# Patient Record
Sex: Female | Born: 1935
Health system: Southern US, Community
[De-identification: ages and names within clinical notes are randomized; demographics above are authoritative.]

## PROBLEM LIST (undated history)

## (undated) DIAGNOSIS — I4891 Unspecified atrial fibrillation: Secondary | ICD-10-CM

## (undated) DIAGNOSIS — K279 Peptic ulcer, site unspecified, unspecified as acute or chronic, without hemorrhage or perforation: Secondary | ICD-10-CM

## (undated) DIAGNOSIS — E538 Deficiency of other specified B group vitamins: Secondary | ICD-10-CM

## (undated) DIAGNOSIS — F329 Major depressive disorder, single episode, unspecified: Secondary | ICD-10-CM

## (undated) DIAGNOSIS — M19049 Primary osteoarthritis, unspecified hand: Secondary | ICD-10-CM

## (undated) DIAGNOSIS — Z9889 Other specified postprocedural states: Secondary | ICD-10-CM

## (undated) DIAGNOSIS — J189 Pneumonia, unspecified organism: Secondary | ICD-10-CM

## (undated) DIAGNOSIS — F419 Anxiety disorder, unspecified: Secondary | ICD-10-CM

## (undated) DIAGNOSIS — R112 Nausea with vomiting, unspecified: Secondary | ICD-10-CM

## (undated) DIAGNOSIS — J449 Chronic obstructive pulmonary disease, unspecified: Secondary | ICD-10-CM

## (undated) DIAGNOSIS — E559 Vitamin D deficiency, unspecified: Secondary | ICD-10-CM

## (undated) DIAGNOSIS — I10 Essential (primary) hypertension: Secondary | ICD-10-CM

## (undated) DIAGNOSIS — M1990 Unspecified osteoarthritis, unspecified site: Secondary | ICD-10-CM

## (undated) DIAGNOSIS — F32A Depression, unspecified: Secondary | ICD-10-CM

## (undated) DIAGNOSIS — K219 Gastro-esophageal reflux disease without esophagitis: Secondary | ICD-10-CM

## (undated) HISTORY — DX: Deficiency of other specified B group vitamins: E53.8

## (undated) HISTORY — DX: Pneumonia, unspecified organism: J18.9

## (undated) HISTORY — DX: Chronic obstructive pulmonary disease, unspecified: J44.9

## (undated) HISTORY — PX: BLADDER SURGERY: SHX569

## (undated) HISTORY — DX: Depression, unspecified: F32.A

## (undated) HISTORY — DX: Unspecified atrial fibrillation: I48.91

## (undated) HISTORY — DX: Anxiety disorder, unspecified: F41.9

## (undated) HISTORY — DX: Gastro-esophageal reflux disease without esophagitis: K21.9

## (undated) HISTORY — DX: Primary osteoarthritis, unspecified hand: M19.049

## (undated) HISTORY — PX: SPLENECTOMY: SUR1306

## (undated) HISTORY — PX: CYSTOCELE REPAIR: SHX163

## (undated) HISTORY — DX: Essential (primary) hypertension: I10

## (undated) HISTORY — DX: Vitamin D deficiency, unspecified: E55.9

## (undated) HISTORY — DX: Major depressive disorder, single episode, unspecified: F32.9

## (undated) HISTORY — DX: Unspecified osteoarthritis, unspecified site: M19.90

## (undated) HISTORY — DX: Peptic ulcer, site unspecified, unspecified as acute or chronic, without hemorrhage or perforation: K27.9

---

## 1998-07-17 ENCOUNTER — Emergency Department (HOSPITAL_COMMUNITY): Admission: EM | Admit: 1998-07-17 | Discharge: 1998-07-17 | Payer: Self-pay | Admitting: Emergency Medicine

## 1998-07-18 ENCOUNTER — Encounter: Payer: Self-pay | Admitting: Emergency Medicine

## 2001-04-25 ENCOUNTER — Ambulatory Visit (HOSPITAL_COMMUNITY): Admission: RE | Admit: 2001-04-25 | Discharge: 2001-04-25 | Payer: Self-pay | Admitting: Gastroenterology

## 2002-01-12 ENCOUNTER — Emergency Department (HOSPITAL_COMMUNITY): Admission: EM | Admit: 2002-01-12 | Discharge: 2002-01-12 | Payer: Self-pay | Admitting: Emergency Medicine

## 2002-08-01 ENCOUNTER — Inpatient Hospital Stay (HOSPITAL_COMMUNITY): Admission: AD | Admit: 2002-08-01 | Discharge: 2002-08-04 | Payer: Self-pay | Admitting: Internal Medicine

## 2002-08-14 ENCOUNTER — Encounter: Payer: Self-pay | Admitting: Emergency Medicine

## 2002-08-14 ENCOUNTER — Observation Stay (HOSPITAL_COMMUNITY): Admission: EM | Admit: 2002-08-14 | Discharge: 2002-08-15 | Payer: Self-pay | Admitting: Emergency Medicine

## 2002-10-21 ENCOUNTER — Encounter (INDEPENDENT_AMBULATORY_CARE_PROVIDER_SITE_OTHER): Payer: Self-pay

## 2002-10-21 ENCOUNTER — Inpatient Hospital Stay (HOSPITAL_COMMUNITY): Admission: RE | Admit: 2002-10-21 | Discharge: 2002-10-26 | Payer: Self-pay | Admitting: General Surgery

## 2003-09-19 ENCOUNTER — Emergency Department (HOSPITAL_COMMUNITY): Admission: EM | Admit: 2003-09-19 | Discharge: 2003-09-19 | Payer: Self-pay | Admitting: Family Medicine

## 2003-09-28 ENCOUNTER — Emergency Department (HOSPITAL_COMMUNITY): Admission: EM | Admit: 2003-09-28 | Discharge: 2003-09-28 | Payer: Self-pay | Admitting: Family Medicine

## 2004-02-02 ENCOUNTER — Ambulatory Visit: Payer: Self-pay | Admitting: Cardiology

## 2004-02-05 ENCOUNTER — Ambulatory Visit: Payer: Self-pay | Admitting: Internal Medicine

## 2004-02-09 ENCOUNTER — Ambulatory Visit (HOSPITAL_COMMUNITY): Admission: RE | Admit: 2004-02-09 | Discharge: 2004-02-09 | Payer: Self-pay | Admitting: Internal Medicine

## 2004-02-15 ENCOUNTER — Ambulatory Visit: Payer: Self-pay

## 2004-03-11 ENCOUNTER — Ambulatory Visit: Payer: Self-pay | Admitting: Internal Medicine

## 2004-05-09 ENCOUNTER — Ambulatory Visit: Payer: Self-pay | Admitting: Oncology

## 2004-07-01 ENCOUNTER — Ambulatory Visit: Payer: Self-pay | Admitting: Internal Medicine

## 2004-07-04 ENCOUNTER — Ambulatory Visit: Payer: Self-pay | Admitting: Internal Medicine

## 2004-08-08 ENCOUNTER — Ambulatory Visit: Payer: Self-pay | Admitting: Oncology

## 2004-11-04 ENCOUNTER — Ambulatory Visit: Payer: Self-pay | Admitting: Oncology

## 2004-11-15 ENCOUNTER — Ambulatory Visit: Payer: Self-pay | Admitting: Gastroenterology

## 2004-11-21 ENCOUNTER — Encounter: Admission: RE | Admit: 2004-11-21 | Discharge: 2004-11-21 | Payer: Self-pay | Admitting: *Deleted

## 2004-11-29 ENCOUNTER — Ambulatory Visit: Payer: Self-pay | Admitting: Internal Medicine

## 2005-01-10 ENCOUNTER — Ambulatory Visit: Payer: Self-pay | Admitting: Internal Medicine

## 2005-01-15 ENCOUNTER — Emergency Department (HOSPITAL_COMMUNITY): Admission: EM | Admit: 2005-01-15 | Discharge: 2005-01-15 | Payer: Self-pay | Admitting: *Deleted

## 2005-01-23 ENCOUNTER — Ambulatory Visit: Payer: Self-pay | Admitting: Internal Medicine

## 2005-01-25 ENCOUNTER — Ambulatory Visit (HOSPITAL_COMMUNITY): Admission: RE | Admit: 2005-01-25 | Discharge: 2005-01-25 | Payer: Self-pay | Admitting: Internal Medicine

## 2005-01-30 ENCOUNTER — Ambulatory Visit: Payer: Self-pay | Admitting: Internal Medicine

## 2005-01-30 ENCOUNTER — Inpatient Hospital Stay (HOSPITAL_COMMUNITY): Admission: AD | Admit: 2005-01-30 | Discharge: 2005-02-02 | Payer: Self-pay | Admitting: Internal Medicine

## 2005-01-31 ENCOUNTER — Ambulatory Visit: Payer: Self-pay | Admitting: Gastroenterology

## 2005-01-31 ENCOUNTER — Ambulatory Visit: Payer: Self-pay | Admitting: Internal Medicine

## 2005-02-01 ENCOUNTER — Encounter (INDEPENDENT_AMBULATORY_CARE_PROVIDER_SITE_OTHER): Payer: Self-pay | Admitting: Specialist

## 2005-02-06 ENCOUNTER — Ambulatory Visit: Payer: Self-pay | Admitting: Oncology

## 2005-02-13 ENCOUNTER — Ambulatory Visit: Payer: Self-pay | Admitting: Internal Medicine

## 2005-03-07 ENCOUNTER — Ambulatory Visit: Payer: Self-pay | Admitting: Gastroenterology

## 2005-03-16 ENCOUNTER — Ambulatory Visit: Payer: Self-pay | Admitting: Cardiology

## 2005-04-11 ENCOUNTER — Ambulatory Visit: Payer: Self-pay | Admitting: Internal Medicine

## 2005-05-08 ENCOUNTER — Ambulatory Visit: Payer: Self-pay | Admitting: Oncology

## 2005-05-29 ENCOUNTER — Ambulatory Visit (HOSPITAL_COMMUNITY): Admission: RE | Admit: 2005-05-29 | Discharge: 2005-05-29 | Payer: Self-pay | Admitting: Occupational Medicine

## 2005-05-29 ENCOUNTER — Encounter: Admission: RE | Admit: 2005-05-29 | Discharge: 2005-05-29 | Payer: Self-pay | Admitting: Occupational Medicine

## 2005-05-30 ENCOUNTER — Ambulatory Visit: Payer: Self-pay | Admitting: Gastroenterology

## 2005-06-15 ENCOUNTER — Other Ambulatory Visit: Admission: RE | Admit: 2005-06-15 | Discharge: 2005-06-15 | Payer: Self-pay | Admitting: Obstetrics and Gynecology

## 2005-07-14 ENCOUNTER — Ambulatory Visit: Payer: Self-pay | Admitting: Gynecology

## 2005-07-17 ENCOUNTER — Ambulatory Visit: Payer: Self-pay | Admitting: Cardiology

## 2005-08-29 ENCOUNTER — Encounter (INDEPENDENT_AMBULATORY_CARE_PROVIDER_SITE_OTHER): Payer: Self-pay | Admitting: *Deleted

## 2005-08-29 ENCOUNTER — Ambulatory Visit: Payer: Self-pay | Admitting: Gynecology

## 2005-08-29 ENCOUNTER — Inpatient Hospital Stay (HOSPITAL_COMMUNITY): Admission: RE | Admit: 2005-08-29 | Discharge: 2005-08-31 | Payer: Self-pay | Admitting: Gynecology

## 2005-08-29 ENCOUNTER — Ambulatory Visit: Payer: Self-pay | Admitting: Physician Assistant

## 2005-09-03 ENCOUNTER — Ambulatory Visit: Payer: Self-pay | Admitting: Emergency Medicine

## 2005-09-03 ENCOUNTER — Inpatient Hospital Stay (HOSPITAL_COMMUNITY): Admission: EM | Admit: 2005-09-03 | Discharge: 2005-09-26 | Payer: Self-pay | Admitting: Emergency Medicine

## 2005-09-03 ENCOUNTER — Ambulatory Visit: Payer: Self-pay | Admitting: Gastroenterology

## 2005-09-03 ENCOUNTER — Ambulatory Visit: Payer: Self-pay | Admitting: Internal Medicine

## 2005-09-08 ENCOUNTER — Encounter: Payer: Self-pay | Admitting: Cardiology

## 2005-09-12 ENCOUNTER — Ambulatory Visit: Payer: Self-pay | Admitting: Gastroenterology

## 2005-09-25 ENCOUNTER — Ambulatory Visit: Payer: Self-pay | Admitting: Physical Medicine & Rehabilitation

## 2005-10-24 ENCOUNTER — Ambulatory Visit: Payer: Self-pay | Admitting: Internal Medicine

## 2005-11-10 ENCOUNTER — Ambulatory Visit: Payer: Self-pay | Admitting: Gynecology

## 2005-11-15 ENCOUNTER — Ambulatory Visit: Payer: Self-pay | Admitting: Internal Medicine

## 2005-12-19 ENCOUNTER — Ambulatory Visit: Payer: Self-pay | Admitting: Internal Medicine

## 2006-01-19 ENCOUNTER — Ambulatory Visit: Payer: Self-pay | Admitting: Internal Medicine

## 2006-02-09 ENCOUNTER — Ambulatory Visit: Payer: Self-pay | Admitting: Internal Medicine

## 2006-02-16 ENCOUNTER — Ambulatory Visit: Payer: Self-pay | Admitting: Internal Medicine

## 2006-03-16 ENCOUNTER — Ambulatory Visit: Payer: Self-pay | Admitting: Cardiology

## 2006-03-16 LAB — CONVERTED CEMR LAB: Pro B Natriuretic peptide (BNP): 46 pg/mL (ref 0.0–100.0)

## 2006-03-28 ENCOUNTER — Ambulatory Visit: Payer: Self-pay | Admitting: Internal Medicine

## 2006-03-28 LAB — CONVERTED CEMR LAB
Bilirubin Urine: NEGATIVE
Hemoglobin, Urine: NEGATIVE
Ketones, ur: NEGATIVE mg/dL
Leukocytes, UA: NEGATIVE
Nitrite: NEGATIVE
Specific Gravity, Urine: 1.02 (ref 1.000–1.03)
Total Protein, Urine: NEGATIVE mg/dL
Urine Glucose: NEGATIVE mg/dL
Urobilinogen, UA: 0.2 (ref 0.0–1.0)
pH: 6 (ref 5.0–8.0)

## 2006-05-24 ENCOUNTER — Ambulatory Visit: Payer: Self-pay | Admitting: Internal Medicine

## 2006-07-24 ENCOUNTER — Emergency Department (HOSPITAL_COMMUNITY): Admission: EM | Admit: 2006-07-24 | Discharge: 2006-07-24 | Payer: Self-pay | Admitting: Family Medicine

## 2006-08-01 ENCOUNTER — Ambulatory Visit: Payer: Self-pay | Admitting: Internal Medicine

## 2006-08-07 DIAGNOSIS — J449 Chronic obstructive pulmonary disease, unspecified: Secondary | ICD-10-CM | POA: Insufficient documentation

## 2006-09-12 ENCOUNTER — Ambulatory Visit: Payer: Self-pay | Admitting: Internal Medicine

## 2006-09-12 LAB — CONVERTED CEMR LAB
ALT: 13 units/L (ref 0–35)
AST: 20 units/L (ref 0–37)
Albumin: 3.4 g/dL — ABNORMAL LOW (ref 3.5–5.2)
Alkaline Phosphatase: 61 units/L (ref 39–117)
BUN: 17 mg/dL (ref 6–23)
Basophils Absolute: 0 10*3/uL (ref 0.0–0.1)
Basophils Relative: 0.3 % (ref 0.0–1.0)
Bilirubin, Direct: 0.1 mg/dL (ref 0.0–0.3)
CO2: 25 meq/L (ref 19–32)
Calcium: 9.1 mg/dL (ref 8.4–10.5)
Carbamazepine Lvl: <0.3 ug/mL — ABNORMAL LOW (ref 4.0–12.0)
Chloride: 116 meq/L — ABNORMAL HIGH (ref 96–112)
Creatinine, Ser: 0.9 mg/dL (ref 0.4–1.2)
Eosinophils Absolute: 0.3 10*3/uL (ref 0.0–0.6)
Eosinophils Relative: 2.2 % (ref 0.0–5.0)
GFR calc Af Amer: 80 mL/min
GFR calc non Af Amer: 66 mL/min
Glucose, Bld: 100 mg/dL — ABNORMAL HIGH (ref 70–99)
HCT: 39.7 % (ref 36.0–46.0)
Hemoglobin: 13.4 g/dL (ref 12.0–15.0)
Lymphocytes Relative: 22.6 % (ref 12.0–46.0)
MCHC: 33.7 g/dL (ref 30.0–36.0)
MCV: 92.9 fL (ref 78.0–100.0)
Monocytes Absolute: 1.1 10*3/uL — ABNORMAL HIGH (ref 0.2–0.7)
Monocytes Relative: 9 % (ref 3.0–11.0)
Neutro Abs: 7.9 10*3/uL — ABNORMAL HIGH (ref 1.4–7.7)
Neutrophils Relative %: 65.9 % (ref 43.0–77.0)
Platelets: 496 10*3/uL — ABNORMAL HIGH (ref 150–400)
Potassium: 4 meq/L (ref 3.5–5.1)
RBC: 4.27 M/uL (ref 3.87–5.11)
RDW: 15.5 % — ABNORMAL HIGH (ref 11.5–14.6)
Sodium: 141 meq/L (ref 135–145)
TSH: 0.7 microintl units/mL (ref 0.35–5.50)
Theophylline Lvl: 0.6 ug/mL — ABNORMAL LOW (ref 10.0–20.0)
Total Bilirubin: 0.4 mg/dL (ref 0.3–1.2)
Total Protein: 6.3 g/dL (ref 6.0–8.3)
Vit D, 1,25-Dihydroxy: 27 (ref 20–57)
WBC: 12 10*3/uL — ABNORMAL HIGH (ref 4.5–10.5)

## 2006-10-19 ENCOUNTER — Ambulatory Visit: Payer: Self-pay | Admitting: Internal Medicine

## 2006-11-26 ENCOUNTER — Ambulatory Visit: Payer: Self-pay | Admitting: Internal Medicine

## 2007-02-18 ENCOUNTER — Encounter: Payer: Self-pay | Admitting: Internal Medicine

## 2007-02-19 ENCOUNTER — Encounter: Payer: Self-pay | Admitting: Internal Medicine

## 2007-02-19 DIAGNOSIS — E559 Vitamin D deficiency, unspecified: Secondary | ICD-10-CM | POA: Insufficient documentation

## 2007-02-19 DIAGNOSIS — K219 Gastro-esophageal reflux disease without esophagitis: Secondary | ICD-10-CM | POA: Insufficient documentation

## 2007-02-26 ENCOUNTER — Ambulatory Visit: Payer: Self-pay | Admitting: Internal Medicine

## 2007-02-26 DIAGNOSIS — I4891 Unspecified atrial fibrillation: Secondary | ICD-10-CM

## 2007-02-26 DIAGNOSIS — I4819 Other persistent atrial fibrillation: Secondary | ICD-10-CM | POA: Insufficient documentation

## 2007-02-26 DIAGNOSIS — I480 Paroxysmal atrial fibrillation: Secondary | ICD-10-CM | POA: Insufficient documentation

## 2007-02-26 LAB — CONVERTED CEMR LAB
ALT: 14 units/L (ref 0–35)
AST: 23 units/L (ref 0–37)
Albumin: 3.8 g/dL (ref 3.5–5.2)
Alkaline Phosphatase: 62 units/L (ref 39–117)
BUN: 16 mg/dL (ref 6–23)
Bilirubin, Direct: 0.2 mg/dL (ref 0.0–0.3)
CO2: 25 meq/L (ref 19–32)
Calcium: 9.3 mg/dL (ref 8.4–10.5)
Chloride: 105 meq/L (ref 96–112)
Cholesterol: 182 mg/dL (ref 0–200)
Creatinine, Ser: 0.8 mg/dL (ref 0.4–1.2)
GFR calc Af Amer: 91 mL/min
GFR calc non Af Amer: 76 mL/min
Glucose, Bld: 121 mg/dL — ABNORMAL HIGH (ref 70–99)
HDL: 46 mg/dL (ref >39.0)
LDL Cholesterol: 117 mg/dL — ABNORMAL HIGH (ref 0–99)
Potassium: 4.2 meq/L (ref 3.5–5.1)
Sodium: 140 meq/L (ref 135–145)
TSH: 0.97 microintl units/mL (ref 0.35–5.50)
Total Bilirubin: 0.5 mg/dL (ref 0.3–1.2)
Total CHOL/HDL Ratio: 4
Total Protein: 6.1 g/dL (ref 6.0–8.3)
Triglycerides: 96 mg/dL (ref 0–149)
VLDL: 19 mg/dL (ref 0–40)

## 2007-03-01 ENCOUNTER — Ambulatory Visit: Payer: Self-pay | Admitting: Internal Medicine

## 2007-03-01 DIAGNOSIS — F411 Generalized anxiety disorder: Secondary | ICD-10-CM | POA: Insufficient documentation

## 2007-03-01 DIAGNOSIS — F329 Major depressive disorder, single episode, unspecified: Secondary | ICD-10-CM | POA: Insufficient documentation

## 2007-04-03 ENCOUNTER — Ambulatory Visit: Payer: Self-pay | Admitting: Cardiology

## 2007-05-20 ENCOUNTER — Encounter: Payer: Self-pay | Admitting: Internal Medicine

## 2007-05-27 ENCOUNTER — Encounter: Admission: RE | Admit: 2007-05-27 | Discharge: 2007-05-27 | Payer: Self-pay | Admitting: Internal Medicine

## 2007-06-20 ENCOUNTER — Encounter: Payer: Self-pay | Admitting: Internal Medicine

## 2007-07-31 ENCOUNTER — Ambulatory Visit: Payer: Self-pay | Admitting: Internal Medicine

## 2007-07-31 DIAGNOSIS — G4700 Insomnia, unspecified: Secondary | ICD-10-CM | POA: Insufficient documentation

## 2007-08-12 ENCOUNTER — Encounter: Payer: Self-pay | Admitting: Internal Medicine

## 2007-08-20 ENCOUNTER — Telehealth: Payer: Self-pay | Admitting: Internal Medicine

## 2007-08-26 ENCOUNTER — Telehealth (INDEPENDENT_AMBULATORY_CARE_PROVIDER_SITE_OTHER): Payer: Self-pay | Admitting: *Deleted

## 2007-09-12 ENCOUNTER — Telehealth: Payer: Self-pay | Admitting: Internal Medicine

## 2007-10-02 ENCOUNTER — Telehealth: Payer: Self-pay | Admitting: Internal Medicine

## 2007-10-04 ENCOUNTER — Telehealth (INDEPENDENT_AMBULATORY_CARE_PROVIDER_SITE_OTHER): Payer: Self-pay | Admitting: *Deleted

## 2007-10-09 ENCOUNTER — Ambulatory Visit: Payer: Self-pay | Admitting: Internal Medicine

## 2007-10-11 LAB — CONVERTED CEMR LAB
ALT: 14 units/L (ref 0–35)
AST: 24 units/L (ref 0–37)
Albumin: 3.5 g/dL (ref 3.5–5.2)
Alkaline Phosphatase: 70 units/L (ref 39–117)
BUN: 14 mg/dL (ref 6–23)
Basophils Absolute: 0 10*3/uL (ref 0.0–0.1)
Basophils Relative: 0 % (ref 0.0–3.0)
Bilirubin, Direct: 0.1 mg/dL (ref 0.0–0.3)
CO2: 25 meq/L (ref 19–32)
Calcium: 9.3 mg/dL (ref 8.4–10.5)
Chloride: 107 meq/L (ref 96–112)
Creatinine, Ser: 0.8 mg/dL (ref 0.4–1.2)
Eosinophils Absolute: 0.4 10*3/uL (ref 0.0–0.7)
Eosinophils Relative: 4.8 % (ref 0.0–5.0)
Folate: 8.2 ng/mL
GFR calc Af Amer: 91 mL/min
GFR calc non Af Amer: 76 mL/min
Glucose, Bld: 70 mg/dL (ref 70–99)
HCT: 37.8 % (ref 36.0–46.0)
Hemoglobin: 12.7 g/dL (ref 12.0–15.0)
Lymphocytes Relative: 30.9 % (ref 12.0–46.0)
MCHC: 33.6 g/dL (ref 30.0–36.0)
MCV: 92.6 fL (ref 78.0–100.0)
Monocytes Absolute: 0.9 10*3/uL (ref 0.1–1.0)
Monocytes Relative: 9.9 % (ref 3.0–12.0)
Neutro Abs: 4.8 10*3/uL (ref 1.4–7.7)
Neutrophils Relative %: 54.4 % (ref 43.0–77.0)
Platelets: 401 10*3/uL — ABNORMAL HIGH (ref 150–400)
Potassium: 4.1 meq/L (ref 3.5–5.1)
RBC: 4.08 M/uL (ref 3.87–5.11)
RDW: 14.2 % (ref 11.5–14.6)
Sodium: 147 meq/L — ABNORMAL HIGH (ref 135–145)
TSH: 2.79 microintl units/mL (ref 0.35–5.50)
Total Bilirubin: 0.5 mg/dL (ref 0.3–1.2)
Total Protein: 6.5 g/dL (ref 6.0–8.3)
Vitamin B-12: 189 pg/mL — ABNORMAL LOW (ref 211–911)
WBC: 8.9 10*3/uL (ref 4.5–10.5)

## 2007-10-14 ENCOUNTER — Ambulatory Visit: Payer: Self-pay | Admitting: Internal Medicine

## 2007-10-14 DIAGNOSIS — E538 Deficiency of other specified B group vitamins: Secondary | ICD-10-CM | POA: Insufficient documentation

## 2007-10-14 DIAGNOSIS — R5381 Other malaise: Secondary | ICD-10-CM | POA: Insufficient documentation

## 2007-11-28 ENCOUNTER — Ambulatory Visit: Payer: Self-pay | Admitting: Cardiology

## 2007-11-28 ENCOUNTER — Inpatient Hospital Stay (HOSPITAL_COMMUNITY): Admission: EM | Admit: 2007-11-28 | Discharge: 2007-11-29 | Payer: Self-pay | Admitting: Emergency Medicine

## 2007-12-09 ENCOUNTER — Ambulatory Visit: Payer: Self-pay | Admitting: Internal Medicine

## 2007-12-09 LAB — CONVERTED CEMR LAB: Vit D, 1,25-Dihydroxy: 42 (ref 30–89)

## 2007-12-10 ENCOUNTER — Ambulatory Visit: Payer: Self-pay | Admitting: Internal Medicine

## 2007-12-10 ENCOUNTER — Ambulatory Visit: Payer: Self-pay

## 2007-12-10 ENCOUNTER — Encounter: Payer: Self-pay | Admitting: Cardiology

## 2007-12-10 LAB — CONVERTED CEMR LAB
BUN: 17 mg/dL (ref 6–23)
CO2: 24 meq/L (ref 19–32)
Calcium: 8.7 mg/dL (ref 8.4–10.5)
Chloride: 110 meq/L (ref 96–112)
Creatinine, Ser: 0.8 mg/dL (ref 0.4–1.2)
GFR calc Af Amer: 91 mL/min
GFR calc non Af Amer: 76 mL/min
Glucose, Bld: 94 mg/dL (ref 70–99)
Potassium: 3.8 meq/L (ref 3.5–5.1)
Sodium: 148 meq/L — ABNORMAL HIGH (ref 135–145)
Vitamin B-12: >1500 pg/mL — ABNORMAL HIGH (ref 211–911)

## 2007-12-23 ENCOUNTER — Ambulatory Visit: Payer: Self-pay | Admitting: Internal Medicine

## 2007-12-23 DIAGNOSIS — H9190 Unspecified hearing loss, unspecified ear: Secondary | ICD-10-CM | POA: Insufficient documentation

## 2008-01-08 ENCOUNTER — Encounter: Payer: Self-pay | Admitting: Internal Medicine

## 2008-01-13 ENCOUNTER — Telehealth: Payer: Self-pay | Admitting: Internal Medicine

## 2008-01-17 ENCOUNTER — Ambulatory Visit: Payer: Self-pay | Admitting: Cardiology

## 2008-01-21 ENCOUNTER — Ambulatory Visit: Payer: Self-pay | Admitting: Cardiovascular Disease

## 2008-01-21 ENCOUNTER — Ambulatory Visit: Payer: Self-pay

## 2008-01-25 ENCOUNTER — Emergency Department (HOSPITAL_COMMUNITY): Admission: EM | Admit: 2008-01-25 | Discharge: 2008-01-25 | Payer: Self-pay | Admitting: Emergency Medicine

## 2008-01-29 ENCOUNTER — Ambulatory Visit: Payer: Self-pay | Admitting: Cardiovascular Disease

## 2008-02-06 ENCOUNTER — Ambulatory Visit: Payer: Self-pay | Admitting: Cardiology

## 2008-02-20 ENCOUNTER — Telehealth: Payer: Self-pay | Admitting: Internal Medicine

## 2008-03-05 ENCOUNTER — Telehealth: Payer: Self-pay | Admitting: Internal Medicine

## 2008-03-23 ENCOUNTER — Ambulatory Visit: Payer: Self-pay | Admitting: Internal Medicine

## 2008-03-23 DIAGNOSIS — R55 Syncope and collapse: Secondary | ICD-10-CM | POA: Insufficient documentation

## 2008-03-23 DIAGNOSIS — R634 Abnormal weight loss: Secondary | ICD-10-CM | POA: Insufficient documentation

## 2008-03-25 LAB — CONVERTED CEMR LAB
ALT: 15 units/L (ref 0–35)
AST: 24 units/L (ref 0–37)
Albumin: 3.6 g/dL (ref 3.5–5.2)
Alkaline Phosphatase: 64 units/L (ref 39–117)
BUN: 15 mg/dL (ref 6–23)
Bilirubin Urine: NEGATIVE
Bilirubin, Direct: 0.1 mg/dL (ref 0.0–0.3)
CO2: 29 meq/L (ref 19–32)
Calcium: 8.9 mg/dL (ref 8.4–10.5)
Chloride: 111 meq/L (ref 96–112)
Creatinine, Ser: 0.8 mg/dL (ref 0.4–1.2)
GFR calc Af Amer: 91 mL/min
GFR calc non Af Amer: 75 mL/min
Glucose, Bld: 88 mg/dL (ref 70–99)
Hemoglobin, Urine: NEGATIVE
Ketones, ur: NEGATIVE mg/dL
Leukocytes, UA: NEGATIVE
Nitrite: NEGATIVE
Potassium: 4.3 meq/L (ref 3.5–5.1)
Sodium: 147 meq/L — ABNORMAL HIGH (ref 135–145)
Specific Gravity, Urine: 1.025 (ref 1.000–1.03)
TSH: 0.86 microintl units/mL (ref 0.35–5.50)
Total Bilirubin: 0.7 mg/dL (ref 0.3–1.2)
Total Protein, Urine: NEGATIVE mg/dL
Total Protein: 6.5 g/dL (ref 6.0–8.3)
Urine Glucose: NEGATIVE mg/dL
Urobilinogen, UA: 0.2 (ref 0.0–1.0)
Vitamin B-12: 1115 pg/mL — ABNORMAL HIGH (ref 211–911)
pH: 5.5 (ref 5.0–8.0)

## 2008-03-31 ENCOUNTER — Telehealth (INDEPENDENT_AMBULATORY_CARE_PROVIDER_SITE_OTHER): Payer: Self-pay | Admitting: *Deleted

## 2008-04-01 ENCOUNTER — Ambulatory Visit: Payer: Self-pay | Admitting: Internal Medicine

## 2008-04-17 ENCOUNTER — Telehealth: Payer: Self-pay | Admitting: Internal Medicine

## 2008-04-19 ENCOUNTER — Emergency Department (HOSPITAL_COMMUNITY): Admission: EM | Admit: 2008-04-19 | Discharge: 2008-04-19 | Payer: Self-pay | Admitting: Emergency Medicine

## 2008-05-11 ENCOUNTER — Emergency Department (HOSPITAL_COMMUNITY): Admission: EM | Admit: 2008-05-11 | Discharge: 2008-05-11 | Payer: Self-pay | Admitting: Emergency Medicine

## 2008-05-25 ENCOUNTER — Encounter (INDEPENDENT_AMBULATORY_CARE_PROVIDER_SITE_OTHER): Payer: Self-pay | Admitting: *Deleted

## 2008-05-25 ENCOUNTER — Encounter: Payer: Self-pay | Admitting: Cardiology

## 2008-05-25 ENCOUNTER — Ambulatory Visit: Payer: Self-pay | Admitting: Internal Medicine

## 2008-05-25 DIAGNOSIS — R519 Headache, unspecified: Secondary | ICD-10-CM | POA: Insufficient documentation

## 2008-05-25 DIAGNOSIS — I498 Other specified cardiac arrhythmias: Secondary | ICD-10-CM | POA: Insufficient documentation

## 2008-05-25 DIAGNOSIS — R51 Headache: Secondary | ICD-10-CM | POA: Insufficient documentation

## 2008-05-26 LAB — CONVERTED CEMR LAB
ALT: 14 units/L (ref 0–35)
AST: 26 units/L (ref 0–37)
Albumin: 3.8 g/dL (ref 3.5–5.2)
Alkaline Phosphatase: 54 units/L (ref 39–117)
BUN: 17 mg/dL (ref 6–23)
Basophils Absolute: 0.1 10*3/uL (ref 0.0–0.1)
Basophils Relative: 0.9 % (ref 0.0–3.0)
Bilirubin Urine: NEGATIVE
Bilirubin, Direct: 0.1 mg/dL (ref 0.0–0.3)
CO2: 28 meq/L (ref 19–32)
Calcium: 9.1 mg/dL (ref 8.4–10.5)
Chloride: 111 meq/L (ref 96–112)
Creatinine, Ser: 0.8 mg/dL (ref 0.4–1.2)
Eosinophils Absolute: 0.3 10*3/uL (ref 0.0–0.7)
Eosinophils Relative: 3.6 % (ref 0.0–5.0)
GFR calc non Af Amer: 75.29 mL/min (ref >60)
Glucose, Bld: 85 mg/dL (ref 70–99)
HCT: 37.8 % (ref 36.0–46.0)
Hemoglobin, Urine: NEGATIVE
Hemoglobin: 12.6 g/dL (ref 12.0–15.0)
Ketones, ur: NEGATIVE mg/dL
Leukocytes, UA: NEGATIVE
Lymphocytes Relative: 24.3 % (ref 12.0–46.0)
Lymphs Abs: 2.1 10*3/uL (ref 0.7–4.0)
MCHC: 33.2 g/dL (ref 30.0–36.0)
MCV: 88.9 fL (ref 78.0–100.0)
Monocytes Absolute: 0.7 10*3/uL (ref 0.1–1.0)
Monocytes Relative: 7.9 % (ref 3.0–12.0)
Neutro Abs: 5.5 10*3/uL (ref 1.4–7.7)
Neutrophils Relative %: 63.3 % (ref 43.0–77.0)
Nitrite: NEGATIVE
Platelets: 417 10*3/uL — ABNORMAL HIGH (ref 150.0–400.0)
Potassium: 5.5 meq/L — ABNORMAL HIGH (ref 3.5–5.1)
RBC: 4.26 M/uL (ref 3.87–5.11)
RDW: 16.1 % — ABNORMAL HIGH (ref 11.5–14.6)
Sed Rate: 11 mm/hr (ref 0–22)
Sodium: 148 meq/L — ABNORMAL HIGH (ref 135–145)
Specific Gravity, Urine: >=1.03 (ref 1.000–1.030)
TSH: 1.13 microintl units/mL (ref 0.35–5.50)
Total Bilirubin: 0.5 mg/dL (ref 0.3–1.2)
Total Protein, Urine: NEGATIVE mg/dL
Total Protein: 6.6 g/dL (ref 6.0–8.3)
Urine Glucose: NEGATIVE mg/dL
Urobilinogen, UA: 0.2 (ref 0.0–1.0)
Vitamin B-12: 586 pg/mL (ref 211–911)
WBC: 8.7 10*3/uL (ref 4.5–10.5)
pH: 5 (ref 5.0–8.0)

## 2008-06-02 ENCOUNTER — Encounter: Admission: RE | Admit: 2008-06-02 | Discharge: 2008-06-02 | Payer: Self-pay | Admitting: Internal Medicine

## 2008-06-22 ENCOUNTER — Encounter: Payer: Self-pay | Admitting: Internal Medicine

## 2008-06-25 ENCOUNTER — Telehealth: Payer: Self-pay | Admitting: Cardiology

## 2008-06-29 ENCOUNTER — Ambulatory Visit: Payer: Self-pay | Admitting: Internal Medicine

## 2008-07-15 DIAGNOSIS — I10 Essential (primary) hypertension: Secondary | ICD-10-CM | POA: Insufficient documentation

## 2008-07-15 DIAGNOSIS — J189 Pneumonia, unspecified organism: Secondary | ICD-10-CM | POA: Insufficient documentation

## 2008-07-15 DIAGNOSIS — J438 Other emphysema: Secondary | ICD-10-CM | POA: Insufficient documentation

## 2008-07-15 DIAGNOSIS — K279 Peptic ulcer, site unspecified, unspecified as acute or chronic, without hemorrhage or perforation: Secondary | ICD-10-CM | POA: Insufficient documentation

## 2008-07-15 DIAGNOSIS — M129 Arthropathy, unspecified: Secondary | ICD-10-CM | POA: Insufficient documentation

## 2008-07-21 ENCOUNTER — Telehealth: Payer: Self-pay | Admitting: Internal Medicine

## 2008-07-21 ENCOUNTER — Ambulatory Visit: Payer: Self-pay | Admitting: Cardiology

## 2008-07-24 ENCOUNTER — Ambulatory Visit: Payer: Self-pay

## 2008-07-24 ENCOUNTER — Encounter: Payer: Self-pay | Admitting: Cardiology

## 2008-08-04 ENCOUNTER — Encounter: Payer: Self-pay | Admitting: *Deleted

## 2008-08-12 ENCOUNTER — Ambulatory Visit: Payer: Self-pay | Admitting: Cardiology

## 2008-08-12 ENCOUNTER — Telehealth: Payer: Self-pay | Admitting: Cardiology

## 2008-08-12 ENCOUNTER — Encounter: Payer: Self-pay | Admitting: Internal Medicine

## 2008-08-13 ENCOUNTER — Inpatient Hospital Stay (HOSPITAL_COMMUNITY): Admission: EM | Admit: 2008-08-13 | Discharge: 2008-08-13 | Payer: Self-pay | Admitting: Emergency Medicine

## 2008-08-18 ENCOUNTER — Telehealth: Payer: Self-pay | Admitting: Cardiology

## 2008-08-18 ENCOUNTER — Telehealth: Payer: Self-pay | Admitting: Internal Medicine

## 2008-08-19 ENCOUNTER — Ambulatory Visit: Payer: Self-pay | Admitting: Internal Medicine

## 2008-08-19 DIAGNOSIS — R197 Diarrhea, unspecified: Secondary | ICD-10-CM | POA: Insufficient documentation

## 2008-08-19 DIAGNOSIS — R110 Nausea: Secondary | ICD-10-CM | POA: Insufficient documentation

## 2008-08-19 LAB — CONVERTED CEMR LAB
BUN: 16 mg/dL (ref 6–23)
Basophils Absolute: 0.2 10*3/uL — ABNORMAL HIGH (ref 0.0–0.1)
Basophils Relative: 1.8 % (ref 0.0–3.0)
CO2: 34 meq/L — ABNORMAL HIGH (ref 19–32)
Calcium: 9.2 mg/dL (ref 8.4–10.5)
Chloride: 104 meq/L (ref 96–112)
Creatinine, Ser: 0.7 mg/dL (ref 0.4–1.2)
Eosinophils Absolute: 0.2 10*3/uL (ref 0.0–0.7)
Eosinophils Relative: 1.2 % (ref 0.0–5.0)
GFR calc non Af Amer: 87.77 mL/min (ref >60)
Glucose, Bld: 89 mg/dL (ref 70–99)
HCT: 37.3 % (ref 36.0–46.0)
Hemoglobin: 12.3 g/dL (ref 12.0–15.0)
Lipase: 22 units/L (ref 11.0–59.0)
Lymphocytes Relative: 28.1 % (ref 12.0–46.0)
Lymphs Abs: 3.9 10*3/uL (ref 0.7–4.0)
MCHC: 33 g/dL (ref 30.0–36.0)
MCV: 89.6 fL (ref 78.0–100.0)
Monocytes Absolute: 1.5 10*3/uL — ABNORMAL HIGH (ref 0.1–1.0)
Monocytes Relative: 10.6 % (ref 3.0–12.0)
Neutro Abs: 8 10*3/uL — ABNORMAL HIGH (ref 1.4–7.7)
Neutrophils Relative %: 58.3 % (ref 43.0–77.0)
Platelets: 412 10*3/uL — ABNORMAL HIGH (ref 150.0–400.0)
Potassium: 5 meq/L (ref 3.5–5.1)
RBC: 4.17 M/uL (ref 3.87–5.11)
RDW: 16.7 % — ABNORMAL HIGH (ref 11.5–14.6)
Sodium: 146 meq/L — ABNORMAL HIGH (ref 135–145)
TSH: 1.61 microintl units/mL (ref 0.35–5.50)
WBC: 13.8 10*3/uL — ABNORMAL HIGH (ref 4.5–10.5)

## 2008-08-21 ENCOUNTER — Encounter: Payer: Self-pay | Admitting: Internal Medicine

## 2008-08-21 ENCOUNTER — Telehealth: Payer: Self-pay | Admitting: Cardiology

## 2008-08-25 ENCOUNTER — Ambulatory Visit: Payer: Self-pay | Admitting: Cardiology

## 2008-09-01 ENCOUNTER — Ambulatory Visit: Payer: Self-pay | Admitting: Internal Medicine

## 2008-09-02 ENCOUNTER — Encounter (INDEPENDENT_AMBULATORY_CARE_PROVIDER_SITE_OTHER): Payer: Self-pay | Admitting: *Deleted

## 2008-09-09 ENCOUNTER — Encounter: Payer: Self-pay | Admitting: *Deleted

## 2008-09-21 ENCOUNTER — Encounter (INDEPENDENT_AMBULATORY_CARE_PROVIDER_SITE_OTHER): Payer: Self-pay | Admitting: *Deleted

## 2008-11-06 ENCOUNTER — Ambulatory Visit: Payer: Self-pay | Admitting: Internal Medicine

## 2008-11-06 DIAGNOSIS — J309 Allergic rhinitis, unspecified: Secondary | ICD-10-CM | POA: Insufficient documentation

## 2008-11-10 LAB — CONVERTED CEMR LAB
ALT: 13 units/L (ref 0–35)
AST: 26 units/L (ref 0–37)
Albumin: 3.5 g/dL (ref 3.5–5.2)
Alkaline Phosphatase: 62 units/L (ref 39–117)
BUN: 21 mg/dL (ref 6–23)
Basophils Absolute: 0.1 10*3/uL (ref 0.0–0.1)
Basophils Relative: 0.8 % (ref 0.0–3.0)
Bilirubin, Direct: 0.1 mg/dL (ref 0.0–0.3)
CO2: 26 meq/L (ref 19–32)
Calcium: 8.9 mg/dL (ref 8.4–10.5)
Chloride: 111 meq/L (ref 96–112)
Creatinine, Ser: 1.1 mg/dL (ref 0.4–1.2)
Eosinophils Absolute: 0.5 10*3/uL (ref 0.0–0.7)
Eosinophils Relative: 5.1 % — ABNORMAL HIGH (ref 0.0–5.0)
GFR calc non Af Amer: 52.07 mL/min (ref >60)
Glucose, Bld: 80 mg/dL (ref 70–99)
HCT: 35.3 % — ABNORMAL LOW (ref 36.0–46.0)
Hemoglobin: 11.5 g/dL — ABNORMAL LOW (ref 12.0–15.0)
Lymphocytes Relative: 24.1 % (ref 12.0–46.0)
Lymphs Abs: 2.5 10*3/uL (ref 0.7–4.0)
MCHC: 32.4 g/dL (ref 30.0–36.0)
MCV: 91.3 fL (ref 78.0–100.0)
Monocytes Absolute: 1 10*3/uL (ref 0.1–1.0)
Monocytes Relative: 9.5 % (ref 3.0–12.0)
Neutro Abs: 6.1 10*3/uL (ref 1.4–7.7)
Neutrophils Relative %: 60.5 % (ref 43.0–77.0)
Platelets: 432 10*3/uL — ABNORMAL HIGH (ref 150.0–400.0)
Potassium: 4.4 meq/L (ref 3.5–5.1)
RBC: 3.87 M/uL (ref 3.87–5.11)
RDW: 16.1 % — ABNORMAL HIGH (ref 11.5–14.6)
Sodium: 144 meq/L (ref 135–145)
TSH: 1.43 microintl units/mL (ref 0.35–5.50)
Total Bilirubin: 0.6 mg/dL (ref 0.3–1.2)
Total Protein: 6.8 g/dL (ref 6.0–8.3)
Vitamin B-12: 450 pg/mL (ref 211–911)
WBC: 10.2 10*3/uL (ref 4.5–10.5)

## 2008-11-19 ENCOUNTER — Ambulatory Visit: Payer: Self-pay | Admitting: Cardiology

## 2008-12-03 ENCOUNTER — Encounter: Admission: RE | Admit: 2008-12-03 | Discharge: 2008-12-03 | Payer: Self-pay | Admitting: Internal Medicine

## 2008-12-31 ENCOUNTER — Telehealth: Payer: Self-pay | Admitting: Internal Medicine

## 2009-01-15 ENCOUNTER — Ambulatory Visit: Payer: Self-pay | Admitting: Internal Medicine

## 2009-02-10 ENCOUNTER — Telehealth: Payer: Self-pay | Admitting: Internal Medicine

## 2009-03-01 ENCOUNTER — Emergency Department (HOSPITAL_COMMUNITY): Admission: EM | Admit: 2009-03-01 | Discharge: 2009-03-01 | Payer: Self-pay | Admitting: Emergency Medicine

## 2009-03-08 ENCOUNTER — Ambulatory Visit: Payer: Self-pay | Admitting: Internal Medicine

## 2009-03-08 DIAGNOSIS — Z87891 Personal history of nicotine dependence: Secondary | ICD-10-CM | POA: Insufficient documentation

## 2009-03-08 DIAGNOSIS — I889 Nonspecific lymphadenitis, unspecified: Secondary | ICD-10-CM | POA: Insufficient documentation

## 2009-03-22 ENCOUNTER — Telehealth: Payer: Self-pay | Admitting: Internal Medicine

## 2009-03-23 ENCOUNTER — Ambulatory Visit: Payer: Self-pay | Admitting: Internal Medicine

## 2009-04-26 ENCOUNTER — Telehealth: Payer: Self-pay | Admitting: Internal Medicine

## 2009-06-15 ENCOUNTER — Ambulatory Visit: Payer: Self-pay | Admitting: Internal Medicine

## 2009-06-15 LAB — CONVERTED CEMR LAB
ALT: 12 units/L (ref 0–35)
AST: 18 units/L (ref 0–37)
Albumin: 3.3 g/dL — ABNORMAL LOW (ref 3.5–5.2)
Alkaline Phosphatase: 72 units/L (ref 39–117)
BUN: 18 mg/dL (ref 6–23)
Basophils Absolute: 0.1 10*3/uL (ref 0.0–0.1)
Basophils Relative: 0.9 % (ref 0.0–3.0)
Bilirubin, Direct: 0 mg/dL (ref 0.0–0.3)
CO2: 28 meq/L (ref 19–32)
Calcium: 9.2 mg/dL (ref 8.4–10.5)
Chloride: 109 meq/L (ref 96–112)
Creatinine, Ser: 0.9 mg/dL (ref 0.4–1.2)
Eosinophils Absolute: 0.5 10*3/uL (ref 0.0–0.7)
Eosinophils Relative: 4.7 % (ref 0.0–5.0)
GFR calc non Af Amer: 65.52 mL/min (ref >60)
Glucose, Bld: 94 mg/dL (ref 70–99)
HCT: 36.8 % (ref 36.0–46.0)
Hemoglobin: 12.1 g/dL (ref 12.0–15.0)
Lymphocytes Relative: 18 % (ref 12.0–46.0)
Lymphs Abs: 2 10*3/uL (ref 0.7–4.0)
MCHC: 32.9 g/dL (ref 30.0–36.0)
MCV: 86.6 fL (ref 78.0–100.0)
Monocytes Absolute: 1.1 10*3/uL — ABNORMAL HIGH (ref 0.1–1.0)
Monocytes Relative: 9.8 % (ref 3.0–12.0)
Neutro Abs: 7.5 10*3/uL (ref 1.4–7.7)
Neutrophils Relative %: 66.6 % (ref 43.0–77.0)
Platelets: 453 10*3/uL — ABNORMAL HIGH (ref 150.0–400.0)
Potassium: 5.4 meq/L — ABNORMAL HIGH (ref 3.5–5.1)
RBC: 4.24 M/uL (ref 3.87–5.11)
RDW: 17.1 % — ABNORMAL HIGH (ref 11.5–14.6)
Sodium: 145 meq/L (ref 135–145)
TSH: 1.32 microintl units/mL (ref 0.35–5.50)
Total Bilirubin: 0.3 mg/dL (ref 0.3–1.2)
Total Protein: 6.9 g/dL (ref 6.0–8.3)
Vitamin B-12: 907 pg/mL (ref 211–911)
WBC: 11.3 10*3/uL — ABNORMAL HIGH (ref 4.5–10.5)

## 2009-06-22 ENCOUNTER — Ambulatory Visit: Payer: Self-pay | Admitting: Internal Medicine

## 2009-06-23 LAB — CONVERTED CEMR LAB
Hemoglobin, Urine: NEGATIVE
Ketones, ur: NEGATIVE mg/dL
Leukocytes, UA: NEGATIVE
Nitrite: NEGATIVE
Specific Gravity, Urine: >=1.03 (ref 1.000–1.030)
Urine Glucose: NEGATIVE mg/dL
Urobilinogen, UA: 0.2 (ref 0.0–1.0)
pH: 5 (ref 5.0–8.0)

## 2009-07-06 ENCOUNTER — Ambulatory Visit: Payer: Self-pay | Admitting: Internal Medicine

## 2009-07-06 DIAGNOSIS — M1990 Unspecified osteoarthritis, unspecified site: Secondary | ICD-10-CM | POA: Insufficient documentation

## 2009-07-30 ENCOUNTER — Telehealth: Payer: Self-pay | Admitting: Internal Medicine

## 2009-07-31 ENCOUNTER — Emergency Department (HOSPITAL_COMMUNITY): Admission: EM | Admit: 2009-07-31 | Discharge: 2009-07-31 | Payer: Self-pay | Admitting: Family Medicine

## 2009-08-03 ENCOUNTER — Ambulatory Visit: Payer: Self-pay | Admitting: Internal Medicine

## 2009-08-03 DIAGNOSIS — J209 Acute bronchitis, unspecified: Secondary | ICD-10-CM | POA: Insufficient documentation

## 2009-08-05 ENCOUNTER — Telehealth: Payer: Self-pay | Admitting: Internal Medicine

## 2009-08-06 ENCOUNTER — Encounter: Payer: Self-pay | Admitting: Internal Medicine

## 2009-08-06 ENCOUNTER — Telehealth: Payer: Self-pay | Admitting: Internal Medicine

## 2009-08-07 ENCOUNTER — Telehealth: Payer: Self-pay | Admitting: Family Medicine

## 2009-08-09 ENCOUNTER — Telehealth: Payer: Self-pay | Admitting: Internal Medicine

## 2009-08-18 ENCOUNTER — Telehealth: Payer: Self-pay | Admitting: Internal Medicine

## 2009-09-15 ENCOUNTER — Telehealth: Payer: Self-pay | Admitting: Cardiology

## 2009-09-23 ENCOUNTER — Ambulatory Visit: Payer: Self-pay | Admitting: Cardiology

## 2009-09-23 DIAGNOSIS — R0602 Shortness of breath: Secondary | ICD-10-CM | POA: Insufficient documentation

## 2009-10-11 ENCOUNTER — Ambulatory Visit: Payer: Self-pay | Admitting: Internal Medicine

## 2009-10-11 LAB — CONVERTED CEMR LAB
Anti Nuclear Antibody(ANA): NEGATIVE
BUN: 24 mg/dL — ABNORMAL HIGH (ref 6–23)
CO2: 28 meq/L (ref 19–32)
Calcium: 8.8 mg/dL (ref 8.4–10.5)
Chloride: 107 meq/L (ref 96–112)
Creatinine, Ser: 1 mg/dL (ref 0.4–1.2)
GFR calc non Af Amer: 60.04 mL/min (ref >60)
Glucose, Bld: 78 mg/dL (ref 70–99)
Potassium: 4.2 meq/L (ref 3.5–5.1)
Rhuematoid fact SerPl-aCnc: <20 intl units/mL (ref 0–20)
Sed Rate: 12 mm/hr (ref 0–22)
Sodium: 144 meq/L (ref 135–145)
TSH: 1.07 microintl units/mL (ref 0.35–5.50)

## 2009-10-14 ENCOUNTER — Ambulatory Visit: Payer: Self-pay | Admitting: Internal Medicine

## 2009-11-10 ENCOUNTER — Encounter: Admission: RE | Admit: 2009-11-10 | Discharge: 2009-11-10 | Payer: Self-pay | Admitting: Occupational Medicine

## 2009-11-11 ENCOUNTER — Telehealth: Payer: Self-pay | Admitting: Internal Medicine

## 2009-12-17 ENCOUNTER — Telehealth: Payer: Self-pay | Admitting: Internal Medicine

## 2009-12-22 ENCOUNTER — Encounter: Payer: Self-pay | Admitting: Internal Medicine

## 2009-12-29 ENCOUNTER — Telehealth: Payer: Self-pay | Admitting: Internal Medicine

## 2010-01-06 ENCOUNTER — Telehealth: Payer: Self-pay | Admitting: Internal Medicine

## 2010-01-07 ENCOUNTER — Ambulatory Visit: Payer: Self-pay | Admitting: Internal Medicine

## 2010-01-07 DIAGNOSIS — J019 Acute sinusitis, unspecified: Secondary | ICD-10-CM | POA: Insufficient documentation

## 2010-01-07 LAB — CONVERTED CEMR LAB
Basophils Absolute: 0.1 10*3/uL (ref 0.0–0.1)
Basophils Relative: 0.8 % (ref 0.0–3.0)
Eosinophils Absolute: 0.3 10*3/uL (ref 0.0–0.7)
Eosinophils Relative: 2.6 % (ref 0.0–5.0)
HCT: 35.9 % — ABNORMAL LOW (ref 36.0–46.0)
Hemoglobin: 11.8 g/dL — ABNORMAL LOW (ref 12.0–15.0)
Lymphocytes Relative: 27.3 % (ref 12.0–46.0)
Lymphs Abs: 3.1 10*3/uL (ref 0.7–4.0)
MCHC: 32.8 g/dL (ref 30.0–36.0)
MCV: 90.7 fL (ref 78.0–100.0)
Monocytes Absolute: 1.3 10*3/uL — ABNORMAL HIGH (ref 0.1–1.0)
Monocytes Relative: 10.9 % (ref 3.0–12.0)
Neutro Abs: 6.7 10*3/uL (ref 1.4–7.7)
Neutrophils Relative %: 58.4 % (ref 43.0–77.0)
Platelets: 455 10*3/uL — ABNORMAL HIGH (ref 150.0–400.0)
RBC: 3.96 M/uL (ref 3.87–5.11)
RDW: 17.7 % — ABNORMAL HIGH (ref 11.5–14.6)
WBC: 11.5 10*3/uL — ABNORMAL HIGH (ref 4.5–10.5)

## 2010-01-11 ENCOUNTER — Telehealth (INDEPENDENT_AMBULATORY_CARE_PROVIDER_SITE_OTHER): Payer: Self-pay | Admitting: *Deleted

## 2010-02-08 ENCOUNTER — Ambulatory Visit: Payer: Self-pay | Admitting: Internal Medicine

## 2010-02-15 ENCOUNTER — Ambulatory Visit: Payer: Self-pay | Admitting: Internal Medicine

## 2010-02-18 ENCOUNTER — Telehealth: Payer: Self-pay | Admitting: Internal Medicine

## 2010-02-21 ENCOUNTER — Telehealth: Payer: Self-pay | Admitting: Internal Medicine

## 2010-02-23 ENCOUNTER — Ambulatory Visit: Payer: Self-pay | Admitting: Internal Medicine

## 2010-02-23 DIAGNOSIS — J029 Acute pharyngitis, unspecified: Secondary | ICD-10-CM | POA: Insufficient documentation

## 2010-02-23 DIAGNOSIS — H6120 Impacted cerumen, unspecified ear: Secondary | ICD-10-CM | POA: Insufficient documentation

## 2010-03-22 ENCOUNTER — Telehealth: Payer: Self-pay | Admitting: Cardiology
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
## 2010-04-05 NOTE — Progress Notes (Signed)
Summary: WORK NOTE NEEDED TODAY  Phone Note Call from Patient   Summary of Call: Patient is requesting work note. Dates out 5/28 to 6/5, may return 08/09/09. Ok to prepare note?  Initial call taken by: Charlsie Quest, [place],  August 06, 2009 11:21 AM  Follow-up for Phone Call        ok Thx! Follow-up by: Cassandria Anger MD,  August 06, 2009 12:59 PM
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
## 2010-04-05 NOTE — Progress Notes (Signed)
Summary: Refill--Hydrocodone  Phone Note Refill Request Message from:  Fax from Pharmacy on August 18, 2009 11:50 AM  Refills Requested: Medication #1:  HYDROCODONE-HOMATROPINE 5-1.5 MG/5ML SYRP 5 ml by mouth qid as needed cough Next Appointment Scheduled: 10-06-09 Initial call taken by: Ernestene Mention,  August 18, 2009 11:50 AM  Follow-up for Phone Call        ok to ref Follow-up by: Cassandria Anger MD,  August 18, 2009 1:29 PM    Prescriptions: HYDROCODONE-HOMATROPINE 5-1.5 MG/5ML SYRP (HYDROCODONE-HOMATROPINE) 5 ml by mouth qid as needed cough  #300 ml x 0   Entered by:   Charlsie Quest, CMA   Authorized by:   Cassandria Anger MD   Signed by:   Charlsie Quest, CMA on 08/18/2009   Method used:   Telephoned to ...       [facility] [address] [phone]* (retail)       [address]       [place],   [address]       Ph: [phone] or [phone]       Fax: [phone]   RxID:   [id number]

## 2010-04-05 NOTE — Progress Notes (Signed)
Summary: Still sick   Phone Note Call from Patient Call back at [facility] Phone [phone]   Summary of Call: Pt continues to c/o chest congestion and does not "feel any better" than when seen in the office. Looks like on call MD called in an inhaler. Please advise.  Initial call taken by: Charlsie Quest, [place],  August 09, 2009 10:34 AM  Follow-up for Phone Call        Try Take 40mg  qd for 3 days, then 20 mg qd for 3 days, then 10mg  qd for 6 days, then stop. Take pc.  ROV if not better Follow-up by: Cassandria Anger MD,  August 09, 2009 1:00 PM  Additional Follow-up for Phone Call Additional follow up Details #1::        Pt informed  Additional Follow-up by: Charlsie Quest, CMA,  August 09, 2009 2:57 PM    New/Updated Medications: PREDNISONE 10 MG TABS (PREDNISONE) Take 40mg  qd for 3 days, then 20 mg qd for 3 days, then 10mg  qd for 6 days, then stop. Take pc. PREDNISONE 10 MG TABS (PREDNISONE) 4 tabs once daily x 3 days then 2 tabs daily x 3 days then 1 tab dailly x 6 days then stop Prescriptions: PREDNISONE 10 MG TABS (PREDNISONE) 4 tabs once daily x 3 days then 2 tabs daily x 3 days then 1 tab dailly x 6 days then stop  #24 x 0   Entered by:   Charlsie Quest, CMA   Authorized by:   Cassandria Anger MD   Signed by:   Charlsie Quest, CMA on 08/09/2009   Method used:   Electronically to        [facility] [address] [phone]* (retail)       [address]       [place], [place]  [address]       Ph: [phone] or [phone]       Fax: [phone]   RxID:   [id number] PREDNISONE 10 MG TABS (PREDNISONE) Take 40mg  qd for 3 days, then 20 mg qd for 3 days, then 10mg  qd for 6 days, then stop. Take pc.  #24 x 1   Entered and Authorized by:   Cassandria Anger MD   Signed by:   Charlsie Quest, CMA on 08/09/2009   Method used:   Electronically to        [facility] [address] [phone]* (retail)       [address]       [place], [place]   [address]       Ph: [phone] or [phone]       Fax: [phone]   RxID:   [id number]

## 2010-04-05 NOTE — Progress Notes (Signed)
Summary: Call Report  Phone Note Other Incoming   Caller: [facility] Call Report Summary of Call: [facility] Triage Call Report Triage Record Num: [id number] Operator: Threasa Beards [place] Patient Name: [name] Call Date & Time: 08/07/2009 2:33:15PM Patient Phone: [phone] PCP: Patient Gender: Female PCP Fax : Patient DOB: 19-Aug-1935 Practice Name: [facility] Reason for Call: Son is calling re mother having rattling in chest, cough and sob during inspiration. Can take a deep breath, but it "rattles" Seen in Dr. office 08-04-2009 and dx with bronchitis. Taking antibiotic x 4 days. Caller states mother was offered an inhaler ( does not know name) at the time of visit and declined. Would like for inhaler to be called into pharmacy at this time. Dr. Birdie Riddle paged and stated will call in inhaler per pt's request. Caller notified of availibility of inhaler at pharmacy. Homecare advice and all emergent symptoms r/o per Breathing Problems protocol. Protocol(s) Used: Breathing Problems Recommended Outcome per Protocol: See Provider within 24 hours Override Outcome if Used in Protocol: Call Provider Immediately RN Reason for Override Outcome: Stress Level Merits Reason for Outcome: New or increasing production of yellow, green or brown sputum Care Advice:  ~ Use a cool mist humidifier to moisten air. Be sure to clean according to manufacturer's instructions. Limit or avoid exposure to irritants and allergens (e.g. air pollution, smoke/smoking, chemicals, dust, pollen, pet dander, etc.)  ~  ~ Call provider if symptoms worsen or new symptoms develop. Call provider if fever greater than 101.5 F (38.6 C) or 100.5 F (38.1C) in an immunocompromised patient (such as diabetes, HIV/AIDS, renal disease, chemotherapy, organ transplant, or chronic steroid use) has not improved in 24 hours.  ~  ~ If you can, stop smoking now and avoid all secondhand smoke.  ~ SYMPTOM / CONDITION MANAGEMENT  ~  CAUTIONS 06/ Initial call taken by: Crissie Sickles, [place],  August 09, 2009 8:23 AM

## 2010-04-05 NOTE — Assessment & Plan Note (Signed)
Summary: pt states she is anemic adn blood is low/$50/cd   Vital Signs:  Patient Profile:   75 Years Old Female Weight:      136 pounds Temp:     97.1 degrees F oral Pulse rate:   70 / minute BP sitting:   126 / 72  (left arm)  Vitals Entered By: Doralee Albino (October 14, 2007 11:04 AM)                 Chief Complaint:  Multiple medical problems or concerns.  History of Present Illness: The patient presents for a follow up of fatigue, L foot pain, COPD, depression.     Current Allergies (reviewed today): CODEINE  Past Medical History:    Reviewed history from 07/31/2007 and no changes required:       Pneumonia, hx of       GERD       Atrial fibrillation       COPD, Resp failure 2007       Anxiety       Depression       Vit B12 def 2009       Vit D def   Family History:    Reviewed history from 03/01/2007 and no changes required:       Family History of Arthritis       Family History of CAD Female 1st degree relative <50       Family History Hypertension  Social History:    Reviewed history from 03/01/2007 and no changes required:       Retired  works part time       Divorced       Former Smoker    Review of Systems       The patient complains of dyspnea on exertion.  The patient denies fever, abdominal pain, suspicious skin lesions, and peripheral edema.     Physical Exam  General:     Well-developed,well-nourished,in no acute distress; alert,appropriate and cooperative throughout examination Eyes:     No corneal or conjunctival inflammation noted. EOMI. Perrla. Funduscopic exam benign, without hemorrhages, exudates or papilledema. Vision grossly normal. Ears:     External ear exam shows no significant lesions or deformities.  Otoscopic examination reveals clear canals, tympanic membranes are intact bilaterally without bulging, retraction, inflammation or discharge. Hearing is grossly normal bilaterally. Nose:     External nasal examination shows no  deformity or inflammation. Nasal mucosa are pink and moist without lesions or exudates. Mouth:     Oral mucosa and oropharynx without lesions or exudates.  Teeth in good repair. Neck:     No deformities, masses, or tenderness noted. Lungs:     Normal respiratory effort, chest expands symmetrically. Lungs are clear to auscultation, no crackles or wheezes. Heart:     Normal rate and regular rhythm. S1 and S2 normal without gallop, murmur, click, rub or other extra sounds. Abdomen:     Bowel sounds positive,abdomen soft and non-tender without masses, organomegaly or hernias noted. Genitalia:     Normal introitus for age, no external lesions, no vaginal discharge, mucosa pink and moist, no vaginal or cervical lesions, no vaginal atrophy, no friaility or hemorrhage, normal uterus size and position, no adnexal masses or tenderness Msk:     No deformity or scoliosis noted of thoracic or lumbar spine.   Extremities:     No clubbing, cyanosis, edema, or deformity noted with normal full range of motion of all joints.   Neurologic:  No cranial nerve deficits noted. Station and gait are normal. Plantar reflexes are down-going bilaterally. DTRs are symmetrical throughout. Sensory, motor and coordinative functions appear intact. Skin:     Intact without suspicious lesions or rashes Psych:     Cognition and judgment appear intact. Alert and cooperative with normal attention span and concentration. No apparent delusions, illusions, hallucinations    Impression & Recommendations:  Problem # 1:  B12 DEFICIENCY (ICD-266.2) Assessment: New Will give an inj and start PO Orders: Vit B12 1000 mcg (J3420) Admin of Therapeutic Inj  intramuscular or subcutaneous [id number])   Problem # 2:  FATIGUE (ICD-780.79) Assessment: Deteriorated #1 is a big contributor  Problem # 3:  INSOMNIA, PERSISTENT (ICD-307.42) Assessment: Unchanged On Rx  Problem # 4:  DEPRESSION (ICD-311) Assessment: Unchanged  Her  updated medication list for this problem includes:    Ativan 1 Mg Tabs (Lorazepam) .Marland Kitchen..Marland Kitchen Two times a day as needed    Trazodone Hcl 50 Mg Tabs (Trazodone hcl) .Marland Kitchen... 1 by mouth at bedtime as needed insomnia   Problem # 5:  VITAMIN D DEFICIENCY (ICD-268.9) Assessment: Comment Only Risks of noncompliance with treatment discussed. Compliance encouraged. Was not taking  Complete Medication List: 1)  Metoprolol Succinate 25 Mg Tb24 (Metoprolol succinate) .... Take 1 tablet by mouth once a day 2)  Aspirin 81 Mg Tabs (Aspirin) .... Once daily 3)  Ambien 10 Mg Tabs (Zolpidem tartrate) .... At bedtime as needed 4)  Ativan 1 Mg Tabs (Lorazepam) .... Two times a day as needed 5)  Tramadol Hcl 50 Mg Tabs (Tramadol hcl) .Marland Kitchen.. 1-2 by mouth two times a day as needed pain 6)  Vitamin D3 1000 Unit Tabs (Cholecalciferol) .Marland Kitchen.. 1 by mouth daily 7)  Vitamin B-12 Cr 1000 Mcg Tbcr (Cyanocobalamin) .... Take one tablet by mouth daily 8)  Trazodone Hcl 50 Mg Tabs (Trazodone hcl) .Marland Kitchen.. 1 by mouth at bedtime as needed insomnia   Patient Instructions: 1)  Please schedule a follow-up appointment in 2 months. 2)  BMP prior to visit, ICD-9: 3)  Vit B12 266.2 4)  Vit D 268.9   Prescriptions: TRAZODONE HCL 50 MG TABS (TRAZODONE HCL) 1 by mouth at bedtime as needed insomnia  #30 x 6   Entered and Authorized by:   Cassandria Anger MD   Signed by:   Cassandria Anger MD on 10/14/2007   Method used:   Print then Give to Patient   [id number] VITAMIN B-12 CR 1000 MCG  TBCR (CYANOCOBALAMIN) Take one tablet by mouth daily  #30 x 12   Entered and Authorized by:   Cassandria Anger MD   Signed by:   Cassandria Anger MD on 10/14/2007   Method used:   Electronically sent to ...       [facility] [address] [phone]*       [place]       [place],   [address]       Ph: [phone] or [phone]       Fax: [phone]   RxID:   [id number]  ]   Medication Administration  Injection # 1:    Medication: Vit B12 1000 mcg    Diagnosis: B12 DEFICIENCY (ICD-266.2)    Route: IM    Site: L deltoid    Exp Date: 06/2009    Lot #: [id number]    Mfr: [employer]    Comments: 1089mcg  Patient tolerated injection without complications    Given by: Doralee Albino (October 14, 2007 11:50 AM)  Orders Added: 1)  Est. Patient Level IV [id number] 2)  Vit B12 1000 mcg [J3420] 3)  Admin of Therapeutic Inj  intramuscular or subcutaneous [id number]

## 2010-04-05 NOTE — Progress Notes (Signed)
Summary: earlier appt   Phone Note Call from Patient   Summary of Call: PT called and left vm .Marland Kitchen...says have an appt with Dr. Camila Li on Jan 18th and wants to know can she come in [place] than the 18th because she passed out and does not know why  Initial call taken by: Hector [place],  March 05, 2008 9:58 AM  Follow-up for Phone Call        called pt back and asked her about making a closer appt pt stated there is no use in making another appt bc she does have one the 18th.....asked pt is she sure she does not want earlier appt  she stated no ....told pt any other problems call us back  Follow-up by: Hector Brunswick,  March 05, 2008 10:04 AM

## 2010-04-05 NOTE — Assessment & Plan Note (Signed)
Summary: sinus congestion/ SD   Vital Signs:  Patient profile:   75 year old female Height:      65 inches (165.10 cm) Weight:      118.12 pounds (53.69 kg) O2 Sat:      97 % on Room air Temp:     97.0 degrees F (36.11 degrees C) oral Pulse rate:   71 / minute BP sitting:   152 / 80  (left arm) Cuff size:   regular  Vitals Entered By: Tomma Lightning (March 23, 2009 4:12 PM)  O2 Flow:  Room air CC: ? sinus infection Is Patient Diabetic? No Pain Assessment Patient in pain? yes     Location: sinus pressure   Primary Care Provider:  Cassandria Anger MD  CC:  ? sinus infection.  History of Present Illness: here today with complaint of sinus infection . onset of symptoms was >14 days ago. course has been sudden onset and now occurs in constant pattern. problem precipitated by "flu bug" illness symptom characterized as sore throat, nasal congestion and runny nose symptom radiates to bottom of both eyes and behind nose area problem associated with headache and cough but not associated with fever or vision changes, no drainage from eyes or ears, no rash  . symptoms improved by nothing - took Zpack w/o improvment, also claritin ineffective. symptoms worsened with lying down flat at night "i can't breathe if i do that".  Current Medications (verified): 1)  Metoprolol Tartrate 25 Mg Tabs (Metoprolol Tartrate) .Marland Kitchen.. 1 Tab Two Times A Day 2)  Vitamin D3 1000 Unit  Tabs (Cholecalciferol) .Marland Kitchen.. 1 By Mouth Daily 3)  Vitamin B-12 Cr 1000 Mcg  Tbcr (Cyanocobalamin) .... Take One Half  Tablet By Mouth Daily 4)  Prilosec Otc 20 Mg Tbec (Omeprazole Magnesium) .... Once Daily 5)  Lorazepam 1 Mg Tabs (Lorazepam) .... As Needed 1 By Mouth Bid 6)  Baby Aspirin 81 Mg Chew (Aspirin) .... Two Times A Day 7)  Flecainide Acetate 50 Mg Tabs (Flecainide Acetate) .Marland Kitchen.. 1 Tab By Mouth Two Times A Day 8)  Claritin 10 Mg Tabs (Loratadine) .Marland Kitchen.. 1 Po Qd For Allergies As Needed  Allergies (verified): 1)   Codeine  Past History:  Past Medical History: Last updated: 11/19/2008 Current Problems:  PALPITATIONS, HX OF (ICD-V12.50) BRADYCARDIA (ICD-427.89) ATRIAL FIBRILLATION (ICD-427.31) Dr Stanford Breed EMPHYSEMA (ICD-492.8) ARTHRITIS (ICD-716.90) HYPERTENSION (ICD-401.9) SINUSITIS (ICD-473.9) SYNCOPE (ICD-780.2) HEARING IMPAIRMENT (ICD-389.9) B12 DEFICIENCY (ICD-266.2) INSOMNIA, PERSISTENT (ICD-307.42) DEPRESSION (ICD-311) ANXIETY (ICD-300.00) GERD (ICD-530.81) VITAMIN D DEFICIENCY (ICD-268.9) COPD (ICD-496) PNEUMONIA (ICD-486) PEPTIC ULCER DISEASE (ICD-533.90) WEIGHT LOSS, ABNORMAL (ICD-783.21) Allergic rhinitis  Review of Systems       The patient complains of anorexia and weight loss.  The patient denies hoarseness, chest pain, and hemoptysis.    Physical Exam  General:  well-developed well-nourished in no acute distress. Skin is warm and dry. Eyes:  vision grossly intact; pupils equal, round and reactive to light.  conjunctiva and lids normal.   wears glasses Ears:  External ear exam shows no significant lesions or deformities.  Otoscopic examination reveals clear canals, tympanic membranes are intact bilaterally without bulging, retraction, inflammation or discharge. Hearing is grossly normal bilaterally w/a hearing aid. Nose:  L maxillary sinus tenderness and R maxillary sinus tenderness.   Mouth:  teeth and gums in good repair; mucous membranes moist, without lesions or ulcers. oropharynx clear without exudate, mild-mod erythema. +PND Lungs:  normal respiratory effort, no intercostal retractions or use of accessory muscles; normal breath sounds bilaterally -  no crackles and no wheezes.    Heart:  regular rate and rhythm   Impression & Recommendations:  Problem # 1:  SINUSITIS (ICD-473.9)  Her updated medication list for this problem includes:    Augmentin 500-125 Mg Tabs (Amoxicillin-pot clavulanate) .Marland Kitchen... 1 by mouth two times a day x 7days    Flonase 50 Mcg/act Susp  (Fluticasone propionate) .Marland Kitchen... 1 spray each nostril  every morning  Take antibiotics for full duration. Discussed treatment options   Orders: Prescription Created Electronically [phone])  Complete Medication List: 1)  Metoprolol Tartrate 25 Mg Tabs (Metoprolol tartrate) .Marland Kitchen.. 1 tab two times a day 2)  Vitamin D3 1000 Unit Tabs (Cholecalciferol) .Marland Kitchen.. 1 by mouth daily 3)  Vitamin B-12 Cr 1000 Mcg Tbcr (Cyanocobalamin) .... Take one half  tablet by mouth daily 4)  Prilosec Otc 20 Mg Tbec (Omeprazole magnesium) .... Once daily 5)  Lorazepam 1 Mg Tabs (Lorazepam) .... As needed 1 by mouth bid 6)  Baby Aspirin 81 Mg Chew (Aspirin) .... Two times a day 7)  Flecainide Acetate 50 Mg Tabs (Flecainide acetate) .Marland Kitchen.. 1 tab by mouth two times a day 8)  Claritin 10 Mg Tabs (Loratadine) .Marland Kitchen.. 1 po qd for allergies as needed 9)  Augmentin 500-125 Mg Tabs (Amoxicillin-pot clavulanate) .Marland Kitchen.. 1 by mouth two times a day x 7days 10)  Flonase 50 Mcg/act Susp (Fluticasone propionate) .Marland Kitchen.. 1 spray each nostril  every morning  Patient Instructions: 1)  it was good to see you today.  2)  new antibiotics - Augmentin - and nasal spray to help woith your sinus symptoms - Flonase - your prescriptions have been electronically submitted to your pharmacy. Please take as directed. Contact our office if you believe you're having problems with the medication(s).  3)  ok to continue using nasal saline and claritin as you are doing 4)  if worsening symptoms like fever or pain, or if persisting symptoms after treatment, call for re-evaluation as needed  Prescriptions: FLONASE 50 MCG/ACT SUSP (FLUTICASONE PROPIONATE) 1 spray each nostril  every morning  #1 x 0   Entered and Authorized by:   Rowe Clack MD   Signed by:   Rowe Clack MD on 03/23/2009   Method used:   [facility]        [facility] [address] [phone]* (retail)       [address]       [place], [place]  [address]        Ph: [phone] or [phone]       Fax: [phone]   RxID:   [id number] AUGMENTIN 500-125 MG TABS (AMOXICILLIN-POT CLAVULANATE) 1 by mouth two times a day x 7days  #14 x 0   Entered and Authorized by:   Rowe Clack MD   Signed by:   Rowe Clack MD on 03/23/2009   Method used:   Electronically to        [facility]  [facility] [address] [phone]* (retail)       [address]       [place], [place]  [address]       Ph: [phone] or [phone]       Fax: [phone]   RxID:   [id number]

## 2010-04-05 NOTE — Progress Notes (Signed)
Summary: refill--Hydrocodone-Homatropine Syrup  Phone Note Refill Request Message from:  Fax from [place] on November 11, 2009 10:20 AM  Refills Requested: Medication #1:  Hydrocodone-Homatropine Syrup   Dosage confirmed as above?Dosage Confirmed   Supply Requested: 327mL   Last Refilled: 08/18/2009 Next Appointment Scheduled: 02-15-10 Dr Alain Marion Initial call taken by: Kelle Darting CMA Deborra Medina),  November 11, 2009 10:21 AM  Follow-up for Phone Call        ok to ref Follow-up by: Cassandria Anger MD,  November 11, 2009 12:42 PM  Additional Follow-up for Phone Call Additional follow up Details #1::        Rx called to pharmacy Additional Follow-up by: Jonathon Resides, [facility]),  November 12, 2009 9:23 AM    New/Updated Medications: HYDROCODONE-HOMATROPINE 5-1.5 MG TABS (HYDROCODONE-HOMATROPINE) as directed Prescriptions: HYDROCODONE-HOMATROPINE 5-1.5 MG TABS (HYDROCODONE-HOMATROPINE) as directed  #325ml x 0   Entered by:   Jonathon Resides, CMA(AAMA)   Authorized by:   Cassandria Anger MD   Signed by:   Jonathon Resides, CMA(AAMA) on 11/12/2009   Method used:   Telephoned to ...       [facility] [address] [phone]* (retail)       [address]       [place], [place]  [address]       Ph: [phone] or [phone]       Fax: [phone]   RxID:   [id number]

## 2010-04-05 NOTE — Progress Notes (Signed)
Summary: RF  Phone Note Refill Request   Refills Requested: Medication #1:  AMBIEN 10 MG  TABS at bedtime as needed Initial call taken by: Charlsie Quest,  February 20, 2008 4:36 PM  Follow-up for Phone Call        OK for refill x 3 Follow-up by: Neena Rhymes MD,  February 21, 2008 7:59 AM      Prescriptions: AMBIEN 10 MG  TABS (ZOLPIDEM TARTRATE) at bedtime as needed  #30 x 3   Entered by:   Charlsie Quest   Authorized by:   Cassandria Anger MD   Signed by:   Charlsie Quest on 02/21/2008   Method used:   Telephoned to ...       [facility]  [facility] [address] [phone]* (retail)       [address]       [place], [place]  [address]       Ph: [phone] or [phone]       Fax: [phone]   RxID:   [id number]

## 2010-04-05 NOTE — Progress Notes (Signed)
Summary: REQ FOR RX  Phone Note Call from Patient Call back at [facility] Phone [phone]   Summary of Call: Pt c/o sores in her nose, difficult to breath thru nose, also c/o sinus congestion and feels she has sinus infection. Patient is requesting rx from MD.  Initial call taken by: Charlsie Quest, [place],  December 29, 2009 9:23 AM  Follow-up for Phone Call        ok Zpac OV if sick Follow-up by: Cassandria Anger MD,  December 29, 2009 1:21 PM  Additional Follow-up for Phone Call Additional follow up Details #1::        Pt informed  Additional Follow-up by: Charlsie Quest, CMA,  January 01, 2010 12:16 PM    New/Updated Medications: ZITHROMAX Z-PAK 250 MG TABS (AZITHROMYCIN) as dirrected Prescriptions: ZITHROMAX Z-PAK 250 MG TABS (AZITHROMYCIN) as dirrected  #1 x 0   Entered and Authorized by:   Cassandria Anger MD   Signed by:   Charlsie Quest, CMA on 01/01/2010   Method used:   Electronically to        [facility]  [facility] [address] [phone]* (retail)       [address]       [place], [place]  [address]       Ph: [phone] or [phone]       Fax: [phone]   RxID:   [id number]

## 2010-04-05 NOTE — Letter (Signed)
Summary: Out of Work  [employer] [address] [place], [place] [address]   Phone: [phone]  Fax: [phone]    August 06, 2009   Employee:  ADILEE CARDINAL Logie    To Whom It May Concern:   For Medical reasons, please excuse the above named employee from work for the following dates:  Start:   07/31/2009  End:   08/08/2009  If you need additional information, please feel free to contact our office.         Sincerely,    Charlsie Quest, CMA (AAMA) For Dr A. Plotnikov

## 2010-04-05 NOTE — Consult Note (Signed)
Summary: [facility]   Imported By: Jerrye Noble D'jimraou 09/02/2007 15:26:27  _____________________________________________________________________  External Attachment:    Type:   Image     Comment:   External Document
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
## 2010-04-05 NOTE — Progress Notes (Signed)
Summary: ALT RX  Phone Note From Pharmacy   Summary of Call: Per pharm, Tussinex is not covered, please advise.  Initial call taken by: Charlsie Quest, [place],  August 05, 2009 4:34 PM  Follow-up for Phone Call        Hydroc-Homatr Follow-up by: Cassandria Anger MD,  August 05, 2009 10:45 PM    New/Updated Medications: HYDROCODONE-HOMATROPINE 5-1.5 MG/5ML SYRP (HYDROCODONE-HOMATROPINE) 5 ml by mouth qid as needed cough Prescriptions: HYDROCODONE-HOMATROPINE 5-1.5 MG/5ML SYRP (HYDROCODONE-HOMATROPINE) 5 ml by mouth qid as needed cough  #300 ml x 0   Entered by:   Charlsie Quest, CMA   Authorized by:   Cassandria Anger MD   Signed by:   Charlsie Quest, CMA on 08/06/2009   Method used:   Telephoned to ...       [facility] [address] [phone]* (retail)       [address]       [place], [place]  [address]       Ph: [phone] or [phone]       Fax: [phone]   RxID:   [id number]

## 2010-04-05 NOTE — Letter (Signed)
Summary: Appointment - [facility] [place], [place]  [address] [place]   [place], [place] [address]   Phone: [phone]  Fax: [phone]     September 21, 2008 MRN: [id number]   [facility] [address] [place], [place]  [address]   Dear [name],  Our records indicate that it is time to schedule a follow-up appointment.  Dr.Crenshaw recommended that you follow up with [place] in September 2010. It is very important that we reach you to schedule this appointment. We look forward to participating in your health care needs. Please contact us at the number listed above at your earliest convenience to schedule your appointment.  If you are unable to make an appointment at this time, give [place] a call so we can update our records.     Sincerely,  [facility] Scheduling Team

## 2010-04-05 NOTE — Progress Notes (Signed)
Summary: zpak  Phone Note Call from Patient   Caller: Patient Call For: [phone] Summary of Call: Patient called back stating swollen glands have returned. Was last given a zpak that help and would like to know if ok to have a refill? Initial call taken by: Estell Harpin CMA,  February 10, 2009 2:55 PM  Follow-up for Phone Call        OK to ref Follow-up by: Cassandria Anger MD,  February 10, 2009 11:42 PM  Additional Follow-up for Phone Call Additional follow up Details #1::        Pt's son informed Additional Follow-up by: Charlsie Quest, CMA,  February 11, 2009 8:43 AM    New/Updated Medications: ZITHROMAX Z-PAK 250 MG TABS (AZITHROMYCIN) as dirrected Prescriptions: ZITHROMAX Z-PAK 250 MG TABS (AZITHROMYCIN) as dirrected  #1 x 0   Entered and Authorized by:   Cassandria Anger MD   Signed by:   Charlsie Quest, CMA on 02/11/2009   Method used:   Electronically to        [facility] [address] [phone]* (retail)       [address]       [place], [place]  [address]       Ph: [phone] or [phone]       Fax: [phone]   RxID:   [id number]   Appended Document: zpak Pharm did not recieve rx for zpak, called in

## 2010-04-05 NOTE — Procedures (Signed)
Summary: Event Monitor Results  Event Monitor Results   Imported By: Sallee Provencal 09/28/2008 12:03:07  _____________________________________________________________________  External Attachment:    Type:   Image     Comment:   External Document

## 2010-04-05 NOTE — Progress Notes (Signed)
Summary: REQ FOR RX  Phone Note Call from Patient Call back at [phone]   Summary of Call: Patient is requesting rx for "bad" sore throat and dry nonproductive cough from sinus drainage. Has not taken anything OTC, please advise.  Initial call taken by: Charlsie Quest, [place],  Jul 30, 2009 10:15 AM  Follow-up for Phone Call        Use over-the-counter medicines for "cold": Tylenol  650mg  or Advil 400mg  every 6 hours  for fever; Delsym or Robutussin for cough. Mucinex or Mucinex D for congestion. Ricola or Halls for sore throat. Office visit if not better or if worse.  Call in Prom/Cod Follow-up by: Cassandria Anger MD,  Jul 30, 2009 3:30 PM  Additional Follow-up for Phone Call Additional follow up Details #1::        Pt informed, she declined cough med Additional Follow-up by: Charlsie Quest, CMA,  Jul 30, 2009 4:02 PM    New/Updated Medications: PROMETHAZINE-CODEINE 6.25-10 MG/5ML SYRP (PROMETHAZINE-CODEINE) 5-10 ml by mouth q id as needed cough  Appended Document: [facility] sent fax that Medicare will not cover this prescription and would like to know if the patient can have cheaper alternative? Hycodan? Please advise.  Appended Document: REQ FOR RX see phone note

## 2010-04-05 NOTE — Assessment & Plan Note (Signed)
Summary: DISCUSS MEDS/$50/CD    History of Present Illness: Ran out Seroquel 1day ago. Can I stop it?  Refused VS's    Current Allergies: CODEINE  Past Medical History:    Reviewed history from 12/23/2007 and no changes required:       Pneumonia, hx of       GERD       Atrial fibrillation, PAF       COPD, Resp failure 2007       Anxiety       Depression       Vit B12 def 2009       Vit D def      Physical Exam  General:     Well-developed,well-nourished,in no acute distress; alert,appropriate and cooperative throughout examination    Impression & Recommendations:  Problem # 1:  INSOMNIA, PERSISTENT (ICD-307.42) Assessment: Improved Lately she was on Seroquel 25 mg 1/2 by mouth once daily. OK to stop it.  Problem # 2:  ANXIETY (ICD-300.00) Assessment: Unchanged  Her updated medication list for this problem includes:    Lorazepam 0.5 Mg Tabs (Lorazepam) .Marland Kitchen... 1 by mouth two times a day as needed has a Rx at home. Stress with son.  Her updated medication list for this problem includes:    Lorazepam 0.5 Mg Tabs (Lorazepam) .Marland Kitchen... 1 by mouth two times a day prn   Complete Medication List: 1)  Metoprolol Succinate 25 Mg Tb24 (Metoprolol succinate) .... Take 1 tablet by mouth once a day 2)  Ambien 10 Mg Tabs (Zolpidem tartrate) .... At bedtime as needed 3)  Vitamin D3 1000 Unit Tabs (Cholecalciferol) .Marland Kitchen.. 1 by mouth daily 4)  Vitamin B-12 Cr 1000 Mcg Tbcr (Cyanocobalamin) .... Take one half  tablet by mouth daily 5)  Lorazepam 0.5 Mg Tabs (Lorazepam) .Marland Kitchen.. 1 by mouth two times a day prn   Patient Instructions: 1)  Keep ROV
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
## 2010-04-05 NOTE — Progress Notes (Signed)
Summary: refill meds  Phone Note Refill Request Call back at Home Phone [phone] Message from:  Patient on September 15, 2009 3:06 PM  Refills Requested: Medication #1:  FLECAINIDE ACETATE 50 MG TABS 1 tab by mouth two times a day [facility] [address]    Method Requested: Fax to [place] Initial call taken by: Neil Crouch,  September 15, 2009 3:06 PM    Prescriptions: FLECAINIDE ACETATE 50 MG TABS (FLECAINIDE ACETATE) 1 tab by mouth two times a day  #60 x 12   Entered by:   Burnett Kanaris   Authorized by:   Colin Mulders, MD, [facility]   Signed by:   Burnett Kanaris on 09/15/2009   Method used:   Electronically to        [facility] [address] [phone]* (retail)       [address]       [place], [place]  [address]       Ph: [phone] or [phone]       Fax: [phone]   RxID:   [id number]

## 2010-04-05 NOTE — Progress Notes (Signed)
Summary: Hydroco-Homatrophine syrup-DENIED  Phone Note From Pharmacy   Refills Requested: Medication #1:  HYDROCODONE-HOMATROPINE 5-1.5 MG TABS as directed.   Dosage confirmed as above?Dosage Confirmed   Supply Requested: 357ml Summary of Call: [facility] stating this pt's son has called requesting Hydrocodo-Homatrophine syrup 372ml take 34ml po up to qid for her.  *****The fax states that he is a "big drug seeker" and they just wanted you to be aware.*****  Ok to fill this med for pt??? Last filled 11/12/2009 Initial call taken by: Jonathon Resides, [facility]),  December 17, 2009 3:21 PM  Follow-up for Phone Call        No, not OK: denied. Thank you!  Follow-up by: Cassandria Anger MD,  December 17, 2009 4:19 PM  Additional Follow-up for Phone Call Additional follow up Details #1::        faxed denial to pharm Additional Follow-up by: Jonathon Resides, [facility]),  December 20, 2009 8:30 AM

## 2010-04-05 NOTE — Letter (Signed)
Summary: ELAM Consult Scheduled Letter  [facility]   [place], [place] [address]   Phone: [phone]  Fax: [phone]      05/25/2008 MRN: [id number]  [name] [address] [place], [place]  [address]    Dear [name],      We have scheduled an appointment for you.  At the recommendation of Dr. Alain Marion, we have scheduled you a consult with Dr. Stanford Breed on July 01, 2008 at 10:00am.  Their phone number is [phone].  If this appointment day and time is not convenient for you, please feel free to call the office of the doctor you are being referred to at the number listed above and reschedule the appointment.  Dr. [facility] [address] [place], [place] [address]  *Please give 24hr notice to cancel/reschedule your appointment to avoid a $50.00 fee*  Thank you,  Patient Care Coordinator [facility]

## 2010-04-05 NOTE — Progress Notes (Signed)
Summary: Req for RX  Phone Note Call from Patient Call back at [facility] Phone [phone]   Summary of Call: Patient is requesting rx for antibiotic. C/o sore throat, non productive cough, & sinus congestion x 4 days. No fever. 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 She has not taken any otc meds.  Initial call taken by: Charlsie Quest,  January 13, 2008 9:02 AM  Follow-up for Phone Call        [facility] Follow-up by: Cassandria Anger MD,  January 13, 2008 9:21 AM  Additional Follow-up for Phone Call Additional follow up Details #1::        Pt informed  Additional Follow-up by: Charlsie Quest,  January 13, 2008 1:39 PM    New/Updated Medications: ZITHROMAX Z-PAK 250 MG  TABS (AZITHROMYCIN) Use as directed   Prescriptions: ZITHROMAX Z-PAK 250 MG  TABS (AZITHROMYCIN) Use as directed  #1 x 0   Entered and Authorized by:   Cassandria Anger MD   Signed by:   Charlsie Quest on 01/13/2008   Method used:   Electronically to        [facility] [address] [phone]* (retail)       [address]       [place], [place]  [address]       Ph: [phone] or [phone]       Fax: [phone]   RxID:   [id number]

## 2010-04-05 NOTE — Progress Notes (Signed)
Summary: after hours- inhaler  Phone Note Call from Patient   Caller: Son Summary of Call: son called Call a Nurse asking for the inhaler that was offered to his mother at her recent appt.  reports she is not short of breath and cough seems to be breaking up.  see that MDI was offered at her office visit but no name- ? albuterol.  given that i don't have any info to go on or know what the intended medication was I will send script for albuterol to pt's pharmacy.  if this is not correct, it can be changed by PCP on Monday.    New/Updated Medications: VENTOLIN HFA 108 (90 BASE) MCG/ACT AERS (ALBUTEROL SULFATE) 2 puffs Q4 as needed for cough/wheeze Prescriptions: VENTOLIN HFA 108 (90 BASE) MCG/ACT AERS (ALBUTEROL SULFATE) 2 puffs Q4 as needed for cough/wheeze  #1 x 0   Entered and Authorized by:   Annye Asa MD   Signed by:   Annye Asa MD on 08/07/2009   Method used:   Electronically to        [facility] [address] [phone]* (retail)       [address]       [place], [place]  [address]       Ph: [phone] or [phone]       Fax: [phone]   RxID:   [id number]
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
## 2010-04-05 NOTE — Miscellaneous (Signed)
Summary: Flu 2011  Clinical Lists Changes  Observations: Added new observation of FLU VAX: Historical (12/21/2009 16:14)      Immunization History:  Influenza Immunization History:    Influenza:  historical (12/21/2009)

## 2010-04-05 NOTE — Progress Notes (Signed)
Summary: Call Report  Phone Note Other Incoming   Caller: [facility] Call Report Summary of Call: [facility] Triage Call Report Triage Record Num: [id number] Operator: Flonnie Hailstone Patient Name: [name] Call Date & Time: 04/24/2009 7:11:10PM Patient Phone: [phone] PCP: Patient Gender: Female PCP Fax : Patient DOB: December 15, 1935 Practice Name: [facility] Reason for Call: Has swollen glands in neck since 2-19. Hurts when swallows. States is fourth time has happened in 1 month. Afebrile. Advised to go to UC 2-20. Protocol(s) Used: Sore Throat / Hoarseness Recommended Outcome per Protocol: See Provider within 24 hours Reason for Outcome: New onset of painful, swollen glands on sides of neck or under jaw Care Advice: Apply warm, moist soaks or compresses to the affected area for 20-30 minutes 3 to 4 times per day. Avoid burning skin by using water no hotter than bath water and by not lying on the compresses.  ~  Initial call taken by: Crissie Sickles, [place],  April 26, 2009 3:00 PM  Follow-up for Phone Call        Noted. OV if ill Follow-up by: Cassandria Anger MD,  April 27, 2009 7:49 AM  Additional Follow-up for Phone Call Additional follow up Details #1::        Patient declined office visit.  Additional Follow-up by: Ernestene Mention,  April 27, 2009 8:18 AM

## 2010-04-05 NOTE — Consult Note (Signed)
Summary: [place]   Imported By: Laural Benes 07/03/2007 13:11:53  _____________________________________________________________________  External Attachment:    Type:   Image     Comment:   External Document

## 2010-04-05 NOTE — Miscellaneous (Signed)
Summary: lorazepam   Clinical Lists Changes  Medications: Rx of LORAZEPAM 0.5 MG TABS (LORAZEPAM) 1 by mouth two times a day prn;  #60 x 3;  Signed;  Entered by: Doralee Albino;  Authorized by: Cassandria Anger MD;  Method used: [facility] [address] [phone]*, [address] [place], [place], [place]  [address], Ph: [phone] or [phone], Fax: [phone]    Prescriptions: LORAZEPAM 0.5 MG TABS (LORAZEPAM) 1 by mouth two times a day prn  #60 x 3   Entered by:   Doralee Albino   Authorized by:   Cassandria Anger MD   Signed by:   Doralee Albino on 06/22/2008   Method used:   Telephoned to ...       [facility] [address] [phone]* (retail)       [address]       [place], [place]  [address]       Ph: [phone] or [phone]       Fax: [phone]   RxID:   [id number]
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
## 2010-04-05 NOTE — Progress Notes (Signed)
Summary: QUESTION ABOUT MONITOR RETURN   Phone Note Call from Patient Call back at Home Phone [phone]   Caller: Patient Summary of Call: PATIENT WOULD [name] IS SUPPOSED TO MAIL HER MONITOR BACK.  Initial call taken by: Alexander Bergeron,  August 12, 2008 9:55 AM    Already talk to patient about returning the monitor

## 2010-04-05 NOTE — Progress Notes (Signed)
  Phone Note Call from Patient   Caller: Patient Summary of Call: Pt called to state she is not feeling any better. has sores in nose, difficulty breathing, throat is closing.   called back saw on note where Dr mentioned that if she is sick, she needs an office visit. connected her to scheduler.  Initial call taken by: Glenda Chroman,  January 06, 2010 1:27 PM

## 2010-04-05 NOTE — Progress Notes (Signed)
Summary: REQ FOR RX  Phone Note Call from Patient Call back at [facility] Phone [phone]   Summary of Call: Pt c/o sinus congestion/drainage and sore throat. Patient is requesting rx for antibiotic.  Initial call taken by: Charlsie Quest, [place],  March 22, 2009 3:50 PM  Follow-up for Phone Call        OV w/any MD pls Follow-up by: Cassandria Anger MD,  March 22, 2009 5:41 PM  Additional Follow-up for Phone Call Additional follow up Details #1::        Scheduled for tomorrow Additional Follow-up by: Charlsie Quest, CMA,  March 22, 2009 5:52 PM

## 2010-04-05 NOTE — Progress Notes (Signed)
Summary: ADVERSE REACTION TO FLECANIDE  Phone Note Call from Patient Call back at Home Phone [phone]   Caller: Patient Reason for Call: Talk to Nurse Summary of Call: PATIENT CANNOT TOLERATE NEW MEDICATION...FLECANIDE   CANNOT FOCUS, STAGGERS WHEN SHE WALKS, NAUSEA, VISION BLURRED Initial call taken by: Alexander Bergeron,  August 21, 2008 12:51 PM  Follow-up for Phone Call        spoke with pt, she is unable to tolerate flecainide, pt instructed to cut dosage to one tablet daily.will discuss further medication changes with dr Stanford Breed Fredia Beets, RN  August 21, 2008 1:59 PM  Additional Follow-up for Phone Call Additional follow up Details #1::        D/C flecanide; tell patient afib more likely to recur off med; schedule f/u ov to discuss other therapies Colin Mulders, MD, [facility]  August 21, 2008 3:47 PM     Additional Follow-up for Phone Call Additional follow up Details #2::    pt aware of dr Stanford Breed reccommendations, she would prefer to stay on the flecainide one tablet daily until she sees dr Stanford Breed back. appt made for 08/25/08 at [place], RN  August 21, 2008 4:59 PM

## 2010-04-05 NOTE — Progress Notes (Signed)
  Phone Note Call from Patient   Caller: Patient Summary of Call: Pt  left message requesting callback. Called pt back and left message on vmail. Initial call taken by: Iran Planas CMA,  April 17, 2008 5:57 PM

## 2010-04-05 NOTE — Progress Notes (Signed)
Summary: Rx request  Phone Note Call from Patient   Caller: Patient Summary of Call: Pt left message stating called twice last week in reference to losing Ambien Rx. Called pt and left message to call office back. On 08/21/2007 Ambien was faxed by Charlsie Quest. Initial call taken by: Iran Planas CMA,  August 26, 2007 3:02 PM  Follow-up for Phone Call        PATIENT INFORMED. Follow-up by: Linden Dolin,  August 27, 2007 3:38 PM

## 2010-04-05 NOTE — Progress Notes (Signed)
Summary: Vicodin Rf/Plot pt  Phone Note Refill Request Message from:  Fax from Pharmacy  Refills Requested: Medication #1:  HYDROCODONE-ACETAMINOPHEN 5-325 MG TABS 1-2 by mouth two times a day as needed pain   Dosage confirmed as above?Dosage Confirmed   Supply Requested: 20   Last Refilled: 01/08/2010  Method Requested: Telephone to Pharmacy Next Appointment Scheduled: 02-15-10 Initial call taken by: Jonathon Resides, [facility]),  January 11, 2010 10:16 AM  Follow-up for Phone Call        filled on Nov 5th. Sig was one qid as needed - she has used 20 in 4 days - that is 5 per day!. Can refill #20 and appt to see Dr. Cira Rue Friday or Monday Follow-up by: Neena Rhymes MD,  January 11, 2010 1:31 PM  Additional Follow-up for Phone Call Additional follow up Details #1::        Rx called to pharmacy.  Left vm to inform pt she needs an appt for further refills. Additional Follow-up by: Jonathon Resides, [facility]),  January 11, 2010 2:11 PM    Prescriptions: HYDROCODONE-ACETAMINOPHEN 5-325 MG TABS (HYDROCODONE-ACETAMINOPHEN) 1-2 by mouth two times a day as needed pain  #20 x 0   Entered by:   Jonathon Resides, CMA(AAMA)   Authorized by:   Cassandria Anger MD   Signed by:   Jonathon Resides, CMA(AAMA) on 01/11/2010   Method used:   Telephoned to ...       [facility] [address] [phone]* (retail)       [address]       [place],   [address]       Ph: [phone] or [phone]       Fax: [phone]   RxID:   [id number]

## 2010-04-05 NOTE — Consult Note (Signed)
Summary: [place]   Imported By: Jerrye Noble D'jimraou 08/06/2007 13:08:00  _____________________________________________________________________  External Attachment:    Type:   Image     Comment:   External Document
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
## 2010-04-05 NOTE — Progress Notes (Signed)
Summary: HAVING DIARRHEA/ ON NEW MED   Phone Note Call from Patient Call back at Home Phone [phone]   Caller: Patient Reason for Call: Talk to Nurse Complaint: Nausea/Vomiting/Diarrhea Summary of Call: PT ON [place], [place] Initial call taken by: Neil Crouch,  August 18, 2008 3:13 PM  Follow-up for Phone Call        spoke to pt, she is having alot of diarrhea. she has had 8 episodes today. she is unable to even drink water. discussed with mary parler pharm md, flecainide is unlikely to be causing diarrhea. that is the only new med the pt was started on in the hosp. her metoprolol succ was changed to tartrate but that should not make a difference. there is a GI bug going around. encouraged pt to call primary md to be seen. encouraged pt to try to force fluids if poss. Fredia Beets, RN  August 18, 2008 3:39 PM

## 2010-04-07 NOTE — Progress Notes (Signed)
Summary: REQ FOR RX  Phone Note Call from Patient Call back at [facility] Phone [phone]   Summary of Call: Pt c/o sinus pressure, congestion and loss of taste. She is req rx for antibiotic.  Initial call taken by: Charlsie Quest, [place],  February 18, 2010 12:08 PM  Follow-up for Phone Call        ok Zpac Follow-up by: Cassandria Anger MD,  February 18, 2010 1:02 PM  Additional Follow-up for Phone Call Additional follow up Details #1::        Patient notified/lm at home phone number (per release) rx sent to pharmacy.Ellison Hughs Archie CMA  February 18, 2010 1:21 PM     New/Updated Medications: ZITHROMAX Z-PAK 250 MG TABS (AZITHROMYCIN) as dirrected Prescriptions: ZITHROMAX Z-PAK 250 MG TABS (AZITHROMYCIN) as dirrected  #1 x 0   Entered and Authorized by:   Cassandria Anger MD   Signed by:   Estell Harpin CMA on 02/18/2010   Method used:   Electronically to        [facility] [address] [phone]* (retail)       [address]       [place], [place]  [address]       Ph: [phone] or [phone]       Fax: [phone]   RxID:   [id number]

## 2010-04-07 NOTE — Progress Notes (Signed)
Summary: Rf lorazepam  Phone Note Refill Request Message from:  Fax from Pharmacy  Refills Requested: Medication #1:  LORAZEPAM 1 MG TABS as needed 1 by mouth bid   Dosage confirmed as above?Dosage Confirmed   Supply Requested: 60   Last Refilled: 01/27/2010  Method Requested: Telephone to Pharmacy Next Appointment Scheduled: none Initial call taken by: Jonathon Resides, [facility]),  February 21, 2010 12:03 PM  Follow-up for Phone Call        done Follow-up by: Cassandria Anger MD,  February 24, 2010 1:01 PM
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
## 2010-04-07 NOTE — Progress Notes (Signed)
Summary: Calling regarding medication  Phone Note Call from Patient Call back at Home Phone [phone]   Caller: Patient Summary of Call: Pt calling regarding medications Initial call taken by: Delsa Sale,  March 22, 2010 8:48 AM  Follow-up for Phone Call        spoke with pt, she was wanting to change to generic toprol. she is currently taking toprol 12.5mg  once daily. explained to pt she would be unable to get 6.25mg  of lopressor. offered pt to try 12.5mg  of lopressor but she was cautioned regarding hypotension, which she had previously on lopressor 25mg  two times a day. the pt would prefer to stay on toprol at the current dose. Fredia Beets, RN  March 22, 2010 9:33 AM

## 2010-04-14 ENCOUNTER — Ambulatory Visit (INDEPENDENT_AMBULATORY_CARE_PROVIDER_SITE_OTHER): Payer: MEDICARE | Admitting: Cardiology

## 2010-04-14 ENCOUNTER — Encounter: Payer: Self-pay | Admitting: Cardiology

## 2010-04-14 DIAGNOSIS — I4891 Unspecified atrial fibrillation: Secondary | ICD-10-CM

## 2010-04-14 DIAGNOSIS — R0602 Shortness of breath: Secondary | ICD-10-CM

## 2010-04-14 DIAGNOSIS — R002 Palpitations: Secondary | ICD-10-CM

## 2010-04-19 ENCOUNTER — Other Ambulatory Visit (HOSPITAL_COMMUNITY): Payer: MEDICARE

## 2010-04-19 ENCOUNTER — Encounter (INDEPENDENT_AMBULATORY_CARE_PROVIDER_SITE_OTHER): Payer: Medicare Other

## 2010-04-19 ENCOUNTER — Ambulatory Visit (HOSPITAL_COMMUNITY): Payer: Medicare Other | Attending: Cardiology

## 2010-04-19 DIAGNOSIS — I079 Rheumatic tricuspid valve disease, unspecified: Secondary | ICD-10-CM | POA: Insufficient documentation

## 2010-04-19 DIAGNOSIS — R0609 Other forms of dyspnea: Secondary | ICD-10-CM | POA: Insufficient documentation

## 2010-04-19 DIAGNOSIS — J449 Chronic obstructive pulmonary disease, unspecified: Secondary | ICD-10-CM | POA: Insufficient documentation

## 2010-04-19 DIAGNOSIS — R002 Palpitations: Secondary | ICD-10-CM

## 2010-04-19 DIAGNOSIS — R55 Syncope and collapse: Secondary | ICD-10-CM | POA: Insufficient documentation

## 2010-04-19 DIAGNOSIS — I080 Rheumatic disorders of both mitral and aortic valves: Secondary | ICD-10-CM | POA: Insufficient documentation

## 2010-04-19 DIAGNOSIS — I10 Essential (primary) hypertension: Secondary | ICD-10-CM | POA: Insufficient documentation

## 2010-04-19 DIAGNOSIS — I379 Nonrheumatic pulmonary valve disorder, unspecified: Secondary | ICD-10-CM | POA: Insufficient documentation

## 2010-04-19 DIAGNOSIS — R0602 Shortness of breath: Secondary | ICD-10-CM

## 2010-04-19 DIAGNOSIS — R0989 Other specified symptoms and signs involving the circulatory and respiratory systems: Secondary | ICD-10-CM | POA: Insufficient documentation

## 2010-04-19 DIAGNOSIS — J4489 Other specified chronic obstructive pulmonary disease: Secondary | ICD-10-CM | POA: Insufficient documentation
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
## 2010-05-02 ENCOUNTER — Telehealth: Payer: Self-pay | Admitting: Internal Medicine

## 2010-05-11 ENCOUNTER — Encounter: Payer: Self-pay | Admitting: Cardiology

## 2010-05-12 NOTE — Progress Notes (Signed)
Summary: LABS?   Phone Note Call from Patient   Summary of Call: Pt left vm - c/o "dizzy spells" and wants to know if MD wants her to have her "blood checked".  Initial call taken by: Charlsie Quest, [place],  May 02, 2010 4:25 PM  Follow-up for Phone Call        ok CBC, TSH, BMET 780.79 Vit B12 266.20 Follow-up by: Cassandria Anger MD,  May 02, 2010 9:35 PM  Additional Follow-up for Phone Call Additional follow up Details #1::        # disconnected.....................Marland KitchenCharlsie Quest, CMA  May 03, 2010 11:05 AM   Called home number D/C and no personal VM on work phone. Closing phone note until pt calls back.Ellison Hughs Archie CMA  May 03, 2010 2:15 PM

## 2010-05-18 ENCOUNTER — Telehealth: Payer: Self-pay | Admitting: Cardiology

## 2010-05-24 ENCOUNTER — Encounter: Payer: Self-pay | Admitting: Cardiology

## 2010-05-24 ENCOUNTER — Ambulatory Visit (INDEPENDENT_AMBULATORY_CARE_PROVIDER_SITE_OTHER): Payer: Medicare Other | Admitting: Cardiology

## 2010-05-24 DIAGNOSIS — I4891 Unspecified atrial fibrillation: Secondary | ICD-10-CM

## 2010-05-24 DIAGNOSIS — J4489 Other specified chronic obstructive pulmonary disease: Secondary | ICD-10-CM

## 2010-05-24 DIAGNOSIS — J449 Chronic obstructive pulmonary disease, unspecified: Secondary | ICD-10-CM

## 2010-05-24 DIAGNOSIS — R002 Palpitations: Secondary | ICD-10-CM

## 2010-05-24 DIAGNOSIS — I10 Essential (primary) hypertension: Secondary | ICD-10-CM

## 2010-05-24 NOTE — Progress Notes (Signed)
Summary: pt rtn call  Phone Note Outgoing Call   Call placed by: Fredia Beets, RN,  May 18, 2010 3:38 PM Summary of Call: called to let pt know monitor shows sinus with PAC's and rare PVC. Left message to call back Fredia Beets, RN  May 18, 2010 3:39 PM   Follow-up for Phone Call        pt rtn call to get monitor results [name]  May 19, 2010 1:43 PM   pt aware Fredia Beets, RN  May 19, 2010 5:49 PM

## 2010-05-24 NOTE — Patient Instructions (Signed)
FOLLOW UP WITH DR CRENSHAW IN 9 MONTHS. YOU WILL RECEIVE A LETTER WHEN THAT SCHEDULE IS AVAILABLE.

## 2010-05-24 NOTE — Progress Notes (Signed)
HPI: Pleasant female that I seen in the past for paroxysmal atrial fibrillation.  We did perform a Myoview on 01/21/08 that showed normal perfusion and an ejection fraction of 73%. Her last echocardiogram in Feb 2012 showed normal LV function, mild biatrial enlargement, mild MR and trace AI. A previous monitor revealed paroxysmal atrial fibrillation/flutter with a rapid ventricular response. Note she refuses Coumadin. I last saw her in February 2012 and she was complaining of palpitations. A CardioNet was performed and revealed sinus rhythm with PACs and rare PVCs. She continues to have dyspnea on exertion but there is no orthopnea, PND, pedal edema, chest pain or syncope. She occasionally feels a skip but no sustained palpitations.  Current Outpatient Prescriptions  Medication Sig Dispense Refill  . aspirin 81 MG chewable tablet Chew 81 mg by mouth. Two times daily       . Cholecalciferol ([employer] VITAMIN D3) 1000 UNITS capsule Take 1,000 Units by mouth daily.        . flecainide (TAMBOCOR) 50 MG tablet Take 50 mg by mouth 2 (two) times daily.        Marland Kitchen LORazepam (ATIVAN) 1 MG tablet Take 1 mg by mouth. As needed, two times a day       . metoprolol tartrate (LOPRESSOR) 25 MG tablet Take 25 mg by mouth. Take 1/2 tablet twice daily       . omeprazole (PRILOSEC OTC) 20 MG tablet Take 20 mg by mouth daily.        . vitamin B-12 (CYANOCOBALAMIN) 1000 MCG tablet Take 1,000 mcg by mouth. Take 1/2 tablet daily       . DISCONTD: albuterol (VENTOLIN HFA) 108 (90 BASE) MCG/ACT inhaler Inhale 2 puffs into the lungs every 4 (four) hours as needed.           Past Medical History  Diagnosis Date  . Atrial fibrillation   . Arthritis   . Hypertension   . B12 deficiency   . Depression   . Anxiety   . GERD (gastroesophageal reflux disease)   . Vitamin D deficiency disease   . COPD (chronic obstructive pulmonary disease)   . Pneumonia   . Peptic ulcer, unspecified site, unspecified as acute or chronic, without  mention of hemorrhage, perforation, or obstruction   . Osteoarthrosis, hand     both hands    Past Surgical History  Procedure Date  . Bladder surgery     Bladder tack and intestines  . Splenectomy   . Cystocele repair     History   Social History  . Marital Status: Divorced    Spouse Name: N/A    Number of Children: N/A  . Years of Education: N/A   Occupational History  . Not on file.   Social History Main Topics  . Smoking status: Former Research scientist (life sciences)  . Smokeless tobacco: Never Used  . Alcohol Use: No  . Drug Use: No  . Sexually Active: Not on file   Other Topics Concern  . 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 Remaining systems are negative.  Physical Exam: Well-developed well-nourished in no acute distress.  Skin is warm and dry.  HEENT is normal.  Neck is supple. No thyromegaly.  Chest is clear to auscultation with normal expansion.  Cardiovascular exam  is regular rate and rhythm.  Abdominal exam nontender or distended. No masses palpated. Extremities show no edema. neuro grossly intact

## 2010-05-24 NOTE — Assessment & Plan Note (Signed)
Management per primary care. 

## 2010-05-24 NOTE — Assessment & Plan Note (Signed)
Patient remains in sinus rhythm. Continue Toprol, flecanide, and ASA. Refuses coumadin and understands risk of embolic event.

## 2010-05-24 NOTE — Assessment & Plan Note (Signed)
Palpitations appear to be secondary to PACs and rare PVC. Continue beta blocker.

## 2010-05-24 NOTE — Assessment & Plan Note (Signed)
Continue toprol °

## 2010-06-07 ENCOUNTER — Telehealth: Payer: Self-pay | Admitting: *Deleted

## 2010-06-07 NOTE — Telephone Encounter (Signed)
Patient requesting lab order - she is c/o dizzy spells and wants her "blood checked".

## 2010-06-08 NOTE — Telephone Encounter (Signed)
Needs OV or UC visit Thank you!

## 2010-06-08 NOTE — Telephone Encounter (Signed)
Left mess to call office back.   

## 2010-06-09 NOTE — Telephone Encounter (Signed)
Left mess to call office back.   

## 2010-06-13 LAB — CK TOTAL AND CKMB (NOT AT ARMC)
CK, MB: 2.1 ng/mL (ref 0.3–4.0)
Relative Index: INVALID (ref 0.0–2.5)
Total CK: 56 U/L (ref 7–177)

## 2010-06-13 LAB — DIFFERENTIAL
Basophils Absolute: 0.4 10*3/uL — ABNORMAL HIGH (ref 0.0–0.1)
Basophils Relative: 3 % — ABNORMAL HIGH (ref 0–1)
Eosinophils Absolute: 0.2 10*3/uL (ref 0.0–0.7)
Eosinophils Relative: 2 % (ref 0–5)
Lymphocytes Relative: 33 % (ref 12–46)
Lymphs Abs: 4.5 10*3/uL — ABNORMAL HIGH (ref 0.7–4.0)
Monocytes Absolute: 1.1 10*3/uL — ABNORMAL HIGH (ref 0.1–1.0)
Monocytes Relative: 8 % (ref 3–12)
Neutro Abs: 7.3 10*3/uL (ref 1.7–7.7)
Neutrophils Relative %: 54 % (ref 43–77)

## 2010-06-13 LAB — APTT: aPTT: 26 seconds (ref 24–37)

## 2010-06-13 LAB — URINALYSIS, ROUTINE W REFLEX MICROSCOPIC
Bilirubin Urine: NEGATIVE
Glucose, UA: NEGATIVE mg/dL
Hgb urine dipstick: NEGATIVE
Ketones, ur: NEGATIVE mg/dL
Nitrite: NEGATIVE
Protein, ur: NEGATIVE mg/dL
Specific Gravity, Urine: 1.009 (ref 1.005–1.030)
Urobilinogen, UA: 0.2 mg/dL (ref 0.0–1.0)
pH: 6.5 (ref 5.0–8.0)

## 2010-06-13 LAB — CBC
HCT: 39.5 % (ref 36.0–46.0)
Hemoglobin: 13 g/dL (ref 12.0–15.0)
MCHC: 33 g/dL (ref 30.0–36.0)
MCV: 90.1 fL (ref 78.0–100.0)
Platelets: 459 10*3/uL — ABNORMAL HIGH (ref 150–400)
RBC: 4.38 MIL/uL (ref 3.87–5.11)
RDW: 17.4 % — ABNORMAL HIGH (ref 11.5–15.5)
WBC: 13.5 10*3/uL — ABNORMAL HIGH (ref 4.0–10.5)

## 2010-06-13 LAB — BASIC METABOLIC PANEL
BUN: 15 mg/dL (ref 6–23)
CO2: 26 mEq/L (ref 19–32)
Calcium: 9 mg/dL (ref 8.4–10.5)
Chloride: 112 mEq/L (ref 96–112)
Creatinine, Ser: 0.83 mg/dL (ref 0.4–1.2)
GFR calc Af Amer: >60 mL/min (ref >60)
GFR calc non Af Amer: >60 mL/min (ref >60)
Glucose, Bld: 103 mg/dL — ABNORMAL HIGH (ref 70–99)
Potassium: 3.7 mEq/L (ref 3.5–5.1)
Sodium: 144 mEq/L (ref 135–145)

## 2010-06-13 LAB — PROTIME-INR
INR: 0.9 (ref 0.00–1.49)
Prothrombin Time: 11.9 seconds (ref 11.6–15.2)

## 2010-06-13 LAB — D-DIMER, QUANTITATIVE: D-Dimer, Quant: 0.33 ug/mL-FEU (ref 0.00–0.48)

## 2010-06-13 LAB — CARDIAC PANEL(CRET KIN+CKTOT+MB+TROPI)
CK, MB: 1.7 ng/mL (ref 0.3–4.0)
Relative Index: INVALID (ref 0.0–2.5)
Total CK: 45 U/L (ref 7–177)
Troponin I: 0.02 ng/mL (ref 0.00–0.06)

## 2010-06-13 LAB — T4, FREE: Free T4: 0.91 ng/dL (ref 0.80–1.80)

## 2010-06-13 LAB — BRAIN NATRIURETIC PEPTIDE: Pro B Natriuretic peptide (BNP): 122 pg/mL — ABNORMAL HIGH (ref 0.0–100.0)

## 2010-06-13 LAB — TSH: TSH: 4.267 u[IU]/mL (ref 0.350–4.500)

## 2010-06-13 LAB — TROPONIN I: Troponin I: 0.06 ng/mL (ref 0.00–0.06)

## 2010-06-13 NOTE — Telephone Encounter (Signed)
Pt c/o dizzyness off and on, scheduled ov next week. Pt declined apt sooner w/another MD

## 2010-06-21 ENCOUNTER — Telehealth: Payer: Self-pay | Admitting: *Deleted

## 2010-06-21 NOTE — Telephone Encounter (Signed)
OK to fill this prescription with additional refills x1 Thank you!  

## 2010-06-21 NOTE — Telephone Encounter (Signed)
rec req for Lorazepam 1 mg 1 po bid prn.... # 60.... Last filled 05-16-10. Ok to Rf?

## 2010-06-22 ENCOUNTER — Ambulatory Visit (INDEPENDENT_AMBULATORY_CARE_PROVIDER_SITE_OTHER): Payer: Medicare Other | Admitting: Internal Medicine

## 2010-06-22 ENCOUNTER — Encounter: Payer: Self-pay | Admitting: Internal Medicine

## 2010-06-22 DIAGNOSIS — J209 Acute bronchitis, unspecified: Secondary | ICD-10-CM

## 2010-06-22 DIAGNOSIS — H6120 Impacted cerumen, unspecified ear: Secondary | ICD-10-CM

## 2010-06-22 DIAGNOSIS — J309 Allergic rhinitis, unspecified: Secondary | ICD-10-CM

## 2010-06-22 DIAGNOSIS — F411 Generalized anxiety disorder: Secondary | ICD-10-CM

## 2010-06-22 DIAGNOSIS — E538 Deficiency of other specified B group vitamins: Secondary | ICD-10-CM

## 2010-06-22 MED ORDER — AZITHROMYCIN 250 MG PO TABS: ORAL_TABLET | ORAL | Status: AC

## 2010-06-22 MED ORDER — FLUTICASONE PROPIONATE 50 MCG/ACT NA SUSP: 2.0000 | Freq: Every day | NASAL | Status: DC

## 2010-06-22 MED ORDER — PREDNISONE 10 MG PO TABS: ORAL_TABLET | ORAL | Status: AC

## 2010-06-22 MED ORDER — LORAZEPAM 1 MG PO TABS: 1.0000 mg | ORAL_TABLET | Freq: Three times a day (TID) | ORAL | Status: DC | PRN

## 2010-06-22 NOTE — Assessment & Plan Note (Signed)
Zpac 

## 2010-06-22 NOTE — Assessment & Plan Note (Signed)
On Rx 

## 2010-06-22 NOTE — Assessment & Plan Note (Signed)
Refill meds

## 2010-06-22 NOTE — Progress Notes (Signed)
  Subjective:    Patient ID: [name], female    DOB: 02/16/1936, 75 y.o.   MRN: [id number]  HPI   C/o being stopped up and dizzy from allergies x 1 month - worse. No fever. OTC meds do not help. She can't breath. C/o wax Review of Systems  Constitutional: Positive for chills and fatigue. Negative for fever and activity change.  HENT: Positive for ear pain, nosebleeds, congestion, rhinorrhea, sneezing and postnasal drip. Negative for facial swelling and neck stiffness.   Eyes: Negative for pain.  Respiratory: Positive for cough, chest tightness, shortness of breath and wheezing.   Cardiovascular: Negative for chest pain, palpitations and leg swelling.  Gastrointestinal: Negative for vomiting.  Genitourinary: Negative for urgency.  Musculoskeletal: Negative for back pain and gait problem.  Skin: Negative for rash.  Neurological: Positive for numbness (R hand).  Psychiatric/Behavioral: Negative for behavioral problems.       Objective:   Physical Exam  Constitutional: She appears well-developed and well-nourished. No distress.  HENT:  Head: Normocephalic.  Right Ear: External ear normal.  Left Ear: External ear normal.  Nose: Nose normal.  Mouth/Throat: Oropharyngeal exudate present.  Eyes: Conjunctivae are normal. Pupils are equal, round, and reactive to light. Right eye exhibits no discharge. Left eye exhibits no discharge.  Neck: Normal range of motion. Neck supple. No JVD present. No tracheal deviation present. No thyromegaly present.  Cardiovascular: Normal rate, regular rhythm and normal heart sounds.   Pulmonary/Chest: Effort normal. No stridor. No respiratory distress. She has no wheezes.       Decreased BS B  Abdominal: Soft. Bowel sounds are normal. She exhibits no distension and no mass. There is no tenderness. There is no rebound and no guarding.  Musculoskeletal: She exhibits no edema and no tenderness.  Lymphadenopathy:    She has no cervical adenopathy.    Neurological: She displays normal reflexes. No cranial nerve deficit. She exhibits normal muscle tone. Coordination normal.  Skin: No rash noted. No erythema.  Psychiatric: She has a normal mood and affect. Her behavior is normal. Judgment and thought content normal.  Wax B        Assessment & Plan:   ALLERGIC RHINITIS Severe - it has deteriorated.  Prednisone 10 mg: take 4 tabs a day x 3 days; then 3 tabs a day x 4 days; then 2 tabs a day x 4 days, then 1 tab a day x 6 days, then stop. Take pc. Flonase  ANXIETY Refill meds  BRONCHITIS, ACUTE Zpac  B12 DEFICIENCY On Rx  CERUMEN IMPACTION I attempted  To remove w/a loop - too painful - I stopped.

## 2010-06-22 NOTE — Assessment & Plan Note (Signed)
I attempted  To remove w/a loop - too painful - I stopped.

## 2010-06-22 NOTE — Assessment & Plan Note (Signed)
Severe - it has deteriorated.  Prednisone 10 mg: take 4 tabs a day x 3 days; then 3 tabs a day x 4 days; then 2 tabs a day x 4 days, then 1 tab a day x 6 days, then stop. Take pc. Flonase

## 2010-06-24 NOTE — Telephone Encounter (Signed)
Rf phoned in.  

## 2010-06-25 ENCOUNTER — Emergency Department (HOSPITAL_COMMUNITY)
Admission: EM | Admit: 2010-06-25 | Discharge: 2010-06-25 | Disposition: A | Discharge status: Home / Self Care | Payer: Medicare Other | Attending: Emergency Medicine | Admitting: Emergency Medicine

## 2010-06-25 ENCOUNTER — Emergency Department (HOSPITAL_COMMUNITY): Payer: Medicare Other

## 2010-06-25 DIAGNOSIS — M79609 Pain in unspecified limb: Secondary | ICD-10-CM | POA: Insufficient documentation

## 2010-06-25 DIAGNOSIS — M129 Arthropathy, unspecified: Secondary | ICD-10-CM | POA: Insufficient documentation

## 2010-06-25 DIAGNOSIS — S5010XA Contusion of unspecified forearm, initial encounter: Secondary | ICD-10-CM | POA: Insufficient documentation

## 2010-06-25 DIAGNOSIS — J438 Other emphysema: Secondary | ICD-10-CM | POA: Insufficient documentation

## 2010-06-25 DIAGNOSIS — Z7982 Long term (current) use of aspirin: Secondary | ICD-10-CM | POA: Insufficient documentation

## 2010-06-25 DIAGNOSIS — Z79899 Other long term (current) drug therapy: Secondary | ICD-10-CM | POA: Insufficient documentation

## 2010-06-25 DIAGNOSIS — Y92009 Unspecified place in unspecified non-institutional (private) residence as the place of occurrence of the external cause: Secondary | ICD-10-CM | POA: Insufficient documentation

## 2010-06-25 DIAGNOSIS — W010XXA Fall on same level from slipping, tripping and stumbling without subsequent striking against object, initial encounter: Secondary | ICD-10-CM | POA: Insufficient documentation

## 2010-06-25 DIAGNOSIS — I10 Essential (primary) hypertension: Secondary | ICD-10-CM | POA: Insufficient documentation

## 2010-07-04 ENCOUNTER — Telehealth: Payer: Self-pay | Admitting: Cardiology

## 2010-07-04 NOTE — Telephone Encounter (Signed)
Faxed OV & EKG to Karen Kitchens @ [place] ([phone]).

## 2010-07-13 ENCOUNTER — Telehealth: Payer: Self-pay | Admitting: *Deleted

## 2010-07-13 NOTE — Telephone Encounter (Signed)
OK to fill this prescription with additional refills x0 OV if sick Thank you!

## 2010-07-13 NOTE — Telephone Encounter (Signed)
rec rf req for Hydrco/Homatropine syrup.... 5 mls po up to QID.  382ml. Last filled 11-12-09... Ok to Rf??

## 2010-07-14 MED ORDER — HYDROCODONE-HOMATROPINE 5-1.5 MG/5ML PO SYRP: 5.0000 mL | ORAL_SOLUTION | Freq: Four times a day (QID) | ORAL | Status: AC

## 2010-07-14 NOTE — Telephone Encounter (Signed)
rf phoned in. Pt informed  

## 2010-07-19 NOTE — H&P (Signed)
[name], [name] NO.:  [id number]   MEDICAL RECORD NO.:  [id number]          PATIENT TYPE:  EMS   LOCATION:  MAJO                         FACILITY:  [facility]   PHYSICIAN:  Loralie Champagne, MD      DATE OF BIRTH:  06/21/1935   DATE OF ADMISSION:  11/28/2007  DATE OF DISCHARGE:                              HISTORY & PHYSICAL   PRIMARY CARDIOLOGIST:  Denice Bors. Stanford Breed, MD.   PRIMARY CARE PHYSICIAN:  Evie Lacks. Plotnikov, MD.   HISTORY OF PRESENT ILLNESS:  This is a 75 year old with a history of  paroxysmal atrial fibrillation and COPD who presents after an episode of  severe heart racing and palpitations, this occurred at 4 p.m. today.  The patient was sitting in a chair then she felt her heart racing.  She  could not catch her breath.  She stood up and then everything went  black.  She was lightheaded.  She did not pass out.  She sat down and  the episode resolved.  She came to the Emergency Department.  She has  continued to have palpitations ever since then.  She has had significant  amount of stress in her family recently.  Of note, her episodes of  atrial fibrillation in the past have always had a stress setting that  amounts to surgery or sepsis.  She has had no chest pain.  She had  baseline dyspnea on exertion, this has been attributed to COPD.  She  gets short of breath just walking around her house.  She has no  orthopnea.  No PND.  EKGs here showed frequent PACs and a short run of  atrial fibrillation.  Telemetry showed a short run of atrial  fibrillation.   MEDICATIONS:  1. Toprol-XL 25 mg daily.  2. Seroquel 25 mg nightly.  3. Aspirin 81 mg daily.   PAST MEDICAL HISTORY:  1. Paroxysmal atrial fibrillation and this has always occurred in the      setting of a stress such as surgery or sepsis that occurred      postoperatively after her splenectomy and after her rectocele      repair.  2. COPD.  Her chronic dyspnea on exertion has been attributed  to this.      She is a heavy smoker, but quit 30 years ago.  3. Nuclear study in 2004 with no ischemia and normal LV function.  4. Echocardiogram in July 2007, showed a EF of 65%, no regional wall      motion abnormalities, and normal artery function.  5. Autoimmune hemolytic anemia status post splenectomy.  6. History of cystocele and rectocele repair.  7. Question history of hypertension.  The patient's blood pressure is      153/95 tonight.   SOCIAL HISTORY:  The patient lives in [place] alone.  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  She is here with her sister.  She quit smoking 3 years  ago.  She does not use any alcohol.   FAMILY HISTORY:  The patient's father died of a heart attack in his 41s.  REVIEW OF SYSTEMS:  Negative, except as noted in the history of present  illness.   PHYSICAL EXAMINATION:  VITAL SIGNS:  The patient is afebrile, heart rate  has ranged from the 60s to 135.  She was in AFib in 135.  Blood pressure  is 153/95.  GENERAL:  This is a well developed elderly female in no apparent  distress.  NEUROLOGIC:  Alert and oriented x3.  Normal affect.  HEENT:  Normal exam.  ABDOMEN:  Soft and nontender.  No hepatosplenomegaly.  NECK:  Supple without lymphadenopathy.  There is no JVD.  No thyromegaly  or thyroid nodule.  CARDIOVASCULAR:  Heart irregular.  S1 and S2.  There is no murmur.  There is no S3 and no S4.  EXTREMITIES:  There is a soft right carotid bruit.  Pulses are 2+ and  equal bilaterally at the posterior tibial arteries.  There is no edema.  No clubbing or cyanosis.  LUNGS:  There are dry crackles at the bases bilaterally.  SKIN:  No rash.  MUSCULOSKELETAL:  Normal exam.   Chest x-ray shows evidence of emphysema and no pulmonary edema or  pneumonia.  EKG shows sinus rhythm with frequent PACs including runs of  2-3 PACs and a rub.  Later EKG does show brief run of atrial  fibrillation and atrial fibrillation was also noted on telemetry.   LABORATORY  DATA:  White count 12.6, hematocrit 41, and platelets 412.  Sodium 141, potassium 4.4, BUN 11, and creatinine 0.8.  First set of  cardiac enzymes was negative.  INR 0.9.   ASSESSMENT AND PLAN:  This is a 75 year old with history of paroxysmal  atrial fibrillation and chronic obstructive pulmonary disease who  presented with an episode of heart racing associated with presyncope.  She has frequent PACs and brief runs of atrial fibrillation on  telemetry.  1. Heart racing/presyncope.  Telemetry in the emergency room shows      brief bursts of atrial fibrillation along with sinus rhythm and      frequent PACs.  The patient has a history of paroxysmal atrial      fibrillation, which has been associated with stressors such as      surgery in the past.  Her symptoms are likely secondary to atrial      fibrillation with rapid ventricular response.  We are going to      admit her and monitor her on telemetry to see how much atrial      fibrillation she is having.  We are going to start her on Lopressor      12.5 mg q.6 h. and increase as tolerated to keep her rate      controlled.  I am going to go ahead and start her on Lovenox 1 mg      per kilogram b.i.d. for now due to a question of whether or not she      will need anticoagulation or not in the future.  Finally, I will      consider this patient for flecainide in the future to maintain her      in sinus rhythm if she continues to have very frequent atrial      fibrillation.  She does have a structurally normal heart rate with      no known coronary artery disease.  2. No history of coronary artery disease.  We will rule out myocardial      infarction.  First set of cardiac enzymes was negative.  We will      continue her on aspirin.  3. Chronic dyspnea on exertion.  This is likely secondary to chronic      obstructive pulmonary disease.  The patient is not volume      overloaded on exam.  I will check a BNP.  4. The patient's white count  is slightly elevated.  We will go ahead      and check the urinalysis to make sure she does not have a urinary      tract infection with a possible stressor in the setting of these      short bursts of atrial fibrillation.      Loralie Champagne, MD  Electronically Signed     DM/MEDQ  D:  11/28/2007  T:  11/29/2007  Job:  [id number]

## 2010-07-19 NOTE — Discharge Summary (Signed)
[name], [name] NO.:  [id number]   MEDICAL RECORD NO.:  [id number]          PATIENT TYPE:  INP   LOCATION:  [id number]                         FACILITY:  [facility]   PHYSICIAN:  Fay Records, MD, FACCDATE OF BIRTH:  [birth year]   DATE OF ADMISSION:  11/28/2007  DATE OF DISCHARGE:  11/29/2007                         DISCHARGE SUMMARY - REFERRING   DISCHARGE DIAGNOSES:  1. Paroxysmal atrial fibrillation.  2. Hyperlipidemia.  3. History as noted below.   BRIEF HISTORY:  [name] is a 75 year old female who presents to [facility] with palpitations.  She stated this occurred  approximately 4 p.m. on the day of admission while sitting in a chair.  Everything was associated with shortness of breath.  When she stood up,  she felt that everything went black and she became very lightheaded,  although she denies actual syncope.  When she sat down, her symptoms  improved.  She is still having palpitations and does describe increased  family stress recently.  She denies any chest discomfort in her baseline  shortness of breath associated with exertion has not changed.  In the  emergency room, she did have a short run of atrial fibrillation, then  sinus rhythm and PACs on the EKGs.  However, telemetry showed atrial  fibrillation.   PAST MEDICAL HISTORY:  1. Her history is notable for chronic dyspnea on exertion with COPD.      Negative stress test in 2004 with preserved LV function.  2. Autoimmune hemolytic anemia with splenectomy.  3. Hypertension.  4. Remote tobacco use.   LABORATORY DATA:  Urinalysis was unremarkable.  Urine culture is  pending.  Admission weight was 60.5 kg. H&H on admission was 13.6 and  41.0, normal indices, platelets 412, WBCs 12.6, PT 12.1, sodium 142,  potassium 3.9, BUN 9, creatinine 0.79, glucose 49.  However, recheck  showed a glucose of 146.  CK-MB, relative index and troponins did not  reveal an injury pattern.  BNP on November 29, 2007 was 255.  Fasting  lipids showed a total cholesterol 224, triglycerides 140, HDL 42, LDL  154.   DIAGNOSTICS:  1. Chest x-ray showed no acute disease, emphysema and atherosclerosis.  2. EKGs on admission showed atrial fibrillation intermixed with sinus      rhythm and PACs.   HOSPITAL COURSE:  [name] was admitted to [facility].  Telemetry monitor continued to show sinus rhythm with PACs.  She was  placed on subcutaneous Lovenox and her Toprol was increased to 50 mg  daily.  The morning of November 29, 2007 after assessed by Dr. Harrington Challenger  with her Coffey score of 1, Dr. Harrington Challenger recommended Coumadin to [name],  however, [name] refused.  Dr. Harrington Challenger had a BMET checked as well as a TSH  drawn STAT and felt that if she was ambulating, she could potentially be  discharged home in the afternoon with followup as an outpatient.  It is  noted at the time of discharge, her TSH is pending.  However, a TSH in  July 2008 was within normal  limits at 0.7.   DISPOSITION:  [name] is discharged home on November 29, 2007.   DISCHARGE INSTRUCTIONS:  1. She is asked to maintain a low-sodium, heart-healthy diet.  2. She was also asked to avoid pseudoephedrine products and caffeine.  3. Her aspirin was increased to 325 mg daily.  4. Her Toprol was increased to 50 mg daily.  5. She was given permission to include Seroquel 25 mg q.h.s.; vitamin      D and vitamin B12 as previously.  6. She will have an echocardiogram on December 10, 2007 at 9:30.  Our      office will call her with an event monitor.  7. She was asked to bring in all medications to all appointments.  8. She will follow up with Dr. Stanford Breed on December 24, 2007 at 8:45.  9. Dr. Harrington Challenger plans to speak with Dr. Stanford Breed in regards to      anticoagulation issues with [name] as well as reviewing her lipid      status.  It is noted at the time of this dictation her TSH is      pending.   Discharge time 35 minutes.      Sharyl Nimrod,  PA-C      Fay Records, MD, [facility]  Electronically Signed    EW/MEDQ  D:  11/29/2007  T:  11/29/2007  Job:  [id number]   cc:   Evie Lacks. Plotnikov, MD  Denice Bors. Stanford Breed, MD, [facility]

## 2010-07-19 NOTE — Assessment & Plan Note (Signed)
[place] OFFICE NOTE   NAME:[name]                        MRN:          [id number]  DATE:04/03/2007                            DOB:          [birth year]    [name] is a very pleasant 75 year old female who has a history of  atrial fibrillation occurring in the setting of sepsis and also in the  postoperative setting.  She also has COPD.  She has had a previous  nuclear study in 2004 that showed no ischemia and normal LV function and  previous echocardiogram performed in 2005 showed no significant  abnormalities.  Since I last saw her, she continues to have dyspnea on  exertion which has been attributed to COPD.  There is no orthopnea, PND,  pedal edema, palpitations, presyncope, syncope or chest pain.  She  discontinued her tobacco use approximately one and a half years ago.   MEDICATIONS:  1. Toprol 25 mg daily.  2. Seroquel 25 mg p.o. q.h.s.  3. Aspirin 81 mg daily.  4. Vitamin D3.   PHYSICAL EXAMINATION:  VITAL SIGNS:  Blood pressure of 134/72 and pulse  is 61.  She weighs 137 pounds.  HEENT:  Normal.  NECK:  Supple.  No bruits noted.  CHEST:  Clear.  CARDIOVASCULAR:  Regular rate.  ABDOMEN:  No pulsatile masses.  No bruits.  EXTREMITIES:  No edema.   STUDIES:  Electrocardiogram shows sinus rhythm at a rate of 61.  There  are no significant ST changes.   DIAGNOSIS:  1. History of atrial fibrillation occurring in the setting of sepsis      and also in the postoperative setting.  Note, she remains in sinus      rhythm and her LV function is normal.  We will continue with her      Toprol as well as her aspirin.  I do not think she needs Coumadin      given that her atrial fibrillation occurred with the stress of      surgery and sepsis.  2. COPD - I think this explains her dyspnea.  She will continue to      have this managed by her primary care physician.  3. Tobacco abuse, now resolved.  4. Preserved LV function.   We will see her back in one year.     Denice Bors Stanford Breed, MD, [facility]  Electronically Signed    BSC/MedQ  DD: 04/03/2007  DT: 04/03/2007  Job #: [id number]

## 2010-07-19 NOTE — H&P (Signed)
[name], [name]                 ACCOUNT NO.:  [id number]   MEDICAL RECORD NO.:  [id number]          PATIENT TYPE:  INP   LOCATION:  2923                         FACILITY:  [facility]   PHYSICIAN:  Darryl D. Prime, MD    DATE OF BIRTH:  02-12-1936   DATE OF ADMISSION:  08/12/2008  DATE OF DISCHARGE:                              HISTORY & PHYSICAL   PRIMARY CARE PHYSICIAN:  Dr. Alain Marion.   CARDIOLOGIST:  Dr. Stanford Breed of [facility].   CHIEF COMPLAINT:  Heart racing and shortness of breath.   HISTORY OF PRESENT ILLNESS:  [name] is a 75 year old female who has a  history of paroxysmal atrial fibrillation.  She does not take aspirin  because she notes it causes stomach ulcers.  She was tried on Coumadin  in November of 2009.  She stopped taking it after having a diffuse rash.  She had since an event monitor placed, and on 8:30 p.m. August 12, 2008, at  rest she developed tachy palpitations with associated with shortness of  breath.  No chest pain or presyncope.  The cardiac monitoring device  company did call her noting that her heart rate was in the 100s. They  then contacted me, and I contacted her to have her come into the  emergency room.  The patient's heart rates have been in the range of 100  to 150 intermittently atrial fibrillation with normal response and then  rapid ventricular response.  She has had no chest pain or shortness of  breath since the first episode.  The patient denies any recent dysuria,  hematuria.  She denies any black stools or bloody stools.  No nausea or  vomiting.  No diarrhea.  She has been taking prednisone 10 mg a day for  the last 3 months for arthritis particularly in the hands.  She lives  alone and has been independent.   PAST MEDICAL AND SURGICAL HISTORY:  Is as above.  She also has a history  of hypertension, peripheral vascular disease, history of pneumonia,  history of COPD, history of degenerative joint disease, status post  hysterectomy.   ALLERGIES:  She is allergic to CODEINE and possibly to COUMADIN.  The  patient notes an intolerance to ASPIRIN as above.  She notes DILTIAZEM  in the past made her feel drunk.  PHENERGAN causes disorientation.  NAPROXEN caused nausea and vomiting.  She notes she does not like to  take medications in general.   MEDICATIONS:  Are as above:  1. Prednisone 10 mg daily.  2. Metoprolol XL 50 daily.  3. B12.  4. Multivitamin.  5. Vitamin D.   SOCIAL HISTORY:  Again, she lives alone in [place].  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  She quit smoking 5 years ago but long-standing tobacco use in the past.  No alcohol.   FAMILY HISTORY:  Father died of a myocardial infarction in his 74s.   REVIEW OF SYSTEMS:  A 14-point review of systems is negative unless  stated above.  PHYSICAL EXAMINATION:  VITAL SIGNS:  Temperature is 97.5 with a blood  pressure of 131/50, pulse of 82, respiratory rate of 16, saturation 95%  on room air.  GENERAL:  She is a female who looks much younger than her stated age,  upright in no acute distress.  HEENT:  Normocephalic, atraumatic.  Pupils are equal, round, and  reactive to light.  Extraocular movements intact.  Oropharynx reveals no  posterior pharyngeal lesions.  NECK:  Is supple with no lymphadenopathy or thyromegaly.  No carotid  bruits.  No jugular venous distention.  LUNGS:  Clear to auscultation bilaterally.  CARDIOVASCULAR EXAM:  Is irregularly irregular and fast.  ABDOMEN:  Soft, nontender, nondistended with no hepatosplenomegaly and  normal active bowel sounds.  EXTREMITIES:  Showed no clubbing, cyanosis, or edema.  NEUROLOGIC EXAM:  She is alert and oriented x4.  Cranial nerves II-XII  grossly intact.  Strength and sensation grossly intact.  SKIN:  Shows no rashes.  MUSCULOSKELETAL EXAM:  Reveals full range of motion with no evidence of  joint deformities.  PSYCHIATRIC EXAM:  She is alert and  oriented x4.  Mood and affect within  normal limits.   LABORATORY DATA:  Her potassium is 3.7, sodium 144, chloride 112, CO2  26, glucose 103, BUN 15, creatinine 0.83.  White count is 13.5,  hemoglobin 13, hematocrit 39.5, platelets 459, segs of 54, lymphocytes  of 33.  EKG shows atrial fibrillation at a vent rate of 120 beats per  minute, much faster compared to the range of the previous EKGs with rate  on last hospitalization in September of 2009.  The patient's chest x-ray  shows chronic bronchitic changes with biapical scarring consistent with  COPD.   ASSESSMENT AND PLAN:  The patient with a history of paroxysmal atrial  fibrillation.  She has recently been on long-standing prednisone.  I am  not sure if this was prescribed in this manner by a physician.  The  patient is here now with paroxysmal atrial fibrillation with rapid  ventricular response and is symptomatic with this.  At this time, we  will have her admitted, and she will be on Cardizem and will give her  Toprol-XL 50 twice a day.  Cardizem drip will be titrated to keep her  heart rate in a reasonable range.  We will check a D-dimer to rule out  pulmonary embolism, cardiac markers x3 to rule out acute coronary  syndrome, and she will be on a heparin drip to reduce the risk of stroke  and eventually convert.  The patient has not been on Coumadin, and I am  not sure if her physicians are aware of this.  Gastrointestinal and deep  venous thrombosis prophylaxis will be ordered.      Darryl D. Prime, MD  Electronically Signed     DDP/MEDQ  D:  08/13/2008  T:  08/13/2008  Job:  [id number]

## 2010-07-19 NOTE — Discharge Summary (Signed)
[name], [name] NO.:  [id number]   MEDICAL RECORD NO.:  [id number]          PATIENT TYPE:  INP   LOCATION:  2923                         FACILITY:  [facility]   PHYSICIAN:  Shaune Pascal. Bensimhon, MDDATE OF BIRTH:  1935-03-16   DATE OF ADMISSION:  08/12/2008  DATE OF DISCHARGE:  08/13/2008                               DISCHARGE SUMMARY   PROCEDURES:  None.   PRIMARY FINAL DISCHARGE DIAGNOSIS:  Paroxysmal atrial fibrillation with  rapid ventricular response.   SECONDARY DIAGNOSES:  1. Family history of coronary artery disease, not premature.  2. Remote history of tobacco use.  3. Allergy or intolerance to COUMADIN, CODEINE, DILTIAZEM, PHENERGAN,      and NAPROXEN.  4. Arthritis.  5. Hypertension.  6. Peripheral vascular disease.  7. Remote history of pneumonia.  8. History of chronic obstructive pulmonary disease.  9. Status post hysterectomy.   TIME OF DISCHARGE:  36 minutes.   HOSPITAL COURSE:  [name] is a 75 year old female with a history of  paroxysmal atrial fibrillation.  She felt her heart racing and short of  breath, so she came to the hospital where she was in rapid AFib.  She  was admitted for further evaluation and treatment.   [name] was initially put on IV Cardizem.  She was continued on her beta-  blocker.  She had been on flecainide in the past as well as Coumadin,  but Coumadin was stopped secondary to a rash.  She refuses to try it  again.  She was requested to take a full-strength aspirin 325, but  refused this as well.  She agreed to aspirin at 162 mg daily and is  aware of the stroke risk.   On August 13, 2008, Dr. Haroldine Laws evaluated [name] and found her to be  back in sinus rhythm.  He felt that she could be started on flecainide  75 mg b.i.d. to maintain sinus rhythm and is considered stable for  discharge with close outpatient followup.   DISCHARGE INSTRUCTIONS:  1. Her activity level is to be increased gradually.  2. She is  to stick to a low-sodium, heart-healthy diet.  3. She is to follow up with Dr. Stanford Breed on September 18, 2008, at 3:15 and      with Dr. Alain Marion as needed.   DISCHARGE MEDICATIONS:  1. Metoprolol 25 mg b.i.d.  2. Lorazepam 1 mg b.i.d.  3. Vitamin B12 and vitamin D daily.  4. Prednisone 10 mg daily.  5. Aspirin 81 mg 2 tabs daily.  6. Flecainide 50 mg 1-1/2 tablet b.i.d.  7. She is to stop metoprolol ER 25 mg.  8. The patient has prescriptions for Ambien 10 mg nightly and Valium      10 mg b.i.d., but the patient states she is not currently taking      these.      Rosaria Ferries, PA-C      Shaune Pascal. Bensimhon, MD  Electronically Signed    RB/MEDQ  D:  08/13/2008  T:  08/14/2008  Job:  [id number]   cc:   Tyrone Apple  Avel Sensor, MD

## 2010-07-19 NOTE — Assessment & Plan Note (Signed)
[facility]                            CARDIOLOGY OFFICE NOTE   NAME:[name], [name]                        MRN:          [id number]  DATE:01/17/2008                            DOB:          [birth year]    [name] is a pleasant 75 year old female who has a history of  paroxysmal atrial fibrillation.  She was recently admitted to [facility] with recurrent palpitations.  She was found to be in atrial  fibrillation.  This was associated with palpitations and dyspnea.  She  did rule out for myocardial infarction with serial enzymes.  A TSH was  normal.  She also had a followup echocardiogram performed, which showed  normal LV function and there was no significant valvular abnormalities.  Note, the patient has had a previous Myoview performed in July 2004 that  showed an ejection fraction of 64% and there was no significant ischemia  noted.  The patient had her Toprol increased from 25 to 50 mg p.o.  daily.  She also had a CardioNet monitor placed.  This was performed on  December 11, 2007 to December 31, 2007.  She was found to have paroxysmal  atrial fibrillation with occasional rapid ventricle response.  She does  note these episodes that she has palpitations associated with mild  dyspnea.  However, she has not had chest pain.  She does have chronic  dyspnea on exertion attributed to COPD.  However, there was no  orthopnea, PND, or pedal edema.  There was no exertional chest pain.  Also note she has not had any bleeding.   Her medications include,  1. Seroquel 25 mg p.o. at bedtime.  2. Vitamin D3.  3. Vitamin B 12.  4. Toprol 50 mg p.o. daily.   Her physical exam today shows a blood pressure of 112/73 and her pulse  is 60.  She weighs 130 pounds.  Her HEENT is normal.  Her neck is  supple.  Her chest is clear.  Cardiovascular exam reveals a regular rate  and rhythm.  Her abdominal exam shows no tenderness.  Extremities showed  no edema.   Her electrocardiogram shows a sinus rhythm at a rate of 58.  There were  no significant ST changes noted.   DIAGNOSES:  1. Paroxysmal atrial fibrillation - Mrs. Fatemi continues to have      paroxysmal atrial fibrillation on her CardioNet and also has had      symptoms with this including palpitations and shortness of breath.      We will continue with her Toprol 50 mg p.o. daily.  Given that she      is continuing to have episodes of this, I will begin flecainide 50      mg p.o. b.i.d. to help maintain sinus rhythm.  We will schedule her      to have a stress Myoview to exclude a ventricular tachycardia early      next week.  This will also help [place] to exclude ischemia, although a      previous Myoview has shown no evidence of ischemic  heart disease.      She also has a CHADS 2 score of 1 for hypertension, but is also 75      years old.  Given that she is a female, I think her risk of embolic      event is increased.  We discussed this and we will begin Coumadin.      I will give her 5 mg p.o. daily for 2 days and then begin 5 mg      alternating with 2.5 mg.  I will have her seen in the Coumadin      Clinic in followup and our goal INR will be 2 to 3.  2. Chronic obstructive pulmonary disease - I think this is most likely      causing her dyspnea and this is being managed by her primary care      physician.  3. Palpitations - as per above.   I will see her back in 8 weeks.     Denice Bors Stanford Breed, MD, [facility]  Electronically Signed    BSC/MedQ  DD: 01/17/2008  DT: 01/18/2008  Job #: [id number]

## 2010-07-22 NOTE — Group Therapy Note (Signed)
[name], SHERIDAN NO.:  [id number]   MEDICAL RECORD NO.:  [id number]          PATIENT TYPE:  WOC   LOCATION:  [facility]                   FACILITY:  [facility]   PHYSICIAN:  Willey Blade, MD DATE OF BIRTH:  1935/07/01   DATE OF SERVICE:  11/10/2005                                    CLINIC NOTE   REASON FOR VISIT:  Postoperative evaluation and lichen sclerosis.   HISTORY OF PRESENT ILLNESS:  This patient is a 75 year old Caucasian female  who underwent on August 29, 2005 an anterior posterior enterocele repair, TVT  procedure, cystoscopy and uterosacral ligament vaginal vault suspension.  The patient's diagnoses include a third degree rectocele, second degree  cystocele, genuine urinary stress incontinence, enterocele and partial  vaginal vault prolapse.  Her postoperative course was complicated after  discharge by pneumonia for which she was treated at [facility].  At  this time, the patient has no specific complaints.  She is constipated but  has had this problem throughout most of her adult life.  She has minimal  complaints of tenderness in the lower abdominal region.  The patient also  has a known diagnosis of lichen sclerosis atrophica of the vulva for she  will presently be treated as well.  This was diagnosed on pathology.   PLAN:  The patient was advised to use Citrucel tablets 1/2 to 1 daily as  needed at night.  She was also advised to try and avoid constipation with  increasing roughage and fruit and vegetables.  Clobetasol 0.5% was  prescribed to be used at night twice a week indefinitely.  No other concerns  or questions were prompted by the patient.           ______________________________  Willey Blade, MD     SHB/MEDQ  D:  11/10/2005  T:  11/10/2005  Job:  [id number]

## 2010-07-22 NOTE — Consult Note (Signed)
NAMEAMAI, WHITTON                           ACCOUNT NO.:  [id number]   MEDICAL RECORD NO.:  [id number]                   PATIENT TYPE:  INP   LOCATION:  [id number]                                 FACILITY:  [facility]   PHYSICIAN:  Virgie Dad. Magrinat, M.D.            DATE OF BIRTH:  20-Jul-1935   DATE OF CONSULTATION:  08/02/2002  DATE OF DISCHARGE:                                   CONSULTATION   HISTORY:  [name] is a 75 year old [place] woman referred by Dr. Jenny Reichmann  with a new diagnosis of autoimmune hemolytic anemia.   The patient tells me that she has felt tired and short of breath for about a  month.  She has continued to work, however.  She presented to Dr. Gwynn Burly  office with these complaints and a CBC was obtained (Jul 31, 2002), which  showed a white cell count of 13 and platelet count of 146,000 but a  hemoglobin of 5.9 with an MCV of 114.  The differential was relatively  normal.  There were some nucleated red blood cells on the peripheral smear.  The patient was admitted.  She was transfused with two units of packed red  cells and a DAT was obtained which is positive for an IGG antibody.  Other  labs are discussed below.  With this information, the patient is referred  for further evaluation.   PAST MEDICAL HISTORY:  1. Ongoing tobacco abuse.  2. Chronic obstructive pulmonary disease.  3. Hyperactive bladder.  4. History of benign breast biopsy.  5. History of simple hysterectomy.  6. History of tonsillectomy and adenoidectomy.  7. History of appendectomy.   FAMILY HISTORY:  The patient's father died from myocardial infarction.  The  patient's mother is alive at age 57, and in fact she is in this hospital at  this point secondary to a leg fracture.  The patient has a brother who died  from heart disease, a sister who is present today and there is another  brother living.   GYNECOLOGIC HISTORY:  She is G2, P2.  She does not know when she went  through menopause.   SOCIAL HISTORY:  The patient works as a Training and development officer at [employer].  She is  divorced and lives by herself.  Her daughter, Stana Bunting, lives in  [place] and is severely disabled secondary to diabetes.  Her son, Dominica Severin  ________, lives in [place].  She does not feel either of her children would  be able to help her but her sister, Iris Pert, who is retired would be  able to help her, and Barbara's phone number is [phone].   HEALTH MAINTENANCE:  The patient had a gynecologic exam in November of 2003.  She has not had a mammogram in three years.  She does not know her  cholesterol level.  She smokes about a pack per day or less.  There  is no  history of ETOH abuse.  She has had a flu shot but not a Pneumovax.  She  does have a living will or health care power of attorney.   ALLERGIES:  No known drug allergies.   CURRENT MEDICATIONS:  She was on no medications at home.   REVIEW OF SYSTEMS:  GENERAL:  As stated, she was tired and short of breath  over the last month without dizziness.  No history of falls.  HEMOPOIETIC:  She  has not noted any melena, bright red blood per rectum, hematuria, or  any other site of bleeding.  INFECTIOUS DISEASE:  She has had no fever,  rash, or any evidence of infection in the past six to eight weeks that she  is aware of.  GU:  She says her urine is dark.  GI:  She has a history of  constipation.  The rest of the review of systems was unremarkable.   PHYSICAL EXAMINATION:  GENERAL:  She is a middle-aged white female.  VITAL SIGNS:  Temperature 97.0, pulse 80, respiratory rate 20, blood  pressure 110/64, she weighs 115 pounds.  HEENT:  The oropharynx is clear.  NECK:  Supple.  I do not palpate any peripheral adenopathy.  BREASTS:  Exam was deferred.  LUNGS:  No crackles or wheezes.  HEART:  Regular rhythm and rate.  ABDOMEN:  Benign.  MUSCULOSKELETAL:  No peripheral edema.   LABORATORY DATA:  On admission the patient's hemoglobin was 6.6,  after  transfusion of two units it was 8.9, and a platelet count currently is  114,000, the white cell count 11.0.  The reticulocyte count is 371.  CMET is  significant for a potassium of 3.3, CO2 23, BUN 11, creatinine 0.9, total  bilirubin 1.6, SGOT 78, SGPT 23, calcium 8.4.  Her B12 is 197, which is  minimally low/indeterminate, folate is 14.4, Ferritin 16.  As stated, the  DAT is positive for an IGG.   IMPRESSION:  The patient is a 75 year old [place] woman with a new  diagnosis of hemolytic anemia based on severe anemia, marked  reticulocytosis, and positive DAT.   Review of the blood film is unremarkable.  Of course, the blood film I saw  is post-transfusion.  There are nucleated red cells and some spherocytes.  I  do not see marked schistocytes, although there are occasional schistocytes  seen.  There is some eosinophilia, there are nucleated red cells, and there  is a marked polychromasia.  Some lymphocytes have hairy projections but this  is not a very good slide and this is not a reliable observation.   PLAN:  To begin treatment with steroids.  Unfortunately, this is going to  make it difficult to interpret flow cytometry and we may hold off on that  for the time being.  The patient also will be vaccinated with a possible  view to splenectomy if she does not have a good response to the steroids.  All of this was discussed with the patient in detail today and she has a  good understanding of the possible side effects, toxicities, and  complications associated with steroids.                                               Virgie Dad. Magrinat, M.D.    Lenard Galloway  D:  08/02/2002  T:  08/02/2002  Job:  [id number]   cc:   Biagio Borg, M.D. [facility]

## 2010-07-22 NOTE — Group Therapy Note (Signed)
[name], [name] NO.:  [id number]   MEDICAL RECORD NO.:  [id number]          PATIENT TYPE:  WOC   LOCATION:  [facility]                   FACILITY:  [facility]   PHYSICIAN:  Willey Blade, MD DATE OF BIRTH:  [birth year]   DATE OF SERVICE:  07/14/2005                                    CLINIC NOTE   REASON FOR CONSULTATION:  History of vaginal bulging and genuine urinary  stress incontinence.   HISTORY OF PRESENT ILLNESS:  This patient is a 75 year old gravida 2, para 2-  0-0-2 Caucasian female referred by Lorriane Shire, M.D., who presents with a  longstanding history over the past two to three years of increasing symptoms  related to genuine urinary stress incontinence and a four-month history of  vaginal bulging causing irritation.  The patient is active and has also had  to splint vaginally to have a bowel movement.  Patient states that she has  had some degree of fecal incontinence, although she is able to control soft  and hard stool as well as gas from time to time.  The patient had a total  abdominal hysterectomy many years ago and it is unclear whether her ovaries  were left in.  The patient states that the bulging worsens toward the end of  the day but persists as long as she is in an erect position.   OBSTETRIC/GYNECOLOGIC HISTORY:  The patient has had two normal spontaneous  vaginal deliveries and a total abdominal hysterectomy because of abnormal  uterine bleeding and fibroids.   ALLERGIES:  NO KNOWN DRUG ALLERGIES OR ALLERGIES TO IODINE OR LATEX.   CURRENT MEDICATIONS:  1.  Metoprolol 25 mg one twice a day.  2.  Estradiol 1 mg daily.  3.  Folic acid 1 mg daily.  4.  Baby aspirin 81 mg one daily.   PAST SURGICAL HISTORY:  Patient has had a splenectomy.   CURRENT MEDICATIONS:  1.  COPD.  2.  History of paroxysmal atrial fibrillation.  3.  GERD.  4.  Autoimmune hemolytic anemia.  5.  Anxiety.  6.  Glucose intolerance.  7.  Chronic  vertigo.   SOCIAL HISTORY:  Patient denies alcohol, drug or tobacco use.   FAMILY HISTORY:  Coronary artery heart disease in first degree relatives  including mother and father.   REVIEW OF SYSTEMS:  A 10-point comprehensive review of systems is within  normal limits.   PHYSICAL EXAMINATION:  VITAL SIGNS:  Blood pressure 137/61, weight 122.2  pounds, height 5 feet 5 inches, temperature 96.6.  ABDOMEN:  Soft without gross hepatosplenomegaly.  Well-healed abdominal  incision site.  PELVIC:  External genitalia, vulva and vagina demonstrates a third degree  rectocele with an enterocele present.  Second degree cystocele and +1 apical  prolapse.   Urinalysis is normal.  Residual urine volume is 10 mL.  Rectal tone is +1,  Rectovaginal examination confirmatory for above findings.  Both adnexa  palpable and found to be normal.  Vagina appears to be well estrogenized.   IMPRESSION:  Third degree rectocele with enterocele, second degree cystocele  with a +1  apical prolapse.  Genuine urinary stress incontinence and slight  fecal incontinence.   PLAN:  The patient has symptomatology consistent with genuine urinary stress  incontinence.  She loses urine with Valsalva maneuvers including coughing,  straining and lifting.  She occasionally loses urine at rest but this is not  of consequence to the patient.  It is more difficult to assess the patient's  fecal incontinence history, although it appears that this has worsened over  time but probably has started since her last deliver years ago.  Patient is  somewhat reserved and conservative in her answers to personal questions  related to defecation and urination, therefore, I presume that this has been  an embarrassing problem for many years.  I discussed with the patient  various options.  She is not a candidate for pessary due to her active  lifestyle.  In the end, we discussed, and the patient agreed to, an anterior  posterior repair with a  possible posterior graft, PVT with cystoscopy,  possible operative laparoscopy, bilateral salpingo-oophorectomy.  An  anoplasty will be performed to bring together the external sphincter muscle  to see if this will help with her tone.  I explained to the patient the  nature of said procedures but also added that nerve damage from her past may  preclude satisfactory correction of her fecal incontinence.  All questions  answered to the satisfaction of said patient and her sister.   The patient has been referred back to Dr. Stanford Breed, who is her cardiologist,  for clearance for surgery on Jul 17, 2005.           ______________________________  Willey Blade, MD     SHB/MEDQ  D:  07/17/2005  T:  07/18/2005  Job:  [id number]   cc:   Lorriane Shire  Fax: [phone]

## 2010-07-22 NOTE — Discharge Summary (Signed)
[name], [place]                           ACCOUNT NO.:  [id number]   MEDICAL RECORD NO.:  [id number]                   PATIENT TYPE:  INP   LOCATION:  [id number]                                 FACILITY:  [facility]   PHYSICIAN:  Biagio Borg, M.D. [facility]             DATE OF BIRTH:  November 24, 1935   DATE OF ADMISSION:  08/01/2002  DATE OF DISCHARGE:  08/04/2002                                 DISCHARGE SUMMARY   DISCHARGE DIAGNOSES:  1. Autoimmune anemia.  2. Chronic obstructive pulmonary disease.  3. Hypertonic bladder.   CONSULTS:  Dr. Jana Hakim, hematology/oncology.   PROCEDURES:  None.   HISTORY AND PHYSICAL:  See that dictated on date of admission, 08/01/02.   HOSPITAL COURSE:  [name] is a 75 year old white female who presented with  severe fatigue and dyspnea otherwise unexplained.  MCV was 114, hemoglobin  of 5.9.  She was admitted for transfusion and further evaluation.  She  received two units of packed red blood cells without difficulty and  tolerated this well with improvement in her dyspnea and overall fatigue,  though not completely resolved.  Her hemoglobin was 8.9 post transfusion and  somewhat drifting down towards discharge at 8.3.  She was found to have DAT,  IgG positive, borderline B12 low at 197, folate and ferritin, iron levels  normal, reticulocyte marked increase at 22.5%.  Homocystine 14.91,  haptoglobin less than 6, LDL initial 1437, followup 1333.  She was begun on  IV steroids, she is doing relatively well with relatively stable  hemoglobins.  She was changed to p.o. prednisone.  She was ambulatory and  doing well, good appetite, no other new problems and she was felt to have  achieved maximum benefit from hospitalization as her hemoglobins were  remaining relatively stable at time of discharge.  She did have some  constipation which was improved with Colace.  She was noted to have  borderline B12, low, and was given two doses of vitamin B12 1000 mg IM.  She  was also given a Pneumovax x1.  There are several blood tests pending,  including an alpha intrinsic factor and methylmalonic acid levels.  There  was some discussion regarding the need for splenectomy and she has not  decided about this at this point, but is willing to follow up and discuss it  further in the future.  She was also given albuterol meter dose inhaler for  COPD which may have helped some.  She was interested in trying to quit  smoking, she was given some indication and direction regarding this,  including over-the-counter nicotine replacements.   At the time of discharge, she was on prednisone 60 mg and she was felt  relatively stable and able to be discharged.   DISPOSITION:  The patient was discharged to home in good condition with no  dietary or activity restrictions.   DISCHARGE MEDICATIONS:  1. Prednisone 60  mg p.o. daily.  2. Albuterol meter dose inhaler p.r.n.  3. Folate 1 mg daily.  4. Prilosec 20 mg daily, over-the-counter.  5. Colace 100 mg b.i.d. p.r.n., over-the-counter.   She will follow up in my office, Dr. Jenny Reichmann, tomorrow at 9 a.m. with a CBC,  consider outpatient transfusion for a hemoglobin less than 8, she is doing  otherwise relatively well.  She will need outpatient followup with Dr.  Jana Hakim in approximately one week for further consideration and treatment  regarding optimal therapy of her autoimmune anemia.                                               Biagio Borg, M.D. [facility]    JWJ/MEDQ  D:  08/04/2002  T:  08/04/2002  Job:  [id number]   cc:   Virgie Dad. Magrinat, M.D.  [address] [place] [address]  Fax: [phone]

## 2010-07-22 NOTE — Discharge Summary (Signed)
[name], LENSER NO.:  [id number]   MEDICAL RECORD NO.:  [id number]          PATIENT TYPE:  INP   LOCATION:  6706                         FACILITY:  [facility]   PHYSICIAN:  Gwendolyn Grant, M.D. LHCDATE OF BIRTH:  [birth year]   DATE OF ADMISSION:  01/31/2005  DATE OF DISCHARGE:  02/02/2005                                 DISCHARGE SUMMARY   DISCHARGE DIAGNOSES:  1.  Abdominal pain/nausea/vomiting secondary to ulcer and gastritis, status      post esophagogastroduodenoscopy.  2.  Leukocytosis with fever.  3.  Dyspnea without hypoxia, oxygen saturation stable on room air.  4.  Paroxysmal atrial fibrillation.   HISTORY OF PRESENT ILLNESS:  The patient is a 75 year old female admitted on  January 30, 2005 with complaints of abdominal pain, nausea and vomiting,  which have become worse over the past several weeks prior to admission.  She  has lost several pounds and notes that the pain is mainly in the upper  abdomen, mostly right-sided.  The patient was admitted for further  evaluation.   PAST MEDICAL HISTORY:  1.  COPD.  2.  History of paroxysmal atrial fibrillation.  3.  GERD.  4.  Autoimmune hemolytic anemia.  5.  Anxiety.  6.  Glucose intolerance.  7.  Chronic vertigo.  8.  Degenerative joint disease.   COURSE OF HOSPITALIZATION:  PROBLEM #1 - ABDOMINAL PAIN/NAUSEA AND VOMITING  SECONDARY TO ULCER/GASTRITIS:  The patient was admitted and a GI consult was  obtained.  The patient was seen by Dr. Lucio Edward.  She underwent an EGD  during this hospitalization which noted a deep antral ulcer which also may  be the cause of the patient's leukocytosis.  It was recommended that the  patient have a PPI twice daily for 2 weeks and then once daily with  outpatient followup with Dr. Sharlett Iles in 4 weeks.   PROBLEM #2 - DYSPNEA WITHOUT HYPOXIA:  A CT angiogram was performed which  was negative for PE.  The patient's O2 SATS remained stable on room air.   PROBLEM #3 - PAROXYSMAL ATRIAL FIBRILLATION:  The patient was maintained on  beta blocker; however, aspirin was stopped secondary to ulcer.   LABORATORIES AT DISCHARGE:  Hemoglobin 13.6, hematocrit 40.1.   MEDICATIONS AT DISCHARGE:  1.  Protonix 40 mg twice daily for 2 weeks, then once daily.  2.  Metoprolol 25 mg p.o. twice daily.  3.  Folic acid 1 mg p.o. daily.  4.  Estradiol 1 mg p.o. daily.  5.  Phenergan 25 mg p.o. q.6 h. p.r.n.  6.  Percocet 5/325 mg one to two tabs p.o. q.6 h. p.r.n.   FOLLOWUP:  The patient was instructed to follow up with Dr. Cathlean Cower on  February 13, 2005 at 1:30 p.m.  She was also instructed to follow up with  Dr. Sharlett Iles in 4 weeks and call for an appointment.  She was instructed to  call Dr. Jenny Reichmann should she develop nausea, vomiting or fever over 101.  She is  instructed not to take [facility] powder, aspirin, ibuprofen or nonsteroidal  products.      Melissa S. Inda Castle, NP      Gwendolyn Grant, M.D. [facility]  Electronically Signed    MSO/MEDQ  D:  03/27/2005  T:  03/28/2005  Job:  [id number]   cc:   Biagio Borg, M.D. [facility]  [address]  [place] [address]

## 2010-07-22 NOTE — H&P (Signed)
[name], [name]                           ACCOUNT NO.:  [id number]   MEDICAL RECORD NO.:  [id number]                   PATIENT TYPE:  INP   LOCATION:  4714                                 FACILITY:  [place]   PHYSICIAN:  Kirk Ruths, M.D.                DATE OF BIRTH:  04/09/1935   DATE OF ADMISSION:  08/14/2002  DATE OF DISCHARGE:                                HISTORY & PHYSICAL   HISTORY OF PRESENT ILLNESS:  [name] is a pleasant 75 year old female with  a past medical history of chronic obstructive pulmonary disease, as well as  autoimmune hemolytic anemia whom I was asked to evaluate for atrial  fibrillation.  The patient was recently admitted with a severe hemolytic  anemia.  She had a marked reticulocytosis and a positive DOT.  There were  some spherocytes.  The patient was felt to need steroid therapy.  If she  does not have a good response to steroids, it was felt that she may require  splenectomy.  She felt much better after transfusion.  However, this morning  she developed sudden onset of palpitations.  They were associated with  presyncope as well as dyspnea, but there was no chest pain.  Of note, she  does have dyspnea on exertion chronically, but there is no exertional chest  pain.  She also typically does not have palpitations.  She came to the  emergency room and was found to be in atrial fibrillation with a rapid  ventricular response.  She received a Cardizem bolus and her heart rate  decreased and subsequently converted to normal sinus rhythm.  We were asked  to further evaluate.   HOME MEDICATIONS:  1. Prednisone taper.  2. Albuterol meter dosed inhaler.  3. Folate.  4. Prilosec.  5. Colace.   ALLERGIES:  No known drug allergies.   SOCIAL HISTORY:  She does smoke, but she does not consume alcohol.   FAMILY HISTORY:  Significant for coronary artery disease in her father.   PAST MEDICAL HISTORY:  There is no diabetes mellitus, hypertension,  hyperlipidemia.  She does have chronic obstructive pulmonary disease.  She  has had a recent diagnosis of autoimmune hemolytic anemia.  She has had  prior bladder attack.  She has had cataract surgery, appendectomy, and  partial hysterectomy.   REVIEW OF SYSTEMS:  She denies any headaches, or fevers or chills.  There is  no productive cough or hemoptysis.  There is no dysphagia, odynophagia,  melena, hematochezia.  She did have a bout of hematochezia on her toilet  paper approximately three days ago.  She did discuss this with Dr. Jenny Reichmann.  There is no orthopnea, PND, or pedal edema.  She does have chronic dyspnea  on exertion.  The remaining systems are negative.   PHYSICAL EXAMINATION:  VITAL SIGNS:  Blood pressure 101/52, pulse 110.  She  is afebrile.  GENERAL:  She is well-developed and frail.  She is in no acute distress.  SKIN:  Warm and dry.  There is no peripheral clubbing.  HEENT:  Unremarkable with normal eyelids.  NECK:  Supple, normal carotid upstrokes, no bruits noted.  There is no  jugular venous distention, no thyromegaly noted.  CHEST:  Mildly diminished breath sounds throughout, but it is otherwise  clear to auscultation, normal expansion.  CARDIOVASCULAR:  Regular rate and rhythm, normal S1 and S2.  There are no  murmurs, rubs, or gallops noted.  ABDOMEN:  Nontender, nondistended, positive bowel sounds, no  hepatosplenomegaly, no masses appreciated.  There is no abdominal bruit.  She has 2+ femoral pulses bilaterally and no bruits.  EXTREMITIES:  No edema, and I can palpate no cords.  She has 2+ dorsalis  pedis pulses bilaterally.  NEUROLOGIC:  Grossly intact.   LABORATORY DATA:  Her electrocardiogram shows atrial fibrillation with a  rapid ventricular response subsequently converting to sinus rhythm.  Her  chest x-ray shows mild interstitial disease.  There is also chronic  obstructive pulmonary disease.  Her hemoglobin is 10.1, hematocrit 28.1.  Her white blood cell  count is 28.  Her BUN and creatinine are 17 and 0.6.  Her bilirubin is 2.8, ALT of 31, alkaline phosphatase 54.  Her initial  enzymes are negative.   DIAGNOSES:  1. Atrial fibrillation, now converted to normal sinus rhythm.  2. Recently diagnosed autoimmune hemolytic anemia.  3. Chronic obstructive pulmonary disease.  4. Tobacco abuse.   PLAN:  [name] is admitted with atrial fibrillation.  She has converted to  normal sinus rhythm, but I think we should rule out myocardial infarction  with serial enzymes.  If they are negative, she will most likely require an  outpatient Cardiolite.  I doubt ischemia at present.  We will also add p.o.  Cardizem for rate control if atrial fibrillation recurs.  She has no  significant embolic risk factors, including no hypertension, diabetes  mellitus, or a history of cerebrovascular accident.  There is also no  history of decreased left ventricular function.  I am also concerned about  her history of hemolytic anemia, and we will therefore not add Coumadin.  We  will treat with aspirin instead.  We will also plan to check an  echocardiogram to quantify her left ventricular hypertrophy as well as her  TSH.  Finally, I did discuss extensively with her the need to discontinue  her tobacco use.                                                Kirk Ruths, M.D.    BC/MEDQ  D:  08/14/2002  T:  08/14/2002  Job:  [id number]

## 2010-07-22 NOTE — Op Note (Signed)
[name], RICHBERG                           ACCOUNT NO.:  [id number]   MEDICAL RECORD NO.:  [id number]                   PATIENT TYPE:  INP   LOCATION:  [id number]                                 [facility]ACILITY[facility]   PHYSICIAN:  Odis Hollingshead, M.D.            DATE OF BIRTH:  06-03-1935   DATE OF PROCEDURE:  10/21/2002  DATE OF DISCHARGE:                                 OPERATIVE REPORT   PREOPERATIVE DIAGNOSIS:  Autoimmune hemolytic anemia.   POSTOPERATIVE DIAGNOSIS:  Autoimmune hemolytic anemia.   OPERATION/PROCEDURE:  Laparoscopic cholecystectomy splenectomy.   SURGEON:  Odis Hollingshead, M.D.   ASSISTANT:  Darene Lamer. Hoxworth, M.D.   ANESTHESIA:  General.   INDICATIONS:  Mrs. Carlow is a 75 year old woman with autoimmune hemolytic  anemia.  She is on prednisone for some response but was having a difficult  time tolerating this.  I saw her and we went over splenectomy specifically  laparoscopic splenectomy, the procedure, the risks, and success rate were  discussed with her.  She realizes it is not 100% success rate but the  majority of people do have improvement with splenectomy.  She seemed to  understand this and was agreeable to proceeding.   DESCRIPTION OF PROCEDURE:  She is seen in the holding area, brought to the  operating room, placed supine on the operating table and general anesthesia  was administered.  Her body was then tilted right lateral decubitus, 45  degrees and held there with a bean bag with appropriate padding placed in  the axillary region, between the legs and on the arms.  The abdomen, flank  and back were sterilely prepped and draped.  The table was rotated until she  was nearly supine and a supraumbilical incision was made incising the skin  and subcutaneous tissue sharply until the fascia was identified and a small  incision was made in it.  Small incision was made in the peritoneum and the  peritoneal cavity was entered.  A pursestring suture of  0 Vicryl was placed  around the fascial edges.  A Hasson trocar was introduced into the  peritoneal cavity and the pneumoperitoneum was created by instillation of  CO2 gas.   Next, a 30-degree laparoscope was introduced.  Under direct vision a 5 mm  subxiphoid trocar was placed.  A 10 mm trocar was placed approximately 2-3  cm inferior to the costal margin, midclavicular line and one placed 2 cm  inferior to the costal margin anterior axillary line.  The spleen was noted  to be of moderate to large size.  Began initially by putting on her reverse  Trendelenburg position and then retracting the spleen laterally, exposing  the short gastric vessels which were divided with Harmonic scalpel.  I then  approached the inferior lateral aspect of the spleen and using Harmonic  scalpel, I divided the splenocolic, splenorenal and splenophrenic  attachments.  I continue to  rotate the table toward me so I was able to  carry this dissection well posterior to the spleen.  All this was done using  the Harmonic scalpel.   Next, I approached the spleen from the medial aspect and divided the  remaining short gastric vessels. I then divided some peritoneum over the  hilar vessels.  I was subsequently able to protract the spleen anteriorly  toward the anterior abdominal wall.  I identified the tail of the pancreas  which was fairly closely adherent to the hilum of the spleen.  I began  taking down splenic vessels sequentially with the vascular staple close to  the spleen.  I got to a point where I could gently separate the tail of the  pancreas from the remaining hilar vessels and then using the 60 mm vascular  stapler, I was able to divide the remaining vessels on the spleen.  I did  not see an obvious injury to the tail of the pancreas although the tail was  fairly prominent.   Following this, I inserted a large Endopouch bag and was able to manipulate  the spleen into it.  I then closed the bag and was  able to bring it out  through the supraumbilical incision.  Using the finger, I morcellated the  spleen and slowly picked the pieces out until I could remove the bag with  the remaining spleen in it.  This was all sent to pathology.   Next, the trocar was replaced and the abdominal cavity was reinsufflated.  I  irrigated the splenic bed and saw no significant bleeding.  I did place a  piece of Surgicel on the raw splenic bed surface.  I had examined the lesser  sac area and no accessory spleens were noted.   When hemostasis was adequate and as much fluid as possible was evacuated  from the intra-abdominal cavity,  I then removed all the trocars and  released the pneumoperitoneum.  The supraumbilical fascial defect was closed  with interrupted 0 Vicryl sutures as was the fascial defect in the left  upper quadrant midclavicular line.  Before complete removal of instruments,  a drain was placed through the anterior axillary line incision near the tail  of the pancreas and anchored to the skin with a 4-0 nylon suture.  It was  placed to closed suction.  The skin incisions were then closed with staples.  Sterile dressings were applied.   She tolerated the procedure well without any apparent complications and was  taken to the recovery room in satisfactory condition.  Postoperatively we  will start her a steroid taper.                                               Odis Hollingshead, M.D.    Katina Degree  D:  10/21/2002  T:  10/21/2002  Job:  [id number]   cc:   Virgie Dad. Magrinat, M.D.  [address]. [place] [address]  Fax: [phone]   Biagio Borg, M.D. [facility]

## 2010-07-22 NOTE — Discharge Summary (Signed)
   NAMECRISTYN, [name]                           ACCOUNT NO.:  [id number]   MEDICAL RECORD NO.:  [id number]                   PATIENT TYPE:  INP   LOCATION:  [id number]                                 FACILITY:  [facility]   PHYSICIAN:  Odis Hollingshead, M.D.            DATE OF BIRTH:  Feb 25, 1936   DATE OF ADMISSION:  10/21/2002  DATE OF DISCHARGE:  10/26/2002                                 DISCHARGE SUMMARY   PRINCIPAL DISCHARGE DIAGNOSIS:  Autoimmune hemolytic anemia.   SECONDARY DIAGNOSES:  1. Atrial fibrillation.  2. Chronic obstructive pulmonary disease.   PROCEDURE:  Laparoscopic splenectomy.   REASON FOR ADMISSION:  [name] is a 75 year old female with autoimmune  hemolytic anemia requiring very large doses of prednisone and she is having  side effects from those.  We have discussed a splenectomy for treatment of  autoimmune hemolytic anemia and she was admitted for that.   HOSPITAL COURSE:  She underwent a laparoscopic splenectomy, which she  tolerated well, and had a leg drain left in.  She was placed on a steroid  taper.  Platelets were normal.  Her abdominal discomfort began to improve as  did the postoperative nausea.  She did start having intermittent bursts of  atrial fibrillation by her second postoperative day and I asked cardiology  to see her.  They placed on some Lopressor and felt that she could resume  aspirin when it was okay.  She began passing gas and tolerating a diet.  The  Lopressor was increased and eventually she became in sinus rhythm,  tolerating a diet.  The incisions were clean and intact, and she was  prepared for discharged.   DISPOSITION:  Discharged to home October 26, 2002 in satisfactory condition.   DISCHARGE MEDICATIONS:  1. Prednisone 20 mg a day.  2. Medications dictated to her by the cardiologist.  3. Tylox for pain.    DISCHARGE ACTIVITIES:  She was given activity restrictions.   FOLLOW UP:  She will see me back on the following  Wednesday for a check up.                                               Odis Hollingshead, M.D.    Katina Degree  D:  11/18/2002  T:  11/18/2002  Job:  [id number]   cc:   Virgie Dad. Magrinat, M.D.  [place]. [place] [address]  Fax: [phone]   Kirk Ruths, M.D.   Biagio Borg, M.D. [facility]

## 2010-07-22 NOTE — H&P (Signed)
NAMEGWENETTE, [name]                           ACCOUNT NO.:  [id number]   MEDICAL RECORD NO.:  [id number]                    PATIENT TYPE:   LOCATION:                                       FACILITY:   PHYSICIAN:  Biagio Borg, M.D. [facility]             DATE OF BIRTH:   DATE OF ADMISSION:  DATE OF DISCHARGE:                                HISTORY & PHYSICAL   CHIEF COMPLAINT:  Fatigue and low hemoglobin.   HISTORY OF PRESENT ILLNESS:  [name] is a 75 year old white female, I am  seeing for the second day in a row after we found her hemoglobin 5.9 by  office labs yesterday.  She complains of ongoing severe fatigue and dyspnea,  otherwise unexplained.  The MCV is 114.  There is no obvious blood loss.  She is now admitted for transfusion and further evaluation.   PAST MEDICAL HISTORY:  1. COPD.  2. Anemia.  3. Hypertonic bladder.   PAST SURGICAL HISTORY:  1. Status post breast biopsy, negative, years ago.  2. Status post appendectomy.  3. Status post T&A.  4. Status post TAH.   ALLERGIES:  None.   MEDICATIONS:  None.   SOCIAL HISTORY:  Tobacco less than 1 pack-per-day.  Alcohol none.  Works as  a Training and development officer for the [employer]   FAMILY HISTORY:  Breast cancer, heart disease, and diabetes.   REVIEW OF SYSTEMS:  Otherwise noncontributory.   PHYSICAL EXAMINATION:  GENERAL:  [name] is a 78 year old white female.  VITAL SIGNS:  Blood pressure 110/60, respirations 20, pulse 64, temperature  96.8, weight 115.  She shows marked pallor.  ENT:  Sclerae clear, TM's clear, pharynx benign.  NECK:  Without lymphadenopathy, JVD, thyromegaly.  CHEST:  No rales or wheezes.  CARDIAC:  Regular rate and rhythm, no murmur.  ABDOMEN:  Soft, nontender, positive bowel sounds, no organomegaly, no  masses.  EXTREMITIES:  No edema.   LABORATORY DATA:  Office labs from May 27:  White blood cell count 13.0,  hemoglobin 5.9, MCV 114. Platelet count 146.  Differential relatively normal  with some  macrocytosis and a few nucleated red blood cells on the peripheral  exam.  Glucose 89.  BUN and creatinine 17 and 0.7.  Electrolytes within  normal limits.  LFTs within normal limits except for SGOT slightly high at  88.  Lipid with total cholesterol 124.  TSH normal.  UA essentially  negative.   ASSESSMENT AND PLAN:  Anemia, severe with fatigue:  She is admitted for a  type and cross and two units of packed red cells to be transfused.  We will  guaiac stools, but I have __________ for iron deficiency.  We will track  reticulocyte counts, CBC, B12, folate, iron, and haptoglobin.  May consider  gastrointestinal and/or hematology consult.  If she tolerates the  transfusions well, she may actually be discharged tomorrow  with  consideration for follow-up hematology.                                               Biagio Borg, M.D. [facility]    JWJ/MEDQ  D:  08/01/2002  T:  08/01/2002  Job:  [id number]

## 2010-07-22 NOTE — Op Note (Signed)
NAMEKENAYA, [name]                 ACCOUNT NO.:  [id number]   MEDICAL RECORD NO.:  [id number]          PATIENT TYPE:  AMB   LOCATION:  [place]                           FACILITY:  [facility]   PHYSICIAN:  Willey Blade, MD  DATE OF BIRTH:  01/03/1936   DATE OF PROCEDURE:  08/29/2005  DATE OF DISCHARGE:                                 OPERATIVE REPORT   PREOPERATIVE DIAGNOSES:  1.  Third degree rectocele.  2.  Second-degree cystocele.  3.  Genuine urinary stress incontinence.  4.  Enterocele.  5.  Partial vaginal vault prolapse.   POSTOPERATIVE DIAGNOSES:  1.  Third degree rectocele.  2.  Second-degree cystocele.  3.  Genuine urinary stress incontinence.  4.  Enterocele.  5.  Partial vaginal vault prolapse.   PROCEDURES:  1.  Anterior-posterior enterocele repair.  2.  Tension-free vaginal tape procedure.  3.  Cystoscopy.  4.  Uterosacral ligament vaginal vault suspension.   SURGEON:  Willey Blade, M.D.   ASSISTANTAbbe Amsterdam D. Rose, M.D.   ESTIMATED BLOOD LOSS:  Minimal.   COMPLICATIONS:  None immediate.   ANESTHESIA:  Spinal.   SPECIMEN:  Perineal skin to pathology.   OPERATIVE FINDINGS:  The patient demonstrated the aforementioned vaginal  prolapse compartment defects.  Cystoscopy performed during the procedure  demonstrated no evidence of violation of the bladder, which will be  described below.   OPERATIVE PROCEDURE:  The patient prepped and draped in the usual fashion  and placed in lithotomy position.  Betadine solution used for antiseptic.  The patient was catheterized prior to the procedure.  After adequate spinal  analgesia, the posterior fourchette was opened and an inverted triangle made  over the perineum.  Due to the thinning of said tissue and concern regarding  lichen sclerosis atrophicus, said tissue was sent for pathologic evaluation.  The posterior vaginal epithelium was incised vertically in the midline up to  the last centimeter of the posterior  vaginal wall.  The peritoneal sac was  opened.  Bowel was noted.  At this point with a finger in the rectum, the  uterosacral ligaments were carefully identified at their proximal portions  with the aid of an Allis clamp.  A figure-of-eight 0 Prolene was placed  through said ligaments on either side sufficiently high to provide adequate  support for the vault.  At this point a cystoscopy was performed with indigo  carmine to assure that after tensioning said sutures that there was no  indication for obstruction of the ureters.  After satisfying said  requirement of good spillage of dye on either side, said Prolene was then  affixed to the uppermost portion of the posterior aspect of the vaginal  epithelium.  At this point the rectovaginal septum was dissected off the  vaginal epithelium posteriorly and approximated with a 0 Monocryl  interrupted suture.  The rectovaginal septum that was adherent to the to the  perineal body was also affixed cephalad to the posterior aspect of the apex  of the vaginal vault.  The enterocele sac previously which had been entered  was appropriately trimmed and tied off and closed with 3-0 Vicryl suture.  Attention was then directed to the anterior compartment.  A midline incision  was made in the anterior vaginal wall midline and the pubovesical cervical  fascia dissected off said sidewall to the space of Retzius.  Using a bottom-  up technique with the Advantage system, the TVT was appropriately placed  through the space of Retzius and emanating 1-2 cm on either side of the  symphysis pubis.   Reevaluation of the bladder by cystoscopy revealed no injury of the bladder,  which included inspection of the dome, lateral walls, posterior bladder wall  and urethra as well as trigone.  At this point appropriate tensioning with  250 mL of normal saline was conducted and the plastic sheaths removed from  the TVT tape.  Copious irrigation with lactated Ringer's  followed.  The tape  was well applied and the anterior vaginal wall closed with 2-0 Monocryl  running suture, 0 Monocryl running for the posterior vaginal wall closure.  At the end of procedure no active bleeding noted.  The patient tolerated the  procedure well, returned to the post anesthesia recovery room in excellent  condition.  Rectal exam performed and no injury to rectal mucosa noted.      Willey Blade, MD  Electronically Signed     SHB/MEDQ  D:  08/29/2005  T:  08/29/2005  Job:  [id number]

## 2010-07-22 NOTE — Assessment & Plan Note (Signed)
[place]                             PRIMARY CARE OFFICE NOTE   NAME:[name], [name]                        MRN:          [id number]  DATE:10/24/2005                            DOB:          11-12-1935    The patient is a 75 year old female with multiple medical problems, who  presents to establish primary with me, address her chronic conditions and  establish the date when she will be able to go back to work.  She has been  feeling tired and making a slow recovery after her critical illness.   PAST MEDICAL HISTORY:  1. Status post respiratory failure, ventilator-dependent, bilateral      pneumonia and septic shock in July 2007.  2. Profound deconditioning, failure to thrive.  3. Paroxysmal atrial fibrillation, rate-controlled.  4. COPD.  5. Diarrhea, C. difficile negative.  6. Anemia of chronic disease.  7. Hypokalemia.  8. Hyperglycemia on steroids.  9. Anxiety and insomnia.   CURRENT MEDICATIONS:  1. Seroquel 25 mg at h.s.  2. Folic acid 1 mg daily.  3. Digoxin 0.25 mg daily.  4. Metoprolol 25 mg three tablets daily.  5. Aspirin 81 mg daily.   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  Stopped  due to her acute illness in June 2007.  Quit smoking in June.  No alcohol.  Lives alone.  Her sister has been checking on her.   Family history discussed with the patient.   ALLERGIES:  None.   REVIEW OF SYSTEMS:  Gained 5 pounds.  Occasional chest pain with coughing.  No syncope.  Continues to be tired.  No leg swelling or calf pain.  No  palpitations.  The rest is as above or negative.   PHYSICAL EXAMINATION:  VITAL SIGNS:  Blood pressure 108/63, pulse 57,  temperature 96.4, weight 117 pounds.  GENERAL:  She is in no acute distress, thin, no pallor.  HEENT:  Not dry, no thrush.  LUNGS:  With equal breath sounds, no wheeze or rales.  CARDIAC:  S1, S2, irregular rhythm and rate, bradycardic.  ABDOMEN:  Soft,  nontender, no organomegaly is felt.  EXTREMITIES:  Lower extremities without edema.  NEUROLOGIC:  She is alert and appropriate, denies being depressed.   Labs, chart, discharge summary, operative note and other reviewed.   ASSESSMENT AND PLAN:  1. Failure to thrive, status post prolonged hospitalization in June 2007,      status post ventilator-dependent respiratory failure, septic shock,      bilateral pneumonia.  Overall making good recovery.  Will stay off work      for another few weeks.  I will see her back in the middle of September.  2. Paroxysmal atrial fibrillation.  Reduce metoprolol to 25 mg two a day      due to fatigue.  Will continue with digoxin, obtain a digoxin level,      obtain lab work.  3. Chronic obstructive pulmonary disease.  She has stopped smoking.  4. Anticoagulation.  She is not a candidate for Coumadin.  Will continue      with baby aspirin.  5. Status post bladder track.  Recovering.  6. Fatigue.  Discontinue folic acid.  Asked her to take vitamin B complex      and vitamin D.  Will also obtain a vitamin D level.  7. Diarrhea.  Will prescribe FloraQ.  Obtain stool for Clostridium      difficile.  8. Anemia of chronic disease.  Obtain iron and TIBC with iron.  9. Insomnia.  She would like to get off Seroquel.  Will taper off.      Tylenol P.M. p.r.n.  10.Disability papers filled out.                                   Walker Kehr, MD   AP/MedQ  DD:  10/24/2005  DT:  10/25/2005  Job #:  [id number]

## 2010-07-22 NOTE — Discharge Summary (Signed)
[name], [name] NO.:  [id number]   MEDICAL RECORD NO.:  [id number]          PATIENT TYPE:  INP   LOCATION:  6706                         FACILITY:  [facility]   PHYSICIAN:  Gwendolyn Grant, M.D. LHCDATE OF BIRTH:  14-Jul-1935   DATE OF ADMISSION:  09/03/2005  DATE OF DISCHARGE:  09/26/2005                                 DISCHARGE SUMMARY   DISCHARGE DIAGNOSES:  1.  Status post ventilator-dependent respiratory failure and shock,      secondary to bilateral pneumonia, resolved.  2.  Profound deconditioning and failure to thrive secondary to above, for      short-term skilled facility rehabilitation.  3.  Paroxysmal atrial fibrillation, not anticoagulation candidate.  Continue      digoxin and metoprolol.  Currently normal sinus.  4.  Chronic obstructive pulmonary disease with tobacco abuse prior to      admission, continue oxygen nebulizers as needed.  Steroid taper,      ongoing.  5.  Diarrhea, antibiotic, but Clostridium difficile negative.  Resolved with      discontinuation of antibiotics, Reglan, and Protonix.  6.  Anemia of chronic disease without signs or symptoms of acute blood loss.      Discharge hemoglobin 12.5.  7.  Hypokalemia secondary to gastrointestinal loss, resolved.  8.  Status post rectocele/cystocele repair, June 26th, prior to admission.      Dr. Burke Keels, [place].  9.  Hyperglycemia while on steroids, and continuous tube feeds, resolved      with tapering steroids and regular diet.  10. Anxiety with questionable history of depression.  Continue Seroquel.   DISCHARGE MEDICATIONS:  1.  Folic acid 1 mg daily.  2.  Aspirin 81 mg daily.  3.  Digoxin 0.25 mg daily.  4.  Seroquel 25 mg p.o. nightly.  5.  Protein supplement (Beneprotein 6 gm p.o. t.i.d. with Ensure pudding).  6.  Xopenex 0.63 mg neb with Atrovent 0.5 mg neb q.i.d. plus p.r.n.  7.  Prednisone 20 mg daily x3 days, then 10 mg daily x3 days, then 5 mg      daily x3 days, then  stop.  8.  Toprol XL 75 mg once p.o. daily.   DISPOSITION:  Patient is being discharged to [facility] for skilled  nursing facility.   CONDITION ON DISCHARGE:  Medically improved.  Motivated for participation in  rehab.  Generally improved in recovered condition.   HOSPITAL COURSE BY PROBLEM:  1.  Septic shock secondary to bilateral pneumonia:  The patient is a 75-year-      old woman who presented to the hospital on July 1 following previous      elective cystocele/rectocele repair at [facility].  Upon her      discharge home, patient says she has not been feeling well with      continuous diarrhea, weakness, and shortness of breath prompting her      evaluation at [facility].  Because of her shortness of breath, there was      concern for pneumonia and a chest x-ray and CT confirmed bilateral  pleural effusions, right greater than left, with bilateral opacities and      COPD changes.  She was treated with IV Rocephin and azithromycin but      also concern for possible C. diff versus complications related to her      recent GYN elective surgery.  The patient's white count initially      dropped from 18.5 to 15.8 on the above-listed IV antibiotic therapy, but      then it began to climb again.  She was seen by GI regarding possible C.      diff or other causes of diarrhea post surgical, who agreed with      continued empiric treatment with Flagyl and recommended input from GYN      regarding side effects of her surgery; however, the patient's breathing      continued to be of great difficulty with a rising white count and      worsening hypoxia.  She was broadened to Avelox without significant      improvement.  GYN and GI both felt diarrhea was not her primary symptom,      and nausea and vomiting were likely related to antibiotic therapy and      underlying pulmonary disease.  She was treated aggressively for her      pneumonia but continued to decline and transferred to  the ICU early the      morning of July 5th due to altered mental status, progressive hypoxia.      Her antibiotics were changed to vancomycin and Zosyn.  She was treated      per ICU sepsis protocol, also including Xigris and intubation for vent      support.  She remained on the ICU team management for support, including      tube feeds, IV antibiotics, and vent support through July 15th.  She was      transferred out of the ICU on July 17th, and primary care service      resumed care.  She was continued on a 14-day total course of her Zosyn      and vancomycin and has remained pulmonary stable since that time.  She      has had no recurrence of her hypertension or sepsis shock.  Her steroids      are being weaned.  She was being treated with stress-dose hydrocortisone      during her ICU stay.  Prednisone taper is as described, and she is      currently on no antibiotics.  On a side note, regarding the diarrhea,      the diarrhea persisted through most of the hospitalization, initially      thought to be due to antibiotics.  She was treated extensively with      Flagyl and was repeatedly C. diff negative x6.  Upon GI re-evaluation,      it was felt that antibiotic-related diarrhea exacerbated by tube feeds,      Reglan, and Protonix all were exacerbating this.  Upon discontinuing of      these above-listed medications, her diarrhea has resolved, and there has      been no recurrence of this.   1.  Paroxysmal atrial fibrillation:  This is also an issue during the      patient's hospitalization at [place], at which time she was seen      by Dr. Lattie Haw.  She was noted felt to be an anticoagulation candidate,  as it is likely the stress of surgery and in this case, severe sepsis      causing her slight exacerbation.  She has been normal sinus and rate      controlled during this hospitalization.  Will continue dig and     metoprolol as prior to admission.  Outpatient followup  with cardiology      as needed.   1.  Profound deconditioning:  Because of the patient's surgery and prolonged      hospitalization with vent-dependent sepsis, she is left in a weakened      state.  She has been by PT/OT, who feel that short-term skilled facility      is most appropriate, and plans are being made to transfer her there at      this time, and she has accepted a bed at [facility].  She is medically      stable and ready for reconditioning therapies.   PHYSICAL EXAMINATION AT DISCHARGE:  VITAL SIGNS:  Temperature 97.5, blood  pressure 147/70, pulse 62, respirations 20, satting 96% on room air.  GENERAL:  She is a thin, frail, hoarse woman who is in no acute distress.  HEENT:  Unremarkable.  LUNGS:  Clear to auscultation with slight decrease noted at the bases but  good effort.  No wheeze or crackle.  CARDIOVASCULAR:  Regular rate and rhythm.  ABDOMEN:  Soft, mildly distended but nontender.  Good bowel sounds.  EXTREMITIES:  No edema.  NEUROLOGIC:  She is awake, slightly cantankerous but in no acute distress.  Alert and oriented x4.  Good motor and sensory function peripherally.   LABORATORY DATA:  Most recent prior to discharge include July 23, white  count 14.1, hemoglobin 12.5, platelets 320.  Basic metabolic on July [id number]  normal with a sodium of 142, potassium 3.7, chloride 105, bicarb 31, BUN 6,  creatinine 0.5, glucose 97.      Gwendolyn Grant, M.D. [facility]  Electronically Signed     VL/MEDQ  D:  09/26/2005  T:  09/26/2005  Job:  [id number]

## 2010-07-22 NOTE — Assessment & Plan Note (Signed)
[place] OFFICE NOTE   NAME:[name], [name]                        MRN:          [id number]  DATE:03/16/2006                            DOB:          1935/07/08    [name] returns for followup today. She is a pleasant female that I  have seen in the past for atrial fibrillation that occurred in the  setting of hemolytic anemia and did require splenectomy. A previous  nuclear study in 2004 showed no significant ischemia and normal LV  function. She was recently admitted to the hospital following a third  degree rectocele and second degree cystocele repair. Subsequently, she  developed pneumonia and was intubated. She did have paroxysmal atrial  fibrillation at the time of those stressors. I hope she has not had  further problems with this by report. She continues to have significant  dyspnea. This is not as prevalent when she exerts herself as she states,  my mind is not on it. It does not worsen with laying flat, nor is  there is any increased pedal edema. There is no chest pain or syncope.  Note, she did have an echocardiogram when she was in the hospital in  July that showed normal LV function.   Her medications include:  1. Toprol 25 mg daily.  2. Seroquel.  3. Aspirin 81 mg daily.  4. Foradil.  5. Estradiol.  6. Vitamin B complex.  7. Vitamin D.   PHYSICAL EXAMINATION:  VITAL SIGNS: Blood pressure 130/80, pulse 70.  CHEST: Shows mildly diminished breath sounds throughout.  CARDIOVASCULAR: Reveals a regular rate and rhythm.  EXTREMITIES: No edema.   Electrocardiogram shows sinus rhythm with occasional PVCs. There are no  ST changes noted.   DIAGNOSIS:  1. History of atrial fibrillation in the setting of sepsis in the post-      operative setting.  2. Chronic obstructive pulmonary disease.  3. Tobacco abuse, now resolved.  4. Preserved LV function.   PLAN:  [name] continues to complain of  dyspnea of uncertain etiology.  I do not think it is cardiac as her LV function. It should be noted that  previous CAT scans and chest x-rays have revealed significant chronic  obstructive pulmonary disease. We will continue with her aspirin and  Lopressor. I do not think she needs Coumadin as all of her atrial  fibrillation occurred with the stress of surgery or sepsis. We will  check a BNP today. If it is normal then I would attribute  her dyspnea more likely to her lungs. She may need to see a  pulmonologist in the future, but I will leave this to Dr. Alain Marion. I  will see her back in approximately 12 months.     Denice Bors Stanford Breed, MD, [facility]  Electronically Signed    BSC/MedQ  DD: 03/16/2006  DT: 03/17/2006  Job #: [id number]

## 2010-07-22 NOTE — Consult Note (Signed)
NAMEJASLEN, PISTILLI NO.:  [id number]   MEDICAL RECORD NO.:  [id number]          PATIENT TYPE:  INP   LOCATION:  9308                          FACILITY:  [facility]   PHYSICIAN:  Jacqulyn Ducking, M.D. LHCDATE OF BIRTH:  1935/04/13   DATE OF CONSULTATION:  08/30/2005  DATE OF DISCHARGE:                                   CONSULTATION   PRIMARY CARDIOLOGIST:  Dr. Kirk Ruths   PRIMARY CARE MEDICAL DOCTOR:  Dr. Cathlean Cower   CHIEF COMPLAINT:  Atrial fibrillation.   HISTORY OF PRESENT ILLNESS:  Ms. Vigh is a 75 year old female with a history  of paroxysmal atrial fibrillation.  She was admitted yesterday for a bladder  tack.  Today, she had nausea and vomiting and was not given her normal dose  of beta blocker because of this.  She developed tachypalpitations and an EKG  revealed atrial fibrillation with rapid ventricular response.  Cardiology  was asked to evaluate her.   Ms. Pentland was initially evaluated in 2004 for atrial fibrillation with RVR.  It was paroxysmal then and she has not required cardioversion or  antiarrhythmics.  She has been followed periodically by Dr. Stanford Breed and had  a nonischemic Myoview as well as a normal echocardiogram.  Dr. Stanford Breed last  saw her on Jul 17, 2005, and felt that no further cardiac workup was  indicated preoperatively.   Ms. Hullum rarely has palpitations.  She says that when she gets them she feels  them.  Unless she feels tachypalpitations she is not ever aware of her heart  beating irregularly or skipping any beats.  She feels that the metoprolol  she takes twice a day controls her palpitations very well and she never  misses a dose.  Of note, she was given her home dose of metoprolol at 2:30  today and by 3:15 her heart rate was again regular and an EKG reveals that  she is back in sinus rhythm.   PAST MEDICAL HISTORY:  1.  Status post Myoview in 2004 showing soft tissue attenuation but no scar      or ischemia and an EF of  64.  2.  Status post echocardiogram in 2005 with trace AI and a normal EF.  3.  Autoimmune hemolytic anemia.  4.  Paroxysmal atrial fibrillation.  5.  COPD.  6.  Gastroesophageal reflux disease.  7.  History of peptic ulcer disease.  8.  Vertigo.  9.  Anxiety.  10. Family history of coronary artery disease.  11. Ongoing tobacco use.   SURGICAL HISTORY:  She is status post bladder tack June 26, as well as total  abdominal hysterectomy, splenectomy, EGD, and appendectomy.   ALLERGIES:  No known drug allergies.   MEDICATIONS:  Currently she is on Unasyn as well as metoprolol 25 mg b.i.d.  Vicodin, ibuprofen, Zofran, and Phenergan are p.r.n. medications.   SOCIAL HISTORY:  She lives in [place] alone and is a retired Training and development officer.  She  has approximately 40 pack-year history of tobacco use but no alcohol or  drugs.   FAMILY HISTORY:  Her mother  died in her 86s without any history of heart  disease.  Her father died at approximately age 26 of pneumonia but had a  history of multiple MIs.  She had one brother that had a heart attack and  died after a surgery from lung cancer.   REVIEW OF SYSTEMS:  She denies any recent fevers, chills, or sweats.  The  tachypalpitations are described above.  She denies any history of chest pain  or shortness of breath.  She has some chronic dyspnea on exertion and  possibly flat orthopnea but denies PND or edema.  She has chronic cough with  occasional wheezing but denies claudication symptoms, presyncope, or  syncope.  She had stress incontinence prior to admission and that was one of  the reasons for the surgery.  She has some problems with anxiety.  She had  nausea and vomiting today but denies any recent reflux symptoms,  hematemesis, or hemoptysis.  Review of systems is otherwise negative.   PHYSICAL EXAMINATION:  VITAL SIGNS:  Temperature is 97.2, blood pressure  140/59, pulse 64, respiratory rate 20, O2 saturation 98% on 2 L.  GENERAL:  She is a  well-developed elderly white female in no acute distress.  HEENT:  Her head is normocephalic and atraumatic with pupils equal, round,  and reactive to light and accommodation.  Extraocular movements intact.  Sclerae clear, nares without discharge.  NECK:  There is no lymphadenopathy, thyromegaly, bruit, or JVD noted.  CARDIOVASCULAR:  Her heart is regular in rate and rhythm with an S1 and S2  and no significant murmur, rub or gallop is noted.  LUNGS:  Her lungs have a few rales in the bases but no wheezing is noted.  SKIN:  No rashes or lesions are noted.  ABDOMEN:  Her lower abdomen is bandaged and this was not disturbed.  Her  belly is soft and she has active bowel sounds.  EXTREMITIES:  There is no cyanosis, clubbing or edema noted.  MUSCULOSKELETAL:  There is no joint deformity or effusion and no spine or  CVA tenderness.  NEUROLOGIC:  She is alert and oriented.  Cranial nerves II-XII grossly  intact.   Chest x-ray performed prior to surgery shows stable COPD and no acute  disease.   EKG is atrial fibrillation, rate 121, with no acute ischemic changes.  Repeat EKG performed just now shows sinus rhythm, rate 68, with sinus  arrhythmia and no acute ischemic changes.   LABORATORY VALUES:  Hemoglobin 10.8; hematocrit 32.2; wbc's 23,000;  platelets 225.  Sodium 140, potassium 3.8, chloride 105, CO2 30, BUN 9,  creatinine 0.6, glucose 83.   IMPRESSION:  1.  Status post bladder tack.  Under ordinary circumstances she would      possibly be a candidate for discharge in the morning.  This will be per      the surgical service.  2.  Paroxysmal atrial fibrillation:  She converted back to sinus rhythm      about 45 minutes after receiving her home dose of beta blocker.  We will      change it to b.i.d. instead of q.12h. to make sure she gets two doses      today.  Because of the dyspnea that she had with the palpitations, we     will cycle enzymes and recheck a chest x-ray.  If all is  okay then no      further inpatient evaluation is needed.  She can follow up with Dr.  Crenshaw once she recovers from the surgery and he can decide if further      evaluation including a repeat stress test or echocardiogram is      indicated.  3.  Anticoagulation:  Because her atrial fibrillation episode was so brief,      full anticoagulation with Lovenox or heparin is not needed.  She should      resume aspirin at 81 mg a day when cleared by surgery.  4.  Ms. Meggitt is otherwise stable and is being managed well by the surgical      service.  We will monitor her on telemetry for 24 hours to make sure      that she is not having more frequent paroxysmal atrial fibrillation than      she is aware.      Rosaria Ferries, P.A. [facility]      Jacqulyn Ducking, M.D. [facility]  Electronically Signed    RB/MEDQ  D:  08/30/2005  T:  08/30/2005  Job:  [id number]

## 2010-07-22 NOTE — H&P (Signed)
NAMELAYELLE, [name] NO.:  [id number]   MEDICAL RECORD NO.:  [id number]          PATIENT TYPE:  INP   LOCATION:  5729                         FACILITY:  [facility]   PHYSICIAN:  Biagio Borg, M.D. LHCDATE OF BIRTH:  11/03/1935   DATE OF ADMISSION:  01/30/2005  DATE OF DISCHARGE:                                HISTORY & PHYSICAL   CHIEF COMPLAINT:  Abdominal pain, nausea, vomiting, worsening over the past  several Weeks   HISTORY OF PRESENT ILLNESS:  [name] is a 75 year old white female here with  abdominal pain and persistent nausea and vomiting for at least 2 weeks.  She  has had several pound weight loss.  The pain is mostly upper abdomen, mostly  right-sided.  She is now with increased pain, anxiety, and breathlessness  with shortness of breath, though it is unclear why she should have the  dyspnea as well otherwise.  November 20 chest x-ray was negative.  No fever,  cough, chest pain.  She finds it difficult to get deep breaths possibly  secondary to the abdominal pain.  She quit tobacco for 3 weeks and is  overall doing better.  Of note is that she had some dysphagia several years  ago, never had EGD, but she was referred to gastroenterology, Dr. Lyla Son, at that time.  Weight in 2004 was 147, then 129, and about 137  September 2006.   PAST MEDICAL HISTORY:   ILLNESSES:  1.  COPD>  2.  History of paroxysmal atrial fibrillation on aspirin only.  3.  GERD.  4.  Autoimmune hemolytic anemia.  5.  Anxiety.  6.  Glucose intolerance.  7.  Chronic vertigo.  8.  DJD.   Of note is that she has seen Dr. Sharlett Iles for gastroenterology concerns,  has an appointment tomorrow but does not feel she can wait that long.  Also,  she has seen Dr. Ron Parker in the past for cardiology concerns.  July 2004 stress  Cardiolite negative.  She was last seen in September 2006.   SURGERIES:  Status post splenectomy.   ALLERGIES:  No known drug allergies.   CURRENT  MEDICATIONS:  1.  Folic acid 1 mg p.o. daily.  2.  Aspirin 81 mg p.o. daily.  3.  Metoprolol 50 mg 1/2 twice daily.  4.  Prevacid 30 mg daily.   SOCIAL HISTORY:  No tobacco, no alcohol.   FAMILY HISTORY:  Coronary artery disease.   PHYSICAL EXAMINATION:  GENERAL: This is an 75 year old white female,  somewhat breathless.  VITAL SIGNS:  Blood pressure 115/65, respirations 20, pulse 70, temperature  96.9.  Weight 129.  HEENT:  Sclerae clear, TMs clear, oropharynx benign.  NECK:  No lymphadenopathy, JVD, thyromegaly.  CHEST:  No rales or wheeze.  CARDIAC:  Regular rate and rhythm, no murmur.  ABDOMEN: Soft with positive bowel sounds. There is at least moderate  tenderness without guarding or rebound to the right upper quadrant somewhat  less than left upper quadrant.  EXTREMITIES:  No edema.   ASSESSMENT AND PLAN:  1.  Abdominal pain,  nausea, vomiting, gradual weight loss recently.  She is      being admitted for IV fluids, pain control, antiemetics, routine labs      including lipase and H. pylori.  I will check abdominal-pelvic CT with      contrast.  She likely needs gastroenterology consult to include possible      EGD.  2.  Other medical problems.  Otherwise continue home medications.           ______________________________  Biagio Borg, M.D. [facility]     JWJ/MEDQ  D:  01/30/2005  T:  01/30/2005  Job:  [id number]

## 2010-07-22 NOTE — Discharge Summary (Signed)
NAMEMALTA, DOERSAM                           ACCOUNT NO.:  [id number]   MEDICAL RECORD NO.:  [id number]                   PATIENT TYPE:  INP   LOCATION:  [id number]                                 FACILITY:  [facility]   PHYSICIAN:  Kirk Ruths, M.D.                DATE OF BIRTH:  15-Oct-1935   DATE OF ADMISSION:  08/14/2002  DATE OF DISCHARGE:  08/15/2002                           DISCHARGE SUMMARY - REFERRING   SUMMARY OF HISTORY:  Ms. Conerly is a 75 year old white female who presents to  [facility] emergency room complaining of palpitations.  She has a history of  irregular heartbeat, which she describes as just an extra beat once in a  while.  She has not had any prolonged fast heartbeats or heard of the  diagnosis of atrial fibrillation.  She stated around seven o'clock on the  morning of the 10th, when she got up to make coffee, she suddenly developed  a fast heartbeat.  When she tried to actually stand up and move around, she  felt like she was going to pass out and describes symptoms of dizziness,  spinning, shortness of breath, and diaphoresis.  She did not actually have  any syncope or associated chest discomfort, nausea, or vomiting.  She rested  all day with continuing symptoms, and she finally called her sister around 3  p.m.  Patient refused ambulance and her sister drove her to the emergency  room.  In the emergency room, she was found to be in atrial fibrillation  with a ventricular rate of approximately 140.  She received two boluses of  10 mg IV Cardizem and then placed her on a drip of 10, which slowed her  atrial fibrillation down, and then while in the emergency room converted  back to normal sinus rhythm.  The patient noted that she had also had  similar symptoms when she had her transfusion.  Since she has had her  transfusion and placed on prednisone for her recent diagnosis of autoimmune  hemolytic anemia, she has felt good until today.   She was recently hospitalized  at [facility] for generalized weakness and  fatigue.  She was found to be anemic, requiring transfusion.  Extensive  workup revealed a diagnosis of autoimmune hemolytic anemia and she has been  treated with a prednisone taper.  She is due to see (?) Dr. Beryle Beams on  June 21 for further evaluation.  It is noted that during this  hospitalization, her stools were heme negative.  There were not any EKGs  performed, nor was she on telemetry.  She also has a history of arthritis,  COPD, and hypertonic bladder.  She also has a history of tobacco use.   LABORATORY DATA:  Admission H & H was 10.1 and 28.1 with platelets 216.  WBC  was 28.0, MCV 104.6, MCHC 35.9.  Sodium 136, potassium 3.8, BUN 17,  creatinine 0.6,  glucose 174, normal LFTs, PTT 26, PT 12.1, CK totals and  troponins were negative for myocardial infarction.   HOSPITAL COURSE:  Ms. Offutt was placed on all home medications as well as her  IV Cardizem, which changed to p.o. dosing.  She was not anticoagulated with  her less than 24-hour episode of paroxysmal atrial fibrillation and her  history of autoimmune hemolytic anemia.  She remained in the hospital  overnight for her continued fatigue and weakness post her episode of atrial  fibrillation.  By the morning of the 11th, she ruled out for myocardial  infarction and she felt fine and remained in normal sinus rhythm.  After  review, Dr. Stanford Breed felt that she could be discharged home.  He felt that  she should continue on a baby aspirin, given her history of anemia and low  risk of embolic factors.  He did not advise Coumadin at this time.   DISCHARGE DIAGNOSES:  1. Paroxysmal atrial fibrillation with a rapid ventricular rate.  2. Recent diagnosis of autoimmune hemolytic anemia.  Hemoglobin and     hematocrit appear stable at this time.  3. Hyperglycemia on admission may be secondary to prednisone therapy.  4. Elevated WBCs may be secondary to her steroids, history as previously.   5. Tobacco use.   DISPOSITION:  Ms. Domres is discharged home.  Her medications include:  1. Baby aspirin 81 mg q.d.  2. Cardizem CD 120 mg q.d.  3. Continue her prednisone taper  4. Albuterol MDI four times q.d. as needed  5. Folate 1 mg q.d.  6. Prilosec 20 q.d.   She was asked to maintain a low salt, fat, and cholesterol diet; avoid  pseudoephedrine products.  She was advised no smoking or tobacco products  and received a smoking cessation consult prior to discharge.  On June 24 at  8:30, she will have an echocardiogram and adenosine Cardiolite at the  [facility] cardiology office.  Her bone density scan that was initially  scheduled for June 11 has been rescheduled for Tuesday, June 15 at 1:30 p.m.  She was asked to keep her followup appointment with Dr. Jenny Reichmann and her  oncologist.  Consideration at the time of followup with Dr. Jenny Reichmann should be  limiting her use of albuterol, given her new diagnosis of paroxysmal atrial  fibrillation.  She will have an appointment with Dr. Stanford Breed on July 12 at  4:15 p.m.  At the time of Dr. Jacalyn Lefevre appointment, echocardiogram and  Cardiolite will be reviewed.  It is also noted at the time of discharge her  TSH level is pending as well as her fasting lipids.  Thus, these will need  to be reviewed.     Sharyl Nimrod, P.A. [facility]                    Kirk Ruths, M.D.    EW/MEDQ  D:  08/15/2002  T:  08/15/2002  Job:  [id number]   cc:   Alyson Locket. Beryle Beams, M.D.  [place]. [place] [address]  Fax: [phone]   Biagio Borg, M.D. [facility]

## 2010-07-22 NOTE — Procedures (Signed)
[facility]  Patient:    [name], [name] Visit Number: [id number] MRN: [id number]          Service Type: Attending:  Elyse Jarvis. Amedeo Plenty, M.D. Dictated by:   Elyse Jarvis Amedeo Plenty, M.D. Proc. Date: 04/25/01   CC:         Selinda Orion, M.D.   Procedure Report  PROCEDURE:  Colonoscopy.  INDICATION FOR PROCEDURE:  Rectal bleeding and fecal obstipation in a 75 year old patient with no previous colon screening.  DESCRIPTION OF PROCEDURE:  The patient was placed in the left lateral decubitus position and placed on the pulse monitor with continuous low-flow oxygen delivered by nasal cannula.  She was sedated with 60 mg of IV Demerol and 7 mg of IV Versed.  The Olympus video colonoscope was inserted into the rectum and advanced to the cecum, confirmed by transillumination at McBurneys point and visualization of the ileocecal valve and appendiceal orifice.  The prep was excellent.  The cecum and ascending colon appeared normal with no masses, polyps, diverticula or other mucosal abnormalities.  The transverse colon likewise appeared normal.  Within the descending and sigmoid colon, there were seen several scattered diverticula and no other abnormalities. The rectum appeared normal down to the anus, where retroflexed view did reveal some small internal hemorrhoids.  The colonoscope was then withdrawn, and the patient returned to the recovery room in stable condition.  She tolerated the procedure well, and there were no immediate complications.  IMPRESSION: 1. Left-sided diverticulosis. 2. Internal hemorrhoids. 3. Otherwise normal study.  PLAN:  Dietary fiber and osmotic laxative as needed for hemorrhoids and constipation. Dictated by:   Elyse Jarvis Amedeo Plenty, M.D. Attending:  Elyse Jarvis. Amedeo Plenty, M.D. DD:  04/25/01 TD:  04/26/01 Job: [id number]

## 2010-07-22 NOTE — Discharge Summary (Signed)
NAMECORLIS, STFLEUR NO.:  [id number]   MEDICAL RECORD NO.:  [id number]          PATIENT TYPE:  INP   LOCATION:  9371                          FACILITY:  [facility]   PHYSICIAN:  Willey Blade, MD  DATE OF BIRTH:  07/13/1935   DATE OF ADMISSION:  08/29/2005  DATE OF DISCHARGE:  08/31/2005                                 DISCHARGE SUMMARY   REASON FOR HOSPITALIZATION:  Third-degree rectocele, second-degree  cystocele, genuine urinary stress incontinence with enterocele and partial  vaginal vault prolapse.   IN-HOSPITAL PROCEDURES:  Anterior posterior repair with enterocele repair,  tension-free, vaginal tape procedure, cystoscopy, uterosacral ligament  vaginal vault suspension.   FINAL DIAGNOSES:  Third-degree rectocele, second-degree cystocele, genuine  urinary stress incontinence with enterocele and partial vaginal vault  prolapse.   HOSPITAL COURSE:  This is a 75 year old Caucasian female who underwent the  aforementioned procedures on the 26th of June, 2007.  The patient was noted  to have an unremarkable postoperative course.  She was afebrile.  Postoperative hemoglobin was 10.8 from a pre-op of 13.6 and a hematocrit of  32.2 from a pre-op of 41.7.  The patient developed an episode of atrial  fibrillation, for which she has been treated chronically in the past.  A  consultation with [facility] revealed no evidence of acute  myocardial infarction or any other findings consistent with an acute cardiac  event.  The patient had refused postvoid residual checks.  However, she  appears to be voiding adequately, between 100 and 400 mL every 6 hours.  Her  oral intake has been minimal, but given her thin stature it is more than  likely compatible with her dietary habits.  She has been afebrile.  Abdomen  is soft.  Lungs demonstrate slight congestion consistent with a history of  COPD.  Calves are without tenderness.  Scant vaginal drainage.   The patient  was discharged with her routine home medications including  metoprolol 25 mg, 1 twice a day, estradiol 1 mg daily, folic acid 1 mg daily  and a baby aspirin 81 mg daily.  She was also prescribed Vicodin 1-2 every 4-  6 hours for pain in addition to Keflex 500 mg twice a day for 1 week.  She  was asked to return to the clinic in 4 weeks for postoperative followup.  The patient was advised to contact the office for temperature elevation  above 100.4 degrees Fahrenheit, check her temperature once daily, increasing  abdominal pain, constipation, difficulty with voiding, urinary retention,  increasing vaginal bleeding or any other concerns.  All questions were  answered satisfactorily.      Willey Blade, MD  Electronically Signed     SHB/MEDQ  D:  08/31/2005  T:  08/31/2005  Job:  [id number]

## 2010-07-27 ENCOUNTER — Telehealth: Payer: Self-pay | Admitting: *Deleted

## 2010-07-27 NOTE — Telephone Encounter (Signed)
Needs OV if sick Thx

## 2010-07-27 NOTE — Telephone Encounter (Signed)
rec rf req for Hydroco/Homatropine syrup. Take 5 mls po qid # 360ml. Last filled 07-14-10. Ok to rf?

## 2010-07-28 NOTE — Telephone Encounter (Signed)
Left detailed mess advising of below

## 2010-09-01 ENCOUNTER — Telehealth: Payer: Self-pay | Admitting: *Deleted

## 2010-09-01 NOTE — Telephone Encounter (Signed)
If ok pt wants RF to go to [facility]

## 2010-09-01 NOTE — Telephone Encounter (Signed)
Patient requesting RF of lorazepam. She has apt scheduled for 10/2010

## 2010-09-02 ENCOUNTER — Telehealth: Payer: Self-pay | Admitting: *Deleted

## 2010-09-02 NOTE — Telephone Encounter (Signed)
rec Rf req for Hydroco/Homatrophine syp  5 mls po qid # 300 ml. Last filled 07-14-10. Ok to Rf?

## 2010-09-02 NOTE — Telephone Encounter (Signed)
OK to fill this prescription with additional refills x2 Thank you!  

## 2010-09-02 NOTE — Telephone Encounter (Signed)
Pt informed she does not need this med. This was error

## 2010-09-02 NOTE — Telephone Encounter (Signed)
Use Delsym. OV Sat Clinic if sick Thx

## 2010-09-03 MED ORDER — LORAZEPAM 1 MG PO TABS: 1.0000 mg | ORAL_TABLET | Freq: Three times a day (TID) | ORAL | Status: DC | PRN

## 2010-09-03 NOTE — Telephone Encounter (Signed)
Called refill into [facility] spoke with [place]. Called pt no answer lmom rx sent to [facility].Marland KitchenMarland Kitchen6/30/12@9 :38am/LMB

## 2010-09-24 ENCOUNTER — Other Ambulatory Visit: Payer: Self-pay | Admitting: Cardiology

## 2010-10-25 ENCOUNTER — Encounter: Payer: Self-pay | Admitting: Internal Medicine

## 2010-10-25 ENCOUNTER — Ambulatory Visit (INDEPENDENT_AMBULATORY_CARE_PROVIDER_SITE_OTHER): Payer: Medicare Other | Admitting: Internal Medicine

## 2010-10-25 ENCOUNTER — Other Ambulatory Visit: Payer: Self-pay | Admitting: Cardiology

## 2010-10-25 DIAGNOSIS — I4891 Unspecified atrial fibrillation: Secondary | ICD-10-CM

## 2010-10-25 DIAGNOSIS — E559 Vitamin D deficiency, unspecified: Secondary | ICD-10-CM

## 2010-10-25 DIAGNOSIS — J309 Allergic rhinitis, unspecified: Secondary | ICD-10-CM

## 2010-10-25 DIAGNOSIS — K279 Peptic ulcer, site unspecified, unspecified as acute or chronic, without hemorrhage or perforation: Secondary | ICD-10-CM

## 2010-10-25 DIAGNOSIS — E538 Deficiency of other specified B group vitamins: Secondary | ICD-10-CM

## 2010-10-25 MED ORDER — FLUTICASONE PROPIONATE 50 MCG/ACT NA SUSP: 2.0000 | Freq: Every day | NASAL | Status: DC

## 2010-10-25 NOTE — Assessment & Plan Note (Signed)
Cont w/Rx

## 2010-10-25 NOTE — Assessment & Plan Note (Signed)
It is worse Start Flonase

## 2010-10-25 NOTE — Assessment & Plan Note (Signed)
On Prilosec

## 2010-10-25 NOTE — Progress Notes (Signed)
  Subjective:    Patient ID: [name], female    DOB: 11-Mar-1935, 75 y.o.   MRN: [id number]  HPI  The patient presents for a follow-up of  chronic hypertension, COPD, anxiety, OA controlled with medicines    Review of Systems  Constitutional: Negative for chills, activity change, appetite change, fatigue and unexpected weight change.  HENT: Positive for congestion. Negative for mouth sores and sinus pressure.   Eyes: Negative for visual disturbance.  Respiratory: Positive for shortness of breath. Negative for cough and chest tightness.   Gastrointestinal: Negative for nausea and abdominal pain.  Genitourinary: Negative for frequency, difficulty urinating and vaginal pain.  Musculoskeletal: Negative for back pain and gait problem.  Skin: Negative for pallor and rash.  Neurological: Negative for dizziness, tremors, weakness, numbness and headaches.  Psychiatric/Behavioral: Negative for confusion and sleep disturbance. The patient is nervous/anxious.        Objective:   Physical Exam  Constitutional: She appears well-developed and well-nourished. No distress.  HENT:  Head: Normocephalic.  Right Ear: External ear normal.  Left Ear: External ear normal.  Nose: Nose normal.  Mouth/Throat: Oropharynx is clear and moist.       Swollen nasal mucosa  Eyes: Conjunctivae are normal. Pupils are equal, round, and reactive to light. Right eye exhibits no discharge. Left eye exhibits no discharge.  Neck: Normal range of motion. Neck supple. No JVD present. No tracheal deviation present. No thyromegaly present.  Cardiovascular: Normal rate, regular rhythm and normal heart sounds.   Pulmonary/Chest: No stridor. No respiratory distress. She has no wheezes.       Decreased BS B  Abdominal: Soft. Bowel sounds are normal. She exhibits no distension and no mass. There is no tenderness. There is no rebound and no guarding.  Musculoskeletal: She exhibits no edema and no tenderness.  Lymphadenopathy:     She has no cervical adenopathy.  Neurological: She displays normal reflexes. No cranial nerve deficit. She exhibits normal muscle tone. Coordination normal.  Skin: No rash noted. No erythema.  Psychiatric: She has a normal mood and affect. Her behavior is normal. Judgment and thought content normal.          Assessment & Plan:

## 2010-10-25 NOTE — Assessment & Plan Note (Signed)
On Rx 

## 2010-10-25 NOTE — Assessment & Plan Note (Signed)
Cont Rx 

## 2010-10-26 ENCOUNTER — Other Ambulatory Visit: Payer: Self-pay | Admitting: *Deleted

## 2010-10-26 MED ORDER — FLECAINIDE ACETATE 50 MG PO TABS: 50.0000 mg | ORAL_TABLET | Freq: Two times a day (BID) | ORAL | Status: DC

## 2010-10-26 NOTE — Telephone Encounter (Signed)
rx sent in today. Julaine Hua

## 2010-10-28 ENCOUNTER — Emergency Department (HOSPITAL_COMMUNITY)
Admission: EM | Admit: 2010-10-28 | Discharge: 2010-10-28 | Disposition: A | Discharge status: Home / Self Care | Payer: Medicare Other | Attending: Emergency Medicine | Admitting: Emergency Medicine

## 2010-10-28 ENCOUNTER — Emergency Department (HOSPITAL_COMMUNITY): Payer: Medicare Other

## 2010-10-28 DIAGNOSIS — R55 Syncope and collapse: Secondary | ICD-10-CM | POA: Insufficient documentation

## 2010-10-28 DIAGNOSIS — Z8711 Personal history of peptic ulcer disease: Secondary | ICD-10-CM | POA: Insufficient documentation

## 2010-10-28 DIAGNOSIS — R0602 Shortness of breath: Secondary | ICD-10-CM | POA: Insufficient documentation

## 2010-10-28 DIAGNOSIS — J438 Other emphysema: Secondary | ICD-10-CM | POA: Insufficient documentation

## 2010-10-28 DIAGNOSIS — I10 Essential (primary) hypertension: Secondary | ICD-10-CM | POA: Insufficient documentation

## 2010-10-28 DIAGNOSIS — R42 Dizziness and giddiness: Secondary | ICD-10-CM | POA: Insufficient documentation

## 2010-10-28 LAB — CK TOTAL AND CKMB (NOT AT ARMC)
CK, MB: 2.5 ng/mL (ref 0.3–4.0)
Relative Index: 2 (ref 0.0–2.5)
Total CK: 128 U/L (ref 7–177)

## 2010-10-28 LAB — POCT I-STAT, CHEM 8
BUN: 24 mg/dL — ABNORMAL HIGH (ref 6–23)
Calcium, Ion: 1.17 mmol/L (ref 1.12–1.32)
Chloride: 109 mEq/L (ref 96–112)
Creatinine, Ser: 1.1 mg/dL (ref 0.50–1.10)
Glucose, Bld: 72 mg/dL (ref 70–99)
HCT: 39 % (ref 36.0–46.0)
Hemoglobin: 13.3 g/dL (ref 12.0–15.0)
Potassium: 4.2 mEq/L (ref 3.5–5.1)
Sodium: 142 mEq/L (ref 135–145)
TCO2: 24 mmol/L (ref 0–100)

## 2010-10-28 LAB — CBC
HCT: 35.9 % — ABNORMAL LOW (ref 36.0–46.0)
Hemoglobin: 11.3 g/dL — ABNORMAL LOW (ref 12.0–15.0)
MCH: 28.1 pg (ref 26.0–34.0)
MCHC: 31.5 g/dL (ref 30.0–36.0)
MCV: 89.3 fL (ref 78.0–100.0)
Platelets: 463 10*3/uL — ABNORMAL HIGH (ref 150–400)
RBC: 4.02 MIL/uL (ref 3.87–5.11)
RDW: 17.2 % — ABNORMAL HIGH (ref 11.5–15.5)
WBC: 11.4 10*3/uL — ABNORMAL HIGH (ref 4.0–10.5)

## 2010-10-28 LAB — URINALYSIS, ROUTINE W REFLEX MICROSCOPIC
Bilirubin Urine: NEGATIVE
Glucose, UA: NEGATIVE mg/dL
Hgb urine dipstick: NEGATIVE
Ketones, ur: NEGATIVE mg/dL
Leukocytes, UA: NEGATIVE
Nitrite: NEGATIVE
Protein, ur: NEGATIVE mg/dL
Specific Gravity, Urine: 1.016 (ref 1.005–1.030)
Urobilinogen, UA: 0.2 mg/dL (ref 0.0–1.0)
pH: 5.5 (ref 5.0–8.0)

## 2010-10-28 LAB — DIFFERENTIAL
Basophils Absolute: 0.1 10*3/uL (ref 0.0–0.1)
Basophils Relative: 1 % (ref 0–1)
Eosinophils Absolute: 0.3 10*3/uL (ref 0.0–0.7)
Eosinophils Relative: 3 % (ref 0–5)
Lymphocytes Relative: 37 % (ref 12–46)
Lymphs Abs: 4.2 10*3/uL — ABNORMAL HIGH (ref 0.7–4.0)
Monocytes Absolute: 1.6 10*3/uL — ABNORMAL HIGH (ref 0.1–1.0)
Monocytes Relative: 14 % — ABNORMAL HIGH (ref 3–12)
Neutro Abs: 5.3 10*3/uL (ref 1.7–7.7)
Neutrophils Relative %: 47 % (ref 43–77)

## 2010-10-28 LAB — POCT I-STAT TROPONIN I: Troponin i, poc: 0.01 ng/mL (ref 0.00–0.08)

## 2010-10-30 LAB — URINE CULTURE
Colony Count: NO GROWTH
Culture  Setup Time: 201208250234
Culture: NO GROWTH

## 2010-11-15 ENCOUNTER — Ambulatory Visit: Payer: Medicare Other | Admitting: Cardiology

## 2010-11-22 ENCOUNTER — Other Ambulatory Visit: Payer: Self-pay | Admitting: Cardiology

## 2010-11-23 MED ORDER — FLECAINIDE ACETATE 50 MG PO TABS: 50.0000 mg | ORAL_TABLET | Freq: Two times a day (BID) | ORAL | Status: DC

## 2010-11-29 ENCOUNTER — Ambulatory Visit: Payer: Medicare Other | Admitting: Cardiology

## 2010-12-03 ENCOUNTER — Other Ambulatory Visit: Payer: Self-pay | Admitting: Internal Medicine

## 2010-12-05 ENCOUNTER — Telehealth: Payer: Self-pay | Admitting: *Deleted

## 2010-12-05 LAB — CARDIAC PANEL(CRET KIN+CKTOT+MB+TROPI)
CK, MB: 1.2
CK, MB: 1.3
Relative Index: INVALID
Relative Index: INVALID
Total CK: 17
Total CK: 57
Troponin I: 0.03
Troponin I: 0.03

## 2010-12-05 LAB — PROTIME-INR
INR: 0.9
Prothrombin Time: 12.1

## 2010-12-05 LAB — POCT I-STAT, CHEM 8
BUN: 11
Calcium, Ion: 1.12
Chloride: 110
Creatinine, Ser: 0.8
Glucose, Bld: 86
HCT: 42
Hemoglobin: 14.3
Potassium: 4.4
Sodium: 141
TCO2: 24

## 2010-12-05 LAB — LIPID PANEL
Cholesterol: 224 — ABNORMAL HIGH
HDL: 42
LDL Cholesterol: 154 — ABNORMAL HIGH
Total CHOL/HDL Ratio: 5.3
Triglycerides: 140
VLDL: 28

## 2010-12-05 LAB — CBC
HCT: 41
HCT: 41.5
Hemoglobin: 13.6
Hemoglobin: 13.7
MCHC: 33
MCHC: 33.2
MCV: 92.5
MCV: 93.3
Platelets: 405 — ABNORMAL HIGH
Platelets: 412 — ABNORMAL HIGH
RBC: 4.44
RBC: 4.45
RDW: 14.5
RDW: 14.6
WBC: 11.1 — ABNORMAL HIGH
WBC: 12.6 — ABNORMAL HIGH

## 2010-12-05 LAB — URINE CULTURE
Colony Count: NO GROWTH
Culture: NO GROWTH

## 2010-12-05 LAB — TROPONIN I: Troponin I: <0.01

## 2010-12-05 LAB — DIFFERENTIAL
Basophils Absolute: 0.2 — ABNORMAL HIGH
Basophils Relative: 2 — ABNORMAL HIGH
Eosinophils Absolute: 0.5
Eosinophils Relative: 4
Lymphocytes Relative: 40
Lymphs Abs: 5.1 — ABNORMAL HIGH
Monocytes Absolute: 1.3 — ABNORMAL HIGH
Monocytes Relative: 10
Neutro Abs: 5.5
Neutrophils Relative %: 44

## 2010-12-05 LAB — POCT CARDIAC MARKERS
CKMB, poc: <1 — ABNORMAL LOW
Myoglobin, poc: 50.2
Troponin i, poc: <0.05

## 2010-12-05 LAB — URINALYSIS, ROUTINE W REFLEX MICROSCOPIC
Bilirubin Urine: NEGATIVE
Glucose, UA: NEGATIVE
Hgb urine dipstick: NEGATIVE
Ketones, ur: NEGATIVE
Nitrite: NEGATIVE
Protein, ur: NEGATIVE
Specific Gravity, Urine: 1.008
Urobilinogen, UA: 0.2
pH: 6

## 2010-12-05 LAB — BASIC METABOLIC PANEL
BUN: 9
CO2: 28
Calcium: 9
Chloride: 108
Creatinine, Ser: 0.79
GFR calc Af Amer: >60
GFR calc non Af Amer: >60
Glucose, Bld: 49 — ABNORMAL LOW
Potassium: 3.9
Sodium: 142

## 2010-12-05 LAB — CK TOTAL AND CKMB (NOT AT ARMC)
CK, MB: 4.3 — ABNORMAL HIGH
Relative Index: 4.3 — ABNORMAL HIGH
Total CK: 101

## 2010-12-05 LAB — TSH: TSH: 3.043

## 2010-12-05 LAB — B-NATRIURETIC PEPTIDE (CONVERTED LAB)
Pro B Natriuretic peptide (BNP): 255 — ABNORMAL HIGH
Pro B Natriuretic peptide (BNP): 296 — ABNORMAL HIGH

## 2010-12-05 LAB — GLUCOSE, CAPILLARY: Glucose-Capillary: 146 — ABNORMAL HIGH

## 2010-12-05 NOTE — Telephone Encounter (Signed)
Requested Medications     LORazepam (ATIVAN) 1 MG tablet [Pharmacy Med Name: LORAZEPAM 1MG  TABLETS]    TAKE 1 TABLET BY MOUTH EVERY 8 HOURS AS NEEDED FOR ANXIETY    Disp: 90 tablet R: 0 Start: 12/03/2010 Class: Normal    Originally ordered on: 05/11/2010 Last refill: 10/31/2010 Order History

## 2010-12-06 MED ORDER — LORAZEPAM 1 MG PO TABS: 1.0000 mg | ORAL_TABLET | Freq: Three times a day (TID) | ORAL | Status: DC | PRN

## 2010-12-06 NOTE — Telephone Encounter (Signed)
Pt informed

## 2010-12-06 NOTE — Telephone Encounter (Signed)
OK to fill this prescription with additional refills x1 Thank you!  

## 2010-12-06 NOTE — Telephone Encounter (Signed)
Please call pt when complete, she was told by pharm that MD rejected RF request.

## 2010-12-22 ENCOUNTER — Ambulatory Visit (INDEPENDENT_AMBULATORY_CARE_PROVIDER_SITE_OTHER): Payer: Medicare Other | Admitting: Cardiology

## 2010-12-22 ENCOUNTER — Encounter: Payer: Self-pay | Admitting: Cardiology

## 2010-12-22 DIAGNOSIS — I4891 Unspecified atrial fibrillation: Secondary | ICD-10-CM

## 2010-12-22 DIAGNOSIS — I10 Essential (primary) hypertension: Secondary | ICD-10-CM

## 2010-12-22 NOTE — Patient Instructions (Signed)
Your physician wants you to follow-up in: 6 MONTHS You will receive a reminder letter in the mail two months in advance. If you don't receive a letter, please call our office to schedule the follow-up appointment. 

## 2010-12-22 NOTE — Assessment & Plan Note (Signed)
Patient remains in sinus rhythm on examination. Continue Toprol and flecainide. She refuses Coumadin, xeralto, and pradaxa. She understands a higher risk of CVA. Continue aspirin.

## 2010-12-22 NOTE — Assessment & Plan Note (Signed)
Blood pressure controlled. Continue present medications. 

## 2010-12-22 NOTE — Progress Notes (Signed)
[id number] female for fu of paroxysmal atrial fibrillation. We did perform a Myoview on 01/21/08 that showed normal perfusion and an ejection fraction of 73%. Her last echocardiogram in Feb 2012 showed normal LV function, mild biatrial enlargement, mild MR and trace AI. A previous monitor revealed paroxysmal atrial fibrillation/flutter with a rapid ventricular response. Note she refuses Coumadin. A CardioNet was performed in Feb 2012 secondary to palpitations and revealed sinus rhythm with PACs and rare PVCs. I last saw her in June 20, 2010. Since then she continues to have dyspnea on exertion which is chronic but there is no orthopnea, PND, pedal edema, chest pain or syncope. No palpitations.   Current Outpatient Prescriptions  Medication Sig Dispense Refill  . aspirin 81 MG chewable tablet Chew 81 mg by mouth. Two times daily       . Cholecalciferol ([employer] VITAMIN D3) 1000 UNITS capsule Take 1,000 Units by mouth daily.        . flecainide (TAMBOCOR) 50 MG tablet Take 1 tablet (50 mg total) by mouth 2 (two) times daily.  60 tablet  11  . LORazepam (ATIVAN) 1 MG tablet Take 1 tablet (1 mg total) by mouth every 8 (eight) hours as needed for anxiety. As needed, two times a day  90 tablet  1  . metoprolol tartrate (LOPRESSOR) 25 MG tablet Take 25 mg by mouth. Take 1/2 tablet daily.      Marland Kitchen omeprazole (PRILOSEC OTC) 20 MG tablet Take 20 mg by mouth daily.        . vitamin B-12 (CYANOCOBALAMIN) 1000 MCG tablet Take 1,000 mcg by mouth. Take 1/2 tablet daily          Past Medical History  Diagnosis Date  . Atrial fibrillation   . Arthritis   . Hypertension   . B12 deficiency   . Depression   . Anxiety   . GERD (gastroesophageal reflux disease)   . Vitamin D deficiency disease   . COPD (chronic obstructive pulmonary disease)   . Pneumonia   . Peptic ulcer, unspecified site, unspecified as acute or chronic, without mention of hemorrhage, perforation, or obstruction   . Osteoarthrosis, hand    both hands    Past Surgical History  Procedure Date  . Bladder surgery     Bladder tack and intestines  . Splenectomy   . Cystocele repair     History   Social History  . Marital Status: Divorced    Spouse Name: N/A    Number of Children: N/A  . Years of Education: N/A   Occupational History  . Not on file.   Social History Main Topics  . Smoking status: Former Research scientist (life sciences)  . Smokeless tobacco: Never Used  . Alcohol Use: No  . Drug Use: No  . Sexually Active: Not on file   Other Topics Concern  . 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 Remaining systems are negative.  Physical Exam: Well-developed well-nourished in no acute distress.  Skin is warm and dry.  HEENT is normal.  Neck is supple. No thyromegaly.  Chest is clear to auscultation with normal expansion.  Cardiovascular exam is regular rate and rhythm.  Abdominal exam nontender or distended. No masses palpated. Extremities show no edema. neuro grossly intact  ECG 10/28/10 NSR

## 2011-01-13 ENCOUNTER — Telehealth: Payer: Self-pay | Admitting: *Deleted

## 2011-01-13 NOTE — Telephone Encounter (Signed)
Rf req for Hydroco/homatrophine syrup # 300 ml 5 mls po qid. Also, pt called requesting abx for coughing up yellow sputum and sore throat. Please advise.

## 2011-01-13 NOTE — Telephone Encounter (Signed)
Needs OV - Sat Clinic Thx

## 2011-01-16 NOTE — Telephone Encounter (Signed)
Left mess for patient to call back. Pt has f/u scheduled 01-31-11.

## 2011-01-31 ENCOUNTER — Other Ambulatory Visit (INDEPENDENT_AMBULATORY_CARE_PROVIDER_SITE_OTHER): Payer: Medicare Other

## 2011-01-31 ENCOUNTER — Ambulatory Visit (INDEPENDENT_AMBULATORY_CARE_PROVIDER_SITE_OTHER)
Admission: RE | Admit: 2011-01-31 | Discharge: 2011-01-31 | Disposition: A | Discharge status: Home / Self Care | Payer: Medicare Other | Source: Ambulatory Visit | Attending: Internal Medicine | Admitting: Internal Medicine

## 2011-01-31 ENCOUNTER — Other Ambulatory Visit: Payer: Self-pay | Admitting: Internal Medicine

## 2011-01-31 ENCOUNTER — Encounter: Payer: Self-pay | Admitting: Internal Medicine

## 2011-01-31 ENCOUNTER — Ambulatory Visit (INDEPENDENT_AMBULATORY_CARE_PROVIDER_SITE_OTHER): Payer: Medicare Other | Admitting: Internal Medicine

## 2011-01-31 VITALS — BP 140/80 | HR 76 | Temp 97.8°F | Resp 16 | Ht 64.25 in | Wt 126.0 lb

## 2011-01-31 DIAGNOSIS — M545 Low back pain, unspecified: Secondary | ICD-10-CM

## 2011-01-31 DIAGNOSIS — E559 Vitamin D deficiency, unspecified: Secondary | ICD-10-CM

## 2011-01-31 DIAGNOSIS — E538 Deficiency of other specified B group vitamins: Secondary | ICD-10-CM

## 2011-01-31 DIAGNOSIS — F411 Generalized anxiety disorder: Secondary | ICD-10-CM

## 2011-01-31 DIAGNOSIS — J449 Chronic obstructive pulmonary disease, unspecified: Secondary | ICD-10-CM

## 2011-01-31 DIAGNOSIS — Z79899 Other long term (current) drug therapy: Secondary | ICD-10-CM

## 2011-01-31 DIAGNOSIS — Z0000 Encounter for general adult medical examination without abnormal findings: Secondary | ICD-10-CM

## 2011-01-31 DIAGNOSIS — J4489 Other specified chronic obstructive pulmonary disease: Secondary | ICD-10-CM

## 2011-01-31 LAB — URINALYSIS
Bilirubin Urine: NEGATIVE
Hgb urine dipstick: NEGATIVE
Ketones, ur: NEGATIVE
Leukocytes, UA: NEGATIVE
Nitrite: NEGATIVE
Specific Gravity, Urine: 1.02 (ref 1.000–1.030)
Total Protein, Urine: NEGATIVE
Urine Glucose: NEGATIVE
Urobilinogen, UA: 0.2 (ref 0.0–1.0)
pH: 6 (ref 5.0–8.0)

## 2011-01-31 LAB — HEPATIC FUNCTION PANEL
ALT: 14 U/L (ref 0–35)
AST: 23 U/L (ref 0–37)
Albumin: 3.8 g/dL (ref 3.5–5.2)
Alkaline Phosphatase: 69 U/L (ref 39–117)
Bilirubin, Direct: 0 mg/dL (ref 0.0–0.3)
Total Bilirubin: 0.5 mg/dL (ref 0.3–1.2)
Total Protein: 7 g/dL (ref 6.0–8.3)

## 2011-01-31 LAB — CBC WITH DIFFERENTIAL/PLATELET
Basophils Absolute: 0.1 10*3/uL (ref 0.0–0.1)
Basophils Relative: 0.5 % (ref 0.0–3.0)
Eosinophils Absolute: 0.4 10*3/uL (ref 0.0–0.7)
Eosinophils Relative: 4 % (ref 0.0–5.0)
HCT: 37 % (ref 36.0–46.0)
Hemoglobin: 11.9 g/dL — ABNORMAL LOW (ref 12.0–15.0)
Lymphocytes Relative: 25.3 % (ref 12.0–46.0)
Lymphs Abs: 2.6 10*3/uL (ref 0.7–4.0)
MCHC: 32.2 g/dL (ref 30.0–36.0)
MCV: 87.9 fl (ref 78.0–100.0)
Monocytes Absolute: 1 10*3/uL (ref 0.1–1.0)
Monocytes Relative: 9.5 % (ref 3.0–12.0)
Neutro Abs: 6.3 10*3/uL (ref 1.4–7.7)
Neutrophils Relative %: 60.7 % (ref 43.0–77.0)
Platelets: 432 10*3/uL — ABNORMAL HIGH (ref 150.0–400.0)
RBC: 4.21 Mil/uL (ref 3.87–5.11)
RDW: 16.4 % — ABNORMAL HIGH (ref 11.5–14.6)
WBC: 10.4 10*3/uL (ref 4.5–10.5)

## 2011-01-31 LAB — BASIC METABOLIC PANEL
BUN: 20 mg/dL (ref 6–23)
CO2: 27 mEq/L (ref 19–32)
Calcium: 9.4 mg/dL (ref 8.4–10.5)
Chloride: 108 mEq/L (ref 96–112)
Creatinine, Ser: 0.9 mg/dL (ref 0.4–1.2)
GFR: 66.93 mL/min (ref >60.00)
Glucose, Bld: 87 mg/dL (ref 70–99)
Potassium: 5.4 mEq/L — ABNORMAL HIGH (ref 3.5–5.1)
Sodium: 143 mEq/L (ref 135–145)

## 2011-01-31 LAB — VITAMIN B12: Vitamin B-12: 743 pg/mL (ref 211–911)

## 2011-01-31 LAB — LDL CHOLESTEROL, DIRECT: Direct LDL: 144.3 mg/dL

## 2011-01-31 LAB — SEDIMENTATION RATE: Sed Rate: 16 mm/hr (ref 0–22)

## 2011-01-31 LAB — LIPID PANEL
Cholesterol: 225 mg/dL — ABNORMAL HIGH (ref 0–200)
HDL: 72.3 mg/dL (ref >39.00)
Total CHOL/HDL Ratio: 3
Triglycerides: 102 mg/dL (ref 0.0–149.0)
VLDL: 20.4 mg/dL (ref 0.0–40.0)

## 2011-01-31 LAB — TSH: TSH: 2.02 u[IU]/mL (ref 0.35–5.50)

## 2011-01-31 MED ORDER — LORAZEPAM 1 MG PO TABS: 1.0000 mg | ORAL_TABLET | Freq: Three times a day (TID) | ORAL | Status: DC | PRN

## 2011-01-31 NOTE — Assessment & Plan Note (Signed)
Due to smoking, h/o mech ventilation PNA

## 2011-01-31 NOTE — Patient Instructions (Signed)
Exercise for range of motion

## 2011-01-31 NOTE — Assessment & Plan Note (Signed)
Continue with current prescription therapy as reflected on the Med list.  

## 2011-01-31 NOTE — Progress Notes (Signed)
  Subjective:    Patient ID: [name], female    DOB: 30-Jul-1935, 75 y.o.   MRN: [id number]  HPI  The patient is here for a wellness exam. The patient has been doing well overall without major physical or psychological issues going on lately. C/o severe LBP x 2-3 wks, worse w/ROM R>>L irrad to R hip Review of Systems  Constitutional: Negative for fever, chills, diaphoresis, activity change, appetite change, fatigue and unexpected weight change.  HENT: Negative for hearing loss, ear pain, congestion, sore throat, sneezing, mouth sores, neck pain, dental problem, voice change, postnasal drip and sinus pressure.   Eyes: Negative for pain and visual disturbance.  Respiratory: Positive for shortness of breath. Negative for cough, chest tightness, wheezing and stridor.   Cardiovascular: Negative for chest pain, palpitations and leg swelling.  Gastrointestinal: Negative for nausea, vomiting, abdominal pain, blood in stool, abdominal distention and rectal pain.  Genitourinary: Negative for dysuria, hematuria, decreased urine volume, vaginal bleeding, vaginal discharge, difficulty urinating, vaginal pain and menstrual problem.  Musculoskeletal: Positive for back pain. Negative for joint swelling and gait problem.  Skin: Negative for color change, rash and wound.  Neurological: Negative for dizziness, tremors, syncope, speech difficulty and light-headedness.  Hematological: Negative for adenopathy.  Psychiatric/Behavioral: Negative for suicidal ideas, hallucinations, behavioral problems, confusion, sleep disturbance, dysphoric mood and decreased concentration. The patient is not hyperactive.    Wt Readings from Last 3 Encounters:  01/31/11 126 lb (57.153 kg)  12/22/10 125 lb 6.4 oz (56.881 kg)  10/25/10 128 lb (58.06 kg)        Objective:   Physical Exam  Constitutional: She appears well-developed. No distress.       thin  HENT:  Head: Normocephalic.  Right Ear: External ear normal.  Left  Ear: External ear normal.  Nose: Nose normal.  Mouth/Throat: Oropharynx is clear and moist.  Eyes: Conjunctivae are normal. Pupils are equal, round, and reactive to light. Right eye exhibits no discharge. Left eye exhibits no discharge.  Neck: Normal range of motion. Neck supple. No JVD present. No tracheal deviation present. No thyromegaly present.  Cardiovascular: Normal rate, regular rhythm and normal heart sounds.   Pulmonary/Chest: No stridor. No respiratory distress. She has no wheezes.  Abdominal: Soft. Bowel sounds are normal. She exhibits no distension and no mass. There is no tenderness. There is no rebound and no guarding.  Musculoskeletal: She exhibits tenderness (R LS is tender, decr ROM). She exhibits no edema.  Lymphadenopathy:    She has no cervical adenopathy.  Neurological: She displays normal reflexes. No cranial nerve deficit. She exhibits normal muscle tone. Coordination normal.  Skin: No rash noted. No erythema.  Psychiatric: She has a normal mood and affect. Her behavior is normal. Judgment and thought content normal.          Assessment & Plan:

## 2011-01-31 NOTE — Assessment & Plan Note (Signed)
The patient is here for annual Medicare wellness examination and management of other chronic and acute problems.   The risk factors are reflected in the social history.  The roster of all physicians providing medical care to patient - is listed in the Snapshot section of the chart.  Activities of daily living:  The patient is 100% inedpendent in all ADLs: dressing, toileting, feeding as well as independent mobility  Home safety : The patient has smoke detectors in the home. They wear seatbelts.No firearms at home ( firearms are present in the home, kept in a safe fashion). There is no violence in the home.   There is no risks for hepatitis, STDs or HIV. There is no   history of blood transfusion. They have no travel history to infectious disease endemic areas of the world.  The patient has (has not) seen their dentist in the last six month. They have (not) seen their eye doctor in the last year. They deny (admit to) any hearing difficulty and have not had audiologic testing in the last year.  They do not  have excessive sun exposure. Discussed the need for sun protection: hats, long sleeves and use of sunscreen if there is significant sun exposure.   Diet: the importance of a healthy diet is discussed. They do have a healthy (unhealthy-high fat/fast food) diet.  The patient has a regular exercise program: no.  The benefits of regular aerobic exercise were discussed.  Depression screen: there are no signs or vegative symptoms of depression- irritability, change in appetite, anhedonia, sadness/tearfullness.  Cognitive assessment: the patient manages all their financial and personal affairs and is actively engaged. They could relate day,date,year and events; recalled 3/3 objects at 3 minutes; performed clock-face test normally.  The following portions of the patient's history were reviewed and updated as appropriate: allergies, current medications, past family history, past medical history,  past  surgical history, past social history  and problem list.  Vision, hearing, body mass index were assessed and reviewed.   During the course of the visit the patient was educated and counseled about appropriate screening and preventive services including : fall prevention , diabetes screening, nutrition counseling, colorectal cancer screening, and recommended immunizations.

## 2011-01-31 NOTE — Assessment & Plan Note (Signed)
X ray LS spine Labs Refused pain meds

## 2011-02-01 ENCOUNTER — Telehealth: Payer: Self-pay | Admitting: Internal Medicine

## 2011-02-01 LAB — VITAMIN D 25 HYDROXY (VIT D DEFICIENCY, FRACTURES): Vit D, 25-Hydroxy: 56 ng/mL (ref 30–89)

## 2011-02-01 MED ORDER — MELOXICAM 7.5 MG PO TABS: 7.5000 mg | ORAL_TABLET | Freq: Every day | ORAL | Status: DC

## 2011-02-01 NOTE — Telephone Encounter (Signed)
Pt advised of Lab result but she is also requesting result of Xray. Pt wants MD to be aware that is nothing is found on Xray she would like MRI because she is having severe pain. Please advise.

## 2011-02-01 NOTE — Telephone Encounter (Signed)
Try Meloxicam qd. OV in 2 wks. Thx

## 2011-02-01 NOTE — Telephone Encounter (Signed)
Left message on machine for pt to return my call  

## 2011-02-01 NOTE — Telephone Encounter (Signed)
[name], please, inform patient that all labs are ok Thx 

## 2011-02-02 NOTE — Telephone Encounter (Signed)
Called number above- unable to complete call due to network difficulties.  Will retry later.

## 2011-02-13 MED ORDER — MELOXICAM 7.5 MG PO TABS: 7.5000 mg | ORAL_TABLET | Freq: Every day | ORAL | Status: DC

## 2011-02-13 NOTE — Telephone Encounter (Signed)
Left mess for patient to call back.  

## 2011-02-13 NOTE — Telephone Encounter (Signed)
Pt informed. OV scheduled.

## 2011-02-23 ENCOUNTER — Encounter: Payer: Self-pay | Admitting: Internal Medicine

## 2011-02-23 ENCOUNTER — Ambulatory Visit (INDEPENDENT_AMBULATORY_CARE_PROVIDER_SITE_OTHER): Payer: Medicare Other | Admitting: Internal Medicine

## 2011-02-23 DIAGNOSIS — I10 Essential (primary) hypertension: Secondary | ICD-10-CM

## 2011-02-23 DIAGNOSIS — M545 Low back pain, unspecified: Secondary | ICD-10-CM

## 2011-02-23 MED ORDER — PREDNISONE 10 MG PO TABS: ORAL_TABLET | ORAL | Status: DC

## 2011-02-23 NOTE — Progress Notes (Signed)
  Subjective:    Patient ID: [name], female    DOB: 02/23/1936, 75 y.o.   MRN: [id number]  HPI  C/o R LBP going down R leg 10/10 at times x 1 mo  Review of Systems  Constitutional: Negative for chills, activity change, appetite change, fatigue and unexpected weight change.  HENT: Negative for congestion, mouth sores and sinus pressure.   Eyes: Negative for visual disturbance.  Respiratory: Negative for cough and chest tightness.   Gastrointestinal: Negative for nausea and abdominal pain.  Genitourinary: Negative for frequency, difficulty urinating and vaginal pain.  Musculoskeletal: Positive for back pain and gait problem.  Skin: Negative for pallor and rash.  Neurological: Negative for dizziness, tremors, weakness, numbness and headaches.  Psychiatric/Behavioral: Negative for confusion and sleep disturbance.       Objective:   Physical Exam  Constitutional: She appears well-developed. No distress.  HENT:  Head: Normocephalic.  Right Ear: External ear normal.  Left Ear: External ear normal.  Nose: Nose normal.  Mouth/Throat: Oropharynx is clear and moist.  Eyes: Conjunctivae are normal. Pupils are equal, round, and reactive to light. Right eye exhibits no discharge. Left eye exhibits no discharge.  Neck: Normal range of motion. Neck supple. No JVD present. No tracheal deviation present. No thyromegaly present.  Cardiovascular: Normal rate, regular rhythm and normal heart sounds.   Pulmonary/Chest: No stridor. No respiratory distress. She has no wheezes.  Abdominal: Soft. Bowel sounds are normal. She exhibits no distension and no mass. There is no tenderness. There is no rebound and no guarding.  Musculoskeletal: She exhibits tenderness. She exhibits no edema.       R str leg elev is (+) LS is tender on R  Lymphadenopathy:    She has no cervical adenopathy.  Neurological: She displays normal reflexes. No cranial nerve deficit. She exhibits normal muscle tone.  Skin: No  rash noted. No erythema.  Psychiatric: She has a normal mood and affect. Her behavior is normal. Judgment and thought content normal.          Assessment & Plan:

## 2011-02-23 NOTE — Assessment & Plan Note (Signed)
2012 [facility]; 12/12 more R radiculopathy type sx's RADIOLOGY REPORT*  Clinical Data: Low back pain extending into the right lower  extremity.  LUMBAR SPINE - 2-3 VIEW  Comparison: Abdomen view 09/18/2005.  Findings: Degenerative disc disease changes at L5-S1 with this  space narrowing and early spurring. Mild facet disease in the  lower lumbar spine. No fracture or malalignment. Aorta is heavily  calcified. SI joints are symmetric and unremarkable.  IMPRESSION:  Mild spondylosis in the lower lumbar spine. No acute findings.  Original Report Authenticated By: Raelyn Number, M.D.   MRI if not better Declined opioids, muscle relaxer Prednisone 10 mg: take 4 tabs a day x 3 days; then 3 tabs a day x 4 days; then 2 tabs a day x 4 days, then 1 tab a day x 6 days, then stop. Take pc.  Potential benefits of a short term steroid  use as well as potential risks  and complications were explained to the patient and were aknowledged.

## 2011-02-23 NOTE — Assessment & Plan Note (Signed)
Continue with current prescription therapy as reflected on the Med list.  

## 2011-04-04 ENCOUNTER — Ambulatory Visit: Payer: Medicare Other | Admitting: Internal Medicine

## 2011-04-04 DIAGNOSIS — Z0289 Encounter for other administrative examinations: Secondary | ICD-10-CM

## 2011-05-23 ENCOUNTER — Other Ambulatory Visit: Payer: Self-pay | Admitting: Cardiology

## 2011-06-22 ENCOUNTER — Telehealth: Payer: Self-pay | Admitting: *Deleted

## 2011-06-22 DIAGNOSIS — M2550 Pain in unspecified joint: Secondary | ICD-10-CM

## 2011-06-22 NOTE — Telephone Encounter (Signed)
Ok - Dr Charlestine Night Thx

## 2011-06-22 NOTE — Telephone Encounter (Signed)
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 Pt only wanted to schedule OV w/you [Done, Monday 04.22.13 @ 4:15pm]; requesting referral to Rheumatology for this problem, can we go ahead and place referral before OV so as to have the process started for patient. Please advise.

## 2011-06-22 NOTE — Telephone Encounter (Signed)
Patient informed. 

## 2011-06-26 ENCOUNTER — Ambulatory Visit (INDEPENDENT_AMBULATORY_CARE_PROVIDER_SITE_OTHER): Payer: Medicare Other | Admitting: Internal Medicine

## 2011-06-26 ENCOUNTER — Encounter: Payer: Self-pay | Admitting: Internal Medicine

## 2011-06-26 ENCOUNTER — Other Ambulatory Visit (INDEPENDENT_AMBULATORY_CARE_PROVIDER_SITE_OTHER): Payer: Medicare Other

## 2011-06-26 VITALS — BP 138/90 | HR 84 | Temp 97.7°F | Resp 16 | Wt 130.0 lb

## 2011-06-26 DIAGNOSIS — E538 Deficiency of other specified B group vitamins: Secondary | ICD-10-CM

## 2011-06-26 DIAGNOSIS — M129 Arthropathy, unspecified: Secondary | ICD-10-CM

## 2011-06-26 DIAGNOSIS — M545 Low back pain, unspecified: Secondary | ICD-10-CM

## 2011-06-26 DIAGNOSIS — I10 Essential (primary) hypertension: Secondary | ICD-10-CM

## 2011-06-26 DIAGNOSIS — R040 Epistaxis: Secondary | ICD-10-CM

## 2011-06-26 LAB — URINALYSIS, ROUTINE W REFLEX MICROSCOPIC
Bilirubin Urine: NEGATIVE
Hgb urine dipstick: NEGATIVE
Ketones, ur: NEGATIVE
Nitrite: NEGATIVE
Specific Gravity, Urine: 1.015 (ref 1.000–1.030)
Total Protein, Urine: NEGATIVE
Urine Glucose: NEGATIVE
Urobilinogen, UA: 0.2 (ref 0.0–1.0)
pH: 6 (ref 5.0–8.0)

## 2011-06-26 LAB — CBC WITH DIFFERENTIAL/PLATELET
Basophils Absolute: 0.1 10*3/uL (ref 0.0–0.1)
Basophils Relative: 0.6 % (ref 0.0–3.0)
Eosinophils Absolute: 0.3 10*3/uL (ref 0.0–0.7)
Eosinophils Relative: 3.4 % (ref 0.0–5.0)
HCT: 33.3 % — ABNORMAL LOW (ref 36.0–46.0)
Hemoglobin: 10.5 g/dL — ABNORMAL LOW (ref 12.0–15.0)
Lymphocytes Relative: 35.5 % (ref 12.0–46.0)
Lymphs Abs: 3.6 10*3/uL (ref 0.7–4.0)
MCHC: 31.5 g/dL (ref 30.0–36.0)
MCV: 85.2 fl (ref 78.0–100.0)
Monocytes Absolute: 1.1 10*3/uL — ABNORMAL HIGH (ref 0.1–1.0)
Monocytes Relative: 10.8 % (ref 3.0–12.0)
Neutro Abs: 5.1 10*3/uL (ref 1.4–7.7)
Neutrophils Relative %: 49.7 % (ref 43.0–77.0)
Platelets: 430 10*3/uL — ABNORMAL HIGH (ref 150.0–400.0)
RBC: 3.9 Mil/uL (ref 3.87–5.11)
RDW: 16.2 % — ABNORMAL HIGH (ref 11.5–14.6)
WBC: 10.3 10*3/uL (ref 4.5–10.5)

## 2011-06-26 MED ORDER — LORAZEPAM 1 MG PO TABS: 1.0000 mg | ORAL_TABLET | Freq: Three times a day (TID) | ORAL | Status: DC | PRN

## 2011-06-26 MED ORDER — PREDNISONE 10 MG PO TABS: ORAL_TABLET | ORAL | Status: DC

## 2011-06-26 MED ORDER — AMOXICILLIN 500 MG PO CAPS: 1000.0000 mg | ORAL_CAPSULE | Freq: Two times a day (BID) | ORAL | Status: AC

## 2011-06-26 NOTE — Assessment & Plan Note (Signed)
Check labs 

## 2011-06-26 NOTE — Assessment & Plan Note (Signed)
4/13 L -- ? Poss sinusitis Empiric amoxicillin

## 2011-06-26 NOTE — Progress Notes (Signed)
Addended by: Cresenciano Lick on: 06/26/2011 04:30 PM   Modules accepted: Orders

## 2011-06-26 NOTE — Assessment & Plan Note (Signed)
Hand OA - inflamatory 03/01/2009: LEFT HAND - COMPLETE 3+ VIEW  Comparison: None  Findings: No evidence of acute fracture dislocation of the left  hand with particular attention directed to the fifth metacarpal.  There is joint space narrowing of the PIP joints of the fourth and  fifth digits and the DIP joint of the third digit. No soft tissue  abnormality.  IMPRESSION:  1. No evidence of acute fracture or dislocation.  2. Osteoarthritis.  Prednisone 10 mg: take 4 tabs a day x 3 days; then 3 tabs a day x 4 days; then 2 tabs a day x 4 days, then 1 tab a day x 6 days, then stop. Take pc.  Potential benefits of a short term steroid  use as well as potential risks  and complications were explained to the patient and were aknowledged.  Rheum cons Dr Brynda Greathouse

## 2011-06-26 NOTE — Assessment & Plan Note (Signed)
2012 [facility]; 12/12 more R radiculopathy type sx's RADIOLOGY REPORT*  Clinical Data: Low back pain extending into the right lower  extremity.  LUMBAR SPINE - 2-3 VIEW  Comparison: Abdomen view 09/18/2005.  Findings: Degenerative disc disease changes at L5-S1 with this  space narrowing and early spurring. Mild facet disease in the  lower lumbar spine. No fracture or malalignment. Aorta is heavily  calcified. SI joints are symmetric and unremarkable.  IMPRESSION:  Mild spondylosis in the lower lumbar spine. No acute findings.  Original Report Authenticated By: Raelyn Number, M.D.      Better

## 2011-06-26 NOTE — Progress Notes (Signed)
Patient ID: [name], female   DOB: 1935-11-06, 76 y.o.   MRN: [id number]  Subjective:    Patient ID: [name], female    DOB: [birth year], 76 y.o.   MRN: [id number]  Hand Pain  Pertinent negatives include no numbness.   C/o B hand pain and finger joint swelling x 1 wk - stiff in am C/o nose bleeds from L - sores x 1 mo off and on C/o R LBP going down R leg - much better  Wt Readings from Last 3 Encounters:  06/26/11 130 lb (58.968 kg)  02/23/11 123 lb (55.792 kg)  01/31/11 126 lb (57.153 kg)   BP Readings from Last 3 Encounters:  06/26/11 138/90  02/23/11 148/78  01/31/11 140/80     Review of Systems  Constitutional: Negative for chills, activity change, appetite change, fatigue and unexpected weight change.  HENT: Positive for nosebleeds. Negative for congestion, mouth sores and sinus pressure.   Eyes: Negative for visual disturbance.  Respiratory: Negative for cough and chest tightness.   Gastrointestinal: Negative for nausea and abdominal pain.  Genitourinary: Negative for frequency, difficulty urinating and vaginal pain.  Musculoskeletal: Positive for joint swelling and arthralgias. Negative for back pain and gait problem.       Hands  Skin: Negative for pallor and rash.  Neurological: Negative for dizziness, tremors, weakness, numbness and headaches.  Psychiatric/Behavioral: Negative for confusion and sleep disturbance.   Past Medical History  Diagnosis Date  . Atrial fibrillation   . Arthritis   . Hypertension   . B12 deficiency   . Depression   . Anxiety   . GERD (gastroesophageal reflux disease)   . Vitamin D deficiency disease   . COPD (chronic obstructive pulmonary disease)   . Pneumonia   . Peptic ulcer, unspecified site, unspecified as acute or chronic, without mention of hemorrhage, perforation, or obstruction   . Osteoarthrosis, hand     both hands   Past Surgical History  Procedure Date  . Bladder surgery     Bladder tack and intestines  .  Splenectomy   . Cystocele repair     reports that she has quit smoking. She has never used smokeless tobacco. She reports that she does not drink alcohol or use illicit drugs. family history includes Arthritis in an unspecified family member; Coronary artery disease in an unspecified family member; Heart disease in her father; and Hypertension in an unspecified family member. Allergies  Allergen Reactions  . Codeine     REACTION: Nausea   Current Outpatient Prescriptions on File Prior to Visit  Medication Sig Dispense Refill  . aspirin 81 MG chewable tablet Chew 81 mg by mouth. Two times daily       . Cholecalciferol ([facility] VITAMIN D3) 1000 UNITS capsule Take 1,000 Units by mouth daily.        . flecainide (TAMBOCOR) 50 MG tablet Take 1 tablet (50 mg total) by mouth 2 (two) times daily.  60 tablet  11  . LORazepam (ATIVAN) 1 MG tablet Take 1 tablet (1 mg total) by mouth every 8 (eight) hours as needed for anxiety. As needed, two times a day  90 tablet  5  . metoprolol tartrate (LOPRESSOR) 25 MG tablet Take 25 mg by mouth. Take 1/2 tablet daily.      Marland Kitchen omeprazole (PRILOSEC OTC) 20 MG tablet Take 20 mg by mouth daily.        . vitamin B-12 (CYANOCOBALAMIN) 1000 MCG tablet Take 1,000 mcg by mouth. Take 1/2  tablet daily        BP 138/90  Pulse 84  Temp(Src) 97.7 F (36.5 C) (Oral)  Resp 16  Wt 130 lb (58.968 kg)      Objective:   Physical Exam  Constitutional: She appears well-developed. No distress.  HENT:  Head: Normocephalic.  Right Ear: External ear normal.  Left Ear: External ear normal.  Nose: Nose normal.  Mouth/Throat: Oropharynx is clear and moist.  Eyes: Conjunctivae are normal. Pupils are equal, round, and reactive to light. Right eye exhibits no discharge. Left eye exhibits no discharge.  Neck: Normal range of motion. Neck supple. No JVD present. No tracheal deviation present. No thyromegaly present.  Cardiovascular: Normal rate, regular rhythm and normal heart sounds.     Pulmonary/Chest: No stridor. No respiratory distress. She has no wheezes.  Abdominal: Soft. Bowel sounds are normal. She exhibits no distension and no mass. There is no tenderness. There is no rebound and no guarding.  Musculoskeletal: She exhibits tenderness. She exhibits no edema.       B DIPs are swollen and tender, palms are tender  Lymphadenopathy:    She has no cervical adenopathy.  Neurological: She displays normal reflexes. No cranial nerve deficit. She exhibits normal muscle tone.  Skin: No rash noted. No erythema.  Psychiatric: She has a normal mood and affect. Her behavior is normal. Judgment and thought content normal.   Lab Results  Component Value Date   WBC 10.4 01/31/2011   HGB 11.9* 01/31/2011   HCT 37.0 01/31/2011   PLT 432.0* 01/31/2011   GLUCOSE 87 01/31/2011   CHOL 225* 01/31/2011   TRIG 102.0 01/31/2011   HDL 72.30 01/31/2011   LDLDIRECT 144.3 01/31/2011   LDLCALC  Value: 154        Total Cholesterol/HDL:CHD Risk Coronary Heart Disease Risk Table                     Men   Women  1/2 Average Risk   3.4   3.3* 11/29/2007   ALT 14 01/31/2011   AST 23 01/31/2011   NA 143 01/31/2011   K 5.4* 01/31/2011   CL 108 01/31/2011   CREATININE 0.9 01/31/2011   BUN 20 01/31/2011   CO2 27 01/31/2011   TSH 2.02 01/31/2011   INR 0.9 08/13/2008    03/01/09 LEFT HAND - COMPLETE 3+ VIEW  Comparison: None  Findings: No evidence of acute fracture dislocation of the left  hand with particular attention directed to the fifth metacarpal.  There is joint space narrowing of the PIP joints of the fourth and  fifth digits and the DIP joint of the third digit. No soft tissue  abnormality.  IMPRESSION:  1. No evidence of acute fracture or dislocation.  2. Osteoarthritis.      Assessment & Plan:

## 2011-06-26 NOTE — Assessment & Plan Note (Signed)
Continue with current prescription therapy as reflected on the Med list.  

## 2011-06-27 LAB — BASIC METABOLIC PANEL
BUN: 22 mg/dL (ref 6–23)
CO2: 24 mEq/L (ref 19–32)
Calcium: 8.9 mg/dL (ref 8.4–10.5)
Chloride: 107 mEq/L (ref 96–112)
Creatinine, Ser: 0.9 mg/dL (ref 0.4–1.2)
GFR: 65.99 mL/min (ref >60.00)
Glucose, Bld: 69 mg/dL — ABNORMAL LOW (ref 70–99)
Potassium: 4.7 mEq/L (ref 3.5–5.1)
Sodium: 141 mEq/L (ref 135–145)

## 2011-06-27 LAB — HEPATIC FUNCTION PANEL
ALT: 14 U/L (ref 0–35)
AST: 20 U/L (ref 0–37)
Albumin: 3.8 g/dL (ref 3.5–5.2)
Alkaline Phosphatase: 68 U/L (ref 39–117)
Bilirubin, Direct: 0.1 mg/dL (ref 0.0–0.3)
Total Bilirubin: 0.1 mg/dL — ABNORMAL LOW (ref 0.3–1.2)
Total Protein: 6.8 g/dL (ref 6.0–8.3)

## 2011-06-27 LAB — RHEUMATOID FACTOR: Rhuematoid fact SerPl-aCnc: <10 IU/mL (ref <=14)

## 2011-06-27 LAB — SEDIMENTATION RATE: Sed Rate: 18 mm/hr (ref 0–22)

## 2011-06-27 LAB — VITAMIN B12: Vitamin B-12: 1064 pg/mL — ABNORMAL HIGH (ref 211–911)

## 2011-06-27 LAB — TSH: TSH: 1.66 u[IU]/mL (ref 0.35–5.50)

## 2011-08-17 ENCOUNTER — Ambulatory Visit (INDEPENDENT_AMBULATORY_CARE_PROVIDER_SITE_OTHER)
Admission: RE | Admit: 2011-08-17 | Discharge: 2011-08-17 | Disposition: A | Discharge status: Home / Self Care | Payer: Medicare Other | Source: Ambulatory Visit | Attending: Internal Medicine | Admitting: Internal Medicine

## 2011-08-17 ENCOUNTER — Encounter: Payer: Self-pay | Admitting: Internal Medicine

## 2011-08-17 ENCOUNTER — Ambulatory Visit (INDEPENDENT_AMBULATORY_CARE_PROVIDER_SITE_OTHER): Payer: Medicare Other | Admitting: Internal Medicine

## 2011-08-17 VITALS — BP 130/64 | HR 74 | Temp 98.4°F | Resp 16

## 2011-08-17 DIAGNOSIS — J329 Chronic sinusitis, unspecified: Secondary | ICD-10-CM

## 2011-08-17 DIAGNOSIS — J029 Acute pharyngitis, unspecified: Secondary | ICD-10-CM

## 2011-08-17 DIAGNOSIS — R040 Epistaxis: Secondary | ICD-10-CM

## 2011-08-17 MED ORDER — AMOXICILLIN 875 MG PO TABS: 875.0000 mg | ORAL_TABLET | Freq: Two times a day (BID) | ORAL | Status: DC

## 2011-08-17 NOTE — Progress Notes (Signed)
Subjective:    Patient ID: [name], female    DOB: 17-Aug-1935, 76 y.o.   MRN: [id number]  Sore Throat  This is a new problem. The current episode started in the past 7 days. The problem has been unchanged. Neither side of throat is experiencing more pain than the other. There has been no fever. The fever has been present for less than 1 day. The pain is at a severity of 0/10. The patient is experiencing no pain. Associated symptoms include congestion. Pertinent negatives include no abdominal pain, coughing, diarrhea, drooling, ear discharge, ear pain, plugged ear sensation, neck pain, shortness of breath, stridor, trouble swallowing or vomiting.      Review of Systems  Constitutional: Negative for fever, chills, diaphoresis, activity change, appetite change, fatigue and unexpected weight change.  HENT: Positive for nosebleeds, congestion, sore throat, rhinorrhea and postnasal drip. Negative for hearing loss, ear pain, facial swelling, sneezing, drooling, mouth sores, trouble swallowing, neck pain, neck stiffness, dental problem, voice change, sinus pressure, tinnitus and ear discharge.   Eyes: Negative.   Respiratory: Negative for cough, chest tightness, shortness of breath, wheezing and stridor.   Cardiovascular: Negative for chest pain and palpitations.  Gastrointestinal: Negative for nausea, vomiting, abdominal pain, diarrhea, constipation, blood in stool and anal bleeding.  Genitourinary: Negative.   Musculoskeletal: Negative for myalgias, back pain, joint swelling, arthralgias and gait problem.  Skin: Negative for color change, pallor, rash and wound.  Neurological: Negative.   Hematological: Negative for adenopathy. Does not bruise/bleed easily.  Psychiatric/Behavioral: Negative.        Objective:   Physical Exam  Vitals reviewed. Constitutional: She is oriented to person, place, and time. She appears well-developed and well-nourished. No distress.  HENT:  Head: Normocephalic  and atraumatic. No trismus in the jaw.  Right Ear: Hearing, tympanic membrane, external ear and ear canal normal.  Left Ear: Hearing, tympanic membrane, external ear and ear canal normal.  Nose: Mucosal edema and rhinorrhea present. No nose lacerations, sinus tenderness, nasal deformity, septal deviation or nasal septal hematoma. No epistaxis.  No foreign bodies. Right sinus exhibits maxillary sinus tenderness. Right sinus exhibits no frontal sinus tenderness. Left sinus exhibits maxillary sinus tenderness. Left sinus exhibits no frontal sinus tenderness.  Mouth/Throat: Mucous membranes are normal. Mucous membranes are not pale, not dry and not cyanotic. No uvula swelling. Posterior oropharyngeal erythema present. No oropharyngeal exudate, posterior oropharyngeal edema or tonsillar abscesses.  Eyes: Conjunctivae are normal. Right eye exhibits no discharge. Left eye exhibits no discharge. No scleral icterus.  Neck: Normal range of motion. Neck supple. No JVD present. No tracheal deviation present. No thyromegaly present.  Cardiovascular: Normal rate, regular rhythm, normal heart sounds and intact distal pulses.  Exam reveals no gallop and no friction rub.   No murmur heard. Pulmonary/Chest: Effort normal and breath sounds normal. No stridor. No respiratory distress. She has no wheezes. She has no rales. She exhibits no tenderness.  Abdominal: Soft. Bowel sounds are normal. She exhibits no distension and no mass. There is no tenderness. There is no rebound and no guarding.  Musculoskeletal: Normal range of motion. She exhibits no edema and no tenderness.  Lymphadenopathy:    She has no cervical adenopathy.  Neurological: She is oriented to person, place, and time.  Skin: Skin is warm and dry. No rash noted. She is not diaphoretic. No erythema. No pallor.  Psychiatric: She has a normal mood and affect. Her behavior is normal. Judgment and thought content normal.  Assessment & Plan:

## 2011-08-17 NOTE — Assessment & Plan Note (Signed)
Her plain films are unremarkable, I will treat her for bacterial sinusitis

## 2011-08-17 NOTE — Assessment & Plan Note (Signed)
Start amoxil for the infection 

## 2011-08-17 NOTE — Patient Instructions (Signed)

## 2011-08-17 NOTE — Assessment & Plan Note (Signed)
She is a previous smoker so her risk of nasopharyngeal CA is significant so I have asked her to see ENT

## 2011-08-18 ENCOUNTER — Telehealth: Payer: Self-pay | Admitting: *Deleted

## 2011-08-18 MED ORDER — AMOXICILLIN 250 MG/5ML PO SUSR: 500.0000 mg | Freq: Three times a day (TID) | ORAL | Status: AC

## 2011-08-18 NOTE — Telephone Encounter (Signed)
Left msg on triage stating saw Dr. Ronnald Ramp yesterday & was rx antibiotic for strep throat. Pt states she is not able to swallow the pill its so big. Requesting md to change to something else & [facility].Marland KitchenMarland Kitchen6/14/13@8 ;59am/LMB

## 2011-08-18 NOTE — Telephone Encounter (Signed)
Called pt no answer LMOM md [facility] liquid [facility]... 08/18/11@10 :11am/LMB

## 2011-08-18 NOTE — Telephone Encounter (Signed)
done

## 2011-08-22 ENCOUNTER — Telehealth: Payer: Self-pay | Admitting: Internal Medicine

## 2011-08-22 NOTE — Telephone Encounter (Signed)
I advised pt to hold amoxicillin until I get further advisement from MD. Dr. Ronnald Ramp gave her amoxicillin for strep throat and URI. She cant take it. Dr Ronnald Ramp also referred her to ENT. She is seeing them next week. Does she need something else in its place? Please advise.

## 2011-08-22 NOTE — Telephone Encounter (Signed)
Called patient after reviewing record. 1. Keep appointment w/ ENT 2. No need for antibiotic 3. Hydrate 4. Take mucinex 1200 mg twice a day.

## 2011-08-22 NOTE — Telephone Encounter (Signed)
Caller: Rania/Patient; PCP: Walker Kehr; CB#: [phone];  Call regarding Mouth & Tongue is Sore. Concerned about Possible Reaction To Amoxicillin.; Onset: 08/21/11.  Afebrile.  Started Amoxicillin for "strep" throat and sinus infection.  Hx of Amox causing sore mouth in past.  Denies canker sores or thrush.  Reports ongoing/chronic sensation of throat is closing up "that is the phlem that I can't get up" ; hx of esophageal stricture.  Advised to see MD within 24 hrs for persistent or recurrent symptoms that do not respond to treatment per Severe Allergic Reaction Guideline.  No appts remain in Epic for 08/22/11 or 08/23/11.  Info noted and sent to MD and [facility] pool for call back to pt.

## 2011-08-24 ENCOUNTER — Other Ambulatory Visit: Payer: Self-pay | Admitting: *Deleted

## 2011-08-24 NOTE — Telephone Encounter (Signed)
Pt states that she received rx for Amoxicillin last week that caused sores in her mouth-pt would like an rx for Magic Mouthwash-AVP pt, please advise.

## 2011-08-24 NOTE — Telephone Encounter (Signed)
Ok for magic mouthwash with lidocain 3:1 180 cc, swish and spit prn. Refill x 1

## 2011-08-28 MED ORDER — MAGIC MOUTHWASH W/LIDOCAINE: ORAL | Status: DC

## 2011-08-28 NOTE — Telephone Encounter (Signed)
Done

## 2011-09-05 ENCOUNTER — Encounter: Payer: Self-pay | Admitting: Internal Medicine

## 2011-09-05 ENCOUNTER — Ambulatory Visit: Payer: Medicare Other | Admitting: Internal Medicine

## 2011-09-05 ENCOUNTER — Ambulatory Visit (INDEPENDENT_AMBULATORY_CARE_PROVIDER_SITE_OTHER): Payer: Medicare Other | Admitting: Internal Medicine

## 2011-09-05 VITALS — BP 160/70 | HR 84 | Temp 96.9°F | Resp 16 | Wt 127.0 lb

## 2011-09-05 DIAGNOSIS — M545 Low back pain, unspecified: Secondary | ICD-10-CM

## 2011-09-05 DIAGNOSIS — M129 Arthropathy, unspecified: Secondary | ICD-10-CM

## 2011-09-05 DIAGNOSIS — R040 Epistaxis: Secondary | ICD-10-CM

## 2011-09-05 DIAGNOSIS — M19049 Primary osteoarthritis, unspecified hand: Secondary | ICD-10-CM

## 2011-09-05 DIAGNOSIS — I10 Essential (primary) hypertension: Secondary | ICD-10-CM

## 2011-09-05 DIAGNOSIS — E538 Deficiency of other specified B group vitamins: Secondary | ICD-10-CM

## 2011-09-05 DIAGNOSIS — K219 Gastro-esophageal reflux disease without esophagitis: Secondary | ICD-10-CM

## 2011-09-05 DIAGNOSIS — J029 Acute pharyngitis, unspecified: Secondary | ICD-10-CM

## 2011-09-05 MED ORDER — NYSTATIN 100000 UNIT/ML MT SUSP: 500000.0000 [IU] | Freq: Four times a day (QID) | OROMUCOSAL | Status: AC

## 2011-09-05 MED ORDER — PANTOPRAZOLE SODIUM 40 MG PO TBEC: 40.0000 mg | DELAYED_RELEASE_TABLET | Freq: Every day | ORAL | Status: DC

## 2011-09-05 MED ORDER — AZITHROMYCIN 250 MG PO TABS: ORAL_TABLET | ORAL | Status: AC

## 2011-09-05 MED ORDER — DICLOFENAC SODIUM 1 % TD GEL: 1.0000 "application " | Freq: Four times a day (QID) | TRANSDERMAL | Status: DC

## 2011-09-05 NOTE — Assessment & Plan Note (Signed)
Will switch to Protonix

## 2011-09-05 NOTE — Assessment & Plan Note (Signed)
Continue with current prescription therapy as reflected on the Med list.  

## 2011-09-05 NOTE — Assessment & Plan Note (Signed)
Zpac Nystatin po if thrush

## 2011-09-05 NOTE — Progress Notes (Signed)
Subjective:    Patient ID: [name], female    DOB: [birth year], 76 y.o.   MRN: [id number]  HPI C/o B hand pain and finger joint swelling x  wks - stiff in am - she saw Dr Charlestine Night C/o nose bleeds from L - sores x 1 mo off and on C/o ST F/u HTN, COPD F/u R LBP going down R leg - much better  Wt Readings from Last 3 Encounters:  09/05/11 127 lb (57.607 kg)  06/26/11 130 lb (58.968 kg)  02/23/11 123 lb (55.792 kg)   BP Readings from Last 3 Encounters:  09/05/11 160/70  08/17/11 130/64  06/26/11 138/90     Review of Systems  Constitutional: Negative for chills, activity change, appetite change, fatigue and unexpected weight change.  HENT: Positive for nosebleeds. Negative for congestion, mouth sores and sinus pressure.   Eyes: Negative for visual disturbance.  Respiratory: Negative for cough and chest tightness.   Gastrointestinal: Negative for nausea and abdominal pain.  Genitourinary: Negative for frequency, difficulty urinating and vaginal pain.  Musculoskeletal: Positive for joint swelling and arthralgias. Negative for back pain and gait problem.       Hands  Skin: Negative for pallor and rash.  Neurological: Negative for dizziness, tremors, weakness and headaches.  Psychiatric/Behavioral: Negative for confusion and disturbed wake/sleep cycle.   Past Medical History  Diagnosis Date  . Atrial fibrillation   . Arthritis   . Hypertension   . B12 deficiency   . Depression   . Anxiety   . GERD (gastroesophageal reflux disease)   . Vitamin D deficiency disease   . COPD (chronic obstructive pulmonary disease)   . Pneumonia   . Peptic ulcer, unspecified site, unspecified as acute or chronic, without mention of hemorrhage, perforation, or obstruction   . Osteoarthrosis, hand     both hands   Past Surgical History  Procedure Date  . Bladder surgery     Bladder tack and intestines  . Splenectomy   . Cystocele repair     reports that she has quit smoking. She has  never used smokeless tobacco. She reports that she does not drink alcohol or use illicit drugs. family history includes Arthritis in an unspecified family member; Coronary artery disease in an unspecified family member; Heart disease in her father; and Hypertension in an unspecified family member. Allergies  Allergen Reactions  . Amoxicillin   . Codeine     REACTION: Nausea   Current Outpatient Prescriptions on File Prior to Visit  Medication Sig Dispense Refill  . Alum & Mag Hydroxide-Simeth (MAGIC MOUTHWASH W/LIDOCAINE) SOLN Swish and Spit as needed.  180 mL  0  . aspirin 81 MG chewable tablet Chew 81 mg by mouth. Two times daily       . Cholecalciferol ([employer] VITAMIN D3) 1000 UNITS capsule Take 1,000 Units by mouth daily.        . flecainide (TAMBOCOR) 50 MG tablet Take 1 tablet (50 mg total) by mouth 2 (two) times daily.  60 tablet  11  . LORazepam (ATIVAN) 1 MG tablet Take 1 tablet (1 mg total) by mouth every 8 (eight) hours as needed for anxiety. As needed, two times a day  90 tablet  5  . metoprolol tartrate (LOPRESSOR) 25 MG tablet Take 25 mg by mouth. Take 1/2 tablet daily.      . vitamin B-12 (CYANOCOBALAMIN) 1000 MCG tablet Take 1,000 mcg by mouth. Take 1/2 tablet daily       . pantoprazole (  PROTONIX) 40 MG tablet Take 1 tablet (40 mg total) by mouth daily.  30 tablet  11   BP 160/70  Pulse 84  Temp 96.9 F (36.1 C) (Oral)  Resp 16  Wt 127 lb (57.607 kg)      Objective:   Physical Exam  Constitutional: She appears well-developed. No distress.  HENT:  Head: Normocephalic.  Right Ear: External ear normal.  Left Ear: External ear normal.  Nose: Nose normal.  Mouth/Throat: Oropharynx is clear and moist.  Eyes: Conjunctivae are normal. Pupils are equal, round, and reactive to light. Right eye exhibits no discharge. Left eye exhibits no discharge.  Neck: Normal range of motion. Neck supple. No JVD present. No tracheal deviation present. No thyromegaly present.    Cardiovascular: Normal rate, regular rhythm and normal heart sounds.   Pulmonary/Chest: No stridor. No respiratory distress. She has no wheezes.  Abdominal: Soft. Bowel sounds are normal. She exhibits no distension and no mass. There is no tenderness. There is no rebound and no guarding.  Musculoskeletal: She exhibits tenderness. She exhibits no edema.       B DIPs are swollen and tender, palms are tender  Lymphadenopathy:    She has no cervical adenopathy.  Neurological: She displays normal reflexes. No cranial nerve deficit. She exhibits normal muscle tone.  Skin: No rash noted. No erythema.  Psychiatric: She has a normal mood and affect. Her behavior is normal. Judgment and thought content normal.  Eryth throat  Lab Results  Component Value Date   WBC 10.3 06/26/2011   HGB 10.5* 06/26/2011   HCT 33.3* 06/26/2011   PLT 430.0* 06/26/2011   GLUCOSE 69* 06/26/2011   CHOL 225* 01/31/2011   TRIG 102.0 01/31/2011   HDL 72.30 01/31/2011   LDLDIRECT 144.3 01/31/2011   LDLCALC  Value: 154        Total Cholesterol/HDL:CHD Risk Coronary Heart Disease Risk Table                     Men   Women  1/2 Average Risk   3.4   3.3* 11/29/2007   ALT 14 06/26/2011   AST 20 06/26/2011   NA 141 06/26/2011   K 4.7 06/26/2011   CL 107 06/26/2011   CREATININE 0.9 06/26/2011   BUN 22 06/26/2011   CO2 24 06/26/2011   TSH 1.66 06/26/2011   INR 0.9 08/13/2008    03/01/09 LEFT HAND - COMPLETE 3+ VIEW  Comparison: None  Findings: No evidence of acute fracture dislocation of the left  hand with particular attention directed to the fifth metacarpal.  There is joint space narrowing of the PIP joints of the fourth and  fifth digits and the DIP joint of the third digit. No soft tissue  abnormality.  IMPRESSION:  1. No evidence of acute fracture or dislocation.  2. Osteoarthritis.      Assessment & Plan:

## 2011-09-05 NOTE — Assessment & Plan Note (Signed)
ENT if reoccurs

## 2011-09-11 ENCOUNTER — Other Ambulatory Visit: Payer: Self-pay | Admitting: *Deleted

## 2011-09-11 MED ORDER — METOPROLOL TARTRATE 25 MG PO TABS: 12.5000 mg | ORAL_TABLET | Freq: Two times a day (BID) | ORAL | Status: DC

## 2011-09-11 MED ORDER — FLECAINIDE ACETATE 50 MG PO TABS: 50.0000 mg | ORAL_TABLET | Freq: Two times a day (BID) | ORAL | Status: DC

## 2011-11-01 ENCOUNTER — Other Ambulatory Visit: Payer: Self-pay | Admitting: Cardiology

## 2011-12-12 ENCOUNTER — Other Ambulatory Visit (INDEPENDENT_AMBULATORY_CARE_PROVIDER_SITE_OTHER): Payer: Medicare Other

## 2011-12-12 ENCOUNTER — Encounter: Payer: Self-pay | Admitting: Internal Medicine

## 2011-12-12 ENCOUNTER — Ambulatory Visit (INDEPENDENT_AMBULATORY_CARE_PROVIDER_SITE_OTHER): Payer: Medicare Other | Admitting: Internal Medicine

## 2011-12-12 VITALS — BP 128/60 | HR 76 | Temp 97.4°F | Resp 16 | Wt 126.0 lb

## 2011-12-12 DIAGNOSIS — R002 Palpitations: Secondary | ICD-10-CM

## 2011-12-12 DIAGNOSIS — F329 Major depressive disorder, single episode, unspecified: Secondary | ICD-10-CM

## 2011-12-12 DIAGNOSIS — J449 Chronic obstructive pulmonary disease, unspecified: Secondary | ICD-10-CM

## 2011-12-12 DIAGNOSIS — R634 Abnormal weight loss: Secondary | ICD-10-CM

## 2011-12-12 DIAGNOSIS — I10 Essential (primary) hypertension: Secondary | ICD-10-CM

## 2011-12-12 DIAGNOSIS — M545 Low back pain, unspecified: Secondary | ICD-10-CM

## 2011-12-12 DIAGNOSIS — E538 Deficiency of other specified B group vitamins: Secondary | ICD-10-CM

## 2011-12-12 LAB — BASIC METABOLIC PANEL
BUN: 21 mg/dL (ref 6–23)
CO2: 22 mEq/L (ref 19–32)
Calcium: 8.7 mg/dL (ref 8.4–10.5)
Chloride: 110 mEq/L (ref 96–112)
Creatinine, Ser: 1.1 mg/dL (ref 0.4–1.2)
GFR: 53.87 mL/min — ABNORMAL LOW (ref >60.00)
Glucose, Bld: 84 mg/dL (ref 70–99)
Potassium: 4.1 mEq/L (ref 3.5–5.1)
Sodium: 140 mEq/L (ref 135–145)

## 2011-12-12 LAB — CBC WITH DIFFERENTIAL/PLATELET
Basophils Absolute: 0 10*3/uL (ref 0.0–0.1)
Basophils Relative: 0 % (ref 0.0–3.0)
Eosinophils Absolute: 0.3 10*3/uL (ref 0.0–0.7)
Eosinophils Relative: 3 % (ref 0.0–5.0)
HCT: 32.9 % — ABNORMAL LOW (ref 36.0–46.0)
Hemoglobin: 10.3 g/dL — ABNORMAL LOW (ref 12.0–15.0)
Lymphocytes Relative: 26.2 % (ref 12.0–46.0)
Lymphs Abs: 2.8 10*3/uL (ref 0.7–4.0)
MCHC: 31.4 g/dL (ref 30.0–36.0)
MCV: 84.4 fl (ref 78.0–100.0)
Monocytes Absolute: 0.9 10*3/uL (ref 0.1–1.0)
Monocytes Relative: 8.5 % (ref 3.0–12.0)
Neutro Abs: 6.7 10*3/uL (ref 1.4–7.7)
Neutrophils Relative %: 62.3 % (ref 43.0–77.0)
Platelets: 427 10*3/uL — ABNORMAL HIGH (ref 150.0–400.0)
RBC: 3.89 Mil/uL (ref 3.87–5.11)
RDW: 19 % — ABNORMAL HIGH (ref 11.5–14.6)
WBC: 10.8 10*3/uL — ABNORMAL HIGH (ref 4.5–10.5)

## 2011-12-12 NOTE — Assessment & Plan Note (Signed)
Continue with current prescription therapy as reflected on the Med list.  

## 2011-12-12 NOTE — Progress Notes (Signed)
Subjective:    Patient ID: [name], female    DOB: [birth year], 76 y.o.   MRN: [id number]  HPI F/u B hand pain and finger joint swelling x  wks - stiff in am - she saw Dr Charlestine Night F/u HTN, COPD F/u R LBP going down R leg - much better  Wt Readings from Last 3 Encounters:  12/12/11 126 lb (57.153 kg)  09/05/11 127 lb (57.607 kg)  06/26/11 130 lb (58.968 kg)   BP Readings from Last 3 Encounters:  12/12/11 128/60  09/05/11 160/70  08/17/11 130/64     Review of Systems  Constitutional: Negative for chills, activity change, appetite change, fatigue and unexpected weight change.  HENT: Positive for nosebleeds. Negative for congestion, mouth sores and sinus pressure.   Eyes: Negative for visual disturbance.  Respiratory: Negative for cough and chest tightness.   Gastrointestinal: Negative for nausea and abdominal pain.  Genitourinary: Negative for frequency, difficulty urinating and vaginal pain.  Musculoskeletal: Positive for joint swelling and arthralgias. Negative for back pain and gait problem.       Hands  Skin: Negative for pallor and rash.  Neurological: Negative for dizziness, tremors, weakness and headaches.  Psychiatric/Behavioral: Negative for confusion and disturbed wake/sleep cycle.   Past Medical History  Diagnosis Date  . Atrial fibrillation   . Arthritis   . Hypertension   . B12 deficiency   . Depression   . Anxiety   . GERD (gastroesophageal reflux disease)   . Vitamin D deficiency disease   . COPD (chronic obstructive pulmonary disease)   . Pneumonia   . Peptic ulcer, unspecified site, unspecified as acute or chronic, without mention of hemorrhage, perforation, or obstruction   . Osteoarthrosis, hand     both hands   Past Surgical History  Procedure Date  . Bladder surgery     Bladder tack and intestines  . Splenectomy   . Cystocele repair     reports that she has quit smoking. She has never used smokeless tobacco. She reports that she does not  drink alcohol or use illicit drugs. family history includes Arthritis in an unspecified family member; Coronary artery disease in an unspecified family member; Heart disease in her father; and Hypertension in an unspecified family member. Allergies  Allergen Reactions  . Amoxicillin   . Codeine     REACTION: Nausea   Current Outpatient Prescriptions on File Prior to Visit  Medication Sig Dispense Refill  . aspirin 81 MG chewable tablet Chew 81 mg by mouth. Two times daily       . Cholecalciferol ([employer] VITAMIN D3) 1000 UNITS capsule Take 1,000 Units by mouth daily.        . flecainide (TAMBOCOR) 50 MG tablet TAKE 1 TABLET BY MOUTH TWICE A DAY  60 tablet  4  . LORazepam (ATIVAN) 1 MG tablet Take 1 tablet (1 mg total) by mouth every 8 (eight) hours as needed for anxiety. As needed, two times a day  90 tablet  5  . metoprolol tartrate (LOPRESSOR) 25 MG tablet Take 0.5 tablets (12.5 mg total) by mouth 2 (two) times daily.  90 tablet  1  . omeprazole (PRILOSEC) 20 MG capsule Take 20 mg by mouth daily.      . vitamin B-12 (CYANOCOBALAMIN) 1000 MCG tablet Take 1,000 mcg by mouth. Take 1/2 tablet daily       . Alum & Mag Hydroxide-Simeth (MAGIC MOUTHWASH W/LIDOCAINE) SOLN Swish and Spit as needed.  180 mL  0  .  diclofenac sodium (VOLTAREN) 1 % GEL Apply 1 application topically 4 (four) times daily.  200 g  3  . pantoprazole (PROTONIX) 40 MG tablet Take 1 tablet (40 mg total) by mouth daily.  30 tablet  11   BP 128/60  Pulse 76  Temp 97.4 F (36.3 C) (Oral)  Resp 16  Wt 126 lb (57.153 kg)      Objective:   Physical Exam  Constitutional: She appears well-developed. No distress.  HENT:  Head: Normocephalic.  Right Ear: External ear normal.  Left Ear: External ear normal.  Nose: Nose normal.  Mouth/Throat: Oropharynx is clear and moist.  Eyes: Conjunctivae normal are normal. Pupils are equal, round, and reactive to light. Right eye exhibits no discharge. Left eye exhibits no discharge.    Neck: Normal range of motion. Neck supple. No JVD present. No tracheal deviation present. No thyromegaly present.  Cardiovascular: Normal rate, regular rhythm and normal heart sounds.   Pulmonary/Chest: No stridor. No respiratory distress. She has no wheezes.  Abdominal: Soft. Bowel sounds are normal. She exhibits no distension and no mass. There is no tenderness. There is no rebound and no guarding.  Musculoskeletal: She exhibits tenderness. She exhibits no edema.       B DIPs are less swollen and tender, palms are tender  Lymphadenopathy:    She has no cervical adenopathy.  Neurological: She displays normal reflexes. No cranial nerve deficit. She exhibits normal muscle tone.  Skin: No rash noted. No erythema.  Psychiatric: She has a normal mood and affect. Her behavior is normal. Judgment and thought content normal.  Eryth throat  Lab Results  Component Value Date   WBC 10.3 06/26/2011   HGB 10.5* 06/26/2011   HCT 33.3* 06/26/2011   PLT 430.0* 06/26/2011   GLUCOSE 69* 06/26/2011   CHOL 225* 01/31/2011   TRIG 102.0 01/31/2011   HDL 72.30 01/31/2011   LDLDIRECT 144.3 01/31/2011   LDLCALC  Value: 154        Total Cholesterol/HDL:CHD Risk Coronary Heart Disease Risk Table                     Men   Women  1/2 Average Risk   3.4   3.3* 11/29/2007   ALT 14 06/26/2011   AST 20 06/26/2011   NA 141 06/26/2011   K 4.7 06/26/2011   CL 107 06/26/2011   CREATININE 0.9 06/26/2011   BUN 22 06/26/2011   CO2 24 06/26/2011   TSH 1.66 06/26/2011   INR 0.9 08/13/2008    03/01/09 LEFT HAND - COMPLETE 3+ VIEW  Comparison: None  Findings: No evidence of acute fracture dislocation of the left  hand with particular attention directed to the fifth metacarpal.  There is joint space narrowing of the PIP joints of the fourth and  fifth digits and the DIP joint of the third digit. No soft tissue  abnormality.  IMPRESSION:  1. No evidence of acute fracture or dislocation.  2. Osteoarthritis.      Assessment &  Plan:

## 2011-12-12 NOTE — Assessment & Plan Note (Signed)
No change 

## 2011-12-12 NOTE — Assessment & Plan Note (Signed)
Wt Readings from Last 3 Encounters:  12/12/11 126 lb (57.153 kg)  09/05/11 127 lb (57.607 kg)  06/26/11 130 lb (58.968 kg)

## 2011-12-27 ENCOUNTER — Other Ambulatory Visit: Payer: Self-pay | Admitting: Internal Medicine

## 2011-12-28 NOTE — Telephone Encounter (Signed)
Ok to Rf? 

## 2012-01-04 NOTE — Telephone Encounter (Signed)
Ok per Dr. Alain Marion. Done

## 2012-02-08 ENCOUNTER — Ambulatory Visit (INDEPENDENT_AMBULATORY_CARE_PROVIDER_SITE_OTHER): Payer: Medicare Other | Admitting: Cardiology

## 2012-02-08 ENCOUNTER — Encounter: Payer: Self-pay | Admitting: Cardiology

## 2012-02-08 VITALS — BP 126/68 | HR 67 | Ht 65.0 in | Wt 124.0 lb

## 2012-02-08 DIAGNOSIS — R002 Palpitations: Secondary | ICD-10-CM

## 2012-02-08 DIAGNOSIS — I4891 Unspecified atrial fibrillation: Secondary | ICD-10-CM

## 2012-02-08 DIAGNOSIS — I10 Essential (primary) hypertension: Secondary | ICD-10-CM

## 2012-02-08 NOTE — Assessment & Plan Note (Signed)
Blood pressure controlled. Continue present medications. 

## 2012-02-08 NOTE — Patient Instructions (Addendum)
Your physician wants you to follow-up in: ONE YEAR WITH DR CRENSHAW You will receive a reminder letter in the mail two months in advance. If you don't receive a letter, please call our office to schedule the follow-up appointment.  

## 2012-02-08 NOTE — Assessment & Plan Note (Signed)
Patient remains in sinus rhythm. Continue flecainide, beta blocker and aspirin. Patient declines any anticoagulation other than aspirin and understands the increased risk of CVA.

## 2012-02-08 NOTE — Progress Notes (Signed)
   HPI: Pleasant female for fu of paroxysmal atrial fibrillation. We did perform a Myoview on 01/21/08 that showed normal perfusion and an ejection fraction of 73%. Her last echocardiogram in Feb 2012 showed normal LV function, mild biatrial enlargement, mild MR and trace AI. A previous monitor revealed paroxysmal atrial fibrillation/flutter with a rapid ventricular response. Note she refuses Coumadin. A CardioNet was performed in Feb 2012 secondary to palpitations and revealed sinus rhythm with PACs and rare PVCs. I last saw her in Oct of 2012. Since then there is no dyspnea, chest pain or syncope. She occasionally feels brief skips but no sustained palpitations.   Current Outpatient Prescriptions  Medication Sig Dispense Refill  . aspirin 81 MG chewable tablet Chew 81 mg by mouth. Two times daily       . Cholecalciferol ([employer] VITAMIN D3) 1000 UNITS capsule Take 1,000 Units by mouth daily.        . flecainide (TAMBOCOR) 50 MG tablet TAKE 1 TABLET BY MOUTH TWICE A DAY  60 tablet  4  . LORazepam (ATIVAN) 1 MG tablet TAKE 1 TABLET EVERY 8 HOURS AS NEEDED FOR ANXIETY  90 tablet  5  . metoprolol tartrate (LOPRESSOR) 25 MG tablet Take 0.5 tablets (12.5 mg total) by mouth 2 (two) times daily.  90 tablet  1  . omeprazole (PRILOSEC) 20 MG capsule Take 20 mg by mouth daily.      . vitamin B-12 (CYANOCOBALAMIN) 1000 MCG tablet Take 1,000 mcg by mouth. Take 1/2 tablet daily          Past Medical History  Diagnosis Date  . Atrial fibrillation   . Arthritis   . Hypertension   . B12 deficiency   . Depression   . Anxiety   . GERD (gastroesophageal reflux disease)   . Vitamin D deficiency disease   . COPD (chronic obstructive pulmonary disease)   . Pneumonia   . Peptic ulcer, unspecified site, unspecified as acute or chronic, without mention of hemorrhage, perforation, or obstruction   . Osteoarthrosis, hand     both hands    Past Surgical History  Procedure Date  . Bladder surgery     Bladder  tack and intestines  . Splenectomy   . Cystocele repair     History   Social History  . Marital Status: Divorced    Spouse Name: N/A    Number of Children: N/A  . Years of Education: N/A   Occupational History  . Not on file.   Social History Main Topics  . Smoking status: Former Research scientist (life sciences)  . Smokeless tobacco: Never Used  . Alcohol Use: No  . Drug Use: No  . Sexually Active: Not Currently   Other Topics Concern  . 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 Remaining systems are negative.  Physical Exam: Well-developed well-nourished in no acute distress.  Skin is warm and dry.  HEENT is normal.  Neck is supple.  Chest is clear to auscultation with normal expansion.  Cardiovascular exam is regular rate and rhythm.  Abdominal exam nontender or distended. No masses palpated. Extremities show no edema. neuro grossly intact  ECG sinus rhythm with first degree AV block. Occasional PACs. No ST changes.

## 2012-03-15 ENCOUNTER — Ambulatory Visit (INDEPENDENT_AMBULATORY_CARE_PROVIDER_SITE_OTHER): Payer: Medicare Other | Admitting: Internal Medicine

## 2012-03-15 ENCOUNTER — Encounter: Payer: Self-pay | Admitting: Internal Medicine

## 2012-03-15 VITALS — BP 120/68 | HR 65 | Temp 97.9°F | Resp 12 | Wt 122.1 lb

## 2012-03-15 DIAGNOSIS — J019 Acute sinusitis, unspecified: Secondary | ICD-10-CM

## 2012-03-15 DIAGNOSIS — E538 Deficiency of other specified B group vitamins: Secondary | ICD-10-CM

## 2012-03-15 DIAGNOSIS — I10 Essential (primary) hypertension: Secondary | ICD-10-CM

## 2012-03-15 DIAGNOSIS — J449 Chronic obstructive pulmonary disease, unspecified: Secondary | ICD-10-CM

## 2012-03-15 DIAGNOSIS — J029 Acute pharyngitis, unspecified: Secondary | ICD-10-CM

## 2012-03-15 MED ORDER — AZITHROMYCIN 250 MG PO TABS: ORAL_TABLET | ORAL | Status: DC

## 2012-03-15 NOTE — Assessment & Plan Note (Signed)
Continue with current prescription therapy as reflected on the Med list.  

## 2012-03-15 NOTE — Progress Notes (Signed)
Subjective:    Sore Throat  This is a new problem. The current episode started in the past 7 days. Neither side of throat is experiencing more pain than the other. There has been no fever. The pain is mild. Pertinent negatives include no abdominal pain, congestion, coughing or headaches.  c/o cough - green stuff F/u B hand pain and finger joint swelling x  wks - stiff in am - she saw Dr Charlestine Night F/u HTN, COPD F/u R LBP going down R leg - much better  Wt Readings from Last 3 Encounters:  03/15/12 122 lb 1.3 oz (55.375 kg)  02/08/12 124 lb (56.246 kg)  12/12/11 126 lb (57.153 kg)   BP Readings from Last 3 Encounters:  03/15/12 120/68  02/08/12 126/68  12/12/11 128/60     Review of Systems  Constitutional: Negative for chills, activity change, appetite change, fatigue and unexpected weight change.  HENT: Positive for nosebleeds. Negative for congestion, mouth sores and sinus pressure.   Eyes: Negative for visual disturbance.  Respiratory: Negative for cough and chest tightness.   Gastrointestinal: Negative for nausea and abdominal pain.  Genitourinary: Negative for frequency, difficulty urinating and vaginal pain.  Musculoskeletal: Positive for joint swelling and arthralgias. Negative for back pain and gait problem.       Hands  Skin: Negative for pallor and rash.  Neurological: Negative for dizziness, tremors, weakness and headaches.  Psychiatric/Behavioral: Negative for confusion and sleep disturbance.   Past Medical History  Diagnosis Date  . Atrial fibrillation   . Arthritis   . Hypertension   . B12 deficiency   . Depression   . Anxiety   . GERD (gastroesophageal reflux disease)   . Vitamin D deficiency disease   . COPD (chronic obstructive pulmonary disease)   . Pneumonia   . Peptic ulcer, unspecified site, unspecified as acute or chronic, without mention of hemorrhage, perforation, or obstruction   . Osteoarthrosis, hand     both hands   Past Surgical History    Procedure Date  . Bladder surgery     Bladder tack and intestines  . Splenectomy   . Cystocele repair     reports that she has quit smoking. She has never used smokeless tobacco. She reports that she does not drink alcohol or use illicit drugs. family history includes Arthritis in an unspecified family member; Coronary artery disease in an unspecified family member; Heart disease in her father; and Hypertension in an unspecified family member. Allergies  Allergen Reactions  . Amoxicillin   . Codeine     REACTION: Nausea  . Other     Pt does not want to take any pain meds   Current Outpatient Prescriptions on File Prior to Visit  Medication Sig Dispense Refill  . aspirin 81 MG chewable tablet Chew 81 mg by mouth. Two times daily       . Cholecalciferol ([facility] VITAMIN D3) 1000 UNITS capsule Take 1,000 Units by mouth daily.        . flecainide (TAMBOCOR) 50 MG tablet TAKE 1 TABLET BY MOUTH TWICE A DAY  60 tablet  4  . LORazepam (ATIVAN) 1 MG tablet TAKE 1 TABLET EVERY 8 HOURS AS NEEDED FOR ANXIETY  90 tablet  5  . metoprolol tartrate (LOPRESSOR) 25 MG tablet Take 0.5 tablets (12.5 mg total) by mouth 2 (two) times daily.  90 tablet  1  . omeprazole (PRILOSEC) 20 MG capsule Take 20 mg by mouth daily.      Marland Kitchen  vitamin B-12 (CYANOCOBALAMIN) 1000 MCG tablet Take 1,000 mcg by mouth. Take 1/2 tablet daily        BP 120/68  Pulse 65  Temp 97.9 F (36.6 C) (Oral)  Resp 12  Wt 122 lb 1.3 oz (55.375 kg)  SpO2 97%      Objective:   Physical Exam  Constitutional: She appears well-developed. No distress.  HENT:  Head: Normocephalic.  Right Ear: External ear normal.  Left Ear: External ear normal.  Nose: Nose normal.  Mouth/Throat: Oropharynx is clear and moist.  Eyes: Conjunctivae normal are normal. Pupils are equal, round, and reactive to light. Right eye exhibits no discharge. Left eye exhibits no discharge.  Neck: Normal range of motion. Neck supple. No JVD present. No tracheal  deviation present. No thyromegaly present.  Cardiovascular: Normal rate, regular rhythm and normal heart sounds.   Pulmonary/Chest: No stridor. No respiratory distress. She has no wheezes.  Abdominal: Soft. Bowel sounds are normal. She exhibits no distension and no mass. There is no tenderness. There is no rebound and no guarding.  Musculoskeletal: She exhibits tenderness. She exhibits no edema.       B DIPs are less swollen and tender, palms are tender  Lymphadenopathy:    She has no cervical adenopathy.  Neurological: She displays normal reflexes. No cranial nerve deficit. She exhibits normal muscle tone.  Skin: No rash noted. No erythema.  Psychiatric: She has a normal mood and affect. Her behavior is normal. Judgment and thought content normal.  Eryth throat  Lab Results  Component Value Date   WBC 10.8* 12/12/2011   HGB 10.3* 12/12/2011   HCT 32.9* 12/12/2011   PLT 427.0* 12/12/2011   GLUCOSE 84 12/12/2011   CHOL 225* 01/31/2011   TRIG 102.0 01/31/2011   HDL 72.30 01/31/2011   LDLDIRECT 144.3 01/31/2011   LDLCALC  Value: 154        Total Cholesterol/HDL:CHD Risk Coronary Heart Disease Risk Table                     Men   Women  1/2 Average Risk   3.4   3.3* 11/29/2007   ALT 14 06/26/2011   AST 20 06/26/2011   NA 140 12/12/2011   K 4.1 12/12/2011   CL 110 12/12/2011   CREATININE 1.1 12/12/2011   BUN 21 12/12/2011   CO2 22 12/12/2011   TSH 1.66 06/26/2011   INR 0.9 08/13/2008    03/01/09 LEFT HAND - COMPLETE 3+ VIEW  Comparison: None  Findings: No evidence of acute fracture dislocation of the left  hand with particular attention directed to the fifth metacarpal.  There is joint space narrowing of the PIP joints of the fourth and  fifth digits and the DIP joint of the third digit. No soft tissue  abnormality.  IMPRESSION:  1. No evidence of acute fracture or dislocation.  2. Osteoarthritis.      Assessment & Plan:

## 2012-03-15 NOTE — Assessment & Plan Note (Signed)
Zpac 

## 2012-04-01 ENCOUNTER — Other Ambulatory Visit: Payer: Self-pay | Admitting: Cardiology

## 2012-04-11 ENCOUNTER — Encounter: Payer: Self-pay | Admitting: Internal Medicine

## 2012-04-26 ENCOUNTER — Telehealth: Payer: Self-pay | Admitting: Cardiology

## 2012-04-26 NOTE — Telephone Encounter (Signed)
Pt having trouble breathing and needs to know what to do

## 2012-04-26 NOTE — Telephone Encounter (Signed)
Pt reports increased feeling of not being able to get her breathe for the past couple of days.  Wants to know if Dr Stanford Breed would give her an inhaler to help with her COPD.  Advised pt he general doesn't treat this DX.  She denies any chest pain or edema.  Reports having increased stress level and feels like this may be contributing to it.  Advised her to  Contact her PCP for evaluation.  She is in agreement.

## 2012-05-14 ENCOUNTER — Other Ambulatory Visit: Payer: Self-pay | Admitting: Internal Medicine

## 2012-06-14 ENCOUNTER — Other Ambulatory Visit: Payer: Self-pay | Admitting: Cardiology

## 2012-07-19 ENCOUNTER — Other Ambulatory Visit (INDEPENDENT_AMBULATORY_CARE_PROVIDER_SITE_OTHER): Payer: Medicare Other

## 2012-07-19 ENCOUNTER — Ambulatory Visit (INDEPENDENT_AMBULATORY_CARE_PROVIDER_SITE_OTHER): Payer: Medicare Other | Admitting: Internal Medicine

## 2012-07-19 ENCOUNTER — Encounter: Payer: Self-pay | Admitting: Internal Medicine

## 2012-07-19 VITALS — BP 140/68 | HR 72 | Temp 97.4°F | Resp 16 | Wt 116.0 lb

## 2012-07-19 DIAGNOSIS — K219 Gastro-esophageal reflux disease without esophagitis: Secondary | ICD-10-CM

## 2012-07-19 DIAGNOSIS — I10 Essential (primary) hypertension: Secondary | ICD-10-CM

## 2012-07-19 DIAGNOSIS — F411 Generalized anxiety disorder: Secondary | ICD-10-CM

## 2012-07-19 DIAGNOSIS — E538 Deficiency of other specified B group vitamins: Secondary | ICD-10-CM

## 2012-07-19 DIAGNOSIS — R634 Abnormal weight loss: Secondary | ICD-10-CM

## 2012-07-19 LAB — HEPATIC FUNCTION PANEL
ALT: 11 U/L (ref 0–35)
AST: 25 U/L (ref 0–37)
Albumin: 3.6 g/dL (ref 3.5–5.2)
Alkaline Phosphatase: 56 U/L (ref 39–117)
Bilirubin, Direct: 0 mg/dL (ref 0.0–0.3)
Total Bilirubin: 0.3 mg/dL (ref 0.3–1.2)
Total Protein: 6.7 g/dL (ref 6.0–8.3)

## 2012-07-19 LAB — VITAMIN B12: Vitamin B-12: 849 pg/mL (ref 211–911)

## 2012-07-19 LAB — CBC WITH DIFFERENTIAL/PLATELET
HCT: 35.6 % — ABNORMAL LOW (ref 36.0–46.0)
Hemoglobin: 11.6 g/dL — ABNORMAL LOW (ref 12.0–15.0)
MCHC: 32.6 g/dL (ref 30.0–36.0)
MCV: 81.6 fl (ref 78.0–100.0)
Platelets: 480 10*3/uL — ABNORMAL HIGH (ref 150.0–400.0)
RBC: 4.36 Mil/uL (ref 3.87–5.11)
RDW: 19.6 % — ABNORMAL HIGH (ref 11.5–14.6)
WBC: 10.5 10*3/uL (ref 4.5–10.5)

## 2012-07-19 LAB — URINALYSIS
Bilirubin Urine: NEGATIVE
Hgb urine dipstick: NEGATIVE
Ketones, ur: NEGATIVE
Leukocytes, UA: NEGATIVE
Nitrite: NEGATIVE
Specific Gravity, Urine: >=1.03 (ref 1.000–1.030)
Total Protein, Urine: NEGATIVE
Urine Glucose: NEGATIVE
Urobilinogen, UA: 0.2 (ref 0.0–1.0)
pH: 5.5 (ref 5.0–8.0)

## 2012-07-19 LAB — BASIC METABOLIC PANEL
BUN: 19 mg/dL (ref 6–23)
CO2: 26 mEq/L (ref 19–32)
Calcium: 9.4 mg/dL (ref 8.4–10.5)
Chloride: 108 mEq/L (ref 96–112)
Creatinine, Ser: 1 mg/dL (ref 0.4–1.2)
GFR: 58.19 mL/min — ABNORMAL LOW (ref >60.00)
Glucose, Bld: 78 mg/dL (ref 70–99)
Potassium: 5.7 mEq/L — ABNORMAL HIGH (ref 3.5–5.1)
Sodium: 144 mEq/L (ref 135–145)

## 2012-07-19 LAB — TSH: TSH: 1.19 u[IU]/mL (ref 0.35–5.50)

## 2012-07-19 LAB — H. PYLORI ANTIBODY, IGG: H Pylori IgG: NEGATIVE

## 2012-07-19 MED ORDER — MIRTAZAPINE 15 MG PO TABS: 15.0000 mg | ORAL_TABLET | Freq: Every day | ORAL | Status: DC

## 2012-07-19 NOTE — Assessment & Plan Note (Signed)
Continue with current prescription therapy as reflected on the Med list.  
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
## 2012-07-19 NOTE — Assessment & Plan Note (Addendum)
Continue with current prescription therapy as reflected on the Med list. Stress discussed Start Remeron

## 2012-07-19 NOTE — Assessment & Plan Note (Signed)
Wt Readings from Last 3 Encounters:  07/19/12 116 lb (52.617 kg)  03/15/12 122 lb 1.3 oz (55.375 kg)  02/08/12 124 lb (56.246 kg)

## 2012-07-19 NOTE — Assessment & Plan Note (Signed)
Continue with current prescription therapy as reflected on the Med list. Labs  

## 2012-08-02 ENCOUNTER — Ambulatory Visit: Payer: Medicare Other | Admitting: Internal Medicine

## 2012-08-06 ENCOUNTER — Ambulatory Visit (INDEPENDENT_AMBULATORY_CARE_PROVIDER_SITE_OTHER): Payer: Medicare Other | Admitting: Internal Medicine

## 2012-08-06 ENCOUNTER — Encounter: Payer: Self-pay | Admitting: Internal Medicine

## 2012-08-06 VITALS — BP 142/70 | HR 68 | Temp 97.5°F | Resp 16 | Wt 118.0 lb

## 2012-08-06 DIAGNOSIS — K279 Peptic ulcer, site unspecified, unspecified as acute or chronic, without hemorrhage or perforation: Secondary | ICD-10-CM

## 2012-08-06 DIAGNOSIS — I10 Essential (primary) hypertension: Secondary | ICD-10-CM

## 2012-08-06 DIAGNOSIS — E538 Deficiency of other specified B group vitamins: Secondary | ICD-10-CM

## 2012-08-06 DIAGNOSIS — G4700 Insomnia, unspecified: Secondary | ICD-10-CM

## 2012-08-06 DIAGNOSIS — F411 Generalized anxiety disorder: Secondary | ICD-10-CM

## 2012-08-06 DIAGNOSIS — K219 Gastro-esophageal reflux disease without esophagitis: Secondary | ICD-10-CM

## 2012-08-06 DIAGNOSIS — F329 Major depressive disorder, single episode, unspecified: Secondary | ICD-10-CM

## 2012-08-06 NOTE — Assessment & Plan Note (Signed)
Cont w/Prilosec 
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
## 2012-08-06 NOTE — Assessment & Plan Note (Signed)
Continue with current prescription therapy as reflected on the Med list.  

## 2012-09-06 ENCOUNTER — Other Ambulatory Visit: Payer: Self-pay | Admitting: Cardiology

## 2012-10-05 ENCOUNTER — Other Ambulatory Visit: Payer: Self-pay | Admitting: Internal Medicine

## 2012-10-11 ENCOUNTER — Other Ambulatory Visit: Payer: Self-pay | Admitting: Internal Medicine

## 2012-10-15 NOTE — Telephone Encounter (Signed)
Jerlyn Ly call from [facility] to check on med request for this pt. Please advise.

## 2012-11-06 ENCOUNTER — Encounter: Payer: Self-pay | Admitting: Internal Medicine

## 2012-11-06 ENCOUNTER — Ambulatory Visit (INDEPENDENT_AMBULATORY_CARE_PROVIDER_SITE_OTHER): Payer: Medicare Other | Admitting: Internal Medicine

## 2012-11-06 VITALS — BP 146/60 | HR 68 | Temp 97.5°F | Resp 16 | Ht 65.0 in | Wt 116.0 lb

## 2012-11-06 DIAGNOSIS — Z23 Encounter for immunization: Secondary | ICD-10-CM

## 2012-11-06 DIAGNOSIS — E538 Deficiency of other specified B group vitamins: Secondary | ICD-10-CM

## 2012-11-06 DIAGNOSIS — Z0000 Encounter for general adult medical examination without abnormal findings: Secondary | ICD-10-CM

## 2012-11-06 DIAGNOSIS — D649 Anemia, unspecified: Secondary | ICD-10-CM

## 2012-11-06 DIAGNOSIS — E785 Hyperlipidemia, unspecified: Secondary | ICD-10-CM

## 2012-11-06 DIAGNOSIS — F329 Major depressive disorder, single episode, unspecified: Secondary | ICD-10-CM

## 2012-11-06 DIAGNOSIS — F411 Generalized anxiety disorder: Secondary | ICD-10-CM

## 2012-11-06 MED ORDER — CLONAZEPAM 0.5 MG PO TABS: 0.5000 mg | ORAL_TABLET | Freq: Two times a day (BID) | ORAL | Status: DC | PRN

## 2012-11-06 MED ORDER — LORAZEPAM 1 MG PO TABS: 1.0000 mg | ORAL_TABLET | Freq: Two times a day (BID) | ORAL | Status: DC | PRN

## 2012-11-06 NOTE — Assessment & Plan Note (Signed)
Will change to Clonazepam - it worked better in the past

## 2012-11-06 NOTE — Assessment & Plan Note (Signed)
Here for medicare wellness/physical  Diet: heart healthy  Physical activity: not sedentary  Depression/mood screen: negative  Hearing: intact to whispered voice  Visual acuity: grossly normal, performs annual eye exam  ADLs: capable  Fall risk: none  Home safety: good  Cognitive evaluation: intact to orientation, naming, recall and repetition  EOL planning: adv directives, full code/ I agree  I have personally reviewed and have noted  1. The patient's medical and social history  2. Their use of alcohol, tobacco or illicit drugs  3. Their current medications and supplements  4. The patient's functional ability including ADL's, fall risks, home safety risks and hearing or visual impairment.  5. Diet and physical activities  6. Evidence for depression or mood disorders    Today patient counseled on age appropriate routine health concerns for screening and prevention, each reviewed and up to date or declined. Immunizations reviewed and up to date or declined. Labs ordered and reviewed. Risk factors for depression reviewed and negative. Hearing function and visual acuity are intact. ADLs screened and addressed as needed. Functional ability and level of safety reviewed and appropriate. Education, counseling and referrals performed based on assessed risks today. Patient provided with a copy of personalized plan for preventive services.    Wt Readings from Last 3 Encounters:  11/06/12 116 lb (52.617 kg)  08/06/12 118 lb (53.524 kg)  07/19/12 116 lb (52.617 kg)

## 2012-11-06 NOTE — Assessment & Plan Note (Signed)
Continue with current prescription therapy as reflected on the Med list.  
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
## 2013-03-07 ENCOUNTER — Other Ambulatory Visit: Payer: Self-pay | Admitting: Cardiology

## 2013-03-08 ENCOUNTER — Emergency Department (HOSPITAL_COMMUNITY)
Admission: EM | Admit: 2013-03-08 | Discharge: 2013-03-08 | Disposition: A | Discharge status: Home / Self Care | Payer: Worker's Compensation | Attending: Emergency Medicine | Admitting: Emergency Medicine

## 2013-03-08 ENCOUNTER — Encounter (HOSPITAL_COMMUNITY): Payer: Self-pay | Admitting: Emergency Medicine

## 2013-03-08 DIAGNOSIS — I4891 Unspecified atrial fibrillation: Secondary | ICD-10-CM | POA: Insufficient documentation

## 2013-03-08 DIAGNOSIS — Z8739 Personal history of other diseases of the musculoskeletal system and connective tissue: Secondary | ICD-10-CM | POA: Insufficient documentation

## 2013-03-08 DIAGNOSIS — J449 Chronic obstructive pulmonary disease, unspecified: Secondary | ICD-10-CM | POA: Insufficient documentation

## 2013-03-08 DIAGNOSIS — I10 Essential (primary) hypertension: Secondary | ICD-10-CM | POA: Insufficient documentation

## 2013-03-08 DIAGNOSIS — Z79899 Other long term (current) drug therapy: Secondary | ICD-10-CM | POA: Insufficient documentation

## 2013-03-08 DIAGNOSIS — Y939 Activity, unspecified: Secondary | ICD-10-CM | POA: Insufficient documentation

## 2013-03-08 DIAGNOSIS — Z87891 Personal history of nicotine dependence: Secondary | ICD-10-CM | POA: Insufficient documentation

## 2013-03-08 DIAGNOSIS — W260XXA Contact with knife, initial encounter: Secondary | ICD-10-CM | POA: Insufficient documentation

## 2013-03-08 DIAGNOSIS — Z7982 Long term (current) use of aspirin: Secondary | ICD-10-CM | POA: Insufficient documentation

## 2013-03-08 DIAGNOSIS — Y929 Unspecified place or not applicable: Secondary | ICD-10-CM | POA: Insufficient documentation

## 2013-03-08 DIAGNOSIS — S61012A Laceration without foreign body of left thumb without damage to nail, initial encounter: Secondary | ICD-10-CM

## 2013-03-08 DIAGNOSIS — E538 Deficiency of other specified B group vitamins: Secondary | ICD-10-CM | POA: Insufficient documentation

## 2013-03-08 DIAGNOSIS — F329 Major depressive disorder, single episode, unspecified: Secondary | ICD-10-CM | POA: Insufficient documentation

## 2013-03-08 DIAGNOSIS — Z8701 Personal history of pneumonia (recurrent): Secondary | ICD-10-CM | POA: Insufficient documentation

## 2013-03-08 DIAGNOSIS — Z8711 Personal history of peptic ulcer disease: Secondary | ICD-10-CM | POA: Insufficient documentation

## 2013-03-08 DIAGNOSIS — J4489 Other specified chronic obstructive pulmonary disease: Secondary | ICD-10-CM | POA: Insufficient documentation

## 2013-03-08 DIAGNOSIS — F411 Generalized anxiety disorder: Secondary | ICD-10-CM | POA: Insufficient documentation

## 2013-03-08 DIAGNOSIS — F3289 Other specified depressive episodes: Secondary | ICD-10-CM | POA: Insufficient documentation

## 2013-03-08 DIAGNOSIS — W261XXA Contact with sword or dagger, initial encounter: Secondary | ICD-10-CM

## 2013-03-08 DIAGNOSIS — S61209A Unspecified open wound of unspecified finger without damage to nail, initial encounter: Secondary | ICD-10-CM | POA: Insufficient documentation

## 2013-03-08 MED ORDER — TETANUS-DIPHTH-ACELL PERTUSSIS 5-2.5-18.5 LF-MCG/0.5 IM SUSP
0.5000 mL | Freq: Once | INTRAMUSCULAR | Status: AC
  Administered 2013-03-08: 0.5 mL via INTRAMUSCULAR
  Filled 2013-03-08: qty 0.5

## 2013-03-08 MED ORDER — ACETAMINOPHEN 325 MG PO TABS: 650.0000 mg | ORAL_TABLET | Freq: Once | ORAL | Status: DC

## 2013-03-08 NOTE — ED Notes (Signed)
Pt refused tylenol

## 2013-03-08 NOTE — Discharge Instructions (Signed)
Tetanus and Diphtheria Vaccine Your caregiver has suggested that you receive an immunization to prevent tetanus (lockjaw) and diphtheria. Tetanus and diphtheria are serious and deadly infectious diseases of the past that have been nearly wiped out by modern immunizations. Td or DT vaccines (shots) are the immunizations given to help prevent these illnesses. Td is the medical term for a standard tetanus dose, small diphtheria dose. DT means both in standard doses. ABOUT THE DISEASES Tetanus (lockjaw) and diphtheria are serious diseases. Tetanus is caused by a germ that lives in the soil. It enters the body through a cut or wound, often caused by a nail or broken piece of glass. You cannot catch tetanus from another person. Diphtheria spreads when germs pass from an infected person to the nose or throat of others. Tetanus causes serious, painful spasms of all muscles. It can lead to:  "Locking" of the muscles of the jaw and throat, so the patient cannot open his or her mouth or swallow.  Damage to the heart muscle. Diphtheria causes a thick coating in the nose, throat, or airway. It can lead to:  Breathing problems.  Kidney problems.  Heart failure.  Paralysis.  Death. ABOUT THE VACCINES  A vaccine is a shot (immunization) that can help prevent a disease. Vaccines have helped lower the rates of getting certain diseases. If people stopped getting vaccinated, more people would develop illnesses. These vaccines can be used in three ways:  As catch-up for people who did not get all their doses when they were children.  As a booster dose every 10 years.  For protection against tetanus infection, after a wound. Benefits of the vaccines Vaccination is the best way to protect against tetanus and diphtheria. Because of vaccination, there are fewer cases of these diseases. Cases are rare in children because most get a routine vaccination with DTP (Diphtheria, Tetanus, and Pertussis), DTaP  (Diphtheria, Tetanus, and acellular Pertussis), or DT (Diphtheria and Tetanus) vaccines. There would be many more cases if we stopped vaccinating people. Tetanus kills about 1 in 5 people who are infected. WHEN SHOULD YOU GET TD VACCINE?  Td is made for people 62 years of age and older.  People who have not gotten at least 3 doses of any tetanus and diphtheria vaccine (DTP, DTaP or DT) during their lifetime should do so using Td. After a person gets the third dose, a Td dose is needed every 10 years all through life. This is because protection fades over time. Booster shots are needed every 10 years.  Other vaccines may be given at the same time as Td. You may not know today whether your immunizations are current. The vaccine given today is to protect you from your next cut or injury. It does not offer protection for the current injury. An immune globulin injection may be given, if protection is needed immediately. Check with your caregiver later regarding your immunization status. Tell your caregiver if the person getting the vaccine:  Has ever had a serious allergic reaction or other problem with Td, or any other tetanus and diphtheria vaccine (DTP, DTaP, or DT). People who have had a serious allergic reaction should not receive the vaccine.  Has epilepsy or another nervous system illness.  Has had Guillain Barre Syndrome (GBS) in the past.  Now has a moderate or severe illness.  Is pregnant.  If you are not sure, ask your caregiver. WHAT ARE THE RISKS FROM TD VACCINE?  As with any medicine, there are very small  risks that serious problems, even death, could occur after getting a vaccine. However, the risk of a serious side effect from the vaccine is almost zero.  The risks from the vaccine are much smaller than the risks from the diseases, if people stopped getting vaccinated. Both diseases can cause serious health problems, which are prevented by the vaccine.  Almost all people who get  Td have no problems from it. Mild problems If mild problems occur, they usually start within hours to a day or two after vaccination. They may last 1-2 days:  Soreness, redness, or swelling where the shot was given.  Headache or tiredness.  Occasionally, a low grade fever. These problems can be worse in adults who get Td vaccine very often. Non-aspirin medicines may be used to reduce soreness. Severe problems These problems happen very rarely:  Serious allergic reaction (at most, occurs in 1 in 1 million vaccinated persons). This occurs almost immediately, and is treatable with medicines. Signs of a serious allergic reaction include:  Difficulty breathing.  Hoarseness or wheezing.  Hives.  Dizziness.  Deep, aching pain and muscle wasting in upper arm(s). Overall, the benefits to you and your family from these vaccines are far greater than the risk. WHAT TO DO IF THERE IS A SERIOUS REACTION:  Call a caregiver or get the person to a doctor or emergency room right away.  Write down what happened, the date and time it happened, and tell your caregiver.  Ask your caregiver to file a Vaccine Adverse Event Report form or call, toll-free: [phone] If you want to learn more about this vaccine, ask your caregiver. She/he can give you the vaccine package insert or suggest other sources of information. Also, the [employer] gives compensation (payment) for persons thought to be injured by vaccines. For details call, toll-free: ([phone] [phone]. Document Released: 02/18/2000 Document Revised: 05/15/2011 Document Reviewed: 01/07/2009 [facility] Patient Information 2014 [place], [place]. Tissue Adhesive Wound Care A wound can be repaired by using tissue adhesive. Tissue adhesive holds the skin together and allows faster healing. It forms a strong bond on the skin in about 1 minute and reaches its full strength in about 2 or 3 minutes. The adhesive disappears  naturally while healing. Follow up is required if your caregiver wants to recheck for infection and to make sure your wound is healing properly.  You may need a tetanus shot if:  You cannot remember when you had your last tetanus shot.  You have never had a tetanus shot.  The injury broke your skin. If you got a tetanus shot, your arm may swell, get red, and feel warm to the touch. This is common and not a problem. If you need a tetanus shot and you choose not to have one, there is a rare chance of getting tetanus. Sickness from tetanus can be serious. HOME CARE INSTRUCTIONS   Only take over-the-counter or prescription medicines for pain, discomfort, or fever as directed by your caregiver.  Showers are allowed. Do not soak the area containing the tissue adhesive. Do not take baths, swim, or use hot tubs. Do not use any soaps or ointments on the wound until it has healed.  If a bandage (dressing) has been applied, follow your caregiver's instructions for how often to change the dressing.  Keep the dressing dry if one has been applied.  Do not scratch, pick, or rub the adhesive.  Do not place tape over the adhesive. The adhesive could come off  when pulling the tape off.  Protect the wound from further injury until it is healed.  Protect the wound from sun and tanning bed exposure while it is healing and for several weeks after healing.  Keep all follow-up appointments as directed by your caregiver. SEEK IMMEDIATE MEDICAL CARE IF:   Your wound becomes red, swollen, hot, or tender.  You have increasing pain in the wound.  You have a red streak that goes away from the wound.  You have pus coming from the wound.  You have a fever.  You have shaking chills.  There is a bad smell coming from the wound.  The wound or adhesive breaks open. MAKE SURE YOU:   Understand these instructions.  Will watch your condition.  Will get help right away if you are not doing well or get  worse. Document Released: 08/16/2000 Document Revised: 05/15/2011 Document Reviewed: 09/11/2012 [facility] Patient Information 2014 [place], [place].

## 2013-03-08 NOTE — ED Provider Notes (Signed)
CSN: [id number]     Arrival date & time 03/08/13  1350 History  This chart was scribed for non-physician practitioner Charlann Lange working with Dot Lanes, MD by Mercy Moore, ED Scribe. This patient was seen in room WTR8/WTR8 and the patient's care was started at 2:26 PM.   Chief Complaint  Patient presents with  . Laceration    The history is provided by the patient. No language interpreter was used.   HPI Comments: [name] is a 78 y.o. female who presents to the Emergency Department complaining of right thumb injured sustained this afternoon, 1.5 hours ago, while using a carving knife. Patient reports that she takes Aspirin.    Past Medical History  Diagnosis Date  . Atrial fibrillation   . Arthritis   . Hypertension   . B12 deficiency   . Depression   . Anxiety   . GERD (gastroesophageal reflux disease)   . Vitamin D deficiency disease   . COPD (chronic obstructive pulmonary disease)   . Pneumonia   . Peptic ulcer, unspecified site, unspecified as acute or chronic, without mention of hemorrhage, perforation, or obstruction   . Osteoarthrosis, hand     both hands   Past Surgical History  Procedure Laterality Date  . Bladder surgery      Bladder tack and intestines  . Splenectomy    . Cystocele repair     Family History  Problem Relation Age of Onset  . Arthritis      Family history of  . Coronary artery disease      Family history of  . Hypertension      Family history of  . Heart disease Father    History  Substance Use Topics  . Smoking status: Former Research scientist (life sciences)  . Smokeless tobacco: Never Used  . Alcohol Use: No   OB History   Grav Para Term Preterm Abortions TAB SAB Ect Mult Living                 Review of Systems  Skin: Positive for wound.  All other systems reviewed and are negative.    Allergies  Amoxicillin; Codeine; and Other  Home Medications   Current Outpatient Rx  Name  Route  Sig  Dispense  Refill  . aspirin 81 MG chewable  tablet   Oral   Chew 81 mg by mouth 2 (two) times daily.          . Cholecalciferol ([employer] VITAMIN D3) 1000 UNITS capsule   Oral   Take 1,000 Units by mouth daily.           . clonazePAM (KLONOPIN) 0.5 MG tablet   Oral   Take 1 tablet (0.5 mg total) by mouth 2 (two) times daily as needed for anxiety.   60 tablet   5   . flecainide (TAMBOCOR) 50 MG tablet   Oral   Take 50 mg by mouth 2 (two) times daily.         . metoprolol tartrate (LOPRESSOR) 25 MG tablet   Oral   Take 12.5 mg by mouth 2 (two) times daily.         . mirtazapine (REMERON) 15 MG tablet      TAKE 1 TABLET (15 MG TOTAL) BY MOUTH AT BEDTIME.   30 tablet   5   . omeprazole (PRILOSEC) 20 MG capsule   Oral   Take 20 mg by mouth daily.         . vitamin B-12 (  CYANOCOBALAMIN) 1000 MCG tablet   Oral   Take 500 mcg by mouth daily.           Triage Vitals: BP 144/56  Pulse 73  Temp(Src) 98.2 F (36.8 C) (Oral)  Resp 18  SpO2 97% Physical Exam  Nursing note and vitals reviewed. Constitutional: She is oriented to person, place, and time. She appears well-developed and well-nourished. No distress.  HENT:  Head: Normocephalic and atraumatic.  Eyes: EOM are normal.  Neck: Neck supple. No tracheal deviation present.  Cardiovascular: Normal rate.   Pulmonary/Chest: Effort normal. No respiratory distress.  Musculoskeletal: Normal range of motion.  Neurological: She is alert and oriented to person, place, and time.  Skin: Skin is warm and dry.  2 cm laceration dorsal left thumb over proximal phalanx.  Full range of motion. No tendon deficits.    Psychiatric: She has a normal mood and affect. Her behavior is normal.    ED Course  Procedures (including critical care time) DIAGNOSTIC STUDIES: Oxygen Saturation is 97% on room air, normal by my interpretation.    COORDINATION OF CARE: 2:26 PM- Pt advised of plan for treatment and pt agrees.  LACERATION REPAIR Performed by: Charlann Lange  A Authorized by: Charlann Lange A Consent: Verbal consent obtained. Risks and benefits: risks, benefits and alternatives were discussed Consent given by: patient Patient identity confirmed: provided demographic data Prepped and Draped in normal sterile fashion Wound explored  Laceration Location: left thumb, dorsally  Laceration Length: 2cm  No Foreign Bodies seen or palpated  Anesthesia: local infiltration  Local anesthetic: lidocaine n/a% n/a epinephrine  Anesthetic total: n/a ml  Irrigation method: syringe Amount of cleaning: standard  Skin closure: dermabond, steri-strip  Number of sutures: n/a  Technique: dermabond, steri-strip  Patient tolerance: Patient tolerated the procedure well with no immediate complications.   Labs Review Labs Reviewed - No data to display Imaging Review No results found.  EKG Interpretation   None       MDM  No diagnosis found. 1. Finger laceration  Wound dermabonded with good closure. Reinforced with steri-strip.   I personally performed the services described in this documentation, which was scribed in my presence. The recorded information has been reviewed and is accurate.     Dewaine Oats, PA-C 03/08/13 1442

## 2013-03-08 NOTE — ED Notes (Signed)
Pt cut right thumb with carving knife

## 2013-03-08 NOTE — ED Provider Notes (Signed)
Medical screening examination/treatment/procedure(s) were performed by non-physician practitioner and as supervising physician I was immediately available for consultation/collaboration.    Dot Lanes, MD 03/08/13 1452

## 2013-03-11 ENCOUNTER — Ambulatory Visit: Payer: Medicare Other | Admitting: Internal Medicine

## 2013-03-17 ENCOUNTER — Ambulatory Visit (INDEPENDENT_AMBULATORY_CARE_PROVIDER_SITE_OTHER): Payer: Medicare Other | Admitting: Internal Medicine

## 2013-03-17 ENCOUNTER — Encounter: Payer: Self-pay | Admitting: Internal Medicine

## 2013-03-17 VITALS — BP 132/72 | HR 76 | Temp 96.8°F | Resp 16 | Wt 119.0 lb

## 2013-03-17 DIAGNOSIS — M545 Low back pain, unspecified: Secondary | ICD-10-CM

## 2013-03-17 DIAGNOSIS — E559 Vitamin D deficiency, unspecified: Secondary | ICD-10-CM

## 2013-03-17 DIAGNOSIS — I10 Essential (primary) hypertension: Secondary | ICD-10-CM

## 2013-03-17 DIAGNOSIS — E538 Deficiency of other specified B group vitamins: Secondary | ICD-10-CM

## 2013-03-17 DIAGNOSIS — M1990 Unspecified osteoarthritis, unspecified site: Secondary | ICD-10-CM

## 2013-03-17 DIAGNOSIS — Z0000 Encounter for general adult medical examination without abnormal findings: Secondary | ICD-10-CM

## 2013-03-17 DIAGNOSIS — F329 Major depressive disorder, single episode, unspecified: Secondary | ICD-10-CM

## 2013-03-17 DIAGNOSIS — H6122 Impacted cerumen, left ear: Secondary | ICD-10-CM

## 2013-03-17 DIAGNOSIS — H6120 Impacted cerumen, unspecified ear: Secondary | ICD-10-CM

## 2013-03-17 DIAGNOSIS — G2581 Restless legs syndrome: Secondary | ICD-10-CM

## 2013-03-17 DIAGNOSIS — F3289 Other specified depressive episodes: Secondary | ICD-10-CM

## 2013-03-17 DIAGNOSIS — F411 Generalized anxiety disorder: Secondary | ICD-10-CM

## 2013-03-17 MED ORDER — LORAZEPAM 1 MG PO TABS: 1.0000 mg | ORAL_TABLET | Freq: Two times a day (BID) | ORAL | Status: DC | PRN

## 2013-03-17 MED ORDER — CLONAZEPAM 0.5 MG PO TABS: 0.5000 mg | ORAL_TABLET | Freq: Every day | ORAL | Status: DC

## 2013-03-17 MED ORDER — FLUCONAZOLE 150 MG PO TABS: 150.0000 mg | ORAL_TABLET | Freq: Once | ORAL | Status: DC

## 2013-03-17 NOTE — Assessment & Plan Note (Signed)
Continue with current prescription therapy as reflected on the Med list - Klonopin

## 2013-03-17 NOTE — Assessment & Plan Note (Signed)
Lorazepam prn 

## 2013-03-17 NOTE — Assessment & Plan Note (Signed)
Continue with current prescription therapy as reflected on the Med list.  

## 2013-03-17 NOTE — Progress Notes (Signed)
Pre visit review using our clinic review tool, if applicable. No additional management support is needed unless otherwise documented below in the visit note. 
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
## 2013-03-17 NOTE — Assessment & Plan Note (Signed)
Will irrigate 

## 2013-03-21 ENCOUNTER — Other Ambulatory Visit (INDEPENDENT_AMBULATORY_CARE_PROVIDER_SITE_OTHER): Payer: Medicare Other

## 2013-03-21 DIAGNOSIS — D649 Anemia, unspecified: Secondary | ICD-10-CM

## 2013-03-21 DIAGNOSIS — E785 Hyperlipidemia, unspecified: Secondary | ICD-10-CM

## 2013-03-21 DIAGNOSIS — Z0000 Encounter for general adult medical examination without abnormal findings: Secondary | ICD-10-CM

## 2013-03-21 LAB — CBC WITH DIFFERENTIAL/PLATELET
Basophils Absolute: 0 10*3/uL (ref 0.0–0.1)
Basophils Relative: 0.3 % (ref 0.0–3.0)
Eosinophils Absolute: 0.5 10*3/uL (ref 0.0–0.7)
Eosinophils Relative: 4.9 % (ref 0.0–5.0)
HCT: 35.3 % — ABNORMAL LOW (ref 36.0–46.0)
Hemoglobin: 11.1 g/dL — ABNORMAL LOW (ref 12.0–15.0)
Lymphocytes Relative: 26.2 % (ref 12.0–46.0)
Lymphs Abs: 2.6 10*3/uL (ref 0.7–4.0)
MCHC: 31.6 g/dL (ref 30.0–36.0)
MCV: 85.8 fl (ref 78.0–100.0)
Monocytes Absolute: 1.2 10*3/uL — ABNORMAL HIGH (ref 0.1–1.0)
Monocytes Relative: 11.6 % (ref 3.0–12.0)
Neutro Abs: 5.7 10*3/uL (ref 1.4–7.7)
Neutrophils Relative %: 57 % (ref 43.0–77.0)
Platelets: 435 10*3/uL — ABNORMAL HIGH (ref 150.0–400.0)
RBC: 4.11 Mil/uL (ref 3.87–5.11)
RDW: 17.5 % — ABNORMAL HIGH (ref 11.5–14.6)
WBC: 10.1 10*3/uL (ref 4.5–10.5)

## 2013-03-21 LAB — LIPID PANEL
Cholesterol: 199 mg/dL (ref 0–200)
HDL: 57.5 mg/dL (ref >39.00)
LDL Cholesterol: 120 mg/dL — ABNORMAL HIGH (ref 0–99)
Total CHOL/HDL Ratio: 3
Triglycerides: 107 mg/dL (ref 0.0–149.0)
VLDL: 21.4 mg/dL (ref 0.0–40.0)

## 2013-03-21 LAB — BASIC METABOLIC PANEL
BUN: 24 mg/dL — ABNORMAL HIGH (ref 6–23)
CO2: 26 mEq/L (ref 19–32)
Calcium: 9 mg/dL (ref 8.4–10.5)
Chloride: 108 mEq/L (ref 96–112)
Creatinine, Ser: 1 mg/dL (ref 0.4–1.2)
GFR: 56.12 mL/min — ABNORMAL LOW (ref >60.00)
Glucose, Bld: 65 mg/dL — ABNORMAL LOW (ref 70–99)
Potassium: 5.5 mEq/L — ABNORMAL HIGH (ref 3.5–5.1)
Sodium: 141 mEq/L (ref 135–145)

## 2013-03-21 LAB — TSH: TSH: 1.83 u[IU]/mL (ref 0.35–5.50)

## 2013-03-31 ENCOUNTER — Ambulatory Visit: Payer: Self-pay | Admitting: Cardiology

## 2013-04-02 ENCOUNTER — Other Ambulatory Visit: Payer: Self-pay | Admitting: *Deleted

## 2013-04-02 MED ORDER — FLECAINIDE ACETATE 50 MG PO TABS
50.0000 mg | ORAL_TABLET | Freq: Two times a day (BID) | ORAL | Status: DC
Start: 2013-04-02 — End: 2013-06-16

## 2013-04-08 ENCOUNTER — Telehealth: Payer: Self-pay | Admitting: Internal Medicine

## 2013-04-08 NOTE — Telephone Encounter (Signed)
Relevant patient education assigned to patient using Emmi. ° °

## 2013-05-02 ENCOUNTER — Telehealth: Payer: Self-pay | Admitting: Internal Medicine

## 2013-05-02 MED ORDER — LORAZEPAM 1 MG PO TABS: 1.0000 mg | ORAL_TABLET | Freq: Two times a day (BID) | ORAL | Status: DC | PRN

## 2013-05-02 NOTE — Telephone Encounter (Signed)
Erline Levine, please, tell patient that she needs to use Clonazepam no more than prescribed: she was refilling it too often Thx

## 2013-05-02 NOTE — Telephone Encounter (Signed)
Left mess for patient to call back.  

## 2013-05-05 NOTE — Telephone Encounter (Signed)
Left mess for patient to call back.  

## 2013-05-06 NOTE — Telephone Encounter (Signed)
Returned call to pt x 3, no call back and closing note until pt returns call.

## 2013-05-07 ENCOUNTER — Ambulatory Visit: Payer: Self-pay | Admitting: Internal Medicine

## 2013-05-07 ENCOUNTER — Telehealth: Payer: Self-pay | Admitting: *Deleted

## 2013-05-07 NOTE — Telephone Encounter (Signed)
Sister phoned stating that patient was really weak and 'acting like she needs some blood".  PCP schedule full, pt scheduled for 1545 for Dr. Alona Bene today.

## 2013-05-09 ENCOUNTER — Encounter (HOSPITAL_COMMUNITY): Payer: Self-pay | Admitting: Emergency Medicine

## 2013-05-09 ENCOUNTER — Emergency Department (HOSPITAL_COMMUNITY): Payer: Medicare Other

## 2013-05-09 ENCOUNTER — Emergency Department (HOSPITAL_COMMUNITY)
Admission: EM | Admit: 2013-05-09 | Discharge: 2013-05-09 | Disposition: A | Discharge status: Home / Self Care | Payer: Medicare Other | Attending: Emergency Medicine | Admitting: Emergency Medicine

## 2013-05-09 ENCOUNTER — Telehealth: Payer: Self-pay | Admitting: Internal Medicine

## 2013-05-09 DIAGNOSIS — R5381 Other malaise: Secondary | ICD-10-CM | POA: Insufficient documentation

## 2013-05-09 DIAGNOSIS — F411 Generalized anxiety disorder: Secondary | ICD-10-CM | POA: Insufficient documentation

## 2013-05-09 DIAGNOSIS — I4891 Unspecified atrial fibrillation: Secondary | ICD-10-CM | POA: Insufficient documentation

## 2013-05-09 DIAGNOSIS — M19049 Primary osteoarthritis, unspecified hand: Secondary | ICD-10-CM | POA: Insufficient documentation

## 2013-05-09 DIAGNOSIS — Z87891 Personal history of nicotine dependence: Secondary | ICD-10-CM | POA: Insufficient documentation

## 2013-05-09 DIAGNOSIS — Z7982 Long term (current) use of aspirin: Secondary | ICD-10-CM | POA: Insufficient documentation

## 2013-05-09 DIAGNOSIS — R51 Headache: Secondary | ICD-10-CM | POA: Insufficient documentation

## 2013-05-09 DIAGNOSIS — K219 Gastro-esophageal reflux disease without esophagitis: Secondary | ICD-10-CM | POA: Insufficient documentation

## 2013-05-09 DIAGNOSIS — J441 Chronic obstructive pulmonary disease with (acute) exacerbation: Secondary | ICD-10-CM | POA: Insufficient documentation

## 2013-05-09 DIAGNOSIS — E559 Vitamin D deficiency, unspecified: Secondary | ICD-10-CM | POA: Insufficient documentation

## 2013-05-09 DIAGNOSIS — R5383 Other fatigue: Principal | ICD-10-CM

## 2013-05-09 DIAGNOSIS — H538 Other visual disturbances: Secondary | ICD-10-CM | POA: Insufficient documentation

## 2013-05-09 DIAGNOSIS — Z8701 Personal history of pneumonia (recurrent): Secondary | ICD-10-CM | POA: Insufficient documentation

## 2013-05-09 DIAGNOSIS — R531 Weakness: Secondary | ICD-10-CM

## 2013-05-09 DIAGNOSIS — R0600 Dyspnea, unspecified: Secondary | ICD-10-CM

## 2013-05-09 DIAGNOSIS — E538 Deficiency of other specified B group vitamins: Secondary | ICD-10-CM | POA: Insufficient documentation

## 2013-05-09 DIAGNOSIS — Z8711 Personal history of peptic ulcer disease: Secondary | ICD-10-CM | POA: Insufficient documentation

## 2013-05-09 DIAGNOSIS — I10 Essential (primary) hypertension: Secondary | ICD-10-CM | POA: Insufficient documentation

## 2013-05-09 DIAGNOSIS — Z79899 Other long term (current) drug therapy: Secondary | ICD-10-CM | POA: Insufficient documentation

## 2013-05-09 LAB — CBC
HCT: 34 % — ABNORMAL LOW (ref 36.0–46.0)
Hemoglobin: 10.6 g/dL — ABNORMAL LOW (ref 12.0–15.0)
MCH: 26.9 pg (ref 26.0–34.0)
MCHC: 31.2 g/dL (ref 30.0–36.0)
MCV: 86.3 fL (ref 78.0–100.0)
Platelets: 427 10*3/uL — ABNORMAL HIGH (ref 150–400)
RBC: 3.94 MIL/uL (ref 3.87–5.11)
RDW: 16.9 % — ABNORMAL HIGH (ref 11.5–15.5)
WBC: 11.2 10*3/uL — ABNORMAL HIGH (ref 4.0–10.5)

## 2013-05-09 LAB — BASIC METABOLIC PANEL
BUN: 26 mg/dL — ABNORMAL HIGH (ref 6–23)
CO2: 21 mEq/L (ref 19–32)
Calcium: 9 mg/dL (ref 8.4–10.5)
Chloride: 102 mEq/L (ref 96–112)
Creatinine, Ser: 1.14 mg/dL — ABNORMAL HIGH (ref 0.50–1.10)
GFR calc Af Amer: 53 mL/min — ABNORMAL LOW (ref >90)
GFR calc non Af Amer: 46 mL/min — ABNORMAL LOW (ref >90)
Glucose, Bld: 88 mg/dL (ref 70–99)
Potassium: 4.8 mEq/L (ref 3.7–5.3)
Sodium: 138 mEq/L (ref 137–147)

## 2013-05-09 LAB — I-STAT TROPONIN, ED: Troponin i, poc: 0.01 ng/mL (ref 0.00–0.08)

## 2013-05-09 LAB — D-DIMER, QUANTITATIVE (NOT AT ARMC): D-Dimer, Quant: 0.51 ug/mL-FEU — ABNORMAL HIGH (ref 0.00–0.48)

## 2013-05-09 LAB — PRO B NATRIURETIC PEPTIDE: Pro B Natriuretic peptide (BNP): 849.8 pg/mL — ABNORMAL HIGH (ref 0–450)

## 2013-05-09 NOTE — Telephone Encounter (Signed)
Spoke with PCP, can't work patient in today, advised her seeing another provider, which she refused to do-only wanted her PCP.  Initially, she was refusing to go to the ED, which PCP had also recommended she do, but was persuaded to go to [facility] ED.  Phoned Erline Levine, RN, [facility] ED Triage Nurse and gave report of sxs

## 2013-05-09 NOTE — ED Notes (Signed)
[name] son contact information [phone]

## 2013-05-09 NOTE — Discharge Instructions (Signed)
Your caregiver has seen you today because you are having problems with feelings of weakness, dizziness, and/or fatigue. Weakness has many different causes, some of which are common and others are very rare. Your caregiver has considered some of the most common causes of weakness and feels it is safe for you to go home and be observed. Not every illness or injury can be identified during an emergency department visit, thus follow-up with your primary healthcare provider is important. Medical conditions can also worsen, so it is also important to return immediately as directed below, or if you have other serious concerns develop. RETURN IMMEDIATELY IF you develop new shortness of breath, chest pain, fever, have difficulty moving parts of your body (new weakness, numbness, or incoordination), sudden change in speech, vision, swallowing, or understanding, faint or develop new dizziness, severe headache, become poorly responsive or have an altered mental status compared to baseline for you, new rash, abdominal pain, or bloody stools,  Return sooner also if you develop new problems for which you have not talked to your caregiver but you feel may be emergency medical conditions, or are unable to be cared for safely at home.  

## 2013-05-09 NOTE — ED Notes (Signed)
Pt reports feeling short of breath for several weeks. Pt reports having a headache for five days. Pt reports blurred vision for one month. Pt reports being evaluated by her ophthalmologist, which the patient reports that the provider stated nothing was wrong with her eyes. Pt reports that while at work she gets fatigued and has to sit, pt states the last time she felt like this she had low hemoglobin.

## 2013-05-09 NOTE — ED Provider Notes (Signed)
CSN: [id number]     Arrival date & time 05/09/13  1510 History   First MD Initiated Contact with Patient 05/09/13 1608     Chief Complaint  Patient presents with  . Shortness of Breath  . Blurred Vision  . Headache     (Consider location/radiation/quality/duration/timing/severity/associated sxs/prior Treatment) HPI 78 year old female with history of atrial fibrillation and COPD takes flecainide for rhythm control refuses to take anticoagulation presents now with several weeks of constant 24-hour a day mild to moderate shortness of breath worse with activity but not resolved with rest she has associated generalized fatigue as well as a constant 24-hour a feeling of vague blurred vision in both eyes without flashing lights or scotomas or sudden onset and patient states she was cleared by her eye doctor with no explanation for her constant blurred vision for over a month, she also has a one-week no history of gradual onset mild diffuse headache which has become moderately severe but she has no fever no chills no rash no trauma no change in vision in the last week no change in speech swallowing or understanding no focal or lateralizing weakness numbness or incoordination no chest pain no cough no fever no abdominal pain no bloody stools no black stools and no treatment prior to arrival. She is walking normally and drove herself to the emergency department. She was referred to the emergency department by her primary care doctors office because she wanted to see your primary care Dr. who could not see her today and she refused an appointment with one of his partners. Past Medical History  Diagnosis Date  . Atrial fibrillation   . Arthritis   . Hypertension   . B12 deficiency   . Depression   . Anxiety   . GERD (gastroesophageal reflux disease)   . Vitamin D deficiency disease   . COPD (chronic obstructive pulmonary disease)   . Pneumonia   . Peptic ulcer, unspecified site, unspecified as acute or  chronic, without mention of hemorrhage, perforation, or obstruction   . Osteoarthrosis, hand     both hands   Past Surgical History  Procedure Laterality Date  . Bladder surgery      Bladder tack and intestines  . Splenectomy    . Cystocele repair     Family History  Problem Relation Age of Onset  . Arthritis      Family history of  . Coronary artery disease      Family history of  . Hypertension      Family history of  . Heart disease Father    History  Substance Use Topics  . Smoking status: Former Research scientist (life sciences)  . Smokeless tobacco: Never Used  . Alcohol Use: No   OB History   Grav Para Term Preterm Abortions TAB SAB Ect Mult Living                 Review of Systems  10 Systems reviewed and are negative for acute change except as noted in the HPI.  Allergies  Amoxicillin; Codeine; and Other  Home Medications   Current Outpatient Rx  Name  Route  Sig  Dispense  Refill  . aspirin 81 MG chewable tablet   Oral   Chew 81 mg by mouth 2 (two) times daily.          . Aspirin-Salicylamide-Caffeine (BC HEADACHE PO)   Oral   Take 1 packet by mouth as needed (pain).         . Cholecalciferol (  [employer]S VITAMIN D3) 1000 UNITS capsule   Oral   Take 1,000 Units by mouth daily.           . clonazePAM (KLONOPIN) 0.5 MG tablet   Oral   Take 1-2 tablets (0.5-1 mg total) by mouth at bedtime.   60 tablet   5   . flecainide (TAMBOCOR) 50 MG tablet   Oral   Take 1 tablet (50 mg total) by mouth 2 (two) times daily.   60 tablet   1     **PATIENT MUST KEEP 05/30/13 APPOINTMENT FOR FURTHE .Marland Kitchen.   . lactose free nutrition (BOOST) LIQD   Oral   Take 237 mLs by mouth daily with breakfast.         . LORazepam (ATIVAN) 1 MG tablet   Oral   Take 1 tablet (1 mg total) by mouth 2 (two) times daily as needed for anxiety. As needed, two times a day   60 tablet   5     Fill no more than 60 tabs per month please   . metoprolol tartrate (LOPRESSOR) 25 MG tablet   Oral   Take  12.5 mg by mouth 2 (two) times daily.         Marland Kitchen omeprazole (PRILOSEC) 20 MG capsule   Oral   Take 20 mg by mouth daily.         Vladimir Faster Glycol-Propyl Glycol (SYSTANE OP)   Both Eyes   Place 1 drop into both eyes daily as needed (dry eyes).         . vitamin B-12 (CYANOCOBALAMIN) 1000 MCG tablet   Oral   Take 500 mcg by mouth daily.           BP 156/60  Pulse 63  Temp(Src) 97.9 F (36.6 C) (Oral)  Resp 22  SpO2 94% Physical Exam  Nursing note and vitals reviewed. Constitutional:  Awake, alert, nontoxic appearance with baseline speech for patient.  HENT:  Head: Atraumatic.  Mouth/Throat: No oropharyngeal exudate.  Eyes: EOM are normal. Pupils are equal, round, and reactive to light. Right eye exhibits no discharge. Left eye exhibits no discharge.  No nystagmus noted  Neck: Neck supple.  Cardiovascular: Normal rate and regular rhythm.   No murmur heard. Pulmonary/Chest: Effort normal. No stridor. No respiratory distress. She has no wheezes. She has rales. She exhibits no tenderness.  Few crackles right base only; respirations unlabored pulse oximetry normal room air 97%  Abdominal: Soft. Bowel sounds are normal. She exhibits no mass. There is no tenderness. There is no rebound.  Musculoskeletal: She exhibits no edema and no tenderness.  Baseline ROM, moves extremities with no obvious new focal weakness.  Lymphadenopathy:    She has no cervical adenopathy.  Neurological: She is alert.  Awake, alert, cooperative and aware of situation; motor strength bilaterally; sensation normal to light touch bilaterally; peripheral visual fields full to confrontation; no facial asymmetry; tongue midline; major cranial nerves appear intact; no pronator drift, normal finger to nose bilaterally, baseline gait without new ataxia.  Skin: No rash noted.  Psychiatric: She has a normal mood and affect.    ED Course  Procedures (including critical care time) Pt stable in ED with no  significant deterioration in condition.Patient / Family / Caregiver informed of clinical course, understand medical decision-making process, and agree with plan. Labs Review Labs Reviewed  BASIC METABOLIC PANEL - Abnormal; Notable for the following:    BUN 26 (*)    Creatinine, Ser 1.14 (*)  GFR calc non Af Amer 46 (*)    GFR calc Af Amer 53 (*)    All other components within normal limits  CBC - Abnormal; Notable for the following:    WBC 11.2 (*)    Hemoglobin 10.6 (*)    HCT 34.0 (*)    RDW 16.9 (*)    Platelets 427 (*)    All other components within normal limits  D-DIMER, QUANTITATIVE - Abnormal; Notable for the following:    D-Dimer, Quant 0.51 (*)    All other components within normal limits  PRO B NATRIURETIC PEPTIDE - Abnormal; Notable for the following:    Pro B Natriuretic peptide (BNP) 849.8 (*)    All other components within normal limits  I-STAT TROPOININ, ED   Imaging Review Dg Chest 2 View (if Patient Has Fever And/or Copd)  05/09/2013   CLINICAL DATA:  Shortness of Breath  EXAM: CHEST  2 VIEW  COMPARISON:  10/28/2010  FINDINGS: Cardiomediastinal silhouette is stable. Atherosclerotic calcifications of thoracic aorta again noted. No acute infiltrate or pleural effusion. No pulmonary edema. Stable hyperinflation and chronic mild interstitial prominence. Thoracic spine osteopenia.  IMPRESSION: No active cardiopulmonary disease. Again noted hyperinflation and chronic mild interstitial prominence.   Electronically Signed   By: Lahoma Crocker M.D.   On: 05/09/2013 17:00     EKG Interpretation   Date/Time:  Friday May 09 2013 16:25:18 EST Ventricular Rate:  56 PR Interval:  217 QRS Duration: 91 QT Interval:  451 QTC Calculation: 435 R Axis:   71 Text Interpretation:  Sinus rhythm Atrial premature complex Borderline  prolonged PR interval Probable anteroseptal infarct, old No significant  change since last tracing Confirmed by [facility]  MD, Jenny Reichmann ([id number]) on  05/09/2013  4:35:22 PM      MDM   Final diagnoses:  Dyspnea  Weakness    I doubt any other EMC precluding discharge at this time including, but not necessarily limited to the following:ACS, PE.    Babette Relic, MD 05/11/13 1420

## 2013-05-30 ENCOUNTER — Ambulatory Visit: Payer: Self-pay | Admitting: Cardiology

## 2013-06-16 ENCOUNTER — Other Ambulatory Visit: Payer: Self-pay | Admitting: *Deleted

## 2013-06-16 MED ORDER — FLECAINIDE ACETATE 50 MG PO TABS
50.0000 mg | ORAL_TABLET | Freq: Two times a day (BID) | ORAL | Status: DC
Start: 2013-06-16 — End: 2013-06-16

## 2013-06-16 MED ORDER — FLECAINIDE ACETATE 50 MG PO TABS: 50.0000 mg | ORAL_TABLET | Freq: Two times a day (BID) | ORAL | Status: DC

## 2013-06-20 ENCOUNTER — Encounter: Payer: Self-pay | Admitting: Internal Medicine

## 2013-06-20 ENCOUNTER — Ambulatory Visit (INDEPENDENT_AMBULATORY_CARE_PROVIDER_SITE_OTHER): Payer: Medicare Other | Admitting: Internal Medicine

## 2013-06-20 VITALS — BP 122/78 | HR 76 | Temp 97.2°F | Resp 16 | Wt 119.0 lb

## 2013-06-20 DIAGNOSIS — F411 Generalized anxiety disorder: Secondary | ICD-10-CM

## 2013-06-20 DIAGNOSIS — R0602 Shortness of breath: Secondary | ICD-10-CM

## 2013-06-20 DIAGNOSIS — J438 Other emphysema: Secondary | ICD-10-CM

## 2013-06-20 DIAGNOSIS — J449 Chronic obstructive pulmonary disease, unspecified: Secondary | ICD-10-CM

## 2013-06-20 DIAGNOSIS — M129 Arthropathy, unspecified: Secondary | ICD-10-CM

## 2013-06-20 MED ORDER — UMECLIDINIUM-VILANTEROL 62.5-25 MCG/INH IN AEPB: 1.0000 | INHALATION_SPRAY | Freq: Every day | RESPIRATORY_TRACT | Status: DC

## 2013-06-20 MED ORDER — FLUTICASONE FUROATE-VILANTEROL 100-25 MCG/INH IN AEPB: 1.0000 | INHALATION_SPRAY | Freq: Every day | RESPIRATORY_TRACT | Status: DC

## 2013-06-20 MED ORDER — ESTROGENS, CONJUGATED 0.625 MG/GM VA CREA: 1.0000 | TOPICAL_CREAM | Freq: Every day | VAGINAL | Status: DC

## 2013-06-20 NOTE — Assessment & Plan Note (Signed)
Worse. Start Anoro

## 2013-06-20 NOTE — Progress Notes (Signed)
Pre visit review using our clinic review tool, if applicable. No additional management support is needed unless otherwise documented below in the visit note. 

## 2013-06-20 NOTE — Assessment & Plan Note (Signed)
Continue with current prescription therapy as reflected on the Med list.  
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
## 2013-07-01 ENCOUNTER — Ambulatory Visit (INDEPENDENT_AMBULATORY_CARE_PROVIDER_SITE_OTHER): Payer: Medicare Other | Admitting: Cardiology

## 2013-07-01 ENCOUNTER — Ambulatory Visit: Payer: Self-pay | Admitting: Cardiology

## 2013-07-01 ENCOUNTER — Encounter: Payer: Self-pay | Admitting: Cardiology

## 2013-07-01 VITALS — BP 128/57 | HR 63 | Ht 65.0 in | Wt 119.8 lb

## 2013-07-01 DIAGNOSIS — I4891 Unspecified atrial fibrillation: Secondary | ICD-10-CM

## 2013-07-01 DIAGNOSIS — I10 Essential (primary) hypertension: Secondary | ICD-10-CM

## 2013-07-01 MED ORDER — FLECAINIDE ACETATE 50 MG PO TABS: 50.0000 mg | ORAL_TABLET | Freq: Two times a day (BID) | ORAL | Status: DC
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
## 2013-07-01 NOTE — Assessment & Plan Note (Signed)
Blood pressure controlled. Continue present medications. 

## 2013-07-01 NOTE — Patient Instructions (Signed)
Your physician wants you to follow-up in: ONE YEAR WITH DR CRENSHAW You will receive a reminder letter in the mail two months in advance. If you don't receive a letter, please call our office to schedule the follow-up appointment.  

## 2013-07-01 NOTE — Assessment & Plan Note (Signed)
Patient remains in sinus rhythm. Continue flecainide, beta blocker and aspirin. Patient declines any anticoagulation other than aspirin and understands the increased risk of CVA.

## 2013-07-18 ENCOUNTER — Other Ambulatory Visit: Payer: Self-pay

## 2013-07-18 MED ORDER — METOPROLOL TARTRATE 25 MG PO TABS: 12.5000 mg | ORAL_TABLET | Freq: Two times a day (BID) | ORAL | Status: DC

## 2013-08-29 ENCOUNTER — Other Ambulatory Visit: Payer: Self-pay | Admitting: Internal Medicine

## 2013-09-19 ENCOUNTER — Encounter: Payer: Self-pay | Admitting: Internal Medicine

## 2013-09-19 ENCOUNTER — Ambulatory Visit (INDEPENDENT_AMBULATORY_CARE_PROVIDER_SITE_OTHER): Payer: Medicare Other | Admitting: Internal Medicine

## 2013-09-19 VITALS — BP 128/68 | HR 72 | Temp 97.7°F | Resp 16 | Wt 124.0 lb

## 2013-09-19 DIAGNOSIS — M545 Low back pain, unspecified: Secondary | ICD-10-CM

## 2013-09-19 DIAGNOSIS — F3289 Other specified depressive episodes: Secondary | ICD-10-CM

## 2013-09-19 DIAGNOSIS — R634 Abnormal weight loss: Secondary | ICD-10-CM

## 2013-09-19 DIAGNOSIS — J449 Chronic obstructive pulmonary disease, unspecified: Secondary | ICD-10-CM

## 2013-09-19 DIAGNOSIS — M19042 Primary osteoarthritis, left hand: Secondary | ICD-10-CM

## 2013-09-19 DIAGNOSIS — M19041 Primary osteoarthritis, right hand: Secondary | ICD-10-CM

## 2013-09-19 DIAGNOSIS — F329 Major depressive disorder, single episode, unspecified: Secondary | ICD-10-CM

## 2013-09-19 DIAGNOSIS — M19049 Primary osteoarthritis, unspecified hand: Secondary | ICD-10-CM

## 2013-09-19 NOTE — Assessment & Plan Note (Signed)
Wt Readings from Last 3 Encounters:  09/19/13 124 lb (56.246 kg)  07/01/13 119 lb 12.8 oz (54.341 kg)  06/20/13 119 lb (53.978 kg)  better

## 2013-09-19 NOTE — Assessment & Plan Note (Signed)
Continue with current prescription therapy as reflected on the Med list.  
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
## 2013-09-19 NOTE — Assessment & Plan Note (Signed)
Discussed.

## 2013-09-19 NOTE — Progress Notes (Signed)
Pre visit review using our clinic review tool, if applicable. No additional management support is needed unless otherwise documented below in the visit note. 

## 2013-10-07 ENCOUNTER — Other Ambulatory Visit: Payer: Self-pay | Admitting: Internal Medicine

## 2013-10-10 NOTE — Telephone Encounter (Signed)
Notified pharmacy spoke with (Rob) gave md approval...lmb

## 2013-11-18 ENCOUNTER — Other Ambulatory Visit: Payer: Self-pay | Admitting: *Deleted

## 2013-11-18 DIAGNOSIS — I83893 Varicose veins of bilateral lower extremities with other complications: Secondary | ICD-10-CM

## 2013-11-20 ENCOUNTER — Other Ambulatory Visit: Payer: Self-pay | Admitting: Internal Medicine

## 2013-12-11 ENCOUNTER — Encounter: Payer: Self-pay | Admitting: Vascular Surgery

## 2013-12-12 ENCOUNTER — Other Ambulatory Visit: Payer: Self-pay | Admitting: Vascular Surgery

## 2013-12-12 ENCOUNTER — Ambulatory Visit (INDEPENDENT_AMBULATORY_CARE_PROVIDER_SITE_OTHER): Payer: Medicare Other | Admitting: Vascular Surgery

## 2013-12-12 ENCOUNTER — Encounter: Payer: Self-pay | Admitting: Vascular Surgery

## 2013-12-12 ENCOUNTER — Ambulatory Visit (HOSPITAL_COMMUNITY)
Admission: RE | Admit: 2013-12-12 | Discharge: 2013-12-12 | Disposition: A | Discharge status: Home / Self Care | Payer: Medicare Other | Source: Ambulatory Visit | Attending: Vascular Surgery | Admitting: Vascular Surgery

## 2013-12-12 VITALS — BP 161/46 | HR 51 | Ht 65.0 in | Wt 124.0 lb

## 2013-12-12 DIAGNOSIS — I83813 Varicose veins of bilateral lower extremities with pain: Secondary | ICD-10-CM

## 2013-12-12 DIAGNOSIS — I8393 Asymptomatic varicose veins of bilateral lower extremities: Secondary | ICD-10-CM
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
## 2013-12-15 ENCOUNTER — Other Ambulatory Visit: Payer: Self-pay | Admitting: Occupational Medicine

## 2013-12-15 ENCOUNTER — Ambulatory Visit: Payer: Self-pay

## 2013-12-15 DIAGNOSIS — R52 Pain, unspecified: Secondary | ICD-10-CM

## 2014-01-23 ENCOUNTER — Ambulatory Visit (INDEPENDENT_AMBULATORY_CARE_PROVIDER_SITE_OTHER): Payer: Medicare Other | Admitting: Internal Medicine

## 2014-01-23 ENCOUNTER — Encounter: Payer: Self-pay | Admitting: Internal Medicine

## 2014-01-23 VITALS — BP 140/80 | HR 61 | Temp 97.8°F | Wt 123.0 lb

## 2014-01-23 DIAGNOSIS — I10 Essential (primary) hypertension: Secondary | ICD-10-CM

## 2014-01-23 DIAGNOSIS — R634 Abnormal weight loss: Secondary | ICD-10-CM

## 2014-01-23 DIAGNOSIS — J449 Chronic obstructive pulmonary disease, unspecified: Secondary | ICD-10-CM

## 2014-01-23 DIAGNOSIS — F411 Generalized anxiety disorder: Secondary | ICD-10-CM

## 2014-01-23 MED ORDER — LORAZEPAM 1 MG PO TABS: 1.0000 mg | ORAL_TABLET | Freq: Every evening | ORAL | Status: DC | PRN

## 2014-01-23 MED ORDER — CLONAZEPAM 0.5 MG PO TABS: 0.5000 mg | ORAL_TABLET | Freq: Every day | ORAL | Status: DC

## 2014-01-23 NOTE — Assessment & Plan Note (Signed)
Wt Readings from Last 3 Encounters:  01/23/14 123 lb (55.792 kg)  12/12/13 124 lb (56.246 kg)  09/19/13 124 lb (56.246 kg)
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
## 2014-01-23 NOTE — Assessment & Plan Note (Signed)
Continue with current prescription therapy as reflected on the Med list.  

## 2014-01-23 NOTE — Assessment & Plan Note (Signed)
Sub-optimal Continue with current prescription therapy as reflected on the Med list.

## 2014-01-23 NOTE — Progress Notes (Signed)
Pre visit review using our clinic review tool, if applicable. No additional management support is needed unless otherwise documented below in the visit note. 

## 2014-01-23 NOTE — Assessment & Plan Note (Signed)
Continue with current  therapy as reflected on the Med list.

## 2014-02-17 ENCOUNTER — Ambulatory Visit: Payer: Self-pay | Admitting: Internal Medicine

## 2014-02-19 ENCOUNTER — Ambulatory Visit (INDEPENDENT_AMBULATORY_CARE_PROVIDER_SITE_OTHER): Payer: Medicare Other | Admitting: Internal Medicine

## 2014-02-19 ENCOUNTER — Encounter: Payer: Self-pay | Admitting: Internal Medicine

## 2014-02-19 VITALS — BP 120/70 | HR 61 | Temp 97.6°F | Wt 125.0 lb

## 2014-02-19 DIAGNOSIS — R1314 Dysphagia, pharyngoesophageal phase: Secondary | ICD-10-CM

## 2014-02-19 DIAGNOSIS — J310 Chronic rhinitis: Secondary | ICD-10-CM

## 2014-02-19 DIAGNOSIS — J209 Acute bronchitis, unspecified: Secondary | ICD-10-CM

## 2014-02-19 DIAGNOSIS — H6122 Impacted cerumen, left ear: Secondary | ICD-10-CM

## 2014-02-19 MED ORDER — CEFUROXIME AXETIL 250 MG PO TABS: 250.0000 mg | ORAL_TABLET | Freq: Two times a day (BID) | ORAL | Status: DC

## 2014-02-19 NOTE — Patient Instructions (Signed)
  Flonase OR Nasacort AQ 1 spray in each nostril twice a day as needed. Use the "crossover" technique as discussed.   Please do not use Q-tips as we discussed. Should wax build up occur, please put 2-3 drops of mineral oil in the affected  ear at night to soften the wax .Cover the canal with a  cotton ball to prevent the oil from staining bed linens. In the morning fill the ear canal with hydrogen peroxide & lie in the opposite lateral decubitus position(on the side opposite the affected ear)  for 10-15 minutes. After allowing this period of time for the peroxide to dissolve the wax ;shower and use the thinnest washrag available to wick out the wax. If both ears are involved ; alternate this treatment from ear to ear each night until no wax is found on the washrag.  Reflux of gastric acid may be asymptomatic as this may occur mainly during sleep.The triggers for reflux  include stress; the "aspirin family" ; alcohol; peppermint; and caffeine (coffee, tea, cola, and chocolate). The aspirin family would include aspirin and the nonsteroidal agents such as ibuprofen &  Naproxen. Tylenol would not cause reflux. If having symptoms ; food & drink should be avoided for @ least 2 hours before going to bed.

## 2014-02-19 NOTE — Progress Notes (Signed)
Pre visit review using our clinic review tool, if applicable. No additional management support is needed unless otherwise documented below in the visit note. 

## 2014-02-19 NOTE — Progress Notes (Signed)
   Subjective:    Patient ID: [name], female    DOB: 09/17/1935, 78 y.o.   MRN: [id number]  HPI She describes bitemporal headaches for 7 days associated with dizziness. She thinks that the headaches are related to tension  Symptoms are worse with neck flexion  She does have rhinitis with clear drainage also.  She also has cough with yellow  Sputum .She has chronic dyspnea in the context of COPD. She quit smoking 10 years ago.  She also has chronic hearing loss. She has chronic issues with wax accumulation. Apparently she had pain when it was removed one occasion and does not want the procedure repeated  She comments that she does not want to take pain medicines.  She describes lightheadedness without frank vertigo.  There's been no cardiac or neuro prodrome prior to the symptoms  She has no benign positional vertigo  Symptoms are not aggravated by standing up or rotating her head laterally.  She does describe chronic cardiac irregularity. She's on Tambocor.    Review of Systems  She has no numbness, tingling, weakness in extremities  She has no fever, chills, or sweats  She denies any tinnitus, earache, otic discharge.  She has no blurred vision, double vision, loss of vision  She has had some slight imbalance with walking.  She states she's had an ulcer for 20 years and describes dysphagia with every meal. She is on omeprazole daily.  She's been taking ibuprofen twice a day for the headaches.     Objective:   Physical Exam  Positive or pertinent findings include: She is very thin but appears adequately nourished Despite wearing hearing aid on the right,her hearing is still markedly decreased Wax occludes left otic canal. She has an upper plate and lower partial Heart sounds are distant; subtle rhythm irregularity. Breath sounds are markedly decreased She has mixed DIP/PIP arthritic change in the hands with decreased grip. Deep tendon reflexes are  normal.  General appearance : in no distress. Eyes: No conjunctival inflammation or scleral icterus is present. Oral exam: Lips and gums are healthy appearing.There is no oropharyngeal erythema or exudate noted.  Heart:  Normal rate . S1 and S2 normal without gallop, murmur, click, rub or other extra sounds   Lungs: no wheezes, rhonchi,rales ,or rubs present.No increased work of breathing.               Vascular : acarotid pulses equal ; no bruits present. Skin:Warm & dry.  Intact without suspicious lesions or rashes ; no jaundice or tenting Lymphatic: No lymphadenopathy is noted about the head, neck, axilla.          Assessment & Plan:  #1 bronchitis  #2 bitemporal headache  #3 rhinitis  #4 wax impaction on the left  #5 subjective dysphagia with each meal.  Plan: See orders and recommendations. If symptoms persist; ENT referral recommended for #4  GI referral

## 2014-03-04 ENCOUNTER — Telehealth: Payer: Self-pay | Admitting: Internal Medicine

## 2014-03-04 NOTE — Telephone Encounter (Signed)
Notified pt with md response.../lmb 

## 2014-03-04 NOTE — Telephone Encounter (Signed)
Use over-the-counter  "cold" medicines  such as  "Afrin" nasal spray for nasal congestion as directed instead. Use" Delsym" or" Robitussin" cough syrup varietis for cough.  You can use plain "Tylenol" or "Advi"l for fever, chills and achyness.   "Common cold" symptoms are usually triggered by a virus.  The antibiotics are usually not necessary. On average, a" viral cold" illness would take 4-7 days to resolve. Please, make an appointment if you are not better or if you're worse.  

## 2014-03-04 NOTE — Telephone Encounter (Signed)
Pt seen 12/17 for cold type symptoms, pt has headache, no fever and severe sore throat. Pt requesting something be called in if possible as she was recently seen.  [phone] pls call pt.

## 2014-03-09 ENCOUNTER — Other Ambulatory Visit: Payer: Self-pay | Admitting: Internal Medicine

## 2014-03-10 ENCOUNTER — Telehealth: Payer: Self-pay | Admitting: Internal Medicine

## 2014-03-10 NOTE — Telephone Encounter (Signed)
Pt requesting medication, losing her voice, cough, coughing up yellow phlegm, sore throat, no fever, X2 wks. Pt offered appt refused to see any MD other than Dr Alain Marion, work in ? meds called in? pls advise.  [phone]

## 2014-03-10 NOTE — Telephone Encounter (Signed)
Notified pt with md response. Made appt for tomorrow @ 10:15am.../lmb

## 2014-03-10 NOTE — Telephone Encounter (Signed)
pls sch w/any provider avail Thx

## 2014-03-11 ENCOUNTER — Ambulatory Visit (INDEPENDENT_AMBULATORY_CARE_PROVIDER_SITE_OTHER)
Admission: RE | Admit: 2014-03-11 | Discharge: 2014-03-11 | Disposition: A | Discharge status: Home / Self Care | Payer: Medicare Other | Source: Ambulatory Visit | Attending: Internal Medicine | Admitting: Internal Medicine

## 2014-03-11 ENCOUNTER — Ambulatory Visit (INDEPENDENT_AMBULATORY_CARE_PROVIDER_SITE_OTHER): Payer: Medicare Other | Admitting: Internal Medicine

## 2014-03-11 ENCOUNTER — Encounter: Payer: Self-pay | Admitting: Internal Medicine

## 2014-03-11 VITALS — BP 190/82 | HR 73 | Temp 97.7°F | Wt 121.0 lb

## 2014-03-11 DIAGNOSIS — I517 Cardiomegaly: Secondary | ICD-10-CM | POA: Diagnosis not present

## 2014-03-11 DIAGNOSIS — J209 Acute bronchitis, unspecified: Secondary | ICD-10-CM | POA: Diagnosis not present

## 2014-03-11 DIAGNOSIS — J449 Chronic obstructive pulmonary disease, unspecified: Secondary | ICD-10-CM

## 2014-03-11 MED ORDER — HYDROCOD POLST-CPM POLST ER 10-8 MG PO CP12
ORAL_CAPSULE | ORAL | Status: DC
Start: 2014-03-11 — End: 2014-12-14

## 2014-03-11 MED ORDER — LEVOFLOXACIN 500 MG PO TABS: 500.0000 mg | ORAL_TABLET | Freq: Every day | ORAL | Status: DC

## 2014-03-11 NOTE — Assessment & Plan Note (Signed)
Levaquin 500 mg qd

## 2014-03-11 NOTE — Progress Notes (Signed)
Pre visit review using our clinic review tool, if applicable. No additional management support is needed unless otherwise documented below in the visit note. 
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
## 2014-03-11 NOTE — Assessment & Plan Note (Signed)
Tussiocaps prn CXR

## 2014-03-11 NOTE — Patient Instructions (Signed)
To work 03/15/14 if ok

## 2014-03-18 ENCOUNTER — Telehealth: Payer: Self-pay | Admitting: Internal Medicine

## 2014-03-18 DIAGNOSIS — R5381 Other malaise: Secondary | ICD-10-CM

## 2014-03-18 DIAGNOSIS — I10 Essential (primary) hypertension: Secondary | ICD-10-CM

## 2014-03-18 NOTE — Telephone Encounter (Signed)
Patient states she has been sick for two weeks.  She states she has finished her antibiotic and still feels bad.  She is requesting to have blood work done.

## 2014-03-19 ENCOUNTER — Encounter: Payer: Self-pay | Admitting: Internal Medicine

## 2014-03-19 NOTE — Telephone Encounter (Signed)
Labs entered. Pt informed

## 2014-03-19 NOTE — Telephone Encounter (Signed)
OK CBC, UA, CMET TSH Thx

## 2014-03-20 ENCOUNTER — Other Ambulatory Visit (INDEPENDENT_AMBULATORY_CARE_PROVIDER_SITE_OTHER): Payer: Medicare Other

## 2014-03-20 DIAGNOSIS — R5381 Other malaise: Secondary | ICD-10-CM | POA: Diagnosis not present

## 2014-03-20 DIAGNOSIS — I10 Essential (primary) hypertension: Secondary | ICD-10-CM | POA: Diagnosis not present

## 2014-03-20 LAB — CBC WITH DIFFERENTIAL/PLATELET
Basophils Absolute: 0.1 10*3/uL (ref 0.0–0.1)
Basophils Relative: 0.6 % (ref 0.0–3.0)
Eosinophils Absolute: 0.3 10*3/uL (ref 0.0–0.7)
Eosinophils Relative: 2.6 % (ref 0.0–5.0)
HCT: 36.9 % (ref 36.0–46.0)
Hemoglobin: 11.5 g/dL — ABNORMAL LOW (ref 12.0–15.0)
Lymphocytes Relative: 28.5 % (ref 12.0–46.0)
Lymphs Abs: 3.5 10*3/uL (ref 0.7–4.0)
MCHC: 31.2 g/dL (ref 30.0–36.0)
MCV: 88.6 fl (ref 78.0–100.0)
Monocytes Absolute: 1.2 10*3/uL — ABNORMAL HIGH (ref 0.1–1.0)
Monocytes Relative: 9.7 % (ref 3.0–12.0)
Neutro Abs: 7.3 10*3/uL (ref 1.4–7.7)
Neutrophils Relative %: 58.6 % (ref 43.0–77.0)
Platelets: 485 10*3/uL — ABNORMAL HIGH (ref 150.0–400.0)
RBC: 4.16 Mil/uL (ref 3.87–5.11)
RDW: 17.5 % — ABNORMAL HIGH (ref 11.5–15.5)
WBC: 12.4 10*3/uL — ABNORMAL HIGH (ref 4.0–10.5)

## 2014-03-20 LAB — COMPREHENSIVE METABOLIC PANEL
ALT: 9 U/L (ref 0–35)
AST: 20 U/L (ref 0–37)
Albumin: 3.4 g/dL — ABNORMAL LOW (ref 3.5–5.2)
Alkaline Phosphatase: 67 U/L (ref 39–117)
BUN: 26 mg/dL — ABNORMAL HIGH (ref 6–23)
CO2: 26 mEq/L (ref 19–32)
Calcium: 9.2 mg/dL (ref 8.4–10.5)
Chloride: 109 mEq/L (ref 96–112)
Creatinine, Ser: 0.96 mg/dL (ref 0.40–1.20)
GFR: 60.03 mL/min (ref >60.00)
Glucose, Bld: 70 mg/dL (ref 70–99)
Potassium: 4.7 mEq/L (ref 3.5–5.1)
Sodium: 142 mEq/L (ref 135–145)
Total Bilirubin: 0.3 mg/dL (ref 0.2–1.2)
Total Protein: 7.1 g/dL (ref 6.0–8.3)

## 2014-03-20 LAB — URINALYSIS, ROUTINE W REFLEX MICROSCOPIC
Bilirubin Urine: NEGATIVE
Hgb urine dipstick: NEGATIVE
Ketones, ur: NEGATIVE
Nitrite: NEGATIVE
Specific Gravity, Urine: >=1.03 — AB (ref 1.000–1.030)
Total Protein, Urine: NEGATIVE
Urine Glucose: NEGATIVE
Urobilinogen, UA: 0.2 (ref 0.0–1.0)
pH: 5.5 (ref 5.0–8.0)

## 2014-03-20 LAB — TSH: TSH: 1.19 u[IU]/mL (ref 0.35–4.50)

## 2014-03-23 ENCOUNTER — Telehealth: Payer: Self-pay | Admitting: *Deleted

## 2014-03-23 NOTE — Telephone Encounter (Signed)
Patient stated that she does have a terrible yeast infection and she is unable to use cream. Patient is requesting pill form for yeast infection. Please advise?

## 2014-03-23 NOTE — Telephone Encounter (Signed)
-----   Message from Cassandria Anger, MD sent at 03/21/2014 10:46 AM EST ----- Erline Levine, please, inform patient that all labs are normal except for abn UA - no need to treat if no urinary sx's Thx

## 2014-03-24 MED ORDER — FLUCONAZOLE 150 MG PO TABS: 150.0000 mg | ORAL_TABLET | Freq: Once | ORAL | Status: DC

## 2014-03-24 NOTE — Telephone Encounter (Signed)
Ok Diflucan PO - emailed Thx

## 2014-05-26 ENCOUNTER — Encounter: Payer: Self-pay | Admitting: Internal Medicine

## 2014-05-26 ENCOUNTER — Ambulatory Visit (INDEPENDENT_AMBULATORY_CARE_PROVIDER_SITE_OTHER): Payer: Medicare Other | Admitting: Internal Medicine

## 2014-05-26 VITALS — BP 140/74 | HR 63 | Temp 97.6°F | Wt 123.0 lb

## 2014-05-26 DIAGNOSIS — N952 Postmenopausal atrophic vaginitis: Secondary | ICD-10-CM | POA: Insufficient documentation

## 2014-05-26 DIAGNOSIS — E538 Deficiency of other specified B group vitamins: Secondary | ICD-10-CM | POA: Diagnosis not present

## 2014-05-26 DIAGNOSIS — M5440 Lumbago with sciatica, unspecified side: Secondary | ICD-10-CM

## 2014-05-26 DIAGNOSIS — F411 Generalized anxiety disorder: Secondary | ICD-10-CM

## 2014-05-26 DIAGNOSIS — J449 Chronic obstructive pulmonary disease, unspecified: Secondary | ICD-10-CM | POA: Diagnosis not present

## 2014-05-26 MED ORDER — FLUCONAZOLE 150 MG PO TABS: 150.0000 mg | ORAL_TABLET | Freq: Once | ORAL | Status: DC

## 2014-05-26 MED ORDER — ESTRADIOL 0.1 MG/GM VA CREA: 1.0000 | TOPICAL_CREAM | Freq: Every day | VAGINAL | Status: DC

## 2014-05-26 MED ORDER — CLONAZEPAM 0.5 MG PO TABS: 0.5000 mg | ORAL_TABLET | Freq: Every day | ORAL | Status: DC

## 2014-05-26 NOTE — Assessment & Plan Note (Signed)
Cont B12 po

## 2014-05-26 NOTE — Assessment & Plan Note (Signed)
OA Tylenol prn

## 2014-05-26 NOTE — Assessment & Plan Note (Signed)
Chronic Estarce vag cream

## 2014-05-26 NOTE — Assessment & Plan Note (Signed)
On Clonazepam prn  Potential benefits of a long term benzodiazepines  use as well as potential risks  and complications were explained to the patient and were aknowledged. 
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
## 2014-05-26 NOTE — Assessment & Plan Note (Signed)
Inhalers are too expensive. Pt was offered a [facility] Rx - she will think about it

## 2014-05-26 NOTE — Progress Notes (Signed)
Pre visit review using our clinic review tool, if applicable. No additional management support is needed unless otherwise documented below in the visit note. 

## 2014-06-05 ENCOUNTER — Other Ambulatory Visit: Payer: Self-pay | Admitting: Internal Medicine

## 2014-06-05 NOTE — Telephone Encounter (Signed)
Patient called and needs this prescription LORazepam (ATIVAN) 1 MG tablet [id number]. Although not to [facility]. Her pharmacy is [facility] on [address].

## 2014-06-08 MED ORDER — LORAZEPAM 1 MG PO TABS: ORAL_TABLET | ORAL | Status: DC

## 2014-06-08 NOTE — Telephone Encounter (Signed)
Reprinted rx faxed script to [facility] per pt request.../lmb

## 2014-06-08 NOTE — Addendum Note (Signed)
Addended by: Earnstine Regal on: 06/08/2014 08:54 AM   Modules accepted: Orders

## 2014-06-18 DIAGNOSIS — R351 Nocturia: Secondary | ICD-10-CM | POA: Diagnosis not present

## 2014-06-18 DIAGNOSIS — N393 Stress incontinence (female) (male): Secondary | ICD-10-CM | POA: Diagnosis not present

## 2014-06-18 DIAGNOSIS — N760 Acute vaginitis: Secondary | ICD-10-CM | POA: Diagnosis not present

## 2014-07-02 DIAGNOSIS — H1045 Other chronic allergic conjunctivitis: Secondary | ICD-10-CM | POA: Diagnosis not present

## 2014-07-11 ENCOUNTER — Other Ambulatory Visit: Payer: Self-pay | Admitting: Cardiology

## 2014-07-12 ENCOUNTER — Emergency Department (INDEPENDENT_AMBULATORY_CARE_PROVIDER_SITE_OTHER)
Admission: EM | Admit: 2014-07-12 | Discharge: 2014-07-12 | Disposition: A | Discharge status: Home / Self Care | Payer: Medicare Other | Source: Home / Self Care | Attending: Family Medicine | Admitting: Family Medicine

## 2014-07-12 ENCOUNTER — Emergency Department (INDEPENDENT_AMBULATORY_CARE_PROVIDER_SITE_OTHER): Payer: Medicare Other

## 2014-07-12 DIAGNOSIS — S59911A Unspecified injury of right forearm, initial encounter: Secondary | ICD-10-CM | POA: Diagnosis not present

## 2014-07-12 DIAGNOSIS — S52121A Displaced fracture of head of right radius, initial encounter for closed fracture: Secondary | ICD-10-CM

## 2014-07-12 DIAGNOSIS — M79631 Pain in right forearm: Secondary | ICD-10-CM | POA: Diagnosis not present

## 2014-07-12 NOTE — Discharge Instructions (Signed)
Wear splint and sling and use ice and advil as needed for pain, see orthopedist on wed for recheck.

## 2014-07-12 NOTE — ED Provider Notes (Addendum)
CSN: [id number]     Arrival date & time 07/12/14  1700 History   First MD Initiated Contact with Patient 07/12/14 1733     Chief Complaint  Patient presents with  . Arm Pain   (Consider location/radiation/quality/duration/timing/severity/associated sxs/prior Treatment) Patient is a 79 y.o. female presenting with arm injury. The history is provided by the patient.  Arm Injury Location:  Elbow Time since incident:  1 hour Injury: yes   Mechanism of injury: fall   Mechanism of injury comment:  Tripped over curb at [employer]. Elbow location:  R elbow Pain details:    Severity:  Mild   Onset quality:  Sudden Chronicity:  New Dislocation: no   Foreign body present:  No foreign bodies Prior injury to area:  No Worsened by:  Nothing tried Associated symptoms: decreased range of motion   Associated symptoms: no back pain, no neck pain, no numbness and no tingling     Past Medical History  Diagnosis Date  . Atrial fibrillation   . Arthritis   . Hypertension   . B12 deficiency   . Depression   . Anxiety   . GERD (gastroesophageal reflux disease)   . Vitamin D deficiency disease   . COPD (chronic obstructive pulmonary disease)   . Pneumonia   . Peptic ulcer, unspecified site, unspecified as acute or chronic, without mention of hemorrhage, perforation, or obstruction   . Osteoarthrosis, hand     both hands   Past Surgical History  Procedure Laterality Date  . Bladder surgery      Bladder tack and intestines  . Splenectomy    . Cystocele repair     Family History  Problem Relation Age of Onset  . Arthritis      Family history of  . Coronary artery disease      Family history of  . Hypertension      Family history of  . Heart disease Father    History  Substance Use Topics  . Smoking status: Former Research scientist (life sciences)  . Smokeless tobacco: Never Used  . Alcohol Use: No   OB History    No data available     Review of Systems  Constitutional: Negative.   Gastrointestinal:  Negative.   Musculoskeletal: Negative for back pain, gait problem and neck pain.    Allergies  Amoxicillin; Codeine; and Other  Home Medications   Prior to Admission medications   Medication Sig Start Date End Date Taking? Authorizing Provider  aspirin 81 MG chewable tablet Chew 81 mg by mouth 2 (two) times daily.     Historical Provider, MD  Aspirin-Salicylamide-Caffeine (BC HEADACHE PO) Take 1 packet by mouth as needed (pain).    Historical Provider, MD  Cholecalciferol ([employer] VITAMIN D3) 1000 UNITS capsule Take 1,000 Units by mouth daily.      Historical Provider, MD  clonazePAM (KLONOPIN) 0.5 MG tablet Take 1-2 tablets (0.5-1 mg total) by mouth at bedtime. 05/26/14   Aleksei Plotnikov V, MD  estradiol (ESTRACE) 0.1 MG/GM vaginal cream Place 1 Applicatorful vaginally at bedtime. 05/26/14   Aleksei Plotnikov V, MD  flecainide (TAMBOCOR) 50 MG tablet Take 1 tablet (50 mg total) by mouth 2 (two) times daily. 07/01/13   Lelon Perla, MD  fluconazole (DIFLUCAN) 150 MG tablet Take 1 tablet (150 mg total) by mouth once. 05/26/14   Cassandria Anger, MD  Hydrocod Polst-Chlorphen Polst 10-8 MG CP12 1 po bid prn cough 03/11/14   Cassandria Anger, MD  lactose free nutrition (  BOOST) LIQD Take 237 mLs by mouth daily with breakfast.    Historical Provider, MD  LORazepam (ATIVAN) 1 MG tablet TAKE 1 OR 2 TABLETS BY MOUTH AT BEDTIME AS NEEDED FOR SLEEP OR ANXIETY 06/08/14   Aleksei Plotnikov V, MD  metoprolol tartrate (LOPRESSOR) 25 MG tablet Take 0.5 tablets (12.5 mg total) by mouth 2 (two) times daily. 07/18/13   Lelon Perla, MD  omeprazole (PRILOSEC) 20 MG capsule Take 20 mg by mouth daily.    Historical Provider, MD  Polyethyl Glycol-Propyl Glycol (SYSTANE OP) Place 1 drop into both eyes daily as needed (dry eyes).    Historical Provider, MD  Umeclidinium-Vilanterol (ANORO ELLIPTA) 62.5-25 MCG/INH AEPB Inhale 1 Act into the lungs daily. 06/20/13   Aleksei Plotnikov V, MD  vitamin B-12  (CYANOCOBALAMIN) 1000 MCG tablet Take 500 mcg by mouth daily.     Historical Provider, MD   BP 140/75 mmHg  Pulse 75  Resp 16  SpO2 96% Physical Exam  Constitutional: She is oriented to person, place, and time. She appears well-developed and well-nourished.  HENT:  Head: Normocephalic and atraumatic.  Neck: Normal range of motion. Neck supple.  Musculoskeletal: She exhibits tenderness.       Right elbow: She exhibits decreased range of motion, swelling and effusion. She exhibits no deformity. Tenderness found. Radial head and lateral epicondyle tenderness noted. No olecranon process tenderness noted.  Neurological: She is alert and oriented to person, place, and time.  Skin: Skin is warm and dry.  Nursing note and vitals reviewed.   ED Course  Procedures (including critical care time) Labs Review Labs Reviewed - No data to display  Imaging Review Dg Forearm Right  07/12/2014   CLINICAL DATA:  [place] onto RIGHT arm today, pain throughout RIGHT forearm greatest proximally particularly with supination, initial encounter  EXAM: RIGHT FOREARM - 2 VIEW  COMPARISON:  06/25/2010  FINDINGS: Significant diffuse osseous demineralization.  Joint spaces preserved.  Probable subtle nondisplaced radial neck fracture.  Elbow joint effusion present.  No additional fracture, dislocation or bone destruction.  IMPRESSION: Probable subtle nondisplaced RIGHT radial neck fracture with associated elbow joint effusion.   Electronically Signed   By: Lavonia Dana M.D.   On: 07/12/2014 17:39    X-rays reviewed and report per radiologist.  MDM   1. Radial head fracture, closed, right, initial encounter        Billy Fischer, MD 07/12/14 1754  Billy Fischer, MD 07/12/14 [phone]

## 2014-07-12 NOTE — ED Notes (Signed)
Right arm pain. Pt fell today at [employer].

## 2014-07-13 NOTE — Telephone Encounter (Signed)
Rx refill sent to patient pharmacy   

## 2014-07-14 DIAGNOSIS — S52111A Torus fracture of upper end of right radius, initial encounter for closed fracture: Secondary | ICD-10-CM | POA: Diagnosis not present

## 2014-07-14 DIAGNOSIS — S52111D Torus fracture of upper end of right radius, subsequent encounter for fracture with routine healing: Secondary | ICD-10-CM | POA: Diagnosis not present
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
## 2014-07-24 ENCOUNTER — Encounter: Payer: Self-pay | Admitting: Cardiology

## 2014-07-28 DIAGNOSIS — Z13820 Encounter for screening for osteoporosis: Secondary | ICD-10-CM | POA: Diagnosis not present

## 2014-07-28 DIAGNOSIS — Z780 Asymptomatic menopausal state: Secondary | ICD-10-CM | POA: Diagnosis not present

## 2014-08-06 DIAGNOSIS — S52111D Torus fracture of upper end of right radius, subsequent encounter for fracture with routine healing: Secondary | ICD-10-CM | POA: Diagnosis not present

## 2014-08-11 ENCOUNTER — Other Ambulatory Visit: Payer: Self-pay | Admitting: Cardiology

## 2014-08-11 NOTE — Telephone Encounter (Signed)
Rx has been sent to the pharmacy electronically. ° °

## 2014-08-14 ENCOUNTER — Other Ambulatory Visit: Payer: Self-pay | Admitting: Cardiology

## 2014-08-28 ENCOUNTER — Encounter: Payer: Self-pay | Admitting: Internal Medicine

## 2014-08-28 ENCOUNTER — Ambulatory Visit (INDEPENDENT_AMBULATORY_CARE_PROVIDER_SITE_OTHER): Payer: Medicare Other | Admitting: Internal Medicine

## 2014-08-28 VITALS — BP 132/80 | HR 60 | Wt 127.0 lb

## 2014-08-28 DIAGNOSIS — I10 Essential (primary) hypertension: Secondary | ICD-10-CM | POA: Diagnosis not present

## 2014-08-28 DIAGNOSIS — I480 Paroxysmal atrial fibrillation: Secondary | ICD-10-CM

## 2014-08-28 DIAGNOSIS — R634 Abnormal weight loss: Secondary | ICD-10-CM

## 2014-08-28 DIAGNOSIS — E538 Deficiency of other specified B group vitamins: Secondary | ICD-10-CM

## 2014-08-28 DIAGNOSIS — J449 Chronic obstructive pulmonary disease, unspecified: Secondary | ICD-10-CM

## 2014-08-28 DIAGNOSIS — J4489 Other specified chronic obstructive pulmonary disease: Secondary | ICD-10-CM

## 2014-08-28 DIAGNOSIS — F411 Generalized anxiety disorder: Secondary | ICD-10-CM

## 2014-08-28 MED ORDER — LORAZEPAM 1 MG PO TABS: ORAL_TABLET | ORAL | Status: DC

## 2014-08-28 MED ORDER — CLONAZEPAM 0.5 MG PO TABS: 0.5000 mg | ORAL_TABLET | Freq: Every day | ORAL | Status: DC

## 2014-08-28 NOTE — Progress Notes (Signed)
Pre visit review using our clinic review tool, if applicable. No additional management support is needed unless otherwise documented below in the visit note. 
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
## 2014-08-28 NOTE — Assessment & Plan Note (Signed)
Lopressor

## 2014-08-28 NOTE — Assessment & Plan Note (Signed)
Better Wt Readings from Last 3 Encounters:  08/28/14 127 lb (57.607 kg)  05/26/14 123 lb (55.792 kg)  03/11/14 121 lb (54.885 kg)

## 2014-08-28 NOTE — Assessment & Plan Note (Signed)
On B12 

## 2014-08-28 NOTE — Assessment & Plan Note (Addendum)
Anoro qd Pt was offered a [facility] Rx

## 2014-08-28 NOTE — Assessment & Plan Note (Signed)
On Clonazepam prn. Not to take w/Lorazepam   Potential benefits of a long term benzodiazepines  use as well as potential risks  and complications were explained to the patient and were aknowledged. 

## 2014-08-28 NOTE — Assessment & Plan Note (Signed)
on Lopressor

## 2014-08-31 DIAGNOSIS — S52111D Torus fracture of upper end of right radius, subsequent encounter for fracture with routine healing: Secondary | ICD-10-CM | POA: Diagnosis not present

## 2014-09-14 ENCOUNTER — Emergency Department (HOSPITAL_COMMUNITY)
Admission: EM | Admit: 2014-09-14 | Discharge: 2014-09-14 | Disposition: A | Discharge status: Home / Self Care | Payer: Medicare Other | Source: Home / Self Care

## 2014-09-14 DIAGNOSIS — S52521D Torus fracture of lower end of right radius, subsequent encounter for fracture with routine healing: Secondary | ICD-10-CM | POA: Diagnosis not present

## 2014-09-16 ENCOUNTER — Encounter (HOSPITAL_COMMUNITY): Payer: Self-pay | Admitting: *Deleted

## 2014-09-16 ENCOUNTER — Emergency Department (INDEPENDENT_AMBULATORY_CARE_PROVIDER_SITE_OTHER)
Admission: EM | Admit: 2014-09-16 | Discharge: 2014-09-16 | Disposition: A | Discharge status: Home / Self Care | Payer: Medicare Other | Source: Home / Self Care | Attending: Family Medicine | Admitting: Family Medicine

## 2014-09-16 ENCOUNTER — Other Ambulatory Visit: Payer: Self-pay | Admitting: Internal Medicine

## 2014-09-16 DIAGNOSIS — H6122 Impacted cerumen, left ear: Secondary | ICD-10-CM | POA: Diagnosis not present

## 2014-09-16 NOTE — Discharge Instructions (Signed)
Return as needed

## 2014-09-16 NOTE — ED Provider Notes (Signed)
CSN: [id number]     Arrival date & time 09/16/14  1548 History   First MD Initiated Contact with Patient 09/16/14 1620     Chief Complaint  Patient presents with  . Cerumen Impaction   (Consider location/radiation/quality/duration/timing/severity/associated sxs/prior Treatment) Patient is a 79 y.o. female presenting with plugged ear sensation. The history is provided by the patient.  Ear Fullness This is a new problem. The current episode started yesterday. The problem has been gradually worsening. Associated symptoms comments: Decreased hearing on left  .    Past Medical History  Diagnosis Date  . Atrial fibrillation   . Arthritis   . Hypertension   . B12 deficiency   . Depression   . Anxiety   . GERD (gastroesophageal reflux disease)   . Vitamin D deficiency disease   . COPD (chronic obstructive pulmonary disease)   . Pneumonia   . Peptic ulcer, unspecified site, unspecified as acute or chronic, without mention of hemorrhage, perforation, or obstruction   . Osteoarthrosis, hand     both hands   Past Surgical History  Procedure Laterality Date  . Bladder surgery      Bladder tack and intestines  . Splenectomy    . Cystocele repair     Family History  Problem Relation Age of Onset  . Arthritis      Family history of  . Coronary artery disease      Family history of  . Hypertension      Family history of  . Heart disease Father    History  Substance Use Topics  . Smoking status: Former Research scientist (life sciences)  . Smokeless tobacco: Never Used  . Alcohol Use: No   OB History    No data available     Review of Systems  Constitutional: Negative.   HENT: Positive for hearing loss. Negative for ear discharge and ear pain.     Allergies  Amoxicillin; Codeine; and Other  Home Medications   Prior to Admission medications   Medication Sig Start Date End Date Taking? Authorizing Provider  aspirin 81 MG chewable tablet Chew 81 mg by mouth 2 (two) times daily.     Historical  Provider, MD  Aspirin-Salicylamide-Caffeine (BC HEADACHE PO) Take 1 packet by mouth as needed (pain).    Historical Provider, MD  Cholecalciferol ([employer] VITAMIN D3) 1000 UNITS capsule Take 1,000 Units by mouth daily.      Historical Provider, MD  clonazePAM (KLONOPIN) 0.5 MG tablet Take 1-2 tablets (0.5-1 mg total) by mouth at bedtime. 08/28/14   Aleksei Plotnikov V, MD  flecainide (TAMBOCOR) 50 MG tablet Take 1 tablet (50 mg total) by mouth 2 (two) times daily. 08/11/14   Lelon Perla, MD  fluconazole (DIFLUCAN) 150 MG tablet Take 1 tablet (150 mg total) by mouth once. Patient not taking: Reported on 08/28/2014 05/26/14   Cassandria Anger, MD  Hydrocod Polst-Chlorphen Polst 10-8 MG CP12 1 po bid prn cough Patient not taking: Reported on 08/28/2014 03/11/14   Cassandria Anger, MD  lactose free nutrition (BOOST) LIQD Take 237 mLs by mouth daily with breakfast.    Historical Provider, MD  LORazepam (ATIVAN) 1 MG tablet TAKE 1 OR 2 TABLETS BY MOUTH AT BEDTIME AS NEEDED FOR SLEEP 08/28/14   Aleksei Plotnikov V, MD  metoprolol tartrate (LOPRESSOR) 25 MG tablet TAKE 1/2 TABLET BY MOUTH TWICE DAILY 08/14/14   Lelon Perla, MD  omeprazole (PRILOSEC) 20 MG capsule Take 20 mg by mouth daily.    Historical  Provider, MD  Polyethyl Glycol-Propyl Glycol (SYSTANE OP) Place 1 drop into both eyes daily as needed (dry eyes).    Historical Provider, MD  Umeclidinium-Vilanterol (ANORO ELLIPTA) 62.5-25 MCG/INH AEPB Inhale 1 Act into the lungs daily. 06/20/13   Aleksei Plotnikov V, MD  vitamin B-12 (CYANOCOBALAMIN) 1000 MCG tablet Take 500 mcg by mouth daily.     Historical Provider, MD   BP 110/89 mmHg  Pulse 59  Temp(Src) 97.4 F (36.3 C) (Oral)  Resp 20  SpO2 97% Physical Exam  Constitutional: She appears well-developed and well-nourished.  HENT:  Right Ear: Tympanic membrane, external ear and ear canal normal. Decreased hearing is noted.  Ears:  Mouth/Throat: Oropharynx is clear and moist.  Nursing note  and vitals reviewed.   ED Course  Procedures (including critical care time) Labs Review Labs Reviewed - No data to display  Imaging Review No results found.   MDM   1. Cerumen impaction, left    Sx relieved and tm nl after irrig.    Billy Fischer, MD 09/16/14 1726

## 2014-09-16 NOTE — ED Notes (Signed)
Pt  Reports l  Ear  Clogged   With  Wax       -    denys  Any  Other   Symptoms            Pt  Ambulated  To  Room   With a  Steady  Fluid  Gait

## 2014-09-30 ENCOUNTER — Other Ambulatory Visit: Payer: Self-pay | Admitting: Internal Medicine

## 2014-10-01 NOTE — Telephone Encounter (Signed)
Called in ativan to [facility] per dr plotnikovs note

## 2014-10-09 ENCOUNTER — Ambulatory Visit: Payer: Self-pay | Admitting: Cardiology

## 2014-10-27 NOTE — Progress Notes (Signed)
HPI: FU paroxysmal atrial fibrillation. Myoview on 01/21/08 showed normal perfusion and an ejection fraction of 73%. Last echocardiogram in Feb 2012 showed normal LV function, mild biatrial enlargement, mild MR and trace AI. A previous monitor revealed paroxysmal atrial fibrillation/flutter with a rapid ventricular response. Note she refuses Coumadin. A CardioNet was performed in Feb 2012 secondary to palpitations and revealed sinus rhythm with PACs and rare PVCs. Since I last saw her,   Current Outpatient Prescriptions  Medication Sig Dispense Refill  . aspirin 81 MG chewable tablet Chew 81 mg by mouth 2 (two) times daily.     . Aspirin-Salicylamide-Caffeine (BC HEADACHE PO) Take 1 packet by mouth as needed (pain).    . Cholecalciferol ([employer]S VITAMIN D3) 1000 UNITS capsule Take 1,000 Units by mouth daily.      . clonazePAM (KLONOPIN) 0.5 MG tablet TAKE 1- 2 TABLETS BY MOUTH AT BEDTIME 60 tablet 5  . flecainide (TAMBOCOR) 50 MG tablet Take 1 tablet (50 mg total) by mouth 2 (two) times daily. 60 tablet 3  . fluconazole (DIFLUCAN) 150 MG tablet Take 1 tablet (150 mg total) by mouth once. (Patient not taking: Reported on 08/28/2014) 1 tablet 1  . Hydrocod Polst-Chlorphen Polst 10-8 MG CP12 1 po bid prn cough (Patient not taking: Reported on 08/28/2014) 14 each 0  . lactose free nutrition (BOOST) LIQD Take 237 mLs by mouth daily with breakfast.    . LORazepam (ATIVAN) 1 MG tablet TAKE 1 OR 2 TABLETS BY MOUTH AT BEDTIME AS NEEDED FOR SLEEP OR ANXIETY 60 tablet 5  . metoprolol tartrate (LOPRESSOR) 25 MG tablet TAKE 1/2 TABLET BY MOUTH TWICE DAILY 90 tablet 0  . omeprazole (PRILOSEC) 20 MG capsule Take 20 mg by mouth daily.    Vladimir Faster Glycol-Propyl Glycol (SYSTANE OP) Place 1 drop into both eyes daily as needed (dry eyes).    Marland Kitchen Umeclidinium-Vilanterol (ANORO ELLIPTA) 62.5-25 MCG/INH AEPB Inhale 1 Act into the lungs daily. 1 each 11  . vitamin B-12 (CYANOCOBALAMIN) 1000 MCG tablet Take 500 mcg by  mouth daily.      No current facility-administered medications for this visit.     Past Medical History  Diagnosis Date  . Atrial fibrillation   . Arthritis   . Hypertension   . B12 deficiency   . Depression   . Anxiety   . GERD (gastroesophageal reflux disease)   . Vitamin D deficiency disease   . COPD (chronic obstructive pulmonary disease)   . Pneumonia   . Peptic ulcer, unspecified site, unspecified as acute or chronic, without mention of hemorrhage, perforation, or obstruction   . Osteoarthrosis, hand     both hands    Past Surgical History  Procedure Laterality Date  . Bladder surgery      Bladder tack and intestines  . Splenectomy    . Cystocele repair      Social History   Social History  . Marital Status: Divorced    Spouse Name: N/A  . Number of Children: N/A  . Years of Education: N/A   Occupational History  . Not on file.   Social History Main Topics  . Smoking status: Former Research scientist (life sciences)  . Smokeless tobacco: Never Used  . Alcohol Use: No  . Drug Use: No  . Sexual Activity: Not Currently   Other Topics Concern  . Not on file   Social History Narrative   Retired, works part time   Divorced   Former Smoker   1-  Son alcoholic abusive   Daughter died in 06/21/2006    ROS: no fevers or chills, productive cough, hemoptysis, dysphasia, odynophagia, melena, hematochezia, dysuria, hematuria, rash, seizure activity, orthopnea, PND, pedal edema, claudication. Remaining systems are negative.  Physical Exam: Well-developed well-nourished in no acute distress.  Skin is warm and dry.  HEENT is normal.  Neck is supple.  Chest is clear to auscultation with normal expansion.  Cardiovascular exam is regular rate and rhythm.  Abdominal exam nontender or distended. No masses palpated. Extremities show no edema. neuro grossly intact  ECG     This encounter was created in error - please disregard.

## 2014-10-30 ENCOUNTER — Encounter: Payer: Self-pay | Admitting: Cardiology

## 2014-11-23 ENCOUNTER — Other Ambulatory Visit: Payer: Self-pay | Admitting: Cardiology

## 2014-12-07 NOTE — Progress Notes (Signed)
HPI: FU paroxysmal atrial fibrillation. We did perform a Myoview on 01/21/08 that showed normal perfusion and an ejection fraction of 73%. Her last echocardiogram in Feb 2012 showed normal LV function, mild biatrial enlargement, mild MR and trace AI. A previous monitor revealed paroxysmal atrial fibrillation/flutter with a rapid ventricular response. Note she refuses Coumadin. A CardioNet was performed in Feb 2012 secondary to palpitations and revealed sinus rhythm with PACs and rare PVCs. Since I last saw her, the patient has dyspnea with more extreme activities but not with routine activities. It is relieved with rest. It is not associated with chest pain. There is no orthopnea, PND or pedal edema. There is no syncope or palpitations. There is no exertional chest pain.   Current Outpatient Prescriptions  Medication Sig Dispense Refill  . aspirin 81 MG chewable tablet Chew 81 mg by mouth 2 (two) times daily.     . Aspirin-Salicylamide-Caffeine (BC HEADACHE PO) Take 1 packet by mouth as needed (pain).    . Cholecalciferol (CVS VITAMIN D3) 1000 UNITS capsule Take 1,000 Units by mouth daily.      . clonazePAM (KLONOPIN) 0.5 MG tablet TAKE 1- 2 TABLETS BY MOUTH AT BEDTIME 60 tablet 5  . flecainide (TAMBOCOR) 50 MG tablet Take 1 tablet (50 mg total) by mouth 2 (two) times daily. 60 tablet 3  . lactose free nutrition (BOOST) LIQD Take 237 mLs by mouth daily with breakfast.    . LORazepam (ATIVAN) 1 MG tablet TAKE 1 OR 2 TABLETS BY MOUTH AT BEDTIME AS NEEDED FOR SLEEP OR ANXIETY 60 tablet 5  . metoprolol tartrate (LOPRESSOR) 25 MG tablet TAKE 1/2 TABLET BY MOUTH TWICE DAILY 90 tablet 0  . omeprazole (PRILOSEC) 20 MG capsule Take 20 mg by mouth daily.    Marland Kitchen Umeclidinium-Vilanterol (ANORO ELLIPTA) 62.5-25 MCG/INH AEPB Inhale 1 Act into the lungs daily. 1 each 11  . vitamin B-12 (CYANOCOBALAMIN) 1000 MCG tablet Take 500 mcg by mouth daily.      No current facility-administered medications for this  visit.     Past Medical History  Diagnosis Date  . Atrial fibrillation ([place])   . Arthritis   . Hypertension   . B12 deficiency   . Depression   . Anxiety   . GERD (gastroesophageal reflux disease)   . Vitamin D deficiency disease   . COPD (chronic obstructive pulmonary disease) ([place])   . Pneumonia   . Peptic ulcer, unspecified site, unspecified as acute or chronic, without mention of hemorrhage, perforation, or obstruction   . Osteoarthrosis, hand     both hands    Past Surgical History  Procedure Laterality Date  . Bladder surgery      Bladder tack and intestines  . Splenectomy    . Cystocele repair      Social History   Social History  . Marital Status: Divorced    Spouse Name: N/A  . Number of Children: N/A  . Years of Education: N/A   Occupational History  . Not on file.   Social History Main Topics  . Smoking status: Former Research scientist (life sciences)  . Smokeless tobacco: Never Used  . Alcohol Use: No  . Drug Use: No  . Sexual Activity: Not Currently   Other Topics Concern  . Not on file   Social History Narrative   Retired, works part time   Divorced   Former Smoker   1- Son alcoholic abusive   Daughter died in 2006/06/18    ROS: no  fevers or chills, productive cough, hemoptysis, dysphasia, odynophagia, melena, hematochezia, dysuria, hematuria, rash, seizure activity, orthopnea, PND, pedal edema, claudication. Remaining systems are negative.  Physical Exam: Well-developed well-nourished in no acute distress.  Skin is warm and dry.  HEENT is normal.  Neck is supple.  Chest is clear to auscultation with normal expansion.  Cardiovascular exam is regular rate and rhythm.  Abdominal exam nontender or distended. No masses palpated. Extremities show no edema. neuro grossly intact  ECG Sinus rhythm with first-degree AV block and PACs. Cannot rule out prior septal infarct.

## 2014-12-14 ENCOUNTER — Encounter: Payer: Self-pay | Admitting: Cardiology

## 2014-12-14 ENCOUNTER — Ambulatory Visit (INDEPENDENT_AMBULATORY_CARE_PROVIDER_SITE_OTHER): Payer: Medicare Other | Admitting: Cardiology

## 2014-12-14 VITALS — BP 156/70 | HR 64 | Ht 62.0 in | Wt 123.0 lb

## 2014-12-14 DIAGNOSIS — I480 Paroxysmal atrial fibrillation: Secondary | ICD-10-CM

## 2014-12-14 DIAGNOSIS — I10 Essential (primary) hypertension: Secondary | ICD-10-CM | POA: Diagnosis not present

## 2014-12-14 MED ORDER — AMLODIPINE BESYLATE 5 MG PO TABS: 5.0000 mg | ORAL_TABLET | Freq: Every day | ORAL | Status: DC

## 2014-12-14 NOTE — Patient Instructions (Signed)
Medication Instructions:   START AMLODIPINE 5 MG ONCE DAILY  Follow-Up:  Your physician wants you to follow-up in: [place] will receive a reminder letter in the mail two months in advance. If you don't receive a letter, please call our office to schedule the follow-up appointment.

## 2014-12-14 NOTE — Assessment & Plan Note (Signed)
Patient remains in sinus rhythm. Continue Flecainide and metoprolol. Continue aspirin. She declines Coumadin. She understands the risk of CVA is higher with aspirin compared to Coumadin.

## 2014-12-14 NOTE — Assessment & Plan Note (Signed)
Blood pressure elevated. Add Norvasc 5 mg daily and follow.

## 2014-12-20 ENCOUNTER — Other Ambulatory Visit: Payer: Self-pay | Admitting: Cardiology

## 2014-12-28 ENCOUNTER — Ambulatory Visit (INDEPENDENT_AMBULATORY_CARE_PROVIDER_SITE_OTHER): Payer: Medicare Other | Admitting: Internal Medicine

## 2014-12-28 ENCOUNTER — Encounter: Payer: Self-pay | Admitting: Internal Medicine

## 2014-12-28 VITALS — BP 180/80 | HR 63 | Wt 120.0 lb

## 2014-12-28 DIAGNOSIS — J449 Chronic obstructive pulmonary disease, unspecified: Secondary | ICD-10-CM

## 2014-12-28 DIAGNOSIS — L850 Acquired ichthyosis: Secondary | ICD-10-CM

## 2014-12-28 DIAGNOSIS — I10 Essential (primary) hypertension: Secondary | ICD-10-CM | POA: Diagnosis not present

## 2014-12-28 DIAGNOSIS — E538 Deficiency of other specified B group vitamins: Secondary | ICD-10-CM

## 2014-12-28 DIAGNOSIS — Z23 Encounter for immunization: Secondary | ICD-10-CM | POA: Diagnosis not present

## 2014-12-28 DIAGNOSIS — R55 Syncope and collapse: Secondary | ICD-10-CM

## 2014-12-28 DIAGNOSIS — I480 Paroxysmal atrial fibrillation: Secondary | ICD-10-CM | POA: Diagnosis not present

## 2014-12-28 DIAGNOSIS — L853 Xerosis cutis: Secondary | ICD-10-CM

## 2014-12-28 MED ORDER — CLONAZEPAM 0.5 MG PO TABS: ORAL_TABLET | ORAL | Status: DC

## 2014-12-28 MED ORDER — LORAZEPAM 1 MG PO TABS: ORAL_TABLET | ORAL | Status: DC

## 2014-12-28 NOTE — Assessment & Plan Note (Signed)
Remote No relapse 

## 2014-12-28 NOTE — Assessment & Plan Note (Signed)
Doing ok w/o inhalers

## 2014-12-28 NOTE — Assessment & Plan Note (Signed)
Triamc in Eucerin lotion 1:10

## 2014-12-28 NOTE — Assessment & Plan Note (Signed)
Ok to take 1/2 tab bid Start Norvasc 1/2 tab

## 2014-12-28 NOTE — Assessment & Plan Note (Signed)
Chronic. Rate is controlled on Lopressor. On ASA

## 2014-12-28 NOTE — Assessment & Plan Note (Signed)
On B12 

## 2014-12-28 NOTE — Progress Notes (Signed)
Subjective:  Patient ID: [name], female    DOB: 12-Oct-1935  Age: 79 y.o. MRN: [id number]  CC: No chief complaint on file.   HPI [name] presents for elevated BP, COPD, anxiety f/u. Pt has not started Amlodipine yet...she is afraid...  Outpatient Prescriptions Prior to Visit  Medication Sig Dispense Refill  . aspirin 81 MG chewable tablet Chew 81 mg by mouth 2 (two) times daily.     . Aspirin-Salicylamide-Caffeine (BC HEADACHE PO) Take 1 packet by mouth as needed (pain).    . Cholecalciferol (CVS VITAMIN D3) 1000 UNITS capsule Take 1,000 Units by mouth daily.      . clonazePAM (KLONOPIN) 0.5 MG tablet TAKE 1- 2 TABLETS BY MOUTH AT BEDTIME 60 tablet 5  . flecainide (TAMBOCOR) 50 MG tablet TAKE 1 TABLET BY MOUTH TWICE DAILY 60 tablet 11  . lactose free nutrition (BOOST) LIQD Take 237 mLs by mouth daily with breakfast.    . LORazepam (ATIVAN) 1 MG tablet TAKE 1 OR 2 TABLETS BY MOUTH AT BEDTIME AS NEEDED FOR SLEEP OR ANXIETY 60 tablet 5  . metoprolol tartrate (LOPRESSOR) 25 MG tablet TAKE 1/2 TABLET BY MOUTH TWICE DAILY 90 tablet 0  . omeprazole (PRILOSEC) 20 MG capsule Take 20 mg by mouth daily.    Marland Kitchen Umeclidinium-Vilanterol (ANORO ELLIPTA) 62.5-25 MCG/INH AEPB Inhale 1 Act into the lungs daily. 1 each 11  . vitamin B-12 (CYANOCOBALAMIN) 1000 MCG tablet Take 500 mcg by mouth daily.     Marland Kitchen amLODipine (NORVASC) 5 MG tablet Take 1 tablet (5 mg total) by mouth daily. (Patient not taking: Reported on 12/28/2014) 90 tablet 3   No facility-administered medications prior to visit.    ROS Review of Systems  Constitutional: Negative for chills, activity change, appetite change, fatigue and unexpected weight change.  HENT: Negative for congestion, mouth sores and sinus pressure.   Eyes: Negative for visual disturbance.  Respiratory: Negative for cough and chest tightness.   Gastrointestinal: Negative for nausea and abdominal pain.  Genitourinary: Negative for frequency, difficulty  urinating and vaginal pain.  Musculoskeletal: Negative for back pain and gait problem.  Skin: Negative for pallor and rash.  Neurological: Negative for dizziness, tremors, weakness, numbness and headaches.  Psychiatric/Behavioral: Positive for sleep disturbance. Negative for suicidal ideas and confusion. The patient is nervous/anxious.     Objective:  BP 180/80 mmHg  Pulse 63  Wt 120 lb (54.432 kg)  SpO2 95%  BP Readings from Last 3 Encounters:  12/28/14 180/80  12/14/14 156/70  09/16/14 110/89    Wt Readings from Last 3 Encounters:  12/28/14 120 lb (54.432 kg)  12/14/14 123 lb (55.792 kg)  08/28/14 127 lb (57.607 kg)    Physical Exam  Constitutional: She appears well-developed. No distress.  HENT:  Head: Normocephalic.  Right Ear: External ear normal.  Left Ear: External ear normal.  Nose: Nose normal.  Mouth/Throat: Oropharynx is clear and moist.  Eyes: Conjunctivae are normal. Pupils are equal, round, and reactive to light. Right eye exhibits no discharge. Left eye exhibits no discharge.  Neck: Normal range of motion. Neck supple. No JVD present. No tracheal deviation present. No thyromegaly present.  Cardiovascular: Normal rate, regular rhythm and normal heart sounds.   Pulmonary/Chest: No stridor. No respiratory distress. She has no wheezes.  Abdominal: Soft. Bowel sounds are normal. She exhibits no distension and no mass. There is no tenderness. There is no rebound and no guarding.  Musculoskeletal: She exhibits no edema or tenderness.  Lymphadenopathy:    She has no cervical adenopathy.  Neurological: She displays normal reflexes. No cranial nerve deficit. She exhibits normal muscle tone. Coordination normal.  Skin: No rash noted. No erythema.  Psychiatric: She has a normal mood and affect. Her behavior is normal. Judgment and thought content normal.    Lab Results  Component Value Date   WBC 12.4* 03/20/2014   HGB 11.5* 03/20/2014   HCT 36.9 03/20/2014   PLT  485.0 Repeated and verified X2.* 03/20/2014   GLUCOSE 70 03/20/2014   CHOL 199 03/21/2013   TRIG 107.0 03/21/2013   HDL 57.50 03/21/2013   LDLDIRECT 144.3 01/31/2011   LDLCALC 120* 03/21/2013   ALT 9 03/20/2014   AST 20 03/20/2014   NA 142 03/20/2014   K 4.7 03/20/2014   CL 109 03/20/2014   CREATININE 0.96 03/20/2014   BUN 26* 03/20/2014   CO2 26 03/20/2014   TSH 1.19 03/20/2014   INR 0.9 08/13/2008    No results found.  Assessment & Plan:   There are no diagnoses linked to this encounter. I am having Ms. Jaskot maintain her aspirin, vitamin B-12, Cholecalciferol, omeprazole, lactose free nutrition, Aspirin-Salicylamide-Caffeine (BC HEADACHE PO), Umeclidinium-Vilanterol, clonazePAM, LORazepam, metoprolol tartrate, amLODipine, and flecainide.  No orders of the defined types were placed in this encounter.     Follow-up: No Follow-up on file.  Walker Kehr, MD

## 2014-12-28 NOTE — Progress Notes (Signed)
Pre visit review using our clinic review tool, if applicable. No additional management support is needed unless otherwise documented below in the visit note. 

## 2014-12-29 NOTE — Patient Instructions (Signed)
1 

## 2015-01-08 ENCOUNTER — Telehealth: Payer: Self-pay | Admitting: Internal Medicine

## 2015-01-08 NOTE — Telephone Encounter (Signed)
Can you please call patient. She was in last week and received the flu shot and now she has the flu. I explained it can take a couple weeks to start working.  I offered to schedule another appointment with him but she refused. She asked if you can still call her.

## 2015-01-11 NOTE — Telephone Encounter (Signed)
Left mess for patient to call back.  

## 2015-02-10 DIAGNOSIS — Z961 Presence of intraocular lens: Secondary | ICD-10-CM | POA: Diagnosis not present

## 2015-02-17 ENCOUNTER — Ambulatory Visit: Payer: Self-pay

## 2015-02-17 ENCOUNTER — Other Ambulatory Visit: Payer: Self-pay | Admitting: Occupational Medicine

## 2015-02-17 DIAGNOSIS — R52 Pain, unspecified: Secondary | ICD-10-CM

## 2015-02-22 ENCOUNTER — Other Ambulatory Visit: Payer: Self-pay | Admitting: Cardiology

## 2015-03-30 ENCOUNTER — Encounter: Payer: Self-pay | Admitting: Gastroenterology

## 2015-03-30 ENCOUNTER — Ambulatory Visit (INDEPENDENT_AMBULATORY_CARE_PROVIDER_SITE_OTHER): Payer: Medicare Other | Admitting: Internal Medicine

## 2015-03-30 ENCOUNTER — Encounter: Payer: Self-pay | Admitting: Internal Medicine

## 2015-03-30 VITALS — BP 152/62 | HR 54 | Temp 97.5°F | Resp 14 | Wt 113.0 lb

## 2015-03-30 DIAGNOSIS — K219 Gastro-esophageal reflux disease without esophagitis: Secondary | ICD-10-CM | POA: Diagnosis not present

## 2015-03-30 DIAGNOSIS — J449 Chronic obstructive pulmonary disease, unspecified: Secondary | ICD-10-CM | POA: Diagnosis not present

## 2015-03-30 DIAGNOSIS — R634 Abnormal weight loss: Secondary | ICD-10-CM

## 2015-03-30 NOTE — Assessment & Plan Note (Signed)
1/17 wt loss - new dentures don't fit H/o esoph stricture - Dr Fuller Plan On Omeprazole

## 2015-03-30 NOTE — Assessment & Plan Note (Signed)
1/17 wt loss - new dentures don't fit ?esoph stricture

## 2015-03-30 NOTE — Progress Notes (Signed)
Pre visit review using our clinic review tool, if applicable. No additional management support is needed unless otherwise documented below in the visit note. 

## 2015-03-30 NOTE — Assessment & Plan Note (Addendum)
On HHN Albuterol Try Boost

## 2015-03-30 NOTE — Progress Notes (Signed)
Subjective:  Patient ID: [name], female    DOB: 03-Jul-1935  Age: 80 y.o. MRN: [id number]  CC: No chief complaint on file.   HPI [name] presents for COPD, anxiety, A fib f/u. C/o wt loss - new dentures don't fit  Outpatient Prescriptions Prior to Visit  Medication Sig Dispense Refill  . aspirin 81 MG chewable tablet Chew 81 mg by mouth 2 (two) times daily.     . Aspirin-Salicylamide-Caffeine (BC HEADACHE PO) Take 1 packet by mouth as needed (pain).    . Cholecalciferol (CVS VITAMIN D3) 1000 UNITS capsule Take 1,000 Units by mouth daily.      . clonazePAM (KLONOPIN) 0.5 MG tablet TAKE 1- 2 TABLETS BY MOUTH AT BEDTIME 60 tablet 5  . flecainide (TAMBOCOR) 50 MG tablet TAKE 1 TABLET BY MOUTH TWICE DAILY 60 tablet 11  . lactose free nutrition (BOOST) LIQD Take 237 mLs by mouth daily with breakfast.    . LORazepam (ATIVAN) 1 MG tablet TAKE 1 OR 2 TABLETS BY MOUTH AT BEDTIME AS NEEDED FOR SLEEP OR ANXIETY 60 tablet 5  . metoprolol tartrate (LOPRESSOR) 25 MG tablet TAKE 1/2 TABLET BY MOUTH TWICE DAILY 90 tablet 2  . omeprazole (PRILOSEC) 20 MG capsule Take 20 mg by mouth daily.    Marland Kitchen Umeclidinium-Vilanterol (ANORO ELLIPTA) 62.5-25 MCG/INH AEPB Inhale 1 Act into the lungs daily. 1 each 11  . vitamin B-12 (CYANOCOBALAMIN) 1000 MCG tablet Take 500 mcg by mouth daily.     Marland Kitchen amLODipine (NORVASC) 5 MG tablet Take 1 tablet (5 mg total) by mouth daily. (Patient not taking: Reported on 12/28/2014) 90 tablet 3   No facility-administered medications prior to visit.    ROS Review of Systems  Constitutional: Positive for unexpected weight change. Negative for chills, activity change, appetite change and fatigue.  HENT: Negative for congestion, mouth sores and sinus pressure.   Eyes: Negative for visual disturbance.  Respiratory: Positive for shortness of breath. Negative for cough and chest tightness.   Gastrointestinal: Negative for nausea and abdominal pain.  Genitourinary: Negative for  frequency, difficulty urinating and vaginal pain.  Musculoskeletal: Negative for back pain and gait problem.  Skin: Negative for pallor and rash.  Neurological: Negative for dizziness, tremors, weakness, numbness and headaches.  Psychiatric/Behavioral: Negative for suicidal ideas, confusion and sleep disturbance. The patient is nervous/anxious.     Objective:  BP 152/62 mmHg  Pulse 54  Temp(Src) 97.5 F (36.4 C) (Oral)  Resp 14  Wt 113 lb (51.256 kg)  SpO2 96%  BP Readings from Last 3 Encounters:  03/30/15 152/62  12/28/14 180/80  12/14/14 156/70    Wt Readings from Last 3 Encounters:  03/30/15 113 lb (51.256 kg)  12/28/14 120 lb (54.432 kg)  12/14/14 123 lb (55.792 kg)    Physical Exam  Constitutional: She appears well-developed. No distress.  HENT:  Head: Normocephalic.  Right Ear: External ear normal.  Left Ear: External ear normal.  Nose: Nose normal.  Mouth/Throat: Oropharynx is clear and moist.  Eyes: Conjunctivae are normal. Pupils are equal, round, and reactive to light. Right eye exhibits no discharge. Left eye exhibits no discharge.  Neck: Normal range of motion. Neck supple. No JVD present. No tracheal deviation present. No thyromegaly present.  Cardiovascular: Normal rate, regular rhythm and normal heart sounds.   Pulmonary/Chest: No stridor. No respiratory distress. She has no wheezes.  Abdominal: Soft. Bowel sounds are normal. She exhibits no distension and no mass. There is no tenderness. There  is no rebound and no guarding.  Musculoskeletal: She exhibits no edema or tenderness.  Lymphadenopathy:    She has no cervical adenopathy.  Neurological: She displays normal reflexes. No cranial nerve deficit. She exhibits normal muscle tone. Coordination normal.  Skin: No rash noted. No erythema.  Psychiatric: She has a normal mood and affect. Her behavior is normal. Judgment and thought content normal.    Lab Results  Component Value Date   WBC 12.4*  03/20/2014   HGB 11.5* 03/20/2014   HCT 36.9 03/20/2014   PLT 485.0 Repeated and verified X2.* 03/20/2014   GLUCOSE 70 03/20/2014   CHOL 199 03/21/2013   TRIG 107.0 03/21/2013   HDL 57.50 03/21/2013   LDLDIRECT 144.3 01/31/2011   LDLCALC 120* 03/21/2013   ALT 9 03/20/2014   AST 20 03/20/2014   NA 142 03/20/2014   K 4.7 03/20/2014   CL 109 03/20/2014   CREATININE 0.96 03/20/2014   BUN 26* 03/20/2014   CO2 26 03/20/2014   TSH 1.19 03/20/2014   INR 0.9 08/13/2008    No results found.  Assessment & Plan:   There are no diagnoses linked to this encounter. I have discontinued [name] amLODipine. I am also having her maintain her aspirin, vitamin B-12, Cholecalciferol, omeprazole, lactose free nutrition, Aspirin-Salicylamide-Caffeine (BC HEADACHE PO), Umeclidinium-Vilanterol, flecainide, clonazePAM, LORazepam, and metoprolol tartrate.  No orders of the defined types were placed in this encounter.     Follow-up: No Follow-up on file.  Walker Kehr, MD

## 2015-03-30 NOTE — Patient Instructions (Signed)
Try [place] yogurt

## 2015-03-31 ENCOUNTER — Encounter: Payer: Self-pay | Admitting: Gastroenterology

## 2015-04-16 ENCOUNTER — Ambulatory Visit (INDEPENDENT_AMBULATORY_CARE_PROVIDER_SITE_OTHER): Payer: Medicare Other | Admitting: Gastroenterology

## 2015-04-16 ENCOUNTER — Encounter: Payer: Self-pay | Admitting: Gastroenterology

## 2015-04-16 VITALS — BP 122/64 | HR 76 | Ht 63.78 in | Wt 111.4 lb

## 2015-04-16 DIAGNOSIS — R1310 Dysphagia, unspecified: Secondary | ICD-10-CM | POA: Diagnosis not present

## 2015-04-16 NOTE — Progress Notes (Addendum)
04/16/2015 [name] [id number] January 24, 1936   HISTORY OF PRESENT ILLNESS:  This is a pleasant 80 year old female who is previously known to Dr. Sharlett Iles. She has not been seen here in several years. She has been referred here by her PCP, Dr. Alain Marion, for evaluation of dysphagia. She says that this has been occurring for a long time, several years. She admits to solid food and pills getting stuck on occasion. No problems with swallowing liquids. Had an EGD in 2006 actually by Dr. Fuller Plan for complaints of dysphagia and epigastric pain. She was found to have gastric ulcer and gastritis. There is no esophageal stricture seen, but she was empirically dilated due to complaints of dysphagia. She admits to about a 10 pound weight loss, but she says that this is occurred since she got new dentures that do not fit and she cannot chew food well so has been drinking mostly liquids such as Boost. She is on omeprazole 20 mg daily.  She says that she had a colonoscopy a long time ago, but I do not see those records. She adamantly declines to have this done.  Denies constipation, diarrhea, dark or bloody stool, and abdominal pain.   Past Medical History  Diagnosis Date  . Atrial fibrillation ([place])   . Arthritis   . Hypertension   . B12 deficiency   . Depression   . Anxiety   . GERD (gastroesophageal reflux disease)   . Vitamin D deficiency disease   . COPD (chronic obstructive pulmonary disease) ([place])   . Pneumonia   . Peptic ulcer, unspecified site, unspecified as acute or chronic, without mention of hemorrhage, perforation, or obstruction   . Osteoarthrosis, hand     both hands   Past Surgical History  Procedure Laterality Date  . Bladder surgery      Bladder tack and intestines  . Splenectomy    . Cystocele repair      reports that she has quit smoking. She has never used smokeless tobacco. She reports that she does not drink alcohol or use illicit drugs. family history includes  Diabetes in her daughter; Heart disease in her father. Allergies  Allergen Reactions  . Amoxicillin     "makes me crazy"  . Codeine     REACTION: Nausea  . Other     Pt does not want to take any pain meds      Outpatient Encounter Prescriptions as of 04/16/2015  Medication Sig  . aspirin 81 MG chewable tablet Chew 81 mg by mouth 2 (two) times daily.   . Cholecalciferol ([employer]S VITAMIN D3) 1000 UNITS capsule Take 1,000 Units by mouth daily.    . clonazePAM (KLONOPIN) 0.5 MG tablet TAKE 1- 2 TABLETS BY MOUTH AT BEDTIME  . flecainide (TAMBOCOR) 50 MG tablet TAKE 1 TABLET BY MOUTH TWICE DAILY  . lactose free nutrition (BOOST) LIQD Take 237 mLs by mouth daily with breakfast.  . LORazepam (ATIVAN) 1 MG tablet TAKE 1 OR 2 TABLETS BY MOUTH AT BEDTIME AS NEEDED FOR SLEEP OR ANXIETY  . metoprolol tartrate (LOPRESSOR) 25 MG tablet TAKE 1/2 TABLET BY MOUTH TWICE DAILY  . omeprazole (PRILOSEC) 20 MG capsule Take 20 mg by mouth daily.  Marland Kitchen Umeclidinium-Vilanterol (ANORO ELLIPTA) 62.5-25 MCG/INH AEPB Inhale 1 Act into the lungs daily.  . vitamin B-12 (CYANOCOBALAMIN) 1000 MCG tablet Take 500 mcg by mouth daily.   . [DISCONTINUED] Aspirin-Salicylamide-Caffeine (BC HEADACHE PO) Take 1 packet by mouth as needed (pain).   No facility-administered  encounter medications on file as of 04/16/2015.     REVIEW OF SYSTEMS  : All other systems reviewed and negative except where noted in the History of Present Illness.   PHYSICAL EXAM: BP 122/64 mmHg  Pulse 76  Ht 5' 3.78" (1.62 m)  Wt 111 lb 6 oz (50.519 kg)  BMI 19.25 kg/m2 General: Well developed white female in no acute distress Head: Normocephalic and atraumatic Eyes:  Sclerae anicteric, conjunctiva pink. Ears: Normal auditory acuity Lungs: Clear throughout to auscultation Heart: Regular rate and rhythm Abdomen: Soft, non-distended.  Normal bowel sounds.  Non-tender. Musculoskeletal:  Ulnar deviation/arthritic changes of her fingers noted. Skin: No  lesions on visible extremities Extremities: No edema  Neurological: Alert oriented x 4, grossly non-focal Psychological:  Alert and cooperative. Normal mood and affect  ASSESSMENT AND PLAN: -80 year old female with complaints of dysphagia to solid food and pills intermittently. This has been occurring for quite some time. EGD in 2006 without stricture or narrowing but was empirically dilated for complaints of dysphagia at that time. Patient does not want EGD unless needed. Admits to weight loss, but says that this has occurred since she has had problems with her dentures and cannot chew well.  We will start with barium esophagram with tablet to assess for dysmotility, etc.  **Just of note, we did discuss colonoscopy as well and she adamantly declines to have this done.  CC:  Plotnikov, Evie Lacks, MD  Addendum: Reviewed and agree with initial management. Jerene Bears, MD

## 2015-04-16 NOTE — Patient Instructions (Addendum)
You have been scheduled for a modified barium swallow on 04/23/15 at 1:30 pm. Please arrive 15 minutes prior to your test for registration. You will go to [facility] (1st Floor) for your appointment. Please refrain from eating or drinking anything 3 hours prior to your test. Should you need to cancel or reschedule your appointment, please contact [phone] [facility]) or [phone] [place]). _____________________________________________________________________ A Modified Barium Swallow Study, or MBS, is a special x-ray that is taken to check swallowing skills. It is carried out by a Stage manager and a Psychologist, clinical (SLP). During this test, yourmouth, throat, and esophagus, a muscular tube which connects your mouth to your stomach, is checked. The test will help you, your doctor, and the SLP plan what types of foods and liquids are easier for you to swallow. The SLP will also identify positions and ways to help you swallow more easily and safely. What will happen during an MBS? You will be taken to an x-ray room and seated comfortably. You will be asked to swallow small amounts of food and liquid mixed with barium. Barium is a liquid or paste that allows images of your mouth, throat and esophagus to be seen on x-ray. The x-ray captures moving images of the food you are swallowing as it travels from your mouth through your throat and into your esophagus. This test helps identify whether food or liquid is entering your lungs (aspiration). The test also shows which part of your mouth or throat lacks strength or coordination to move the food or liquid in the right direction. This test typically takes 30 minutes to 1 hour to complete. _______________________________________________________________________

## 2015-04-19 ENCOUNTER — Ambulatory Visit: Payer: Self-pay | Admitting: Gastroenterology

## 2015-04-23 ENCOUNTER — Ambulatory Visit (HOSPITAL_COMMUNITY)
Admission: RE | Admit: 2015-04-23 | Discharge: 2015-04-23 | Disposition: A | Discharge status: Home / Self Care | Payer: Medicare Other | Source: Ambulatory Visit | Attending: Gastroenterology | Admitting: Gastroenterology

## 2015-04-23 DIAGNOSIS — K224 Dyskinesia of esophagus: Secondary | ICD-10-CM | POA: Insufficient documentation

## 2015-04-23 DIAGNOSIS — R1310 Dysphagia, unspecified: Secondary | ICD-10-CM | POA: Diagnosis not present

## 2015-05-28 ENCOUNTER — Ambulatory Visit: Payer: Self-pay | Admitting: Internal Medicine

## 2015-06-11 ENCOUNTER — Ambulatory Visit (INDEPENDENT_AMBULATORY_CARE_PROVIDER_SITE_OTHER): Payer: Medicare Other | Admitting: Internal Medicine

## 2015-06-11 ENCOUNTER — Encounter: Payer: Self-pay | Admitting: Internal Medicine

## 2015-06-11 VITALS — BP 140/70 | HR 62 | Temp 97.9°F | Ht 63.78 in | Wt 112.0 lb

## 2015-06-11 DIAGNOSIS — R634 Abnormal weight loss: Secondary | ICD-10-CM

## 2015-06-11 DIAGNOSIS — I10 Essential (primary) hypertension: Secondary | ICD-10-CM

## 2015-06-11 DIAGNOSIS — E538 Deficiency of other specified B group vitamins: Secondary | ICD-10-CM

## 2015-06-11 DIAGNOSIS — F411 Generalized anxiety disorder: Secondary | ICD-10-CM | POA: Diagnosis not present

## 2015-06-11 DIAGNOSIS — I480 Paroxysmal atrial fibrillation: Secondary | ICD-10-CM

## 2015-06-11 MED ORDER — LORAZEPAM 1 MG PO TABS: ORAL_TABLET | ORAL | Status: DC

## 2015-06-11 MED ORDER — METOPROLOL TARTRATE 25 MG PO TABS: 12.5000 mg | ORAL_TABLET | Freq: Two times a day (BID) | ORAL | Status: DC

## 2015-06-11 MED ORDER — CLONAZEPAM 0.5 MG PO TABS: ORAL_TABLET | ORAL | Status: DC

## 2015-06-11 NOTE — Assessment & Plan Note (Signed)
Lopressor Norvasc 

## 2015-06-11 NOTE — Assessment & Plan Note (Signed)
on Lopressor. On ASA

## 2015-06-11 NOTE — Progress Notes (Signed)
Subjective:  Patient ID: [name], female    DOB: [birth year]  Age: 80 y.o. MRN: [id number]  CC: No chief complaint on file.   HPI  [name] presents for neck pain off and on. C/o dizziness. C/o anxiety. F/u GERD  Outpatient Prescriptions Prior to Visit  Medication Sig Dispense Refill  . aspirin 81 MG chewable tablet Chew 81 mg by mouth 2 (two) times daily.     . Cholecalciferol ([employer]S VITAMIN D3) 1000 UNITS capsule Take 1,000 Units by mouth daily.      . clonazePAM (KLONOPIN) 0.5 MG tablet TAKE 1- 2 TABLETS BY MOUTH AT BEDTIME 60 tablet 5  . flecainide (TAMBOCOR) 50 MG tablet TAKE 1 TABLET BY MOUTH TWICE DAILY 60 tablet 11  . lactose free nutrition (BOOST) LIQD Take 237 mLs by mouth daily with breakfast.    . LORazepam (ATIVAN) 1 MG tablet TAKE 1 OR 2 TABLETS BY MOUTH AT BEDTIME AS NEEDED FOR SLEEP OR ANXIETY 60 tablet 5  . metoprolol tartrate (LOPRESSOR) 25 MG tablet TAKE 1/2 TABLET BY MOUTH TWICE DAILY 90 tablet 2  . omeprazole (PRILOSEC) 20 MG capsule Take 20 mg by mouth daily.    Marland Kitchen Umeclidinium-Vilanterol (ANORO ELLIPTA) 62.5-25 MCG/INH AEPB Inhale 1 Act into the lungs daily. 1 each 11  . vitamin B-12 (CYANOCOBALAMIN) 1000 MCG tablet Take 500 mcg by mouth daily.      No facility-administered medications prior to visit.    ROS Review of Systems  Constitutional: Positive for fatigue. Negative for chills, activity change, appetite change and unexpected weight change.  HENT: Negative for congestion, mouth sores and sinus pressure.   Eyes: Negative for visual disturbance.  Respiratory: Positive for shortness of breath. Negative for cough and chest tightness.   Gastrointestinal: Negative for nausea and abdominal pain.  Genitourinary: Negative for frequency, difficulty urinating and vaginal pain.  Musculoskeletal: Positive for back pain, arthralgias and neck pain. Negative for gait problem.  Skin: Negative for pallor and rash.  Neurological: Positive for dizziness. Negative  for tremors, weakness, numbness and headaches.  Psychiatric/Behavioral: Negative for suicidal ideas, confusion and sleep disturbance. The patient is nervous/anxious.     Objective:  BP 140/70 mmHg  Pulse 62  Temp(Src) 97.9 F (36.6 C) (Oral)  Ht 5' 3.78" (1.62 m)  Wt 112 lb (50.803 kg)  BMI 19.36 kg/m2  SpO2 95%  BP Readings from Last 3 Encounters:  06/11/15 140/70  04/16/15 122/64  03/30/15 152/62    Wt Readings from Last 3 Encounters:  06/11/15 112 lb (50.803 kg)  04/16/15 111 lb 6 oz (50.519 kg)  03/30/15 113 lb (51.256 kg)    Physical Exam  Constitutional: She appears well-developed. No distress.  HENT:  Head: Normocephalic.  Right Ear: External ear normal.  Left Ear: External ear normal.  Nose: Nose normal.  Mouth/Throat: Oropharynx is clear and moist.  Eyes: Conjunctivae are normal. Pupils are equal, round, and reactive to light. Right eye exhibits no discharge. Left eye exhibits no discharge.  Neck: Normal range of motion. Neck supple. No JVD present. No tracheal deviation present. No thyromegaly present.  Cardiovascular: Normal rate, regular rhythm and normal heart sounds.   Pulmonary/Chest: No stridor. No respiratory distress. She has no wheezes.  Abdominal: Soft. Bowel sounds are normal. She exhibits no distension and no mass. There is no tenderness. There is no rebound and no guarding.  Musculoskeletal: She exhibits no edema or tenderness.  Lymphadenopathy:    She has no cervical adenopathy.  Neurological:  She displays normal reflexes. No cranial nerve deficit. She exhibits normal muscle tone. Coordination normal.  Skin: No rash noted. No erythema.  Psychiatric: She has a normal mood and affect. Her behavior is normal. Judgment and thought content normal.  wax in R ear - removed  Lab Results  Component Value Date   WBC 12.4* 03/20/2014   HGB 11.5* 03/20/2014   HCT 36.9 03/20/2014   PLT 485.0 Repeated and verified X2.* 03/20/2014   GLUCOSE 70 03/20/2014     CHOL 199 03/21/2013   TRIG 107.0 03/21/2013   HDL 57.50 03/21/2013   LDLDIRECT 144.3 01/31/2011   LDLCALC 120* 03/21/2013   ALT 9 03/20/2014   AST 20 03/20/2014   NA 142 03/20/2014   K 4.7 03/20/2014   CL 109 03/20/2014   CREATININE 0.96 03/20/2014   BUN 26* 03/20/2014   CO2 26 03/20/2014   TSH 1.19 03/20/2014   INR 0.9 08/13/2008    Dg Esophagus  04/23/2015  CLINICAL DATA:  Difficulty swallowing and fills getting stuck. EXAM: ESOPHOGRAM / BARIUM SWALLOW / BARIUM TABLET STUDY TECHNIQUE: Combined double contrast and single contrast examination performed using effervescent crystals, thick barium liquid, and thin barium liquid. The patient was observed with fluoroscopy swallowing a 13 mm barium sulphate tablet. FLUOROSCOPY TIME:  Radiation Exposure Index (as provided by the fluoroscopic device): If the device does not provide the exposure index: Fluoroscopy Time:  1 minutes and 40 seconds Number of Acquired Images: COMPARISON:  None. FINDINGS: Frontal and lateral views of the hypopharynx while swallowing reveal a prominent cricopharyngeus impression, but are otherwise unremarkable. Double contrast imaging of the esophagus shows no esophageal mass lesion, luminal narrowing, or mucosal ulceration. No evidence for esophageal diverticulum. There is no hiatal hernia. Evaluation of motility reveals absence of preserved primary peristalsis, consistent with nonspecific esophageal motility disorder. No substantial tertiary contractions and no evidence for presbyesophagus. 13 mm barium tablet passes readily into the stomach when taken with water. IMPRESSION: Nonspecific esophageal motility disorder.  Otherwise normal exam. Electronically Signed   By: Misty Stanley M.D.   On: 04/23/2015 15:14    Assessment & Plan:   There are no diagnoses linked to this encounter. I am having Ms. Mckiddy maintain her aspirin, vitamin B-12, Cholecalciferol, omeprazole, lactose free nutrition, umeclidinium-vilanterol,  flecainide, clonazePAM, LORazepam, and metoprolol tartrate.  No orders of the defined types were placed in this encounter.     Follow-up: No Follow-up on file.  Walker Kehr, MD

## 2015-06-11 NOTE — Progress Notes (Signed)
Pre visit review using our clinic review tool, if applicable. No additional management support is needed unless otherwise documented below in the visit note. 

## 2015-06-11 NOTE — Assessment & Plan Note (Signed)
On B12 

## 2015-06-11 NOTE — Assessment & Plan Note (Signed)
Chronic On Clonazepam prn. Not to take w/Lorazepam   Potential benefits of a long term benzodiazepines  use as well as potential risks  and complications were explained to the patient and were aknowledged.

## 2015-06-11 NOTE — Assessment & Plan Note (Signed)
Wt Readings from Last 3 Encounters:  06/11/15 112 lb (50.803 kg)  04/16/15 111 lb 6 oz (50.519 kg)  03/30/15 113 lb (51.256 kg)

## 2015-06-15 ENCOUNTER — Other Ambulatory Visit (INDEPENDENT_AMBULATORY_CARE_PROVIDER_SITE_OTHER): Payer: Medicare Other

## 2015-06-15 DIAGNOSIS — E538 Deficiency of other specified B group vitamins: Secondary | ICD-10-CM

## 2015-06-15 DIAGNOSIS — I10 Essential (primary) hypertension: Secondary | ICD-10-CM

## 2015-06-15 LAB — BASIC METABOLIC PANEL
BUN: 21 mg/dL (ref 6–23)
CO2: 28 mEq/L (ref 19–32)
Calcium: 9.1 mg/dL (ref 8.4–10.5)
Chloride: 110 mEq/L (ref 96–112)
Creatinine, Ser: 0.88 mg/dL (ref 0.40–1.20)
GFR: 66.15 mL/min (ref >60.00)
Glucose, Bld: 69 mg/dL — ABNORMAL LOW (ref 70–99)
Potassium: 4.9 mEq/L (ref 3.5–5.1)
Sodium: 142 mEq/L (ref 135–145)

## 2015-06-15 LAB — VITAMIN B12: Vitamin B-12: 1126 pg/mL — ABNORMAL HIGH (ref 211–911)

## 2015-07-15 ENCOUNTER — Telehealth: Payer: Self-pay | Admitting: Internal Medicine

## 2015-07-15 NOTE — Telephone Encounter (Signed)
Agree w/ED Thx 

## 2015-07-15 NOTE — Telephone Encounter (Signed)
Patient Name: [name] DOB: [birth year] Initial Comment Caller States her sister is having a hard time breathing, blurred vision, was told she needs to go to the ER but does not want to go Nurse Assessment Nurse: Vallery Sa, RN, [place] Date/Time (Eastern Time): 07/15/2015 8:54:50 AM Confirm and document reason for call. If symptomatic, describe symptoms. You must click the next button to save text entered. ---Caller states Erisa developed breathing difficulty three days ago that is worse today. [place] is not present and her sister is unwilling to go to the ER. Tried reinforcing that she go to the ER, but calling states that she won't go to the ER Called the office backline and notified Gabby who will speak with [name] further. Has the patient traveled out of the country within the last 30 days? ---Not Applicable Does the patient have any new or worsening symptoms? ---Yes Will a triage be completed? ---No Select reason for no triage. ---Other Guidelines Guideline Title Affirmed Question Affirmed Notes Final Disposition User Clinical Call [place], RN, [place] Referrals Transferred [name] to [place].

## 2015-07-15 NOTE — Telephone Encounter (Signed)
Spoke to pt sister. Advised to take her to ED. No appt available at [facility] and she needs to be evaluated at ED if she is seen at [facility] is is more than likely going to be triaged to ED bc of blurred vision. Pt sister was hesitant and still seemed to decline explained the importance and risk of not gong. Pt sister is going to drive over to her house and ask what sister wants to do. Insisted on ED again.

## 2015-07-16 NOTE — Telephone Encounter (Signed)
The son called back saying he didn't see this as a ER visit and he was just trying to understand what was going on. After reading him the notes. I advised him that it seem the ER is what was recommend. He said if that was what the doctor wanted her to do he would support that.

## 2015-08-13 DIAGNOSIS — H1045 Other chronic allergic conjunctivitis: Secondary | ICD-10-CM | POA: Diagnosis not present

## 2015-09-10 ENCOUNTER — Ambulatory Visit: Payer: Self-pay | Admitting: Internal Medicine

## 2015-09-17 ENCOUNTER — Encounter: Payer: Self-pay | Admitting: Internal Medicine

## 2015-09-17 ENCOUNTER — Ambulatory Visit (INDEPENDENT_AMBULATORY_CARE_PROVIDER_SITE_OTHER): Payer: Medicare Other | Admitting: Internal Medicine

## 2015-09-17 VITALS — BP 160/80 | HR 53 | Wt 110.0 lb

## 2015-09-17 DIAGNOSIS — I10 Essential (primary) hypertension: Secondary | ICD-10-CM

## 2015-09-17 DIAGNOSIS — R634 Abnormal weight loss: Secondary | ICD-10-CM

## 2015-09-17 DIAGNOSIS — M5440 Lumbago with sciatica, unspecified side: Secondary | ICD-10-CM

## 2015-09-17 DIAGNOSIS — E538 Deficiency of other specified B group vitamins: Secondary | ICD-10-CM

## 2015-09-17 MED ORDER — CYANOCOBALAMIN 500 MCG PO TABS: 500.0000 ug | ORAL_TABLET | Freq: Every day | ORAL | Status: DC

## 2015-09-17 MED ORDER — FLUCONAZOLE 150 MG PO TABS: 150.0000 mg | ORAL_TABLET | Freq: Once | ORAL | Status: DC

## 2015-09-17 MED ORDER — UMECLIDINIUM-VILANTEROL 62.5-25 MCG/INH IN AEPB: 1.0000 | INHALATION_SPRAY | Freq: Every day | RESPIRATORY_TRACT | Status: DC

## 2015-09-17 NOTE — Assessment & Plan Note (Signed)
On B12 

## 2015-09-17 NOTE — Assessment & Plan Note (Signed)
Discussed.

## 2015-09-17 NOTE — Assessment & Plan Note (Signed)
Lopressor Norvasc 

## 2015-09-17 NOTE — Assessment & Plan Note (Signed)
dentures don't fit yet (6 months)

## 2015-09-17 NOTE — Progress Notes (Signed)
Pre visit review using our clinic review tool, if applicable. No additional management support is needed unless otherwise documented below in the visit note. 

## 2015-09-17 NOTE — Progress Notes (Signed)
Subjective:  Patient ID: [name], female    DOB: [birth year]  Age: 80 y.o. MRN: [id number]  CC: No chief complaint on file.   HPI CONSUELO GERALDS presents for anxiety, OA, HTN, COPD f/u F/u wt loss   Outpatient Prescriptions Prior to Visit  Medication Sig Dispense Refill  . aspirin 81 MG chewable tablet Chew 81 mg by mouth 2 (two) times daily.     . Cholecalciferol ([employer]S VITAMIN D3) 1000 UNITS capsule Take 1,000 Units by mouth daily.      . clonazePAM (KLONOPIN) 0.5 MG tablet TAKE 1- 2 TABLETS BY MOUTH AT BEDTIME 60 tablet 5  . flecainide (TAMBOCOR) 50 MG tablet TAKE 1 TABLET BY MOUTH TWICE DAILY 60 tablet 11  . lactose free nutrition (BOOST) LIQD Take 237 mLs by mouth daily with breakfast.    . LORazepam (ATIVAN) 1 MG tablet TAKE 1 OR 2 TABLETS BY MOUTH AT BEDTIME AS NEEDED FOR SLEEP OR ANXIETY 60 tablet 5  . metoprolol tartrate (LOPRESSOR) 25 MG tablet Take 0.5 tablets (12.5 mg total) by mouth 2 (two) times daily. 90 tablet 2  . omeprazole (PRILOSEC) 20 MG capsule Take 20 mg by mouth daily.    . vitamin B-12 (CYANOCOBALAMIN) 1000 MCG tablet Take 500 mcg by mouth daily.     Marland Kitchen Umeclidinium-Vilanterol (ANORO ELLIPTA) 62.5-25 MCG/INH AEPB Inhale 1 Act into the lungs daily. (Patient not taking: Reported on 09/17/2015) 1 each 11   No facility-administered medications prior to visit.    ROS Review of Systems  Constitutional: Positive for fatigue. Negative for chills, activity change, appetite change and unexpected weight change.  HENT: Negative for congestion, mouth sores and sinus pressure.   Eyes: Negative for visual disturbance.  Respiratory: Positive for shortness of breath. Negative for cough and chest tightness.   Gastrointestinal: Negative for nausea and abdominal pain.  Genitourinary: Negative for frequency, difficulty urinating and vaginal pain.  Musculoskeletal: Negative for back pain and gait problem.  Skin: Negative for pallor and rash.  Neurological: Negative for  dizziness, tremors, weakness, numbness and headaches.  Psychiatric/Behavioral: Negative for confusion and sleep disturbance.    Objective:  BP 160/80 mmHg  Pulse 53  Wt 110 lb (49.896 kg)  SpO2 93%  BP Readings from Last 3 Encounters:  09/17/15 160/80  06/11/15 140/70  04/16/15 122/64    Wt Readings from Last 3 Encounters:  09/17/15 110 lb (49.896 kg)  06/11/15 112 lb (50.803 kg)  04/16/15 111 lb 6 oz (50.519 kg)    Physical Exam  Constitutional: She appears well-developed. No distress.  HENT:  Head: Normocephalic.  Right Ear: External ear normal.  Left Ear: External ear normal.  Nose: Nose normal.  Mouth/Throat: Oropharynx is clear and moist.  Eyes: Conjunctivae are normal. Pupils are equal, round, and reactive to light. Right eye exhibits no discharge. Left eye exhibits no discharge.  Neck: Normal range of motion. Neck supple. No JVD present. No tracheal deviation present. No thyromegaly present.  Cardiovascular: Normal rate, regular rhythm and normal heart sounds.   Pulmonary/Chest: No stridor. No respiratory distress. She has no wheezes.  Abdominal: Soft. Bowel sounds are normal. She exhibits no distension and no mass. There is no tenderness. There is no rebound and no guarding.  Musculoskeletal: She exhibits no edema or tenderness.  Lymphadenopathy:    She has no cervical adenopathy.  Neurological: She displays normal reflexes. No cranial nerve deficit. She exhibits normal muscle tone. Coordination normal.  Skin: No rash noted. No erythema.  Psychiatric: She has a normal mood and affect. Her behavior is normal. Judgment and thought content normal.    Lab Results  Component Value Date   WBC 12.4* 03/20/2014   HGB 11.5* 03/20/2014   HCT 36.9 03/20/2014   PLT 485.0 Repeated and verified X2.* 03/20/2014   GLUCOSE 69* 06/15/2015   CHOL 199 03/21/2013   TRIG 107.0 03/21/2013   HDL 57.50 03/21/2013   LDLDIRECT 144.3 01/31/2011   LDLCALC 120* 03/21/2013   ALT 9  03/20/2014   AST 20 03/20/2014   NA 142 06/15/2015   K 4.9 06/15/2015   CL 110 06/15/2015   CREATININE 0.88 06/15/2015   BUN 21 06/15/2015   CO2 28 06/15/2015   TSH 1.19 03/20/2014   INR 0.9 08/13/2008    Dg Esophagus  04/23/2015  CLINICAL DATA:  Difficulty swallowing and fills getting stuck. EXAM: ESOPHOGRAM / BARIUM SWALLOW / BARIUM TABLET STUDY TECHNIQUE: Combined double contrast and single contrast examination performed using effervescent crystals, thick barium liquid, and thin barium liquid. The patient was observed with fluoroscopy swallowing a 13 mm barium sulphate tablet. FLUOROSCOPY TIME:  Radiation Exposure Index (as provided by the fluoroscopic device): If the device does not provide the exposure index: Fluoroscopy Time:  1 minutes and 40 seconds Number of Acquired Images: COMPARISON:  None. FINDINGS: Frontal and lateral views of the hypopharynx while swallowing reveal a prominent cricopharyngeus impression, but are otherwise unremarkable. Double contrast imaging of the esophagus shows no esophageal mass lesion, luminal narrowing, or mucosal ulceration. No evidence for esophageal diverticulum. There is no hiatal hernia. Evaluation of motility reveals absence of preserved primary peristalsis, consistent with nonspecific esophageal motility disorder. No substantial tertiary contractions and no evidence for presbyesophagus. 13 mm barium tablet passes readily into the stomach when taken with water. IMPRESSION: Nonspecific esophageal motility disorder.  Otherwise normal exam. Electronically Signed   By: Misty Stanley M.D.   On: 04/23/2015 15:14    Assessment & Plan:   There are no diagnoses linked to this encounter. I am having Ms. Dains maintain her aspirin, vitamin B-12, Cholecalciferol, omeprazole, lactose free nutrition, umeclidinium-vilanterol, flecainide, LORazepam, clonazePAM, and metoprolol tartrate.  No orders of the defined types were placed in this encounter.     Follow-up: No  Follow-up on file.  Walker Kehr, MD

## 2016-01-04 ENCOUNTER — Other Ambulatory Visit: Payer: Self-pay | Admitting: *Deleted

## 2016-01-04 NOTE — Telephone Encounter (Signed)
Rec'd fax pt requesting refills on her Clonazepam & Lorazepam..../lmb

## 2016-01-04 NOTE — Telephone Encounter (Signed)
Ok both Thx 

## 2016-01-05 MED ORDER — LORAZEPAM 1 MG PO TABS: ORAL_TABLET | ORAL | 5 refills | Status: DC

## 2016-01-05 MED ORDER — CLONAZEPAM 0.5 MG PO TABS: ORAL_TABLET | ORAL | 5 refills | Status: DC

## 2016-01-05 NOTE — Telephone Encounter (Signed)
Faxed script back to [facility].../lmb 

## 2016-01-10 ENCOUNTER — Other Ambulatory Visit: Payer: Self-pay | Admitting: Cardiology

## 2016-01-10 NOTE — Telephone Encounter (Signed)
Rx request sent to pharmacy.  

## 2016-01-14 ENCOUNTER — Ambulatory Visit (INDEPENDENT_AMBULATORY_CARE_PROVIDER_SITE_OTHER): Payer: Medicare Other | Admitting: Internal Medicine

## 2016-01-14 ENCOUNTER — Encounter: Payer: Self-pay | Admitting: Internal Medicine

## 2016-01-14 DIAGNOSIS — M150 Primary generalized (osteo)arthritis: Secondary | ICD-10-CM

## 2016-01-14 DIAGNOSIS — M5440 Lumbago with sciatica, unspecified side: Secondary | ICD-10-CM

## 2016-01-14 DIAGNOSIS — F411 Generalized anxiety disorder: Secondary | ICD-10-CM

## 2016-01-14 DIAGNOSIS — I480 Paroxysmal atrial fibrillation: Secondary | ICD-10-CM

## 2016-01-14 DIAGNOSIS — I10 Essential (primary) hypertension: Secondary | ICD-10-CM

## 2016-01-14 DIAGNOSIS — E538 Deficiency of other specified B group vitamins: Secondary | ICD-10-CM

## 2016-01-14 DIAGNOSIS — J449 Chronic obstructive pulmonary disease, unspecified: Secondary | ICD-10-CM | POA: Diagnosis not present

## 2016-01-14 DIAGNOSIS — R040 Epistaxis: Secondary | ICD-10-CM

## 2016-01-14 DIAGNOSIS — G4700 Insomnia, unspecified: Secondary | ICD-10-CM

## 2016-01-14 DIAGNOSIS — J4489 Other specified chronic obstructive pulmonary disease: Secondary | ICD-10-CM

## 2016-01-14 DIAGNOSIS — G8929 Other chronic pain: Secondary | ICD-10-CM

## 2016-01-14 DIAGNOSIS — M8949 Other hypertrophic osteoarthropathy, multiple sites: Secondary | ICD-10-CM

## 2016-01-14 DIAGNOSIS — M159 Polyosteoarthritis, unspecified: Secondary | ICD-10-CM

## 2016-01-14 NOTE — Assessment & Plan Note (Signed)
Lorazepam prn  Potential benefits of a long term benzodiazepines  use as well as potential risks  and complications were explained to the patient and were aknowledged.  

## 2016-01-14 NOTE — Progress Notes (Signed)
Pre visit review using our clinic review tool, if applicable. No additional management support is needed unless otherwise documented below in the visit note. 

## 2016-01-14 NOTE — Assessment & Plan Note (Signed)
ASA Lopressor

## 2016-01-14 NOTE — Assessment & Plan Note (Signed)
Tylenol prn 

## 2016-01-14 NOTE — Assessment & Plan Note (Signed)
Better  

## 2016-01-14 NOTE — Assessment & Plan Note (Signed)
Clonazepam 

## 2016-01-14 NOTE — Progress Notes (Signed)
Subjective:  Patient ID: [name], female    DOB: 10-21-1935  Age: 80 y.o. MRN: [id number]  CC: No chief complaint on file.   HPI [name] presents for COPD, anxiety, A fib f/u  Outpatient Medications Prior to Visit  Medication Sig Dispense Refill  . aspirin 81 MG chewable tablet Chew 81 mg by mouth 2 (two) times daily.     . Cholecalciferol ([employer]S VITAMIN D3) 1000 UNITS capsule Take 1,000 Units by mouth daily.      . clonazePAM (KLONOPIN) 0.5 MG tablet TAKE 1- 2 TABLETS BY MOUTH AT BEDTIME 60 tablet 5  . cyanocobalamin (V-R VITAMIN B-12) 500 MCG tablet Take 1 tablet (500 mcg total) by mouth daily. 100 tablet 3  . flecainide (TAMBOCOR) 50 MG tablet TAKE 1 TABLET BY MOUTH TWICE DAILY 60 tablet 6  . lactose free nutrition (BOOST) LIQD Take 237 mLs by mouth daily with breakfast.    . LORazepam (ATIVAN) 1 MG tablet TAKE 1 OR 2 TABLETS BY MOUTH AT BEDTIME AS NEEDED FOR SLEEP OR ANXIETY 60 tablet 5  . metoprolol tartrate (LOPRESSOR) 25 MG tablet Take 0.5 tablets (12.5 mg total) by mouth 2 (two) times daily. 90 tablet 2  . omeprazole (PRILOSEC) 20 MG capsule Take 20 mg by mouth daily.    Marland Kitchen umeclidinium-vilanterol (ANORO ELLIPTA) 62.5-25 MCG/INH AEPB Inhale 1 puff into the lungs daily. (Patient not taking: Reported on 01/14/2016) 3 each 3  . fluconazole (DIFLUCAN) 150 MG tablet Take 1 tablet (150 mg total) by mouth once. 1 tablet 1   No facility-administered medications prior to visit.     ROS Review of Systems  Constitutional: Negative for activity change, appetite change, chills, fatigue and unexpected weight change.  HENT: Negative for congestion, mouth sores and sinus pressure.   Eyes: Negative for visual disturbance.  Respiratory: Negative for cough and chest tightness.   Gastrointestinal: Negative for abdominal pain and nausea.  Genitourinary: Negative for difficulty urinating, frequency and vaginal pain.  Musculoskeletal: Negative for back pain and gait problem.  Skin:  Negative for pallor and rash.  Neurological: Negative for dizziness, tremors, weakness, numbness and headaches.  Psychiatric/Behavioral: Negative for confusion and sleep disturbance. The patient is nervous/anxious.     Objective:  BP (!) 180/70 (BP Location: Left Arm, Patient Position: Sitting, Cuff Size: Normal)   Pulse 62   Temp 97.6 F (36.4 C) (Oral)   Ht 5' 3.75" (1.619 m)   Wt 111 lb 4 oz (50.5 kg)   SpO2 96%   BMI 19.25 kg/m   BP Readings from Last 3 Encounters:  01/14/16 (!) 180/70  09/17/15 (!) 160/80  06/11/15 140/70    Wt Readings from Last 3 Encounters:  01/14/16 111 lb 4 oz (50.5 kg)  09/17/15 110 lb (49.9 kg)  06/11/15 112 lb (50.8 kg)    Physical Exam  Constitutional: She appears well-developed. No distress.  HENT:  Head: Normocephalic.  Right Ear: External ear normal.  Left Ear: External ear normal.  Nose: Nose normal.  Mouth/Throat: Oropharynx is clear and moist.  Eyes: Conjunctivae are normal. Pupils are equal, round, and reactive to light. Right eye exhibits no discharge. Left eye exhibits no discharge.  Neck: Normal range of motion. Neck supple. No JVD present. No tracheal deviation present. No thyromegaly present.  Cardiovascular: Normal rate, regular rhythm and normal heart sounds.   Pulmonary/Chest: No stridor. No respiratory distress. She has no wheezes.  Abdominal: Soft. Bowel sounds are normal. She exhibits no distension and  no mass. There is no tenderness. There is no rebound and no guarding.  Musculoskeletal: She exhibits no edema or tenderness.  Lymphadenopathy:    She has no cervical adenopathy.  Neurological: She displays normal reflexes. No cranial nerve deficit. She exhibits normal muscle tone. Coordination normal.  Skin: No rash noted. No erythema.  Psychiatric: She has a normal mood and affect. Her behavior is normal. Judgment and thought content normal.    Lab Results  Component Value Date   WBC 12.4 (H) 03/20/2014   HGB 11.5 (L)  03/20/2014   HCT 36.9 03/20/2014   PLT 485.0 Repeated and verified X2. (H) 03/20/2014   GLUCOSE 69 (L) 06/15/2015   CHOL 199 03/21/2013   TRIG 107.0 03/21/2013   HDL 57.50 03/21/2013   LDLDIRECT 144.3 01/31/2011   LDLCALC 120 (H) 03/21/2013   ALT 9 03/20/2014   AST 20 03/20/2014   NA 142 06/15/2015   K 4.9 06/15/2015   CL 110 06/15/2015   CREATININE 0.88 06/15/2015   BUN 21 06/15/2015   CO2 28 06/15/2015   TSH 1.19 03/20/2014   INR 0.9 08/13/2008    Dg Esophagus  Result Date: 04/23/2015 CLINICAL DATA:  Difficulty swallowing and fills getting stuck. EXAM: ESOPHOGRAM / BARIUM SWALLOW / BARIUM TABLET STUDY TECHNIQUE: Combined double contrast and single contrast examination performed using effervescent crystals, thick barium liquid, and thin barium liquid. The patient was observed with fluoroscopy swallowing a 13 mm barium sulphate tablet. FLUOROSCOPY TIME:  Radiation Exposure Index (as provided by the fluoroscopic device): If the device does not provide the exposure index: Fluoroscopy Time:  1 minutes and 40 seconds Number of Acquired Images: COMPARISON:  None. FINDINGS: Frontal and lateral views of the hypopharynx while swallowing reveal a prominent cricopharyngeus impression, but are otherwise unremarkable. Double contrast imaging of the esophagus shows no esophageal mass lesion, luminal narrowing, or mucosal ulceration. No evidence for esophageal diverticulum. There is no hiatal hernia. Evaluation of motility reveals absence of preserved primary peristalsis, consistent with nonspecific esophageal motility disorder. No substantial tertiary contractions and no evidence for presbyesophagus. 13 mm barium tablet passes readily into the stomach when taken with water. IMPRESSION: Nonspecific esophageal motility disorder.  Otherwise normal exam. Electronically Signed   By: Misty Stanley M.D.   On: 04/23/2015 15:14    Assessment & Plan:   There are no diagnoses linked to this encounter. I have  discontinued Ms. Cutler fluconazole. I am also having her maintain her aspirin, Cholecalciferol, omeprazole, lactose free nutrition, metoprolol tartrate, cyanocobalamin, umeclidinium-vilanterol, LORazepam, clonazePAM, and flecainide.  No orders of the defined types were placed in this encounter.    Follow-up: No Follow-up on file.  Walker Kehr, MD

## 2016-01-14 NOTE — Assessment & Plan Note (Signed)
Lopressor, Norvasc

## 2016-01-14 NOTE — Assessment & Plan Note (Signed)
On B12 

## 2016-01-14 NOTE — Assessment & Plan Note (Signed)
Anoro - not consistently

## 2016-02-15 DIAGNOSIS — Z961 Presence of intraocular lens: Secondary | ICD-10-CM | POA: Diagnosis not present

## 2016-03-01 NOTE — Progress Notes (Deleted)
HPI: FU paroxysmal atrial fibrillation. We did perform a Myoview on 01/21/08 that showed normal perfusion and an ejection fraction of 73%. Her last echocardiogram in Feb 2012 showed normal LV function, mild biatrial enlargement, mild MR and trace AI. A previous monitor revealed paroxysmal atrial fibrillation/flutter with a rapid ventricular response. Note she refuses Coumadin. A CardioNet was performed in Feb 2012 secondary to palpitations and revealed sinus rhythm with PACs and rare PVCs. Since I last saw her,   Current Outpatient Prescriptions  Medication Sig Dispense Refill  . aspirin 81 MG chewable tablet Chew 81 mg by mouth 2 (two) times daily.     . Cholecalciferol ([employer]S VITAMIN D3) 1000 UNITS capsule Take 1,000 Units by mouth daily.      . clonazePAM (KLONOPIN) 0.5 MG tablet TAKE 1- 2 TABLETS BY MOUTH AT BEDTIME 60 tablet 5  . cyanocobalamin (V-R VITAMIN B-12) 500 MCG tablet Take 1 tablet (500 mcg total) by mouth daily. 100 tablet 3  . flecainide (TAMBOCOR) 50 MG tablet TAKE 1 TABLET BY MOUTH TWICE DAILY 60 tablet 6  . lactose free nutrition (BOOST) LIQD Take 237 mLs by mouth daily with breakfast.    . LORazepam (ATIVAN) 1 MG tablet TAKE 1 OR 2 TABLETS BY MOUTH AT BEDTIME AS NEEDED FOR SLEEP OR ANXIETY 60 tablet 5  . metoprolol tartrate (LOPRESSOR) 25 MG tablet Take 0.5 tablets (12.5 mg total) by mouth 2 (two) times daily. 90 tablet 2  . omeprazole (PRILOSEC) 20 MG capsule Take 20 mg by mouth daily.    Marland Kitchen umeclidinium-vilanterol (ANORO ELLIPTA) 62.5-25 MCG/INH AEPB Inhale 1 puff into the lungs daily. (Patient not taking: Reported on 01/14/2016) 3 each 3   No current facility-administered medications for this visit.      Past Medical History:  Diagnosis Date  . Anxiety   . Arthritis   . Atrial fibrillation ([place])   . B12 deficiency   . COPD (chronic obstructive pulmonary disease) ([place])   . Depression   . GERD (gastroesophageal reflux disease)   . Hypertension   .  Osteoarthrosis, hand    both hands  . Peptic ulcer, unspecified site, unspecified as acute or chronic, without mention of hemorrhage, perforation, or obstruction   . Pneumonia   . Vitamin D deficiency disease     Past Surgical History:  Procedure Laterality Date  . BLADDER SURGERY     Bladder tack and intestines  . CYSTOCELE REPAIR    . SPLENECTOMY      Social History   Social History  . Marital status: Divorced    Spouse name: N/A  . Number of children: 2  . 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 Smoking status: Former Research scientist (life sciences)  . Smokeless tobacco: Never Used  . Alcohol use No  . Drug use: No  . Sexual activity: Not Currently   Other Topics Concern  . Not on file   Social History Narrative   Retired, works part time   Divorced   Former Smoker   1- Son alcoholic abusive   Daughter died in 06/23/06    Family History  Problem Relation Age of Onset  . Heart disease Father   . Arthritis      Family history of  . Coronary artery disease      Family history of  . Hypertension      Family history of  . Diabetes Daughter  43 died    ROS: no fevers or chills, productive cough, hemoptysis, dysphasia, odynophagia, melena, hematochezia, dysuria, hematuria, rash, seizure activity, orthopnea, PND, pedal edema, claudication. Remaining systems are negative.  Physical Exam: Well-developed well-nourished in no acute distress.  Skin is warm and dry.  HEENT is normal.  Neck is supple.  Chest is clear to auscultation with normal expansion.  Cardiovascular exam is regular rate and rhythm.  Abdominal exam nontender or distended. No masses palpated. Extremities show no edema. neuro grossly intact  ECG

## 2016-03-08 ENCOUNTER — Other Ambulatory Visit: Payer: Self-pay | Admitting: Geriatric Medicine

## 2016-03-08 MED ORDER — METOPROLOL TARTRATE 25 MG PO TABS: 12.5000 mg | ORAL_TABLET | Freq: Two times a day (BID) | ORAL | 2 refills | Status: DC

## 2016-03-09 ENCOUNTER — Ambulatory Visit: Payer: Self-pay | Admitting: Cardiology

## 2016-03-20 NOTE — Progress Notes (Deleted)
HPI: FU paroxysmal atrial fibrillation. We did perform a Myoview on 01/21/08 that showed normal perfusion and an ejection fraction of 73%. Her last echocardiogram in Feb 2012 showed normal LV function, mild biatrial enlargement, mild MR and trace AI. A previous monitor revealed paroxysmal atrial fibrillation/flutter with a rapid ventricular response. Note she refuses Coumadin. A CardioNet was performed in Feb 2012 secondary to palpitations and revealed sinus rhythm with PACs and rare PVCs. Since I last saw her,   Current Outpatient Prescriptions  Medication Sig Dispense Refill  . aspirin 81 MG chewable tablet Chew 81 mg by mouth 2 (two) times daily.     . Cholecalciferol (CVS VITAMIN D3) 1000 UNITS capsule Take 1,000 Units by mouth daily.      . clonazePAM (KLONOPIN) 0.5 MG tablet TAKE 1- 2 TABLETS BY MOUTH AT BEDTIME 60 tablet 5  . cyanocobalamin (V-R VITAMIN B-12) 500 MCG tablet Take 1 tablet (500 mcg total) by mouth daily. 100 tablet 3  . flecainide (TAMBOCOR) 50 MG tablet TAKE 1 TABLET BY MOUTH TWICE DAILY 60 tablet 6  . lactose free nutrition (BOOST) LIQD Take 237 mLs by mouth daily with breakfast.    . LORazepam (ATIVAN) 1 MG tablet TAKE 1 OR 2 TABLETS BY MOUTH AT BEDTIME AS NEEDED FOR SLEEP OR ANXIETY 60 tablet 5  . metoprolol tartrate (LOPRESSOR) 25 MG tablet Take 0.5 tablets (12.5 mg total) by mouth 2 (two) times daily. 60 tablet 2  . omeprazole (PRILOSEC) 20 MG capsule Take 20 mg by mouth daily.    Marland Kitchen umeclidinium-vilanterol (ANORO ELLIPTA) 62.5-25 MCG/INH AEPB Inhale 1 puff into the lungs daily. (Patient not taking: Reported on 01/14/2016) 3 each 3   No current facility-administered medications for this visit.      Past Medical History:  Diagnosis Date  . Anxiety   . Arthritis   . Atrial fibrillation ([place])   . B12 deficiency   . COPD (chronic obstructive pulmonary disease) ([place])   . Depression   . GERD (gastroesophageal reflux disease)   . Hypertension   .  Osteoarthrosis, hand    both hands  . Peptic ulcer, unspecified site, unspecified as acute or chronic, without mention of hemorrhage, perforation, or obstruction   . Pneumonia   . Vitamin D deficiency disease     Past Surgical History:  Procedure Laterality Date  . BLADDER SURGERY     Bladder tack and intestines  . CYSTOCELE REPAIR    . SPLENECTOMY      Social History   Social History  . Marital status: Divorced    Spouse name: N/A  . Number of children: 2  . 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 Smoking status: Former Research scientist (life sciences)  . Smokeless tobacco: Never Used  . Alcohol use No  . Drug use: No  . Sexual activity: Not Currently   Other Topics Concern  . Not on file   Social History Narrative   Retired, works part time   Divorced   Former Smoker   1- Son alcoholic abusive   Daughter died in 06-16-2006    Family History  Problem Relation Age of Onset  . Heart disease Father   . Arthritis      Family history of  . Coronary artery disease      Family history of  . Hypertension      Family history of  . Diabetes Daughter  43 died    ROS: no fevers or chills, productive cough, hemoptysis, dysphasia, odynophagia, melena, hematochezia, dysuria, hematuria, rash, seizure activity, orthopnea, PND, pedal edema, claudication. Remaining systems are negative.  Physical Exam: Well-developed well-nourished in no acute distress.  Skin is warm and dry.  HEENT is normal.  Neck is supple.  Chest is clear to auscultation with normal expansion.  Cardiovascular exam is regular rate and rhythm.  Abdominal exam nontender or distended. No masses palpated. Extremities show no edema. neuro grossly intact  ECG

## 2016-03-24 ENCOUNTER — Encounter: Payer: Self-pay | Admitting: Sports Medicine

## 2016-03-24 ENCOUNTER — Other Ambulatory Visit (INDEPENDENT_AMBULATORY_CARE_PROVIDER_SITE_OTHER): Payer: Medicare Other

## 2016-03-24 ENCOUNTER — Ambulatory Visit (INDEPENDENT_AMBULATORY_CARE_PROVIDER_SITE_OTHER): Payer: Medicare Other | Admitting: Sports Medicine

## 2016-03-24 VITALS — BP 148/78 | HR 80 | Temp 98.3°F | Ht 63.75 in | Wt 113.0 lb

## 2016-03-24 DIAGNOSIS — Q8901 Asplenia (congenital): Secondary | ICD-10-CM

## 2016-03-24 DIAGNOSIS — R6889 Other general symptoms and signs: Secondary | ICD-10-CM

## 2016-03-24 DIAGNOSIS — R040 Epistaxis: Secondary | ICD-10-CM

## 2016-03-24 LAB — CBC WITH DIFFERENTIAL/PLATELET
Basophils Absolute: 0 10*3/uL (ref 0.0–0.1)
Basophils Relative: 0 % (ref 0.0–3.0)
Eosinophils Absolute: 0.2 10*3/uL (ref 0.0–0.7)
Eosinophils Relative: 2.4 % (ref 0.0–5.0)
HCT: 34.5 % — ABNORMAL LOW (ref 36.0–46.0)
Hemoglobin: 10.8 g/dL — ABNORMAL LOW (ref 12.0–15.0)
Lymphocytes Relative: 15.4 % (ref 12.0–46.0)
Lymphs Abs: 1.5 10*3/uL (ref 0.7–4.0)
MCHC: 31.4 g/dL (ref 30.0–36.0)
MCV: 83.8 fl (ref 78.0–100.0)
Monocytes Absolute: 1 10*3/uL (ref 0.1–1.0)
Monocytes Relative: 10.5 % (ref 3.0–12.0)
Neutro Abs: 7.1 10*3/uL (ref 1.4–7.7)
Neutrophils Relative %: 71.7 % (ref 43.0–77.0)
Platelets: 382 10*3/uL (ref 150.0–400.0)
RBC: 4.12 Mil/uL (ref 3.87–5.11)
RDW: 18.2 % — ABNORMAL HIGH (ref 11.5–15.5)
WBC: 9.8 10*3/uL (ref 4.0–10.5)

## 2016-03-24 LAB — BASIC METABOLIC PANEL
BUN: 16 mg/dL (ref 6–23)
CO2: 27 mEq/L (ref 19–32)
Calcium: 9.3 mg/dL (ref 8.4–10.5)
Chloride: 105 mEq/L (ref 96–112)
Creatinine, Ser: 0.98 mg/dL (ref 0.40–1.20)
GFR: 58.3 mL/min — ABNORMAL LOW (ref >60.00)
Glucose, Bld: 80 mg/dL (ref 70–99)
Potassium: 4.1 mEq/L (ref 3.5–5.1)
Sodium: 141 mEq/L (ref 135–145)

## 2016-03-24 LAB — POCT INFLUENZA A/B
Influenza A, POC: NEGATIVE
Influenza B, POC: NEGATIVE

## 2016-03-24 MED ORDER — LEVOFLOXACIN 500 MG PO TABS: 500.0000 mg | ORAL_TABLET | Freq: Every day | ORAL | 0 refills | Status: DC

## 2016-03-24 MED ORDER — OSELTAMIVIR PHOSPHATE 75 MG PO CAPS: 75.0000 mg | ORAL_CAPSULE | Freq: Two times a day (BID) | ORAL | 0 refills | Status: DC

## 2016-03-24 NOTE — Progress Notes (Signed)
[name] - 81 y.o. female MRN [id number]  Date of birth: 18-Oct-1935  Office Visit Note: Visit Date: 03/24/2016 PCP: Walker Kehr, MD Referred by: Cassandria Anger, MD  Subjective: Chief Complaint  Patient presents with  . Acute Visit    body aches, chills, fatigue, productive cough, post nasal drainage. Sx started today.   . Acute Visit    nose bleed   HPI:  Acute onset of above symptoms today. Otherwise has been feeling well.  She denies any abdominal pain nausea, or vomiting.  No dysuria, urinary frequency, urinary hesitancy or urinary incontinence.  She reports feeling as though her entire body is sore and achy.  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  There have been multiple sick contacts.  She did experience an acute nosebleed today in the office.  She reports this occurs 2-3 times per month and prior ablation has been recommended.  She reports having been coughing slightly over the past several hours and attributes this to that.   ROS: The patient is a splenic but has been otherwise healthy. Otherwise per HPI.   Clinical History: No specialty comments available.   No results for input(s): HGBA1C, LABURIC in the last 8760 hours.  Assessment & Plan: Visit Diagnoses:    ICD-9-CM ICD-10-CM   1. Flu-like symptoms 780.99 R68.89 POCT Influenza A/B     CBC with Differential/Platelet     Basic Metabolic Panel (BMET)     oseltamivir (TAMIFLU) 75 MG capsule  2. Asplenia 759.0 Q89.01 levofloxacin (LEVAQUIN) 500 MG tablet  3. Epistaxis, recurrent 784.7 R04.0     Plan: We will go ahead and and treat her [facility] for influenza in spite of negative test today.  Given the negative test we will also need to treat for potential encapsulated organism infection given asplenia.  She has previously tolerated Levaquin well and prescription provided for this today.  Tylenol, ibuprofen and fluids recommended.  Will check basic labs to ensure renal function  is doing well.    Regarding her epistasis direct pressure subsequently resolved this and she had a small lesion on the anterior aspect of the nose and likely contributed to this.  I do recommend she follow-up for consideration of further intervention and ablation if this continues to be persistently symptomatic.  Follow-up: No Follow-up on file.  Meds:  Meds ordered this encounter  Medications  . levofloxacin (LEVAQUIN) 500 MG tablet    Sig: Take 1 tablet (500 mg total) by mouth daily.    Dispense:  7 tablet    Refill:  0  . oseltamivir (TAMIFLU) 75 MG capsule    Sig: Take 1 capsule (75 mg total) by mouth 2 (two) times daily.    Dispense:  10 capsule    Refill:  0   Procedures: No notes on file   Objective:  VS:  HT:5' 3.75" (161.9 cm)   WT:113 lb (51.3 kg)  BMI:19.6    BP:(!) 148/78  HR:80bpm  TEMP:98.3 F (36.8 C)(Oral)  RESP:95 % Physical Exam:  Adult elderly frail appearing female in no acute respiratory distress.  She is chilled but afebrile.  No rigors.  Lungs are clear to auscultation bilaterally.  She has a regular rate and rhythm today.  Abdomen is soft and nontender.  No suprapubic tenderness.  Nares are patent with only a small amount of residual blood along the anterior lower turbinate.  Imaging: No results found.  Past Medical/Family/Surgical/Social History: Medications &  Allergies reviewed per EMR Patient Active Problem List   Diagnosis Date Noted  . Generalized anxiety disorder 01/14/2016  . Dysphagia 04/16/2015  . Dry skin dermatitis 12/28/2014  . Atrophic vaginitis 05/26/2014  . Varicose veins of both lower extremities 12/12/2013  . Restless leg syndrome 03/17/2013  . Impacted cerumen of left ear 03/17/2013  . Osteoarthritis, hand 09/05/2011  . Epistaxis, recurrent 08/17/2011  . Well adult exam 01/31/2011  . Low back pain 01/31/2011  . Palpitations 05/24/2010  . CERUMEN IMPACTION 02/23/2010  . PHARYNGITIS, ACUTE 02/23/2010  . SINUSITIS, ACUTE  01/07/2010  . Shortness of breath 09/23/2009  . BRONCHITIS, ACUTE 08/03/2009  . Osteoarthritis 07/06/2009  . TOBACCO USE, QUIT 03/08/2009  . ALLERGIC RHINITIS 11/06/2008  . NAUSEA ALONE 08/19/2008  . DIARRHEA 08/19/2008  . Essential hypertension 07/15/2008  . EMPHYSEMA 07/15/2008  . PEPTIC ULCER DISEASE 07/15/2008  . ARTHRITIS 07/15/2008  . HEADACHE 05/25/2008  . SYNCOPE 03/23/2008  . WEIGHT LOSS, ABNORMAL 03/23/2008  . HEARING IMPAIRMENT 12/23/2007  . B12 deficiency 10/14/2007  . Malaise 10/14/2007  . INSOMNIA, PERSISTENT 07/31/2007  . Anxiety state 03/01/2007  . DEPRESSION 03/01/2007  . Atrial fibrillation ([place]) 02/26/2007  . VITAMIN D DEFICIENCY 02/19/2007  . GERD 02/19/2007  . COPD with chronic bronchitis ([place]) 08/07/2006   Past Medical History:  Diagnosis Date  . Anxiety   . Arthritis   . Atrial fibrillation ([place])   . B12 deficiency   . COPD (chronic obstructive pulmonary disease) ([place])   . Depression   . GERD (gastroesophageal reflux disease)   . Hypertension   . Osteoarthrosis, hand    both hands  . Peptic ulcer, unspecified site, unspecified as acute or chronic, without mention of hemorrhage, perforation, or obstruction   . Pneumonia   . Vitamin D deficiency disease    Family History  Problem Relation Age of Onset  . Heart disease Father   . Arthritis      Family history of  . Coronary artery disease      Family history of  . Hypertension      Family history of  . Diabetes Daughter     9 died   Past Surgical History:  Procedure Laterality Date  . BLADDER SURGERY     Bladder tack and intestines  . CYSTOCELE REPAIR    . 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 Smoking status: Former Research scientist (life sciences)  . Smokeless tobacco: Never Used  . Alcohol use No  . Drug use: No  . Sexual activity: Not Currently

## 2016-03-27 ENCOUNTER — Telehealth: Payer: Self-pay | Admitting: Internal Medicine

## 2016-03-27 NOTE — Telephone Encounter (Signed)
Sister called in stating tamaflu was called in for patient over the weekend but states patient cant afford this medication.  Would like to know if something else could be sent in for patient?

## 2016-03-27 NOTE — Telephone Encounter (Signed)
I called pt's pharmacy. Pt has a deductible making the cost of the Tamiflu around $100. Per PCP there are no other alternatives. The pt needs to take Tamiflu and take OTC meds for her fever, body aches, cough/cold or go to UC.   She states the Levaquin caused N&V. She could only take 1 dose. She is requesting a zpak. Please advise.

## 2016-03-31 ENCOUNTER — Ambulatory Visit: Payer: Medicare Other | Admitting: Cardiology

## 2016-04-03 ENCOUNTER — Ambulatory Visit (INDEPENDENT_AMBULATORY_CARE_PROVIDER_SITE_OTHER): Payer: Medicare Other | Admitting: Internal Medicine

## 2016-04-03 ENCOUNTER — Encounter: Payer: Self-pay | Admitting: Internal Medicine

## 2016-04-03 ENCOUNTER — Ambulatory Visit (INDEPENDENT_AMBULATORY_CARE_PROVIDER_SITE_OTHER)
Admission: RE | Admit: 2016-04-03 | Discharge: 2016-04-03 | Disposition: A | Discharge status: Home / Self Care | Payer: Medicare Other | Source: Ambulatory Visit | Attending: Internal Medicine | Admitting: Internal Medicine

## 2016-04-03 ENCOUNTER — Telehealth: Payer: Self-pay | Admitting: Internal Medicine

## 2016-04-03 DIAGNOSIS — J209 Acute bronchitis, unspecified: Secondary | ICD-10-CM | POA: Diagnosis not present

## 2016-04-03 DIAGNOSIS — J449 Chronic obstructive pulmonary disease, unspecified: Secondary | ICD-10-CM

## 2016-04-03 DIAGNOSIS — R05 Cough: Secondary | ICD-10-CM | POA: Diagnosis not present

## 2016-04-03 MED ORDER — METHYLPREDNISOLONE ACETATE 80 MG/ML IJ SUSP
80.0000 mg | Freq: Once | INTRAMUSCULAR | Status: AC
  Administered 2016-04-03: 80 mg via INTRAMUSCULAR

## 2016-04-03 MED ORDER — UMECLIDINIUM-VILANTEROL 62.5-25 MCG/INH IN AEPB: 1.0000 | INHALATION_SPRAY | Freq: Every day | RESPIRATORY_TRACT | 5 refills | Status: DC

## 2016-04-03 MED ORDER — AZITHROMYCIN 250 MG PO TABS: ORAL_TABLET | ORAL | 0 refills | Status: DC

## 2016-04-03 NOTE — Telephone Encounter (Signed)
Routing to dr plotnikov---are you ok with a letter, please advise why and how long patient needs to be out of work and I will generate a letter----thanks

## 2016-04-03 NOTE — Progress Notes (Signed)
Pre visit review using our clinic review tool, if applicable. No additional management support is needed unless otherwise documented below in the visit note. 

## 2016-04-03 NOTE — Assessment & Plan Note (Signed)
Depomedrol 80 mg IM CXR Anoro qd

## 2016-04-03 NOTE — Progress Notes (Signed)
Subjective:  Patient ID: [name], female    DOB: 1935/07/09  Age: 81 y.o. MRN: [id number]  CC: No chief complaint on file.   HPI [name] presents for URI sx's x 1 week - worse. Took Levaquin x 3 d but threw it up x3 She forgot how to use it Not eating  Outpatient Medications Prior to Visit  Medication Sig Dispense Refill  . aspirin 81 MG chewable tablet Chew 81 mg by mouth 2 (two) times daily.     . Cholecalciferol ([employer]S VITAMIN D3) 1000 UNITS capsule Take 1,000 Units by mouth daily.      . clonazePAM (KLONOPIN) 0.5 MG tablet TAKE 1- 2 TABLETS BY MOUTH AT BEDTIME 60 tablet 5  . cyanocobalamin (V-R VITAMIN B-12) 500 MCG tablet Take 1 tablet (500 mcg total) by mouth daily. 100 tablet 3  . flecainide (TAMBOCOR) 50 MG tablet TAKE 1 TABLET BY MOUTH TWICE DAILY 60 tablet 6  . lactose free nutrition (BOOST) LIQD Take 237 mLs by mouth daily with breakfast.    . LORazepam (ATIVAN) 1 MG tablet TAKE 1 OR 2 TABLETS BY MOUTH AT BEDTIME AS NEEDED FOR SLEEP OR ANXIETY 60 tablet 5  . metoprolol tartrate (LOPRESSOR) 25 MG tablet Take 0.5 tablets (12.5 mg total) by mouth 2 (two) times daily. 60 tablet 2  . omeprazole (PRILOSEC) 20 MG capsule Take 20 mg by mouth daily.    Marland Kitchen levofloxacin (LEVAQUIN) 500 MG tablet Take 1 tablet (500 mg total) by mouth daily. (Patient not taking: Reported on 04/03/2016) 7 tablet 0  . oseltamivir (TAMIFLU) 75 MG capsule Take 1 capsule (75 mg total) by mouth 2 (two) times daily. (Patient not taking: Reported on 04/03/2016) 10 capsule 0   No facility-administered medications prior to visit.     ROS Review of Systems  Constitutional: Positive for chills, diaphoresis, fatigue and unexpected weight change. Negative for activity change and appetite change.  HENT: Positive for congestion and sinus pain. Negative for mouth sores and sinus pressure.   Eyes: Negative for visual disturbance.  Respiratory: Positive for cough and shortness of breath. Negative for chest  tightness.   Cardiovascular: Negative for palpitations.  Gastrointestinal: Negative for abdominal pain and nausea.  Genitourinary: Negative for difficulty urinating, frequency and vaginal pain.  Musculoskeletal: Negative for back pain and gait problem.  Skin: Negative for pallor and rash.  Neurological: Positive for dizziness and weakness. Negative for tremors, numbness and headaches.  Psychiatric/Behavioral: Negative for confusion and sleep disturbance.    Objective:  BP 132/72   Pulse 88   Temp 98.1 F (36.7 C) (Oral)   Resp 18   Wt 105 lb 8 oz (47.9 kg)   SpO2 96%   BMI 18.25 kg/m   BP Readings from Last 3 Encounters:  04/03/16 132/72  03/24/16 (!) 148/78  01/14/16 140/85    Wt Readings from Last 3 Encounters:  04/03/16 105 lb 8 oz (47.9 kg)  03/24/16 113 lb (51.3 kg)  01/14/16 111 lb 4 oz (50.5 kg)    Physical Exam  Constitutional: She appears well-developed. No distress.  HENT:  Head: Normocephalic.  Right Ear: External ear normal.  Left Ear: External ear normal.  Nose: Nose normal.  Mouth/Throat: Oropharynx is clear and moist.  Eyes: Conjunctivae are normal. Pupils are equal, round, and reactive to light. Right eye exhibits no discharge. Left eye exhibits no discharge.  Neck: Normal range of motion. Neck supple. No JVD present. No tracheal deviation present. No thyromegaly present.  Cardiovascular: Normal rate, regular rhythm and normal heart sounds.   Pulmonary/Chest: No stridor. No respiratory distress. She has no wheezes. She has rales.  Abdominal: Soft. Bowel sounds are normal. She exhibits no distension and no mass. There is no tenderness. There is no rebound and no guarding.  Musculoskeletal: She exhibits no edema or tenderness.  Lymphadenopathy:    She has no cervical adenopathy.  Neurological: She displays normal reflexes. No cranial nerve deficit. She exhibits normal muscle tone. Coordination normal.  Skin: No rash noted. No erythema.  Psychiatric:  She has a normal mood and affect. Her behavior is normal. Judgment and thought content normal.  B rales and rhonchi  I personally provided [facility] inhaler use teaching. After the teaching patient was able to demonstrate it's use effectively. All questions were answered   Lab Results  Component Value Date   WBC 9.8 03/24/2016   HGB 10.8 (L) 03/24/2016   HCT 34.5 (L) 03/24/2016   PLT 382.0 03/24/2016   GLUCOSE 80 03/24/2016   CHOL 199 03/21/2013   TRIG 107.0 03/21/2013   HDL 57.50 03/21/2013   LDLDIRECT 144.3 01/31/2011   LDLCALC 120 (H) 03/21/2013   ALT 9 03/20/2014   AST 20 03/20/2014   NA 141 03/24/2016   K 4.1 03/24/2016   CL 105 03/24/2016   CREATININE 0.98 03/24/2016   BUN 16 03/24/2016   CO2 27 03/24/2016   TSH 1.19 03/20/2014   INR 0.9 08/13/2008    Dg Esophagus  Result Date: 04/23/2015 CLINICAL DATA:  Difficulty swallowing and fills getting stuck. EXAM: ESOPHOGRAM / BARIUM SWALLOW / BARIUM TABLET STUDY TECHNIQUE: Combined double contrast and single contrast examination performed using effervescent crystals, thick barium liquid, and thin barium liquid. The patient was observed with fluoroscopy swallowing a 13 mm barium sulphate tablet. FLUOROSCOPY TIME:  Radiation Exposure Index (as provided by the fluoroscopic device): If the device does not provide the exposure index: Fluoroscopy Time:  1 minutes and 40 seconds Number of Acquired Images: COMPARISON:  None. FINDINGS: Frontal and lateral views of the hypopharynx while swallowing reveal a prominent cricopharyngeus impression, but are otherwise unremarkable. Double contrast imaging of the esophagus shows no esophageal mass lesion, luminal narrowing, or mucosal ulceration. No evidence for esophageal diverticulum. There is no hiatal hernia. Evaluation of motility reveals absence of preserved primary peristalsis, consistent with nonspecific esophageal motility disorder. No substantial tertiary contractions and no evidence for  presbyesophagus. 13 mm barium tablet passes readily into the stomach when taken with water. IMPRESSION: Nonspecific esophageal motility disorder.  Otherwise normal exam. Electronically Signed   By: Misty Stanley M.D.   On: 04/23/2015 15:14    Assessment & Plan:   There are no diagnoses linked to this encounter. I am having Ms. Sprunger maintain her aspirin, Cholecalciferol, omeprazole, lactose free nutrition, cyanocobalamin, LORazepam, clonazePAM, flecainide, metoprolol tartrate, levofloxacin, and oseltamivir.  No orders of the defined types were placed in this encounter.    Follow-up: No Follow-up on file.  Walker Kehr, MD

## 2016-04-03 NOTE — Assessment & Plan Note (Signed)
Worse Zpac CXR

## 2016-04-03 NOTE — Patient Instructions (Signed)
Use over-the-counter  "cold" medicines  such a " Delsym" or "Robitussin" cough syrup varietis for cough.  You can use plain "Tylenol" or "Advil" for fever, chills and achyness. Use Halls or Ricola cough drops. Please, make an appointment if you are not better or if you're worse.

## 2016-04-03 NOTE — Telephone Encounter (Signed)
Pt sister called in and said that pt needs letter for employer saying why and how long she would need to be out of work    Sister is [phone]

## 2016-04-04 ENCOUNTER — Telehealth: Payer: Self-pay | Admitting: Internal Medicine

## 2016-04-04 NOTE — Telephone Encounter (Signed)
Letter written, patient's sister advised, letter placed up front for patient's sister to pick up

## 2016-04-04 NOTE — Telephone Encounter (Signed)
Pt lost 2 of her pills (zpak that she was prescribed yesterday) and the pharmacist needs a new RX in order to get two more pills, please advise, pt's sister called and says she needs them today, MS

## 2016-04-04 NOTE — Telephone Encounter (Signed)
Ok 2+ weeks Thx

## 2016-04-04 NOTE — Telephone Encounter (Signed)
Sister has called back in regard.

## 2016-04-04 NOTE — Telephone Encounter (Signed)
Sister called and said that she lost 3 pills not 2

## 2016-04-05 NOTE — Telephone Encounter (Signed)
pls advise on msg below...[name]

## 2016-04-05 NOTE — Telephone Encounter (Signed)
Ok to call in a # of lost pills needed Thx

## 2016-04-06 NOTE — Telephone Encounter (Signed)
Called pt/sisiter spoke w/ sister Mrs. Dalton she stated pt found the package in the trash. She miss and threw them in the trash. She does not need any called in...[name]

## 2016-04-07 NOTE — Progress Notes (Deleted)
HPI: FU paroxysmal atrial fibrillation. We did perform a Myoview on 01/21/08 that showed normal perfusion and an ejection fraction of 73%. Her last echocardiogram in Feb 2012 showed normal LV function, mild biatrial enlargement, mild MR and trace AI. A previous monitor revealed paroxysmal atrial fibrillation/flutter with a rapid ventricular response. Note she refuses Coumadin. A CardioNet was performed in Feb 2012 secondary to palpitations and revealed sinus rhythm with PACs and rare PVCs. Since I last saw her,   Current Outpatient Prescriptions  Medication Sig Dispense Refill  . aspirin 81 MG chewable tablet Chew 81 mg by mouth 2 (two) times daily.     Marland Kitchen azithromycin (ZITHROMAX Z-PAK) 250 MG tablet As directed 6 each 0  . Cholecalciferol ([facility] VITAMIN D3) 1000 UNITS capsule Take 1,000 Units by mouth daily.      . clonazePAM (KLONOPIN) 0.5 MG tablet TAKE 1- 2 TABLETS BY MOUTH AT BEDTIME 60 tablet 5  . cyanocobalamin (V-R VITAMIN B-12) 500 MCG tablet Take 1 tablet (500 mcg total) by mouth daily. 100 tablet 3  . flecainide (TAMBOCOR) 50 MG tablet TAKE 1 TABLET BY MOUTH TWICE DAILY 60 tablet 6  . lactose free nutrition (BOOST) LIQD Take 237 mLs by mouth daily with breakfast.    . LORazepam (ATIVAN) 1 MG tablet TAKE 1 OR 2 TABLETS BY MOUTH AT BEDTIME AS NEEDED FOR SLEEP OR ANXIETY 60 tablet 5  . metoprolol tartrate (LOPRESSOR) 25 MG tablet Take 0.5 tablets (12.5 mg total) by mouth 2 (two) times daily. 60 tablet 2  . omeprazole (PRILOSEC) 20 MG capsule Take 20 mg by mouth daily.    Marland Kitchen umeclidinium-vilanterol (ANORO ELLIPTA) 62.5-25 MCG/INH AEPB Inhale 1 puff into the lungs daily. 1 each 5   No current facility-administered medications for this visit.      Past Medical History:  Diagnosis Date  . Anxiety   . Arthritis   . Atrial fibrillation ([place])   . B12 deficiency   . COPD (chronic obstructive pulmonary disease) ([place])   . Depression   . GERD (gastroesophageal reflux disease)   .  Hypertension   . Osteoarthrosis, hand    both hands  . Peptic ulcer, unspecified site, unspecified as acute or chronic, without mention of hemorrhage, perforation, or obstruction   . Pneumonia   . Vitamin D deficiency disease     Past Surgical History:  Procedure Laterality Date  . BLADDER SURGERY     Bladder tack and intestines  . CYSTOCELE REPAIR    . SPLENECTOMY      Social History   Social History  . Marital status: Divorced    Spouse name: N/A  . Number of children: 2  . 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 Smoking status: Former Research scientist (life sciences)  . Smokeless tobacco: Never Used  . Alcohol use No  . Drug use: No  . Sexual activity: Not Currently   Other Topics Concern  . Not on file   Social History Narrative   Retired, works part time   Divorced   Former Smoker   1- Son alcoholic abusive   Daughter died in 23-Jun-2006    Family History  Problem Relation Age of Onset  . Heart disease Father   . Arthritis      Family history of  . Coronary artery disease      Family history of  . Hypertension      Family history  of  . Diabetes Daughter     43 died    ROS: no fevers or chills, productive cough, hemoptysis, dysphasia, odynophagia, melena, hematochezia, dysuria, hematuria, rash, seizure activity, orthopnea, PND, pedal edema, claudication. Remaining systems are negative.  Physical Exam: Well-developed well-nourished in no acute distress.  Skin is warm and dry.  HEENT is normal.  Neck is supple.  Chest is clear to auscultation with normal expansion.  Cardiovascular exam is regular rate and rhythm.  Abdominal exam nontender or distended. No masses palpated. Extremities show no edema. neuro grossly intact  ECG

## 2016-04-14 ENCOUNTER — Ambulatory Visit: Payer: Medicare Other | Admitting: Cardiology

## 2016-04-17 NOTE — Progress Notes (Signed)
HPI: FU paroxysmal atrial fibrillation. We did perform a Myoview on 01/21/08 that showed normal perfusion and an ejection fraction of 73%. Her last echocardiogram in Feb 2012 showed normal LV function, mild biatrial enlargement, mild MR and trace AI. A previous monitor revealed paroxysmal atrial fibrillation/flutter with a rapid ventricular response. Note she refuses Coumadin. A CardioNet was performed in Feb 2012 secondary to palpitations and revealed sinus rhythm with PACs and rare PVCs. Since I last saw her, there is no dyspnea, chest pain, palpitations or syncope.  Current Outpatient Prescriptions  Medication Sig Dispense Refill  . aspirin 81 MG chewable tablet Chew 81 mg by mouth 2 (two) times daily.     . Cholecalciferol ([facility] VITAMIN D3) 1000 UNITS capsule Take 1,000 Units by mouth daily.      . clonazePAM (KLONOPIN) 0.5 MG tablet TAKE 1- 2 TABLETS BY MOUTH AT BEDTIME 60 tablet 5  . cyanocobalamin (V-R VITAMIN B-12) 500 MCG tablet Take 1 tablet (500 mcg total) by mouth daily. 100 tablet 3  . flecainide (TAMBOCOR) 50 MG tablet TAKE 1 TABLET BY MOUTH TWICE DAILY 60 tablet 6  . lactose free nutrition (BOOST) LIQD Take 237 mLs by mouth daily with breakfast.    . LORazepam (ATIVAN) 1 MG tablet TAKE 1 OR 2 TABLETS BY MOUTH AT BEDTIME AS NEEDED FOR SLEEP OR ANXIETY 60 tablet 5  . metoprolol tartrate (LOPRESSOR) 25 MG tablet Take 0.5 tablets (12.5 mg total) by mouth 2 (two) times daily. 60 tablet 2  . omeprazole (PRILOSEC) 20 MG capsule Take 20 mg by mouth daily.    Marland Kitchen umeclidinium-vilanterol (ANORO ELLIPTA) 62.5-25 MCG/INH AEPB Inhale 1 puff into the lungs daily. 1 each 5   No current facility-administered medications for this visit.      Past Medical History:  Diagnosis Date  . Anxiety   . Arthritis   . Atrial fibrillation ([place])   . B12 deficiency   . COPD (chronic obstructive pulmonary disease) ([place])   . Depression   . GERD (gastroesophageal reflux disease)   . Hypertension   .  Osteoarthrosis, hand    both hands  . Peptic ulcer, unspecified site, unspecified as acute or chronic, without mention of hemorrhage, perforation, or obstruction   . Pneumonia   . Vitamin D deficiency disease     Past Surgical History:  Procedure Laterality Date  . BLADDER SURGERY     Bladder tack and intestines  . CYSTOCELE REPAIR    . SPLENECTOMY      Social History   Social History  . Marital status: Divorced    Spouse name: N/A  . Number of children: 2  . 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 Smoking status: Former Research scientist (life sciences)  . Smokeless tobacco: Never Used  . Alcohol use No  . Drug use: No  . Sexual activity: Not Currently   Other Topics Concern  . Not on file   Social History Narrative   Retired, works part time   Divorced   Former Smoker   1- Son alcoholic abusive   Daughter died in 2006-06-05    Family History  Problem Relation Age of Onset  . Heart disease Father   . Arthritis      Family history of  . Coronary artery disease      Family history of  . Hypertension      Family history of  . Diabetes Daughter  26 died    ROS: Recent upper respiratory infection/bronchitis but hemoptysis, dysphasia, odynophagia, melena, hematochezia, dysuria, hematuria, rash, seizure activity, orthopnea, PND, pedal edema, claudication. Remaining systems are negative.  Physical Exam: Well-developed thin in no acute distress.  Skin is warm and dry.  HEENT is normal.  Neck is supple.  Chest is clear to auscultation with normal expansion.  Cardiovascular exam is regular rate and rhythm.  Abdominal exam nontender or distended. No masses palpated. Extremities show no edema. neuro grossly intact  ECG-Sinus rhythm, first-degree AV block, nonspecific ST changes.  A/P  1 paroxysmal atrial fibrillation-patient remains in sinus rhythm. Continue flecainide and metoprolol. Continue aspirin. She declines  anticoagulation and understands the high risk of CVA.  2 hypertension-blood pressure controlled. Continue present medications.  Kirk Ruths, MD

## 2016-04-18 ENCOUNTER — Encounter: Payer: Self-pay | Admitting: Internal Medicine

## 2016-04-18 ENCOUNTER — Ambulatory Visit (INDEPENDENT_AMBULATORY_CARE_PROVIDER_SITE_OTHER): Payer: Medicare Other | Admitting: Internal Medicine

## 2016-04-18 DIAGNOSIS — I480 Paroxysmal atrial fibrillation: Secondary | ICD-10-CM

## 2016-04-18 DIAGNOSIS — J449 Chronic obstructive pulmonary disease, unspecified: Secondary | ICD-10-CM

## 2016-04-18 DIAGNOSIS — I10 Essential (primary) hypertension: Secondary | ICD-10-CM | POA: Diagnosis not present

## 2016-04-18 DIAGNOSIS — J209 Acute bronchitis, unspecified: Secondary | ICD-10-CM | POA: Diagnosis not present

## 2016-04-18 DIAGNOSIS — F411 Generalized anxiety disorder: Secondary | ICD-10-CM

## 2016-04-18 NOTE — Progress Notes (Signed)
Subjective:  Patient ID: [name], female    DOB: 1935/06/26  Age: 81 y.o. MRN: [id number]  CC: Follow-up (cough, stable) and Dizziness   HPI [name] presents for URI/COPD f/u - feeling better F/u wt loss - gained 2 lbs F/u anxiety  Outpatient Medications Prior to Visit  Medication Sig Dispense Refill  . aspirin 81 MG chewable tablet Chew 81 mg by mouth 2 (two) times daily.     . Cholecalciferol ([facility] VITAMIN D3) 1000 UNITS capsule Take 1,000 Units by mouth daily.      . clonazePAM (KLONOPIN) 0.5 MG tablet TAKE 1- 2 TABLETS BY MOUTH AT BEDTIME 60 tablet 5  . cyanocobalamin (V-R VITAMIN B-12) 500 MCG tablet Take 1 tablet (500 mcg total) by mouth daily. 100 tablet 3  . flecainide (TAMBOCOR) 50 MG tablet TAKE 1 TABLET BY MOUTH TWICE DAILY 60 tablet 6  . lactose free nutrition (BOOST) LIQD Take 237 mLs by mouth daily with breakfast.    . LORazepam (ATIVAN) 1 MG tablet TAKE 1 OR 2 TABLETS BY MOUTH AT BEDTIME AS NEEDED FOR SLEEP OR ANXIETY 60 tablet 5  . metoprolol tartrate (LOPRESSOR) 25 MG tablet Take 0.5 tablets (12.5 mg total) by mouth 2 (two) times daily. 60 tablet 2  . omeprazole (PRILOSEC) 20 MG capsule Take 20 mg by mouth daily.    Marland Kitchen umeclidinium-vilanterol (ANORO ELLIPTA) 62.5-25 MCG/INH AEPB Inhale 1 puff into the lungs daily. 1 each 5  . azithromycin (ZITHROMAX Z-PAK) 250 MG tablet As directed 6 each 0   No facility-administered medications prior to visit.     ROS Review of Systems  Constitutional: Negative for activity change, appetite change, chills, fatigue and unexpected weight change.  HENT: Negative for congestion, mouth sores and sinus pressure.   Eyes: Negative for visual disturbance.  Respiratory: Positive for shortness of breath. Negative for cough and chest tightness.   Gastrointestinal: Negative for abdominal pain and nausea.  Genitourinary: Negative for difficulty urinating, frequency and vaginal pain.  Musculoskeletal: Negative for back pain and gait  problem.  Skin: Negative for pallor and rash.  Neurological: Negative for dizziness, tremors, weakness, numbness and headaches.  Psychiatric/Behavioral: Negative for confusion and sleep disturbance.    Objective:  BP 132/66   Pulse (!) 56   Temp 97.6 F (36.4 C) (Oral)   Resp 16   Ht 5\' 4"  (1.626 m)   Wt 107 lb (48.5 kg)   SpO2 96%   BMI 18.37 kg/m   BP Readings from Last 3 Encounters:  04/18/16 132/66  04/03/16 132/72  03/24/16 (!) 148/78    Wt Readings from Last 3 Encounters:  04/18/16 107 lb (48.5 kg)  04/03/16 105 lb 8 oz (47.9 kg)  03/24/16 113 lb (51.3 kg)    Physical Exam  Constitutional: She appears well-developed. No distress.  HENT:  Head: Normocephalic.  Right Ear: External ear normal.  Left Ear: External ear normal.  Nose: Nose normal.  Mouth/Throat: Oropharynx is clear and moist.  Eyes: Conjunctivae are normal. Pupils are equal, round, and reactive to light. Right eye exhibits no discharge. Left eye exhibits no discharge.  Neck: Normal range of motion. Neck supple. No JVD present. No tracheal deviation present. No thyromegaly present.  Cardiovascular: Normal rate, regular rhythm and normal heart sounds.   Pulmonary/Chest: No stridor. No respiratory distress. She has no wheezes.  Abdominal: Soft. Bowel sounds are normal. She exhibits no distension and no mass. There is no tenderness. There is no rebound and no  guarding.  Musculoskeletal: She exhibits no edema or tenderness.  Lymphadenopathy:    She has no cervical adenopathy.  Neurological: She displays normal reflexes. No cranial nerve deficit. She exhibits normal muscle tone. Coordination normal.  Skin: No rash noted. No erythema.  Psychiatric: She has a normal mood and affect. Her behavior is normal. Judgment and thought content normal.    Lab Results  Component Value Date   WBC 9.8 03/24/2016   HGB 10.8 (L) 03/24/2016   HCT 34.5 (L) 03/24/2016   PLT 382.0 03/24/2016   GLUCOSE 80 03/24/2016    CHOL 199 03/21/2013   TRIG 107.0 03/21/2013   HDL 57.50 03/21/2013   LDLDIRECT 144.3 01/31/2011   LDLCALC 120 (H) 03/21/2013   ALT 9 03/20/2014   AST 20 03/20/2014   NA 141 03/24/2016   K 4.1 03/24/2016   CL 105 03/24/2016   CREATININE 0.98 03/24/2016   BUN 16 03/24/2016   CO2 27 03/24/2016   TSH 1.19 03/20/2014   INR 0.9 08/13/2008    Dg Chest 2 View  Result Date: 04/03/2016 CLINICAL DATA:  Cough, dyspnea, chills, COPD EXAM: CHEST  2 VIEW COMPARISON:  03/11/2014 FINDINGS: Stable hyperinflation with background parenchymal scarring compatible with COPD/ emphysema. No superimposed focal pneumonia, collapse or consolidation. Negative for edema, effusion or pneumothorax. Trachea is midline. Normal heart size and vascularity. Atherosclerosis of the aorta. Degenerative changes noted of the spine. No acute compression fracture. IMPRESSION: COPD/emphysema with hyperinflation and parenchymal scarring. No superimposed acute process Thoracic aortic atherosclerosis Electronically Signed   By: Jerilynn Mages.  Shick M.D.   On: 04/03/2016 14:50    Assessment & Plan:   There are no diagnoses linked to this encounter. I have discontinued Ms. Olejnik azithromycin. I am also having her maintain her aspirin, Cholecalciferol, omeprazole, lactose free nutrition, cyanocobalamin, LORazepam, clonazePAM, flecainide, metoprolol tartrate, and umeclidinium-vilanterol.  No orders of the defined types were placed in this encounter.    Follow-up: No Follow-up on file.  Walker Kehr, MD

## 2016-04-18 NOTE — Assessment & Plan Note (Signed)
Lopressor ASA

## 2016-04-18 NOTE — Progress Notes (Signed)
Pre-visit discussion using our clinic review tool. No additional management support is needed unless otherwise documented below in the visit note.  

## 2016-04-18 NOTE — Assessment & Plan Note (Signed)
Better after a Zpac

## 2016-04-18 NOTE — Assessment & Plan Note (Signed)
Clonazepam prn 

## 2016-04-18 NOTE — Assessment & Plan Note (Signed)
Better  

## 2016-04-18 NOTE — Assessment & Plan Note (Signed)
BP Readings from Last 3 Encounters:  04/18/16 132/66  04/03/16 132/72  03/24/16 (!) 148/78  Lopressor, Norvasc

## 2016-04-26 ENCOUNTER — Encounter: Payer: Self-pay | Admitting: Cardiology

## 2016-04-26 ENCOUNTER — Encounter (INDEPENDENT_AMBULATORY_CARE_PROVIDER_SITE_OTHER): Payer: Self-pay

## 2016-04-26 ENCOUNTER — Ambulatory Visit (INDEPENDENT_AMBULATORY_CARE_PROVIDER_SITE_OTHER): Payer: Medicare Other | Admitting: Cardiology

## 2016-04-26 VITALS — BP 106/64 | HR 65 | Ht 65.0 in | Wt 108.0 lb

## 2016-04-26 DIAGNOSIS — I480 Paroxysmal atrial fibrillation: Secondary | ICD-10-CM

## 2016-04-26 DIAGNOSIS — I10 Essential (primary) hypertension: Secondary | ICD-10-CM | POA: Diagnosis not present

## 2016-04-26 DIAGNOSIS — I4891 Unspecified atrial fibrillation: Secondary | ICD-10-CM

## 2016-04-26 NOTE — Patient Instructions (Signed)
Your physician wants you to follow-up in: ONE YEAR WITH DR CRENSHAW You will receive a reminder letter in the mail two months in advance. If you don't receive a letter, please call our office to schedule the follow-up appointment.   If you need a refill on your cardiac medications before your next appointment, please call your pharmacy.  

## 2016-05-19 ENCOUNTER — Ambulatory Visit (INDEPENDENT_AMBULATORY_CARE_PROVIDER_SITE_OTHER): Payer: Medicare Other | Admitting: Internal Medicine

## 2016-05-19 ENCOUNTER — Encounter: Payer: Self-pay | Admitting: Internal Medicine

## 2016-05-19 ENCOUNTER — Other Ambulatory Visit (INDEPENDENT_AMBULATORY_CARE_PROVIDER_SITE_OTHER): Payer: Medicare Other

## 2016-05-19 VITALS — BP 124/68 | HR 65 | Temp 97.5°F | Resp 16 | Ht 65.0 in | Wt 110.2 lb

## 2016-05-19 DIAGNOSIS — F411 Generalized anxiety disorder: Secondary | ICD-10-CM | POA: Diagnosis not present

## 2016-05-19 DIAGNOSIS — D509 Iron deficiency anemia, unspecified: Secondary | ICD-10-CM

## 2016-05-19 DIAGNOSIS — J449 Chronic obstructive pulmonary disease, unspecified: Secondary | ICD-10-CM | POA: Diagnosis not present

## 2016-05-19 DIAGNOSIS — I480 Paroxysmal atrial fibrillation: Secondary | ICD-10-CM

## 2016-05-19 DIAGNOSIS — R634 Abnormal weight loss: Secondary | ICD-10-CM | POA: Diagnosis not present

## 2016-05-19 DIAGNOSIS — J4489 Other specified chronic obstructive pulmonary disease: Secondary | ICD-10-CM

## 2016-05-19 DIAGNOSIS — E538 Deficiency of other specified B group vitamins: Secondary | ICD-10-CM | POA: Diagnosis not present

## 2016-05-19 DIAGNOSIS — I10 Essential (primary) hypertension: Secondary | ICD-10-CM | POA: Diagnosis not present

## 2016-05-19 DIAGNOSIS — G4700 Insomnia, unspecified: Secondary | ICD-10-CM

## 2016-05-19 DIAGNOSIS — D649 Anemia, unspecified: Secondary | ICD-10-CM | POA: Insufficient documentation

## 2016-05-19 LAB — BASIC METABOLIC PANEL
BUN: 30 mg/dL — ABNORMAL HIGH (ref 6–23)
CO2: 26 mEq/L (ref 19–32)
Calcium: 9.4 mg/dL (ref 8.4–10.5)
Chloride: 105 mEq/L (ref 96–112)
Creatinine, Ser: 0.96 mg/dL (ref 0.40–1.20)
GFR: 59.69 mL/min — ABNORMAL LOW (ref >60.00)
Glucose, Bld: 92 mg/dL (ref 70–99)
Potassium: 4.3 mEq/L (ref 3.5–5.1)
Sodium: 138 mEq/L (ref 135–145)

## 2016-05-19 LAB — CBC WITH DIFFERENTIAL/PLATELET
Basophils Absolute: 0.1 10*3/uL (ref 0.0–0.1)
Basophils Relative: 1 % (ref 0.0–3.0)
Eosinophils Absolute: 0.2 10*3/uL (ref 0.0–0.7)
Eosinophils Relative: 2.3 % (ref 0.0–5.0)
HCT: 35.9 % — ABNORMAL LOW (ref 36.0–46.0)
Hemoglobin: 11.4 g/dL — ABNORMAL LOW (ref 12.0–15.0)
Lymphocytes Relative: 25.4 % (ref 12.0–46.0)
Lymphs Abs: 2.3 10*3/uL (ref 0.7–4.0)
MCHC: 31.8 g/dL (ref 30.0–36.0)
MCV: 87 fl (ref 78.0–100.0)
Monocytes Absolute: 1 10*3/uL (ref 0.1–1.0)
Monocytes Relative: 10.7 % (ref 3.0–12.0)
Neutro Abs: 5.6 10*3/uL (ref 1.4–7.7)
Neutrophils Relative %: 60.6 % (ref 43.0–77.0)
Platelets: 390 10*3/uL (ref 150.0–400.0)
RBC: 4.12 Mil/uL (ref 3.87–5.11)
RDW: 21.8 % — ABNORMAL HIGH (ref 11.5–15.5)
WBC: 9.2 10*3/uL (ref 4.0–10.5)

## 2016-05-19 LAB — IBC PANEL
Iron: 65 ug/dL (ref 42–145)
Saturation Ratios: 11.1 % — ABNORMAL LOW (ref 20.0–50.0)
Transferrin: 420 mg/dL — ABNORMAL HIGH (ref 212.0–360.0)

## 2016-05-19 NOTE — Assessment & Plan Note (Signed)
Better  

## 2016-05-19 NOTE — Assessment & Plan Note (Signed)
Lopressor Norvasc

## 2016-05-19 NOTE — Assessment & Plan Note (Signed)
Labs

## 2016-05-19 NOTE — Assessment & Plan Note (Signed)
Lorazepam prn  Potential benefits of a long term benzodiazepines  use as well as potential risks  and complications were explained to the patient and were aknowledged.  

## 2016-05-19 NOTE — Progress Notes (Signed)
Pre-visit discussion using our clinic review tool. No additional management support is needed unless otherwise documented below in the visit note.  

## 2016-05-19 NOTE — Assessment & Plan Note (Signed)
On B12 

## 2016-05-19 NOTE — Progress Notes (Signed)
Subjective:  Patient ID: [name], female    DOB: 16-Oct-1935  Age: 81 y.o. MRN: [id number]  CC: Follow-up (HTn, COPD, A fib, Gerd, Syncope)   HPI [name] presents for COPD, anxiety, wt loss f/u. Back to work 20 h/wk, gained wt  Outpatient Medications Prior to Visit  Medication Sig Dispense Refill  . aspirin 81 MG chewable tablet Chew 81 mg by mouth 2 (two) times daily.     . Cholecalciferol ([facility] VITAMIN D3) 1000 UNITS capsule Take 1,000 Units by mouth daily.      . clonazePAM (KLONOPIN) 0.5 MG tablet TAKE 1- 2 TABLETS BY MOUTH AT BEDTIME 60 tablet 5  . cyanocobalamin (V-R VITAMIN B-12) 500 MCG tablet Take 1 tablet (500 mcg total) by mouth daily. 100 tablet 3  . flecainide (TAMBOCOR) 50 MG tablet TAKE 1 TABLET BY MOUTH TWICE DAILY 60 tablet 6  . lactose free nutrition (BOOST) LIQD Take 237 mLs by mouth daily with breakfast.    . LORazepam (ATIVAN) 1 MG tablet TAKE 1 OR 2 TABLETS BY MOUTH AT BEDTIME AS NEEDED FOR SLEEP OR ANXIETY 60 tablet 5  . metoprolol tartrate (LOPRESSOR) 25 MG tablet Take 0.5 tablets (12.5 mg total) by mouth 2 (two) times daily. 60 tablet 2  . omeprazole (PRILOSEC) 20 MG capsule Take 20 mg by mouth daily.    Marland Kitchen umeclidinium-vilanterol (ANORO ELLIPTA) 62.5-25 MCG/INH AEPB Inhale 1 puff into the lungs daily. 1 each 5   No facility-administered medications prior to visit.     ROS Review of Systems  Constitutional: Positive for fatigue. Negative for activity change, appetite change, chills and unexpected weight change.  HENT: Negative for congestion, mouth sores and sinus pressure.   Eyes: Negative for visual disturbance.  Respiratory: Positive for shortness of breath. Negative for cough and chest tightness.   Gastrointestinal: Negative for abdominal pain and nausea.  Genitourinary: Negative for difficulty urinating, frequency and vaginal pain.  Musculoskeletal: Negative for back pain and gait problem.  Skin: Negative for pallor and rash.  Neurological:  Negative for dizziness, tremors, weakness, numbness and headaches.  Psychiatric/Behavioral: Negative for confusion, sleep disturbance and suicidal ideas.    Objective:  BP 124/68   Pulse 65   Temp 97.5 F (36.4 C) (Oral)   Resp 16   Ht 5\' 5"  (1.651 m)   Wt 110 lb 4 oz (50 kg)   SpO2 93%   BMI 18.35 kg/m   BP Readings from Last 3 Encounters:  05/19/16 124/68  04/26/16 106/64  04/18/16 132/66    Wt Readings from Last 3 Encounters:  05/19/16 110 lb 4 oz (50 kg)  04/26/16 108 lb (49 kg)  04/18/16 107 lb (48.5 kg)    Physical Exam  Constitutional: She appears well-developed. No distress.  HENT:  Head: Normocephalic.  Right Ear: External ear normal.  Left Ear: External ear normal.  Nose: Nose normal.  Mouth/Throat: Oropharynx is clear and moist.  Eyes: Conjunctivae are normal. Pupils are equal, round, and reactive to light. Right eye exhibits no discharge. Left eye exhibits no discharge.  Neck: Normal range of motion. Neck supple. No JVD present. No tracheal deviation present. No thyromegaly present.  Cardiovascular: Normal rate, regular rhythm and normal heart sounds.   Pulmonary/Chest: No stridor. No respiratory distress. She has no wheezes.  Abdominal: Soft. Bowel sounds are normal. She exhibits no distension and no mass. There is no tenderness. There is no rebound and no guarding.  Musculoskeletal: She exhibits no edema or tenderness.  Lymphadenopathy:    She has no cervical adenopathy.  Neurological: She displays normal reflexes. No cranial nerve deficit. She exhibits normal muscle tone. Coordination normal.  Skin: No rash noted. No erythema.  Psychiatric: She has a normal mood and affect. Her behavior is normal. Judgment and thought content normal.    Lab Results  Component Value Date   WBC 9.8 03/24/2016   HGB 10.8 (L) 03/24/2016   HCT 34.5 (L) 03/24/2016   PLT 382.0 03/24/2016   GLUCOSE 80 03/24/2016   CHOL 199 03/21/2013   TRIG 107.0 03/21/2013   HDL 57.50  03/21/2013   LDLDIRECT 144.3 01/31/2011   LDLCALC 120 (H) 03/21/2013   ALT 9 03/20/2014   AST 20 03/20/2014   NA 141 03/24/2016   K 4.1 03/24/2016   CL 105 03/24/2016   CREATININE 0.98 03/24/2016   BUN 16 03/24/2016   CO2 27 03/24/2016   TSH 1.19 03/20/2014   INR 0.9 08/13/2008    Dg Chest 2 View  Result Date: 04/03/2016 CLINICAL DATA:  Cough, dyspnea, chills, COPD EXAM: CHEST  2 VIEW COMPARISON:  03/11/2014 FINDINGS: Stable hyperinflation with background parenchymal scarring compatible with COPD/ emphysema. No superimposed focal pneumonia, collapse or consolidation. Negative for edema, effusion or pneumothorax. Trachea is midline. Normal heart size and vascularity. Atherosclerosis of the aorta. Degenerative changes noted of the spine. No acute compression fracture. IMPRESSION: COPD/emphysema with hyperinflation and parenchymal scarring. No superimposed acute process Thoracic aortic atherosclerosis Electronically Signed   By: Jerilynn Mages.  Shick M.D.   On: 04/03/2016 14:50    Assessment & Plan:   There are no diagnoses linked to this encounter. I am having [name] maintain her aspirin, Cholecalciferol, omeprazole, lactose free nutrition, cyanocobalamin, LORazepam, clonazePAM, flecainide, metoprolol tartrate, and umeclidinium-vilanterol.  No orders of the defined types were placed in this encounter.    Follow-up: No Follow-up on file.  Walker Kehr, MD

## 2016-05-19 NOTE — Assessment & Plan Note (Signed)
Better Anoro

## 2016-05-19 NOTE — Assessment & Plan Note (Signed)
Clonazepam prn  Potential benefits of a long term benzodiazepines  use as well as potential risks  and complications were explained to the patient and were aknowledged.  

## 2016-05-19 NOTE — Assessment & Plan Note (Addendum)
Lopressor ASA Flecainide

## 2016-05-23 DIAGNOSIS — H5021 Vertical strabismus, right eye: Secondary | ICD-10-CM | POA: Diagnosis not present

## 2016-05-23 DIAGNOSIS — Z961 Presence of intraocular lens: Secondary | ICD-10-CM | POA: Diagnosis not present

## 2016-05-23 DIAGNOSIS — H532 Diplopia: Secondary | ICD-10-CM | POA: Diagnosis not present

## 2016-05-23 DIAGNOSIS — H353131 Nonexudative age-related macular degeneration, bilateral, early dry stage: Secondary | ICD-10-CM | POA: Diagnosis not present

## 2016-05-23 DIAGNOSIS — H04123 Dry eye syndrome of bilateral lacrimal glands: Secondary | ICD-10-CM | POA: Diagnosis not present

## 2016-06-02 ENCOUNTER — Other Ambulatory Visit: Payer: Self-pay | Admitting: Internal Medicine

## 2016-06-02 DIAGNOSIS — R42 Dizziness and giddiness: Secondary | ICD-10-CM | POA: Diagnosis not present

## 2016-06-02 DIAGNOSIS — H538 Other visual disturbances: Secondary | ICD-10-CM | POA: Diagnosis not present

## 2016-06-02 DIAGNOSIS — Z8673 Personal history of transient ischemic attack (TIA), and cerebral infarction without residual deficits: Secondary | ICD-10-CM | POA: Diagnosis not present

## 2016-06-02 DIAGNOSIS — H532 Diplopia: Secondary | ICD-10-CM | POA: Diagnosis not present

## 2016-06-02 DIAGNOSIS — R51 Headache: Secondary | ICD-10-CM | POA: Diagnosis not present

## 2016-06-07 NOTE — Telephone Encounter (Signed)
Pt last seen 05/2016, last refill 01/05/2016.Marland KitchenMarland Kitchenplease advise

## 2016-06-07 NOTE — Telephone Encounter (Signed)
Patient has called back in regard.  States she has been out of medication for a week.  Please send as soon as possible.

## 2016-06-12 NOTE — Telephone Encounter (Signed)
Please advise about refills. Patient is out of medication

## 2016-06-12 NOTE — Telephone Encounter (Signed)
Patient also said she didn't hear about her lab work yet. I did inform her of the notes. She understood.

## 2016-06-12 NOTE — Telephone Encounter (Signed)
Patient is calling again about these refills.   [facility] [address]. Please let me know once taken care of. Thank you.

## 2016-06-13 NOTE — Telephone Encounter (Signed)
Left message with [facility] to fill

## 2016-06-13 NOTE — Telephone Encounter (Signed)
Patient informed. 

## 2016-07-21 ENCOUNTER — Ambulatory Visit: Payer: Self-pay | Admitting: Internal Medicine

## 2016-08-05 ENCOUNTER — Other Ambulatory Visit: Payer: Self-pay | Admitting: Internal Medicine

## 2016-08-10 ENCOUNTER — Ambulatory Visit (INDEPENDENT_AMBULATORY_CARE_PROVIDER_SITE_OTHER): Payer: Medicare Other

## 2016-08-10 ENCOUNTER — Emergency Department (HOSPITAL_COMMUNITY): Payer: Medicare Other

## 2016-08-10 ENCOUNTER — Encounter (HOSPITAL_COMMUNITY): Payer: Self-pay | Admitting: Emergency Medicine

## 2016-08-10 ENCOUNTER — Encounter: Payer: Self-pay | Admitting: Emergency Medicine

## 2016-08-10 ENCOUNTER — Inpatient Hospital Stay (HOSPITAL_COMMUNITY)
Admission: EM | Admit: 2016-08-10 | Discharge: 2016-08-17 | DRG: 693 | Disposition: A | Discharge status: Home / Self Care | Payer: Medicare Other | Attending: Internal Medicine | Admitting: Internal Medicine

## 2016-08-10 ENCOUNTER — Ambulatory Visit (INDEPENDENT_AMBULATORY_CARE_PROVIDER_SITE_OTHER): Payer: Medicare Other | Admitting: Emergency Medicine

## 2016-08-10 VITALS — BP 180/64 | HR 68 | Temp 98.6°F | Resp 16 | Ht 62.25 in | Wt 113.0 lb

## 2016-08-10 DIAGNOSIS — D72829 Elevated white blood cell count, unspecified: Secondary | ICD-10-CM | POA: Diagnosis not present

## 2016-08-10 DIAGNOSIS — N209 Urinary calculus, unspecified: Secondary | ICD-10-CM | POA: Diagnosis present

## 2016-08-10 DIAGNOSIS — Z466 Encounter for fitting and adjustment of urinary device: Secondary | ICD-10-CM | POA: Diagnosis not present

## 2016-08-10 DIAGNOSIS — Z8249 Family history of ischemic heart disease and other diseases of the circulatory system: Secondary | ICD-10-CM | POA: Diagnosis not present

## 2016-08-10 DIAGNOSIS — I4819 Other persistent atrial fibrillation: Secondary | ICD-10-CM | POA: Diagnosis present

## 2016-08-10 DIAGNOSIS — J438 Other emphysema: Secondary | ICD-10-CM | POA: Diagnosis not present

## 2016-08-10 DIAGNOSIS — N1330 Unspecified hydronephrosis: Secondary | ICD-10-CM | POA: Diagnosis not present

## 2016-08-10 DIAGNOSIS — N135 Crossing vessel and stricture of ureter without hydronephrosis: Secondary | ICD-10-CM | POA: Diagnosis not present

## 2016-08-10 DIAGNOSIS — R443 Hallucinations, unspecified: Secondary | ICD-10-CM | POA: Diagnosis present

## 2016-08-10 DIAGNOSIS — R100 Acute abdomen: Secondary | ICD-10-CM | POA: Diagnosis not present

## 2016-08-10 DIAGNOSIS — Z833 Family history of diabetes mellitus: Secondary | ICD-10-CM

## 2016-08-10 DIAGNOSIS — N201 Calculus of ureter: Secondary | ICD-10-CM | POA: Diagnosis not present

## 2016-08-10 DIAGNOSIS — I272 Pulmonary hypertension, unspecified: Secondary | ICD-10-CM | POA: Diagnosis not present

## 2016-08-10 DIAGNOSIS — I10 Essential (primary) hypertension: Secondary | ICD-10-CM | POA: Diagnosis present

## 2016-08-10 DIAGNOSIS — I4891 Unspecified atrial fibrillation: Secondary | ICD-10-CM | POA: Diagnosis present

## 2016-08-10 DIAGNOSIS — R109 Unspecified abdominal pain: Secondary | ICD-10-CM | POA: Diagnosis not present

## 2016-08-10 DIAGNOSIS — I481 Persistent atrial fibrillation: Secondary | ICD-10-CM | POA: Diagnosis not present

## 2016-08-10 DIAGNOSIS — R10811 Right upper quadrant abdominal tenderness: Secondary | ICD-10-CM

## 2016-08-10 DIAGNOSIS — K219 Gastro-esophageal reflux disease without esophagitis: Secondary | ICD-10-CM | POA: Diagnosis not present

## 2016-08-10 DIAGNOSIS — Z87891 Personal history of nicotine dependence: Secondary | ICD-10-CM

## 2016-08-10 DIAGNOSIS — R1111 Vomiting without nausea: Secondary | ICD-10-CM | POA: Diagnosis not present

## 2016-08-10 DIAGNOSIS — T4145XA Adverse effect of unspecified anesthetic, initial encounter: Secondary | ICD-10-CM | POA: Diagnosis not present

## 2016-08-10 DIAGNOSIS — J189 Pneumonia, unspecified organism: Secondary | ICD-10-CM | POA: Diagnosis not present

## 2016-08-10 DIAGNOSIS — Z419 Encounter for procedure for purposes other than remedying health state, unspecified: Secondary | ICD-10-CM

## 2016-08-10 DIAGNOSIS — Z7982 Long term (current) use of aspirin: Secondary | ICD-10-CM | POA: Diagnosis not present

## 2016-08-10 DIAGNOSIS — N132 Hydronephrosis with renal and ureteral calculous obstruction: Secondary | ICD-10-CM | POA: Diagnosis not present

## 2016-08-10 DIAGNOSIS — G92 Toxic encephalopathy: Secondary | ICD-10-CM | POA: Diagnosis not present

## 2016-08-10 DIAGNOSIS — J929 Pleural plaque without asbestos: Secondary | ICD-10-CM | POA: Diagnosis not present

## 2016-08-10 DIAGNOSIS — N200 Calculus of kidney: Secondary | ICD-10-CM

## 2016-08-10 DIAGNOSIS — Z9081 Acquired absence of spleen: Secondary | ICD-10-CM | POA: Diagnosis not present

## 2016-08-10 DIAGNOSIS — I351 Nonrheumatic aortic (valve) insufficiency: Secondary | ICD-10-CM | POA: Diagnosis not present

## 2016-08-10 DIAGNOSIS — R197 Diarrhea, unspecified: Secondary | ICD-10-CM | POA: Diagnosis not present

## 2016-08-10 DIAGNOSIS — I110 Hypertensive heart disease with heart failure: Secondary | ICD-10-CM | POA: Diagnosis present

## 2016-08-10 DIAGNOSIS — I5033 Acute on chronic diastolic (congestive) heart failure: Secondary | ICD-10-CM | POA: Diagnosis not present

## 2016-08-10 DIAGNOSIS — J449 Chronic obstructive pulmonary disease, unspecified: Secondary | ICD-10-CM | POA: Diagnosis present

## 2016-08-10 DIAGNOSIS — K5900 Constipation, unspecified: Secondary | ICD-10-CM | POA: Diagnosis not present

## 2016-08-10 DIAGNOSIS — E860 Dehydration: Secondary | ICD-10-CM | POA: Diagnosis not present

## 2016-08-10 DIAGNOSIS — J9601 Acute respiratory failure with hypoxia: Secondary | ICD-10-CM | POA: Diagnosis not present

## 2016-08-10 DIAGNOSIS — R1031 Right lower quadrant pain: Secondary | ICD-10-CM | POA: Diagnosis not present

## 2016-08-10 DIAGNOSIS — R112 Nausea with vomiting, unspecified: Secondary | ICD-10-CM | POA: Diagnosis not present

## 2016-08-10 DIAGNOSIS — R0902 Hypoxemia: Secondary | ICD-10-CM | POA: Diagnosis not present

## 2016-08-10 DIAGNOSIS — I480 Paroxysmal atrial fibrillation: Secondary | ICD-10-CM | POA: Diagnosis not present

## 2016-08-10 DIAGNOSIS — N179 Acute kidney failure, unspecified: Secondary | ICD-10-CM | POA: Diagnosis not present

## 2016-08-10 DIAGNOSIS — R410 Disorientation, unspecified: Secondary | ICD-10-CM

## 2016-08-10 DIAGNOSIS — N134 Hydroureter: Secondary | ICD-10-CM | POA: Diagnosis not present

## 2016-08-10 LAB — POCT URINALYSIS DIP (MANUAL ENTRY)
Bilirubin, UA: NEGATIVE
Blood, UA: NEGATIVE
Glucose, UA: NEGATIVE mg/dL
Leukocytes, UA: NEGATIVE
Nitrite, UA: NEGATIVE
Protein Ur, POC: NEGATIVE mg/dL
Spec Grav, UA: 1.025 (ref 1.010–1.025)
Urobilinogen, UA: 0.2 E.U./dL
pH, UA: 5.5 (ref 5.0–8.0)

## 2016-08-10 LAB — URINALYSIS, ROUTINE W REFLEX MICROSCOPIC
Bilirubin Urine: NEGATIVE
Glucose, UA: NEGATIVE mg/dL
Hgb urine dipstick: NEGATIVE
Ketones, ur: 5 mg/dL — AB
Leukocytes, UA: NEGATIVE
Nitrite: NEGATIVE
Protein, ur: NEGATIVE mg/dL
Specific Gravity, Urine: 1.019 (ref 1.005–1.030)
pH: 5 (ref 5.0–8.0)

## 2016-08-10 LAB — CBC WITH DIFFERENTIAL/PLATELET
Basophils Absolute: 0 10*3/uL (ref 0.0–0.1)
Basophils Relative: 0 %
Eosinophils Absolute: 0.1 10*3/uL (ref 0.0–0.7)
Eosinophils Relative: 1 %
HCT: 38.9 % (ref 36.0–46.0)
Hemoglobin: 12.5 g/dL (ref 12.0–15.0)
Lymphocytes Relative: 17 %
Lymphs Abs: 2.1 10*3/uL (ref 0.7–4.0)
MCH: 28.3 pg (ref 26.0–34.0)
MCHC: 32.1 g/dL (ref 30.0–36.0)
MCV: 88 fL (ref 78.0–100.0)
Monocytes Absolute: 1.5 10*3/uL — ABNORMAL HIGH (ref 0.1–1.0)
Monocytes Relative: 12 %
Neutro Abs: 8.6 10*3/uL — ABNORMAL HIGH (ref 1.7–7.7)
Neutrophils Relative %: 70 %
Platelets: 319 10*3/uL (ref 150–400)
RBC: 4.42 MIL/uL (ref 3.87–5.11)
RDW: 16.4 % — ABNORMAL HIGH (ref 11.5–15.5)
WBC: 12.3 10*3/uL — ABNORMAL HIGH (ref 4.0–10.5)

## 2016-08-10 LAB — COMPREHENSIVE METABOLIC PANEL
ALT: 17 U/L (ref 14–54)
AST: 37 U/L (ref 15–41)
Albumin: 3.9 g/dL (ref 3.5–5.0)
Alkaline Phosphatase: 82 U/L (ref 38–126)
Anion gap: 10 (ref 5–15)
BUN: 26 mg/dL — ABNORMAL HIGH (ref 6–20)
CO2: 23 mmol/L (ref 22–32)
Calcium: 9.4 mg/dL (ref 8.9–10.3)
Chloride: 109 mmol/L (ref 101–111)
Creatinine, Ser: 2.06 mg/dL — ABNORMAL HIGH (ref 0.44–1.00)
GFR calc Af Amer: 25 mL/min — ABNORMAL LOW (ref >60)
GFR calc non Af Amer: 22 mL/min — ABNORMAL LOW (ref >60)
Glucose, Bld: 66 mg/dL (ref 65–99)
Potassium: 4.6 mmol/L (ref 3.5–5.1)
Sodium: 142 mmol/L (ref 135–145)
Total Bilirubin: 0.6 mg/dL (ref 0.3–1.2)
Total Protein: 7.2 g/dL (ref 6.5–8.1)

## 2016-08-10 LAB — POCT CBC
Granulocyte percent: 73.9 %G (ref 37–80)
HCT, POC: 33.8 % — AB (ref 37.7–47.9)
Hemoglobin: 10.9 g/dL — AB (ref 12.2–16.2)
Lymph, poc: 2.4 (ref 0.6–3.4)
MCH, POC: 28.3 pg (ref 27–31.2)
MCHC: 32.2 g/dL (ref 31.8–35.4)
MCV: 87.9 fL (ref 80–97)
MID (cbc): 0.7 (ref 0–0.9)
MPV: 8.5 fL (ref 0–99.8)
POC Granulocyte: 8.8 — AB (ref 2–6.9)
POC LYMPH PERCENT: 20.3 %L (ref 10–50)
POC MID %: 5.8 %M (ref 0–12)
Platelet Count, POC: 321 10*3/uL (ref 142–424)
RBC: 3.84 M/uL — AB (ref 4.04–5.48)
RDW, POC: 16.4 %
WBC: 11.9 10*3/uL — AB (ref 4.6–10.2)

## 2016-08-10 LAB — LIPASE, BLOOD: Lipase: 34 U/L (ref 11–51)

## 2016-08-10 MED ORDER — PANTOPRAZOLE SODIUM 40 MG PO TBEC
40.0000 mg | DELAYED_RELEASE_TABLET | Freq: Every day | ORAL | Status: DC
  Administered 2016-08-12 – 2016-08-17 (×6): 40 mg via ORAL
  Filled 2016-08-10 (×6): qty 1

## 2016-08-10 MED ORDER — UMECLIDINIUM-VILANTEROL 62.5-25 MCG/INH IN AEPB
1.0000 | INHALATION_SPRAY | Freq: Every day | RESPIRATORY_TRACT | Status: DC
  Filled 2016-08-10 (×2): qty 14

## 2016-08-10 MED ORDER — ACETAMINOPHEN 650 MG RE SUPP: 650.0000 mg | Freq: Four times a day (QID) | RECTAL | Status: DC | PRN

## 2016-08-10 MED ORDER — METOPROLOL TARTRATE 25 MG PO TABS
12.5000 mg | ORAL_TABLET | Freq: Two times a day (BID) | ORAL | Status: DC
  Administered 2016-08-11 – 2016-08-17 (×12): 12.5 mg via ORAL
  Filled 2016-08-10 (×13): qty 1

## 2016-08-10 MED ORDER — FLECAINIDE ACETATE 50 MG PO TABS
50.0000 mg | ORAL_TABLET | Freq: Two times a day (BID) | ORAL | Status: DC
  Administered 2016-08-11 – 2016-08-17 (×12): 50 mg via ORAL
  Filled 2016-08-10 (×13): qty 1

## 2016-08-10 MED ORDER — CYANOCOBALAMIN 250 MCG PO TABS
500.0000 ug | ORAL_TABLET | Freq: Every day | ORAL | Status: DC
  Administered 2016-08-12 – 2016-08-17 (×6): 500 ug via ORAL
  Filled 2016-08-10 (×2): qty 2
  Filled 2016-08-10: qty 1
  Filled 2016-08-10 (×4): qty 2

## 2016-08-10 MED ORDER — FENTANYL CITRATE (PF) 100 MCG/2ML IJ SOLN
25.0000 ug | Freq: Once | INTRAMUSCULAR | Status: AC
  Administered 2016-08-10: 25 ug via INTRAVENOUS
  Filled 2016-08-10 (×2): qty 2

## 2016-08-10 MED ORDER — SODIUM CHLORIDE 0.9 % IV SOLN
INTRAVENOUS | Status: DC
  Administered 2016-08-11: 09:00:00 via INTRAVENOUS

## 2016-08-10 MED ORDER — VITAMIN D3 25 MCG (1000 UNIT) PO TABS
1000.0000 [IU] | ORAL_TABLET | Freq: Every day | ORAL | Status: DC
  Administered 2016-08-12 – 2016-08-17 (×5): 1000 [IU] via ORAL
  Filled 2016-08-10 (×7): qty 1

## 2016-08-10 MED ORDER — ONDANSETRON HCL 4 MG/2ML IJ SOLN
4.0000 mg | Freq: Once | INTRAMUSCULAR | Status: AC
  Administered 2016-08-10: 4 mg via INTRAVENOUS
  Filled 2016-08-10: qty 2

## 2016-08-10 MED ORDER — SODIUM CHLORIDE 0.9 % IV BOLUS (SEPSIS)
1000.0000 mL | Freq: Once | INTRAVENOUS | Status: AC
  Administered 2016-08-10: 1000 mL via INTRAVENOUS

## 2016-08-10 MED ORDER — ONDANSETRON HCL 4 MG PO TABS: 4.0000 mg | ORAL_TABLET | Freq: Four times a day (QID) | ORAL | Status: DC | PRN

## 2016-08-10 MED ORDER — CLONAZEPAM 0.5 MG PO TABS
0.5000 mg | ORAL_TABLET | Freq: Every day | ORAL | Status: DC
  Administered 2016-08-11 – 2016-08-16 (×5): 0.5 mg via ORAL
  Filled 2016-08-10 (×6): qty 1

## 2016-08-10 MED ORDER — ONDANSETRON HCL 4 MG/2ML IJ SOLN
4.0000 mg | Freq: Four times a day (QID) | INTRAMUSCULAR | Status: DC | PRN
  Administered 2016-08-11: 4 mg via INTRAVENOUS
  Filled 2016-08-10: qty 2

## 2016-08-10 MED ORDER — FENTANYL CITRATE (PF) 100 MCG/2ML IJ SOLN
25.0000 ug | Freq: Once | INTRAMUSCULAR | Status: AC
  Administered 2016-08-10: 25 ug via INTRAVENOUS
  Filled 2016-08-10: qty 2

## 2016-08-10 MED ORDER — ONDANSETRON HCL 4 MG/2ML IJ SOLN
4.0000 mg | Freq: Once | INTRAMUSCULAR | Status: AC
  Administered 2016-08-10: 4 mg via INTRAVENOUS
  Filled 2016-08-10 (×2): qty 2

## 2016-08-10 MED ORDER — ACETAMINOPHEN 325 MG PO TABS
650.0000 mg | ORAL_TABLET | Freq: Four times a day (QID) | ORAL | Status: DC | PRN
  Administered 2016-08-13 – 2016-08-15 (×3): 650 mg via ORAL
  Filled 2016-08-10 (×2): qty 2

## 2016-08-10 MED ORDER — BOOST PO LIQD
237.0000 mL | Freq: Every day | ORAL | Status: DC
  Administered 2016-08-12 – 2016-08-14 (×2): 237 mL via ORAL
  Filled 2016-08-10 (×6): qty 237

## 2016-08-10 MED ORDER — MORPHINE SULFATE (PF) 2 MG/ML IV SOLN: 1.0000 mg | INTRAVENOUS | Status: DC | PRN

## 2016-08-10 MED ORDER — METOCLOPRAMIDE HCL 5 MG/ML IJ SOLN: 10.0000 mg | Freq: Once | INTRAMUSCULAR | Status: DC

## 2016-08-10 NOTE — Progress Notes (Signed)
[name] 81 y.o.   Chief Complaint  Patient presents with  . Flank Pain    RIGHT x 2 days  . Emesis    HISTORY OF PRESENT ILLNESS:  This is a 81 y.o. female complaining of right flank/abdominal pain x 2 days; unable to eat due to vomiting.  HPI   Prior to Admission medications   Medication Sig Start Date End Date Taking? Authorizing Provider  aspirin 81 MG chewable tablet Chew 81 mg by mouth 2 (two) times daily.    Yes [provider]  Cholecalciferol ([employer] VITAMIN D3) 1000 UNITS capsule Take 1,000 Units by mouth daily.     Yes [provider]  clonazePAM (KLONOPIN) 0.5 MG tablet TAKE 1 OR 2 TABLETS AT BEDTIME 08/06/16  Yes Plotnikov, Evie Lacks, MD  cyanocobalamin (V-R VITAMIN B-12) 500 MCG tablet Take 1 tablet (500 mcg total) by mouth daily. 09/17/15  Yes Plotnikov, Evie Lacks, MD  flecainide (TAMBOCOR) 50 MG tablet TAKE 1 TABLET BY MOUTH TWICE DAILY 01/10/16  Yes Crenshaw, Denice Bors, MD  lactose free nutrition (BOOST) LIQD Take 237 mLs by mouth daily with breakfast.   Yes [provider]  LORazepam (ATIVAN) 1 MG tablet TAKE 1 OR 2 TABLETS AT BEDTIME AS NEEDED FOR SLEEP OR ANXIETY 08/06/16  Yes Plotnikov, Evie Lacks, MD  metoprolol tartrate (LOPRESSOR) 25 MG tablet Take 0.5 tablets (12.5 mg total) by mouth 2 (two) times daily. 03/08/16  Yes Plotnikov, Evie Lacks, MD  omeprazole (PRILOSEC) 20 MG capsule Take 20 mg by mouth daily.   Yes [provider]  umeclidinium-vilanterol (ANORO ELLIPTA) 62.5-25 MCG/INH AEPB Inhale 1 puff into the lungs daily. 04/03/16  Yes Plotnikov, Evie Lacks, MD    Allergies  Allergen Reactions  . Amoxicillin     "makes me crazy"  . Codeine     REACTION: Nausea  . Levaquin [Levofloxacin In D5w]     n/v  . Other     Pt does not want to take any pain meds    Patient Active Problem List   Diagnosis Date Noted  . Anemia 05/19/2016  . Generalized anxiety disorder 01/14/2016  . Dysphagia 04/16/2015  . Dry skin dermatitis  12/28/2014  . Atrophic vaginitis 05/26/2014  . Varicose veins of both lower extremities 12/12/2013  . Restless leg syndrome 03/17/2013  . Impacted cerumen of left ear 03/17/2013  . Osteoarthritis, hand 09/05/2011  . Epistaxis, recurrent 08/17/2011  . Well adult exam 01/31/2011  . Low back pain 01/31/2011  . Palpitations 05/24/2010  . CERUMEN IMPACTION 02/23/2010  . PHARYNGITIS, ACUTE 02/23/2010  . SINUSITIS, ACUTE 01/07/2010  . Shortness of breath 09/23/2009  . BRONCHITIS, ACUTE 08/03/2009  . Osteoarthritis 07/06/2009  . TOBACCO USE, QUIT 03/08/2009  . ALLERGIC RHINITIS 11/06/2008  . NAUSEA ALONE 08/19/2008  . DIARRHEA 08/19/2008  . Essential hypertension 07/15/2008  . EMPHYSEMA 07/15/2008  . PEPTIC ULCER DISEASE 07/15/2008  . ARTHRITIS 07/15/2008  . HEADACHE 05/25/2008  . SYNCOPE 03/23/2008  . WEIGHT LOSS, ABNORMAL 03/23/2008  . HEARING IMPAIRMENT 12/23/2007  . B12 deficiency 10/14/2007  . Malaise 10/14/2007  . INSOMNIA, PERSISTENT 07/31/2007  . Anxiety state 03/01/2007  . DEPRESSION 03/01/2007  . Atrial fibrillation ([place]) 02/26/2007  . VITAMIN D DEFICIENCY 02/19/2007  . GERD 02/19/2007  . COPD with chronic bronchitis ([place]) 08/07/2006    Past Medical History:  Diagnosis Date  . Anxiety   . Arthritis   . Atrial fibrillation ([place])   . B12 deficiency   . COPD (chronic  obstructive pulmonary disease) ([place])   . Depression   . GERD (gastroesophageal reflux disease)   . Hypertension   . Osteoarthrosis, hand    both hands  . Peptic ulcer, unspecified site, unspecified as acute or chronic, without mention of hemorrhage, perforation, or obstruction   . Pneumonia   . Vitamin D deficiency disease     Past Surgical History:  Procedure Laterality Date  . BLADDER SURGERY     Bladder tack and intestines  . CYSTOCELE REPAIR    . SPLENECTOMY      Social History   Social History  . Marital status: Divorced    Spouse name: N/A  . Number of children: 2  . Years of  education: N/A   Occupational History  . cook [employer]   Social History Main Topics  . Smoking status: Former Research scientist (life sciences)  . Smokeless tobacco: Never Used  . Alcohol use No  . Drug use: No  . Sexual activity: Not Currently   Other Topics Concern  . Not on file   Social History Narrative   Retired, works part time   Divorced   Former Smoker   1- Son alcoholic abusive   Daughter died in 06/07/06    Family History  Problem Relation Age of Onset  . Heart disease Father   . Arthritis Unknown        Family history of  . Coronary artery disease Unknown        Family history of  . Hypertension Unknown        Family history of  . Diabetes Daughter        44 died     Review of Systems  Constitutional: Positive for malaise/fatigue. Negative for chills and fever.  HENT: Negative.  Negative for congestion and sore throat.   Eyes: Negative.  Negative for blurred vision and double vision.  Respiratory: Negative.  Negative for cough and shortness of breath.   Cardiovascular: Negative.  Negative for chest pain and palpitations.  Gastrointestinal: Positive for abdominal pain, nausea and vomiting. Negative for blood in stool, diarrhea and melena.  Genitourinary: Positive for flank pain. Negative for dysuria and hematuria.  Musculoskeletal: Negative for myalgias and neck pain.  Skin: Negative for rash.  Neurological: Positive for weakness. Negative for dizziness and headaches.  Endo/Heme/Allergies: Negative.   All other systems reviewed and are negative.  Vitals:   08/10/16 1502 08/10/16 1550  BP: (!) 161/60 (!) 180/64  Pulse: 68   Resp: 16   Temp: 98.6 F (37 C)       Results for orders placed or performed in visit on 08/10/16 (from the past 24 hour(s))  POCT urinalysis dipstick     Status: Abnormal   Collection Time: 08/10/16  3:38 PM  Result Value Ref Range   Color, UA yellow yellow   Clarity, UA clear clear   Glucose, UA negative negative mg/dL   Bilirubin, UA negative  negative   Ketones, POC UA trace (5) (A) negative mg/dL   Spec Grav, UA 1.025 1.010 - 1.025   Blood, UA negative negative   pH, UA 5.5 5.0 - 8.0   Protein Ur, POC negative negative mg/dL   Urobilinogen, UA 0.2 0.2 or 1.0 E.U./dL   Nitrite, UA Negative Negative   Leukocytes, UA Negative Negative  POCT CBC     Status: Abnormal   Collection Time: 08/10/16  3:51 PM  Result Value Ref Range   WBC 11.9 (A) 4.6 - 10.2 K/uL   Lymph, poc  2.4 0.6 - 3.4   POC LYMPH PERCENT 20.3 10 - 50 %L   MID (cbc) 0.7 0 - 0.9   POC MID % 5.8 0 - 12 %M   POC Granulocyte 8.8 (A) 2 - 6.9   Granulocyte percent 73.9 37 - 80 %G   RBC 3.84 (A) 4.04 - 5.48 M/uL   Hemoglobin 10.9 (A) 12.2 - 16.2 g/dL   HCT, POC 33.8 (A) 37.7 - 47.9 %   MCV 87.9 80 - 97 fL   MCH, POC 28.3 27 - 31.2 pg   MCHC 32.2 31.8 - 35.4 g/dL   RDW, POC 16.4 %   Platelet Count, POC 321 142 - 424 K/uL   MPV 8.5 0 - 99.8 fL     Physical Exam  Constitutional: She is oriented to person, place, and time. She appears well-developed and well-nourished.  HENT:  Head: Normocephalic.  Nose: Nose normal.  Mouth/Throat: Oropharynx is clear and moist. No oropharyngeal exudate.  Eyes: Conjunctivae and EOM are normal. Pupils are equal, round, and reactive to light.  Neck: Normal range of motion. Neck supple. No JVD present.  Cardiovascular: Normal rate, regular rhythm and normal heart sounds.   Pulmonary/Chest: Effort normal and breath sounds normal.  Abdominal: There is tenderness. There is guarding.  Extremely tender right hemiabdomen RUQ>RLQ with guarding.  Musculoskeletal: Normal range of motion.  Lymphadenopathy:    She has no cervical adenopathy.  Neurological: She is alert and oriented to person, place, and time. No sensory deficit. She exhibits normal muscle tone.  Skin: Skin is warm and dry. Capillary refill takes less than 2 seconds.  Psychiatric: She has a normal mood and affect. Her behavior is normal.  Vitals  reviewed.    ASSESSMENT & PLAN: [name] was seen today for flank pain and emesis.  Diagnoses and all orders for this visit:  Acute abdomen  Flank pain -     POCT urinalysis dipstick -     POCT CBC -     Comprehensive metabolic panel -     DG Abd Acute W/Chest; Future  Right upper quadrant abdominal tenderness without rebound tenderness -     POCT urinalysis dipstick -     POCT CBC -     Comprehensive metabolic panel -     DG Abd Acute W/Chest; Future  Leukocytosis, unspecified type     Patient Instructions    To ER now.   IF you received an x-ray today, you will receive an invoice from [facility]. Please contact [facility] at [phone] with questions or concerns regarding your invoice.   IF you received labwork today, you will receive an invoice from [place]. Please contact [facility] at [phone] with questions or concerns regarding your invoice.   Our billing staff will not be able to assist you with questions regarding bills from these companies.  You will be contacted with the lab results as soon as they are available. The fastest way to get your results is to activate your My Chart account. Instructions are located on the last page of this paperwork. If you have not heard from [place] regarding the results in 2 weeks, please contact this office.    We recommend that you schedule a mammogram for breast cancer screening. Typically, you do not need a referral to do this. Please contact a local imaging center to schedule your mammogram.  [facility] - [phone]  *ask for the Radiology [facility]) - [phone]  or [phone]  [facility] - [phone] [place] [phone] [facility] - [phone]  *ask for the [facility] - [phone]  *ask for the Radiology Department [facility] - [phone]   *ask for the Mammography Department [facility] - [phone]     Agustina Caroli, MD [facility]

## 2016-08-10 NOTE — ED Triage Notes (Signed)
Patient brought in by sister with complaints of right sided abdominal pain, nausea/vomting x2 days. Currently no nausea/vomting. Reports that her dr sent here.

## 2016-08-10 NOTE — ED Provider Notes (Signed)
[facility] DEPT Provider Note   CSN: [id number] Arrival date & time: 08/10/16  1710     History   Chief Complaint Chief Complaint  Patient presents with  . Abdominal Pain  . Nausea  . Emesis    HPI [name] is a 81 y.o. female.  HPI   3 days Of right-sided abdominal pain. Reports his in the right upper quadrant, right side, and right lower quadrant. Describes it as sharp, radiating towards her back. Pain is severe, nothing makes it better or worse. Reports she's had associated nausea and vomiting. Reports she vomited all day yesterday. Reports some constipation. Denies fevers, dysuria. Denies history of abdominal surgeries, history of nephrolithiasis. Was seen at the urgent care and sent here for further evaluation. Reports taking a lot of BC powders.  Past Medical History:  Diagnosis Date  . Anxiety   . Arthritis   . Atrial fibrillation ([place])   . B12 deficiency   . COPD (chronic obstructive pulmonary disease) ([place])   . Depression   . GERD (gastroesophageal reflux disease)   . Hypertension   . Osteoarthrosis, hand    both hands  . Peptic ulcer, unspecified site, unspecified as acute or chronic, without mention of hemorrhage, perforation, or obstruction   . Pneumonia   . Vitamin D deficiency disease     Patient Active Problem List   Diagnosis Date Noted  . Flank pain 08/10/2016  . Right upper quadrant abdominal tenderness without rebound tenderness 08/10/2016  . Leukocytosis 08/10/2016  . Acute abdomen 08/10/2016  . Intractable nausea and vomiting 08/10/2016  . ARF (acute renal failure) ([place]) 08/10/2016  . Urolithiasis 08/10/2016  . Abdominal pain 08/10/2016  . Anemia 05/19/2016  . Generalized anxiety disorder 01/14/2016  . Dysphagia 04/16/2015  . Dry skin dermatitis 12/28/2014  . Atrophic vaginitis 05/26/2014  . Varicose veins of both lower extremities 12/12/2013  . Restless leg syndrome 03/17/2013  . Impacted cerumen of left ear 03/17/2013  .  Osteoarthritis, hand 09/05/2011  . Epistaxis, recurrent 08/17/2011  . Well adult exam 01/31/2011  . Low back pain 01/31/2011  . Palpitations 05/24/2010  . CERUMEN IMPACTION 02/23/2010  . PHARYNGITIS, ACUTE 02/23/2010  . SINUSITIS, ACUTE 01/07/2010  . Shortness of breath 09/23/2009  . BRONCHITIS, ACUTE 08/03/2009  . Osteoarthritis 07/06/2009  . TOBACCO USE, QUIT 03/08/2009  . ALLERGIC RHINITIS 11/06/2008  . NAUSEA ALONE 08/19/2008  . DIARRHEA 08/19/2008  . Essential hypertension 07/15/2008  . EMPHYSEMA 07/15/2008  . PEPTIC ULCER DISEASE 07/15/2008  . ARTHRITIS 07/15/2008  . HEADACHE 05/25/2008  . SYNCOPE 03/23/2008  . WEIGHT LOSS, ABNORMAL 03/23/2008  . HEARING IMPAIRMENT 12/23/2007  . B12 deficiency 10/14/2007  . Malaise 10/14/2007  . INSOMNIA, PERSISTENT 07/31/2007  . Anxiety state 03/01/2007  . DEPRESSION 03/01/2007  . Atrial fibrillation ([place]) 02/26/2007  . VITAMIN D DEFICIENCY 02/19/2007  . GERD 02/19/2007  . COPD with chronic bronchitis ([place]) 08/07/2006    Past Surgical History:  Procedure Laterality Date  . BLADDER SURGERY     Bladder tack and intestines  . CYSTOCELE REPAIR    . SPLENECTOMY      OB History    No data available       Home Medications    Prior to Admission medications   Medication Sig Start Date End Date Taking? Authorizing Provider  aspirin 81 MG chewable tablet Chew 81 mg by mouth 2 (two) times daily.    Yes [provider]  Cholecalciferol ([employer] VITAMIN D3) 1000 UNITS capsule Take 1,000  Units by mouth daily.     Yes [provider]  clonazePAM (KLONOPIN) 0.5 MG tablet TAKE 1 OR 2 TABLETS AT BEDTIME 08/06/16  Yes Plotnikov, Evie Lacks, MD  cyanocobalamin (V-R VITAMIN B-12) 500 MCG tablet Take 1 tablet (500 mcg total) by mouth daily. 09/17/15  Yes Plotnikov, Evie Lacks, MD  flecainide (TAMBOCOR) 50 MG tablet TAKE 1 TABLET BY MOUTH TWICE DAILY 01/10/16  Yes Crenshaw, Denice Bors, MD  lactose free nutrition (BOOST) LIQD Take 237 mLs  by mouth daily with breakfast.   Yes [provider]  LORazepam (ATIVAN) 1 MG tablet TAKE 1 OR 2 TABLETS AT BEDTIME AS NEEDED FOR SLEEP OR ANXIETY 08/06/16  Yes Plotnikov, Evie Lacks, MD  metoprolol tartrate (LOPRESSOR) 25 MG tablet Take 0.5 tablets (12.5 mg total) by mouth 2 (two) times daily. 03/08/16  Yes Plotnikov, Evie Lacks, MD  omeprazole (PRILOSEC) 20 MG capsule Take 20 mg by mouth daily.   Yes [provider]  umeclidinium-vilanterol (ANORO ELLIPTA) 62.5-25 MCG/INH AEPB Inhale 1 puff into the lungs daily. 04/03/16  Yes Plotnikov, Evie Lacks, MD    Family History Family History  Problem Relation Age of Onset  . Heart disease Father   . Arthritis Unknown        Family history of  . Coronary artery disease Unknown        Family history of  . Hypertension Unknown        Family history of  . Diabetes Daughter        110 died    Social History Social History  Substance Use Topics  . Smoking status: Former Research scientist (life sciences)  . Smokeless tobacco: Never Used  . Alcohol use No     Allergies   Amoxicillin; Codeine; Levaquin [levofloxacin in d5w]; and Other   Review of Systems Review of Systems  Constitutional: Negative for fever.  HENT: Negative for sore throat.   Eyes: Negative for visual disturbance.  Respiratory: Negative for cough and shortness of breath.   Cardiovascular: Negative for chest pain.  Gastrointestinal: Positive for abdominal pain, nausea and vomiting. Negative for constipation and diarrhea.  Genitourinary: Positive for flank pain. Negative for difficulty urinating and dysuria.  Musculoskeletal: Negative for back pain and neck pain.  Skin: Negative for rash.  Neurological: Negative for syncope and headaches.     Physical Exam Updated Vital Signs BP (!) 160/55 (BP Location: Left Arm)   Pulse 72   Temp 97.7 F (36.5 C) (Oral)   Resp 16   Ht 5\' 3"  (1.6 m)   SpO2 92%   BMI 20.02 kg/m   Physical Exam  Constitutional: She is oriented to person,  place, and time. She appears well-developed and well-nourished. No distress.  HENT:  Head: Normocephalic and atraumatic.  Eyes: Conjunctivae and EOM are normal.  Neck: Normal range of motion.  Cardiovascular: Normal rate, regular rhythm, normal heart sounds and intact distal pulses.  Exam reveals no gallop and no friction rub.   No murmur heard. Pulmonary/Chest: Effort normal and breath sounds normal. No respiratory distress. She has no wheezes. She has no rales.  Abdominal: Soft. She exhibits no distension. There is tenderness. There is guarding.  Musculoskeletal: She exhibits no edema or tenderness.  Neurological: She is alert and oriented to person, place, and time.  Skin: Skin is warm and dry. No rash noted. She is not diaphoretic. No erythema.  Nursing note and vitals reviewed.    ED Treatments / Results  Labs (all labs ordered are listed,  but only abnormal results are displayed) Labs Reviewed  CBC WITH DIFFERENTIAL/PLATELET - Abnormal; Notable for the following:       Result Value   WBC 12.3 (*)    RDW 16.4 (*)    Neutro Abs 8.6 (*)    Monocytes Absolute 1.5 (*)    All other components within normal limits  COMPREHENSIVE METABOLIC PANEL - Abnormal; Notable for the following:    BUN 26 (*)    Creatinine, Ser 2.06 (*)    GFR calc non Af Amer 22 (*)    GFR calc Af Amer 25 (*)    All other components within normal limits  URINALYSIS, ROUTINE W REFLEX MICROSCOPIC - Abnormal; Notable for the following:    Ketones, ur 5 (*)    All other components within normal limits  LIPASE, BLOOD  COMPREHENSIVE METABOLIC PANEL  CBC WITH DIFFERENTIAL/PLATELET    EKG  EKG Interpretation None       Radiology Dg Abd Acute W/chest  Result Date: 08/10/2016 CLINICAL DATA:  Abdominal pain EXAM: DG ABDOMEN ACUTE W/ 1V CHEST COMPARISON:  Chest radiograph April 03, 2016 FINDINGS: PA chest: There is stable apical pleural thickening bilaterally. There is no edema or consolidation. Heart is  borderline enlarged with pulmonary vascularity within normal limits. There is extensive aortic atherosclerotic calcification. No adenopathy. Supine and upright abdomen: There is no bowel dilatation or air-fluid level to suggest bowel obstruction. No free air. There are surgical clips in the left upper quadrant. There is aortoiliac atherosclerosis. There is thoracolumbar dextroscoliosis. IMPRESSION: No bowel obstruction or free air. Surgical clips left upper quadrant. Apical pleural thickening bilaterally, stable. No edema or consolidation. Stable cardiac silhouette. There is aortoiliac atherosclerosis. Electronically Signed   By: Lowella Grip III M.D.   On: 08/10/2016 16:09   Ct Renal Stone Study  Result Date: 08/10/2016 CLINICAL DATA:  Right sided abdominal pain with nausea and vomiting for 2 days EXAM: CT ABDOMEN AND PELVIS WITHOUT CONTRAST TECHNIQUE: Multidetector CT imaging of the abdomen and pelvis was performed following the standard protocol without IV contrast. COMPARISON:  08/10/2016 radiograph, CT 01/30/2005 FINDINGS: Lower chest: Mild emphysema at the lung bases with scarring at the lingula and left base. No acute consolidation or effusion. There is moderate cardiomegaly. Densely calcified distal thoracic aorta. Hepatobiliary: No focal liver abnormality is seen. No gallstones, gallbladder wall thickening, or biliary dilatation. Pancreas: Unremarkable. No pancreatic ductal dilatation or surrounding inflammatory changes. Spleen: Status post splenectomy. Probable splenule in the left upper quadrant. Adjacent dystrophic calcification. Adrenals/Urinary Tract: Adrenal glands are within normal limits. 2 mm stone upper pole left kidney. Enlarged right kidney with moderate hydronephrosis and proximal hydroureter. This is secondary a 4 mm AP x 6 mm craniocaudad stone in the proximal right ureter, several cm past the right UPJ. Urinary bladder is empty Stomach/Bowel: Stomach nonenlarged. No dilated small  bowel. No colon wall thickening. Colon diverticular disease without acute inflammation. Suspect prior appendectomy. Vascular/Lymphatic: Densely calcified aorta and branch vessels. No aneurysmal dilatation. No significantly enlarged lymph nodes. Reproductive: Status post hysterectomy. No adnexal masses. Other: Small free fluid in the pelvis.  No free air. Musculoskeletal: Heterogenous attenuation of the vertebral bodies with mixed sclerotic and lucent lesions. No acute osseous abnormality. IMPRESSION: 1. Moderate to marked right hydronephrosis and proximal hydroureter, secondary to a 4 x 6 mm stone in the proximal right ureter several cm past the right UPJ. 2. Small amount of free fluid in the pelvis 3. Extensive atherosclerotic calcific disease of the aorta 4.  Cardiomegaly 5. Status post splenectomy with probable splenule in the left upper quadrant 6. Heterogeneous attenuation of the vertebral bodies, could relate to osteopenia although marrow infiltrative process is also a consideration. Suggest correlation with nonemergent MRI. Electronically Signed   By: Donavan Foil M.D.   On: 08/10/2016 19:43    Procedures Procedures (including critical care time)  Medications Ordered in ED Medications  clonazePAM (KLONOPIN) tablet 0.5 mg (not administered)  umeclidinium-vilanterol (ANORO ELLIPTA) 62.5-25 MCG/INH 1 puff (not administered)  metoprolol tartrate (LOPRESSOR) tablet 12.5 mg (not administered)  flecainide (TAMBOCOR) tablet 50 mg (not administered)  cyanocobalamin tablet 500 mcg (not administered)  lactose free nutrition (Boost) liquid 237 mL (not administered)  pantoprazole (PROTONIX) EC tablet 40 mg (not administered)  Cholecalciferol 1,000 Units (not administered)  acetaminophen (TYLENOL) tablet 650 mg (not administered)    Or  acetaminophen (TYLENOL) suppository 650 mg (not administered)  ondansetron (ZOFRAN) tablet 4 mg (not administered)    Or  ondansetron (ZOFRAN) injection 4 mg (not  administered)  0.9 %  sodium chloride infusion (not administered)  morphine 2 MG/ML injection 1 mg (not administered)  ondansetron (ZOFRAN) injection 4 mg (4 mg Intravenous Given 08/10/16 2027)  fentaNYL (SUBLIMAZE) injection 25 mcg (25 mcg Intravenous Given 08/10/16 2026)  sodium chloride 0.9 % bolus 1,000 mL (0 mLs Intravenous Stopped 08/10/16 2130)  sodium chloride 0.9 % bolus 1,000 mL (1,000 mLs Intravenous New Bag/Given 08/10/16 2201)  ondansetron (ZOFRAN) injection 4 mg (4 mg Intravenous Given 08/10/16 2202)  fentaNYL (SUBLIMAZE) injection 25 mcg (25 mcg Intravenous Given 08/10/16 2202)     Initial Impression / Assessment and Plan / ED Course  I have reviewed the triage vital signs and the nursing notes.  Pertinent labs & imaging results that were available during my care of the patient were reviewed by me and considered in my medical decision making (see chart for details).     81 year old female with a history of COPD, hypertension, atrial fibrillation presents to concern of right-sided abdominal pain for 3 days, nausea and vomiting. CT stone study shows a 4 x 6 mm obstructing stone in the right ureter causing severe hydronephrosis. Analysis shows no sign of infection. Creatinine has increased from 0.96 to 2.06 since March. Discussed with Dr. Alyson Ingles of urology.  Reports if symptoms are controlled, she can follow up with urology tomorrow.  Patient given 2 L of normal saline, 4 mg of Zofran x2, fentanyl 50mcg x2.  Reports pain is now improved, however is noted to have desaturation secondary to pain medication, and reports that her nausea and vomiting is continuing despite nausea medications.  Given reglan.  Will admit to hospital given continued nausea/vomiting in setting of acute kidney injury, which is multifactorial secondary to obstructive phenomena and dehydration.  Urology to evaluate patient in the AM. Admitted to hospitalist.   Final Clinical Impressions(s) / ED Diagnoses   Final  diagnoses:  Intractable vomiting with nausea, unspecified vomiting type  Right nephrolithiasis  Hydronephrosis of right kidney  Acute kidney injury [facility])    New Prescriptions New Prescriptions   No medications on file     Gareth Morgan, MD 08/11/16 [phone]

## 2016-08-10 NOTE — Patient Instructions (Addendum)
  To ER now.   IF you received an x-ray today, you will receive an invoice from [facility]. Please contact [facility] at [phone] with questions or concerns regarding your invoice.   IF you received labwork today, you will receive an invoice from [place]. Please contact [facility] at [phone] with questions or concerns regarding your invoice.   Our billing staff will not be able to assist you with questions regarding bills from these companies.  You will be contacted with the lab results as soon as they are available. The fastest way to get your results is to activate your My Chart account. Instructions are located on the last page of this paperwork. If you have not heard from [place] regarding the results in 2 weeks, please contact this office.    We recommend that you schedule a mammogram for breast cancer screening. Typically, you do not need a referral to do this. Please contact a local imaging center to schedule your mammogram.  [facility] - [phone]  *ask for the Radiology Department [facility] ([place]) - [phone] or [phone]  [facility] - [phone] [place] [phone] [facility] - [phone]  *ask for the [facility] - [phone]  *ask for the Radiology Department [facility] - [phone]  *ask for the [place] - [phone]

## 2016-08-11 ENCOUNTER — Observation Stay (HOSPITAL_COMMUNITY): Payer: Medicare Other | Admitting: Certified Registered Nurse Anesthetist

## 2016-08-11 ENCOUNTER — Observation Stay (HOSPITAL_COMMUNITY): Payer: Medicare Other

## 2016-08-11 ENCOUNTER — Other Ambulatory Visit: Payer: Self-pay | Admitting: Urology

## 2016-08-11 ENCOUNTER — Encounter (HOSPITAL_COMMUNITY)
Admission: EM | Disposition: A | Discharge status: Home / Self Care | Payer: Self-pay | Source: Home / Self Care | Attending: Internal Medicine

## 2016-08-11 ENCOUNTER — Encounter (HOSPITAL_COMMUNITY): Payer: Self-pay | Admitting: Emergency Medicine

## 2016-08-11 DIAGNOSIS — N209 Urinary calculus, unspecified: Secondary | ICD-10-CM | POA: Diagnosis not present

## 2016-08-11 DIAGNOSIS — R112 Nausea with vomiting, unspecified: Secondary | ICD-10-CM | POA: Diagnosis not present

## 2016-08-11 DIAGNOSIS — N179 Acute kidney failure, unspecified: Secondary | ICD-10-CM | POA: Diagnosis not present

## 2016-08-11 DIAGNOSIS — N201 Calculus of ureter: Secondary | ICD-10-CM | POA: Diagnosis not present

## 2016-08-11 HISTORY — PX: CYSTOSCOPY WITH STENT PLACEMENT: SHX5790

## 2016-08-11 LAB — CBC WITH DIFFERENTIAL/PLATELET
Basophils Absolute: 0 10*3/uL (ref 0.0–0.1)
Basophils Relative: 0 %
Eosinophils Absolute: 0 10*3/uL (ref 0.0–0.7)
Eosinophils Relative: 0 %
HCT: 32.4 % — ABNORMAL LOW (ref 36.0–46.0)
Hemoglobin: 10.4 g/dL — ABNORMAL LOW (ref 12.0–15.0)
Lymphocytes Relative: 10 %
Lymphs Abs: 1.4 10*3/uL (ref 0.7–4.0)
MCH: 28.6 pg (ref 26.0–34.0)
MCHC: 32.1 g/dL (ref 30.0–36.0)
MCV: 89 fL (ref 78.0–100.0)
Monocytes Absolute: 1.4 10*3/uL — ABNORMAL HIGH (ref 0.1–1.0)
Monocytes Relative: 10 %
Neutro Abs: 11.2 10*3/uL — ABNORMAL HIGH (ref 1.7–7.7)
Neutrophils Relative %: 80 %
Platelets: 322 10*3/uL (ref 150–400)
RBC: 3.64 MIL/uL — ABNORMAL LOW (ref 3.87–5.11)
RDW: 16.6 % — ABNORMAL HIGH (ref 11.5–15.5)
WBC: 14.1 10*3/uL — ABNORMAL HIGH (ref 4.0–10.5)

## 2016-08-11 LAB — COMPREHENSIVE METABOLIC PANEL
ALT: 17 IU/L (ref 0–32)
ALT: 14 U/L (ref 14–54)
AST: 35 IU/L (ref 0–40)
AST: 36 U/L (ref 15–41)
Albumin/Globulin Ratio: 1.7 (ref 1.2–2.2)
Albumin: 4 g/dL (ref 3.5–4.8)
Albumin: 3.4 g/dL — ABNORMAL LOW (ref 3.5–5.0)
Alkaline Phosphatase: 86 IU/L (ref 39–117)
Alkaline Phosphatase: 72 U/L (ref 38–126)
Anion gap: 11 (ref 5–15)
BUN/Creatinine Ratio: 13 (ref 12–28)
BUN: 26 mg/dL (ref 8–27)
BUN: 28 mg/dL — ABNORMAL HIGH (ref 6–20)
Bilirubin Total: 0.2 mg/dL (ref 0.0–1.2)
CO2: 23 mmol/L (ref 18–29)
CO2: 21 mmol/L — ABNORMAL LOW (ref 22–32)
Calcium: 9.5 mg/dL (ref 8.7–10.3)
Calcium: 8.6 mg/dL — ABNORMAL LOW (ref 8.9–10.3)
Chloride: 107 mmol/L — ABNORMAL HIGH (ref 96–106)
Chloride: 110 mmol/L (ref 101–111)
Creatinine, Ser: 2.02 mg/dL — ABNORMAL HIGH (ref 0.57–1.00)
Creatinine, Ser: 1.81 mg/dL — ABNORMAL HIGH (ref 0.44–1.00)
GFR calc Af Amer: 27 mL/min/{1.73_m2} — ABNORMAL LOW (ref >59)
GFR calc Af Amer: 30 mL/min — ABNORMAL LOW (ref >60)
GFR calc non Af Amer: 23 mL/min/{1.73_m2} — ABNORMAL LOW (ref >59)
GFR calc non Af Amer: 26 mL/min — ABNORMAL LOW (ref >60)
Globulin, Total: 2.4 g/dL (ref 1.5–4.5)
Glucose, Bld: 84 mg/dL (ref 65–99)
Glucose: 75 mg/dL (ref 65–99)
Potassium: 5.2 mmol/L (ref 3.5–5.2)
Potassium: 4.8 mmol/L (ref 3.5–5.1)
Sodium: 145 mmol/L — ABNORMAL HIGH (ref 134–144)
Sodium: 142 mmol/L (ref 135–145)
Total Bilirubin: 0.6 mg/dL (ref 0.3–1.2)
Total Protein: 6.4 g/dL (ref 6.0–8.5)
Total Protein: 6.2 g/dL — ABNORMAL LOW (ref 6.5–8.1)

## 2016-08-11 SURGERY — CYSTOSCOPY, WITH STENT INSERTION
Anesthesia: General | Laterality: Right

## 2016-08-11 MED ORDER — ACETAMINOPHEN 10 MG/ML IV SOLN
1000.0000 mg | Freq: Four times a day (QID) | INTRAVENOUS | Status: AC | PRN
  Administered 2016-08-11: 1000 mg via INTRAVENOUS
  Filled 2016-08-11: qty 100

## 2016-08-11 MED ORDER — PROMETHAZINE HCL 25 MG/ML IJ SOLN
6.2500 mg | Freq: Four times a day (QID) | INTRAMUSCULAR | Status: DC | PRN
  Administered 2016-08-11: 6.25 mg via INTRAVENOUS
  Filled 2016-08-11: qty 1

## 2016-08-11 MED ORDER — METOCLOPRAMIDE HCL 5 MG/ML IJ SOLN
INTRAMUSCULAR | Status: DC | PRN
  Administered 2016-08-11: 10 mg via INTRAVENOUS

## 2016-08-11 MED ORDER — HYDRALAZINE HCL 20 MG/ML IJ SOLN
10.0000 mg | Freq: Four times a day (QID) | INTRAMUSCULAR | Status: DC | PRN
  Administered 2016-08-13 – 2016-08-15 (×2): 10 mg via INTRAVENOUS
  Filled 2016-08-11 (×3): qty 0.5

## 2016-08-11 MED ORDER — DEXAMETHASONE SODIUM PHOSPHATE 10 MG/ML IJ SOLN
INTRAMUSCULAR | Status: DC | PRN
  Administered 2016-08-11: 10 mg via INTRAVENOUS

## 2016-08-11 MED ORDER — ONDANSETRON HCL 4 MG/2ML IJ SOLN
INTRAMUSCULAR | Status: DC | PRN
  Administered 2016-08-11: 4 mg via INTRAVENOUS

## 2016-08-11 MED ORDER — CEFAZOLIN SODIUM-DEXTROSE 2-3 GM-% IV SOLR
INTRAVENOUS | Status: DC | PRN
  Administered 2016-08-11: 2 g via INTRAVENOUS

## 2016-08-11 MED ORDER — MEPERIDINE HCL 50 MG/ML IJ SOLN: 6.2500 mg | INTRAMUSCULAR | Status: DC | PRN

## 2016-08-11 MED ORDER — FENTANYL CITRATE (PF) 100 MCG/2ML IJ SOLN
INTRAMUSCULAR | Status: DC | PRN
Start: 2016-08-11 — End: 2016-08-11
  Administered 2016-08-11: 25 ug via INTRAVENOUS

## 2016-08-11 MED ORDER — LIDOCAINE HCL (CARDIAC) 20 MG/ML IV SOLN
INTRAVENOUS | Status: DC | PRN
  Administered 2016-08-11: 60 mg via INTRAVENOUS

## 2016-08-11 MED ORDER — FENTANYL CITRATE (PF) 100 MCG/2ML IJ SOLN
INTRAMUSCULAR | Status: AC
  Filled 2016-08-11: qty 2

## 2016-08-11 MED ORDER — LIDOCAINE 2% (20 MG/ML) 5 ML SYRINGE
INTRAMUSCULAR | Status: AC
  Filled 2016-08-11: qty 5

## 2016-08-11 MED ORDER — LACTATED RINGERS IV SOLN: INTRAVENOUS | Status: DC

## 2016-08-11 MED ORDER — PROPOFOL 10 MG/ML IV BOLUS
INTRAVENOUS | Status: DC | PRN
Start: 2016-08-11 — End: 2016-08-11
  Administered 2016-08-11: 100 mg via INTRAVENOUS

## 2016-08-11 MED ORDER — SODIUM CHLORIDE 0.9 % IV SOLN
INTRAVENOUS | Status: DC | PRN
  Administered 2016-08-11: 14:00:00 via INTRAVENOUS

## 2016-08-11 MED ORDER — DEXTROSE-NACL 5-0.9 % IV SOLN
INTRAVENOUS | Status: DC
  Administered 2016-08-11 – 2016-08-12 (×4): via INTRAVENOUS

## 2016-08-11 MED ORDER — MIDAZOLAM HCL 2 MG/2ML IJ SOLN
INTRAMUSCULAR | Status: AC
  Filled 2016-08-11: qty 2

## 2016-08-11 MED ORDER — PROPOFOL 10 MG/ML IV BOLUS
INTRAVENOUS | Status: AC
  Filled 2016-08-11: qty 20

## 2016-08-11 MED ORDER — CEFAZOLIN SODIUM-DEXTROSE 2-4 GM/100ML-% IV SOLN
INTRAVENOUS | Status: AC
  Filled 2016-08-11: qty 100

## 2016-08-11 MED ORDER — DEXAMETHASONE SODIUM PHOSPHATE 10 MG/ML IJ SOLN
INTRAMUSCULAR | Status: AC
  Filled 2016-08-11: qty 1

## 2016-08-11 MED ORDER — ONDANSETRON HCL 4 MG/2ML IJ SOLN
INTRAMUSCULAR | Status: AC
  Filled 2016-08-11: qty 2

## 2016-08-11 MED ORDER — ONDANSETRON HCL 4 MG/2ML IJ SOLN: 4.0000 mg | Freq: Once | INTRAMUSCULAR | Status: DC | PRN

## 2016-08-11 MED ORDER — SODIUM CHLORIDE 0.9 % IR SOLN
Status: DC | PRN
  Administered 2016-08-11: 4000 mL via INTRAVESICAL

## 2016-08-11 MED ORDER — IOHEXOL 300 MG/ML  SOLN
INTRAMUSCULAR | Status: DC | PRN
  Administered 2016-08-11: 10 mL via URETHRAL

## 2016-08-11 MED ORDER — LACTATED RINGERS IV SOLN
INTRAVENOUS | Status: DC | PRN
  Administered 2016-08-11: 13:00:00 via INTRAVENOUS

## 2016-08-11 SURGICAL SUPPLY — 19 items
BAG URO CATCHER STRL LF (MISCELLANEOUS) ×3 IMPLANT
BASKET ZERO TIP NITINOL 2.4FR (BASKET) IMPLANT
CATH INTERMIT  6FR 70CM (CATHETERS) ×3 IMPLANT
CATH URET 5FR 28IN OPEN ENDED (CATHETERS) ×3 IMPLANT
CLOTH BEACON ORANGE TIMEOUT ST (SAFETY) ×3 IMPLANT
COVER SURGICAL LIGHT HANDLE (MISCELLANEOUS) IMPLANT
GLOVE BIOGEL M STRL SZ7.5 (GLOVE) ×3 IMPLANT
GOWN STRL REUS W/TWL LRG LVL3 (GOWN DISPOSABLE) ×3 IMPLANT
GOWN STRL REUS W/TWL XL LVL3 (GOWN DISPOSABLE) ×3 IMPLANT
GUIDEWIRE ANG ZIPWIRE 038X150 (WIRE) ×3 IMPLANT
GUIDEWIRE STR DUAL SENSOR (WIRE) ×3 IMPLANT
MANIFOLD NEPTUNE II (INSTRUMENTS) ×3 IMPLANT
NS IRRIG 1000ML POUR BTL (IV SOLUTION) ×3 IMPLANT
PACK CYSTO (CUSTOM PROCEDURE TRAY) ×3 IMPLANT
STENT URET 6FRX26 CONTOUR (STENTS) ×3 IMPLANT
SYR CONTROL 10ML LL (SYRINGE) ×3 IMPLANT
TRAY FOLEY W/METER SILVER 16FR (SET/KITS/TRAYS/PACK) ×3 IMPLANT
TUBING CONNECTING 10 (TUBING) ×2 IMPLANT
TUBING CONNECTING 10' (TUBING) ×1

## 2016-08-11 NOTE — Anesthesia Preprocedure Evaluation (Signed)
Anesthesia Evaluation    Airway Mallampati: II       Dental  (+) Edentulous Upper, Edentulous Lower   Pulmonary former smoker,    Pulmonary exam normal breath sounds clear to auscultation       Cardiovascular hypertension, Normal cardiovascular exam+ dysrhythmias Atrial Fibrillation  Rhythm:Regular Rate:Normal     Neuro/Psych    GI/Hepatic GERD  Medicated and Controlled,  Endo/Other    Renal/GU Renal InsufficiencyRenal diseasenegative Renal ROS     Musculoskeletal   Abdominal Normal abdominal exam  (+)   Peds  Hematology  (+) Blood dyscrasia, anemia ,   Anesthesia Other Findings Study Result   Result status: Final result [employer] [facility]*            [address]             [place], [place] [address]               [phone]  -------------------------------------------------------------------- Echocardiography  Patient:  [name], [name] MR #:    [id number] Study Date: 07/24/2008 Gender:   F Age:    81 Height:   165.1cm Weight:   54kg BSA:    1.57m^2 Pt. Status: Room:  SONOGRAPHER Charlann Noss, RDCS ORDERING   Kirk Ruths, MD, [place], MD, [place]  [place], Office cc:  -------------------------------------------------------------------- Indications:  Atrial fibrillation - 427.31.  -------------------------------------------------------------------- History: PMH: Syncope. Chronic obstructive pulmonary disease. Risk factors: Former tobacco use. Hypertension.  -------------------------------------------------------------------- Study Conclusions Left ventricle: The cavity size was normal. Wall thickness was normal. Systolic function was normal. The estimated ejection fraction was in the range of 55% to 60%.  Doppler parameters are consistent with abnormal left ventricular relaxation (grade 1 diastolic dysfunction).  Echocardiography. M-mode, complete 2D, spectral Doppler, and color Doppler. Height: Height: 165.1cm. Height: 65in. Weight: Weight: 54kg. Weight: 118.8lb. Body mass index: BMI: 19.8kg/m^2. Body surface area: BSA: 1.67m^2. Patient status: Outpatient. Location: [facility]     Reproductive/Obstetrics                             Anesthesia Physical Anesthesia Plan  ASA: II  Anesthesia Plan: General   Post-op Pain Management:    Induction: Intravenous  PONV Risk Score and Plan: 4 or greater and Ondansetron, Dexamethasone, Propofol, Midazolam, Scopolamine patch - Pre-op and Treatment may vary due to age  Airway Management Planned: LMA  Additional Equipment:   Intra-op Plan:   Post-operative Plan:   Informed Consent: I have reviewed the patients History and Physical, chart, labs and discussed the procedure including the risks, benefits and alternatives for the proposed anesthesia with the patient or authorized representative who has indicated his/her understanding and acceptance.     Plan Discussed with: CRNA and Surgeon  Anesthesia Plan Comments:         Anesthesia Quick Evaluation

## 2016-08-11 NOTE — ED Notes (Signed)
Pt continues to c/o pain and nausea that has not been relieved since arrival. Dr Tyrell Antonio sent text for med request

## 2016-08-11 NOTE — Progress Notes (Signed)
PROGRESS NOTE    [name]  KDX:[id number] DOB: 1935-11-01 DOA: 08/10/2016 PCP: Cassandria Anger, MD    Brief Narrative: [name] is a 81 y.o. female with history of atrial fibrillation, COPD presents to the ER with complaints of persistent nausea vomiting and right flank pain for the last 3 days. Denies any diarrhea fever or chills.   ED Course: In the ER CT scan of the abdomen shows right-sided ureter stone with hydronephrosis. Labs revealed acute renal failure. UA is not showing any features of UTI. Patient is afebrile. Since patient desiring persistent vomiting and pain patient will be admitted for further pain control and hydration. On call urologist Dr. Noah Delaine was consulted.    Assessment & Plan:   Principal Problem:   Intractable nausea and vomiting Active Problems:   Essential hypertension   Atrial fibrillation (HCC)   EMPHYSEMA   ARF (acute renal failure) (HCC)   Urolithiasis   Abdominal pain  1-Right side hydronephrosis Proximal 4 x 6 mm stone in the right ureter;  Continue with IV fluids.  IV tylenol.  PRN nausea medications.  Urology consulted.  CT renal protocol; Moderate to marked right hydronephrosis and proximal hydroureter, secondary to a 4 x 6 mm stone in the proximal right ureter several cm past the right UPJ. UA negative for infection. Check urine culture.   2-Acute Renal failure; obstructive uropathy.  Urology consulted.  Continue with IV fluids.   HTN; PRN hydralazine.   Persistent A. Fib - Continue flecainide and metoprolol. COPD ; stable.   DVT prophylaxis: scd Code Status: full code.  Family Communication: care discussed with patient.  Disposition Plan: home at time of discharge   Consultants:   Urology    Procedures: none   Antimicrobials: none   Subjective: Complaining of nausea, vomiting.  Flank pain.   Objective: Vitals:   08/11/16 0500 08/11/16 0644 08/11/16 0800 08/11/16 0900  BP: (!) 173/62 (!) 186/67  (!) 187/64 (!) 178/52  Pulse: 72 74 73 75  Resp:  18  15  Temp:      TempSrc:      SpO2: 100% 94% 96% 96%  Height:       No intake or output data in the 24 hours ending 08/11/16 0909 There were no vitals filed for this visit.  Examination:  General exam: Appears in pain  Respiratory system: Clear to auscultation. Respiratory effort normal. Cardiovascular system: S1 & S2 heard, RRR. No JVD, murmurs, rubs, gallops or clicks. No pedal edema. Gastrointestinal system: Abdomen is nondistended, soft and nontender. No organomegaly or masses felt. Normal bowel sounds heard. Central nervous system: Alert and oriented. No focal neurological deficits. Extremities: Symmetric 5 x 5 power. Skin: No rashes, lesions or ulcers     Data Reviewed: I have personally reviewed following labs and imaging studies  CBC:  Recent Labs Lab 08/10/16 1551 08/10/16 2025 08/11/16 0800  WBC 11.9* 12.3* 14.1*  NEUTROABS  --  8.6* 11.2*  HGB 10.9* 12.5 10.4*  HCT 33.8* 38.9 32.4*  MCV 87.9 88.0 89.0  PLT  --  319 976   Basic Metabolic Panel:  Recent Labs Lab 08/10/16 1532 08/10/16 2025  NA 145* 142  K 5.2 4.6  CL 107* 109  CO2 23 23  GLUCOSE 75 66  BUN 26 26*  CREATININE 2.02* 2.06*  CALCIUM 9.5 9.4   GFR: Estimated Creatinine Clearance: 18.2 mL/min (A) (by C-G formula based on SCr of 2.06 mg/dL (H)). Liver Function Tests:  Recent  Labs Lab 08/10/16 1532 08/10/16 2025  AST 35 37  ALT 17 17  ALKPHOS 86 82  BILITOT 0.2 0.6  PROT 6.4 7.2  ALBUMIN 4.0 3.9    Recent Labs Lab 08/10/16 2025  LIPASE 34   No results for input(s): AMMONIA in the last 168 hours. Coagulation Profile: No results for input(s): INR, PROTIME in the last 168 hours. Cardiac Enzymes: No results for input(s): CKTOTAL, CKMB, CKMBINDEX, TROPONINI in the last 168 hours. BNP (last 3 results) No results for input(s): PROBNP in the last 8760 hours. HbA1C: No results for input(s): HGBA1C in the last 72  hours. CBG: No results for input(s): GLUCAP in the last 168 hours. Lipid Profile: No results for input(s): CHOL, HDL, LDLCALC, TRIG, CHOLHDL, LDLDIRECT in the last 72 hours. Thyroid Function Tests: No results for input(s): TSH, T4TOTAL, FREET4, T3FREE, THYROIDAB in the last 72 hours. Anemia Panel: No results for input(s): VITAMINB12, FOLATE, FERRITIN, TIBC, IRON, RETICCTPCT in the last 72 hours. Sepsis Labs: No results for input(s): PROCALCITON, LATICACIDVEN in the last 168 hours.  No results found for this or any previous visit (from the past 240 hour(s)).       Radiology Studies: Dg Abd Acute W/chest  Result Date: 08/10/2016 CLINICAL DATA:  Abdominal pain EXAM: DG ABDOMEN ACUTE W/ 1V CHEST COMPARISON:  Chest radiograph April 03, 2016 FINDINGS: PA chest: There is stable apical pleural thickening bilaterally. There is no edema or consolidation. Heart is borderline enlarged with pulmonary vascularity within normal limits. There is extensive aortic atherosclerotic calcification. No adenopathy. Supine and upright abdomen: There is no bowel dilatation or air-fluid level to suggest bowel obstruction. No free air. There are surgical clips in the left upper quadrant. There is aortoiliac atherosclerosis. There is thoracolumbar dextroscoliosis. IMPRESSION: No bowel obstruction or free air. Surgical clips left upper quadrant. Apical pleural thickening bilaterally, stable. No edema or consolidation. Stable cardiac silhouette. There is aortoiliac atherosclerosis. Electronically Signed   By: Lowella Grip III M.D.   On: 08/10/2016 16:09   Ct Renal Stone Study  Result Date: 08/10/2016 CLINICAL DATA:  Right sided abdominal pain with nausea and vomiting for 2 days EXAM: CT ABDOMEN AND PELVIS WITHOUT CONTRAST TECHNIQUE: Multidetector CT imaging of the abdomen and pelvis was performed following the standard protocol without IV contrast. COMPARISON:  08/10/2016 radiograph, CT 01/30/2005 FINDINGS: Lower  chest: Mild emphysema at the lung bases with scarring at the lingula and left base. No acute consolidation or effusion. There is moderate cardiomegaly. Densely calcified distal thoracic aorta. Hepatobiliary: No focal liver abnormality is seen. No gallstones, gallbladder wall thickening, or biliary dilatation. Pancreas: Unremarkable. No pancreatic ductal dilatation or surrounding inflammatory changes. Spleen: Status post splenectomy. Probable splenule in the left upper quadrant. Adjacent dystrophic calcification. Adrenals/Urinary Tract: Adrenal glands are within normal limits. 2 mm stone upper pole left kidney. Enlarged right kidney with moderate hydronephrosis and proximal hydroureter. This is secondary a 4 mm AP x 6 mm craniocaudad stone in the proximal right ureter, several cm past the right UPJ. Urinary bladder is empty Stomach/Bowel: Stomach nonenlarged. No dilated small bowel. No colon wall thickening. Colon diverticular disease without acute inflammation. Suspect prior appendectomy. Vascular/Lymphatic: Densely calcified aorta and branch vessels. No aneurysmal dilatation. No significantly enlarged lymph nodes. Reproductive: Status post hysterectomy. No adnexal masses. Other: Small free fluid in the pelvis.  No free air. Musculoskeletal: Heterogenous attenuation of the vertebral bodies with mixed sclerotic and lucent lesions. No acute osseous abnormality. IMPRESSION: 1. Moderate to marked right hydronephrosis and  proximal hydroureter, secondary to a 4 x 6 mm stone in the proximal right ureter several cm past the right UPJ. 2. Small amount of free fluid in the pelvis 3. Extensive atherosclerotic calcific disease of the aorta 4. Cardiomegaly 5. Status post splenectomy with probable splenule in the left upper quadrant 6. Heterogeneous attenuation of the vertebral bodies, could relate to osteopenia although marrow infiltrative process is also a consideration. Suggest correlation with nonemergent MRI. Electronically  Signed   By: Donavan Foil M.D.   On: 08/10/2016 19:43        Scheduled Meds: . Cholecalciferol  1,000 Units Oral Daily  . clonazePAM  0.5 mg Oral QHS  . cyanocobalamin  500 mcg Oral Daily  . flecainide  50 mg Oral BID  . lactose free nutrition  237 mL Oral Q breakfast  . metoprolol tartrate  12.5 mg Oral BID  . pantoprazole  40 mg Oral Daily  . umeclidinium-vilanterol  1 puff Inhalation Daily   Continuous Infusions: . acetaminophen    . dextrose 5 % and 0.9% NaCl       LOS: 0 days    Time spent: 35 minutes .     Elmarie Shiley, MD [facility] Pager [phone]  If 7PM-7AM, please contact night-coverage [email or web address] Password [email or web address] 08/11/2016, 9:09 AM

## 2016-08-11 NOTE — ED Notes (Signed)
Pt dry heaves and nausea increased with IV Zofran. Dr Tyrell Antonio notified

## 2016-08-11 NOTE — Progress Notes (Signed)
  She continues to have severe right flank pain "all the way to my bladder". She hasn't seen a stone pass. She continues to have nausea and emesis.   She looks in pain Writhing around on bed Retching in bed    I discussed patient with Dr. Alyson Ingles and reviewed the chart and labs. Discussed with the patient the nature, potential benefits, risks and alternatives to cystoscopy, right RGP/stent possible URS/HLL, including side effects of the proposed treatment, the likelihood of the patient achieving the goals of the procedure, and any potential problems that might occur during the procedure or recuperation. We discussed rationale for staged procedure if stone proximal and difficult to access. All questions answered. Patient elects to proceed. Also discussed it's possible she passed the stone and discussed repeat imaging, however a negative KUB won't be very helpful. Also, she has had no rash or trouble breathing after amoxicillin or penicillin. We discussed possible cross reaction with cefazolin and she consented to this antibiotic.  We also discussed stent pain but hopefully this will help her kidney function and her nausea.  She elects to proceed to OR for above.

## 2016-08-11 NOTE — ED Notes (Signed)
Hospitalist at bedside 

## 2016-08-11 NOTE — H&P (Signed)
History and Physical    [name] FMB:[id number] DOB: Jul 11, 1935 DOA: 08/10/2016  PCP: Cassandria Anger, MD  Patient coming from: Home.  Chief Complaint: Right flank pain with nausea vomiting.  HPI: [name] is a 81 y.o. female with history of atrial fibrillation, COPD presents to the ER with complaints of persistent nausea vomiting and right flank pain for the last 3 days. Denies any diarrhea fever or chills.   ED Course: In the ER CT scan of the abdomen shows right-sided ureter stone with hydronephrosis. Labs revealed acute renal failure. UA is not showing any features of UTI. Patient is afebrile. Since patient desiring persistent vomiting and pain patient will be admitted for further pain control and hydration. On call urologist Dr. Noah Delaine was consulted.  Review of Systems: As per HPI, rest all negative.   Past Medical History:  Diagnosis Date  . Anxiety   . Arthritis   . Atrial fibrillation ([place])   . B12 deficiency   . COPD (chronic obstructive pulmonary disease) ([place])   . Depression   . GERD (gastroesophageal reflux disease)   . Hypertension   . Osteoarthrosis, hand    both hands  . Peptic ulcer, unspecified site, unspecified as acute or chronic, without mention of hemorrhage, perforation, or obstruction   . Pneumonia   . Vitamin D deficiency disease     Past Surgical History:  Procedure Laterality Date  . BLADDER SURGERY     Bladder tack and intestines  . CYSTOCELE REPAIR    . SPLENECTOMY       reports that she has quit smoking. She has never used smokeless tobacco. She reports that she does not drink alcohol or use drugs.  Allergies  Allergen Reactions  . Amoxicillin     "makes me crazy"  . Codeine     REACTION: Nausea  . Levaquin [Levofloxacin In D5w]     n/v  . Other     Pt does not want to take any pain meds    Family History  Problem Relation Age of Onset  . Heart disease Father   . Arthritis Unknown        Family history of  .  Coronary artery disease Unknown        Family history of  . Hypertension Unknown        Family history of  . Diabetes Daughter        45 died    Prior to Admission medications   Medication Sig Start Date End Date Taking? Authorizing Provider  aspirin 81 MG chewable tablet Chew 81 mg by mouth 2 (two) times daily.    Yes [provider]  Cholecalciferol ([employer] VITAMIN D3) 1000 UNITS capsule Take 1,000 Units by mouth daily.     Yes [provider]  clonazePAM (KLONOPIN) 0.5 MG tablet TAKE 1 OR 2 TABLETS AT BEDTIME 08/06/16  Yes Plotnikov, Evie Lacks, MD  cyanocobalamin (V-R VITAMIN B-12) 500 MCG tablet Take 1 tablet (500 mcg total) by mouth daily. 09/17/15  Yes Plotnikov, Evie Lacks, MD  flecainide (TAMBOCOR) 50 MG tablet TAKE 1 TABLET BY MOUTH TWICE DAILY 01/10/16  Yes Crenshaw, Denice Bors, MD  lactose free nutrition (BOOST) LIQD Take 237 mLs by mouth daily with breakfast.   Yes [provider]  LORazepam (ATIVAN) 1 MG tablet TAKE 1 OR 2 TABLETS AT BEDTIME AS NEEDED FOR SLEEP OR ANXIETY 08/06/16  Yes Plotnikov, Evie Lacks, MD  metoprolol tartrate (LOPRESSOR) 25 MG tablet Take  0.5 tablets (12.5 mg total) by mouth 2 (two) times daily. 03/08/16  Yes Plotnikov, Evie Lacks, MD  omeprazole (PRILOSEC) 20 MG capsule Take 20 mg by mouth daily.   Yes [provider]  umeclidinium-vilanterol (ANORO ELLIPTA) 62.5-25 MCG/INH AEPB Inhale 1 puff into the lungs daily. 04/03/16  Yes Plotnikov, Evie Lacks, MD    Physical Exam: Vitals:   08/10/16 2130 08/10/16 2145 08/10/16 2200 08/10/16 2300  BP: (!) 190/60 (!) 177/56 (!) 157/69 (!) 158/55  Pulse: 69 66 65 71  Resp: 18     Temp:      TempSrc:      SpO2: 96% 96% 97% (!) 86%  Height:          Constitutional: Moderately built and nourished. Vitals:   08/10/16 2130 08/10/16 2145 08/10/16 2200 08/10/16 2300  BP: (!) 190/60 (!) 177/56 (!) 157/69 (!) 158/55  Pulse: 69 66 65 71  Resp: 18     Temp:      TempSrc:      SpO2: 96% 96% 97%  (!) 86%  Height:       Eyes: Anicteric no pallor. ENMT: No discharge from the ears eyes nose and mouth. Neck: No mass felt. No neck rigidity. Respiratory: No rhonchi or crepitations. Cardiovascular: S1 and S2 heard no murmurs appreciated. Abdomen: Mild right-sided tenderness. No guarding or rigidity. Bowel sounds present. Musculoskeletal: No edema. No joint effusion. Skin: No rash. Skin appears warm. Neurologic: Alert awake oriented to time place and person. Moves all extremities. Psychiatric: Appears normal. Normal affect.   Labs on Admission: I have personally reviewed following labs and imaging studies  CBC:  Recent Labs Lab 08/10/16 1551 08/10/16 2025  WBC 11.9* 12.3*  NEUTROABS  --  8.6*  HGB 10.9* 12.5  HCT 33.8* 38.9  MCV 87.9 88.0  PLT  --  678   Basic Metabolic Panel:  Recent Labs Lab 08/10/16 2025  NA 142  K 4.6  CL 109  CO2 23  GLUCOSE 66  BUN 26*  CREATININE 2.06*  CALCIUM 9.4   GFR: Estimated Creatinine Clearance: 18.2 mL/min (A) (by C-G formula based on SCr of 2.06 mg/dL (H)). Liver Function Tests:  Recent Labs Lab 08/10/16 2025  AST 37  ALT 17  ALKPHOS 82  BILITOT 0.6  PROT 7.2  ALBUMIN 3.9    Recent Labs Lab 08/10/16 2025  LIPASE 34   No results for input(s): AMMONIA in the last 168 hours. Coagulation Profile: No results for input(s): INR, PROTIME in the last 168 hours. Cardiac Enzymes: No results for input(s): CKTOTAL, CKMB, CKMBINDEX, TROPONINI in the last 168 hours. BNP (last 3 results) No results for input(s): PROBNP in the last 8760 hours. HbA1C: No results for input(s): HGBA1C in the last 72 hours. CBG: No results for input(s): GLUCAP in the last 168 hours. Lipid Profile: No results for input(s): CHOL, HDL, LDLCALC, TRIG, CHOLHDL, LDLDIRECT in the last 72 hours. Thyroid Function Tests: No results for input(s): TSH, T4TOTAL, FREET4, T3FREE, THYROIDAB in the last 72 hours. Anemia Panel: No results for input(s):  VITAMINB12, FOLATE, FERRITIN, TIBC, IRON, RETICCTPCT in the last 72 hours. Urine analysis:    Component Value Date/Time   COLORURINE YELLOW 08/10/2016 1915   APPEARANCEUR CLEAR 08/10/2016 1915   LABSPEC 1.019 08/10/2016 1915   PHURINE 5.0 08/10/2016 1915   GLUCOSEU NEGATIVE 08/10/2016 1915   GLUCOSEU NEGATIVE 03/20/2014 [place] 08/10/2016 [place] NEGATIVE 08/10/2016 1915   BILIRUBINUR negative 08/10/2016 1538  KETONESUR 5 (A) 08/10/2016 1915   PROTEINUR NEGATIVE 08/10/2016 1915   UROBILINOGEN 0.2 08/10/2016 1538   UROBILINOGEN 0.2 03/20/2014 1144   NITRITE NEGATIVE 08/10/2016 1915   LEUKOCYTESUR NEGATIVE 08/10/2016 1915   Sepsis Labs: @LABRCNTIP (procalcitonin:4,lacticidven:4) )No results found for this or any previous visit (from the past 240 hour(s)).   Radiological Exams on Admission: Dg Abd Acute W/chest  Result Date: 08/10/2016 CLINICAL DATA:  Abdominal pain EXAM: DG ABDOMEN ACUTE W/ 1V CHEST COMPARISON:  Chest radiograph April 03, 2016 FINDINGS: PA chest: There is stable apical pleural thickening bilaterally. There is no edema or consolidation. Heart is borderline enlarged with pulmonary vascularity within normal limits. There is extensive aortic atherosclerotic calcification. No adenopathy. Supine and upright abdomen: There is no bowel dilatation or air-fluid level to suggest bowel obstruction. No free air. There are surgical clips in the left upper quadrant. There is aortoiliac atherosclerosis. There is thoracolumbar dextroscoliosis. IMPRESSION: No bowel obstruction or free air. Surgical clips left upper quadrant. Apical pleural thickening bilaterally, stable. No edema or consolidation. Stable cardiac silhouette. There is aortoiliac atherosclerosis. Electronically Signed   By: Lowella Grip III M.D.   On: 08/10/2016 16:09   Ct Renal Stone Study  Result Date: 08/10/2016 CLINICAL DATA:  Right sided abdominal pain with nausea and vomiting for 2 days  EXAM: CT ABDOMEN AND PELVIS WITHOUT CONTRAST TECHNIQUE: Multidetector CT imaging of the abdomen and pelvis was performed following the standard protocol without IV contrast. COMPARISON:  08/10/2016 radiograph, CT 01/30/2005 FINDINGS: Lower chest: Mild emphysema at the lung bases with scarring at the lingula and left base. No acute consolidation or effusion. There is moderate cardiomegaly. Densely calcified distal thoracic aorta. Hepatobiliary: No focal liver abnormality is seen. No gallstones, gallbladder wall thickening, or biliary dilatation. Pancreas: Unremarkable. No pancreatic ductal dilatation or surrounding inflammatory changes. Spleen: Status post splenectomy. Probable splenule in the left upper quadrant. Adjacent dystrophic calcification. Adrenals/Urinary Tract: Adrenal glands are within normal limits. 2 mm stone upper pole left kidney. Enlarged right kidney with moderate hydronephrosis and proximal hydroureter. This is secondary a 4 mm AP x 6 mm craniocaudad stone in the proximal right ureter, several cm past the right UPJ. Urinary bladder is empty Stomach/Bowel: Stomach nonenlarged. No dilated small bowel. No colon wall thickening. Colon diverticular disease without acute inflammation. Suspect prior appendectomy. Vascular/Lymphatic: Densely calcified aorta and branch vessels. No aneurysmal dilatation. No significantly enlarged lymph nodes. Reproductive: Status post hysterectomy. No adnexal masses. Other: Small free fluid in the pelvis.  No free air. Musculoskeletal: Heterogenous attenuation of the vertebral bodies with mixed sclerotic and lucent lesions. No acute osseous abnormality. IMPRESSION: 1. Moderate to marked right hydronephrosis and proximal hydroureter, secondary to a 4 x 6 mm stone in the proximal right ureter several cm past the right UPJ. 2. Small amount of free fluid in the pelvis 3. Extensive atherosclerotic calcific disease of the aorta 4. Cardiomegaly 5. Status post splenectomy with  probable splenule in the left upper quadrant 6. Heterogeneous attenuation of the vertebral bodies, could relate to osteopenia although marrow infiltrative process is also a consideration. Suggest correlation with nonemergent MRI. Electronically Signed   By: Donavan Foil M.D.   On: 08/10/2016 19:43     Assessment/Plan Principal Problem:   Intractable nausea and vomiting Active Problems:   Essential hypertension   Atrial fibrillation ([facility])   EMPHYSEMA   ARF (acute renal failure) ([facility])   Urolithiasis   Abdominal pain    1. Intractable nausea vomiting with right-sided hydronephrosis with ureteral stone -  Dr. Alyson Ingles on call urologist has been consulted. Patient will be admitted for pain control and further hydration. Patient is not febrile and UA does not show any signs of infection. Further recommendations per urologist. 2. Acute renal failure probably from persistent nausea vomiting - hydrate and recheck metabolic panel. 3. Uncontrolled hypertension - probably secondary to pain and discomfort. Will keep patient on when necessary IV hydralazine along with metoprolol. 4. Persistent A. Fib - chads 2 vasc score is more than 2. Patient had declined anticoagulation previously per cardiology notes. Patient is on aspirin which will be held in anticipation of procedure. Continue flecainide and metoprolol. 5. COPD not actively wheezing.   DVT prophylaxis: SCDs. Code Status: Full code.  Family Communication: Discussed with patient.  Disposition Plan: Home.  Consults called: Urology.  Admission status: Observation.    Rise Patience MD [facility] Pager [phone].  If 7PM-7AM, please contact night-coverage [email or web address] Password [email or web address]  08/11/2016, 12:01 AM

## 2016-08-11 NOTE — ED Notes (Signed)
Pt sleeping quietly at this time. Pt sts she is finally feeling better

## 2016-08-11 NOTE — Op Note (Signed)
Preoperative diagnosis: Right proximal ureteral stone, nausea, vomiting, flank pain Postoperative diagnosis: Right ureteral stricture, right ureteral stone, nausea, vomiting, flank pain  Procedure: Cystoscopy, right retrograde pyelogram, right ureteroscopy diagnostic, right ureteral stent placement  Surgeon: Junious Silk  Anesthesia: Gen.  Indication for procedure: 81 year old with 4 mm right proximal stone which she was given trial of stone passage even after admission but continued to have retching nausea vomiting and flank pain. She also had acute kidney injury. She was brought for ureteral stent.   Findings: On exam under anesthesia there is atrophic vaginitis but she appeared to have a posterior repair and had a normal introitus. The meatus appeared normal. On cystoscopy there was no stone or foreign body in the bladder. The trigone and ureteral orifices appeared normal. I notice clear efflux from the left ureteral orifice and none from the right.  Right retrograde pyelogram-this outlined a single ureter single collecting system unit. There was a transition point in the mid to proximal ureter where the ureter was narrow and took a turn and then dilation proximal to this. I suspect this is where the stone is but there wasn't a filling defect per se but this was the location of the stone on the CT. After ureteroscopy on follow-up retrograde the collecting system again was outlined as well as the renal pelvis and proximal ureter and all of this was normal and intact.  Right ureteroscopy-there was a ureterovesical junction dense stricture. The remainder of the distal ureter appeared normal and intact.  Description of procedure: After consent was obtained patient was brought to the operating room. After adequate anesthesia she was placed in lithotomy and prepped and draped in the usual sterile fashion. A timeout was performed to confirm the patient and procedure. The cystoscope was passed per urethra  and the right ureteral orifice was not easy to cannulate with a 6 [place] open-ended catheter nor a cone-tipped. I found it odd it was difficult to get contrast proximally. I was finally able to with the cone tip and I didn't appreciate the stricture at the UVJ because proximally the ureter appeared normal apart from the narrowing and turn at the mid to proximal ureter and the proximal dilation. I then repassed the 6 [place] open-ended catheter and guided a sensor wire up into the elective system.  Here it became difficult as I could not advance the 6 [place] open-ended catheter. Therefore I tried a 5 [place]. Again this would not advance. Therefore I looked and with a semirigid ureteroscope and the UVJ appeared tight around the sensor wire. I could not get a Glidewire to go adjacent to the sensor so I removed the sensor for now under direct vision I could see there was a UVJ stricture. I advanced a Glidewire and then a 4 [place] glide catheter and was able to get up into the collecting system. A quick retrograde confirm placement of the catheter in the lumen. I then tried again with the 5 and 6 [place] open-ended catheters and could get no purchase. It simply coiled in the bladder and pulled out the Glidewire. I replaced the Glidewire under direct vision and while waiting for a balloon advanced and no as of the semirigid ureteroscope and could tell it was dilating the UVJ and it popped through the stricture and popped a ring of tissue off onto the Glidewire. I then left the Glidewire in place and went beside it was able to visualize the distal ureter and noted it to be normal and intact. Under  direct vision I passed a sensor wire and remove the Glidewire over the sensor I was now able to get the 6 [place] into the collecting system remove the wire and there was a good hydronephrotic drip. I reconfirmed access with a retrograde and then replaced the wire. I then backloaded on the cystoscope and placed a 6 x 26 cm  ureteral stent. There was a good coil seen in the renal pelvis and a good coil in the bladder. I left a Foley for drainage. She was awakened taken to recovery room stable condition.  Complications: None  Blood loss: Minimal  Specimens: None  Drains: 6 x 26 and a meter right ureteral stent  Disposition: Patient stable to PACU

## 2016-08-11 NOTE — Discharge Instructions (Signed)
Ureteral Stent Implantation, Care After Refer to this sheet in the next few weeks. These instructions provide you with information about caring for yourself after your procedure. Your health care provider may also give you more specific instructions. Your treatment has been planned according to current medical practices, but problems sometimes occur. Call your health care provider if you have any problems or questions after your procedure.  URETERAL STENT REMOVAL  - be certain to schedule another camera/scope look into the ureter with Dr. Alyson Ingles to get the stone out in 1-2 weeks.   What can I expect after the procedure? After the procedure, it is common to have:  Nausea.  Mild pain when you urinate. You may feel this pain in your lower back or lower abdomen. Pain should stop within a few minutes after you urinate. This may last for up to 1 week.  A small amount of blood in your urine for several days.  Follow these instructions at home:  Medicines  Take over-the-counter and prescription medicines only as told by your health care provider.  If you were prescribed an antibiotic medicine, take it as told by your health care provider. Do not stop taking the antibiotic even if you start to feel better.  Do not drive for 24 hours if you received a sedative.  Do not drive or operate heavy machinery while taking prescription pain medicines. Activity  Return to your normal activities as told by your health care provider. Ask your health care provider what activities are safe for you.  Do not lift anything that is heavier than 10 lb (4.5 kg). Follow this limit for 1 week after your procedure, or for as long as told by your health care provider. General instructions  Watch for any blood in your urine. Call your health care provider if the amount of blood in your urine increases.  If you have a catheter: ? Follow instructions from your health care provider about taking care of your catheter  and collection bag. ? Do not take baths, swim, or use a hot tub until your health care provider approves.  Drink enough fluid to keep your urine clear or pale yellow.  Keep all follow-up visits as told by your health care provider. This is important. Contact a health care provider if:  You have pain that gets worse or does not get better with medicine, especially pain when you urinate.  You have difficulty urinating.  You feel nauseous or you vomit repeatedly during a period of more than 2 days after the procedure. Get help right away if:  Your urine is dark red or has blood clots in it.  You are leaking urine (have incontinence).  The end of the stent comes out of your urethra.  You cannot urinate.  You have sudden, sharp, or severe pain in your abdomen or lower back.  You have a fever.

## 2016-08-11 NOTE — ED Notes (Signed)
Pt was given crackers and a ginger ale

## 2016-08-11 NOTE — Anesthesia Procedure Notes (Addendum)
Procedure Name: LMA Insertion Date/Time: 08/11/2016 2:26 PM Performed by: Lyn Hollingshead Pre-anesthesia Checklist: Patient identified, Emergency Drugs available, Suction available, Patient being monitored and Timeout performed Patient Re-evaluated:Patient Re-evaluated prior to inductionOxygen Delivery Method: Circle system utilized Preoxygenation: Pre-oxygenation with 100% oxygen Intubation Type: IV induction LMA: LMA with gastric port inserted LMA Size: 3.0 Number of attempts: 1 Placement Confirmation: breath sounds checked- equal and bilateral Tube secured with: Tape Dental Injury: Teeth and Oropharynx as per pre-operative assessment

## 2016-08-11 NOTE — Anesthesia Postprocedure Evaluation (Signed)
Anesthesia Post Note  Patient: [name]  Procedure(s) Performed: Procedure(s) (LRB): CYSTOSCOPY, URETERSCOPY,  RIGHT RETROGRADE WITH RIGHT STENT PLACEMENT (Right)     Patient location during evaluation: PACU Anesthesia Type: General Level of consciousness: awake Pain management: pain level controlled Vital Signs Assessment: post-procedure vital signs reviewed and stable Respiratory status: spontaneous breathing Cardiovascular status: stable Postop Assessment: no signs of nausea or vomiting Anesthetic complications: no    Last Vitals:  Vitals:   08/11/16 1600 08/11/16 1615  BP: (!) 148/49   Pulse: 65 78  Resp: 10 13  Temp:      Last Pain:  Vitals:   08/11/16 1333  TempSrc:   PainSc: 2    Pain Goal: Patients Stated Pain Goal: 3 (08/11/16 1333)               Taiesha Bovard JR,JOHN Mateo Flow

## 2016-08-11 NOTE — Consult Note (Signed)
Urology Consult  Referring physician: Dr. Tyrell Antonio Reason for referral: rigth ureteral calculus, intractable nausea  Chief Complaint: right abdominal pain  History of Present Illness: [name] is a 81yo with a hx of afib, COPD who presented to the ER last night with a 3 day history of severe, constant, sharp, nonradiating right abdominal pain. This is her first stone event. She denies any LUTS. No exacerbating events. Pain is relieved with pain medication. SHe has associated nausea and vomiting which was controlled this morning with phenergan. She denies hematuria, dysuria or any other LUTS. CT scan shows a 71m proximal right ureteral calculus with moderate hydronephrosis. Creatinine is elevated at 2 but she has not drank fluids in over 2 days.    Past Medical History:  Diagnosis Date  . Anxiety   . Arthritis   . Atrial fibrillation ([place]   . B12 deficiency   . COPD (chronic obstructive pulmonary disease) ([facility]   . Depression   . GERD (gastroesophageal reflux disease)   . Hypertension   . Osteoarthrosis, hand    both hands  . Peptic ulcer, unspecified site, unspecified as acute or chronic, without mention of hemorrhage, perforation, or obstruction   . Pneumonia   . Vitamin D deficiency disease    Past Surgical History:  Procedure Laterality Date  . BLADDER SURGERY     Bladder tack and intestines  . CYSTOCELE REPAIR    . SPLENECTOMY      Medications: I have reviewed the patient's current medications. Allergies:  Allergies  Allergen Reactions  . Amoxicillin     "makes me crazy"  . Codeine     REACTION: Nausea  . Levaquin [Levofloxacin In D5w]     n/v  . Other     Pt does not want to take any pain meds    Family History  Problem Relation Age of Onset  . Heart disease Father   . Arthritis Unknown        Family history of  . Coronary artery disease Unknown        Family history of  . Hypertension Unknown        Family history of  . Diabetes Daughter        [id number]    Social History:  reports that she has quit smoking. She has never used smokeless tobacco. She reports that she does not drink alcohol or use drugs.  Review of Systems  Gastrointestinal: Positive for nausea and vomiting.  Genitourinary: Positive for flank pain.  Neurological: Positive for weakness.  All other systems reviewed and are negative.   Physical Exam:  Vital signs in last 24 hours: Temp:  [97.7 F (36.5 C)-98.6 F (37 C)] 97.7 F (36.5 C) (06/07 1715) Pulse Rate:  [65-76] 74 (06/08 1030) Resp:  [15-20] 15 (06/08 0900) BP: (150-190)/(52-122) 178/52 (06/08 0900) SpO2:  [85 %-100 %] 98 % (06/08 1030) Weight:  [51.3 kg (113 lb)] 51.3 kg (113 lb) (06/07 1502) Physical Exam  Constitutional: She is oriented to person, place, and time. She appears well-developed and well-nourished.  HENT:  Head: Normocephalic and atraumatic.  Eyes: EOM are normal. Pupils are equal, round, and reactive to light.  Neck: Normal range of motion. No thyromegaly present.  Cardiovascular: Normal rate and regular rhythm.   Respiratory: Effort normal. No respiratory distress.  GI: Soft. She exhibits no distension.  Musculoskeletal: Normal range of motion. She exhibits no edema.  Neurological: She is alert and oriented to person, place, and time.  Skin:  Skin is warm and dry.  Psychiatric: She has a normal mood and affect. Her behavior is normal. Judgment and thought content normal.    Laboratory Data:  Results for orders placed or performed during the hospital encounter of 08/10/16 (from the past 72 hour(s))  Urinalysis, Routine w reflex microscopic     Status: Abnormal   Collection Time: 08/10/16  7:15 PM  Result Value Ref Range   Color, Urine YELLOW YELLOW   APPearance CLEAR CLEAR   Specific Gravity, Urine 1.019 1.005 - 1.030   pH 5.0 5.0 - 8.0   Glucose, UA NEGATIVE NEGATIVE mg/dL   Hgb urine dipstick NEGATIVE NEGATIVE   Bilirubin Urine NEGATIVE NEGATIVE   Ketones, ur 5 (A) NEGATIVE mg/dL    Protein, ur NEGATIVE NEGATIVE mg/dL   Nitrite NEGATIVE NEGATIVE   Leukocytes, UA NEGATIVE NEGATIVE  CBC with Differential     Status: Abnormal   Collection Time: 08/10/16  8:25 PM  Result Value Ref Range   WBC 12.3 (H) 4.0 - 10.5 K/uL   RBC 4.42 3.87 - 5.11 MIL/uL   Hemoglobin 12.5 12.0 - 15.0 g/dL   HCT 38.9 36.0 - 46.0 %   MCV 88.0 78.0 - 100.0 fL   MCH 28.3 26.0 - 34.0 pg   MCHC 32.1 30.0 - 36.0 g/dL   RDW 16.4 (H) 11.5 - 15.5 %   Platelets 319 150 - 400 K/uL   Neutrophils Relative % 70 %   Neutro Abs 8.6 (H) 1.7 - 7.7 K/uL   Lymphocytes Relative 17 %   Lymphs Abs 2.1 0.7 - 4.0 K/uL   Monocytes Relative 12 %   Monocytes Absolute 1.5 (H) 0.1 - 1.0 K/uL   Eosinophils Relative 1 %   Eosinophils Absolute 0.1 0.0 - 0.7 K/uL   Basophils Relative 0 %   Basophils Absolute 0.0 0.0 - 0.1 K/uL  Comprehensive metabolic panel     Status: Abnormal   Collection Time: 08/10/16  8:25 PM  Result Value Ref Range   Sodium 142 135 - 145 mmol/L   Potassium 4.6 3.5 - 5.1 mmol/L   Chloride 109 101 - 111 mmol/L   CO2 23 22 - 32 mmol/L   Glucose, Bld 66 65 - 99 mg/dL   BUN 26 (H) 6 - 20 mg/dL   Creatinine, Ser 2.06 (H) 0.44 - 1.00 mg/dL   Calcium 9.4 8.9 - 10.3 mg/dL   Total Protein 7.2 6.5 - 8.1 g/dL   Albumin 3.9 3.5 - 5.0 g/dL   AST 37 15 - 41 U/L   ALT 17 14 - 54 U/L   Alkaline Phosphatase 82 38 - 126 U/L   Total Bilirubin 0.6 0.3 - 1.2 mg/dL   GFR calc non Af Amer 22 (L) >60 mL/min   GFR calc Af Amer 25 (L) >60 mL/min    Comment: (NOTE) The eGFR has been calculated using the CKD EPI equation. This calculation has not been validated in all clinical situations. eGFR's persistently <60 mL/min signify possible Chronic Kidney Disease.    Anion gap 10 5 - 15  Lipase, blood     Status: None   Collection Time: 08/10/16  8:25 PM  Result Value Ref Range   Lipase 34 11 - 51 U/L  CBC with Differential/Platelet     Status: Abnormal   Collection Time: 08/11/16  8:00 AM  Result Value Ref  Range   WBC 14.1 (H) 4.0 - 10.5 K/uL   RBC 3.64 (L) 3.87 - 5.11 MIL/uL  Hemoglobin 10.4 (L) 12.0 - 15.0 g/dL   HCT 32.4 (L) 36.0 - 46.0 %   MCV 89.0 78.0 - 100.0 fL   MCH 28.6 26.0 - 34.0 pg   MCHC 32.1 30.0 - 36.0 g/dL   RDW 16.6 (H) 11.5 - 15.5 %   Platelets 322 150 - 400 K/uL   Neutrophils Relative % 80 %   Neutro Abs 11.2 (H) 1.7 - 7.7 K/uL   Lymphocytes Relative 10 %   Lymphs Abs 1.4 0.7 - 4.0 K/uL   Monocytes Relative 10 %   Monocytes Absolute 1.4 (H) 0.1 - 1.0 K/uL   Eosinophils Relative 0 %   Eosinophils Absolute 0.0 0.0 - 0.7 K/uL   Basophils Relative 0 %   Basophils Absolute 0.0 0.0 - 0.1 K/uL  Comprehensive metabolic panel     Status: Abnormal   Collection Time: 08/11/16  8:00 AM  Result Value Ref Range   Sodium 142 135 - 145 mmol/L   Potassium 4.8 3.5 - 5.1 mmol/L   Chloride 110 101 - 111 mmol/L   CO2 21 (L) 22 - 32 mmol/L   Glucose, Bld 84 65 - 99 mg/dL   BUN 28 (H) 6 - 20 mg/dL   Creatinine, Ser 1.81 (H) 0.44 - 1.00 mg/dL   Calcium 8.6 (L) 8.9 - 10.3 mg/dL   Total Protein 6.2 (L) 6.5 - 8.1 g/dL   Albumin 3.4 (L) 3.5 - 5.0 g/dL   AST 36 15 - 41 U/L   ALT 14 14 - 54 U/L   Alkaline Phosphatase 72 38 - 126 U/L   Total Bilirubin 0.6 0.3 - 1.2 mg/dL   GFR calc non Af Amer 26 (L) >60 mL/min   GFR calc Af Amer 30 (L) >60 mL/min    Comment: (NOTE) The eGFR has been calculated using the CKD EPI equation. This calculation has not been validated in all clinical situations. eGFR's persistently <60 mL/min signify possible Chronic Kidney Disease.    Anion gap 11 5 - 15   No results found for this or any previous visit (from the past 240 hour(s)). Creatinine:  Recent Labs  08/10/16 1532 08/10/16 2025 08/11/16 0800  CREATININE 2.02* 2.06* 1.81*   Baseline Creatinine: 0.9  Impression/Assessment:  80yo with a right proximal ureteral calculus with intractable nausea  Plan:  1. I discussed the management of ureteral calculi <66m including medical expulsive  therapy, ureteral stent placement with delayed ureteroscopy, and ESWL. She wishes to proceed with ureteral stent placement with delayed treatment of her ureteral stone.   PNicolette Bang6/10/2016, 11:39 AM

## 2016-08-11 NOTE — Transfer of Care (Signed)
Immediate Anesthesia Transfer of Care Note  Patient: [name]  Procedure(s) Performed: Procedure(s): CYSTOSCOPY, URETERSCOPY,  RIGHT RETROGRADE WITH RIGHT STENT PLACEMENT (Right)  Patient Location: PACU  Anesthesia Type:General  Level of Consciousness:  sedated, patient cooperative and responds to stimulation  Airway & Oxygen Therapy:Patient Spontanous Breathing and Patient connected to face mask oxgen  Post-op Assessment:  Report given to PACU RN and Post -op Vital signs reviewed and stable  Post vital signs:  Reviewed and stable  Last Vitals:  Vitals:   08/11/16 1216 08/11/16 1322  BP: (!) 130/114 138/77  Pulse: 78   Resp: 15 20  Temp:      Complications: No apparent anesthesia complications

## 2016-08-11 NOTE — Care Management Obs Status (Signed)
[place] NOTIFICATION   Patient Details  Name: [name] MRN: [id number] Date of Birth: [birth year]   Medicare Observation Status Notification Given:  Ernesta Amble, RN 08/11/2016, 11:56 AM

## 2016-08-12 DIAGNOSIS — R112 Nausea with vomiting, unspecified: Secondary | ICD-10-CM | POA: Diagnosis not present

## 2016-08-12 DIAGNOSIS — J9601 Acute respiratory failure with hypoxia: Secondary | ICD-10-CM | POA: Diagnosis not present

## 2016-08-12 DIAGNOSIS — I480 Paroxysmal atrial fibrillation: Secondary | ICD-10-CM | POA: Diagnosis not present

## 2016-08-12 DIAGNOSIS — N179 Acute kidney failure, unspecified: Secondary | ICD-10-CM | POA: Diagnosis not present

## 2016-08-12 LAB — BASIC METABOLIC PANEL
Anion gap: 4 — ABNORMAL LOW (ref 5–15)
BUN: 18 mg/dL (ref 6–20)
CO2: 24 mmol/L (ref 22–32)
Calcium: 8.2 mg/dL — ABNORMAL LOW (ref 8.9–10.3)
Chloride: 114 mmol/L — ABNORMAL HIGH (ref 101–111)
Creatinine, Ser: 0.84 mg/dL (ref 0.44–1.00)
GFR calc Af Amer: >60 mL/min (ref >60)
GFR calc non Af Amer: >60 mL/min (ref >60)
Glucose, Bld: 157 mg/dL — ABNORMAL HIGH (ref 65–99)
Potassium: 4.1 mmol/L (ref 3.5–5.1)
Sodium: 142 mmol/L (ref 135–145)

## 2016-08-12 LAB — CBC
HCT: 30.5 % — ABNORMAL LOW (ref 36.0–46.0)
Hemoglobin: 9.5 g/dL — ABNORMAL LOW (ref 12.0–15.0)
MCH: 27.7 pg (ref 26.0–34.0)
MCHC: 31.1 g/dL (ref 30.0–36.0)
MCV: 88.9 fL (ref 78.0–100.0)
Platelets: 314 10*3/uL (ref 150–400)
RBC: 3.43 MIL/uL — ABNORMAL LOW (ref 3.87–5.11)
RDW: 16.9 % — ABNORMAL HIGH (ref 11.5–15.5)
WBC: 12.5 10*3/uL — ABNORMAL HIGH (ref 4.0–10.5)

## 2016-08-12 LAB — URINE CULTURE: Culture: NO GROWTH

## 2016-08-12 MED ORDER — ACETAMINOPHEN 325 MG PO TABS: 650.0000 mg | ORAL_TABLET | Freq: Four times a day (QID) | ORAL | 0 refills | Status: DC | PRN

## 2016-08-12 NOTE — Progress Notes (Addendum)
Writer went  to [employer] pt, pt stated " I'm seeing people in my room. I know that they are not here, but I see them and have been talking to them".   Writer notified MD. Discharge held. Family at bedside. Will continue to monitor.

## 2016-08-12 NOTE — Progress Notes (Signed)
1 Day Post-Op Subjective: Patient reports she's feeling much better. Voiding normally. Said she "talked to her sister" but her sister wasn't there. Foley out and she's voiding normally. Minimal stent pain.   Objective: Vital signs in last 24 hours: Temp:  [97.5 F (36.4 C)-98.2 F (36.8 C)] 97.5 F (36.4 C) (06/09 1415) Pulse Rate:  [64-80] 70 (06/09 1500) Resp:  [10-20] 18 (06/09 1415) BP: (142-181)/(34-72) 142/72 (06/09 1500) SpO2:  [90 %-97 %] 90 % (06/09 1415)  Intake/Output from previous day: 06/08 0701 - 06/09 0700 In: 3371.7 [P.O.:240; I.V.:3131.7] Out: 1005 [Urine:1000; Blood:5] Intake/Output this shift: Total I/O In: 800 [I.V.:800] Out: 450 [Urine:450]  Physical Exam:  NAD Alert and oriented Sitting in chair Watching TV   Lab Results:  Recent Labs  08/10/16 2025 08/11/16 0800 08/12/16 0719  HGB 12.5 10.4* 9.5*  HCT 38.9 32.4* 30.5*   BMET  Recent Labs  08/11/16 0800 08/12/16 0719  NA 142 142  K 4.8 4.1  CL 110 114*  CO2 21* 24  GLUCOSE 84 157*  BUN 28* 18  CREATININE 1.81* 0.84  CALCIUM 8.6* 8.2*   No results for input(s): LABPT, INR in the last 72 hours. No results for input(s): LABURIN in the last 72 hours. Results for orders placed or performed during the hospital encounter of 08/10/16  Urine Culture     Status: None   Collection Time: 08/10/16  7:15 PM  Result Value Ref Range Status   Specimen Description URINE, RANDOM  Final   Special Requests NONE  Final   Culture   Final    NO GROWTH Performed at [facility], [address], [place], [place] [address]    Report Status 08/12/2016 FINAL  Final    Studies/Results: Dg Abd 1 View  Result Date: 08/11/2016 CLINICAL DATA:  Cystoscopy and right ureteral stent placement for a right ureteral calculus with hydronephrosis. EXAM: ABDOMEN - 1 VIEW COMPARISON:  Abdomen and pelvis CT dated 08/10/2016. FINDINGS: Twelve C-arm views of the right pelvis and mid abdomen were obtained. These  demonstrate retrograde injection of contrast in the rib right ureter. The ureter is normal in caliber and there is mild to moderate dilatation of the right renal collecting system. There is an interval oval filling defect in the upper pole collecting system on the right, otherwise without visualization of the previously seen calculus in the right ureter. There is subsequent placement of a right ureteral stent in satisfactory position. IMPRESSION: 1. Satisfactory placement of a right ureteral stent. 2. The previously demonstrated right ureteral calculus has been displaced into the upper pole collecting system on the right. 3. Improved hydronephrosis on the right. Electronically Signed   By: Claudie Revering M.D.   On: 08/11/2016 15:50   Dg Abd Acute W/chest  Result Date: 08/10/2016 CLINICAL DATA:  Abdominal pain EXAM: DG ABDOMEN ACUTE W/ 1V CHEST COMPARISON:  Chest radiograph April 03, 2016 FINDINGS: PA chest: There is stable apical pleural thickening bilaterally. There is no edema or consolidation. Heart is borderline enlarged with pulmonary vascularity within normal limits. There is extensive aortic atherosclerotic calcification. No adenopathy. Supine and upright abdomen: There is no bowel dilatation or air-fluid level to suggest bowel obstruction. No free air. There are surgical clips in the left upper quadrant. There is aortoiliac atherosclerosis. There is thoracolumbar dextroscoliosis. IMPRESSION: No bowel obstruction or free air. Surgical clips left upper quadrant. Apical pleural thickening bilaterally, stable. No edema or consolidation. Stable cardiac silhouette. There is aortoiliac atherosclerosis. Electronically Signed  By: Lowella Grip III M.D.   On: 08/10/2016 16:09   Dg C-arm 1-60 Min-no Report  Result Date: 08/11/2016 Fluoroscopy was utilized by the requesting physician.  No radiographic interpretation.   Ct Renal Stone Study  Result Date: 08/10/2016 CLINICAL DATA:  Right sided abdominal  pain with nausea and vomiting for 2 days EXAM: CT ABDOMEN AND PELVIS WITHOUT CONTRAST TECHNIQUE: Multidetector CT imaging of the abdomen and pelvis was performed following the standard protocol without IV contrast. COMPARISON:  08/10/2016 radiograph, CT 01/30/2005 FINDINGS: Lower chest: Mild emphysema at the lung bases with scarring at the lingula and left base. No acute consolidation or effusion. There is moderate cardiomegaly. Densely calcified distal thoracic aorta. Hepatobiliary: No focal liver abnormality is seen. No gallstones, gallbladder wall thickening, or biliary dilatation. Pancreas: Unremarkable. No pancreatic ductal dilatation or surrounding inflammatory changes. Spleen: Status post splenectomy. Probable splenule in the left upper quadrant. Adjacent dystrophic calcification. Adrenals/Urinary Tract: Adrenal glands are within normal limits. 2 mm stone upper pole left kidney. Enlarged right kidney with moderate hydronephrosis and proximal hydroureter. This is secondary a 4 mm AP x 6 mm craniocaudad stone in the proximal right ureter, several cm past the right UPJ. Urinary bladder is empty Stomach/Bowel: Stomach nonenlarged. No dilated small bowel. No colon wall thickening. Colon diverticular disease without acute inflammation. Suspect prior appendectomy. Vascular/Lymphatic: Densely calcified aorta and branch vessels. No aneurysmal dilatation. No significantly enlarged lymph nodes. Reproductive: Status post hysterectomy. No adnexal masses. Other: Small free fluid in the pelvis.  No free air. Musculoskeletal: Heterogenous attenuation of the vertebral bodies with mixed sclerotic and lucent lesions. No acute osseous abnormality. IMPRESSION: 1. Moderate to marked right hydronephrosis and proximal hydroureter, secondary to a 4 x 6 mm stone in the proximal right ureter several cm past the right UPJ. 2. Small amount of free fluid in the pelvis 3. Extensive atherosclerotic calcific disease of the aorta 4.  Cardiomegaly 5. Status post splenectomy with probable splenule in the left upper quadrant 6. Heterogeneous attenuation of the vertebral bodies, could relate to osteopenia although marrow infiltrative process is also a consideration. Suggest correlation with nonemergent MRI. Electronically Signed   By: Donavan Foil M.D.   On: 08/10/2016 19:43    Assessment/Plan: POD#1 - right ureteral stent for right UVJ stricture and right proximal stone - much improved. Will sign off. Dr. Alyson Ingles will set up for URS. I discussed with patient again rationale for staged procedure, temporary nature and stents and importance of f/u.   Will sign off. Please call with any questions.    LOS: 0 days   Joannah Gitlin 08/12/2016, 3:21 PM

## 2016-08-12 NOTE — Progress Notes (Signed)
PROGRESS NOTE    [name]  ZLD:[id number] DOB: [birth year] DOA: 08/10/2016 PCP: Cassandria Anger, MD    Brief Narrative: [name] is a 81 y.o. female with history of atrial fibrillation, COPD presents to the ER with complaints of persistent nausea vomiting and right flank pain for the last 3 days. Denies any diarrhea fever or chills.   ED Course: In the ER CT scan of the abdomen shows right-sided ureter stone with hydronephrosis. Labs revealed acute renal failure. UA is not showing any features of UTI. Patient is afebrile. Since patient desiring persistent vomiting and pain patient will be admitted for further pain control and hydration. On call urologist Dr. Noah Delaine was consulted.    Assessment & Plan:   Principal Problem:   Intractable nausea and vomiting Active Problems:   Essential hypertension   Atrial fibrillation (HCC)   EMPHYSEMA   ARF (acute renal failure) (HCC)   Urolithiasis   Abdominal pain  1-Right side hydronephrosis Proximal 4 x 6 mm stone in the right ureter;  Continue with IV fluids.  tylenol.  PRN nausea medications.  Urology consulted.  CT renal protocol; Moderate to marked right hydronephrosis and proximal hydroureter, secondary to a 4 x 6 mm stone in the proximal right ureter several cm past the right UPJ. UA negative for infection. Urine culture no growth.   2-Acute Renal failure; obstructive uropathy.  Urology consulted.  Continue with IV fluids.  Improved cr 0.8.  Hallucination;  Suspect related to medications post anesthesia.  Monitor overnight.  Neuro check.   HTN; PRN hydralazine.   Persistent A. Fib - Continue flecainide and metoprolol. Sinus rhythm.   COPD ; stable.   DVT prophylaxis: scd Code Status: full code.  Family Communication: care discussed with patient.  Disposition Plan: home at time of discharge   Consultants:   Urology    Procedures: none   Antimicrobials: none   Subjective: Complaining of  nausea, vomiting.  Flank pain.   Objective: Vitals:   08/11/16 1627 08/11/16 1640 08/11/16 2139 08/12/16 0411  BP:  (!) 174/57 (!) 147/52 (!) 155/60  Pulse: 79  80 64  Resp: 16 16 20 16   Temp:  98.2 F (36.8 C) 97.9 F (36.6 C) 97.6 F (36.4 C)  TempSrc:   Oral Oral  SpO2: 93% 93% 94% 97%  Weight:      Height:        Intake/Output Summary (Last 24 hours) at 08/12/16 1407 Last data filed at 08/12/16 1333  Gross per 24 hour  Intake          3171.67 ml  Output             1455 ml  Net          1716.67 ml   Filed Weights   08/11/16 1333  Weight: 51.3 kg (113 lb)    Examination:  General exam: no distress.  Respiratory system: CTA Cardiovascular system: S 1, S 2 RRR, no edema  Gastrointestinal system: BS present, soft, nt Central nervous system: alert and oriented, hard of hearing Extremities: moves all 4 extremities.  Skin: No rashes, lesions or ulcers     Data Reviewed: I have personally reviewed following labs and imaging studies  CBC:  Recent Labs Lab 08/10/16 1551 08/10/16 2025 08/11/16 0800 08/12/16 0719  WBC 11.9* 12.3* 14.1* 12.5*  NEUTROABS  --  8.6* 11.2*  --   HGB 10.9* 12.5 10.4* 9.5*  HCT 33.8* 38.9 32.4* 30.5*  MCV 87.9  88.0 89.0 88.9  PLT  --  319 322 423   Basic Metabolic Panel:  Recent Labs Lab 08/10/16 1532 08/10/16 2025 08/11/16 0800 08/12/16 0719  NA 145* 142 142 142  K 5.2 4.6 4.8 4.1  CL 107* 109 110 114*  CO2 23 23 21* 24  GLUCOSE 75 66 84 157*  BUN 26 26* 28* 18  CREATININE 2.02* 2.06* 1.81* 0.84  CALCIUM 9.5 9.4 8.6* 8.2*   GFR: Estimated Creatinine Clearance: 44.7 mL/min (by C-G formula based on SCr of 0.84 mg/dL). Liver Function Tests:  Recent Labs Lab 08/10/16 1532 08/10/16 2025 08/11/16 0800  AST 35 37 36  ALT 17 17 14   ALKPHOS 86 82 72  BILITOT 0.2 0.6 0.6  PROT 6.4 7.2 6.2*  ALBUMIN 4.0 3.9 3.4*    Recent Labs Lab 08/10/16 2025  LIPASE 34   No results for input(s): AMMONIA in the last 168  hours. Coagulation Profile: No results for input(s): INR, PROTIME in the last 168 hours. Cardiac Enzymes: No results for input(s): CKTOTAL, CKMB, CKMBINDEX, TROPONINI in the last 168 hours. BNP (last 3 results) No results for input(s): PROBNP in the last 8760 hours. HbA1C: No results for input(s): HGBA1C in the last 72 hours. CBG: No results for input(s): GLUCAP in the last 168 hours. Lipid Profile: No results for input(s): CHOL, HDL, LDLCALC, TRIG, CHOLHDL, LDLDIRECT in the last 72 hours. Thyroid Function Tests: No results for input(s): TSH, T4TOTAL, FREET4, T3FREE, THYROIDAB in the last 72 hours. Anemia Panel: No results for input(s): VITAMINB12, FOLATE, FERRITIN, TIBC, IRON, RETICCTPCT in the last 72 hours. Sepsis Labs: No results for input(s): PROCALCITON, LATICACIDVEN in the last 168 hours.  Recent Results (from the past 240 hour(s))  Urine Culture     Status: None   Collection Time: 08/10/16  7:15 PM  Result Value Ref Range Status   Specimen Description URINE, RANDOM  Final   Special Requests NONE  Final   Culture   Final    NO GROWTH Performed at [facility], [address], [place], [place] [address]    Report Status 08/12/2016 FINAL  Final         Radiology Studies: Dg Abd 1 View  Result Date: 08/11/2016 CLINICAL DATA:  Cystoscopy and right ureteral stent placement for a right ureteral calculus with hydronephrosis. EXAM: ABDOMEN - 1 VIEW COMPARISON:  Abdomen and pelvis CT dated 08/10/2016. FINDINGS: Twelve C-arm views of the right pelvis and mid abdomen were obtained. These demonstrate retrograde injection of contrast in the rib right ureter. The ureter is normal in caliber and there is mild to moderate dilatation of the right renal collecting system. There is an interval oval filling defect in the upper pole collecting system on the right, otherwise without visualization of the previously seen calculus in the right ureter. There is subsequent placement of a  right ureteral stent in satisfactory position. IMPRESSION: 1. Satisfactory placement of a right ureteral stent. 2. The previously demonstrated right ureteral calculus has been displaced into the upper pole collecting system on the right. 3. Improved hydronephrosis on the right. Electronically Signed   By: Claudie Revering M.D.   On: 08/11/2016 15:50   Dg Abd Acute W/chest  Result Date: 08/10/2016 CLINICAL DATA:  Abdominal pain EXAM: DG ABDOMEN ACUTE W/ 1V CHEST COMPARISON:  Chest radiograph April 03, 2016 FINDINGS: PA chest: There is stable apical pleural thickening bilaterally. There is no edema or consolidation. Heart is borderline enlarged with pulmonary vascularity within normal  limits. There is extensive aortic atherosclerotic calcification. No adenopathy. Supine and upright abdomen: There is no bowel dilatation or air-fluid level to suggest bowel obstruction. No free air. There are surgical clips in the left upper quadrant. There is aortoiliac atherosclerosis. There is thoracolumbar dextroscoliosis. IMPRESSION: No bowel obstruction or free air. Surgical clips left upper quadrant. Apical pleural thickening bilaterally, stable. No edema or consolidation. Stable cardiac silhouette. There is aortoiliac atherosclerosis. Electronically Signed   By: Lowella Grip III M.D.   On: 08/10/2016 16:09   Dg C-arm 1-60 Min-no Report  Result Date: 08/11/2016 Fluoroscopy was utilized by the requesting physician.  No radiographic interpretation.   Ct Renal Stone Study  Result Date: 08/10/2016 CLINICAL DATA:  Right sided abdominal pain with nausea and vomiting for 2 days EXAM: CT ABDOMEN AND PELVIS WITHOUT CONTRAST TECHNIQUE: Multidetector CT imaging of the abdomen and pelvis was performed following the standard protocol without IV contrast. COMPARISON:  08/10/2016 radiograph, CT 01/30/2005 FINDINGS: Lower chest: Mild emphysema at the lung bases with scarring at the lingula and left base. No acute consolidation or  effusion. There is moderate cardiomegaly. Densely calcified distal thoracic aorta. Hepatobiliary: No focal liver abnormality is seen. No gallstones, gallbladder wall thickening, or biliary dilatation. Pancreas: Unremarkable. No pancreatic ductal dilatation or surrounding inflammatory changes. Spleen: Status post splenectomy. Probable splenule in the left upper quadrant. Adjacent dystrophic calcification. Adrenals/Urinary Tract: Adrenal glands are within normal limits. 2 mm stone upper pole left kidney. Enlarged right kidney with moderate hydronephrosis and proximal hydroureter. This is secondary a 4 mm AP x 6 mm craniocaudad stone in the proximal right ureter, several cm past the right UPJ. Urinary bladder is empty Stomach/Bowel: Stomach nonenlarged. No dilated small bowel. No colon wall thickening. Colon diverticular disease without acute inflammation. Suspect prior appendectomy. Vascular/Lymphatic: Densely calcified aorta and branch vessels. No aneurysmal dilatation. No significantly enlarged lymph nodes. Reproductive: Status post hysterectomy. No adnexal masses. Other: Small free fluid in the pelvis.  No free air. Musculoskeletal: Heterogenous attenuation of the vertebral bodies with mixed sclerotic and lucent lesions. No acute osseous abnormality. IMPRESSION: 1. Moderate to marked right hydronephrosis and proximal hydroureter, secondary to a 4 x 6 mm stone in the proximal right ureter several cm past the right UPJ. 2. Small amount of free fluid in the pelvis 3. Extensive atherosclerotic calcific disease of the aorta 4. Cardiomegaly 5. Status post splenectomy with probable splenule in the left upper quadrant 6. Heterogeneous attenuation of the vertebral bodies, could relate to osteopenia although marrow infiltrative process is also a consideration. Suggest correlation with nonemergent MRI. Electronically Signed   By: Donavan Foil M.D.   On: 08/10/2016 19:43        Scheduled Meds: . cholecalciferol  1,000  Units Oral Daily  . clonazePAM  0.5 mg Oral QHS  . cyanocobalamin  500 mcg Oral Daily  . flecainide  50 mg Oral BID  . lactose free nutrition  237 mL Oral Q breakfast  . metoprolol tartrate  12.5 mg Oral BID  . pantoprazole  40 mg Oral Daily  . umeclidinium-vilanterol  1 puff Inhalation Daily   Continuous Infusions: . dextrose 5 % and 0.9% NaCl 100 mL/hr at 08/12/16 0516     LOS: 0 days    Time spent: 35 minutes .     Elmarie Shiley, MD [facility] Pager [phone]  If 7PM-7AM, please contact night-coverage [email or web address] Password [email or web address] 08/12/2016, 2:07 PM

## 2016-08-13 ENCOUNTER — Inpatient Hospital Stay (HOSPITAL_COMMUNITY): Payer: Medicare Other

## 2016-08-13 DIAGNOSIS — N179 Acute kidney failure, unspecified: Secondary | ICD-10-CM | POA: Diagnosis not present

## 2016-08-13 DIAGNOSIS — N200 Calculus of kidney: Secondary | ICD-10-CM | POA: Diagnosis not present

## 2016-08-13 DIAGNOSIS — N1330 Unspecified hydronephrosis: Secondary | ICD-10-CM | POA: Diagnosis present

## 2016-08-13 DIAGNOSIS — I10 Essential (primary) hypertension: Secondary | ICD-10-CM | POA: Diagnosis not present

## 2016-08-13 DIAGNOSIS — Z7982 Long term (current) use of aspirin: Secondary | ICD-10-CM | POA: Diagnosis not present

## 2016-08-13 DIAGNOSIS — J189 Pneumonia, unspecified organism: Secondary | ICD-10-CM | POA: Diagnosis not present

## 2016-08-13 DIAGNOSIS — J438 Other emphysema: Secondary | ICD-10-CM | POA: Diagnosis not present

## 2016-08-13 DIAGNOSIS — I5033 Acute on chronic diastolic (congestive) heart failure: Secondary | ICD-10-CM | POA: Diagnosis not present

## 2016-08-13 DIAGNOSIS — I110 Hypertensive heart disease with heart failure: Secondary | ICD-10-CM | POA: Diagnosis present

## 2016-08-13 DIAGNOSIS — J9601 Acute respiratory failure with hypoxia: Secondary | ICD-10-CM

## 2016-08-13 DIAGNOSIS — R197 Diarrhea, unspecified: Secondary | ICD-10-CM | POA: Diagnosis not present

## 2016-08-13 DIAGNOSIS — R112 Nausea with vomiting, unspecified: Secondary | ICD-10-CM | POA: Diagnosis not present

## 2016-08-13 DIAGNOSIS — Z8249 Family history of ischemic heart disease and other diseases of the circulatory system: Secondary | ICD-10-CM | POA: Diagnosis not present

## 2016-08-13 DIAGNOSIS — I480 Paroxysmal atrial fibrillation: Secondary | ICD-10-CM | POA: Diagnosis not present

## 2016-08-13 DIAGNOSIS — I272 Pulmonary hypertension, unspecified: Secondary | ICD-10-CM | POA: Diagnosis present

## 2016-08-13 DIAGNOSIS — R0902 Hypoxemia: Secondary | ICD-10-CM | POA: Diagnosis not present

## 2016-08-13 DIAGNOSIS — Z833 Family history of diabetes mellitus: Secondary | ICD-10-CM | POA: Diagnosis not present

## 2016-08-13 DIAGNOSIS — I481 Persistent atrial fibrillation: Secondary | ICD-10-CM | POA: Diagnosis present

## 2016-08-13 DIAGNOSIS — K5900 Constipation, unspecified: Secondary | ICD-10-CM | POA: Diagnosis present

## 2016-08-13 DIAGNOSIS — Z9081 Acquired absence of spleen: Secondary | ICD-10-CM | POA: Diagnosis not present

## 2016-08-13 DIAGNOSIS — I351 Nonrheumatic aortic (valve) insufficiency: Secondary | ICD-10-CM | POA: Diagnosis not present

## 2016-08-13 DIAGNOSIS — G92 Toxic encephalopathy: Secondary | ICD-10-CM | POA: Diagnosis not present

## 2016-08-13 DIAGNOSIS — Z87891 Personal history of nicotine dependence: Secondary | ICD-10-CM | POA: Diagnosis not present

## 2016-08-13 DIAGNOSIS — E860 Dehydration: Secondary | ICD-10-CM | POA: Diagnosis present

## 2016-08-13 DIAGNOSIS — K219 Gastro-esophageal reflux disease without esophagitis: Secondary | ICD-10-CM | POA: Diagnosis present

## 2016-08-13 DIAGNOSIS — J449 Chronic obstructive pulmonary disease, unspecified: Secondary | ICD-10-CM | POA: Diagnosis present

## 2016-08-13 DIAGNOSIS — R410 Disorientation, unspecified: Secondary | ICD-10-CM | POA: Diagnosis not present

## 2016-08-13 DIAGNOSIS — T4145XA Adverse effect of unspecified anesthetic, initial encounter: Secondary | ICD-10-CM | POA: Diagnosis not present

## 2016-08-13 DIAGNOSIS — J90 Pleural effusion, not elsewhere classified: Secondary | ICD-10-CM | POA: Diagnosis not present

## 2016-08-13 DIAGNOSIS — R443 Hallucinations, unspecified: Secondary | ICD-10-CM | POA: Diagnosis present

## 2016-08-13 DIAGNOSIS — N132 Hydronephrosis with renal and ureteral calculous obstruction: Secondary | ICD-10-CM | POA: Diagnosis present

## 2016-08-13 LAB — BASIC METABOLIC PANEL
Anion gap: 5 (ref 5–15)
BUN: 13 mg/dL (ref 6–20)
CO2: 26 mmol/L (ref 22–32)
Calcium: 8.6 mg/dL — ABNORMAL LOW (ref 8.9–10.3)
Chloride: 110 mmol/L (ref 101–111)
Creatinine, Ser: 0.75 mg/dL (ref 0.44–1.00)
GFR calc Af Amer: >60 mL/min (ref >60)
GFR calc non Af Amer: >60 mL/min (ref >60)
Glucose, Bld: 91 mg/dL (ref 65–99)
Potassium: 4.2 mmol/L (ref 3.5–5.1)
Sodium: 141 mmol/L (ref 135–145)

## 2016-08-13 LAB — BLOOD GAS, ARTERIAL
Acid-base deficit: 1.8 mmol/L (ref 0.0–2.0)
Bicarbonate: 22.3 mmol/L (ref 20.0–28.0)
Drawn by: 103701
O2 Content: 3 L/min
O2 Saturation: 91.3 %
Patient temperature: 98.6
pCO2 arterial: 37.7 mmHg (ref 32.0–48.0)
pH, Arterial: 7.389 (ref 7.350–7.450)
pO2, Arterial: 64.2 mmHg — ABNORMAL LOW (ref 83.0–108.0)

## 2016-08-13 LAB — CBC
HCT: 32.6 % — ABNORMAL LOW (ref 36.0–46.0)
Hemoglobin: 10.2 g/dL — ABNORMAL LOW (ref 12.0–15.0)
MCH: 28.3 pg (ref 26.0–34.0)
MCHC: 31.3 g/dL (ref 30.0–36.0)
MCV: 90.6 fL (ref 78.0–100.0)
Platelets: 354 10*3/uL (ref 150–400)
RBC: 3.6 MIL/uL — ABNORMAL LOW (ref 3.87–5.11)
RDW: 17.1 % — ABNORMAL HIGH (ref 11.5–15.5)
WBC: 19.4 10*3/uL — ABNORMAL HIGH (ref 4.0–10.5)

## 2016-08-13 LAB — BRAIN NATRIURETIC PEPTIDE: B Natriuretic Peptide: 2096.5 pg/mL — ABNORMAL HIGH (ref 0.0–100.0)

## 2016-08-13 MED ORDER — DEXTROSE 5 % IV SOLN: 1.0000 g | INTRAVENOUS | Status: DC

## 2016-08-13 MED ORDER — DEXTROSE 5 % IV SOLN
500.0000 mg | INTRAVENOUS | Status: DC
  Administered 2016-08-13 – 2016-08-14 (×2): 500 mg via INTRAVENOUS
  Filled 2016-08-13 (×2): qty 500

## 2016-08-13 MED ORDER — PIPERACILLIN-TAZOBACTAM 3.375 G IVPB
3.3750 g | Freq: Three times a day (TID) | INTRAVENOUS | Status: DC
  Administered 2016-08-13 – 2016-08-15 (×6): 3.375 g via INTRAVENOUS
  Filled 2016-08-13 (×7): qty 50

## 2016-08-13 MED ORDER — FUROSEMIDE 10 MG/ML IJ SOLN
40.0000 mg | Freq: Once | INTRAMUSCULAR | Status: AC
  Administered 2016-08-13: 40 mg via INTRAVENOUS
  Filled 2016-08-13: qty 4

## 2016-08-13 MED ORDER — IPRATROPIUM-ALBUTEROL 0.5-2.5 (3) MG/3ML IN SOLN
3.0000 mL | Freq: Three times a day (TID) | RESPIRATORY_TRACT | Status: DC
  Administered 2016-08-14: 3 mL via RESPIRATORY_TRACT
  Filled 2016-08-13: qty 3

## 2016-08-13 MED ORDER — POTASSIUM CHLORIDE CRYS ER 20 MEQ PO TBCR
20.0000 meq | EXTENDED_RELEASE_TABLET | Freq: Once | ORAL | Status: AC
  Administered 2016-08-13: 20 meq via ORAL
  Filled 2016-08-13: qty 1

## 2016-08-13 MED ORDER — FUROSEMIDE 10 MG/ML IJ SOLN
20.0000 mg | Freq: Once | INTRAMUSCULAR | Status: AC
  Administered 2016-08-13: 20 mg via INTRAVENOUS
  Filled 2016-08-13: qty 2

## 2016-08-13 MED ORDER — VANCOMYCIN HCL 500 MG IV SOLR
500.0000 mg | Freq: Two times a day (BID) | INTRAVENOUS | Status: DC
  Administered 2016-08-13 – 2016-08-15 (×4): 500 mg via INTRAVENOUS
  Filled 2016-08-13 (×7): qty 500

## 2016-08-13 MED ORDER — SODIUM CHLORIDE 0.9 % IV SOLN
3.0000 g | Freq: Four times a day (QID) | INTRAVENOUS | Status: DC
  Filled 2016-08-13: qty 3

## 2016-08-13 MED ORDER — IPRATROPIUM-ALBUTEROL 0.5-2.5 (3) MG/3ML IN SOLN
3.0000 mL | Freq: Four times a day (QID) | RESPIRATORY_TRACT | Status: DC
  Administered 2016-08-13 (×3): 3 mL via RESPIRATORY_TRACT
  Filled 2016-08-13 (×2): qty 3

## 2016-08-13 NOTE — Progress Notes (Signed)
PROGRESS NOTE    [name]  Coffey DOB: 08-22-1935 DOA: 08/10/2016 PCP: Cassandria Anger, MD    Brief Narrative: [name] is a 81 y.o. female with history of atrial fibrillation, COPD presents to the ER with complaints of persistent nausea vomiting and right flank pain for the last 3 days. Denies any diarrhea fever or chills.   ED Course: In the ER CT scan of the abdomen shows right-sided ureter stone with hydronephrosis. Labs revealed acute renal failure. UA is not showing any features of UTI. Patient is afebrile. Since patient desiring persistent vomiting and pain patient will be admitted for further pain control and hydration. On call urologist Dr. Noah Delaine was consulted.    Assessment & Plan:   Principal Problem:   Intractable nausea and vomiting Active Problems:   Essential hypertension   Atrial fibrillation (HCC)   EMPHYSEMA   ARF (acute renal failure) (HCC)   Urolithiasis   Abdominal pain   Kidney stone  1-Right side hydronephrosis Proximal 4 x 6 mm stone in the right ureter;  tylenol.  PRN nausea medications.  Urology consulted.  CT renal protocol; Moderate to marked right hydronephrosis and proximal hydroureter, secondary to a 4 x 6 mm stone in the proximal right ureter several cm past the right UPJ. UA negative for infection. Urine culture no growth.   2-Acute Renal failure; obstructive uropathy.  Urology consulted.  Treated with IV fluids.  Improved cr 0.8.  Acute hypoxic Respiratory failure;  She has history of COPD. Lung sound diminished. She started to cough.  Will check ABG, Stat.  Chest x ray. Depending on results might need antibiotics vs lasix.  Start nebulizer treatments.  BNP.   Hallucination;  Suspect related to medications post anesthesia.  Patient more confuse today. Will check CT head, also chest x ray rule out infection.  ABG ordered.   HTN; PRN hydralazine.   Persistent A. Fib - Continue flecainide and metoprolol.  Sinus rhythm.   COPD ; stable.   DVT prophylaxis: scd Code Status: full code.  Family Communication: care discussed with patient.  Disposition Plan: home at time of discharge   Consultants:   Urology    Procedures: none   Antimicrobials: none   Subjective: She appears more confuse and sleepy/  Report SOB, having hard time breathing.  Oxygen sat in the 90 %    Objective: Vitals:   08/13/16 0147 08/13/16 0618 08/13/16 0738 08/13/16 0739  BP: (!) 154/56 126/61    Pulse:  80    Resp:  20    Temp:  98.3 F (36.8 C)    TempSrc:  Oral    SpO2:  95% (!) 86% 90%  Weight:      Height:        Intake/Output Summary (Last 24 hours) at 08/13/16 0947 Last data filed at 08/13/16 0913  Gross per 24 hour  Intake              920 ml  Output             1300 ml  Net             -380 ml   Filed Weights   08/11/16 1333  Weight: 51.3 kg (113 lb)    Examination:  General exam: no distress. Sleepy. Wake up anwser some questions.  Respiratory system: Mild tachypnea, decrease breath sound, few ronchus.  Cardiovascular system; S 1, S 2 RRR, no edema Gastrointestinal system: BS presents, soft, nt  Central nervous system: generalized weakness, sleepy, wake up answer some questions.  Extremities: moves all 4 extremities.  Skin: No rashes, lesions or ulcers     Data Reviewed: I have personally reviewed following labs and imaging studies  CBC:  Recent Labs Lab 08/10/16 1551 08/10/16 2025 08/11/16 0800 08/12/16 0719  WBC 11.9* 12.3* 14.1* 12.5*  NEUTROABS  --  8.6* 11.2*  --   HGB 10.9* 12.5 10.4* 9.5*  HCT 33.8* 38.9 32.4* 30.5*  MCV 87.9 88.0 89.0 88.9  PLT  --  319 322 557   Basic Metabolic Panel:  Recent Labs Lab 08/10/16 1532 08/10/16 2025 08/11/16 0800 08/12/16 0719  NA 145* 142 142 142  K 5.2 4.6 4.8 4.1  CL 107* 109 110 114*  CO2 23 23 21* 24  GLUCOSE 75 66 84 157*  BUN 26 26* 28* 18  CREATININE 2.02* 2.06* 1.81* 0.84  CALCIUM 9.5 9.4 8.6* 8.2*     GFR: Estimated Creatinine Clearance: 44.7 mL/min (by C-G formula based on SCr of 0.84 mg/dL). Liver Function Tests:  Recent Labs Lab 08/10/16 1532 08/10/16 2025 08/11/16 0800  AST 35 37 36  ALT 17 17 14   ALKPHOS 86 82 72  BILITOT 0.2 0.6 0.6  PROT 6.4 7.2 6.2*  ALBUMIN 4.0 3.9 3.4*    Recent Labs Lab 08/10/16 2025  LIPASE 34   No results for input(s): AMMONIA in the last 168 hours. Coagulation Profile: No results for input(s): INR, PROTIME in the last 168 hours. Cardiac Enzymes: No results for input(s): CKTOTAL, CKMB, CKMBINDEX, TROPONINI in the last 168 hours. BNP (last 3 results) No results for input(s): PROBNP in the last 8760 hours. HbA1C: No results for input(s): HGBA1C in the last 72 hours. CBG: No results for input(s): GLUCAP in the last 168 hours. Lipid Profile: No results for input(s): CHOL, HDL, LDLCALC, TRIG, CHOLHDL, LDLDIRECT in the last 72 hours. Thyroid Function Tests: No results for input(s): TSH, T4TOTAL, FREET4, T3FREE, THYROIDAB in the last 72 hours. Anemia Panel: No results for input(s): VITAMINB12, FOLATE, FERRITIN, TIBC, IRON, RETICCTPCT in the last 72 hours. Sepsis Labs: No results for input(s): PROCALCITON, LATICACIDVEN in the last 168 hours.  Recent Results (from the past 240 hour(s))  Urine Culture     Status: None   Collection Time: 08/10/16  7:15 PM  Result Value Ref Range Status   Specimen Description URINE, RANDOM  Final   Special Requests NONE  Final   Culture   Final    NO GROWTH Performed at [facility], [address] [place], [place] [address]    Report Status 08/12/2016 FINAL  Final         Radiology Studies: Dg Abd 1 View  Result Date: 08/11/2016 CLINICAL DATA:  Cystoscopy and right ureteral stent placement for a right ureteral calculus with hydronephrosis. EXAM: ABDOMEN - 1 VIEW COMPARISON:  Abdomen and pelvis CT dated 08/10/2016. FINDINGS: Twelve C-arm views of the right pelvis and mid abdomen were  obtained. These demonstrate retrograde injection of contrast in the rib right ureter. The ureter is normal in caliber and there is mild to moderate dilatation of the right renal collecting system. There is an interval oval filling defect in the upper pole collecting system on the right, otherwise without visualization of the previously seen calculus in the right ureter. There is subsequent placement of a right ureteral stent in satisfactory position. IMPRESSION: 1. Satisfactory placement of a right ureteral stent. 2. The previously demonstrated right ureteral calculus has  been displaced into the upper pole collecting system on the right. 3. Improved hydronephrosis on the right. Electronically Signed   By: Claudie Revering M.D.   On: 08/11/2016 15:50   Dg C-arm 1-60 Min-no Report  Result Date: 08/11/2016 Fluoroscopy was utilized by the requesting physician.  No radiographic interpretation.        Scheduled Meds: . cholecalciferol  1,000 Units Oral Daily  . clonazePAM  0.5 mg Oral QHS  . cyanocobalamin  500 mcg Oral Daily  . flecainide  50 mg Oral BID  . ipratropium-albuterol  3 mL Nebulization Q6H  . lactose free nutrition  237 mL Oral Q breakfast  . metoprolol tartrate  12.5 mg Oral BID  . pantoprazole  40 mg Oral Daily  . umeclidinium-vilanterol  1 puff Inhalation Daily   Continuous Infusions:    LOS: 0 days    Time spent: 35 minutes .     Elmarie Shiley, MD [facility] Pager [phone]  If 7PM-7AM, please contact night-coverage [email or web address] Password [facility] 08/13/2016, 9:47 AM

## 2016-08-13 NOTE — Progress Notes (Addendum)
Pharmacy Antibiotic Note  [name] is a 81 y.o. female admitted on 08/10/2016 with aspiration pneumonia.   Pt has history of afib, COPD presents to the ER with complaints of persistent nausea vomiting and right flank pain for the last 3 days. Denies any diarrhea fever or chills.  Pharmacy has been consulted for unasyn dosing.  Scr 0.84, CrCl ~ 51mls/min   Update: pharmacy now consulted for vancomycin and zosyn for pneumonia, unasyn stopped  Plan: Vancomycin 500mg  IV q12h Zosyn 3.375g IV Q8H infused over 4hrs. Daily Scr vanc trough at steady state Pt states allergy to PCN: "makes her crazy", not a true allergy--follow  Height: 5\' 3"  (160 cm) Weight: 113 lb (51.3 kg) IBW/kg (Calculated) : 52.4  Temp (24hrs), Avg:97.9 F (36.6 C), Min:97.5 F (36.4 C), Max:98.3 F (36.8 C)   Recent Labs Lab 08/10/16 1532 08/10/16 1551 08/10/16 2025 08/11/16 0800 08/12/16 0719 08/13/16 1031  WBC  --  11.9* 12.3* 14.1* 12.5* 19.4*  CREATININE 2.02*  --  2.06* 1.81* 0.84  --     Estimated Creatinine Clearance: 44.7 mL/min (by C-G formula based on SCr of 0.84 mg/dL).    Allergies  Allergen Reactions  . Amoxicillin     "makes me crazy"  . Codeine     REACTION: Nausea  . Levaquin [Levofloxacin In D5w]     n/v  . Other     Pt does not want to take any pain meds    Antimicrobials this admission: 6/10 azithromycin >> 6/10 vanc >> 6/10 zosyn >>   Thank you for allowing pharmacy to be a part of this patient's care.  Dolly Rias RPh 08/13/2016, 10:58 AM Pager [phone]

## 2016-08-14 ENCOUNTER — Other Ambulatory Visit: Payer: Self-pay | Admitting: Internal Medicine

## 2016-08-14 LAB — BASIC METABOLIC PANEL
Anion gap: 7 (ref 5–15)
BUN: 15 mg/dL (ref 6–20)
CO2: 30 mmol/L (ref 22–32)
Calcium: 8.3 mg/dL — ABNORMAL LOW (ref 8.9–10.3)
Chloride: 104 mmol/L (ref 101–111)
Creatinine, Ser: 0.84 mg/dL (ref 0.44–1.00)
GFR calc Af Amer: >60 mL/min (ref >60)
GFR calc non Af Amer: >60 mL/min (ref >60)
Glucose, Bld: 87 mg/dL (ref 65–99)
Potassium: 3.5 mmol/L (ref 3.5–5.1)
Sodium: 141 mmol/L (ref 135–145)

## 2016-08-14 LAB — CBC
HCT: 30.9 % — ABNORMAL LOW (ref 36.0–46.0)
Hemoglobin: 9.8 g/dL — ABNORMAL LOW (ref 12.0–15.0)
MCH: 28.2 pg (ref 26.0–34.0)
MCHC: 31.7 g/dL (ref 30.0–36.0)
MCV: 88.8 fL (ref 78.0–100.0)
Platelets: 335 10*3/uL (ref 150–400)
RBC: 3.48 MIL/uL — ABNORMAL LOW (ref 3.87–5.11)
RDW: 16.9 % — ABNORMAL HIGH (ref 11.5–15.5)
WBC: 13.8 10*3/uL — ABNORMAL HIGH (ref 4.0–10.5)

## 2016-08-14 MED ORDER — POTASSIUM CHLORIDE CRYS ER 20 MEQ PO TBCR
40.0000 meq | EXTENDED_RELEASE_TABLET | Freq: Once | ORAL | Status: AC
  Administered 2016-08-14: 40 meq via ORAL
  Filled 2016-08-14: qty 2

## 2016-08-14 MED ORDER — AZITHROMYCIN 250 MG PO TABS
500.0000 mg | ORAL_TABLET | Freq: Every day | ORAL | Status: DC
  Administered 2016-08-15 – 2016-08-17 (×3): 500 mg via ORAL
  Filled 2016-08-14 (×3): qty 2

## 2016-08-14 MED ORDER — IPRATROPIUM-ALBUTEROL 0.5-2.5 (3) MG/3ML IN SOLN
3.0000 mL | Freq: Two times a day (BID) | RESPIRATORY_TRACT | Status: DC
  Administered 2016-08-14 – 2016-08-15 (×2): 3 mL via RESPIRATORY_TRACT
  Filled 2016-08-14 (×2): qty 3

## 2016-08-14 MED ORDER — FUROSEMIDE 10 MG/ML IJ SOLN
20.0000 mg | Freq: Two times a day (BID) | INTRAMUSCULAR | Status: DC
  Administered 2016-08-14 – 2016-08-15 (×2): 20 mg via INTRAVENOUS
  Filled 2016-08-14 (×2): qty 2

## 2016-08-14 MED ORDER — IPRATROPIUM-ALBUTEROL 0.5-2.5 (3) MG/3ML IN SOLN: 3.0000 mL | Freq: Four times a day (QID) | RESPIRATORY_TRACT | Status: DC | PRN

## 2016-08-14 NOTE — Progress Notes (Signed)
PROGRESS NOTE    [name]  HUD:[id number] DOB: [birth year] DOA: 08/10/2016 PCP: Cassandria Anger, MD    Brief Narrative: [name] is a 81 y.o. female with history of atrial fibrillation, COPD presents to the ER with complaints of persistent nausea vomiting and right flank pain for the last 3 days. Denies any diarrhea fever or chills.   ED Course: In the ER CT scan of the abdomen shows right-sided ureter stone with hydronephrosis. Labs revealed acute renal failure. UA is not showing any features of UTI. Patient is afebrile. Since patient desiring persistent vomiting and pain patient will be admitted for further pain control and hydration. On call urologist Dr. Noah Delaine was consulted.    Assessment & Plan:   Principal Problem:   Intractable nausea and vomiting Active Problems:   Essential hypertension   Atrial fibrillation (HCC)   EMPHYSEMA   ARF (acute renal failure) (HCC)   Urolithiasis   Abdominal pain   Kidney stone  1-Right side hydronephrosis Proximal 4 x 6 mm stone in the right ureter;  tylenol.  PRN nausea medications.  Urology consulted.  CT renal protocol; Moderate to marked right hydronephrosis and proximal hydroureter, secondary to a 4 x 6 mm stone in the proximal right ureter several cm past the right UPJ. UA negative for infection. Urine culture no growth.   2-Acute Renal failure; obstructive uropathy.  Urology consulted.  Treated with IV fluids.  Improved cr 0.8.  Acute hypoxic Respiratory failure;  She has history of COPD. Lung sound diminished. She started to cough.  ABG negative for hypercapnia.  Chest x ray with infiltrates, pulmonary edema.  IV lasix, IV antibiotics.  Start nebulizer treatments.   Acute diastolic HF exacerbation.  IV Lasix BID.  Elevated BNP, Chest x ray with pulmonary edema.  Continue with lasix.   PNA, health care associated.  Leukocytosis, mild fever, chest x ray with basilar  air space diseases. Favor PNA.    Report productive cough.    Hallucination;  Multifactorial, medication post anesthesia vs hypoxemia infection.  Less confuse.  CT head negative.   HTN; PRN hydralazine.   Persistent A. Fib - Continue flecainide and metoprolol. Sinus rhythm.   COPD ; stable.   DVT prophylaxis: scd Code Status: full code.  Family Communication: care discussed with patient.  Disposition Plan: home at time of discharge   Consultants:   Urology    Procedures: none   Antimicrobials: none   Subjective: She is more alert, and less confuse.  She report productive cough.  She is breathing better, but not at baseline    Objective: Vitals:   08/13/16 1941 08/13/16 2102 08/14/16 0504 08/14/16 0852  BP:  (!) 119/53 (!) 159/72   Pulse: 80 91 76 88  Resp: 18 18 18 17   Temp:  98.9 F (37.2 C) 99.1 F (37.3 C)   TempSrc:  Oral Oral   SpO2: 95% 99% 98% 90%  Weight:      Height:        Intake/Output Summary (Last 24 hours) at 08/14/16 1101 Last data filed at 08/14/16 1032  Gross per 24 hour  Intake             1430 ml  Output             2750 ml  Net            -1320 ml   Filed Weights   08/11/16 1333  Weight: 51.3 kg (113 lb)  Examination:  General exam: alert, less confuse.  Respiratory system: respiratory effort normal, bilateral crackles.  Cardiovascular system; S 1, S 2 RRR, no edema Gastrointestinal system: Bs present, soft, nt Central nervous system:alert non focal Extremities: moves all 4 extremities.  Skin: No rashes, lesions or ulcers     Data Reviewed: I have personally reviewed following labs and imaging studies  CBC:  Recent Labs Lab 08/10/16 2025 08/11/16 0800 08/12/16 0719 08/13/16 1031 08/14/16 0541  WBC 12.3* 14.1* 12.5* 19.4* 13.8*  NEUTROABS 8.6* 11.2*  --   --   --   HGB 12.5 10.4* 9.5* 10.2* 9.8*  HCT 38.9 32.4* 30.5* 32.6* 30.9*  MCV 88.0 89.0 88.9 90.6 88.8  PLT 319 322 314 354 161   Basic Metabolic Panel:  Recent Labs Lab  08/10/16 2025 08/11/16 0800 08/12/16 0719 08/13/16 1031 08/14/16 0541  NA 142 142 142 141 141  K 4.6 4.8 4.1 4.2 3.5  CL 109 110 114* 110 104  CO2 23 21* 24 26 30   GLUCOSE 66 84 157* 91 87  BUN 26* 28* 18 13 15   CREATININE 2.06* 1.81* 0.84 0.75 0.84  CALCIUM 9.4 8.6* 8.2* 8.6* 8.3*   GFR: Estimated Creatinine Clearance: 44.7 mL/min (by C-G formula based on SCr of 0.84 mg/dL). Liver Function Tests:  Recent Labs Lab 08/10/16 1532 08/10/16 2025 08/11/16 0800  AST 35 37 36  ALT 17 17 14   ALKPHOS 86 82 72  BILITOT 0.2 0.6 0.6  PROT 6.4 7.2 6.2*  ALBUMIN 4.0 3.9 3.4*    Recent Labs Lab 08/10/16 2025  LIPASE 34   No results for input(s): AMMONIA in the last 168 hours. Coagulation Profile: No results for input(s): INR, PROTIME in the last 168 hours. Cardiac Enzymes: No results for input(s): CKTOTAL, CKMB, CKMBINDEX, TROPONINI in the last 168 hours. BNP (last 3 results) No results for input(s): PROBNP in the last 8760 hours. HbA1C: No results for input(s): HGBA1C in the last 72 hours. CBG: No results for input(s): GLUCAP in the last 168 hours. Lipid Profile: No results for input(s): CHOL, HDL, LDLCALC, TRIG, CHOLHDL, LDLDIRECT in the last 72 hours. Thyroid Function Tests: No results for input(s): TSH, T4TOTAL, FREET4, T3FREE, THYROIDAB in the last 72 hours. Anemia Panel: No results for input(s): VITAMINB12, FOLATE, FERRITIN, TIBC, IRON, RETICCTPCT in the last 72 hours. Sepsis Labs: No results for input(s): PROCALCITON, LATICACIDVEN in the last 168 hours.  Recent Results (from the past 240 hour(s))  Urine Culture     Status: None   Collection Time: 08/10/16  7:15 PM  Result Value Ref Range Status   Specimen Description URINE, RANDOM  Final   Special Requests NONE  Final   Culture   Final    NO GROWTH Performed at [facility], [address], [place], [place] [address]    Report Status 08/12/2016 FINAL  Final         Radiology Studies: Ct Head Wo  Contrast  Result Date: 08/13/2016 CLINICAL DATA:  81 year old female with increasing confusion following admission for obstructive uropathy, acute renal failure. EXAM: CT HEAD WITHOUT CONTRAST TECHNIQUE: Contiguous axial images were obtained from the base of the skull through the vertex without intravenous contrast. COMPARISON:  Brain MRI 06/02/2008. FINDINGS: Brain: Cerebral volume is within normal limits for age. No midline shift, ventriculomegaly, mass effect, evidence of mass lesion, intracranial hemorrhage or evidence of cortically based acute infarction. Gray-white matter differentiation is within normal limits throughout the brain. Vascular: Calcified atherosclerosis at the  skull base. No suspicious intracranial vascular hyperdensity. Skull: No acute osseous abnormality identified. Sinuses/Orbits: Visualized paranasal sinuses and mastoids are stable and well pneumatized. Other: No acute orbit or scalp soft tissue finding. IMPRESSION: Normal for age non contrast CT appearance of the brain. Electronically Signed   By: Genevie Ann M.D.   On: 08/13/2016 12:02   Dg Chest Port 1 View  Result Date: 08/13/2016 CLINICAL DATA:  Congestion and nausea today. EXAM: PORTABLE CHEST 1 VIEW COMPARISON:  Single-view of the chest 08/10/2016. PA and lateral chest 04/03/2016. FINDINGS: There is cardiomegaly. Aortic atherosclerosis is noted. The patient has new bilateral pleural effusions and basilar airspace disease. The lungs appear emphysematous scratch the lungs are emphysematous. IMPRESSION: New small to moderate bilateral pleural effusions and basilar airspace disease, likely atelectasis. Cardiomegaly and interstitial pulmonary edema. Emphysema. Atherosclerosis. Electronically Signed   By: Inge Rise M.D.   On: 08/13/2016 10:17        Scheduled Meds: . cholecalciferol  1,000 Units Oral Daily  . clonazePAM  0.5 mg Oral QHS  . cyanocobalamin  500 mcg Oral Daily  . flecainide  50 mg Oral BID  .  ipratropium-albuterol  3 mL Nebulization BID  . lactose free nutrition  237 mL Oral Q breakfast  . metoprolol tartrate  12.5 mg Oral BID  . pantoprazole  40 mg Oral Daily   Continuous Infusions: . azithromycin Stopped (08/13/16 1144)  . piperacillin-tazobactam (ZOSYN)  IV Stopped (08/14/16 0843)  . vancomycin Stopped (08/14/16 0148)     LOS: 1 day    Time spent: 35 minutes .     Elmarie Shiley, MD [facility] Pager [phone]  If 7PM-7AM, please contact night-coverage [email or web address] Password [email or web address] 08/14/2016, 11:01 AM

## 2016-08-14 NOTE — Telephone Encounter (Signed)
Pt called checking on these refills. I told her that we just got the request today. She also wanted to let Dr Alain Marion know that she is in the hospital at [facility].

## 2016-08-14 NOTE — Progress Notes (Signed)
PHARMACIST - PHYSICIAN COMMUNICATION DR:   Tyrell Antonio CONCERNING: Antibiotic IV to Oral Route Change Policy  RECOMMENDATION: This patient is receiving azithromycin by the intravenous route.  Based on criteria approved by the Pharmacy and Therapeutics Committee, the antibiotic(s) is/are being converted to the equivalent oral dose form(s).   DESCRIPTION: These criteria include:  Patient being treated for a respiratory tract infection, urinary tract infection, cellulitis or clostridium difficile associated diarrhea if on metronidazole  The patient is not neutropenic and does not exhibit a GI malabsorption state  The patient is eating (either orally or via tube) and/or has been taking other orally administered medications for a least 24 hours  The patient is improving clinically and has a Tmax < 100.5  If you have questions about this conversion, please contact the Pharmacy Department  []   [phone] )  [facility] []   [phone] )  [facility] []   [phone] )  [facility] []   [phone] )  [facility] [x]   [phone] )  [place], [place].D. [phone] 08/14/2016 1:00 PM

## 2016-08-14 NOTE — Evaluation (Signed)
Physical Therapy One Time Evaluation Patient Details Name: [name] MRN: [id number] DOB: Apr 16, 1935 Today's Date: 08/14/2016   History of Present Illness  Pt is a 81 year old female s/p right ureteral stent for right UVJ stricture and right proximal stone with hx of COPD, afib  Clinical Impression  Patient evaluated by Physical Therapy with no further acute PT needs identified. All education has been completed and the patient has no further questions. Pt mobilizing well.  Pt's SPO2 did drop to 87% on room air end of ambulation so reapplied 2L O2 [place] (RN present and aware). See below for any follow-up Physical Therapy or equipment needs. PT is signing off. Thank you for this referral.     Follow Up Recommendations No PT follow up    Equipment Recommendations  None recommended by PT    Recommendations for Other Services       Precautions / Restrictions Precautions Precaution Comments: monitor sats      Mobility  Bed Mobility Overal bed mobility: Modified Independent                Transfers Overall transfer level: Needs assistance   Transfers: Sit to/from Stand Sit to Stand: Supervision            Ambulation/Gait Ambulation/Gait assistance: Min guard;Supervision Ambulation Distance (Feet): 140 Feet Assistive device: None Gait Pattern/deviations: Step-through pattern;Decreased stride length     General Gait Details: pt pushed IV pole, pt mobilizing well and states she ambulated earlier with nursing  Stairs            Wheelchair Mobility    Modified Rankin (Stroke Patients Only)       Balance Overall balance assessment:  (denies falls)                                           Pertinent Vitals/Pain Pain Assessment: 0-10 Pain Score: 3  Pain Location: abdomen Pain Descriptors / Indicators: Sore Pain Intervention(s): Monitored during session;Limited activity within patient's tolerance;Repositioned    Home Living  Family/patient expects to be discharged to:: Private residence Living Arrangements: Alone   Type of Home: House Home Access: Level entry     Home Layout: One level Home Equipment: None      Prior Function Level of Independence: Independent         Comments: 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, government        Extremity/Trunk Assessment        Lower Extremity Assessment Lower Extremity Assessment: Overall WFL for tasks assessed       Communication   Communication: No difficulties  Cognition Arousal/Alertness: Awake/alert Behavior During Therapy: WFL for tasks assessed/performed Overall Cognitive Status: Within Functional Limits for tasks assessed                                        General Comments      Exercises     Assessment/Plan    PT Assessment Patent does not need any further PT services  PT Problem List         PT Treatment Interventions      PT Goals (Current goals can be found in the Care Plan section)  Acute Rehab PT Goals PT Goal Formulation: All assessment  and education complete, DC therapy    Frequency     Barriers to discharge        Co-evaluation               AM-PAC PT "6 Clicks" Daily Activity  Outcome Measure Difficulty turning over in bed (including adjusting bedclothes, sheets and blankets)?: None Difficulty moving from lying on back to sitting on the side of the bed? : None Difficulty sitting down on and standing up from a chair with arms (e.g., wheelchair, bedside commode, etc,.)?: None Help needed moving to and from a bed to chair (including a wheelchair)?: None Help needed walking in hospital room?: None Help needed climbing 3-5 steps with a railing? : A Little 6 Click Score: 23    End of Session Equipment Utilized During Treatment: Oxygen Activity Tolerance: Patient tolerated treatment well Patient left: in bed;with call bell/phone within reach Nurse Communication: Mobility  status PT Visit Diagnosis: Difficulty in walking, not elsewhere classified (R26.2)    Time: 7129-2909 PT Time Calculation (min) (ACUTE ONLY): 10 min   Charges:   PT Evaluation $PT Eval Low Complexity: 1 Procedure     PT G CodesCarmelia Bake, PT, DPT 08/14/2016 Pager: [phone]   York Ram E 08/14/2016, 3:58 PM

## 2016-08-15 LAB — BASIC METABOLIC PANEL
Anion gap: 8 (ref 5–15)
BUN: 13 mg/dL (ref 6–20)
CO2: 32 mmol/L (ref 22–32)
Calcium: 8.5 mg/dL — ABNORMAL LOW (ref 8.9–10.3)
Chloride: 98 mmol/L — ABNORMAL LOW (ref 101–111)
Creatinine, Ser: 0.82 mg/dL (ref 0.44–1.00)
GFR calc Af Amer: >60 mL/min (ref >60)
GFR calc non Af Amer: >60 mL/min (ref >60)
Glucose, Bld: 90 mg/dL (ref 65–99)
Potassium: 3.3 mmol/L — ABNORMAL LOW (ref 3.5–5.1)
Sodium: 138 mmol/L (ref 135–145)

## 2016-08-15 LAB — CBC
HCT: 32.1 % — ABNORMAL LOW (ref 36.0–46.0)
Hemoglobin: 10.3 g/dL — ABNORMAL LOW (ref 12.0–15.0)
MCH: 27.8 pg (ref 26.0–34.0)
MCHC: 32.1 g/dL (ref 30.0–36.0)
MCV: 86.5 fL (ref 78.0–100.0)
Platelets: 358 10*3/uL (ref 150–400)
RBC: 3.71 MIL/uL — ABNORMAL LOW (ref 3.87–5.11)
RDW: 16.7 % — ABNORMAL HIGH (ref 11.5–15.5)
WBC: 11.5 10*3/uL — ABNORMAL HIGH (ref 4.0–10.5)

## 2016-08-15 LAB — MRSA PCR SCREENING: MRSA by PCR: NEGATIVE

## 2016-08-15 MED ORDER — SACCHAROMYCES BOULARDII 250 MG PO CAPS
250.0000 mg | ORAL_CAPSULE | Freq: Two times a day (BID) | ORAL | Status: DC
  Administered 2016-08-15 – 2016-08-17 (×5): 250 mg via ORAL
  Filled 2016-08-15 (×5): qty 1

## 2016-08-15 MED ORDER — POTASSIUM CHLORIDE CRYS ER 20 MEQ PO TBCR
40.0000 meq | EXTENDED_RELEASE_TABLET | Freq: Once | ORAL | Status: AC
  Administered 2016-08-15: 40 meq via ORAL
  Filled 2016-08-15: qty 2

## 2016-08-15 MED ORDER — TRAMADOL HCL 50 MG PO TABS
50.0000 mg | ORAL_TABLET | Freq: Four times a day (QID) | ORAL | Status: AC | PRN
  Administered 2016-08-15 – 2016-08-16 (×2): 50 mg via ORAL
  Filled 2016-08-15 (×2): qty 1

## 2016-08-15 MED ORDER — FUROSEMIDE 20 MG PO TABS
20.0000 mg | ORAL_TABLET | Freq: Every day | ORAL | Status: DC
  Administered 2016-08-16: 20 mg via ORAL
  Filled 2016-08-15: qty 1

## 2016-08-15 NOTE — Plan of Care (Signed)
Problem: Bowel/Gastric: Goal: Will not experience complications related to bowel motility Outcome: Progressing Pt has not had a stool this shift

## 2016-08-15 NOTE — Plan of Care (Signed)
Problem: Bowel/Gastric: Goal: Will not experience complications related to bowel motility Outcome: Progressing Pt able to have BM after being given Miralax

## 2016-08-15 NOTE — Progress Notes (Signed)
PROGRESS NOTE    [name]  NUU:[id number] DOB: 05/29/1935 DOA: 08/10/2016 PCP: Cassandria Anger, MD    Brief Narrative: [name] is a 81 y.o. female with history of atrial fibrillation, COPD presents to the ER with complaints of persistent nausea vomiting and right flank pain for the last 3 days. Denies any diarrhea fever or chills.   ED Course: In the ER CT scan of the abdomen shows right-sided ureter stone with hydronephrosis. Labs revealed acute renal failure. UA is not showing any features of UTI. Patient is afebrile. Since patient desiring persistent vomiting and pain patient will be admitted for further pain control and hydration. On call urologist Dr. Noah Delaine was consulted.  Patient was admitted with acute renal failure, secondary to obstructive uropathy, hydronephrosis and stone right ureter. Patient underwent stent placement with improvement of pain and renal function. Hospital course complicated by acute encephalopathy and acute hypoxic respiratory failure secondary to PNA and pulmonary edema , diastolic HF exacerbation. Her respiratory status has improved with IV lasix and Antibiotics.   Today patient report diarrhea.    Assessment & Plan:   Principal Problem:   Intractable nausea and vomiting Active Problems:   Essential hypertension   Atrial fibrillation (HCC)   EMPHYSEMA   ARF (acute renal failure) (HCC)   Urolithiasis   Abdominal pain   Kidney stone  1-Right side hydronephrosis Proximal 4 x 6 mm stone in the right ureter;  tylenol.  PRN nausea medications.  Urology consulted.  CT renal protocol; Moderate to marked right hydronephrosis and proximal hydroureter, secondary to a 4 x 6 mm stone in the proximal right ureter several cm past the right UPJ. UA negative for infection. Urine culture no growth.  Needs to follow up with urology for stent removal.   2-Acute Renal failure; obstructive uropathy.  Urology consulted.  Treated with IV fluids.    Improved cr 0.8.  Acute hypoxic Respiratory failure;  She has history of COPD. Lung sound diminished. She started to cough.  ABG negative for hypercapnia.  Chest x ray with infiltrates, pulmonary edema.  Improved with IV lasix, IV antibiotics. Nebulizer.  Change lasix to oral today.   Acute diastolic HF exacerbation.  IV Lasix BID.  Elevated BNP, Chest x ray with pulmonary edema.  Continue with lasix. Change to oral.   PNA, health care associated.  Leukocytosis, mild fever, chest x ray with basilar  air space diseases. Favor PNA.  Report productive cough.  Treated with IV vancomycin, zosyn for 3 days. Stop vanc and zosyn. Continue with azithromycin.  WBC decrease to 11 from 19   Acute encephalopathy, Hallucination; resolved.  Multifactorial, medication post anesthesia vs hypoxemia infection.  Less confuse.  CT head negative.   Diarrhea;  Start probiotics.  Monitor off vanco and zosyn.    HTN; PRN hydralazine.   Persistent A. Fib - Continue flecainide and metoprolol. Sinus rhythm.   COPD ; stable.   DVT prophylaxis: scd Code Status: full code.  Family Communication: care discussed with patient.  Disposition Plan: home at time of discharge   Consultants:   Urology    Procedures: none   Antimicrobials: none   Subjective: Back to baseline, alert and oriented.  dyspnea has improved.  Develops diarrhea, watery. She report history of diarrhea t home at times.     Objective: Vitals:   08/14/16 2033 08/15/16 0435 08/15/16 0607 08/15/16 0821  BP: (!) 157/47 (!) 170/58 (!) 153/55   Pulse: (!) 101 77  84  Resp: 18   16  Temp: 98.8 F (37.1 C) 98.9 F (37.2 C)    TempSrc: Oral Oral    SpO2: 91% 94%  96%  Weight:      Height:        Intake/Output Summary (Last 24 hours) at 08/15/16 1014 Last data filed at 08/15/16 1962  Gross per 24 hour  Intake             1360 ml  Output             3201 ml  Net            -1841 ml   Filed Weights   08/11/16  1333  Weight: 51.3 kg (113 lb)    Examination:  General exam: Alert, oriented. No acute distress.  Respiratory system: Normal respiratory effort, few crackles.  Cardiovascular system; S 1, S 2 RRR Gastrointestinal system: BS present, soft, nt Central nervous system: alert. Non focal.  Extremities: symmetric power.  Skin: No rashes, lesions or ulcers     Data Reviewed: I have personally reviewed following labs and imaging studies  CBC:  Recent Labs Lab 08/10/16 2025 08/11/16 0800 08/12/16 0719 08/13/16 1031 08/14/16 0541 08/15/16 0706  WBC 12.3* 14.1* 12.5* 19.4* 13.8* 11.5*  NEUTROABS 8.6* 11.2*  --   --   --   --   HGB 12.5 10.4* 9.5* 10.2* 9.8* 10.3*  HCT 38.9 32.4* 30.5* 32.6* 30.9* 32.1*  MCV 88.0 89.0 88.9 90.6 88.8 86.5  PLT 319 322 314 354 335 229   Basic Metabolic Panel:  Recent Labs Lab 08/11/16 0800 08/12/16 0719 08/13/16 1031 08/14/16 0541 08/15/16 0706  NA 142 142 141 141 138  K 4.8 4.1 4.2 3.5 3.3*  CL 110 114* 110 104 98*  CO2 21* 24 26 30  32  GLUCOSE 84 157* 91 87 90  BUN 28* 18 13 15 13   CREATININE 1.81* 0.84 0.75 0.84 0.82  CALCIUM 8.6* 8.2* 8.6* 8.3* 8.5*   GFR: Estimated Creatinine Clearance: 45.8 mL/min (by C-G formula based on SCr of 0.82 mg/dL). Liver Function Tests:  Recent Labs Lab 08/10/16 1532 08/10/16 2025 08/11/16 0800  AST 35 37 36  ALT 17 17 14   ALKPHOS 86 82 72  BILITOT 0.2 0.6 0.6  PROT 6.4 7.2 6.2*  ALBUMIN 4.0 3.9 3.4*    Recent Labs Lab 08/10/16 2025  LIPASE 34   No results for input(s): AMMONIA in the last 168 hours. Coagulation Profile: No results for input(s): INR, PROTIME in the last 168 hours. Cardiac Enzymes: No results for input(s): CKTOTAL, CKMB, CKMBINDEX, TROPONINI in the last 168 hours. BNP (last 3 results) No results for input(s): PROBNP in the last 8760 hours. HbA1C: No results for input(s): HGBA1C in the last 72 hours. CBG: No results for input(s): GLUCAP in the last 168  hours. Lipid Profile: No results for input(s): CHOL, HDL, LDLCALC, TRIG, CHOLHDL, LDLDIRECT in the last 72 hours. Thyroid Function Tests: No results for input(s): TSH, T4TOTAL, FREET4, T3FREE, THYROIDAB in the last 72 hours. Anemia Panel: No results for input(s): VITAMINB12, FOLATE, FERRITIN, TIBC, IRON, RETICCTPCT in the last 72 hours. Sepsis Labs: No results for input(s): PROCALCITON, LATICACIDVEN in the last 168 hours.  Recent Results (from the past 240 hour(s))  Urine Culture     Status: None   Collection Time: 08/10/16  7:15 PM  Result Value Ref Range Status   Specimen Description URINE, RANDOM  Final   Special Requests NONE  Final  Culture   Final    NO GROWTH Performed at [facility], [place] [address] [place],  [address]    Report Status 08/12/2016 FINAL  Final         Radiology Studies: Ct Head Wo Contrast  Result Date: 08/13/2016 CLINICAL DATA:  81 year old female with increasing confusion following admission for obstructive uropathy, acute renal failure. EXAM: CT HEAD WITHOUT CONTRAST TECHNIQUE: Contiguous axial images were obtained from the base of the skull through the vertex without intravenous contrast. COMPARISON:  Brain MRI 06/02/2008. FINDINGS: Brain: Cerebral volume is within normal limits for age. No midline shift, ventriculomegaly, mass effect, evidence of mass lesion, intracranial hemorrhage or evidence of cortically based acute infarction. Gray-white matter differentiation is within normal limits throughout the brain. Vascular: Calcified atherosclerosis at the skull base. No suspicious intracranial vascular hyperdensity. Skull: No acute osseous abnormality identified. Sinuses/Orbits: Visualized paranasal sinuses and mastoids are stable and well pneumatized. Other: No acute orbit or scalp soft tissue finding. IMPRESSION: Normal for age non contrast CT appearance of the brain. Electronically Signed   By: Genevie Ann M.D.   On: 08/13/2016 12:02         Scheduled Meds: . azithromycin  500 mg Oral Daily  . cholecalciferol  1,000 Units Oral Daily  . clonazePAM  0.5 mg Oral QHS  . cyanocobalamin  500 mcg Oral Daily  . flecainide  50 mg Oral BID  . furosemide  20 mg Oral Daily  . ipratropium-albuterol  3 mL Nebulization BID  . lactose free nutrition  237 mL Oral Q breakfast  . metoprolol tartrate  12.5 mg Oral BID  . pantoprazole  40 mg Oral Daily  . potassium chloride  40 mEq Oral Once  . saccharomyces boulardii  250 mg Oral BID   Continuous Infusions:    LOS: 2 days    Time spent: 35 minutes .     Elmarie Shiley, MD [facility] Pager [phone]  If 7PM-7AM, please contact night-coverage [email or web address] Password [email or web address] 08/15/2016, 10:14 AM

## 2016-08-15 NOTE — Telephone Encounter (Signed)
Pt care giver called regarding these medications, please resend to [facility] on [address]   Pt is currently in the hospital

## 2016-08-15 NOTE — Progress Notes (Signed)
Pt denies seeing anyone or talking to people that are not there. Pt A&O X 4

## 2016-08-15 NOTE — Care Management Note (Signed)
Case Management Note  Patient Details  Name: [name] MRN: [id number] Date of Birth: [birth year]  Subjective/Objective:   81 y/o f admitted w/intractable n/v. S/p hydronephrosis-stent. From home. PT-no f/u.                Action/Plan:d/c plan home.   Expected Discharge Date:  08/12/16               Expected Discharge Plan:  Home/Self Care  In-House Referral:     Discharge planning Services  CM Consult  Post Acute Care Choice:    Choice offered to:     DME Arranged:    DME Agency:     HH Arranged:    HH Agency:     Status of Service:  In process, will continue to follow  If discussed at Long Length of Stay Meetings, dates discussed:    Additional Comments:  Dessa Phi, RN 08/15/2016, 11:10 AM

## 2016-08-16 ENCOUNTER — Other Ambulatory Visit: Payer: Self-pay | Admitting: Urology

## 2016-08-16 ENCOUNTER — Inpatient Hospital Stay (HOSPITAL_COMMUNITY): Payer: Medicare Other

## 2016-08-16 DIAGNOSIS — I10 Essential (primary) hypertension: Secondary | ICD-10-CM

## 2016-08-16 DIAGNOSIS — R197 Diarrhea, unspecified: Secondary | ICD-10-CM

## 2016-08-16 DIAGNOSIS — R0902 Hypoxemia: Secondary | ICD-10-CM

## 2016-08-16 DIAGNOSIS — I480 Paroxysmal atrial fibrillation: Secondary | ICD-10-CM

## 2016-08-16 DIAGNOSIS — I351 Nonrheumatic aortic (valve) insufficiency: Secondary | ICD-10-CM

## 2016-08-16 LAB — ECHOCARDIOGRAM COMPLETE
E decel time: 211 msec
E/e' ratio: 14.87
FS: 29 % (ref 28–44)
Height: 63 in
IVS/LV PW RATIO, ED: 1.02
LA ID, A-P, ES: 36 mm
LA diam end sys: 36 mm
LA diam index: 2.37 cm/m2
LA vol A4C: 37.8 ml
LV E/e' medial: 14.87
LV E/e'average: 14.87
LV PW d: 11.4 mm — AB (ref 0.6–1.1)
LV e' LATERAL: 5.22 cm/s
LVOT SV: 65 mL
LVOT VTI: 20.8 cm
LVOT area: 3.14 cm2
LVOT diameter: 20 mm
LVOT peak vel: 97.5 cm/s
Lateral S' vel: 9.85 cm/s
MV Dec: 211
MV Peak grad: 2 mmHg
MV pk A vel: 80 m/s
MV pk E vel: 77.6 m/s
P 1/2 time: 372 ms
RV sys press: 38 mmHg
Reg peak vel: 297 cm/s
TAPSE: 16.5 mm
TDI e' lateral: 5.22
TDI e' medial: 4.57
TR max vel: 297 cm/s
Weight: 1808 oz

## 2016-08-16 LAB — BASIC METABOLIC PANEL
Anion gap: 8 (ref 5–15)
BUN: 14 mg/dL (ref 6–20)
CO2: 33 mmol/L — ABNORMAL HIGH (ref 22–32)
Calcium: 8.7 mg/dL — ABNORMAL LOW (ref 8.9–10.3)
Chloride: 99 mmol/L — ABNORMAL LOW (ref 101–111)
Creatinine, Ser: 0.78 mg/dL (ref 0.44–1.00)
GFR calc Af Amer: >60 mL/min (ref >60)
GFR calc non Af Amer: >60 mL/min (ref >60)
Glucose, Bld: 83 mg/dL (ref 65–99)
Potassium: 3.8 mmol/L (ref 3.5–5.1)
Sodium: 140 mmol/L (ref 135–145)

## 2016-08-16 LAB — CBC
HCT: 31 % — ABNORMAL LOW (ref 36.0–46.0)
Hemoglobin: 10 g/dL — ABNORMAL LOW (ref 12.0–15.0)
MCH: 28 pg (ref 26.0–34.0)
MCHC: 32.3 g/dL (ref 30.0–36.0)
MCV: 86.8 fL (ref 78.0–100.0)
Platelets: 358 10*3/uL (ref 150–400)
RBC: 3.57 MIL/uL — ABNORMAL LOW (ref 3.87–5.11)
RDW: 16.4 % — ABNORMAL HIGH (ref 11.5–15.5)
WBC: 9.2 10*3/uL (ref 4.0–10.5)

## 2016-08-16 LAB — C DIFFICILE QUICK SCREEN W PCR REFLEX
C Diff antigen: NEGATIVE
C Diff interpretation: NOT DETECTED
C Diff toxin: NEGATIVE

## 2016-08-16 MED ORDER — LOPERAMIDE HCL 2 MG PO CAPS
2.0000 mg | ORAL_CAPSULE | Freq: Three times a day (TID) | ORAL | Status: DC | PRN
  Administered 2016-08-16: 2 mg via ORAL
  Filled 2016-08-16: qty 1

## 2016-08-16 MED ORDER — ENOXAPARIN SODIUM 40 MG/0.4ML ~~LOC~~ SOLN
40.0000 mg | SUBCUTANEOUS | Status: DC
Start: 2016-08-16 — End: 2016-08-17
  Filled 2016-08-16: qty 0.4

## 2016-08-16 NOTE — Progress Notes (Signed)
PROGRESS NOTE  [name] WPY:[id number] DOB: December 02, 1935 DOA: 08/10/2016 PCP: Cassandria Anger, MD   LOS: 3 days   Brief Narrative / Interim history: [name] a 81 y.o.femalewith history of atrial fibrillation, COPD presents to the ER with complaints of persistent nausea vomiting and right flank pain for the last 3 days. Denies any diarrhea fever or chills.She was found to have a right-sided ureter stone with hydronephrosis on the CT scan on admission, with acute renal failure.  Urology was consulted and have evaluated patient, she underwent stent placement with improvement of pain in her renal function.  Hospital course was complicated by acute encephalopathy and acute hypoxic respiratory failure due to pneumonia and pulmonary edema requiring IV antibiotics and Lasix.  Assessment & Plan: Principal Problem:   Intractable nausea and vomiting Active Problems:   Essential hypertension   Atrial fibrillation (HCC)   EMPHYSEMA   ARF (acute renal failure) (HCC)   Urolithiasis   Abdominal pain   Kidney stone   Right-sided hydronephrosis with stone -Urology consulted, status post stent placement on 6/8 -She is stable, will need follow-up with urology as an outpatient for stent removal  Acute renal failure in the setting of obstructive uropathy -Improved status post decompression, creatinine is now within normal limits.  Acute hypoxic respiratory failure due to pulmonary edema and probable pneumonia -Has a history of COPD, chest x-ray with pulmonary edema and potential infiltrates -She was diuresed with IV Lasix, she is net -2.4 L, currently on p.o. Lasix, continue -Continue azithromycin  Acute encephalopathy -Postanesthesia, resolved  Acute diastolic CHF -We will update a 2D echo  Diarrhea -We will obtain a C. difficile, pending. -Continue probiotics -If infectious etiology is negative, will start loperamide.  She mentions that she been having intermittent diarrhea  even at home  Hypertension -Continue current regimen, blood pressure between 120s and 150s  Paroxysmal A. fib -Currently in sinus, continue flecainide, metoprolol -CHADS VASC score > 2, it was noted in cardiology notes in office that patient is refusing Coumadin.  Continue aspirin  COPD -No wheezing, appears stable   DVT prophylaxis: Lovenox Code Status: Full code Family Communication: no family at bedside Disposition Plan: Home when ready  Consultants:   Urology   Procedures:   2D echo: pending  Antimicrobials:  Vancomycin 6/10 >> 6/12  Zosyn 6/10 >> 6/12  Azithromycin 6/12 >>  Subjective: -Complains of diarrhea, no chest pain no shortness of breath.  Objective: Vitals:   08/15/16 1529 08/15/16 2010 08/16/16 0059 08/16/16 0525  BP: 140/62 (!) 169/60 (!) 121/41 (!) 154/55  Pulse: 81 82  69  Resp: 17 17    Temp: 98.2 F (36.8 C) 99.3 F (37.4 C) 99.3 F (37.4 C) 97.8 F (36.6 C)  TempSrc: Oral Oral Oral Oral  SpO2: 95% 97%  97%  Weight:      Height:        Intake/Output Summary (Last 24 hours) at 08/16/16 1107 Last data filed at 08/16/16 0940  Gross per 24 hour  Intake              360 ml  Output             1600 ml  Net            -1240 ml   Filed Weights   08/11/16 1333  Weight: 51.3 kg (113 lb)    Examination:  Vitals:   08/15/16 1529 08/15/16 2010 08/16/16 0059 08/16/16 0525  BP: 140/62 (!) 169/60 Marland Kitchen)  121/41 (!) 154/55  Pulse: 81 82  69  Resp: 17 17    Temp: 98.2 F (36.8 C) 99.3 F (37.4 C) 99.3 F (37.4 C) 97.8 F (36.6 C)  TempSrc: Oral Oral Oral Oral  SpO2: 95% 97%  97%  Weight:      Height:        Constitutional: NAD Eyes: lids and conjunctivae normal ENMT: Mucous membranes are moist Respiratory: clear to auscultation bilaterally, no wheezing, no crackles. Normal respiratory effort.  Cardiovascular: Regular rate and rhythm, no murmurs / rubs / gallops. No LE edema. 2+ pedal pulses. No carotid bruits.  Abdomen: no  tenderness. Bowel sounds positive.  Skin: no rashes Neurologic: non focal    Data Reviewed: I have independently reviewed following labs and imaging studies  CBC:  Recent Labs Lab 08/10/16 2025 08/11/16 0800 08/12/16 0719 08/13/16 1031 08/14/16 0541 08/15/16 0706 08/16/16 0736  WBC 12.3* 14.1* 12.5* 19.4* 13.8* 11.5* 9.2  NEUTROABS 8.6* 11.2*  --   --   --   --   --   HGB 12.5 10.4* 9.5* 10.2* 9.8* 10.3* 10.0*  HCT 38.9 32.4* 30.5* 32.6* 30.9* 32.1* 31.0*  MCV 88.0 89.0 88.9 90.6 88.8 86.5 86.8  PLT 319 322 314 354 335 358 662   Basic Metabolic Panel:  Recent Labs Lab 08/12/16 0719 08/13/16 1031 08/14/16 0541 08/15/16 0706 08/16/16 0736  NA 142 141 141 138 140  K 4.1 4.2 3.5 3.3* 3.8  CL 114* 110 104 98* 99*  CO2 24 26 30  32 33*  GLUCOSE 157* 91 87 90 83  BUN 18 13 15 13 14   CREATININE 0.84 0.75 0.84 0.82 0.78  CALCIUM 8.2* 8.6* 8.3* 8.5* 8.7*   GFR: Estimated Creatinine Clearance: 46.9 mL/min (by C-G formula based on SCr of 0.78 mg/dL). Liver Function Tests:  Recent Labs Lab 08/10/16 1532 08/10/16 2025 08/11/16 0800  AST 35 37 36  ALT 17 17 14   ALKPHOS 86 82 72  BILITOT 0.2 0.6 0.6  PROT 6.4 7.2 6.2*  ALBUMIN 4.0 3.9 3.4*    Recent Labs Lab 08/10/16 2025  LIPASE 34   No results for input(s): AMMONIA in the last 168 hours. Coagulation Profile: No results for input(s): INR, PROTIME in the last 168 hours. Cardiac Enzymes: No results for input(s): CKTOTAL, CKMB, CKMBINDEX, TROPONINI in the last 168 hours. BNP (last 3 results) No results for input(s): PROBNP in the last 8760 hours. HbA1C: No results for input(s): HGBA1C in the last 72 hours. CBG: No results for input(s): GLUCAP in the last 168 hours. Lipid Profile: No results for input(s): CHOL, HDL, LDLCALC, TRIG, CHOLHDL, LDLDIRECT in the last 72 hours. Thyroid Function Tests: No results for input(s): TSH, T4TOTAL, FREET4, T3FREE, THYROIDAB in the last 72 hours. Anemia Panel: No results  for input(s): VITAMINB12, FOLATE, FERRITIN, TIBC, IRON, RETICCTPCT in the last 72 hours. Urine analysis:    Component Value Date/Time   COLORURINE YELLOW 08/10/2016 1915   APPEARANCEUR CLEAR 08/10/2016 1915   LABSPEC 1.019 08/10/2016 1915   PHURINE 5.0 08/10/2016 1915   GLUCOSEU NEGATIVE 08/10/2016 1915   GLUCOSEU NEGATIVE 03/20/2014 1144   HGBUR NEGATIVE 08/10/2016 1915   BILIRUBINUR NEGATIVE 08/10/2016 1915   BILIRUBINUR negative 08/10/2016 1538   KETONESUR 5 (A) 08/10/2016 1915   PROTEINUR NEGATIVE 08/10/2016 1915   UROBILINOGEN 0.2 08/10/2016 1538   UROBILINOGEN 0.2 03/20/2014 1144   NITRITE NEGATIVE 08/10/2016 1915   LEUKOCYTESUR NEGATIVE 08/10/2016 1915   Sepsis Labs: Invalid input(s): PROCALCITONIN, LACTICIDVEN  Recent Results (from the past 240 hour(s))  Urine Culture     Status: None   Collection Time: 08/10/16  7:15 PM  Result Value Ref Range Status   Specimen Description URINE, RANDOM  Final   Special Requests NONE  Final   Culture   Final    NO GROWTH Performed at [facility], [address] [place], [place] [address]    Report Status 08/12/2016 FINAL  Final  MRSA PCR Screening     Status: None   Collection Time: 08/15/16 10:14 AM  Result Value Ref Range Status   MRSA by PCR NEGATIVE NEGATIVE Final    Comment:        The GeneXpert MRSA Assay (FDA approved for NASAL specimens only), is one component of a comprehensive MRSA colonization surveillance program. It is not intended to diagnose MRSA infection nor to guide or monitor treatment for MRSA infections.       Radiology Studies: No results found.   Scheduled Meds: . azithromycin  500 mg Oral Daily  . cholecalciferol  1,000 Units Oral Daily  . clonazePAM  0.5 mg Oral QHS  . cyanocobalamin  500 mcg Oral Daily  . flecainide  50 mg Oral BID  . furosemide  20 mg Oral Daily  . lactose free nutrition  237 mL Oral Q breakfast  . metoprolol tartrate  12.5 mg Oral BID  . pantoprazole  40 mg  Oral Daily  . saccharomyces boulardii  250 mg Oral BID   Continuous Infusions:  Marzetta Board, MD, PhD [facility] Pager [phone]  If 7PM-7AM, please contact night-coverage [email or web address] Password [email or web address] 08/16/2016, 11:07 AM

## 2016-08-16 NOTE — Telephone Encounter (Signed)
Patient sister called in stating that pharmacy still does not have medication for patient.  Please  Follow back up with pharmacy ([facility] [address]) and follow back up with sister, [name] at [phone]

## 2016-08-16 NOTE — Telephone Encounter (Signed)
RX re-called in, rx was written on 08/10/06.

## 2016-08-16 NOTE — Progress Notes (Signed)
  Echocardiogram 2D Echocardiogram has been performed.  Kerrin Markman [facility] [place] 08/16/2016, 12:17 PM

## 2016-08-17 DIAGNOSIS — N179 Acute kidney failure, unspecified: Secondary | ICD-10-CM

## 2016-08-17 DIAGNOSIS — N1330 Unspecified hydronephrosis: Secondary | ICD-10-CM

## 2016-08-17 DIAGNOSIS — J438 Other emphysema: Secondary | ICD-10-CM

## 2016-08-17 DIAGNOSIS — N200 Calculus of kidney: Secondary | ICD-10-CM

## 2016-08-17 DIAGNOSIS — R112 Nausea with vomiting, unspecified: Secondary | ICD-10-CM

## 2016-08-17 LAB — CBC
HCT: 32.1 % — ABNORMAL LOW (ref 36.0–46.0)
Hemoglobin: 10.3 g/dL — ABNORMAL LOW (ref 12.0–15.0)
MCH: 27.7 pg (ref 26.0–34.0)
MCHC: 32.1 g/dL (ref 30.0–36.0)
MCV: 86.3 fL (ref 78.0–100.0)
Platelets: 396 10*3/uL (ref 150–400)
RBC: 3.72 MIL/uL — ABNORMAL LOW (ref 3.87–5.11)
RDW: 16.2 % — ABNORMAL HIGH (ref 11.5–15.5)
WBC: 9.5 10*3/uL (ref 4.0–10.5)

## 2016-08-17 LAB — BASIC METABOLIC PANEL
Anion gap: 7 (ref 5–15)
BUN: 11 mg/dL (ref 6–20)
CO2: 32 mmol/L (ref 22–32)
Calcium: 8.8 mg/dL — ABNORMAL LOW (ref 8.9–10.3)
Chloride: 99 mmol/L — ABNORMAL LOW (ref 101–111)
Creatinine, Ser: 0.7 mg/dL (ref 0.44–1.00)
GFR calc Af Amer: >60 mL/min (ref >60)
GFR calc non Af Amer: >60 mL/min (ref >60)
Glucose, Bld: 83 mg/dL (ref 65–99)
Potassium: 3.8 mmol/L (ref 3.5–5.1)
Sodium: 138 mmol/L (ref 135–145)

## 2016-08-17 MED ORDER — LOPERAMIDE HCL 2 MG PO CAPS: 2.0000 mg | ORAL_CAPSULE | Freq: Three times a day (TID) | ORAL | 0 refills | Status: DC | PRN

## 2016-08-17 MED ORDER — ALBUTEROL SULFATE HFA 108 (90 BASE) MCG/ACT IN AERS: 2.0000 | INHALATION_SPRAY | Freq: Four times a day (QID) | RESPIRATORY_TRACT | 2 refills | Status: DC | PRN

## 2016-08-17 MED ORDER — AZITHROMYCIN 250 MG PO TABS: ORAL_TABLET | ORAL | 0 refills | Status: DC

## 2016-08-17 MED ORDER — FUROSEMIDE 10 MG/ML IJ SOLN
20.0000 mg | Freq: Once | INTRAMUSCULAR | Status: AC
  Administered 2016-08-17: 20 mg via INTRAVENOUS
  Filled 2016-08-17: qty 2

## 2016-08-17 MED ORDER — MAGIC MOUTHWASH W/LIDOCAINE: 5.0000 mL | Freq: Three times a day (TID) | ORAL | 0 refills | Status: DC | PRN

## 2016-08-17 MED ORDER — MAGIC MOUTHWASH W/LIDOCAINE
5.0000 mL | Freq: Four times a day (QID) | ORAL | Status: DC
  Administered 2016-08-17: 5 mL via ORAL
  Filled 2016-08-17 (×2): qty 5

## 2016-08-17 MED ORDER — SACCHAROMYCES BOULARDII 250 MG PO CAPS: 250.0000 mg | ORAL_CAPSULE | Freq: Two times a day (BID) | ORAL | 0 refills | Status: DC

## 2016-08-17 NOTE — Care Management Note (Signed)
Case Management Note  Patient Details  Name: [name] MRN: [id number] Date of Birth: [birth year]  Subjective/Objective: Awaiting documented 02 sats-[facility] notified. [facility] rep aware of qualifying for home 02 & home 02 order-will deliver to rm prior d/c.                   Action/Plan:d/c home w/home 02   Expected Discharge Date:  08/17/16               Expected Discharge Plan:  Home/Self Care  In-House Referral:     Discharge planning Services  CM Consult  Post Acute Care Choice:    Choice offered to:  Patient  DME Arranged:  [facility] DME Agency:  [place]:    [facility] Agency:     Status of Service:  Completed, signed off  If discussed at [place] of Stay Meetings, dates discussed:    Additional Comments:  Dessa Phi, RN 08/17/2016, 1:28 PM

## 2016-08-17 NOTE — Discharge Summary (Signed)
Physician Discharge Summary  [name] AGT:[id number] DOB: 16-Mar-1935 DOA: 08/10/2016  PCP: Cassandria Anger, MD  Admit date: 08/10/2016 Discharge date: 08/17/2016  Admitted From: home Disposition:  home  Recommendations for Outpatient Follow-up:  1. Follow up with PCP in 1-2 weeks 2. Antibiotics for 2 more days at home 3. Oxygen with ambulation at home  Home Health: none  Equipment/Devices: O2  Discharge Condition: stable CODE STATUS: Full code Diet recommendation: regular  HPI: Per Dr. Glyn Ade, [name] is a 81 y.o. female with history of atrial fibrillation, COPD presents to the ER with complaints of persistent nausea vomiting and right flank pain for the last 3 days. Denies any diarrhea fever or chills. ED Course: In the ER CT scan of the abdomen shows right-sided ureter stone with hydronephrosis. Labs revealed acute renal failure. UA is not showing any features of UTI. Patient is afebrile. Since patient desiring persistent vomiting and pain patient will be admitted for further pain control and hydration. On call urologist Dr. Noah Delaine was consulted.  Hospital Course: Discharge Diagnoses:  Principal Problem:   Intractable nausea and vomiting Active Problems:   Essential hypertension   Atrial fibrillation (HCC)   EMPHYSEMA   ARF (acute renal failure) (HCC)   Urolithiasis   Abdominal pain   Kidney stone   Right-sided hydronephrosis with stone -Urology consulted, status post stent placement on 6/8. She is stable, will need follow-up with urology as an outpatient for stent removal in few weeks, information given to patient. Acute renal failure in the setting of obstructive uropathy -Improved status post decompression, creatinine is now within normal limits. Acute hypoxic respiratory failure due to pulmonary edema and probable pneumonia -Has a history of COPD, chest x-ray with pulmonary edema likely due to initial fluid resuscitation. She received IV Lasix x 3  doses and now is euvolemic and net negative 3.6 L.She was placed empirically on Azithromycin which will continue for 2 additional days at home. Her oxygen levels were 88% with ambulation - while patient asymptomatic --. She has known COPD and suspect she has a chronic component of borderline oxygen levels with ambulation. Recommend oxygen at home with activities. She has mild pulmonary hypertension probably in the setting of COPD. Acute encephalopathy -Postanesthesia, resolved Acute on chronic diastolic CHF -repeat 2D echo as below, normal EF and grade 1 DD similar to prior echo in 2010.  Diarrhea -states chronic and related to food / eating, suspect underlying IBS. For completeness checked a C diff which was negative. Imodium prn, may need referral to GI if persistent  Hypertension -Continue current regimen Paroxysmal A. Fib -Currently in sinus, continue flecainide, metoprolol, CHADS VASC score > 2, it was noted in cardiology notes in office that patient is refusing Coumadin.  Continue aspirin COPD -No wheezing, appears stable. Added Albuterol   Discharge Instructions  Discharge Instructions    Diet - low sodium heart healthy    Complete by:  As directed    Increase activity slowly    Complete by:  As directed      Allergies as of 08/17/2016      Reactions   Amoxicillin    "makes me crazy"   Codeine    REACTION: Nausea   Levaquin [levofloxacin In D5w]    n/v   Other    Pt does not want to take any pain meds      Medication List    STOP taking these medications   LORazepam 1 MG tablet Commonly known as:  ATIVAN     TAKE these medications   acetaminophen 325 MG tablet Commonly known as:  TYLENOL Take 2 tablets (650 mg total) by mouth every 6 (six) hours as needed for mild pain (or Fever >/= 101).   albuterol 108 (90 Base) MCG/ACT inhaler Commonly known as:  PROVENTIL HFA;VENTOLIN HFA Inhale 2 puffs into the lungs every 6 (six) hours as needed for wheezing or shortness of  breath.   aspirin 81 MG chewable tablet Chew 81 mg by mouth 2 (two) times daily.   azithromycin 250 MG tablet Commonly known as:  ZITHROMAX Take for 2 more days Start taking on:  08/18/2016   clonazePAM 0.5 MG tablet Commonly known as:  KLONOPIN TAKE 1 OR 2 TABLETS AT BEDTIME   [employer] VITAMIN D3 1000 units capsule Generic drug:  Cholecalciferol Take 1,000 Units by mouth daily.   cyanocobalamin 500 MCG tablet Commonly known as:  V-R VITAMIN B-12 Take 1 tablet (500 mcg total) by mouth daily.   flecainide 50 MG tablet Commonly known as:  TAMBOCOR TAKE 1 TABLET BY MOUTH TWICE DAILY   lactose free nutrition Liqd Take 237 mLs by mouth daily with breakfast.   loperamide 2 MG capsule Commonly known as:  IMODIUM Take 1 capsule (2 mg total) by mouth 3 (three) times daily as needed for diarrhea or loose stools.   magic mouthwash w/lidocaine Soln Take 5 mLs by mouth 3 (three) times daily as needed for mouth pain.   metoprolol tartrate 25 MG tablet Commonly known as:  LOPRESSOR Take 0.5 tablets (12.5 mg total) by mouth 2 (two) times daily.   omeprazole 20 MG capsule Commonly known as:  PRILOSEC Take 20 mg by mouth daily.   saccharomyces boulardii 250 MG capsule Commonly known as:  FLORASTOR Take 1 capsule (250 mg total) by mouth 2 (two) times daily.   umeclidinium-vilanterol 62.5-25 MCG/INH Aepb Commonly known as:  ANORO ELLIPTA Inhale 1 puff into the lungs daily.            Durable Medical Equipment        Start     Ordered   08/17/16 1248  For home use only DME oxygen  Once    Question Answer Comment  Mode or (Route) Nasal cannula   Liters per Minute 2   Frequency Continuous (stationary and portable oxygen unit needed)   Oxygen conserving device Yes   Oxygen delivery system Gas      08/17/16 1247     Follow-up Information    McKenzie, Candee Furbish, MD Follow up in 2 week(s).   Specialty:  Urology Contact information: [place]  [id number] [phone]        Plotnikov, Evie Lacks, MD. Schedule an appointment as soon as possible for a visit in 1 week(s).   Specialty:  Internal Medicine Contact information: [place] [address] [phone]        [facility] - Dme Follow up.   Why:  home oxygen Contact information: [place] [address] [phone]          Allergies  Allergen Reactions  . Amoxicillin     "makes me crazy"  . Codeine     REACTION: Nausea  . Levaquin [Levofloxacin In D5w]     n/v  . Other     Pt does not want to take any pain meds    Consultations:  None   Procedures/Studies:  2D echo  Study  Conclusions - Left ventricle: The cavity size was normal. There was mild concentric hypertrophy. Systolic function was normal. The estimated ejection fraction was in the range of 55% to 60%. Wall motion was normal; there were no regional wall motion abnormalities. Doppler parameters are consistent with abnormal left ventricular relaxation (grade 1 diastolic dysfunction). Doppler parameters are consistent with high ventricular filling pressure. - Aortic valve: Transvalvular velocity was within the normal range. There was no stenosis. There was moderate regurgitation. - Mitral valve: Transvalvular velocity was within the normal range. There was no evidence for stenosis. There was no regurgitation. - Left atrium: The atrium was mildly dilated. - Right ventricle: The cavity size was normal. Wall thickness was normal. Systolic function was normal. - Right atrium: The atrium was moderately dilated. - Pulmonary arteries: Systolic pressure was mildly increased. PA peak pressure: 43 mm Hg (S). - Pericardium, extracardiac: A trivial pericardial effusion was identified.  Dg Abd 1 View  Result Date: 08/11/2016 CLINICAL DATA:  Cystoscopy and right ureteral stent placement for a right ureteral calculus with hydronephrosis. EXAM: ABDOMEN - 1 VIEW  COMPARISON:  Abdomen and pelvis CT dated 08/10/2016. FINDINGS: Twelve C-arm views of the right pelvis and mid abdomen were obtained. These demonstrate retrograde injection of contrast in the rib right ureter. The ureter is normal in caliber and there is mild to moderate dilatation of the right renal collecting system. There is an interval oval filling defect in the upper pole collecting system on the right, otherwise without visualization of the previously seen calculus in the right ureter. There is subsequent placement of a right ureteral stent in satisfactory position. IMPRESSION: 1. Satisfactory placement of a right ureteral stent. 2. The previously demonstrated right ureteral calculus has been displaced into the upper pole collecting system on the right. 3. Improved hydronephrosis on the right. Electronically Signed   By: Claudie Revering M.D.   On: 08/11/2016 15:50   Ct Head Wo Contrast  Result Date: 08/13/2016 CLINICAL DATA:  82 year old female with increasing confusion following admission for obstructive uropathy, acute renal failure. EXAM: CT HEAD WITHOUT CONTRAST TECHNIQUE: Contiguous axial images were obtained from the base of the skull through the vertex without intravenous contrast. COMPARISON:  Brain MRI 06/02/2008. FINDINGS: Brain: Cerebral volume is within normal limits for age. No midline shift, ventriculomegaly, mass effect, evidence of mass lesion, intracranial hemorrhage or evidence of cortically based acute infarction. Gray-white matter differentiation is within normal limits throughout the brain. Vascular: Calcified atherosclerosis at the skull base. No suspicious intracranial vascular hyperdensity. Skull: No acute osseous abnormality identified. Sinuses/Orbits: Visualized paranasal sinuses and mastoids are stable and well pneumatized. Other: No acute orbit or scalp soft tissue finding. IMPRESSION: Normal for age non contrast CT appearance of the brain. Electronically Signed   By: Genevie Ann M.D.    On: 08/13/2016 12:02   Dg Chest Port 1 View  Result Date: 08/13/2016 CLINICAL DATA:  Congestion and nausea today. EXAM: PORTABLE CHEST 1 VIEW COMPARISON:  Single-view of the chest 08/10/2016. PA and lateral chest 04/03/2016. FINDINGS: There is cardiomegaly. Aortic atherosclerosis is noted. The patient has new bilateral pleural effusions and basilar airspace disease. The lungs appear emphysematous scratch the lungs are emphysematous. IMPRESSION: New small to moderate bilateral pleural effusions and basilar airspace disease, likely atelectasis. Cardiomegaly and interstitial pulmonary edema. Emphysema. Atherosclerosis. Electronically Signed   By: Inge Rise M.D.   On: 08/13/2016 10:17   Dg Abd Acute W/chest  Result Date: 08/10/2016 CLINICAL DATA:  Abdominal pain EXAM: DG ABDOMEN  ACUTE W/ 1V CHEST COMPARISON:  Chest radiograph April 03, 2016 FINDINGS: PA chest: There is stable apical pleural thickening bilaterally. There is no edema or consolidation. Heart is borderline enlarged with pulmonary vascularity within normal limits. There is extensive aortic atherosclerotic calcification. No adenopathy. Supine and upright abdomen: There is no bowel dilatation or air-fluid level to suggest bowel obstruction. No free air. There are surgical clips in the left upper quadrant. There is aortoiliac atherosclerosis. There is thoracolumbar dextroscoliosis. IMPRESSION: No bowel obstruction or free air. Surgical clips left upper quadrant. Apical pleural thickening bilaterally, stable. No edema or consolidation. Stable cardiac silhouette. There is aortoiliac atherosclerosis. Electronically Signed   By: Lowella Grip III M.D.   On: 08/10/2016 16:09   Dg C-arm 1-60 Min-no Report  Result Date: 08/11/2016 Fluoroscopy was utilized by the requesting physician.  No radiographic interpretation.   Ct Renal Stone Study  Result Date: 08/10/2016 CLINICAL DATA:  Right sided abdominal pain with nausea and vomiting for 2 days  EXAM: CT ABDOMEN AND PELVIS WITHOUT CONTRAST TECHNIQUE: Multidetector CT imaging of the abdomen and pelvis was performed following the standard protocol without IV contrast. COMPARISON:  08/10/2016 radiograph, CT 01/30/2005 FINDINGS: Lower chest: Mild emphysema at the lung bases with scarring at the lingula and left base. No acute consolidation or effusion. There is moderate cardiomegaly. Densely calcified distal thoracic aorta. Hepatobiliary: No focal liver abnormality is seen. No gallstones, gallbladder wall thickening, or biliary dilatation. Pancreas: Unremarkable. No pancreatic ductal dilatation or surrounding inflammatory changes. Spleen: Status post splenectomy. Probable splenule in the left upper quadrant. Adjacent dystrophic calcification. Adrenals/Urinary Tract: Adrenal glands are within normal limits. 2 mm stone upper pole left kidney. Enlarged right kidney with moderate hydronephrosis and proximal hydroureter. This is secondary a 4 mm AP x 6 mm craniocaudad stone in the proximal right ureter, several cm past the right UPJ. Urinary bladder is empty Stomach/Bowel: Stomach nonenlarged. No dilated small bowel. No colon wall thickening. Colon diverticular disease without acute inflammation. Suspect prior appendectomy. Vascular/Lymphatic: Densely calcified aorta and branch vessels. No aneurysmal dilatation. No significantly enlarged lymph nodes. Reproductive: Status post hysterectomy. No adnexal masses. Other: Small free fluid in the pelvis.  No free air. Musculoskeletal: Heterogenous attenuation of the vertebral bodies with mixed sclerotic and lucent lesions. No acute osseous abnormality. IMPRESSION: 1. Moderate to marked right hydronephrosis and proximal hydroureter, secondary to a 4 x 6 mm stone in the proximal right ureter several cm past the right UPJ. 2. Small amount of free fluid in the pelvis 3. Extensive atherosclerotic calcific disease of the aorta 4. Cardiomegaly 5. Status post splenectomy with  probable splenule in the left upper quadrant 6. Heterogeneous attenuation of the vertebral bodies, could relate to osteopenia although marrow infiltrative process is also a consideration. Suggest correlation with nonemergent MRI. Electronically Signed   By: Donavan Foil M.D.   On: 08/10/2016 19:43     Subjective: - no chest pain, shortness of breath, no abdominal pain, nausea or vomiting.   Discharge Exam: Vitals:   08/17/16 0434 08/17/16 1411  BP: (!) 151/47 (!) 142/60  Pulse: 74 76  Resp: 20 20  Temp: 98.2 F (36.8 C) 98.5 F (36.9 C)   Vitals:   08/16/16 2111 08/17/16 0434 08/17/16 1235 08/17/16 1411  BP: (!) 130/51 (!) 151/47  (!) 142/60  Pulse: 83 74  76  Resp:  20  20  Temp: 98 F (36.7 C) 98.2 F (36.8 C)  98.5 F (36.9 C)  TempSrc: Oral Oral  Oral  SpO2: 93% 95% (!) 88% 91%  Weight:  50.7 kg (111 lb 11.2 oz)    Height:        General: Pt is alert, awake, not in acute distress Cardiovascular: RRR, S1/S2 +, no rubs, no gallops Respiratory: CTA bilaterally, no wheezing, no rhonchi Abdominal: Soft, NT, ND, bowel sounds + Extremities: no edema, no cyanosis    The results of significant diagnostics from this hospitalization (including imaging, microbiology, ancillary and laboratory) are listed below for reference.     Microbiology: Recent Results (from the past 240 hour(s))  Urine Culture     Status: None   Collection Time: 08/10/16  7:15 PM  Result Value Ref Range Status   Specimen Description URINE, RANDOM  Final   Special Requests NONE  Final   Culture   Final    NO GROWTH Performed at [facility], [address] [place],  [address]    Report Status 08/12/2016 FINAL  Final  MRSA PCR Screening     Status: None   Collection Time: 08/15/16 10:14 AM  Result Value Ref Range Status   MRSA by PCR NEGATIVE NEGATIVE Final    Comment:        The GeneXpert MRSA Assay (FDA approved for NASAL specimens only), is one component of a comprehensive  MRSA colonization surveillance program. It is not intended to diagnose MRSA infection nor to guide or monitor treatment for MRSA infections.   C difficile quick scan w PCR reflex     Status: None   Collection Time: 08/16/16  8:53 AM  Result Value Ref Range Status   C Diff antigen NEGATIVE NEGATIVE Final   C Diff toxin NEGATIVE NEGATIVE Final   C Diff interpretation No C. difficile detected.  Final     Labs: BNP (last 3 results)  Recent Labs  08/13/16 1031  BNP 2,694.8*   Basic Metabolic Panel:  Recent Labs Lab 08/13/16 1031 08/14/16 0541 08/15/16 0706 08/16/16 0736 08/17/16 0707  NA 141 141 138 140 138  K 4.2 3.5 3.3* 3.8 3.8  CL 110 104 98* 99* 99*  CO2 26 30 32 33* 32  GLUCOSE 91 87 90 83 83  BUN 13 15 13 14 11   CREATININE 0.75 0.84 0.82 0.78 0.70  CALCIUM 8.6* 8.3* 8.5* 8.7* 8.8*   Liver Function Tests:  Recent Labs Lab 08/10/16 1532 08/10/16 2025 08/11/16 0800  AST 35 37 36  ALT 17 17 14   ALKPHOS 86 82 72  BILITOT 0.2 0.6 0.6  PROT 6.4 7.2 6.2*  ALBUMIN 4.0 3.9 3.4*    Recent Labs Lab 08/10/16 2025  LIPASE 34   No results for input(s): AMMONIA in the last 168 hours. CBC:  Recent Labs Lab 08/10/16 2025 08/11/16 0800  08/13/16 1031 08/14/16 0541 08/15/16 0706 08/16/16 0736 08/17/16 0707  WBC 12.3* 14.1*  < > 19.4* 13.8* 11.5* 9.2 9.5  NEUTROABS 8.6* 11.2*  --   --   --   --   --   --   HGB 12.5 10.4*  < > 10.2* 9.8* 10.3* 10.0* 10.3*  HCT 38.9 32.4*  < > 32.6* 30.9* 32.1* 31.0* 32.1*  MCV 88.0 89.0  < > 90.6 88.8 86.5 86.8 86.3  PLT 319 322  < > 354 335 358 358 396  < > = values in this interval not displayed. Cardiac Enzymes: No results for input(s): CKTOTAL, CKMB, CKMBINDEX, TROPONINI in the last 168 hours. BNP: Invalid input(s): POCBNP CBG: No results for input(s):  GLUCAP in the last 168 hours. D-Dimer No results for input(s): DDIMER in the last 72 hours. Hgb A1c No results for input(s): HGBA1C in the last 72 hours. Lipid  Profile No results for input(s): CHOL, HDL, LDLCALC, TRIG, CHOLHDL, LDLDIRECT in the last 72 hours. Thyroid function studies No results for input(s): TSH, T4TOTAL, T3FREE, THYROIDAB in the last 72 hours.  Invalid input(s): FREET3 Anemia work up No results for input(s): VITAMINB12, FOLATE, FERRITIN, TIBC, IRON, RETICCTPCT in the last 72 hours. Urinalysis    Component Value Date/Time   COLORURINE YELLOW 08/10/2016 1915   APPEARANCEUR CLEAR 08/10/2016 1915   LABSPEC 1.019 08/10/2016 1915   PHURINE 5.0 08/10/2016 1915   GLUCOSEU NEGATIVE 08/10/2016 1915   GLUCOSEU NEGATIVE 03/20/2014 1144   HGBUR NEGATIVE 08/10/2016 1915   BILIRUBINUR NEGATIVE 08/10/2016 1915   BILIRUBINUR negative 08/10/2016 1538   KETONESUR 5 (A) 08/10/2016 1915   PROTEINUR NEGATIVE 08/10/2016 1915   UROBILINOGEN 0.2 08/10/2016 1538   UROBILINOGEN 0.2 03/20/2014 1144   NITRITE NEGATIVE 08/10/2016 1915   LEUKOCYTESUR NEGATIVE 08/10/2016 1915   Sepsis Labs Invalid input(s): PROCALCITONIN,  WBC,  LACTICIDVEN Microbiology Recent Results (from the past 240 hour(s))  Urine Culture     Status: None   Collection Time: 08/10/16  7:15 PM  Result Value Ref Range Status   Specimen Description URINE, RANDOM  Final   Special Requests NONE  Final   Culture   Final    NO GROWTH Performed at [facility], [place] [address] [place], [place] [address]    Report Status 08/12/2016 FINAL  Final  MRSA PCR Screening     Status: None   Collection Time: 08/15/16 10:14 AM  Result Value Ref Range Status   MRSA by PCR NEGATIVE NEGATIVE Final    Comment:        The GeneXpert MRSA Assay (FDA approved for NASAL specimens only), is one component of a comprehensive MRSA colonization surveillance program. It is not intended to diagnose MRSA infection nor to guide or monitor treatment for MRSA infections.   C difficile quick scan w PCR reflex     Status: None   Collection Time: 08/16/16  8:53 AM  Result Value Ref Range Status    C Diff antigen NEGATIVE NEGATIVE Final   C Diff toxin NEGATIVE NEGATIVE Final   C Diff interpretation No C. difficile detected.  Final     Time coordinating discharge: 35 minutes  SIGNED:  Marzetta Board, MD  [facility] 08/17/2016, 2:48 PM Pager [phone]  If 7PM-7AM, please contact night-coverage [email or web address] Password [email or web address]

## 2016-08-17 NOTE — Care Management Important Message (Signed)
Important Message  Patient Details  Name: SAFIYYA STOKES MRN: [id number] Date of Birth: [birth year]   Medicare Important Message Given:  Yes    Kerin Salen 08/17/2016, 11:29 AMImportant Message  Patient Details  Name: ROSELLA CRANDELL MRN: [id number] Date of Birth: 1935-06-07   Medicare Important Message Given:  Yes    Kerin Salen 08/17/2016, 11:29 AM

## 2016-08-17 NOTE — Progress Notes (Signed)
SATURATION QUALIFICATIONS: (This note is used to comply with regulatory documentation for home oxygen)  Patient Saturations on Room Air at Rest = 90%  Patient Saturations on Room Air while Ambulating = 88%  Patient Saturations on 2 Liters of oxygen while Ambulating= 99-100  %  Please briefly explain why patient needs home oxygen: Alternative methods unsuccessful.

## 2016-08-18 ENCOUNTER — Telehealth: Payer: Self-pay | Admitting: *Deleted

## 2016-08-18 NOTE — Telephone Encounter (Signed)
Transition Care Management Follow-up Telephone Call   Date discharged? 08/17/16   How have you been since you were released from the hospital? Called pt spoke w/daughter she states mom is during ok   Do you understand why you were in the hospital? YES   Do you understand the discharge instructions? YES   Where were you discharged to? Home   Items Reviewed:  Medications reviewed: YES  Allergies reviewed: YES  Dietary changes reviewed: NO  Referrals reviewed: NO REFERRAL NEEDED   Functional Questionnaire:   Activities of Daily Living (ADLs):   She states She are independent in the following: bathing and hygiene, feeding, continence, grooming, toileting and dressing States She require assistance with the following: ambulation   Any transportation issues/concerns?: YES   Any patient concerns? NO   Confirmed importance and date/time of follow-up visits scheduled YES, daughter states mom has appt already schedule for 09/01/16 want to keep appt.    Provider Appointment booked with Dr/ Plotnikov  Confirmed with patient if condition begins to worsen call PCP or go to the ER.  Patient was given the office number and encouraged to call back with question or concerns.  : YES

## 2016-08-20 ENCOUNTER — Other Ambulatory Visit: Payer: Self-pay | Admitting: Cardiology

## 2016-08-21 NOTE — Telephone Encounter (Signed)
Rx(s) sent to pharmacy electronically.  

## 2016-08-23 ENCOUNTER — Other Ambulatory Visit (INDEPENDENT_AMBULATORY_CARE_PROVIDER_SITE_OTHER): Payer: Medicare Other

## 2016-08-23 ENCOUNTER — Encounter: Payer: Self-pay | Admitting: Internal Medicine

## 2016-08-23 ENCOUNTER — Ambulatory Visit (INDEPENDENT_AMBULATORY_CARE_PROVIDER_SITE_OTHER)
Admission: RE | Admit: 2016-08-23 | Discharge: 2016-08-23 | Disposition: A | Discharge status: Home / Self Care | Payer: Medicare Other | Source: Ambulatory Visit | Attending: Internal Medicine | Admitting: Internal Medicine

## 2016-08-23 ENCOUNTER — Ambulatory Visit (INDEPENDENT_AMBULATORY_CARE_PROVIDER_SITE_OTHER): Payer: Medicare Other | Admitting: Internal Medicine

## 2016-08-23 VITALS — BP 152/70 | HR 67 | Temp 97.5°F | Resp 16 | Wt 110.0 lb

## 2016-08-23 DIAGNOSIS — R5383 Other fatigue: Secondary | ICD-10-CM

## 2016-08-23 DIAGNOSIS — I10 Essential (primary) hypertension: Secondary | ICD-10-CM | POA: Diagnosis not present

## 2016-08-23 DIAGNOSIS — R3989 Other symptoms and signs involving the genitourinary system: Secondary | ICD-10-CM | POA: Diagnosis not present

## 2016-08-23 DIAGNOSIS — R0602 Shortness of breath: Secondary | ICD-10-CM

## 2016-08-23 DIAGNOSIS — N179 Acute kidney failure, unspecified: Secondary | ICD-10-CM

## 2016-08-23 LAB — COMPREHENSIVE METABOLIC PANEL
ALT: 12 U/L (ref 0–35)
AST: 19 U/L (ref 0–37)
Albumin: 3.5 g/dL (ref 3.5–5.2)
Alkaline Phosphatase: 66 U/L (ref 39–117)
BUN: 26 mg/dL — ABNORMAL HIGH (ref 6–23)
CO2: 27 mEq/L (ref 19–32)
Calcium: 9.5 mg/dL (ref 8.4–10.5)
Chloride: 105 mEq/L (ref 96–112)
Creatinine, Ser: 1.12 mg/dL (ref 0.40–1.20)
GFR: 49.92 mL/min — ABNORMAL LOW (ref >60.00)
Glucose, Bld: 73 mg/dL (ref 70–99)
Potassium: 4.6 mEq/L (ref 3.5–5.1)
Sodium: 140 mEq/L (ref 135–145)
Total Bilirubin: 0.2 mg/dL (ref 0.2–1.2)
Total Protein: 6.7 g/dL (ref 6.0–8.3)

## 2016-08-23 LAB — URINALYSIS, ROUTINE W REFLEX MICROSCOPIC
Bilirubin Urine: NEGATIVE
Ketones, ur: NEGATIVE
Nitrite: NEGATIVE
Specific Gravity, Urine: 1.025 (ref 1.000–1.030)
Total Protein, Urine: 100 — AB
Urine Glucose: NEGATIVE
Urobilinogen, UA: 0.2 (ref 0.0–1.0)
pH: 6 (ref 5.0–8.0)

## 2016-08-23 LAB — CBC WITH DIFFERENTIAL/PLATELET
Basophils Absolute: 0.2 10*3/uL — ABNORMAL HIGH (ref 0.0–0.1)
Basophils Relative: 1.1 % (ref 0.0–3.0)
Eosinophils Absolute: 0.5 10*3/uL (ref 0.0–0.7)
Eosinophils Relative: 3.2 % (ref 0.0–5.0)
HCT: 34.4 % — ABNORMAL LOW (ref 36.0–46.0)
Hemoglobin: 10.9 g/dL — ABNORMAL LOW (ref 12.0–15.0)
Lymphocytes Relative: 19 % (ref 12.0–46.0)
Lymphs Abs: 2.8 10*3/uL (ref 0.7–4.0)
MCHC: 31.6 g/dL (ref 30.0–36.0)
MCV: 89 fl (ref 78.0–100.0)
Monocytes Absolute: 1.3 10*3/uL — ABNORMAL HIGH (ref 0.1–1.0)
Monocytes Relative: 8.5 % (ref 3.0–12.0)
Neutro Abs: 10.1 10*3/uL — ABNORMAL HIGH (ref 1.4–7.7)
Neutrophils Relative %: 68.2 % (ref 43.0–77.0)
Platelets: 579 10*3/uL — ABNORMAL HIGH (ref 150.0–400.0)
RBC: 3.87 Mil/uL (ref 3.87–5.11)
RDW: 16.6 % — ABNORMAL HIGH (ref 11.5–15.5)
WBC: 14.8 10*3/uL — ABNORMAL HIGH (ref 4.0–10.5)

## 2016-08-23 LAB — BRAIN NATRIURETIC PEPTIDE: Pro B Natriuretic peptide (BNP): 392 pg/mL — ABNORMAL HIGH (ref 0.0–100.0)

## 2016-08-23 MED ORDER — ALBUTEROL SULFATE HFA 108 (90 BASE) MCG/ACT IN AERS: 2.0000 | INHALATION_SPRAY | Freq: Four times a day (QID) | RESPIRATORY_TRACT | 2 refills | Status: DC | PRN

## 2016-08-23 NOTE — Patient Instructions (Signed)
  Test(s) ordered today. Your results will be released to [place] (or called to you) after review, usually within 72hours after test completion. If any changes need to be made, you will be notified at that same time.   Medications reviewed and updated.  No changes recommended at this time. Try the albuterol inhaler.   Your prescription(s) have been submitted to your pharmacy. Please take as directed and contact our office if you believe you are having problem(s) with the medication(s).    Please followup if your symptoms are not improving.

## 2016-08-23 NOTE — Progress Notes (Signed)
Subjective:    Patient ID: [name], female    DOB: 03-07-1935, 81 y.o.   MRN: [id number]  HPI The patient is here for follow up from the hospital.  Admitted 08/10/16-08/17/16.  Recommendations for Outpatient Follow-up:  1. Follow up with PCP in 1-2 weeks 2. Antibiotics for 2 more days at home 3. Oxygen with ambulation at home  She has a history of atrial fibrillation, COPD. She presented to the emergency room with nausea, vomiting and right flank pain for 3 days. CT scan of the abdomen showed a right-sided ureter stone with hydronephrosis. She was found to have acute renal failure. Urinalysis did not show any UTI. She was afebrile. She was admitted and urology was consulted.  Right-sided nephrosis with stone: Stent has been placed on 6/8 by urology. She will need follow-up with them as an outpatient for stent removal in a few weeks.  She has an appointment next week.    Acute renal failure in the setting of obstructive uropathy: Once the stent was placed and she received some IV fluids her kidney function improved. Her creatinine was normal upon discharge.  Acute hypoxic respiratory failure due to pulmonary edema and probable pneumonia: Chest x-ray showed pulmonary edema likely secondary to initial fluid resuscitation. She received IV Lasix 3 doses and became euvolemic. She was placed empirically on azithromycin and was discharged home with 2 additional days. Fraction levels were 88% with ambulation and she was asymptomatic. It was recommended that she have oxygen at home with activities. Was thought that her chronic COPD she likely had borderline oxygen levels with ambulation. She has mild pulmonary hypertension which is likely related to her COPD.  Acute encephalopathy: This was related to anesthesia and resolved.  CT of the head showed no acute changes and was normal for age.  Acute on chronic diastolic CHF: She had a 2-D echo that showed a normal ejection fracture and grade 1  diastolic dysfunction. Was similar to a prior echocardiogram.  Diarrhea: Per the patient she has chronic diarrhea. Was suspected that she probably has IBS. C. difficile was checked and was negative. She was advised to take Imodium as needed and possibly see GI in the future.     Hypertension: Remained stable. She was continued on her home medications.  Paroxysmal atrial fibrillation: She remained in atrial fibrillation. Her home medications were continued. She had refused Coumadin in the past and was continued on aspirin.  COPD: Stable during hospitalization. Albuterol was added.  She does not feel better since leaving the hospital - she feels worse.  She has no appetite, fatigue, SOB, chest pain.  She is frustrated that she feels so poorly and does not seem to be feeling better.    Medications and allergies reviewed with patient and updated if appropriate.  Patient Active Problem List   Diagnosis Date Noted  . Kidney stone 08/13/2016  . Flank pain 08/10/2016  . Right upper quadrant abdominal tenderness without rebound tenderness 08/10/2016  . Leukocytosis 08/10/2016  . Acute abdomen 08/10/2016  . Intractable nausea and vomiting 08/10/2016  . ARF (acute renal failure) ([place]) 08/10/2016  . Urolithiasis 08/10/2016  . Abdominal pain 08/10/2016  . Anemia 05/19/2016  . Generalized anxiety disorder 01/14/2016  . Dysphagia 04/16/2015  . Dry skin dermatitis 12/28/2014  . Atrophic vaginitis 05/26/2014  . Varicose veins of both lower extremities 12/12/2013  . Restless leg syndrome 03/17/2013  . Impacted cerumen of left ear 03/17/2013  . Osteoarthritis, hand 09/05/2011  .  Epistaxis, recurrent 08/17/2011  . Well adult exam 01/31/2011  . Low back pain 01/31/2011  . Palpitations 05/24/2010  . CERUMEN IMPACTION 02/23/2010  . PHARYNGITIS, ACUTE 02/23/2010  . SINUSITIS, ACUTE 01/07/2010  . Shortness of breath 09/23/2009  . BRONCHITIS, ACUTE 08/03/2009  . Osteoarthritis 07/06/2009  . TOBACCO  USE, QUIT 03/08/2009  . ALLERGIC RHINITIS 11/06/2008  . NAUSEA ALONE 08/19/2008  . DIARRHEA 08/19/2008  . Essential hypertension 07/15/2008  . EMPHYSEMA 07/15/2008  . PEPTIC ULCER DISEASE 07/15/2008  . ARTHRITIS 07/15/2008  . HEADACHE 05/25/2008  . SYNCOPE 03/23/2008  . WEIGHT LOSS, ABNORMAL 03/23/2008  . HEARING IMPAIRMENT 12/23/2007  . B12 deficiency 10/14/2007  . Malaise 10/14/2007  . INSOMNIA, PERSISTENT 07/31/2007  . Anxiety state 03/01/2007  . DEPRESSION 03/01/2007  . Atrial fibrillation ([place]) 02/26/2007  . VITAMIN D DEFICIENCY 02/19/2007  . GERD 02/19/2007  . COPD with chronic bronchitis ([place]) 08/07/2006    Current Outpatient Prescriptions on File Prior to Visit  Medication Sig Dispense Refill  . aspirin 81 MG chewable tablet Chew 81 mg by mouth 2 (two) times daily.     . Cholecalciferol ([employer] VITAMIN D3) 1000 UNITS capsule Take 1,000 Units by mouth daily.      . clonazePAM (KLONOPIN) 0.5 MG tablet TAKE 1 OR 2 TABLETS AT BEDTIME 60 tablet 1  . cyanocobalamin (V-R VITAMIN B-12) 500 MCG tablet Take 1 tablet (500 mcg total) by mouth daily. 100 tablet 3  . flecainide (TAMBOCOR) 50 MG tablet TAKE 1 TABLET BY MOUTH TWICE A DAY 60 tablet 6  . lactose free nutrition (BOOST) LIQD Take 237 mLs by mouth daily with breakfast.    . loperamide (IMODIUM) 2 MG capsule Take 1 capsule (2 mg total) by mouth 3 (three) times daily as needed for diarrhea or loose stools. 30 capsule 0  . LORazepam (ATIVAN) 1 MG tablet TAKE 1 OR 2 TABLETS AT BEDTIME AS NEEDED FOR SLEEP OR ANXIETY 60 tablet 1  . metoprolol tartrate (LOPRESSOR) 25 MG tablet Take 0.5 tablets (12.5 mg total) by mouth 2 (two) times daily. 60 tablet 2  . omeprazole (PRILOSEC) 20 MG capsule Take 20 mg by mouth daily.    Marland Kitchen saccharomyces boulardii (FLORASTOR) 250 MG capsule Take 1 capsule (250 mg total) by mouth 2 (two) times daily. 30 capsule 0  . umeclidinium-vilanterol (ANORO ELLIPTA) 62.5-25 MCG/INH AEPB Inhale 1 puff into the lungs  daily. 1 each 5  . albuterol (PROVENTIL HFA;VENTOLIN HFA) 108 (90 Base) MCG/ACT inhaler Inhale 2 puffs into the lungs every 6 (six) hours as needed for wheezing or shortness of breath. (Patient not taking: Reported on 08/23/2016) 1 Inhaler 2   No current facility-administered medications on file prior to visit.     Past Medical History:  Diagnosis Date  . Anxiety   . Arthritis   . Atrial fibrillation ([place])   . B12 deficiency   . COPD (chronic obstructive pulmonary disease) ([place])   . Depression   . GERD (gastroesophageal reflux disease)   . Hypertension   . Osteoarthrosis, hand    both hands  . Peptic ulcer, unspecified site, unspecified as acute or chronic, without mention of hemorrhage, perforation, or obstruction   . Pneumonia   . Vitamin D deficiency disease     Past Surgical History:  Procedure Laterality Date  . BLADDER SURGERY     Bladder tack and intestines  . CYSTOCELE REPAIR    . CYSTOSCOPY WITH STENT PLACEMENT Right 08/11/2016   Procedure: CYSTOSCOPY, URETERSCOPY,  RIGHT RETROGRADE WITH  RIGHT STENT PLACEMENT;  Surgeon: Festus Aloe, MD;  Location: [facility];  Service: Urology;  Laterality: Right;  . SPLENECTOMY      Social History   Social History  . Marital status: Divorced    Spouse name: N/A  . Number of children: 2  . 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 Smoking status: Former Research scientist (life sciences)  . Smokeless tobacco: Never Used  . Alcohol use No  . Drug use: No  . Sexual activity: Not Currently   Other Topics Concern  . None   Social History Narrative   Retired, works part time   Divorced   Former Smoker   1- Son alcoholic abusive   Daughter died in 06-12-06    Family History  Problem Relation Age of Onset  . Heart disease Father   . Arthritis Unknown        Family history of  . Coronary artery disease Unknown        Family history of  . Hypertension Unknown        Family history of  .  Diabetes Daughter        4 died    Review of Systems  Constitutional: Positive for appetite change (decreased) and fatigue. Negative for chills and fever.  Respiratory: Positive for shortness of breath. Negative for cough and wheezing.   Cardiovascular: Positive for chest pain (feels like it did when she was in the hospital). Negative for palpitations and leg swelling.  Gastrointestinal: Positive for diarrhea (chronic). Negative for abdominal pain and nausea.  Genitourinary: Negative for dysuria and hematuria.       Pressure in bladder  Neurological: Positive for dizziness (chronic). Negative for light-headedness and headaches.       Objective:   Vitals:   08/23/16 1310  BP: (!) 152/70  Pulse: 67  Resp: 16  Temp: 97.5 F (36.4 C)   Wt Readings from Last 3 Encounters:  08/23/16 110 lb (49.9 kg)  08/17/16 111 lb 11.2 oz (50.7 kg)  08/10/16 113 lb (51.3 kg)   Body mass index is 19.49 kg/m.   Physical Exam    Constitutional: mildly ill appearing. No distress.  HENT:  Head: Normocephalic and atraumatic.  Neck: Neck supple. No tracheal deviation present. No thyromegaly present.  No cervical lymphadenopathy Cardiovascular: Normal rate, regular rhythm and normal heart sounds.   No murmur heard. No carotid bruit .  No edema Pulmonary/Chest: Effort normal and breath sounds normal. No respiratory distress. No has no wheezes. No rales.  Abdomen: soft, non tender, non distended Skin: Skin is warm and dry. Not diaphoretic.  Psychiatric: Normal mood and affect. Behavior is normal.     ECHOCARDIOGRAM COMPLETE                            *[facility].                        [address], [place] [address]  [phone]  ------------------------------------------------------------------- Transthoracic Echocardiography  Patient:    Stephanie, Coffey MR #:        [id number] Study Date: 08/16/2016 Gender:     F Age:        49 Height:     160 cm Weight:     51.3 kg BSA:        1.51 m^2 Pt. Status: Room:       [place], [place], [place], [place] M  REFERRING    [place], [place] M  PERFORMING   [facility], Inpatient  SONOGRAPHER  Cardell Peach, RDCS  cc:  ------------------------------------------------------------------- LV EF: 55% -   60%  ------------------------------------------------------------------- History:   PMH:  Acute respiratory insufficiency.  Atrial fibrillation.  Risk factors:  Hypertension.  ------------------------------------------------------------------- Study Conclusions  - Left ventricle: The cavity size was normal. There was mild   concentric hypertrophy. Systolic function was normal. The   estimated ejection fraction was in the range of 55% to 60%. Wall   motion was normal; there were no regional wall motion   abnormalities. Doppler parameters are consistent with abnormal   left ventricular relaxation (grade 1 diastolic dysfunction).   Doppler parameters are consistent with high ventricular filling   pressure. - Aortic valve: Transvalvular velocity was within the normal range.   There was no stenosis. There was moderate regurgitation. - Mitral valve: Transvalvular velocity was within the normal range.   There was no evidence for stenosis. There was no regurgitation. - Left atrium: The atrium was mildly dilated. - Right ventricle: The cavity size was normal. Wall thickness was   normal. Systolic function was normal. - Right atrium: The atrium was moderately dilated. - Pulmonary arteries: Systolic pressure was mildly increased. PA   peak pressure: 43 mm Hg (S). - Pericardium, extracardiac: A trivial pericardial effusion was   identified.  ------------------------------------------------------------------- Study data:  No prior study was available  for comparison.  Study status:  Routine.  Procedure:  Transthoracic echocardiography. Image quality was adequate.  Study completion:  There were no complications.          Transthoracic echocardiography.  M-mode, complete 2D, spectral Doppler, and color Doppler.  Birthdate: Patient birthdate: 27-Jul-1935.  Age:  Patient is 81 yr old.  Sex: Gender: female.    BMI: 20 kg/m^2.  Blood pressure:     154/55 Patient status:  Inpatient.  Study date:  Study date: 08/16/2016. Study time: 11:41 AM.  Location:  Bedside.  -------------------------------------------------------------------  ------------------------------------------------------------------- Left ventricle:  The cavity size was normal. There was mild concentric hypertrophy. Systolic function was normal. The estimated ejection fraction was in the range of 55% to 60%. Wall motion was normal; there were no regional wall motion abnormalities. Doppler parameters are consistent with abnormal left ventricular relaxation (grade 1 diastolic dysfunction). Doppler parameters are consistent with high ventricular filling pressure.  ------------------------------------------------------------------- Aortic valve:   Trileaflet; mildly thickened, mildly calcified leaflets. Mobility was not restricted.  Doppler:  Transvalvular velocity was within the normal range. There was no stenosis. There was moderate regurgitation.  ------------------------------------------------------------------- Aorta:  Aortic root: The aortic root was normal in size.  ------------------------------------------------------------------- Mitral valve:   Structurally normal valve.   Mobility was not restricted.  Doppler:  Transvalvular velocity was within the normal range. There was no evidence for stenosis. There was no regurgitation.    Peak gradient (D): 2 mm Hg.  ------------------------------------------------------------------- Left  atrium:  The atrium was mildly  dilated.  ------------------------------------------------------------------- Right ventricle:  The cavity size was normal. Wall thickness was normal. Systolic function was normal.  ------------------------------------------------------------------- Pulmonic valve:    Structurally normal valve.   Cusp separation was normal.  Doppler:  Transvalvular velocity was within the normal range. There was no evidence for stenosis. There was mild regurgitation.  ------------------------------------------------------------------- Tricuspid valve:   Structurally normal valve.    Doppler: Transvalvular velocity was within the normal range. There was mild regurgitation.  ------------------------------------------------------------------- Pulmonary artery:   The main pulmonary artery was normal-sized. Systolic pressure was mildly increased.  ------------------------------------------------------------------- Right atrium:  The atrium was moderately dilated.  ------------------------------------------------------------------- Pericardium:  A trivial pericardial effusion was identified.  ------------------------------------------------------------------- Systemic veins: Inferior vena cava: The vessel was dilated. The respirophasic diameter changes were in the normal range (>= 50%), consistent with elevated central venous pressure.  ------------------------------------------------------------------- Measurements   Left ventricle                           Value        Reference  LV ID, ED, PLAX chordal          (L)     40    mm     43 - 52  LV ID, ES, PLAX chordal                  28.3  mm     23 - 38  LV fx shortening, PLAX chordal           29    %      >=29  LV PW thickness, ED                      11.4  mm     ----------  IVS/LV PW ratio, ED                      1.02         <=1.3  Stroke volume, 2D                        65    ml     ----------  Stroke volume/bsa, 2D                    43     ml/m^2 ----------  LV e&', lateral                           5.22  cm/s   ----------  LV E/e&', lateral                         14.87        ----------  LV e&', medial                            4.57  cm/s   ----------  LV E/e&', medial                          16.98        ----------  LV e&', average  4.9   cm/s   ----------  LV E/e&', average                         15.85        ----------    Ventricular septum                       Value        Reference  IVS thickness, ED                        11.6  mm     ----------    LVOT                                     Value        Reference  LVOT ID, S                               20    mm     ----------  LVOT area                                3.14  cm^2   ----------  LVOT peak velocity, S                    97.5  cm/s   ----------  LVOT mean velocity, S                    62.9  cm/s   ----------  LVOT VTI, S                              20.8  cm     ----------    Aortic valve                             Value        Reference  Aortic regurg pressure half-time         372   ms     ----------    Aorta                                    Value        Reference  Aortic root ID, ED                       26    mm     ----------    Left atrium                              Value        Reference  LA ID, A-P, ES                           36    mm     ----------  LA ID/bsa, A-P                   (  H)     2.39  cm/m^2 <=2.2  LA volume, ES, 1-p A4C                   37.8  ml     ----------  LA volume/bsa, ES, 1-p A4C               25.1  ml/m^2 ----------  LA volume, ES, 1-p A2C                   49.8  ml     ----------  LA volume/bsa, ES, 1-p A2C               33    ml/m^2 ----------    Mitral valve                             Value        Reference  Mitral E-wave peak velocity              77.6  cm/s   ----------  Mitral A-wave peak velocity              80    cm/s   ----------  Mitral deceleration time                  211   ms     150 - 230  Mitral peak gradient, D                  2     mm Hg  ----------  Mitral E/A ratio, peak                   1            ----------    Pulmonary arteries                       Value        Reference  PA pressure, S, DP               (H)     43    mm Hg  <=30    Tricuspid valve                          Value        Reference  Tricuspid regurg peak velocity           297   cm/s   ----------  Tricuspid peak RV-RA gradient            35    mm Hg  ----------    Right atrium                             Value        Reference  RA ID, S-I, ES, A4C              (H)     51.3  mm     34 - 49  RA area, ES, A4C                         18.1  cm^2   8.3 - 19.5  RA volume, ES, A/L  51.9  ml     ----------  RA volume/bsa, ES, A/L                   34.4  ml/m^2 ----------    Systemic veins                           Value        Reference  Estimated CVP                            8     mm Hg  ----------    Right ventricle                          Value        Reference  RV ID, minor axis, ED, A4C base          42.9  mm     ----------  TAPSE                                    16.5  mm     ----------  RV pressure, S, DP               (H)     43    mm Hg  <=30  RV s&', lateral, S                        9.85  cm/s   ----------  Legend: (L)  and  (H)  mark values outside specified reference range.  ------------------------------------------------------------------- Prepared and Electronically Authenticated by  Skeet Latch, MD 2018-06-13T15:17:29     Assessment & Plan:    See Problem List for Assessment and Plan of chronic medical problems.

## 2016-08-24 ENCOUNTER — Encounter (HOSPITAL_COMMUNITY): Payer: Self-pay

## 2016-08-24 DIAGNOSIS — R3989 Other symptoms and signs involving the genitourinary system: Secondary | ICD-10-CM | POA: Insufficient documentation

## 2016-08-24 DIAGNOSIS — R5383 Other fatigue: Secondary | ICD-10-CM | POA: Insufficient documentation

## 2016-08-24 LAB — URINE CULTURE

## 2016-08-24 NOTE — Patient Instructions (Addendum)
[name]  08/24/2016   Your procedure is scheduled on: 08/29/2016    Report to [facility] Main  Entrance Take [place]  elevators to 3rd floor to  [place] at     1145 AM.    Call this number if you have problems the morning of surgery [phone]    Remember: ONLY 1 PERSON MAY GO WITH YOU TO SHORT STAY TO GET  READY MORNING OF [place].  Do not eat food or drink liquids :After Midnight.     Take these medicines the morning of surgery with A SIP OF WATER: Flecainide, ( Tambocor), Lorazepam ( ativan ) if needed, metoprolol ( Lopressor), Omeprazole ( Prilosec), Anoro ellipta and bring                                 You may not have any metal on your body including hair pins and              piercings  Do not wear jewelry, make-up, lotions, powders or perfumes, deodorant             Do not wear nail polish.  Do not shave  48 hours prior to surgery.               Do not bring valuables to the hospital. [place].  Contacts, dentures or bridgework may not be worn into surgery.      Patients discharged the day of surgery will not be allowed to drive home.  Name and phone number of your driver:                Please read over the following fact sheets you were given: _____________________________________________________________________             [facility] - Preparing for Surgery Before surgery, you can play an important role.  Because skin is not sterile, your skin needs to be as free of germs as possible.  You can reduce the number of germs on your skin by washing with CHG (chlorahexidine gluconate) soap before surgery.  CHG is an antiseptic cleaner which kills germs and bonds with the skin to continue killing germs even after washing. Please DO NOT use if you have an allergy to CHG or antibacterial soaps.  If your skin becomes reddened/irritated stop using the CHG and inform your nurse  when you arrive at Short Stay. Do not shave (including legs and underarms) for at least 48 hours prior to the first CHG shower.  You may shave your face/neck. Please follow these instructions carefully:  1.  Shower with CHG Soap the night before surgery and the  morning of Surgery.  2.  If you choose to wash your hair, wash your hair first as usual with your  normal  shampoo.  3.  After you shampoo, rinse your hair and body thoroughly to remove the  shampoo.                           4.  Use CHG as you would any other liquid soap.  You can apply chg directly  to the skin and wash  Gently with a scrungie or clean washcloth.  5.  Apply the CHG Soap to your body ONLY FROM THE NECK DOWN.   Do not use on face/ open                           Wound or open sores. Avoid contact with eyes, ears mouth and genitals (private parts).                       Wash face,  Genitals (private parts) with your normal soap.             6.  Wash thoroughly, paying special attention to the area where your surgery  will be performed.  7.  Thoroughly rinse your body with warm water from the neck down.  8.  DO NOT shower/wash with your normal soap after using and rinsing off  the CHG Soap.                9.  Pat yourself dry with a clean towel.            10.  Wear clean pajamas.            11.  Place clean sheets on your bed the night of your first shower and do not  sleep with pets. Day of Surgery : Do not apply any lotions/deodorants the morning of surgery.  Please wear clean clothes to the hospital/surgery center.  FAILURE TO FOLLOW THESE INSTRUCTIONS MAY RESULT IN THE CANCELLATION OF YOUR SURGERY PATIENT SIGNATURE_________________________________  NURSE SIGNATURE__________________________________  ________________________________________________________________________    CLEAR LIQUID DIET   Foods Allowed                                                                     Foods  Excluded  Coffee and tea, regular and decaf                             liquids that you cannot  Plain Jell-O in any flavor                                             see through such as: Fruit ices (not with fruit pulp)                                     milk, soups, orange juice  Iced Popsicles                                    All solid food Carbonated beverages, regular and diet                                    Cranberry, grape and apple juices Sports drinks like Gatorade Lightly seasoned clear broth or consume(fat free) Sugar, honey syrup  Sample Menu Breakfast                                Lunch                                     Supper Cranberry juice                    Beef broth                            Chicken broth Jell-O                                     Grape juice                           Apple juice Coffee or tea                        Jell-O                                      Popsicle                                                Coffee or tea                        Coffee or tea  _____________________________________________________________________

## 2016-08-24 NOTE — Assessment & Plan Note (Addendum)
Likely related to COPD Refuses oxygen not using anoro - did not help Using albuterol as needed, which helps - needs a refill Will check cxr today BNP given fluid overload in hospital - has grade 1 DD

## 2016-08-24 NOTE — Assessment & Plan Note (Signed)
Elevated here today - given how she feels - no change in meds today Monitor only

## 2016-08-24 NOTE — Assessment & Plan Note (Signed)
Resolved in hospital cmp today

## 2016-08-24 NOTE — Assessment & Plan Note (Signed)
?   Related to stent/stone Check UA, UCx Has urology f/u next week - stressed the importance of going to that appointment Check labs

## 2016-08-24 NOTE — Assessment & Plan Note (Signed)
?   Recovering from PNA Should be improving Check labs

## 2016-08-25 ENCOUNTER — Other Ambulatory Visit: Payer: Self-pay | Admitting: Internal Medicine

## 2016-08-25 MED ORDER — CEPHALEXIN 500 MG PO CAPS: 500.0000 mg | ORAL_CAPSULE | Freq: Three times a day (TID) | ORAL | 0 refills | Status: DC

## 2016-08-28 ENCOUNTER — Encounter (HOSPITAL_COMMUNITY)
Admission: RE | Admit: 2016-08-28 | Discharge: 2016-08-28 | Disposition: A | Discharge status: Home / Self Care | Payer: Medicare Other | Source: Ambulatory Visit | Attending: Urology | Admitting: Urology

## 2016-08-28 ENCOUNTER — Telehealth: Payer: Self-pay | Admitting: Internal Medicine

## 2016-08-28 NOTE — Telephone Encounter (Signed)
Pt would like a call back, she wants to know why she is very cvold and has no energy she believes it is related to her cephALEXin (KEFLEX) 500 MG capsule

## 2016-08-28 NOTE — Telephone Encounter (Signed)
It is not related to the medication - she should be re-evaluated.  Being cold can be the same as having a fever.

## 2016-08-29 NOTE — Telephone Encounter (Signed)
Spoke with pt to inform. Pt asked to move appt up from 7/03 to see Dr Camila Li sooner

## 2016-08-30 ENCOUNTER — Encounter: Payer: Self-pay | Admitting: Internal Medicine

## 2016-08-30 ENCOUNTER — Ambulatory Visit (INDEPENDENT_AMBULATORY_CARE_PROVIDER_SITE_OTHER): Payer: Medicare Other | Admitting: Internal Medicine

## 2016-08-30 DIAGNOSIS — J189 Pneumonia, unspecified organism: Secondary | ICD-10-CM | POA: Diagnosis not present

## 2016-08-30 DIAGNOSIS — E538 Deficiency of other specified B group vitamins: Secondary | ICD-10-CM | POA: Diagnosis not present

## 2016-08-30 DIAGNOSIS — I10 Essential (primary) hypertension: Secondary | ICD-10-CM | POA: Diagnosis not present

## 2016-08-30 DIAGNOSIS — N209 Urinary calculus, unspecified: Secondary | ICD-10-CM | POA: Diagnosis not present

## 2016-08-30 DIAGNOSIS — N200 Calculus of kidney: Secondary | ICD-10-CM | POA: Diagnosis not present

## 2016-08-30 MED ORDER — LORAZEPAM 1 MG PO TABS: ORAL_TABLET | ORAL | 1 refills | Status: DC

## 2016-08-30 NOTE — Assessment & Plan Note (Signed)
CXR 6/20 reviewed

## 2016-08-30 NOTE — Progress Notes (Signed)
Subjective:  Patient ID: [name], female    DOB: Mar 10, 1935  Age: 81 y.o. MRN: [id number]  CC: No chief complaint on file.   HPI   [name] presents for a post-hosp visit (adm 6/7-6/14/18) and to f/u on her R kidney stone, UTI, CAP. She has been on abx. She had a R ureter stent placed. She had a COPD flare up as well...     Outpatient Medications Prior to Visit  Medication Sig Dispense Refill  . albuterol (PROVENTIL HFA;VENTOLIN HFA) 108 (90 Base) MCG/ACT inhaler Inhale 2 puffs into the lungs every 6 (six) hours as needed for wheezing or shortness of breath. 1 Inhaler 2  . Aspirin-Salicylamide-Caffeine (BC HEADACHE POWDER PO) Take 1 packet by mouth daily as needed (headaches).    . Biotin 1000 MCG tablet Take 1,000 mcg by mouth daily.    . cephALEXin (KEFLEX) 500 MG capsule Take 1 capsule (500 mg total) by mouth 3 (three) times daily. 21 capsule 0  . Cholecalciferol (CVS VITAMIN D3) 1000 UNITS capsule Take 1,000 Units by mouth daily.      . clonazePAM (KLONOPIN) 0.5 MG tablet TAKE 1 OR 2 TABLETS AT BEDTIME (Patient taking differently: TAKE 1 TABLET AT BEDTIME AS NEEDED FOR LEG CRAMPS) 60 tablet 1  . cyanocobalamin (V-R VITAMIN B-12) 500 MCG tablet Take 1 tablet (500 mcg total) by mouth daily. 100 tablet 3  . cyanocobalamin 1000 MCG tablet Take 1,000 mcg by mouth daily.    . flecainide (TAMBOCOR) 50 MG tablet TAKE 1 TABLET BY MOUTH TWICE A DAY 60 tablet 6  . loperamide (IMODIUM) 2 MG capsule Take 1 capsule (2 mg total) by mouth 3 (three) times daily as needed for diarrhea or loose stools. 30 capsule 0  . LORazepam (ATIVAN) 1 MG tablet TAKE 1 OR 2 TABLETS AT BEDTIME AS NEEDED FOR SLEEP OR ANXIETY (Patient taking differently: TAKE 1 TABLET AS NEEDED FOR ANXIETY) 60 tablet 1  . metoprolol tartrate (LOPRESSOR) 25 MG tablet Take 0.5 tablets (12.5 mg total) by mouth 2 (two) times daily. 60 tablet 2  . omeprazole (PRILOSEC) 20 MG capsule Take 20 mg by mouth daily.    Marland Kitchen saccharomyces  boulardii (FLORASTOR) 250 MG capsule Take 1 capsule (250 mg total) by mouth 2 (two) times daily. 30 capsule 0  . umeclidinium-vilanterol (ANORO ELLIPTA) 62.5-25 MCG/INH AEPB Inhale 1 puff into the lungs daily. 1 each 5   No facility-administered medications prior to visit.     ROS Review of Systems  Constitutional: Positive for fatigue and unexpected weight change. Negative for activity change, appetite change and chills.  HENT: Negative for congestion, mouth sores and sinus pressure.   Eyes: Negative for visual disturbance.  Respiratory: Positive for shortness of breath. Negative for cough and chest tightness.   Gastrointestinal: Negative for abdominal pain and nausea.  Genitourinary: Positive for dysuria. Negative for difficulty urinating, frequency and vaginal pain.  Musculoskeletal: Positive for arthralgias. Negative for back pain and gait problem.  Skin: Negative for pallor and rash.  Neurological: Positive for weakness. Negative for dizziness, tremors, numbness and headaches.  Psychiatric/Behavioral: Negative for confusion, sleep disturbance and suicidal ideas.    Objective:  BP 126/70 (BP Location: Left Arm, Patient Position: Sitting, Cuff Size: Normal)   Pulse 65   Temp 97.5 F (36.4 C) (Oral)   Ht 5\' 3"  (1.6 m)   Wt 111 lb (50.3 kg)   SpO2 99%   BMI 19.66 kg/m   BP Readings from Last  3 Encounters:  08/30/16 126/70  08/23/16 (!) 152/70  08/17/16 (!) 142/60    Wt Readings from Last 3 Encounters:  08/30/16 111 lb (50.3 kg)  08/23/16 110 lb (49.9 kg)  08/17/16 111 lb 11.2 oz (50.7 kg)    Physical Exam  Constitutional: She appears well-developed. No distress.  HENT:  Head: Normocephalic.  Right Ear: External ear normal.  Left Ear: External ear normal.  Nose: Nose normal.  Mouth/Throat: Oropharynx is clear and moist.  Eyes: Conjunctivae are normal. Pupils are equal, round, and reactive to light. Right eye exhibits no discharge. Left eye exhibits no discharge.    Neck: Normal range of motion. Neck supple. No JVD present. No tracheal deviation present. No thyromegaly present.  Cardiovascular: Normal rate, regular rhythm and normal heart sounds.   Pulmonary/Chest: No stridor. No respiratory distress. She has no wheezes.  Abdominal: Soft. Bowel sounds are normal. She exhibits no distension and no mass. There is no tenderness. There is no rebound and no guarding.  Musculoskeletal: She exhibits no edema or tenderness.  Lymphadenopathy:    She has no cervical adenopathy.  Neurological: She displays normal reflexes. No cranial nerve deficit. She exhibits normal muscle tone. Coordination normal.  Skin: No rash noted. No erythema.  Psychiatric: She has a normal mood and affect. Her behavior is normal. Judgment and thought content normal.  abd lower 1/2 is sensitive  Lab Results  Component Value Date   WBC 14.8 (H) 08/23/2016   HGB 10.9 (L) 08/23/2016   HCT 34.4 (L) 08/23/2016   PLT 579.0 (H) 08/23/2016   GLUCOSE 73 08/23/2016   CHOL 199 03/21/2013   TRIG 107.0 03/21/2013   HDL 57.50 03/21/2013   LDLDIRECT 144.3 01/31/2011   LDLCALC 120 (H) 03/21/2013   ALT 12 08/23/2016   AST 19 08/23/2016   NA 140 08/23/2016   K 4.6 08/23/2016   CL 105 08/23/2016   CREATININE 1.12 08/23/2016   BUN 26 (H) 08/23/2016   CO2 27 08/23/2016   TSH 1.19 03/20/2014   INR 0.9 08/13/2008    Dg Chest 2 View  Result Date: 08/23/2016 CLINICAL DATA:  Recent pneumonia. Follow-up. Shortness of breath. History of COPD. EXAM: CHEST  2 VIEW COMPARISON:  08/13/2016 FINDINGS: The cardiac silhouette is mildly enlarged. Aortic atherosclerosis is noted. The lungs are hyperinflated. Bibasilar airspace opacities and pleural effusions have resolved. No new airspace consolidation, edema, or pneumothorax is identified. Surgical clips are present in the left upper abdomen. No acute osseous abnormality is seen. IMPRESSION: Resolved pleural effusions and basilar airspace disease. No new  abnormality. Emphysema. Electronically Signed   By: Logan Bores M.D.   On: 08/23/2016 14:45    Assessment & Plan:   There are no diagnoses linked to this encounter. I am having Ms. Reny maintain her Cholecalciferol, omeprazole, cyanocobalamin, metoprolol tartrate, umeclidinium-vilanterol, clonazePAM, LORazepam, loperamide, saccharomyces boulardii, flecainide, albuterol, cyanocobalamin, Biotin, Aspirin-Salicylamide-Caffeine (BC HEADACHE POWDER PO), and cephALEXin.  No orders of the defined types were placed in this encounter.    Follow-up: No Follow-up on file.  Walker Kehr, MD

## 2016-08-30 NOTE — Assessment & Plan Note (Signed)
R - s/p STENT On abx

## 2016-08-30 NOTE — Assessment & Plan Note (Signed)
On Norvasc 

## 2016-08-30 NOTE — Assessment & Plan Note (Signed)
On B12 

## 2016-08-31 ENCOUNTER — Other Ambulatory Visit: Payer: Self-pay

## 2016-08-31 MED ORDER — METOPROLOL TARTRATE 25 MG PO TABS: 12.5000 mg | ORAL_TABLET | Freq: Two times a day (BID) | ORAL | 2 refills | Status: DC

## 2016-09-01 ENCOUNTER — Ambulatory Visit: Payer: Self-pay | Admitting: Internal Medicine

## 2016-09-05 ENCOUNTER — Ambulatory Visit: Payer: Medicare Other | Admitting: Internal Medicine

## 2016-09-05 NOTE — Progress Notes (Signed)
[place] [facility] Crenshaw MD 04-26-16 epic  LOV  Plotnikov 08-25-16 epic  EKG 04-26-16 reviewed cy Crenshaw   ECHO 08-16-16 MD note reads "aortic valve: Transvalvular velocity was within the normal range.   There was no stenosis. There was moderate regurgitation. - Mitral valve: Transvalvular velocity was within the normal range.  There was no evidence for stenosis. There was no regurgitation"  CBCdiff, CMP, U/A, Urine culture, 08-23-16 epic  CXR 08-23-16 epic

## 2016-09-05 NOTE — Patient Instructions (Signed)
LASASHA BROPHY  09/05/2016   Your procedure is scheduled on:09-11-16  Report to [facility] Main  Entrance Take [place]  elevators to 3rd floor to  [place] at     1145 AM.    Call this number if you have problems the morning of surgery [phone]    Remember: ONLY 1 PERSON MAY GO WITH YOU TO SHORT STAY TO GET  READY MORNING OF [place].  Do not eat food After Midnight. You may have clear liquids from midnight until 745am day of surgery . Nothing by mouth afer 745am!!     Take these medicines the morning of surgery with A SIP OF WATER: Flecainide, ( Tambocor), Lorazepam ( ativan ) if needed, metoprolol ( Lopressor), Omeprazole ( Prilosec), inhalers as needed (may bring to hospital)                                 You may not have any metal on your body including hair pins and              piercings  Do not wear jewelry, make-up, lotions, powders or perfumes, deodorant             Do not wear nail polish.  Do not shave  48 hours prior to surgery.               Do not bring valuables to the hospital. [place].  Contacts, dentures or bridgework may not be worn into surgery.      Patients discharged the day of surgery will not be allowed to drive home.  Name and phone number of your driver:                Please read over the following fact sheets you were given: _____________________________________________________________________     CLEAR LIQUID DIET   Foods Allowed                                                                     Foods Excluded  Coffee and tea, regular and decaf                             liquids that you cannot  Plain Jell-O in any flavor                                             see through such as: Fruit ices (not with fruit pulp)                                     milk, soups, orange juice  Iced Popsicles  All solid  food Carbonated beverages, regular and diet                                    Cranberry, grape and apple juices Sports drinks like Gatorade Lightly seasoned clear broth or consume(fat free) Sugar, honey syrup  Sample Menu Breakfast                                Lunch                                     Supper Cranberry juice                    Beef broth                            Chicken broth Jell-O                                     Grape juice                           Apple juice Coffee or tea                        Jell-O                                      Popsicle                                                Coffee or tea                        Coffee or tea  _____________________________________________________________________             [facility] - Preparing for Surgery Before surgery, you can play an important role.  Because skin is not sterile, your skin needs to be as free of germs as possible.  You can reduce the number of germs on your skin by washing with CHG (chlorahexidine gluconate) soap before surgery.  CHG is an antiseptic cleaner which kills germs and bonds with the skin to continue killing germs even after washing. Please DO NOT use if you have an allergy to CHG or antibacterial soaps.  If your skin becomes reddened/irritated stop using the CHG and inform your nurse when you arrive at Short Stay. Do not shave (including legs and underarms) for at least 48 hours prior to the first CHG shower.  You may shave your face/neck. Please follow these instructions carefully:  1.  Shower with CHG Soap the night before surgery and the  morning of Surgery.  2.  If you choose to wash your hair, wash your hair first as usual with your  normal  shampoo.  3.  After you shampoo, rinse your hair and body thoroughly to remove the  shampoo.                           4.  Use CHG as you would any other liquid soap.  You can apply chg directly  to the skin and wash                        Gently with a scrungie or clean washcloth.  5.  Apply the CHG Soap to your body ONLY FROM THE NECK DOWN.   Do not use on face/ open                           Wound or open sores. Avoid contact with eyes, ears mouth and genitals (private parts).                       Wash face,  Genitals (private parts) with your normal soap.             6.  Wash thoroughly, paying special attention to the area where your surgery  will be performed.  7.  Thoroughly rinse your body with warm water from the neck down.  8.  DO NOT shower/wash with your normal soap after using and rinsing off  the CHG Soap.                9.  Pat yourself dry with a clean towel.            10.  Wear clean pajamas.            11.  Place clean sheets on your bed the night of your first shower and do not  sleep with pets. Day of Surgery : Do not apply any lotions/deodorants the morning of surgery.  Please wear clean clothes to the hospital/surgery center.  FAILURE TO FOLLOW THESE INSTRUCTIONS MAY RESULT IN THE CANCELLATION OF YOUR SURGERY PATIENT SIGNATURE_________________________________  NURSE SIGNATURE__________________________________  ________________________________________________________________________

## 2016-09-07 ENCOUNTER — Encounter (HOSPITAL_COMMUNITY)
Admission: RE | Admit: 2016-09-07 | Discharge: 2016-09-07 | Disposition: A | Discharge status: Home / Self Care | Payer: Medicare Other | Source: Ambulatory Visit | Attending: Urology | Admitting: Urology

## 2016-09-07 ENCOUNTER — Encounter (HOSPITAL_COMMUNITY): Payer: Self-pay

## 2016-09-07 DIAGNOSIS — Z01812 Encounter for preprocedural laboratory examination: Secondary | ICD-10-CM | POA: Diagnosis not present

## 2016-09-07 HISTORY — DX: Other specified postprocedural states: Z98.890

## 2016-09-07 HISTORY — DX: Other specified postprocedural states: R11.2

## 2016-09-07 LAB — CBC
HCT: 33.7 % — ABNORMAL LOW (ref 36.0–46.0)
Hemoglobin: 10.6 g/dL — ABNORMAL LOW (ref 12.0–15.0)
MCH: 28.3 pg (ref 26.0–34.0)
MCHC: 31.5 g/dL (ref 30.0–36.0)
MCV: 89.9 fL (ref 78.0–100.0)
Platelets: 483 10*3/uL — ABNORMAL HIGH (ref 150–400)
RBC: 3.75 MIL/uL — ABNORMAL LOW (ref 3.87–5.11)
RDW: 16.9 % — ABNORMAL HIGH (ref 11.5–15.5)
WBC: 10.8 10*3/uL — ABNORMAL HIGH (ref 4.0–10.5)

## 2016-09-07 NOTE — Progress Notes (Signed)
Cbc result routed via epic to Dr Alyson Ingles

## 2016-09-10 MED ORDER — GENTAMICIN SULFATE 40 MG/ML IJ SOLN
5.0000 mg/kg | Freq: Once | INTRAVENOUS | Status: AC
  Administered 2016-09-11: 260 mg via INTRAVENOUS
  Filled 2016-09-10: qty 6.5

## 2016-09-11 ENCOUNTER — Ambulatory Visit (HOSPITAL_COMMUNITY): Payer: Medicare Other | Admitting: Anesthesiology

## 2016-09-11 ENCOUNTER — Ambulatory Visit (HOSPITAL_COMMUNITY): Payer: Medicare Other

## 2016-09-11 ENCOUNTER — Encounter (HOSPITAL_COMMUNITY)
Admission: RE | Disposition: A | Discharge status: Home / Self Care | Payer: Self-pay | Source: Ambulatory Visit | Attending: Urology

## 2016-09-11 ENCOUNTER — Encounter (HOSPITAL_COMMUNITY): Payer: Self-pay | Admitting: *Deleted

## 2016-09-11 ENCOUNTER — Observation Stay (HOSPITAL_COMMUNITY)
Admission: RE | Admit: 2016-09-11 | Discharge: 2016-09-12 | Disposition: A | Discharge status: Home / Self Care | Payer: Medicare Other | Source: Ambulatory Visit | Attending: Urology | Admitting: Urology

## 2016-09-11 DIAGNOSIS — E538 Deficiency of other specified B group vitamins: Secondary | ICD-10-CM | POA: Diagnosis not present

## 2016-09-11 DIAGNOSIS — Z79899 Other long term (current) drug therapy: Secondary | ICD-10-CM | POA: Insufficient documentation

## 2016-09-11 DIAGNOSIS — N201 Calculus of ureter: Secondary | ICD-10-CM | POA: Diagnosis not present

## 2016-09-11 DIAGNOSIS — K219 Gastro-esophageal reflux disease without esophagitis: Secondary | ICD-10-CM | POA: Diagnosis not present

## 2016-09-11 DIAGNOSIS — E559 Vitamin D deficiency, unspecified: Secondary | ICD-10-CM | POA: Insufficient documentation

## 2016-09-11 DIAGNOSIS — Z87891 Personal history of nicotine dependence: Secondary | ICD-10-CM | POA: Insufficient documentation

## 2016-09-11 DIAGNOSIS — I4891 Unspecified atrial fibrillation: Secondary | ICD-10-CM | POA: Insufficient documentation

## 2016-09-11 DIAGNOSIS — J449 Chronic obstructive pulmonary disease, unspecified: Secondary | ICD-10-CM | POA: Diagnosis not present

## 2016-09-11 DIAGNOSIS — Z466 Encounter for fitting and adjustment of urinary device: Secondary | ICD-10-CM | POA: Diagnosis not present

## 2016-09-11 DIAGNOSIS — I10 Essential (primary) hypertension: Secondary | ICD-10-CM | POA: Diagnosis not present

## 2016-09-11 DIAGNOSIS — F419 Anxiety disorder, unspecified: Secondary | ICD-10-CM | POA: Insufficient documentation

## 2016-09-11 DIAGNOSIS — N200 Calculus of kidney: Principal | ICD-10-CM | POA: Insufficient documentation

## 2016-09-11 HISTORY — PX: CYSTOSCOPY WITH RETROGRADE PYELOGRAM, URETEROSCOPY AND STENT PLACEMENT: SHX5789

## 2016-09-11 SURGERY — CYSTOURETEROSCOPY, WITH RETROGRADE PYELOGRAM AND STENT INSERTION
Anesthesia: General | Laterality: Right

## 2016-09-11 MED ORDER — CLONAZEPAM 0.5 MG PO TABS: 0.2500 mg | ORAL_TABLET | Freq: Two times a day (BID) | ORAL | Status: DC | PRN

## 2016-09-11 MED ORDER — ONDANSETRON HCL 4 MG/2ML IJ SOLN
INTRAMUSCULAR | Status: AC
  Filled 2016-09-11: qty 2

## 2016-09-11 MED ORDER — PROMETHAZINE HCL 25 MG/ML IJ SOLN
6.2500 mg | INTRAMUSCULAR | Status: DC | PRN
  Administered 2016-09-11: 6.25 mg via INTRAVENOUS

## 2016-09-11 MED ORDER — METOPROLOL TARTRATE 25 MG PO TABS
12.5000 mg | ORAL_TABLET | Freq: Two times a day (BID) | ORAL | Status: DC
  Administered 2016-09-11 – 2016-09-12 (×2): 12.5 mg via ORAL
  Filled 2016-09-11 (×2): qty 1

## 2016-09-11 MED ORDER — LIDOCAINE HCL (CARDIAC) 10 MG/ML IV SOLN
INTRAVENOUS | Status: DC | PRN
  Administered 2016-09-11: 50 mg via INTRAVENOUS

## 2016-09-11 MED ORDER — FENTANYL CITRATE (PF) 100 MCG/2ML IJ SOLN
INTRAMUSCULAR | Status: AC
  Filled 2016-09-11: qty 2

## 2016-09-11 MED ORDER — ALBUTEROL SULFATE (2.5 MG/3ML) 0.083% IN NEBU: 3.0000 mL | INHALATION_SOLUTION | Freq: Four times a day (QID) | RESPIRATORY_TRACT | Status: DC | PRN

## 2016-09-11 MED ORDER — PROPOFOL 10 MG/ML IV BOLUS
INTRAVENOUS | Status: AC
  Filled 2016-09-11: qty 20

## 2016-09-11 MED ORDER — HYDROCODONE-ACETAMINOPHEN 5-325 MG PO TABS
1.0000 | ORAL_TABLET | ORAL | Status: DC | PRN
  Administered 2016-09-11: 1 via ORAL
  Filled 2016-09-11: qty 1

## 2016-09-11 MED ORDER — ACETAMINOPHEN 325 MG PO TABS: 650.0000 mg | ORAL_TABLET | ORAL | Status: DC | PRN

## 2016-09-11 MED ORDER — ZOLPIDEM TARTRATE 5 MG PO TABS: 5.0000 mg | ORAL_TABLET | Freq: Every evening | ORAL | Status: DC | PRN

## 2016-09-11 MED ORDER — MIDAZOLAM HCL 2 MG/2ML IJ SOLN
INTRAMUSCULAR | Status: AC
  Filled 2016-09-11: qty 2

## 2016-09-11 MED ORDER — DIPHENHYDRAMINE HCL 50 MG/ML IJ SOLN: 12.5000 mg | Freq: Four times a day (QID) | INTRAMUSCULAR | Status: DC | PRN

## 2016-09-11 MED ORDER — MIDAZOLAM HCL 5 MG/5ML IJ SOLN
INTRAMUSCULAR | Status: DC | PRN
  Administered 2016-09-11 (×2): 0.5 mg via INTRAVENOUS

## 2016-09-11 MED ORDER — DEXAMETHASONE SODIUM PHOSPHATE 10 MG/ML IJ SOLN
INTRAMUSCULAR | Status: DC | PRN
  Administered 2016-09-11: 5 mg via INTRAVENOUS

## 2016-09-11 MED ORDER — PANTOPRAZOLE SODIUM 40 MG PO TBEC
40.0000 mg | DELAYED_RELEASE_TABLET | Freq: Every day | ORAL | Status: DC
  Administered 2016-09-12: 40 mg via ORAL
  Filled 2016-09-11: qty 1

## 2016-09-11 MED ORDER — PROPOFOL 10 MG/ML IV BOLUS
INTRAVENOUS | Status: DC | PRN
  Administered 2016-09-11: 80 mg via INTRAVENOUS

## 2016-09-11 MED ORDER — FENTANYL CITRATE (PF) 100 MCG/2ML IJ SOLN
INTRAMUSCULAR | Status: DC | PRN
  Administered 2016-09-11 (×2): 50 ug via INTRAVENOUS
  Administered 2016-09-11 (×2): 25 ug via INTRAVENOUS
  Administered 2016-09-11: 50 ug via INTRAVENOUS

## 2016-09-11 MED ORDER — PROMETHAZINE HCL 25 MG/ML IJ SOLN
INTRAMUSCULAR | Status: AC
  Filled 2016-09-11: qty 1

## 2016-09-11 MED ORDER — METOCLOPRAMIDE HCL 5 MG/ML IJ SOLN
INTRAMUSCULAR | Status: AC
  Filled 2016-09-11: qty 2

## 2016-09-11 MED ORDER — METOCLOPRAMIDE HCL 5 MG/ML IJ SOLN: 10.0000 mg | Freq: Once | INTRAMUSCULAR | Status: DC | PRN

## 2016-09-11 MED ORDER — FENTANYL CITRATE (PF) 100 MCG/2ML IJ SOLN
25.0000 ug | INTRAMUSCULAR | Status: DC | PRN
  Administered 2016-09-11: 25 ug via INTRAVENOUS
  Filled 2016-09-11: qty 2

## 2016-09-11 MED ORDER — UMECLIDINIUM-VILANTEROL 62.5-25 MCG/INH IN AEPB
1.0000 | INHALATION_SPRAY | Freq: Every day | RESPIRATORY_TRACT | Status: DC
  Administered 2016-09-12: 09:00:00 1 via RESPIRATORY_TRACT
  Filled 2016-09-11: qty 14

## 2016-09-11 MED ORDER — ONDANSETRON HCL 4 MG/2ML IJ SOLN
INTRAMUSCULAR | Status: DC | PRN
  Administered 2016-09-11: 4 mg via INTRAVENOUS

## 2016-09-11 MED ORDER — DIPHENHYDRAMINE HCL 12.5 MG/5ML PO ELIX: 12.5000 mg | ORAL_SOLUTION | Freq: Four times a day (QID) | ORAL | Status: DC | PRN

## 2016-09-11 MED ORDER — ONDANSETRON HCL 4 MG/2ML IJ SOLN
4.0000 mg | INTRAMUSCULAR | Status: DC | PRN
  Administered 2016-09-11 – 2016-09-12 (×3): 4 mg via INTRAVENOUS
  Filled 2016-09-11 (×2): qty 2

## 2016-09-11 MED ORDER — DEXAMETHASONE SODIUM PHOSPHATE 10 MG/ML IJ SOLN
INTRAMUSCULAR | Status: AC
  Filled 2016-09-11: qty 1

## 2016-09-11 MED ORDER — FLECAINIDE ACETATE 50 MG PO TABS
50.0000 mg | ORAL_TABLET | Freq: Two times a day (BID) | ORAL | Status: DC
  Administered 2016-09-11 – 2016-09-12 (×2): 50 mg via ORAL
  Filled 2016-09-11 (×2): qty 1

## 2016-09-11 MED ORDER — LACTATED RINGERS IV SOLN
INTRAVENOUS | Status: DC
  Administered 2016-09-11: 13:00:00 via INTRAVENOUS
  Administered 2016-09-11: 1000 mL via INTRAVENOUS

## 2016-09-11 MED ORDER — MEPERIDINE HCL 50 MG/ML IJ SOLN: 6.2500 mg | INTRAMUSCULAR | Status: DC | PRN

## 2016-09-11 MED ORDER — FENTANYL CITRATE (PF) 100 MCG/2ML IJ SOLN
25.0000 ug | INTRAMUSCULAR | Status: DC | PRN
  Administered 2016-09-11: 50 ug via INTRAVENOUS

## 2016-09-11 MED ORDER — IOPAMIDOL (ISOVUE-300) INJECTION 61%
INTRAVENOUS | Status: DC | PRN
  Administered 2016-09-11: 6 mL via URETHRAL

## 2016-09-11 MED ORDER — HYOSCYAMINE SULFATE 0.125 MG SL SUBL
0.1250 mg | SUBLINGUAL_TABLET | SUBLINGUAL | Status: DC | PRN
  Filled 2016-09-11: qty 1

## 2016-09-11 MED ORDER — PROPOFOL 500 MG/50ML IV EMUL
INTRAVENOUS | Status: DC | PRN
  Administered 2016-09-11: 50 ug/kg/min via INTRAVENOUS

## 2016-09-11 MED ORDER — FENTANYL CITRATE (PF) 250 MCG/5ML IJ SOLN
INTRAMUSCULAR | Status: AC
  Filled 2016-09-11: qty 5

## 2016-09-11 MED ORDER — SODIUM CHLORIDE 0.9 % IJ SOLN
INTRAMUSCULAR | Status: AC
  Filled 2016-09-11: qty 10

## 2016-09-11 SURGICAL SUPPLY — 28 items
BAG URO CATCHER STRL LF (MISCELLANEOUS) ×3 IMPLANT
BASKET DAKOTA 1.9FR 11X120 (BASKET) IMPLANT
BASKET LASER NITINOL 1.9FR (BASKET) IMPLANT
CATH FOLEY LATEX FREE 20FR (CATHETERS)
CATH FOLEY LF 20FR (CATHETERS) IMPLANT
CATH INTERMIT  6FR 70CM (CATHETERS) ×3 IMPLANT
CLOTH BEACON ORANGE TIMEOUT ST (SAFETY) ×3 IMPLANT
COVER SURGICAL LIGHT HANDLE (MISCELLANEOUS) ×3 IMPLANT
EXTRACTOR STONE NITINOL NGAGE (UROLOGICAL SUPPLIES) ×3 IMPLANT
FIBER LASER FLEXIVA 1000 (UROLOGICAL SUPPLIES) IMPLANT
FIBER LASER FLEXIVA 365 (UROLOGICAL SUPPLIES) IMPLANT
FIBER LASER FLEXIVA 550 (UROLOGICAL SUPPLIES) IMPLANT
FIBER LASER TRAC TIP (UROLOGICAL SUPPLIES) IMPLANT
GLOVE BIO SURGEON STRL SZ8 (GLOVE) ×3 IMPLANT
GOWN STRL REUS W/TWL XL LVL3 (GOWN DISPOSABLE) ×3 IMPLANT
GUIDEWIRE ANG ZIPWIRE 038X150 (WIRE) ×6 IMPLANT
GUIDEWIRE STR DUAL SENSOR (WIRE) ×3 IMPLANT
IV NS 1000ML (IV SOLUTION) ×2
IV NS 1000ML BAXH (IV SOLUTION) ×1 IMPLANT
MANIFOLD NEPTUNE II (INSTRUMENTS) ×3 IMPLANT
PACK CYSTO (CUSTOM PROCEDURE TRAY) ×3 IMPLANT
SHEATH ACCESS URETERAL 38CM (SHEATH) ×3 IMPLANT
STENT CONTOUR 6FRX26X.038 (STENTS) ×3 IMPLANT
SYR CONTROL 10ML LL (SYRINGE) IMPLANT
SYRINGE IRR TOOMEY STRL 70CC (SYRINGE) IMPLANT
TUBE FEEDING 8FR 16IN STR KANG (MISCELLANEOUS) IMPLANT
TUBING CONNECTING 10 (TUBING) ×2 IMPLANT
TUBING CONNECTING 10' (TUBING) ×1

## 2016-09-11 NOTE — Anesthesia Preprocedure Evaluation (Signed)
Anesthesia Evaluation    History of Anesthesia Complications (+) PONV  Airway Mallampati: II       Dental  (+) Edentulous Upper, Edentulous Lower   Pulmonary former smoker,    Pulmonary exam normal breath sounds clear to auscultation       Cardiovascular hypertension, Pt. on medications Normal cardiovascular exam+ dysrhythmias Atrial Fibrillation  Rhythm:Regular Rate:Normal     Neuro/Psych    GI/Hepatic GERD  Medicated and Controlled,  Endo/Other    Renal/GU Renal InsufficiencyRenal diseasenegative Renal ROS     Musculoskeletal   Abdominal Normal abdominal exam  (+)   Peds  Hematology  (+) Blood dyscrasia, anemia ,   Anesthesia Other Findings Study Result   Result status: Final result [employer] [facility]*            [address]             [place], [place] [address]               [phone]  -------------------------------------------------------------------- Echocardiography  Patient:  [name], [name] MR #:    [id number] Study Date: 07/24/2008 Gender:   F Age:    81 Height:   165.1cm Weight:   54kg BSA:    1.3m^2 Pt. Status: Room:  SONOGRAPHER Charlann Noss, RDCS ORDERING   Kirk Ruths, MD, [place], MD, [place]  [place], Office cc:  -------------------------------------------------------------------- Indications:  Atrial fibrillation - 427.31.  -------------------------------------------------------------------- History: PMH: Syncope. Chronic obstructive pulmonary disease. Risk factors: Former tobacco use. Hypertension.  -------------------------------------------------------------------- Study Conclusions Left ventricle: The cavity size was normal. Wall thickness was normal. Systolic function was  normal. The estimated ejection fraction was in the range of 55% to 60%. Doppler parameters are consistent with abnormal left ventricular relaxation (grade 1 diastolic dysfunction).  Echocardiography. M-mode, complete 2D, spectral Doppler, and color Doppler. Height: Height: 165.1cm. Height: 65in. Weight: Weight: 54kg. Weight: 118.8lb. Body mass index: BMI: 19.8kg/m^2. Body surface area: BSA: 1.42m^2. Patient status: Outpatient. Location: [place]     Reproductive/Obstetrics                             Anesthesia Physical  Anesthesia Plan  ASA: III  Anesthesia Plan: General   Post-op Pain Management:    Induction: Intravenous  PONV Risk Score and Plan: 4 or greater and Ondansetron, Dexamethasone, Propofol, Midazolam and Treatment may vary due to age  Airway Management Planned: LMA  Additional Equipment:   Intra-op Plan:   Post-operative Plan:   Informed Consent: I have reviewed the patients History and Physical, chart, labs and discussed the procedure including the risks, benefits and alternatives for the proposed anesthesia with the patient or authorized representative who has indicated his/her understanding and acceptance.     Plan Discussed with: CRNA and Surgeon  Anesthesia Plan Comments:         Anesthesia Quick Evaluation

## 2016-09-11 NOTE — H&P (Signed)
Urology Admission H&P  Chief Complaint: right flank pain  History of Present Illness: MS Weatherholtz is a 81yo with a 51mm right ureteral calculus who is status post right ureteral stent placement  Past Medical History:  Diagnosis Date  . Anxiety   . Arthritis   . Atrial fibrillation ([place])   . B12 deficiency   . COPD (chronic obstructive pulmonary disease) ([place])   . Depression   . GERD (gastroesophageal reflux disease)   . Hypertension   . Osteoarthrosis, hand    both hands  . Peptic ulcer, unspecified site, unspecified as acute or chronic, without mention of hemorrhage, perforation, or obstruction   . Pneumonia   . PONV (postoperative nausea and vomiting)   . Vitamin D deficiency disease    Past Surgical History:  Procedure Laterality Date  . BLADDER SURGERY     Bladder tack and intestines  . CYSTOCELE REPAIR    . CYSTOSCOPY WITH STENT PLACEMENT Right 08/11/2016   Procedure: CYSTOSCOPY, URETERSCOPY,  RIGHT RETROGRADE WITH RIGHT STENT PLACEMENT;  Surgeon: Festus Aloe, MD;  Location: [facility];  Service: Urology;  Laterality: Right;  . SPLENECTOMY      Home Medications:  Prescriptions Prior to Admission  Medication Sig Dispense Refill Last Dose  . Aspirin-Salicylamide-Caffeine (BC HEADACHE POWDER PO) Take 1 packet by mouth daily as needed (headaches).   Past Week at Unknown time  . Biotin 1000 MCG tablet Take 1,000 mcg by mouth daily.   Past Week at Unknown time  . Cholecalciferol (CVS VITAMIN D3) 1000 UNITS capsule Take 1,000 Units by mouth daily.     Past Week at Unknown time  . clonazePAM (KLONOPIN) 0.5 MG tablet TAKE 1 OR 2 TABLETS AT BEDTIME (Patient taking differently: TAKE 1 TABLET AT BEDTIME AS NEEDED FOR LEG CRAMPS) 60 tablet 1 Past Week at Unknown time  . cyanocobalamin 1000 MCG tablet Take 1,000 mcg by mouth daily.   Past Week at Unknown time  . flecainide (TAMBOCOR) 50 MG tablet TAKE 1 TABLET BY MOUTH TWICE A DAY 60 tablet 6 09/11/2016 at 0800  . loperamide (IMODIUM) 2 MG  capsule Take 1 capsule (2 mg total) by mouth 3 (three) times daily as needed for diarrhea or loose stools. 30 capsule 0 Past Month at Unknown time  . LORazepam (ATIVAN) 1 MG tablet TAKE 1 OR 2 TABLETS AT BEDTIME AS NEEDED FOR SLEEP OR ANXIETY 60 tablet 1 Past Month at Unknown time  . metoprolol tartrate (LOPRESSOR) 25 MG tablet Take 0.5 tablets (12.5 mg total) by mouth 2 (two) times daily. 60 tablet 2 09/11/2016 at 0800  . omeprazole (PRILOSEC) 20 MG capsule Take 20 mg by mouth daily.   09/11/2016 at 0800  . umeclidinium-vilanterol (ANORO ELLIPTA) 62.5-25 MCG/INH AEPB Inhale 1 puff into the lungs daily. 1 each 5 09/10/2016 at 1200  . albuterol (PROVENTIL HFA;VENTOLIN HFA) 108 (90 Base) MCG/ACT inhaler Inhale 2 puffs into the lungs every 6 (six) hours as needed for wheezing or shortness of breath. 1 Inhaler 2 Taking  . cephALEXin (KEFLEX) 500 MG capsule Take 1 capsule (500 mg total) by mouth 3 (three) times daily. 21 capsule 0 Taking  . cyanocobalamin (V-R VITAMIN B-12) 500 MCG tablet Take 1 tablet (500 mcg total) by mouth daily. 100 tablet 3 Taking  . saccharomyces boulardii (FLORASTOR) 250 MG capsule Take 1 capsule (250 mg total) by mouth 2 (two) times daily. 30 capsule 0 Taking   Allergies:  Allergies  Allergen Reactions  . Amoxicillin Other (See Comments)    "  makes me crazy" Has patient had a PCN reaction causing immediate rash, facial/tongue/throat swelling, SOB or lightheadedness with hypotension: No Has patient had a PCN reaction causing severe rash involving mucus membranes or skin necrosis: No Has patient had a PCN reaction that required hospitalization: No Has patient had a PCN reaction occurring within the last 10 years: No If all of the above answers are "NO", then may proceed with Cephalosporin use.   . Codeine Nausea And Vomiting  . Levaquin [Levofloxacin In D5w] Nausea And Vomiting  . Other Nausea And Vomiting    Pt does not want to take any pain meds    Family History  Problem  Relation Age of Onset  . Heart disease Father   . Arthritis Unknown        Family history of  . Coronary artery disease Unknown        Family history of  . Hypertension Unknown        Family history of  . Diabetes Daughter        19 died   Social History:  reports that she has quit smoking. She has never used smokeless tobacco. She reports that she does not drink alcohol or use drugs.  Review of Systems  Genitourinary: Positive for flank pain and hematuria.  All other systems reviewed and are negative.   Physical Exam:  Vital signs in last 24 hours: Temp:  [97.9 F (36.6 C)] 97.9 F (36.6 C) (07/09 1145) Pulse Rate:  [63] 63 (07/09 1145) Resp:  [16] 16 (07/09 1145) BP: (113)/(57) 113/57 (07/09 1145) SpO2:  [98 %] 98 % (07/09 1145) Weight:  [50.8 kg (112 lb)] 50.8 kg (112 lb) (07/09 1240) Physical Exam  Constitutional: She is oriented to person, place, and time. She appears well-developed and well-nourished.  HENT:  Head: Normocephalic and atraumatic.  Eyes: EOM are normal. Pupils are equal, round, and reactive to light.  Neck: Normal range of motion. No thyromegaly present.  Cardiovascular: Normal rate and regular rhythm.   Respiratory: Effort normal. No respiratory distress.  GI: Soft. She exhibits no distension.  Musculoskeletal: Normal range of motion. She exhibits no edema.  Neurological: She is alert and oriented to person, place, and time.  Skin: Skin is warm and dry.  Psychiatric: She has a normal mood and affect. Her behavior is normal. Judgment and thought content normal.    Laboratory Data:  No results found for this or any previous visit (from the past 24 hour(s)). No results found for this or any previous visit (from the past 240 hour(s)). Creatinine: No results for input(s): CREATININE in the last 168 hours. Baseline Creatinine: unkwnon  Impression/Assessment:  80yo with right ureteral calculus  Plan:  The risks/benefits/alternatives to rigth URS was  explained to the patient and she udnerstands and wishes to proceed with surgery  Nicolette Bang 09/11/2016, 1:54 PM

## 2016-09-11 NOTE — Brief Op Note (Signed)
09/11/2016  2:55 PM  PATIENT:  [name]  81 y.o. female  PRE-OPERATIVE DIAGNOSIS:  RIGHT URETERAL CALCULUS  POST-OPERATIVE DIAGNOSIS:  RIGHT URETERAL CALCULUS  PROCEDURE:  Procedure(s): CYSTOSCOPY WITH RETROGRADE PYELOGRAM, URETEROSCOPY AND STENT REPLACEMENT (Right)  SURGEON:  Surgeon(s) and Role:    * Bryse Blanchette, Candee Furbish, MD - Primary  PHYSICIAN ASSISTANT:   ASSISTANTS: none   ANESTHESIA:   general  EBL:  No intake/output data recorded.  BLOOD ADMINISTERED:none  DRAINS: right 6x24 JJ ureteral stent without tether   LOCAL MEDICATIONS USED:  NONE  SPECIMEN:  Source of Specimen:  right ureteral calculus  DISPOSITION OF SPECIMEN:  N/A  COUNTS:  YES  TOURNIQUET:  * No tourniquets in log *  DICTATION: .Note written in EPIC  PLAN OF CARE: Admit for overnight observation  PATIENT DISPOSITION:  PACU - hemodynamically stable.   Delay start of Pharmacological VTE agent (>24hrs) due to surgical blood loss or risk of bleeding: not applicable

## 2016-09-11 NOTE — Anesthesia Procedure Notes (Signed)
Procedure Name: LMA Insertion Date/Time: 09/11/2016 2:18 PM Performed by: Enrigue Catena E Pre-anesthesia Checklist: Patient identified, Emergency Drugs available, Suction available and Patient being monitored Patient Re-evaluated:Patient Re-evaluated prior to inductionOxygen Delivery Method: Circle system utilized Preoxygenation: Pre-oxygenation with 100% oxygen Intubation Type: IV induction Ventilation: Mask ventilation without difficulty LMA: LMA with gastric port inserted LMA Size: 3.0 Tube type: Oral Number of attempts: 1 Airway Equipment and Method: Oral airway Placement Confirmation: positive ETCO2 Tube secured with: Tape Dental Injury: Teeth and Oropharynx as per pre-operative assessment

## 2016-09-11 NOTE — Transfer of Care (Signed)
Immediate Anesthesia Transfer of Care Note  Patient: [name]  Procedure(s) Performed: Procedure(s): CYSTOSCOPY WITH RETROGRADE PYELOGRAM, URETEROSCOPY AND STENT REPLACEMENT (Right)  Patient Location: PACU  Anesthesia Type:General  Level of Consciousness: awake, alert , oriented and patient cooperative  Airway & Oxygen Therapy: Patient Spontanous Breathing and Patient connected to face mask oxygen  Post-op Assessment: Report given to RN, Post -op Vital signs reviewed and stable and Patient moving all extremities X 4  Post vital signs: stable  Last Vitals:  Vitals:   09/11/16 1145  BP: (!) 113/57  Pulse: 63  Resp: 16  Temp: 36.6 C    Last Pain:  Vitals:   09/11/16 1145  TempSrc: Oral         Complications: No apparent anesthesia complications

## 2016-09-11 NOTE — Anesthesia Postprocedure Evaluation (Signed)
Anesthesia Post Note  Patient: [name]  Procedure(s) Performed: Procedure(s) (LRB): CYSTOSCOPY WITH RETROGRADE PYELOGRAM, URETEROSCOPY AND STENT REPLACEMENT (Right)     Patient location during evaluation: PACU Anesthesia Type: General Level of consciousness: awake and alert Pain management: pain level controlled Vital Signs Assessment: post-procedure vital signs reviewed and stable Respiratory status: spontaneous breathing, nonlabored ventilation, respiratory function stable and patient connected to nasal cannula oxygen Cardiovascular status: blood pressure returned to baseline and stable Postop Assessment: no signs of nausea or vomiting Anesthetic complications: no    Last Vitals:  Vitals:   09/11/16 1600 09/11/16 1615  BP: (!) 169/58 (!) 160/78  Pulse: (!) 58 73  Resp: (!) 7 12  Temp: 36.6 C 36.6 C    Last Pain:  Vitals:   09/11/16 1600  TempSrc:   PainSc: Asleep                 Montez Hageman

## 2016-09-12 DIAGNOSIS — J449 Chronic obstructive pulmonary disease, unspecified: Secondary | ICD-10-CM | POA: Diagnosis not present

## 2016-09-12 DIAGNOSIS — K219 Gastro-esophageal reflux disease without esophagitis: Secondary | ICD-10-CM | POA: Diagnosis not present

## 2016-09-12 DIAGNOSIS — I10 Essential (primary) hypertension: Secondary | ICD-10-CM | POA: Diagnosis not present

## 2016-09-12 DIAGNOSIS — Z87891 Personal history of nicotine dependence: Secondary | ICD-10-CM | POA: Diagnosis not present

## 2016-09-12 DIAGNOSIS — E559 Vitamin D deficiency, unspecified: Secondary | ICD-10-CM | POA: Diagnosis not present

## 2016-09-12 DIAGNOSIS — I4891 Unspecified atrial fibrillation: Secondary | ICD-10-CM | POA: Diagnosis not present

## 2016-09-12 DIAGNOSIS — Z79899 Other long term (current) drug therapy: Secondary | ICD-10-CM | POA: Diagnosis not present

## 2016-09-12 DIAGNOSIS — E538 Deficiency of other specified B group vitamins: Secondary | ICD-10-CM | POA: Diagnosis not present

## 2016-09-12 DIAGNOSIS — N200 Calculus of kidney: Secondary | ICD-10-CM | POA: Diagnosis not present

## 2016-09-12 MED ORDER — HYDRALAZINE HCL 20 MG/ML IJ SOLN
10.0000 mg | Freq: Four times a day (QID) | INTRAMUSCULAR | Status: DC | PRN
  Administered 2016-09-12: 10 mg via INTRAVENOUS
  Filled 2016-09-12: qty 1
  Filled 2016-09-12: qty 0.5

## 2016-09-12 MED ORDER — HYDROCODONE-ACETAMINOPHEN 5-325 MG PO TABS: 1.0000 | ORAL_TABLET | ORAL | 0 refills | Status: DC | PRN

## 2016-09-12 NOTE — Discharge Instructions (Signed)

## 2016-09-12 NOTE — Progress Notes (Signed)
Pt's BP 197/67. Scheduled metoprolol brought BP down to 184/70. On call urologist paged and placed new order for 10 mg of hydralazine. Follow up BP 143/56. Pt also continuing to vomit during shift, however is refusing to take any antiemetic for it. Will continue to monitor.

## 2016-09-14 NOTE — Op Note (Signed)
.  Preoperative diagnosis: right renal calculus  Postoperative diagnosis: Same  Procedure: 1 cystoscopy 2. Right retrograde pyelography 3.  Intraoperative fluoroscopy, under one hour, with interpretation 4.  right ureteroscopic stone manipulation with basket extraction  5.  right 6 x 24 JJ stent exchange  Attending: Rosie Fate  Anesthesia: General  Estimated blood loss: None  Drains: bilateral 6 x 24 JJ ureteral stent without tether  Specimens: stone for analysis  Antibiotics: ancef  Findings: right 19mm lower pole renal calculi. No hydronephrosis. No masses/lesions in the bladder. Ureteral orifices in normal anatomic location.  Indications: Patient is a 81 year old female with a history of right renal calculi and flank pain status post right ureteral stent placement. After discussing treatment options, they decided proceed with right ureteroscopic stone manipulation.  Procedure her in detail: The patient was brought to the operating room and a brief timeout was done to ensure correct patient, correct procedure, correct site.  General anesthesia was administered patient was placed in dorsal lithotomy position.  Her genitalia was then prepped and draped in usual sterile fashion.  A rigid 23 French cystoscope was passed in the urethra and the bladder.  Bladder was inspected free masses or lesions.  the ureteral orifices were in the normal orthotopic locations. Using a grasper the ureteral stent was brought to the urethral meatus.  We then advanced a zipwire through the catheter and up to the renal pelvis.  we then removed the cystoscope and cannulated the left ureteral orifice with a semirigid ureteroscope.  We located no stone in the ureter. We then placed a sensor wire up to the renal pelvis. We removed the scope and advanced a 12/14 x 38cm access sheath up to the renal pelvis. We then used the flexible ureteroscope to perform nephroscopy. We located calculi int he mid and lower poles  which were removed with an NGage basket. Once the stone were removed we then removed the access sheath under direct vision and noted to injury to the ureter. we then placed a 6 x 24 double-j ureteral stent over the original zip wire.  We then removed the wire and good coil was noted in the the renal pelvis under fluoroscopy and the bladder under direct vision.   the bladder was then drained and this concluded the procedure which was well tolerated by patient.  Complications: None  Condition: Stable, extubated, transferred to PACU  Plan: Patient is to be discharged home as to follow-up in 2 weeks for stent removal

## 2016-09-15 ENCOUNTER — Telehealth: Payer: Self-pay | Admitting: Internal Medicine

## 2016-09-15 NOTE — Telephone Encounter (Signed)
Pt called in and need refill on her LORazepam (ATIVAN) 1 MG tablet [[id number]]    [facility] on file

## 2016-09-15 NOTE — Telephone Encounter (Signed)
RX called in .

## 2016-09-19 DIAGNOSIS — N200 Calculus of kidney: Secondary | ICD-10-CM | POA: Diagnosis not present

## 2016-09-21 DIAGNOSIS — N200 Calculus of kidney: Secondary | ICD-10-CM | POA: Diagnosis not present

## 2016-09-24 NOTE — Discharge Summary (Signed)
Physician Discharge Summary  Patient ID: [name] MRN: [id number] DOB/AGE: 79-May-1937 79 y.o.  Admit date: 09/11/2016 Discharge date: 09/12/2016  Admission Diagnoses: intractable flank pain   Discharge Diagnoses: same  Hospital Course: The patient tolerated the procedure well and was transferred to the floor on IV pain meds, IV fluid. On POD#1 the pt was tolerating a regular diet, pain was controlled on PO pain meds, they were ambulating without difficulty, and they had normal bowel function.   Discharge Exam: Blood pressure (!) 132/52, pulse (!) 115, temperature 97.8 F (36.6 C), temperature source Oral, resp. rate 18, height 5\' 3"  (1.6 m), weight 54.5 kg (120 lb 2.4 oz), SpO2 92 %. General appearance: alert, cooperative and appears stated age Head: Normocephalic, without obvious abnormality, atraumatic Neck: no adenopathy, no carotid bruit, no JVD, supple, symmetrical, trachea midline and thyroid not enlarged, symmetric, no tenderness/mass/nodules Resp: clear to auscultation bilaterally Cardio: regular rate and rhythm, S1, S2 normal, no murmur, click, rub or gallop Extremities: extremities normal, atraumatic, no cyanosis or edema Neurologic: Grossly normal  Disposition: 01-Home or Self Care  Discharge Instructions    Discharge patient    Complete by:  As directed    Discharge disposition:  01-Home or Self Care   Discharge patient date:  09/12/2016     Allergies as of 09/12/2016      Reactions   Amoxicillin Other (See Comments)   "makes me crazy" Has patient had a PCN reaction causing immediate rash, facial/tongue/throat swelling, SOB or lightheadedness with hypotension: No Has patient had a PCN reaction causing severe rash involving mucus membranes or skin necrosis: No Has patient had a PCN reaction that required hospitalization: No Has patient had a PCN reaction occurring within the last 10 years: No If all of the above answers are "NO", then may proceed with  Cephalosporin use.   Codeine Nausea And Vomiting   Levaquin [levofloxacin In D5w] Nausea And Vomiting   Other Nausea And Vomiting   Pt does not want to take any pain meds      Medication List    TAKE these medications   albuterol 108 (90 Base) MCG/ACT inhaler Commonly known as:  PROVENTIL HFA;VENTOLIN HFA Inhale 2 puffs into the lungs every 6 (six) hours as needed for wheezing or shortness of breath.   BC HEADACHE POWDER PO Take 1 packet by mouth daily as needed (headaches).   Biotin 1000 MCG tablet Take 1,000 mcg by mouth daily.   cephALEXin 500 MG capsule Commonly known as:  KEFLEX Take 1 capsule (500 mg total) by mouth 3 (three) times daily.   clonazePAM 0.5 MG tablet Commonly known as:  KLONOPIN TAKE 1 OR 2 TABLETS AT BEDTIME What changed:  See the new instructions.   [employer] VITAMIN D3 1000 units capsule Generic drug:  Cholecalciferol Take 1,000 Units by mouth daily.   cyanocobalamin 1000 MCG tablet Take 1,000 mcg by mouth daily.   cyanocobalamin 500 MCG tablet Commonly known as:  V-R VITAMIN B-12 Take 1 tablet (500 mcg total) by mouth daily.   flecainide 50 MG tablet Commonly known as:  TAMBOCOR TAKE 1 TABLET BY MOUTH TWICE A DAY   HYDROcodone-acetaminophen 5-325 MG tablet Commonly known as:  NORCO/VICODIN Take 1-2 tablets by mouth every 4 (four) hours as needed for moderate pain.   loperamide 2 MG capsule Commonly known as:  IMODIUM Take 1 capsule (2 mg total) by mouth 3 (three) times daily as needed for diarrhea or loose stools.   LORazepam 1 MG  tablet Commonly known as:  ATIVAN TAKE 1 OR 2 TABLETS AT BEDTIME AS NEEDED FOR SLEEP OR ANXIETY   metoprolol tartrate 25 MG tablet Commonly known as:  LOPRESSOR Take 0.5 tablets (12.5 mg total) by mouth 2 (two) times daily.   omeprazole 20 MG capsule Commonly known as:  PRILOSEC Take 20 mg by mouth daily.   saccharomyces boulardii 250 MG capsule Commonly known as:  FLORASTOR Take 1 capsule (250 mg total)  by mouth 2 (two) times daily.   umeclidinium-vilanterol 62.5-25 MCG/INH Aepb Commonly known as:  ANORO ELLIPTA Inhale 1 puff into the lungs daily.      Follow-up Information    Jodean Valade, Candee Furbish, MD. Call in 1 week(s).   Specialty:  Urology Why:  stent removal Contact information: [place] [address] [phone]           Signed: Nicolette Bang 09/24/2016, 8:33 AM

## 2016-09-28 ENCOUNTER — Ambulatory Visit: Payer: Self-pay | Admitting: Family Medicine

## 2016-09-29 ENCOUNTER — Encounter (HOSPITAL_COMMUNITY): Payer: Self-pay | Admitting: Family Medicine

## 2016-09-29 ENCOUNTER — Ambulatory Visit (HOSPITAL_COMMUNITY)
Admission: EM | Admit: 2016-09-29 | Discharge: 2016-09-29 | Disposition: A | Discharge status: Home / Self Care | Payer: Medicare Other | Attending: Family Medicine | Admitting: Family Medicine

## 2016-09-29 DIAGNOSIS — K219 Gastro-esophageal reflux disease without esophagitis: Secondary | ICD-10-CM | POA: Diagnosis not present

## 2016-09-29 DIAGNOSIS — Z7982 Long term (current) use of aspirin: Secondary | ICD-10-CM | POA: Diagnosis not present

## 2016-09-29 DIAGNOSIS — N390 Urinary tract infection, site not specified: Secondary | ICD-10-CM | POA: Diagnosis not present

## 2016-09-29 DIAGNOSIS — Z880 Allergy status to penicillin: Secondary | ICD-10-CM | POA: Insufficient documentation

## 2016-09-29 DIAGNOSIS — Z8711 Personal history of peptic ulcer disease: Secondary | ICD-10-CM | POA: Insufficient documentation

## 2016-09-29 DIAGNOSIS — Z8249 Family history of ischemic heart disease and other diseases of the circulatory system: Secondary | ICD-10-CM | POA: Insufficient documentation

## 2016-09-29 DIAGNOSIS — Z79899 Other long term (current) drug therapy: Secondary | ICD-10-CM | POA: Diagnosis not present

## 2016-09-29 DIAGNOSIS — Z9889 Other specified postprocedural states: Secondary | ICD-10-CM | POA: Insufficient documentation

## 2016-09-29 DIAGNOSIS — R42 Dizziness and giddiness: Secondary | ICD-10-CM | POA: Diagnosis not present

## 2016-09-29 DIAGNOSIS — Z87891 Personal history of nicotine dependence: Secondary | ICD-10-CM | POA: Diagnosis not present

## 2016-09-29 DIAGNOSIS — R5383 Other fatigue: Secondary | ICD-10-CM

## 2016-09-29 DIAGNOSIS — R531 Weakness: Secondary | ICD-10-CM | POA: Insufficient documentation

## 2016-09-29 DIAGNOSIS — Z833 Family history of diabetes mellitus: Secondary | ICD-10-CM | POA: Diagnosis not present

## 2016-09-29 DIAGNOSIS — J449 Chronic obstructive pulmonary disease, unspecified: Secondary | ICD-10-CM | POA: Insufficient documentation

## 2016-09-29 LAB — POCT URINALYSIS DIP (DEVICE)
Bilirubin Urine: NEGATIVE
Glucose, UA: NEGATIVE mg/dL
Hgb urine dipstick: NEGATIVE
Ketones, ur: NEGATIVE mg/dL
Nitrite: NEGATIVE
Protein, ur: NEGATIVE mg/dL
Specific Gravity, Urine: 1.015 (ref 1.005–1.030)
Urobilinogen, UA: 0.2 mg/dL (ref 0.0–1.0)
pH: 6 (ref 5.0–8.0)

## 2016-09-29 MED ORDER — NITROFURANTOIN MONOHYD MACRO 100 MG PO CAPS: 100.0000 mg | ORAL_CAPSULE | Freq: Two times a day (BID) | ORAL | 0 refills | Status: DC

## 2016-09-29 NOTE — ED Triage Notes (Signed)
Pt here for left ear problem and dizziness. sts she cant hear out of ear.

## 2016-09-29 NOTE — ED Provider Notes (Signed)
CSN: [id number]     Arrival date & time 09/29/16  1043 History   None    Chief Complaint  Patient presents with  . Ear Fullness  . Dizziness   (Consider location/radiation/quality/duration/timing/severity/associated sxs/prior Treatment) Patient c/o left ear fullness and dizziness.  She states it may be her anemia.  She has been feeling weak.   The history is provided by the patient.  Ear Fullness  This is a new problem. The problem occurs constantly. The problem has not changed since onset.Nothing aggravates the symptoms. Nothing relieves the symptoms. She has tried nothing for the symptoms.  Dizziness    Past Medical History:  Diagnosis Date  . Anxiety   . Arthritis   . Atrial fibrillation ([place])   . B12 deficiency   . COPD (chronic obstructive pulmonary disease) ([place])   . Depression   . GERD (gastroesophageal reflux disease)   . Hypertension   . Osteoarthrosis, hand    both hands  . Peptic ulcer, unspecified site, unspecified as acute or chronic, without mention of hemorrhage, perforation, or obstruction   . Pneumonia   . PONV (postoperative nausea and vomiting)   . Vitamin D deficiency disease    Past Surgical History:  Procedure Laterality Date  . BLADDER SURGERY     Bladder tack and intestines  . CYSTOCELE REPAIR    . CYSTOSCOPY WITH RETROGRADE PYELOGRAM, URETEROSCOPY AND STENT PLACEMENT Right 09/11/2016   Procedure: CYSTOSCOPY WITH RETROGRADE PYELOGRAM, URETEROSCOPY AND STENT REPLACEMENT;  Surgeon: Cleon Gustin, MD;  Location: [facility];  Service: Urology;  Laterality: Right;  . CYSTOSCOPY WITH STENT PLACEMENT Right 08/11/2016   Procedure: CYSTOSCOPY, URETERSCOPY,  RIGHT RETROGRADE WITH RIGHT STENT PLACEMENT;  Surgeon: Festus Aloe, MD;  Location: [facility];  Service: Urology;  Laterality: Right;  . SPLENECTOMY     Family History  Problem Relation Age of Onset  . Heart disease Father   . Arthritis Unknown        Family history of  . Coronary artery disease  Unknown        Family history of  . Hypertension Unknown        Family history of  . Diabetes Daughter        38 died   Social History  Substance Use Topics  . Smoking status: Former Research scientist (life sciences)  . Smokeless tobacco: Never Used     Comment: stopped 20 years   . Alcohol use No   OB History    No data available     Review of Systems  Constitutional: Positive for fatigue.  HENT: Negative for congestion.   Eyes: Negative.   Respiratory: Negative.   Cardiovascular: Negative.   Gastrointestinal: Negative.   Endocrine: Negative.   Genitourinary: Negative.   Musculoskeletal: Negative.   Neurological: Positive for dizziness.  Hematological: Negative.   Psychiatric/Behavioral: Negative.     Allergies  Amoxicillin; Codeine; Levaquin [levofloxacin in d5w]; and Other  Home Medications   Prior to Admission medications   Medication Sig Start Date End Date Taking? Authorizing Provider  albuterol (PROVENTIL HFA;VENTOLIN HFA) 108 (90 Base) MCG/ACT inhaler Inhale 2 puffs into the lungs every 6 (six) hours as needed for wheezing or shortness of breath. 08/23/16   Burns, Claudina Lick, MD  Aspirin-Salicylamide-Caffeine (BC HEADACHE POWDER PO) Take 1 packet by mouth daily as needed (headaches).    [provider]  Biotin 1000 MCG tablet Take 1,000 mcg by mouth daily.    [provider]  cephALEXin (KEFLEX) 500 MG capsule Take  1 capsule (500 mg total) by mouth 3 (three) times daily. 08/25/16   Binnie Rail, MD  Cholecalciferol (CVS VITAMIN D3) 1000 UNITS capsule Take 1,000 Units by mouth daily.      [provider]  clonazePAM (KLONOPIN) 0.5 MG tablet TAKE 1 OR 2 TABLETS AT BEDTIME Patient taking differently: TAKE 1 TABLET AT BEDTIME AS NEEDED FOR LEG CRAMPS 08/17/16   Plotnikov, Evie Lacks, MD  cyanocobalamin (V-R VITAMIN B-12) 500 MCG tablet Take 1 tablet (500 mcg total) by mouth daily. 09/17/15   Plotnikov, Evie Lacks, MD  cyanocobalamin 1000 MCG tablet Take 1,000 mcg by mouth  daily.    [provider]  flecainide (TAMBOCOR) 50 MG tablet TAKE 1 TABLET BY MOUTH TWICE A DAY 08/21/16   Lelon Perla, MD  HYDROcodone-acetaminophen (NORCO/VICODIN) 5-325 MG tablet Take 1-2 tablets by mouth every 4 (four) hours as needed for moderate pain. 09/12/16   McKenzie, Candee Furbish, MD  loperamide (IMODIUM) 2 MG capsule Take 1 capsule (2 mg total) by mouth 3 (three) times daily as needed for diarrhea or loose stools. 08/17/16   Gherghe, Vella Redhead, MD  LORazepam (ATIVAN) 1 MG tablet TAKE 1 OR 2 TABLETS AT BEDTIME AS NEEDED FOR SLEEP OR ANXIETY 08/30/16   Plotnikov, Evie Lacks, MD  metoprolol tartrate (LOPRESSOR) 25 MG tablet Take 0.5 tablets (12.5 mg total) by mouth 2 (two) times daily. 08/31/16   Plotnikov, Evie Lacks, MD  nitrofurantoin, macrocrystal-monohydrate, (MACROBID) 100 MG capsule Take 1 capsule (100 mg total) by mouth 2 (two) times daily. 09/29/16   Lysbeth Penner, FNP  omeprazole (PRILOSEC) 20 MG capsule Take 20 mg by mouth daily.    [provider]  saccharomyces boulardii (FLORASTOR) 250 MG capsule Take 1 capsule (250 mg total) by mouth 2 (two) times daily. 08/17/16   Caren Griffins, MD  umeclidinium-vilanterol (ANORO ELLIPTA) 62.5-25 MCG/INH AEPB Inhale 1 puff into the lungs daily. 04/03/16   Plotnikov, Evie Lacks, MD   Meds Ordered and Administered this Visit  Medications - No data to display  BP (!) 169/72   Pulse 64   Temp 97.8 F (36.6 C)   Resp 18   SpO2 97%  No data found.   Physical Exam  Constitutional: She appears well-developed and well-nourished.  HENT:  Head: Normocephalic and atraumatic.  Eyes: Pupils are equal, round, and reactive to light. Conjunctivae and EOM are normal.  Neck: Normal range of motion. Neck supple.  Cardiovascular: Normal rate, regular rhythm and normal heart sounds.   Pulmonary/Chest: Effort normal and breath sounds normal.  Abdominal: Soft. Bowel sounds are normal.  Nursing note and vitals reviewed.   Urgent  Care Course     Procedures (including critical care time)  Labs Review Labs Reviewed  POCT URINALYSIS DIP (DEVICE) - Abnormal; Notable for the following:       Result Value   Leukocytes, UA SMALL (*)    All other components within normal limits  URINE CULTURE    Imaging Review No results found.   Visual Acuity Review  Right Eye Distance:   Left Eye Distance:   Bilateral Distance:    Right Eye Near:   Left Eye Near:    Bilateral Near:         MDM   1. Urinary tract infection without hematuria, site unspecified    Macrobid 100mg  one po bid x 5 days #10  Patient refuses I stat chem 8   Push po fluids, rest, tylenol and motrin otc  prn as directed for fever, arthralgias, and myalgias.  Follow up prn if sx's continue or persist.    Lysbeth Penner, FNP 09/29/16 1219

## 2016-09-30 LAB — URINE CULTURE

## 2016-10-11 ENCOUNTER — Telehealth: Payer: Self-pay | Admitting: Internal Medicine

## 2016-10-11 NOTE — Telephone Encounter (Signed)
[facility]  Patient Name: [name]  DOB: 1935/11/07    Initial Comment Caller states she is having dizziness and has a UTI.   Nurse Assessment      Guidelines    Guideline Title Affirmed Question Affirmed Notes       Final Disposition User        Comments  Primary phone not working, secondary is fax #. Sent back to [facility]. and put in personal Q  Both numbers correct per recording.  Unable to reach this caller. When calling the primary # there is a recording which states it is not a working #. The secondary # has you enter a # proving you are not a robot, I did that, and then I get the Fax sound. I did each several times and then messaged the PC. She checked and both #'s are good. I put a quick note in there saying I had messaged her. Unable to reach patient to complete triage.

## 2016-10-13 ENCOUNTER — Telehealth: Payer: Self-pay | Admitting: Internal Medicine

## 2016-10-13 NOTE — Telephone Encounter (Signed)
Pt called for a refill of clonazePAM (KLONOPIN) 0.5 MG tablet  Had [facility] on 08/2016 with Burns  Please advise

## 2016-10-16 NOTE — Telephone Encounter (Signed)
OK to fill this/these prescription(s) with additional refills x2 Needs to have an OV every 3 months Thank you!  

## 2016-10-17 MED ORDER — CLONAZEPAM 0.5 MG PO TABS: ORAL_TABLET | ORAL | 2 refills | Status: DC

## 2016-10-17 NOTE — Telephone Encounter (Signed)
Notified pt MD ok refill and has been called into [facility]...[name]

## 2016-10-31 ENCOUNTER — Ambulatory Visit (INDEPENDENT_AMBULATORY_CARE_PROVIDER_SITE_OTHER): Payer: Medicare Other | Admitting: Internal Medicine

## 2016-10-31 ENCOUNTER — Encounter: Payer: Self-pay | Admitting: Internal Medicine

## 2016-10-31 VITALS — BP 138/76 | HR 54 | Temp 97.9°F | Ht 63.0 in | Wt 116.0 lb

## 2016-10-31 DIAGNOSIS — J449 Chronic obstructive pulmonary disease, unspecified: Secondary | ICD-10-CM

## 2016-10-31 DIAGNOSIS — I10 Essential (primary) hypertension: Secondary | ICD-10-CM | POA: Diagnosis not present

## 2016-10-31 DIAGNOSIS — E538 Deficiency of other specified B group vitamins: Secondary | ICD-10-CM

## 2016-10-31 DIAGNOSIS — I480 Paroxysmal atrial fibrillation: Secondary | ICD-10-CM | POA: Diagnosis not present

## 2016-10-31 DIAGNOSIS — Z23 Encounter for immunization: Secondary | ICD-10-CM

## 2016-10-31 DIAGNOSIS — J4489 Other specified chronic obstructive pulmonary disease: Secondary | ICD-10-CM

## 2016-10-31 DIAGNOSIS — H6120 Impacted cerumen, unspecified ear: Secondary | ICD-10-CM

## 2016-10-31 DIAGNOSIS — R300 Dysuria: Secondary | ICD-10-CM

## 2016-10-31 DIAGNOSIS — R42 Dizziness and giddiness: Secondary | ICD-10-CM | POA: Insufficient documentation

## 2016-10-31 MED ORDER — LORAZEPAM 1 MG PO TABS: ORAL_TABLET | ORAL | 1 refills | Status: DC

## 2016-10-31 NOTE — Assessment & Plan Note (Signed)
Doing ok.

## 2016-10-31 NOTE — Assessment & Plan Note (Signed)
ASA, Flecainide

## 2016-10-31 NOTE — Assessment & Plan Note (Signed)
Norvasc

## 2016-10-31 NOTE — Patient Instructions (Signed)
MC well w/Jill 

## 2016-10-31 NOTE — Assessment & Plan Note (Signed)
B wax - hurts to touch: ENT ref

## 2016-10-31 NOTE — Progress Notes (Signed)
Subjective:  Patient ID: [name], female    DOB: 09-12-1935  Age: 81 y.o. MRN: [id number]  CC: No chief complaint on file.   HPI [name] presents for COPD, wt loss, anxiety, B12 def f/u  Outpatient Medications Prior to Visit  Medication Sig Dispense Refill  . Aspirin-Salicylamide-Caffeine (BC HEADACHE POWDER PO) Take 1 packet by mouth daily as needed (headaches).    . Biotin 1000 MCG tablet Take 1,000 mcg by mouth daily.    . Cholecalciferol (CVS VITAMIN D3) 1000 UNITS capsule Take 1,000 Units by mouth daily.      . clonazePAM (KLONOPIN) 0.5 MG tablet TAKE 1 TABLET AT BEDTIME AS NEEDED FOR LEG CRAMPS 60 tablet 2  . cyanocobalamin 1000 MCG tablet Take 1,000 mcg by mouth daily.    . flecainide (TAMBOCOR) 50 MG tablet TAKE 1 TABLET BY MOUTH TWICE A DAY 60 tablet 6  . LORazepam (ATIVAN) 1 MG tablet TAKE 1 OR 2 TABLETS AT BEDTIME AS NEEDED FOR SLEEP OR ANXIETY 60 tablet 1  . metoprolol tartrate (LOPRESSOR) 25 MG tablet Take 0.5 tablets (12.5 mg total) by mouth 2 (two) times daily. 60 tablet 2  . omeprazole (PRILOSEC) 20 MG capsule Take 20 mg by mouth daily.    Marland Kitchen umeclidinium-vilanterol (ANORO ELLIPTA) 62.5-25 MCG/INH AEPB Inhale 1 puff into the lungs daily. 1 each 5  . albuterol (PROVENTIL HFA;VENTOLIN HFA) 108 (90 Base) MCG/ACT inhaler Inhale 2 puffs into the lungs every 6 (six) hours as needed for wheezing or shortness of breath. 1 Inhaler 2  . cephALEXin (KEFLEX) 500 MG capsule Take 1 capsule (500 mg total) by mouth 3 (three) times daily. 21 capsule 0  . cyanocobalamin (V-R VITAMIN B-12) 500 MCG tablet Take 1 tablet (500 mcg total) by mouth daily. 100 tablet 3  . HYDROcodone-acetaminophen (NORCO/VICODIN) 5-325 MG tablet Take 1-2 tablets by mouth every 4 (four) hours as needed for moderate pain. 30 tablet 0  . loperamide (IMODIUM) 2 MG capsule Take 1 capsule (2 mg total) by mouth 3 (three) times daily as needed for diarrhea or loose stools. 30 capsule 0  . nitrofurantoin,  macrocrystal-monohydrate, (MACROBID) 100 MG capsule Take 1 capsule (100 mg total) by mouth 2 (two) times daily. 10 capsule 0  . saccharomyces boulardii (FLORASTOR) 250 MG capsule Take 1 capsule (250 mg total) by mouth 2 (two) times daily. 30 capsule 0   No facility-administered medications prior to visit.     ROS Review of Systems  Constitutional: Positive for fatigue and unexpected weight change. Negative for activity change, appetite change and chills.  HENT: Negative for congestion, mouth sores and sinus pressure.   Eyes: Negative for visual disturbance.  Respiratory: Negative for cough and chest tightness.   Gastrointestinal: Negative for abdominal pain and nausea.  Genitourinary: Positive for dysuria. Negative for difficulty urinating, frequency and vaginal pain.  Musculoskeletal: Negative for back pain and gait problem.  Skin: Negative for pallor and rash.  Neurological: Positive for dizziness. Negative for tremors, weakness, numbness and headaches.  Psychiatric/Behavioral: Negative for confusion and sleep disturbance. The patient is nervous/anxious.     Objective:  BP 138/76 (BP Location: Left Arm, Patient Position: Sitting, Cuff Size: Normal)   Pulse (!) 54   Temp 97.9 F (36.6 C) (Oral)   Ht 5\' 3"  (1.6 m)   Wt 116 lb (52.6 kg)   SpO2 97%   BMI 20.55 kg/m   BP Readings from Last 3 Encounters:  10/31/16 138/76  09/29/16 (!) 169/72  09/12/16 (!) 132/52    Wt Readings from Last 3 Encounters:  10/31/16 116 lb (52.6 kg)  09/11/16 120 lb 2.4 oz (54.5 kg)  09/07/16 112 lb (50.8 kg)    Physical Exam  Constitutional: She appears well-developed. No distress.  HENT:  Head: Normocephalic.  Right Ear: External ear normal.  Left Ear: External ear normal.  Nose: Nose normal.  Mouth/Throat: Oropharynx is clear and moist.  Eyes: Pupils are equal, round, and reactive to light. Conjunctivae are normal. Right eye exhibits no discharge. Left eye exhibits no discharge.  Neck:  Normal range of motion. Neck supple. No JVD present. No tracheal deviation present. No thyromegaly present.  Cardiovascular: Normal rate, regular rhythm and normal heart sounds.   Pulmonary/Chest: No stridor. No respiratory distress. She has no wheezes.  Abdominal: Soft. Bowel sounds are normal. She exhibits no distension and no mass. There is no tenderness. There is no rebound and no guarding.  Musculoskeletal: She exhibits no edema or tenderness.  Lymphadenopathy:    She has no cervical adenopathy.  Neurological: She displays normal reflexes. No cranial nerve deficit. She exhibits normal muscle tone. Coordination normal.  Skin: No rash noted. No erythema.  Psychiatric: She has a normal mood and affect. Her behavior is normal. Judgment and thought content normal.  decreased BS B wax - hurts to touch Thin  Lab Results  Component Value Date   WBC 10.8 (H) 09/07/2016   HGB 10.6 (L) 09/07/2016   HCT 33.7 (L) 09/07/2016   PLT 483 (H) 09/07/2016   GLUCOSE 73 08/23/2016   CHOL 199 03/21/2013   TRIG 107.0 03/21/2013   HDL 57.50 03/21/2013   LDLDIRECT 144.3 01/31/2011   LDLCALC 120 (H) 03/21/2013   ALT 12 08/23/2016   AST 19 08/23/2016   NA 140 08/23/2016   K 4.6 08/23/2016   CL 105 08/23/2016   CREATININE 1.12 08/23/2016   BUN 26 (H) 08/23/2016   CO2 27 08/23/2016   TSH 1.19 03/20/2014   INR 0.9 08/13/2008    No results found.  Assessment & Plan:   There are no diagnoses linked to this encounter. I have discontinued Ms. Sarin loperamide, saccharomyces boulardii, albuterol, cephALEXin, HYDROcodone-acetaminophen, and nitrofurantoin (macrocrystal-monohydrate). I am also having her maintain her Cholecalciferol, omeprazole, umeclidinium-vilanterol, flecainide, cyanocobalamin, Biotin, Aspirin-Salicylamide-Caffeine (BC HEADACHE POWDER PO), LORazepam, metoprolol tartrate, and clonazePAM.  No orders of the defined types were placed in this encounter.    Follow-up: No Follow-up on  file.  Walker Kehr, MD

## 2016-10-31 NOTE — Assessment & Plan Note (Signed)
On B12 

## 2016-10-31 NOTE — Assessment & Plan Note (Signed)
Urol appt next week

## 2016-10-31 NOTE — Assessment & Plan Note (Signed)
ENT ref No driving if dizzy

## 2016-11-02 DIAGNOSIS — N200 Calculus of kidney: Secondary | ICD-10-CM | POA: Diagnosis not present

## 2016-11-15 ENCOUNTER — Encounter: Payer: Self-pay | Admitting: Internal Medicine

## 2016-11-21 DIAGNOSIS — H6123 Impacted cerumen, bilateral: Secondary | ICD-10-CM | POA: Diagnosis not present

## 2016-11-21 DIAGNOSIS — H903 Sensorineural hearing loss, bilateral: Secondary | ICD-10-CM | POA: Diagnosis not present

## 2016-11-22 ENCOUNTER — Encounter: Payer: Self-pay | Admitting: Internal Medicine

## 2016-11-29 ENCOUNTER — Encounter: Payer: Self-pay | Admitting: Internal Medicine

## 2016-11-29 ENCOUNTER — Ambulatory Visit (INDEPENDENT_AMBULATORY_CARE_PROVIDER_SITE_OTHER): Payer: Medicare Other | Admitting: Internal Medicine

## 2016-11-29 DIAGNOSIS — I480 Paroxysmal atrial fibrillation: Secondary | ICD-10-CM | POA: Diagnosis not present

## 2016-11-29 DIAGNOSIS — F411 Generalized anxiety disorder: Secondary | ICD-10-CM

## 2016-11-29 DIAGNOSIS — J449 Chronic obstructive pulmonary disease, unspecified: Secondary | ICD-10-CM | POA: Diagnosis not present

## 2016-11-29 DIAGNOSIS — I10 Essential (primary) hypertension: Secondary | ICD-10-CM | POA: Diagnosis not present

## 2016-11-29 DIAGNOSIS — R42 Dizziness and giddiness: Secondary | ICD-10-CM | POA: Diagnosis not present

## 2016-11-29 DIAGNOSIS — R590 Localized enlarged lymph nodes: Secondary | ICD-10-CM

## 2016-11-29 MED ORDER — AZITHROMYCIN 250 MG PO TABS: ORAL_TABLET | ORAL | 0 refills | Status: DC

## 2016-11-29 MED ORDER — ZOSTER VAC RECOMB ADJUVANTED 50 MCG/0.5ML IM SUSR: 0.5000 mL | Freq: Once | INTRAMUSCULAR | 1 refills | Status: AC

## 2016-11-29 MED ORDER — FLUCONAZOLE 150 MG PO TABS: 150.0000 mg | ORAL_TABLET | Freq: Once | ORAL | 1 refills | Status: AC

## 2016-11-29 MED ORDER — CLONAZEPAM 0.5 MG PO TABS: ORAL_TABLET | ORAL | 2 refills | Status: DC

## 2016-11-29 NOTE — Assessment & Plan Note (Signed)
On Lopressor, ASA, Flecainide

## 2016-11-29 NOTE — Assessment & Plan Note (Signed)
L jaw angle LN is long and painful  Zpac Diflucan

## 2016-11-29 NOTE — Assessment & Plan Note (Signed)
Worse Anoro Pulm ref

## 2016-11-29 NOTE — Assessment & Plan Note (Signed)
Norvasc

## 2016-11-29 NOTE — Assessment & Plan Note (Signed)
On Clonazepam prn. Not to take w/Lorazepam   Potential benefits of a long term benzodiazepines  use as well as potential risks  and complications were explained to the patient and were aknowledged. 

## 2016-11-29 NOTE — Addendum Note (Signed)
Addended by: Karren Cobble on: 11/29/2016 10:18 AM   Modules accepted: Orders

## 2016-11-29 NOTE — Assessment & Plan Note (Signed)
Resolved

## 2016-11-29 NOTE — Progress Notes (Signed)
Subjective:  Patient ID: [name], female    DOB: November 18, 1935  Age: 81 y.o. MRN: [id number]  CC: No chief complaint on file.   HPI KANIA REGNIER presents for dizziness - resolved, hearing loss, anxiety, COPD f/u  Outpatient Medications Prior to Visit  Medication Sig Dispense Refill  . Aspirin-Salicylamide-Caffeine (BC HEADACHE POWDER PO) Take 1 packet by mouth daily as needed (headaches).    . Biotin 1000 MCG tablet Take 1,000 mcg by mouth daily.    . Cholecalciferol ([employer] VITAMIN D3) 1000 UNITS capsule Take 1,000 Units by mouth daily.      . clonazePAM (KLONOPIN) 0.5 MG tablet TAKE 1 TABLET AT BEDTIME AS NEEDED FOR LEG CRAMPS 60 tablet 2  . cyanocobalamin 1000 MCG tablet Take 1,000 mcg by mouth daily.    . flecainide (TAMBOCOR) 50 MG tablet TAKE 1 TABLET BY MOUTH TWICE A DAY 60 tablet 6  . LORazepam (ATIVAN) 1 MG tablet TAKE 1 OR 2 TABLETS AT BEDTIME AS NEEDED FOR SLEEP OR ANXIETY 60 tablet 1  . metoprolol tartrate (LOPRESSOR) 25 MG tablet Take 0.5 tablets (12.5 mg total) by mouth 2 (two) times daily. 60 tablet 2  . omeprazole (PRILOSEC) 20 MG capsule Take 20 mg by mouth daily.    Marland Kitchen umeclidinium-vilanterol (ANORO ELLIPTA) 62.5-25 MCG/INH AEPB Inhale 1 puff into the lungs daily. 1 each 5   No facility-administered medications prior to visit.     ROS Review of Systems  Constitutional: Negative for activity change, appetite change, chills, fatigue and unexpected weight change.  HENT: Positive for hearing loss. Negative for congestion, mouth sores and sinus pressure.   Eyes: Negative for visual disturbance.  Respiratory: Negative for cough and chest tightness.   Gastrointestinal: Negative for abdominal pain and nausea.  Genitourinary: Negative for difficulty urinating, frequency and vaginal pain.  Musculoskeletal: Negative for back pain and gait problem.  Skin: Negative for pallor and rash.  Neurological: Negative for dizziness, tremors, weakness, numbness and headaches.    Psychiatric/Behavioral: Negative for confusion, sleep disturbance and suicidal ideas. The patient is nervous/anxious.     Objective:  BP (!) 152/68 (BP Location: Left Arm, Patient Position: Sitting, Cuff Size: Normal)   Pulse (!) 49   Temp 97.9 F (36.6 C) (Oral)   Ht 5\' 3"  (1.6 m)   Wt 116 lb (52.6 kg)   SpO2 98%   BMI 20.55 kg/m   BP Readings from Last 3 Encounters:  11/29/16 (!) 152/68  10/31/16 138/76  09/29/16 (!) 169/72    Wt Readings from Last 3 Encounters:  11/29/16 116 lb (52.6 kg)  10/31/16 116 lb (52.6 kg)  09/11/16 120 lb 2.4 oz (54.5 kg)    Physical Exam  Constitutional: She appears well-developed. No distress.  HENT:  Head: Normocephalic.  Right Ear: External ear normal.  Left Ear: External ear normal.  Nose: Nose normal.  Mouth/Throat: Oropharynx is clear and moist.  Eyes: Pupils are equal, round, and reactive to light. Conjunctivae are normal. Right eye exhibits no discharge. Left eye exhibits no discharge.  Neck: Normal range of motion. Neck supple. No JVD present. No tracheal deviation present. No thyromegaly present.  Cardiovascular: Normal rate, regular rhythm and normal heart sounds.   Pulmonary/Chest: No stridor. No respiratory distress. She has no wheezes.  Abdominal: Soft. Bowel sounds are normal. She exhibits no distension and no mass. There is no tenderness. There is no rebound and no guarding.  Musculoskeletal: She exhibits no edema or tenderness.  Lymphadenopathy:  She has no cervical adenopathy.  Neurological: She displays normal reflexes. No cranial nerve deficit. She exhibits normal muscle tone. Coordination normal.  Skin: No rash noted. No erythema.  Psychiatric: She has a normal mood and affect. Her behavior is normal. Judgment and thought content normal.  L jaw angle LN is long and painful  Lab Results  Component Value Date   WBC 10.8 (H) 09/07/2016   HGB 10.6 (L) 09/07/2016   HCT 33.7 (L) 09/07/2016   PLT 483 (H) 09/07/2016    GLUCOSE 73 08/23/2016   CHOL 199 03/21/2013   TRIG 107.0 03/21/2013   HDL 57.50 03/21/2013   LDLDIRECT 144.3 01/31/2011   LDLCALC 120 (H) 03/21/2013   ALT 12 08/23/2016   AST 19 08/23/2016   NA 140 08/23/2016   K 4.6 08/23/2016   CL 105 08/23/2016   CREATININE 1.12 08/23/2016   BUN 26 (H) 08/23/2016   CO2 27 08/23/2016   TSH 1.19 03/20/2014   INR 0.9 08/13/2008    No results found.  Assessment & Plan:   There are no diagnoses linked to this encounter. I am having Ms. Troup maintain her Cholecalciferol, omeprazole, umeclidinium-vilanterol, flecainide, cyanocobalamin, Biotin, Aspirin-Salicylamide-Caffeine (BC HEADACHE POWDER PO), metoprolol tartrate, clonazePAM, and LORazepam.  No orders of the defined types were placed in this encounter.    Follow-up: No Follow-up on file.  Walker Kehr, MD

## 2016-12-25 ENCOUNTER — Encounter: Payer: Self-pay | Admitting: Internal Medicine

## 2016-12-25 ENCOUNTER — Ambulatory Visit (INDEPENDENT_AMBULATORY_CARE_PROVIDER_SITE_OTHER): Payer: Medicare Other | Admitting: Internal Medicine

## 2016-12-25 VITALS — BP 168/68 | HR 74 | Ht 65.0 in | Wt 119.0 lb

## 2016-12-25 DIAGNOSIS — R0600 Dyspnea, unspecified: Secondary | ICD-10-CM | POA: Diagnosis not present

## 2016-12-25 DIAGNOSIS — R053 Chronic cough: Secondary | ICD-10-CM

## 2016-12-25 DIAGNOSIS — R0689 Other abnormalities of breathing: Secondary | ICD-10-CM

## 2016-12-25 DIAGNOSIS — R05 Cough: Secondary | ICD-10-CM

## 2016-12-25 DIAGNOSIS — R0602 Shortness of breath: Secondary | ICD-10-CM

## 2016-12-25 NOTE — Progress Notes (Signed)
   Subjective:    Patient ID: [name], female    DOB: [birth year], 81 y.o.   MRN: [id number]  HPI    Review of Systems  HENT: Positive for nosebleeds, postnasal drip, sinus pressure, sneezing and sore throat.   Eyes: Positive for redness and itching.  Respiratory: Positive for chest tightness and shortness of breath.   Cardiovascular: Positive for palpitations.  Musculoskeletal: Positive for joint swelling.  Allergic/Immunologic: Positive for environmental allergies.  Neurological: Positive for headaches.  Psychiatric/Behavioral: Positive for dysphoric mood. The patient is nervous/anxious.        Objective:   Physical Exam        Assessment & Plan:

## 2016-12-25 NOTE — Progress Notes (Signed)
Subjective:     Patient ID: [name], female   DOB: 27-Aug-1935, 81 y.o.   MRN: [id number]  PCP Plotnikov, Evie Lacks, MD   HPI     IOV 12/25/2016  Chief Complaint  Patient presents with  . Advice Only    per dr plotnikov/sob/pt states she had pna in hospital sept    81 year old female hard of hearing and ppor history. I had to get her to do  COPD CAT Score to get a sense of the history. As best as I can gather she tells me that she does not have a cough that she does have mild amount of sputum. She does have an extremely mild amount of chest tightness. She has mild shortness of breath walking up hill. But she denies that she's limited in her activities. The main issue appears to be sleep issues and fatigue for which she ranks both as 5 out of 5.this brought up the total COPD cat score 14. According to the patient a primary care physician tried anticholinergic long-acting beta agonist combinationby [employer] but this did not help her. In addition she tells me that somebody told her that chest x-ray pending fluid and that she had pneumonia. Review of the chart does not show any admission for pneumonia. She told the nurse that the timeframe for thisadmission was in September 2018 but she told me this was June 2008. That is a chest x-ray that I personally visualized done around June 2018 that shows hyperinflation my personal opinionalong with some basal and lipase. Overall she tells me her symptoms of being present for many years. Stable mild. It is nonprogressive she believes the symptoms are from COPD. She's worried that she might-COPD because of father died from the same issue  CAT Score 12/25/2016  Total CAT Score 14       has a past medical history of Anxiety; Arthritis; Atrial fibrillation ([place]); B12 deficiency; COPD (chronic obstructive pulmonary disease) ([place]); Depression; GERD (gastroesophageal reflux disease); Hypertension; Osteoarthrosis, hand; Peptic ulcer, unspecified  site, unspecified as acute or chronic, without mention of hemorrhage, perforation, or obstruction; Pneumonia; PONV (postoperative nausea and vomiting); and Vitamin D deficiency disease.   reports that she quit smoking about 8 years ago. Her smoking use included Cigarettes. She smoked 1.00 pack per day. She has never used smokeless tobacco.  Past Surgical History:  Procedure Laterality Date  . BLADDER SURGERY     Bladder tack and intestines  . CYSTOCELE REPAIR    . CYSTOSCOPY WITH RETROGRADE PYELOGRAM, URETEROSCOPY AND STENT PLACEMENT Right 09/11/2016   Procedure: CYSTOSCOPY WITH RETROGRADE PYELOGRAM, URETEROSCOPY AND STENT REPLACEMENT;  Surgeon: Cleon Gustin, MD;  Location: [facility];  Service: Urology;  Laterality: Right;  . CYSTOSCOPY WITH STENT PLACEMENT Right 08/11/2016   Procedure: CYSTOSCOPY, URETERSCOPY,  RIGHT RETROGRADE WITH RIGHT STENT PLACEMENT;  Surgeon: Festus Aloe, MD;  Location: [facility];  Service: Urology;  Laterality: Right;  . SPLENECTOMY      Allergies  Allergen Reactions  . Amoxicillin Other (See Comments)    "makes me crazy" Has patient had a PCN reaction causing immediate rash, facial/tongue/throat swelling, SOB or lightheadedness with hypotension: No Has patient had a PCN reaction causing severe rash involving mucus membranes or skin necrosis: No Has patient had a PCN reaction that required hospitalization: No Has patient had a PCN reaction occurring within the last 10 years: No If all of the above answers are "NO", then may proceed with Cephalosporin use.   . Codeine Nausea  And Vomiting  . Levaquin [Levofloxacin In D5w] Nausea And Vomiting  . Other Nausea And Vomiting    Pt does not want to take any pain meds    Immunization History  Administered Date(s) Administered  . Influenza Split 12/05/2011  . Influenza Whole 12/21/2009, 01/24/2011  . Influenza, High Dose Seasonal PF 10/31/2016  . Influenza,inj,Quad PF,6+ Mos 11/06/2012, 12/28/2014  .  Influenza-Unspecified 12/04/2013, 12/19/2015  . Pneumococcal Conjugate-13 12/28/2014  . Pneumococcal Polysaccharide-23 03/08/2009  . Tdap 03/08/2013    Family History  Problem Relation Age of Onset  . Heart disease Father   . Arthritis Unknown        Family history of  . Coronary artery disease Unknown        Family history of  . Hypertension Unknown        Family history of  . Diabetes Daughter        50 died     Current Outpatient Prescriptions:  .  Biotin 1000 MCG tablet, Take 1,000 mcg by mouth daily., Disp: , Rfl:  .  Cholecalciferol ([employer]S VITAMIN D3) 1000 UNITS capsule, Take 1,000 Units by mouth daily.  , Disp: , Rfl:  .  clonazePAM (KLONOPIN) 0.5 MG tablet, TAKE 1 TABLET AT BEDTIME AS NEEDED FOR LEG CRAMPS, Disp: 30 tablet, Rfl: 2 .  cyanocobalamin 1000 MCG tablet, Take 1,000 mcg by mouth daily., Disp: , Rfl:  .  flecainide (TAMBOCOR) 50 MG tablet, TAKE 1 TABLET BY MOUTH TWICE A DAY, Disp: 60 tablet, Rfl: 6 .  LORazepam (ATIVAN) 1 MG tablet, TAKE 1 OR 2 TABLETS AT BEDTIME AS NEEDED FOR SLEEP OR ANXIETY, Disp: 60 tablet, Rfl: 1 .  metoprolol tartrate (LOPRESSOR) 25 MG tablet, Take 0.5 tablets (12.5 mg total) by mouth 2 (two) times daily., Disp: 60 tablet, Rfl: 2 .  omeprazole (PRILOSEC) 20 MG capsule, Take 20 mg by mouth daily., Disp: , Rfl:  .  umeclidinium-vilanterol (ANORO ELLIPTA) 62.5-25 MCG/INH AEPB, Inhale 1 puff into the lungs daily., Disp: 1 each, Rfl: 5 .  Aspirin-Salicylamide-Caffeine (BC HEADACHE POWDER PO), Take 1 packet by mouth daily as needed (headaches)., Disp: , Rfl:     Review of Systems     Objective:   Physical Exam  Constitutional: She is oriented to person, place, and time. She appears well-developed and well-nourished. No distress.  HENT:  Head: Normocephalic and atraumatic.  Right Ear: External ear normal.  Left Ear: External ear normal.  Mouth/Throat: Oropharynx is clear and moist. No oropharyngeal exudate.  Eyes: Pupils are equal, round,  and reactive to light. Conjunctivae and EOM are normal. Right eye exhibits no discharge. Left eye exhibits no discharge. No scleral icterus.  Neck: Normal range of motion. Neck supple. No JVD present. No tracheal deviation present. No thyromegaly present.  Cardiovascular: Normal rate, regular rhythm, normal heart sounds and intact distal pulses.  Exam reveals no gallop and no friction rub.   No murmur heard. Pulmonary/Chest: Effort normal and breath sounds normal. No respiratory distress. She has no wheezes. She has no rales. She exhibits no tenderness.  ? Basal crackles  Abdominal: Soft. Bowel sounds are normal. She exhibits no distension and no mass. There is no tenderness. There is no rebound and no guarding.  Musculoskeletal: Normal range of motion. She exhibits no edema or tenderness.  Lymphadenopathy:    She has no cervical adenopathy.  Neurological: She is alert and oriented to person, place, and time. She has normal reflexes. No cranial nerve deficit. She exhibits normal muscle  tone. Coordination normal.  Skin: Skin is warm and dry. No rash noted. She is not diaphoretic. No erythema. No pallor.  Psychiatric: She has a normal mood and affect. Her behavior is normal. Judgment and thought content normal.  Vitals reviewed.  Vitals:   12/25/16 1128  BP: (!) 168/68  Pulse: 74  SpO2: 99%  Weight: 119 lb (54 kg)  Height: 5\' 5"  (1.651 m)    Estimated body mass index is 19.8 kg/m as calculated from the following:   Height as of this encounter: 5\' 5"  (1.651 m).   Weight as of this encounter: 119 lb (54 kg).     Assessment:       ICD-10-CM   1. Dyspnea and respiratory abnormalities R06.00 CT Chest High Resolution   R06.89   2. Chronic cough R05 CT Chest High Resolution  3. Shortness of breath R06.02 CT Chest High Resolution       Plan:     The etiology for shortness of breath and symptoms are not clear. She will not be able to do pulmonary function test because of hard of  hearing and comprehension. Therefore it is best to do a high-resolution CT chest without contrast and get a follow-up. If she has emphysema (we can trysome other inhaler. She has ILD then weproceed with ILD workup   Dr. Brand Males, M.D., [facility].C.P Pulmonary and Critical Care Medicine Staff Physician [facility] Pager: [phone], If no answer or between  15:00h - 7:00h: call [phone]  12/25/2016 12:06 PM

## 2016-12-25 NOTE — Patient Instructions (Signed)
ICD-10-CM   1. Dyspnea and respiratory abnormalities R06.00    R06.89   2. Chronic cough R05     In order to understand if you have copd, you will need a breathing test but  This will be a technical challenge for you  So instead, do CT chest without contrast  REturn to see my APP after CT to discuss results

## 2017-01-01 ENCOUNTER — Encounter (HOSPITAL_COMMUNITY): Payer: Self-pay

## 2017-01-01 ENCOUNTER — Ambulatory Visit (HOSPITAL_COMMUNITY)
Admission: RE | Admit: 2017-01-01 | Discharge: 2017-01-01 | Disposition: A | Discharge status: Home / Self Care | Payer: Medicare Other | Source: Ambulatory Visit | Attending: Internal Medicine | Admitting: Internal Medicine

## 2017-01-01 DIAGNOSIS — R05 Cough: Secondary | ICD-10-CM | POA: Diagnosis not present

## 2017-01-01 DIAGNOSIS — J449 Chronic obstructive pulmonary disease, unspecified: Secondary | ICD-10-CM | POA: Diagnosis not present

## 2017-01-01 DIAGNOSIS — J432 Centrilobular emphysema: Secondary | ICD-10-CM | POA: Diagnosis not present

## 2017-01-01 DIAGNOSIS — J439 Emphysema, unspecified: Secondary | ICD-10-CM | POA: Diagnosis not present

## 2017-01-01 DIAGNOSIS — I700 Atherosclerosis of aorta: Secondary | ICD-10-CM | POA: Insufficient documentation

## 2017-01-01 DIAGNOSIS — R053 Chronic cough: Secondary | ICD-10-CM

## 2017-01-01 DIAGNOSIS — R0600 Dyspnea, unspecified: Secondary | ICD-10-CM

## 2017-01-01 DIAGNOSIS — R0602 Shortness of breath: Secondary | ICD-10-CM

## 2017-01-01 DIAGNOSIS — R0689 Other abnormalities of breathing: Secondary | ICD-10-CM | POA: Diagnosis not present

## 2017-01-01 DIAGNOSIS — N200 Calculus of kidney: Secondary | ICD-10-CM | POA: Diagnosis not present

## 2017-01-01 DIAGNOSIS — I517 Cardiomegaly: Secondary | ICD-10-CM | POA: Insufficient documentation

## 2017-01-01 DIAGNOSIS — I2510 Atherosclerotic heart disease of native coronary artery without angina pectoris: Secondary | ICD-10-CM | POA: Diagnosis not present

## 2017-01-02 ENCOUNTER — Telehealth: Payer: Self-pay | Admitting: Internal Medicine

## 2017-01-02 NOTE — Telephone Encounter (Signed)
OK to fill this/these prescription(s) with additional refills x2 Thank you!  

## 2017-01-02 NOTE — Telephone Encounter (Signed)
Check [place] registry last filled 12/01/2016...Johny Chess

## 2017-01-02 NOTE — Telephone Encounter (Signed)
Pt called to make an appointment for a 3 month follow up that is scheduled in January. She said that she will be needing a refill on LORazepam (ATIVAN) 1 MG tablet #60 in November and wanted to go ahead and request the refill.

## 2017-01-03 MED ORDER — LORAZEPAM 1 MG PO TABS: ORAL_TABLET | ORAL | 2 refills | Status: DC

## 2017-01-03 NOTE — Telephone Encounter (Signed)
Notified pt MD approved refill verified which pharmacy she would like rx called it no. Called rx in to [facility] had to leave on pharmacist vm...Johny Chess

## 2017-01-04 ENCOUNTER — Ambulatory Visit (INDEPENDENT_AMBULATORY_CARE_PROVIDER_SITE_OTHER): Payer: Medicare Other | Admitting: Acute Care

## 2017-01-04 ENCOUNTER — Encounter: Payer: Self-pay | Admitting: Acute Care

## 2017-01-04 DIAGNOSIS — J449 Chronic obstructive pulmonary disease, unspecified: Secondary | ICD-10-CM | POA: Diagnosis not present

## 2017-01-04 DIAGNOSIS — J438 Other emphysema: Secondary | ICD-10-CM

## 2017-01-04 MED ORDER — BUDESONIDE-FORMOTEROL FUMARATE 80-4.5 MCG/ACT IN AERO: 2.0000 | INHALATION_SPRAY | Freq: Two times a day (BID) | RESPIRATORY_TRACT | 0 refills | Status: DC

## 2017-01-04 MED ORDER — AEROCHAMBER MV MISC: 0 refills | Status: DC

## 2017-01-04 NOTE — Assessment & Plan Note (Addendum)
Confirmed per CT No ILD Non-compliant with Anoro Plan Your Chest CT shows moderate to severe emphysema. We will start you on Symbicort inhaler, 2 puffs twice daily Rinse mouth after use, We will prescribe a spacer to help with taking your inhaler. We will instruct you on the use. Follow up in 1 month to see if the new inhaler is working for you. Please check to see if patient has had pneumonia shot. ( She has had flu) Please provide paperwork for financial assistance with Symbicort. Please contact office for sooner follow up if symptoms do not improve or worsen or seek emergency care  Follow up with cardiology

## 2017-01-04 NOTE — Progress Notes (Signed)
History of Present Illness [name] is a 81 y.o. female former smoker quit 2010 with emphysema. She is followed by Dr. Chase Coffey   01/04/2017  Pt. Presents for follow up after seeing Dr. Chase Coffey for consult for dyspnea on 12/25/2016. Plan at the time of that visit was for CT Chest ( Pt is too [place] to complete a PFT) to rule out ILD, She had been prescribes Anoro by her PCP which she states she was not using because " it didn't work, didn't help my breathing".She presents today for her scan results. She states her shortness of breath is unchanged.She denies fever, chest pain, orthopnea or hemoptysis.  Discussion: We discussed that CT scan showed Moderate to severe centrilobular emphysema with diffuse bronchial wall thickening, compatible with the provided history of COPD. We discussed that she does not have ILD.We discussed that she needs to use a maintenance medication for her COPD.I explained the importance of being compliant every day to feel better and benefit from the medication. We will also provide a spacer and teaching today to make sure she is using the inhaler right, and that she is able to maneuver the inhaler.We will provide samples of Symbicort and paperwork for financial assistance, as the Anoro did not help her..    Test Results: HRCT 01/01/2017 IMPRESSION: 1. No evidence of interstitial lung disease. 2. Moderate to severe centrilobular emphysema with diffuse bronchial wall thickening, compatible with the provided history of COPD. 3. Nonspecific patchy sclerosis throughout the thoracic spine. The differential is broad, with an infiltrative process such as myelofibrosis not excluded. 4. Mild cardiomegaly.  Three-vessel coronary atherosclerosis. 5. Nonobstructing upper left nephrolithiasis.  CBC Latest Ref Rng & Units 09/07/2016 08/23/2016 08/17/2016  WBC 4.0 - 10.5 K/uL 10.8(H) 14.8(H) 9.5  Hemoglobin 12.0 - 15.0 g/dL 10.6(L) 10.9(L) 10.3(L)  Hematocrit 36.0 - 46.0 %  33.7(L) 34.4(L) 32.1(L)  Platelets 150 - 400 K/uL 483(H) 579.0(H) 396    BMP Latest Ref Rng & Units 08/23/2016 08/17/2016 08/16/2016  Glucose 70 - 99 mg/dL 73 83 83  BUN 6 - 23 mg/dL 26(H) 11 14  Creatinine 0.40 - 1.20 mg/dL 1.12 0.70 0.78  BUN/Creat Ratio 12 - 28 - - -  Sodium 135 - 145 mEq/L 140 138 140  Potassium 3.5 - 5.1 mEq/L 4.6 3.8 3.8  Chloride 96 - 112 mEq/L 105 99(L) 99(L)  CO2 19 - 32 mEq/L 27 32 33(H)  Calcium 8.4 - 10.5 mg/dL 9.5 8.8(L) 8.7(L)    BNP    Component Value Date/Time   BNP 2,096.5 (H) 08/13/2016 1031    ProBNP    Component Value Date/Time   PROBNP 392.0 (H) 08/23/2016 1349    PFT No results found for: FEV1PRE, FEV1POST, FVCPRE, FVCPOST, TLC, DLCOUNC, PREFEV1FVCRT, PSTFEV1FVCRT  Ct Chest High Resolution  Result Date: 01/01/2017 CLINICAL DATA:  Chronic dyspnea and cough. Remote smoking history. COPD. EXAM: CT CHEST WITHOUT CONTRAST TECHNIQUE: Multidetector CT imaging of the chest was performed following the standard protocol without intravenous contrast. High resolution imaging of the lungs, as well as inspiratory and expiratory imaging, was performed. COMPARISON:  08/23/2016 chest radiograph. 09/03/2005 chest CT angiogram. FINDINGS: Cardiovascular: Mild cardiomegaly. No significant pericardial fluid/thickening. Left anterior descending, left circumflex and right coronary atherosclerosis. Atherosclerotic nonaneurysmal thoracic aorta. Normal caliber pulmonary arteries. Mediastinum/Nodes: No discrete thyroid nodules. Unremarkable esophagus. No pathologically enlarged axillary, mediastinal or gross hilar lymph nodes, noting limited sensitivity for the detection of hilar adenopathy on this noncontrast study. Lungs/Pleura: No pneumothorax. No pleural effusion.  Moderate to severe centrilobular emphysema with diffuse bronchial wall thickening. Symmetric biapical subpleural bandlike consolidation with scattered internal calcifications, not appreciably changed back to  the 09/03/2005 chest CT, most compatible with granulomatous postinfectious scarring. No acute consolidative airspace disease, lung masses are seen pulmonary nodules. Small parenchymal band in the anterior left lower lobe, compatible with nonspecific postinfectious/ postinflammatory scarring. No significant regions of subpleural reticulation, ground-glass attenuation, traction bronchiectasis, architectural distortion or frank honeycombing. No significant regions of lobular air trapping on the expiration sequence. Upper abdomen: Nonobstructing 2 mm upper left renal stone. Musculoskeletal: No aggressive appearing focal osseous lesions. Moderate thoracic spondylosis. Patchy sclerosis throughout the thoracic vertebral bodies. IMPRESSION: 1. No evidence of interstitial lung disease. 2. Moderate to severe centrilobular emphysema with diffuse bronchial wall thickening, compatible with the provided history of COPD. 3. Nonspecific patchy sclerosis throughout the thoracic spine. The differential is broad, with an infiltrative process such as myelofibrosis not excluded. 4. Mild cardiomegaly.  Three-vessel coronary atherosclerosis. 5. Nonobstructing upper left nephrolithiasis. Aortic Atherosclerosis (ICD10-I70.0) and Emphysema (ICD10-J43.9). Electronically Signed   By: [name] M.D.   On: 01/01/2017 15:55     Past medical hx Past Medical History:  Diagnosis Date  . Anxiety   . Arthritis   . Atrial fibrillation ([place])   . B12 deficiency   . COPD (chronic obstructive pulmonary disease) ([place])   . Depression   . GERD (gastroesophageal reflux disease)   . Hypertension   . Osteoarthrosis, hand    both hands  . Peptic ulcer, unspecified site, unspecified as acute or chronic, without mention of hemorrhage, perforation, or obstruction   . Pneumonia   . PONV (postoperative nausea and vomiting)   . Vitamin D deficiency disease      Social History  Substance Use Topics  . Smoking status: Former Smoker     Packs/day: 1.00    Types: Cigarettes    Quit date: 2010  . Smokeless tobacco: Never Used     Comment: pt unsure how many years she smoked  . Alcohol use No    [name] reports that she quit smoking about 8 years ago. Her smoking use included Cigarettes. She smoked 1.00 pack per day. She has never used smokeless tobacco. She reports that she does not drink alcohol or use drugs.  Tobacco Cessation: Former smoker quit 2010.  Past surgical hx, Family hx, Social hx all reviewed.  Current Outpatient Prescriptions on File Prior to Visit  Medication Sig  . Aspirin-Salicylamide-Caffeine (BC HEADACHE POWDER PO) Take 1 packet by mouth daily as needed (headaches).  . Biotin 1000 MCG tablet Take 1,000 mcg by mouth daily.  . Cholecalciferol ([employer] VITAMIN D3) 1000 UNITS capsule Take 1,000 Units by mouth daily.    . clonazePAM (KLONOPIN) 0.5 MG tablet TAKE 1 TABLET AT BEDTIME AS NEEDED FOR LEG CRAMPS  . cyanocobalamin 1000 MCG tablet Take 1,000 mcg by mouth daily.  . flecainide (TAMBOCOR) 50 MG tablet TAKE 1 TABLET BY MOUTH TWICE A DAY  . LORazepam (ATIVAN) 1 MG tablet TAKE 1 OR 2 TABLETS AT BEDTIME AS NEEDED FOR SLEEP OR ANXIETY  . metoprolol tartrate (LOPRESSOR) 25 MG tablet Take 0.5 tablets (12.5 mg total) by mouth 2 (two) times daily.  Marland Kitchen omeprazole (PRILOSEC) 20 MG capsule Take 20 mg by mouth daily.  Marland Kitchen umeclidinium-vilanterol (ANORO ELLIPTA) 62.5-25 MCG/INH AEPB Inhale 1 puff into the lungs daily.   No current facility-administered medications on file prior to visit.      Allergies  Allergen Reactions  .  Amoxicillin Other (See Comments)    "makes me crazy" Has patient had a PCN reaction causing immediate rash, facial/tongue/throat swelling, SOB or lightheadedness with hypotension: No Has patient had a PCN reaction causing severe rash involving mucus membranes or skin necrosis: No Has patient had a PCN reaction that required hospitalization: No Has patient had a PCN reaction occurring within  the last 10 years: No If all of the above answers are "NO", then may proceed with Cephalosporin use.   . Codeine Nausea And Vomiting  . Levaquin [Levofloxacin In D5w] Nausea And Vomiting  . Other Nausea And Vomiting    Pt does not want to take any pain meds    Review Of Systems:  Constitutional:   No  weight loss, night sweats,  Fevers, chills, fatigue, or  lassitude.  HEENT:   No headaches,  Difficulty swallowing,  Tooth/dental problems, or  Sore throat,                No sneezing, itching, ear ache, nasal congestion, post nasal drip,   CV:  No chest pain,  Orthopnea, PND, swelling in lower extremities, anasarca, dizziness, palpitations, syncope.   GI  No heartburn, indigestion, abdominal pain, nausea, vomiting, diarrhea, change in bowel habits, loss of appetite, bloody stools.   Resp: + shortness of breath with exertion less at rest.  + excess mucus, + productive cough,  + non-productive cough,  No coughing up of blood.  No change in color of mucus.  Occasional  wheezing.  No chest wall deformity  Skin: no rash or lesions.  GU: no dysuria, change in color of urine, no urgency or frequency.  No flank pain, no hematuria   MS:  No joint pain or swelling.  No decreased range of motion.  No back pain.  Psych:  No change in mood or affect. No depression or anxiety.  No memory loss.   Vital Signs BP 140/60 (BP Location: Left Arm, Cuff Size: Normal)   Pulse 60   Ht 5\' 8"  (1.727 m)   Wt 121 lb 9.6 oz (55.2 kg)   SpO2 96%   BMI 18.49 kg/m    Physical Exam:  General- No distress,  A&Ox3, pleasant but very HOH ENT: No sinus tenderness, TM clear, pale nasal mucosa, no oral exudate,no post nasal drip, no LAN, HOH Cardiac: S1, S2, regular rate and rhythm, no murmur Chest: Occasional  wheeze/ no rales/ dullness; no accessory muscle use, no nasal flaring, no sternal retractions Abd.: Soft Non-tender, non-distended Ext: No clubbing cyanosis, edema Neuro:  normal strength, cranial  nerves intact, MAE x 4 Skin: No rashes, warm and dry Psych: normal mood and behavior   Assessment/Plan  EMPHYSEMA Confirmed per CT No ILD Non-compliant with Anoro Plan Your Chest CT shows moderate to severe emphysema. We will start you on Symbicort inhaler, 2 puffs twice daily Rinse mouth after use, We will prescribe a spacer to help with taking your inhaler. We will instruct you on the use. Follow up in 1 month to see if the new inhaler is working for you. Please check to see if patient has had pneumonia shot. ( She has had flu) Please provide paperwork for financial assistance with Symbicort. Please contact office for sooner follow up if symptoms do not improve or worsen or seek emergency care  Follow up with cardiology     Magdalen Spatz, NP 01/04/2017  12:04 PM

## 2017-01-04 NOTE — Patient Instructions (Addendum)
It is nice to meet you today Your Chest CT shows moderate to severe emphysema. We will start you on Symbicort inhaler, 2 puffs twice daily Rinse mouth after use, We will prescribe a spacer to help with taking your inhaler. We will instruct you on the use. Follow up in 1 month to see if the new inhaler is working for you. Please check to see if patient has had pneumonia shot. ( She has had flu) Please provide paperwork for financial assistance with Symbicort. Please contact office for sooner follow up if symptoms do not improve or worsen or seek emergency care

## 2017-01-11 ENCOUNTER — Telehealth: Payer: Self-pay | Admitting: Acute Care

## 2017-01-11 NOTE — Telephone Encounter (Signed)
Patient returning call.  Stated she didn't know why we are calling her.  I reminded her she had asked to speak to someone about her inhaler.  CB is [phone]

## 2017-01-11 NOTE — Telephone Encounter (Signed)
Spoke with pt, who states she is having trouble using the symbicort sample she was given on 01/04/17. Pt states that she does not feel that she is getting any medication from the inhaler. Pt states she will try inhaler for one more week to see if there is any improvement.   SG please advise. Thanks.

## 2017-01-11 NOTE — Telephone Encounter (Signed)
ATC pt, no answer. Left message for pt to call back.  

## 2017-01-12 NOTE — Telephone Encounter (Signed)
That is fine. Please call and see if she has a spacer that may help with getting the medicationm from the inhaler. Thanks

## 2017-01-12 NOTE — Telephone Encounter (Signed)
lmomtcb x 1 to see if the pt has a spacer to use with the symbicort.

## 2017-01-15 NOTE — Telephone Encounter (Signed)
lmtcb x2 for pt. 

## 2017-01-16 NOTE — Telephone Encounter (Signed)
Per chart, pt was given a spacer on 11/1 office visit.   lmtcb X3 for pt. Will close encounter per triage protocol.

## 2017-01-18 ENCOUNTER — Telehealth: Payer: Self-pay | Admitting: Internal Medicine

## 2017-01-18 NOTE — Telephone Encounter (Signed)
Pt called asking if Dr Alain Marion could send in a medication that would help calm her down without making her sleepy. Her son passed away 4 days ago and she is having a very hard time. She started crying over the phone and said that she gets very upset and can hardly breath. Please advise.

## 2017-01-19 MED ORDER — MIRTAZAPINE 30 MG PO TABS: ORAL_TABLET | ORAL | 5 refills | Status: DC

## 2017-01-19 NOTE — Telephone Encounter (Signed)
I emailed Remeron Rx Thx

## 2017-01-19 NOTE — Telephone Encounter (Signed)
Notified pt MD sent rx to [facility]../lmb 

## 2017-01-29 ENCOUNTER — Ambulatory Visit: Payer: Self-pay | Admitting: Internal Medicine

## 2017-02-21 ENCOUNTER — Other Ambulatory Visit: Payer: Self-pay | Admitting: Internal Medicine

## 2017-02-21 MED ORDER — METOPROLOL TARTRATE 25 MG PO TABS: 12.5000 mg | ORAL_TABLET | Freq: Two times a day (BID) | ORAL | 2 refills | Status: DC

## 2017-02-21 NOTE — Telephone Encounter (Signed)
Copied from [place] [id number]. Topic: General - Other >> Feb 21, 2017  9:05 AM Darl Householder, RMA wrote: Reason for CRM: Medication refill request for Metoprolol 25 mg to be sent to [facility]

## 2017-02-21 NOTE — Telephone Encounter (Signed)
Medication refilled per protocol. 

## 2017-03-07 ENCOUNTER — Telehealth: Payer: Self-pay | Admitting: Internal Medicine

## 2017-03-07 MED ORDER — CLONAZEPAM 0.5 MG PO TABS: ORAL_TABLET | ORAL | 3 refills | Status: DC

## 2017-03-07 NOTE — Telephone Encounter (Signed)
Ok thx.

## 2017-03-07 NOTE — Telephone Encounter (Signed)
Copied from [place]. Topic: Quick Communication - Rx Refill/Question >> Mar 07, 2017  2:12 PM [place], Laban Emperor wrote: Has the patient contacted their pharmacy? Yes.     (Agent: If no, request that the patient contact the pharmacy for the refill.)   Preferred Pharmacy (with phone number or street name): [facility] [place], [address] [phone] (Phone) [phone] (Fax)  Pt is requesting a med refill on her clonazePAM (KLONOPIN) 0.5 MG tablet [[id number]]    Agent: Please be advised that RX refills may take up to 3 business days. We ask that you follow-up with your pharmacy.

## 2017-03-07 NOTE — Telephone Encounter (Signed)
Check [name] registry last filled 01/28/2017.Marland KitchenJohny Coffey

## 2017-03-08 NOTE — Telephone Encounter (Signed)
Notified pt rx has been sent to [facility]../lmb 

## 2017-03-13 DIAGNOSIS — N200 Calculus of kidney: Secondary | ICD-10-CM | POA: Diagnosis not present

## 2017-03-13 DIAGNOSIS — R300 Dysuria: Secondary | ICD-10-CM | POA: Diagnosis not present

## 2017-03-16 ENCOUNTER — Telehealth: Payer: Self-pay | Admitting: Internal Medicine

## 2017-03-16 NOTE — Telephone Encounter (Signed)
Copied from [place] [phone]. Topic: General - Other >> Mar 16, 2017  2:57 PM Patrice Paradise wrote: Reason for CRM: Patient would like a call back @  [phone]. The call is in reference to a medication that she rec'd from her kidney doctor. She is worried about the side effects.

## 2017-03-19 ENCOUNTER — Ambulatory Visit: Payer: Self-pay | Admitting: Internal Medicine

## 2017-03-19 ENCOUNTER — Telehealth: Payer: Self-pay | Admitting: Internal Medicine

## 2017-03-19 NOTE — Telephone Encounter (Signed)
It is ok to take °Thx °

## 2017-03-19 NOTE — Telephone Encounter (Signed)
Please advise 

## 2017-03-19 NOTE — Telephone Encounter (Signed)
Returned call to patient to discuss her concerns of side effects of a medication. Patient states "I need to know if I should take it or not. This medicine was prescribed by my kidney doctor." I asked what is the medication, she says"tamsulosin 0.4 mg every night at bedtime. I'm scared to take this because the side effects says it can drop my blood pressure and I live alone. I want Dr. Alain Marion to tell me it is safe to take." I asked the patient when did she receive the prescription and why did the urologist put her on it. She said "on Thursday because of a kidney stone." I asked did she call the urologist about her concerns, she said "yes and he said it's safe to take. I just want a second opinion from my doctor." I advised the patient that I would notify Dr. Alain Marion of her concerns and she would be receiving an answer from the office, she verbalized understanding.

## 2017-03-20 NOTE — Telephone Encounter (Signed)
Pt.notified

## 2017-04-01 ENCOUNTER — Other Ambulatory Visit: Payer: Self-pay | Admitting: Cardiology

## 2017-04-02 ENCOUNTER — Encounter: Payer: Self-pay | Admitting: Internal Medicine

## 2017-04-02 ENCOUNTER — Ambulatory Visit (INDEPENDENT_AMBULATORY_CARE_PROVIDER_SITE_OTHER): Payer: Medicare Other | Admitting: Internal Medicine

## 2017-04-02 DIAGNOSIS — G2581 Restless legs syndrome: Secondary | ICD-10-CM

## 2017-04-02 DIAGNOSIS — F411 Generalized anxiety disorder: Secondary | ICD-10-CM

## 2017-04-02 DIAGNOSIS — Z634 Disappearance and death of family member: Secondary | ICD-10-CM

## 2017-04-02 DIAGNOSIS — N200 Calculus of kidney: Secondary | ICD-10-CM | POA: Diagnosis not present

## 2017-04-02 DIAGNOSIS — E538 Deficiency of other specified B group vitamins: Secondary | ICD-10-CM | POA: Diagnosis not present

## 2017-04-02 DIAGNOSIS — F4321 Adjustment disorder with depressed mood: Secondary | ICD-10-CM | POA: Insufficient documentation

## 2017-04-02 NOTE — Assessment & Plan Note (Signed)
Lorazepam prn - d/c

## 2017-04-02 NOTE — Progress Notes (Signed)
Subjective:  Patient ID: [name], female    DOB: April 07, 1935  Age: 82 y.o. MRN: [id number]  CC: No chief complaint on file.   HPI [name] presents for anxiety, COPD, HTN f/u. Pt had a kidney stone... Pt stopped Lorazepam. F/u RLS Son died recently 2 mo ago - 14-Mar-2017  Outpatient Medications Prior to Visit  Medication Sig Dispense Refill  . Aspirin-Salicylamide-Caffeine (BC HEADACHE POWDER PO) Take 1 packet by mouth daily as needed (headaches).    . Biotin 1000 MCG tablet Take 1,000 mcg by mouth daily.    . budesonide-formoterol (SYMBICORT) 80-4.5 MCG/ACT inhaler Inhale 2 puffs into the lungs 2 (two) times daily. 2 Inhaler 0  . Cholecalciferol ([employer]S VITAMIN D3) 1000 UNITS capsule Take 1,000 Units by mouth daily.      . clonazePAM (KLONOPIN) 0.5 MG tablet TAKE 1 TABLET AT BEDTIME AS NEEDED FOR LEG CRAMPS 30 tablet 3  . cyanocobalamin 1000 MCG tablet Take 1,000 mcg by mouth daily.    . flecainide (TAMBOCOR) 50 MG tablet TAKE 1 TABLET BY MOUTH TWICE DAILY 60 tablet 2  . metoprolol tartrate (LOPRESSOR) 25 MG tablet Take 0.5 tablets (12.5 mg total) by mouth 2 (two) times daily. 60 tablet 2  . omeprazole (PRILOSEC) 20 MG capsule Take 20 mg by mouth daily.    Marland Kitchen Spacer/Aero-Holding Chambers (AEROCHAMBER MV) inhaler Use as instructed 1 each 0  . umeclidinium-vilanterol (ANORO ELLIPTA) 62.5-25 MCG/INH AEPB Inhale 1 puff into the lungs daily. 1 each 5  . LORazepam (ATIVAN) 1 MG tablet TAKE 1 OR 2 TABLETS AT BEDTIME AS NEEDED FOR SLEEP OR ANXIETY 60 tablet 2  . mirtazapine (REMERON) 30 MG tablet 1 po qd before dinner 30 tablet 5   No facility-administered medications prior to visit.     ROS Review of Systems  Constitutional: Negative for activity change, appetite change, chills, fatigue and unexpected weight change.  HENT: Negative for congestion, mouth sores and sinus pressure.   Eyes: Negative for visual disturbance.  Respiratory: Positive for shortness of breath. Negative for  cough and chest tightness.   Gastrointestinal: Negative for abdominal pain and nausea.  Genitourinary: Negative for difficulty urinating, frequency and vaginal pain.  Musculoskeletal: Positive for arthralgias and myalgias. Negative for back pain and gait problem.  Skin: Negative for pallor and rash.  Neurological: Negative for dizziness, tremors, weakness, numbness and headaches.  Psychiatric/Behavioral: Positive for sleep disturbance. Negative for confusion and suicidal ideas. The patient is nervous/anxious.   cramps  Objective:  BP 134/78 (BP Location: Left Arm, Patient Position: Sitting, Cuff Size: Large)   Pulse (!) 57   Temp 97.8 F (36.6 C) (Oral)   Ht 5\' 8"  (1.727 m)   Wt 123 lb (55.8 kg)   SpO2 98%   BMI 18.70 kg/m   BP Readings from Last 3 Encounters:  04/02/17 134/78  01/04/17 140/60  12/25/16 (!) 168/68    Wt Readings from Last 3 Encounters:  04/02/17 123 lb (55.8 kg)  01/04/17 121 lb 9.6 oz (55.2 kg)  12/25/16 119 lb (54 kg)    Physical Exam  Constitutional: She appears well-developed. No distress.  HENT:  Head: Normocephalic.  Right Ear: External ear normal.  Left Ear: External ear normal.  Nose: Nose normal.  Mouth/Throat: Oropharynx is clear and moist.  Eyes: Conjunctivae are normal. Pupils are equal, round, and reactive to light. Right eye exhibits no discharge. Left eye exhibits no discharge.  Neck: Normal range of motion. Neck supple. No JVD present.  No tracheal deviation present. No thyromegaly present.  Cardiovascular: Normal rate, regular rhythm and normal heart sounds.  Pulmonary/Chest: No stridor. No respiratory distress. She has no wheezes.  Abdominal: Soft. Bowel sounds are normal. She exhibits no distension and no mass. There is no tenderness. There is no rebound and no guarding.  Musculoskeletal: She exhibits no edema or tenderness.  Lymphadenopathy:    She has no cervical adenopathy.  Neurological: She displays normal reflexes. No cranial  nerve deficit. She exhibits normal muscle tone. Coordination normal.  Skin: No rash noted. No erythema.  Psychiatric: She has a normal mood and affect. Her behavior is normal. Judgment and thought content normal.    Lab Results  Component Value Date   WBC 10.8 (H) 09/07/2016   HGB 10.6 (L) 09/07/2016   HCT 33.7 (L) 09/07/2016   PLT 483 (H) 09/07/2016   GLUCOSE 73 08/23/2016   CHOL 199 03/21/2013   TRIG 107.0 03/21/2013   HDL 57.50 03/21/2013   LDLDIRECT 144.3 01/31/2011   LDLCALC 120 (H) 03/21/2013   ALT 12 08/23/2016   AST 19 08/23/2016   NA 140 08/23/2016   K 4.6 08/23/2016   CL 105 08/23/2016   CREATININE 1.12 08/23/2016   BUN 26 (H) 08/23/2016   CO2 27 08/23/2016   TSH 1.19 03/20/2014   INR 0.9 08/13/2008    Ct Chest High Resolution  Result Date: 01/01/2017 CLINICAL DATA:  Chronic dyspnea and cough. Remote smoking history. COPD. EXAM: CT CHEST WITHOUT CONTRAST TECHNIQUE: Multidetector CT imaging of the chest was performed following the standard protocol without intravenous contrast. High resolution imaging of the lungs, as well as inspiratory and expiratory imaging, was performed. COMPARISON:  08/23/2016 chest radiograph. 09/03/2005 chest CT angiogram. FINDINGS: Cardiovascular: Mild cardiomegaly. No significant pericardial fluid/thickening. Left anterior descending, left circumflex and right coronary atherosclerosis. Atherosclerotic nonaneurysmal thoracic aorta. Normal caliber pulmonary arteries. Mediastinum/Nodes: No discrete thyroid nodules. Unremarkable esophagus. No pathologically enlarged axillary, mediastinal or gross hilar lymph nodes, noting limited sensitivity for the detection of hilar adenopathy on this noncontrast study. Lungs/Pleura: No pneumothorax. No pleural effusion. Moderate to severe centrilobular emphysema with diffuse bronchial wall thickening. Symmetric biapical subpleural bandlike consolidation with scattered internal calcifications, not appreciably changed  back to the 09/03/2005 chest CT, most compatible with granulomatous postinfectious scarring. No acute consolidative airspace disease, lung masses are seen pulmonary nodules. Small parenchymal band in the anterior left lower lobe, compatible with nonspecific postinfectious/ postinflammatory scarring. No significant regions of subpleural reticulation, ground-glass attenuation, traction bronchiectasis, architectural distortion or frank honeycombing. No significant regions of lobular air trapping on the expiration sequence. Upper abdomen: Nonobstructing 2 mm upper left renal stone. Musculoskeletal: No aggressive appearing focal osseous lesions. Moderate thoracic spondylosis. Patchy sclerosis throughout the thoracic vertebral bodies. IMPRESSION: 1. No evidence of interstitial lung disease. 2. Moderate to severe centrilobular emphysema with diffuse bronchial wall thickening, compatible with the provided history of COPD. 3. Nonspecific patchy sclerosis throughout the thoracic spine. The differential is broad, with an infiltrative process such as myelofibrosis not excluded. 4. Mild cardiomegaly.  Three-vessel coronary atherosclerosis. 5. Nonobstructing upper left nephrolithiasis. Aortic Atherosclerosis (ICD10-I70.0) and Emphysema (ICD10-J43.9). Electronically Signed   By: Ilona Sorrel M.D.   On: 01/01/2017 15:55    Assessment & Plan:   There are no diagnoses linked to this encounter. I have discontinued Nillie Q. Nazari's LORazepam and mirtazapine. I am also having her maintain her Cholecalciferol, omeprazole, umeclidinium-vilanterol, cyanocobalamin, Biotin, Aspirin-Salicylamide-Caffeine (BC HEADACHE POWDER PO), AEROCHAMBER MV, budesonide-formoterol, metoprolol tartrate, clonazePAM, and flecainide.  No orders of the defined types were placed in this encounter.    Follow-up: No Follow-up on file.  Walker Kehr, MD

## 2017-04-02 NOTE — Telephone Encounter (Signed)
Rx(s) sent to pharmacy electronically.  

## 2017-04-02 NOTE — Assessment & Plan Note (Addendum)
Son died recently 38 mo ago - Dec 2018 Discussed Pt declined counseling

## 2017-04-02 NOTE — Assessment & Plan Note (Signed)
On Vit B12 

## 2017-04-02 NOTE — Assessment & Plan Note (Signed)
Chronic  Klonopin prn  Potential benefits of a long term benzodiazepines  use as well as potential risks  and complications were explained to the patient and were aknowledged.

## 2017-04-02 NOTE — Assessment & Plan Note (Signed)
F/u w/Urology 

## 2017-05-06 ENCOUNTER — Other Ambulatory Visit: Payer: Self-pay | Admitting: Cardiology

## 2017-05-18 ENCOUNTER — Ambulatory Visit: Payer: Self-pay | Admitting: *Deleted

## 2017-05-18 NOTE — Telephone Encounter (Signed)
Pt  States   Has  Been  Dizzy  For  Several  Weeks   She    Denies  Any  Chest  Pain  Or  Shortness  Of  Breath .  Appt  Made  For Sat    Clinic  With  Dr  Martinique .  Advised  To call  Back   Or call  [phone]  If  Symptoms  Worse.    Reason for Disposition . [1] MODERATE dizziness (e.g., interferes with normal activities) AND [2] has NOT been evaluated by physician for this  (Exception: dizziness caused by heat exposure, sudden standing, or poor fluid intake)  Answer Assessment - Initial Assessment Questions 1. DESCRIPTION: "Describe your dizziness."    Gets  Dizzy  Cannot  Walk  Straight   2. LIGHTHEADED: "Do you feel lightheaded?" (e.g., somewhat faint, woozy, weak upon standing)     Feels  Lightheaded   Feels  Fait  And  Weak  When  She  Stands    3. VERTIGO: "Do you feel like either you or the room is spinning or tilting?" (i.e. vertigo)       Sometimes    4. SEVERITY: "How bad is it?"  "Do you feel like you are going to faint?" "Can you stand and walk?"   - MILD - walking normally   - MODERATE - interferes with normal activities (e.g., work, school)    - SEVERE - unable to stand, requires support to walk, feels like passing out now.       Mod   Severe  At times    5. ONSET:  "When did the dizziness begin?"       About  Two  Weeks  Ago    6. AGGRAVATING FACTORS: "Does anything make it worse?" (e.g., standing, change in head position)        Worse  When  She  Walks      7. HEART RATE: "Can you tell me your heart rate?" "How many beats in 15 seconds?"  (Note: not all patients can do this)           No   8. CAUSE: "What do you think is causing the dizziness?"      Does  Not  Know    9. RECURRENT SYMPTOM: "Have you had dizziness before?" If so, ask: "When was the last time?" "What happened that time?"       Dizzy  All the  Time  Just  Lives   With it   10. OTHER SYMPTOMS: "Do you have any other symptoms?" (e.g., fever, chest pain, vomiting, diarrhea, bleeding)         Blurred  Vision    11.  PREGNANCY: "Is there any chance you are pregnant?" "When was your last menstrual period?"       n/a  Protocols used: DIZZINESS [facility]

## 2017-05-19 ENCOUNTER — Ambulatory Visit (INDEPENDENT_AMBULATORY_CARE_PROVIDER_SITE_OTHER): Payer: Medicare Other | Admitting: Family Medicine

## 2017-05-19 ENCOUNTER — Encounter: Payer: Self-pay | Admitting: Family Medicine

## 2017-05-19 VITALS — BP 140/88 | HR 61 | Temp 97.7°F | Resp 16 | Ht 68.0 in | Wt 125.0 lb

## 2017-05-19 DIAGNOSIS — I480 Paroxysmal atrial fibrillation: Secondary | ICD-10-CM | POA: Diagnosis not present

## 2017-05-19 DIAGNOSIS — D649 Anemia, unspecified: Secondary | ICD-10-CM

## 2017-05-19 DIAGNOSIS — R221 Localized swelling, mass and lump, neck: Secondary | ICD-10-CM

## 2017-05-19 DIAGNOSIS — R42 Dizziness and giddiness: Secondary | ICD-10-CM

## 2017-05-19 MED ORDER — METOPROLOL TARTRATE 25 MG PO TABS: 12.5000 mg | ORAL_TABLET | Freq: Every day | ORAL | 2 refills | Status: DC

## 2017-05-19 NOTE — Patient Instructions (Addendum)
  [name] I have seen you today for an acute visit.  A few things to remember from today's visit:   Dizziness and giddiness - Plan: CBC with Differential/Platelet, Basic metabolic panel  Anemia, unspecified type - Plan: CBC with Differential/Platelet  Paroxysmal atrial fibrillation (HCC)     Decrease Metoprolol to 1/2 tab 2 times daily. Adequate hydration. Labs Monday. Follow up with your PCP in a week. Fall precautions,use your walker.   In general please monitor for signs of worsening symptoms and seek immediate medical attention if any concerning.    I hope you get better soon!

## 2017-05-19 NOTE — Progress Notes (Signed)
ACUTE VISIT   HPI:  Chief Complaint  Patient presents with  . Dizziness    past 1-2 months  . Headache    everyday    [name] is a 82 y.o. female, who is here today with her sister complaining of 2 months of dizziness. She has history of hearing loss, so her sister is helping with obtaining history. She is not sure about exacerbating or alleviating factors. She feels unbalanced, she feels like she is "drunk."  History of unstable gait, she has a walker at home but does not want to use it. She denies any fall recently.  Dizziness does not happen while she is in bed, usually when she gets up and when she is walking.  Alleviated by sitting down and resting. Sometimes she has associated nausea.  She denies vomiting, abdominal pain, or changes in bowel habits.  She is drinking fluids and appetite is "too good."   She denies recent URI or new medications. No associated sore throat, chest pain, dyspnea, focal deficit, or syncope. She has not used OTC medications.  She denies any unusual headache.  She adds that she has bitemporal headache "all the time", which has been going on for years. She has history of atrial fibrillation, currently she is on Metoprolol 12.5 mg twice daily.  She has had intermittent palpitations, stable overall.   She is also complaining of "knot" in her neck.  She states that she has had this for "a while", she is unsure about growth, it is tender upon palpation.  Upon reviewing lab work, she has history of anemia. Lab Results  Component Value Date   WBC 10.8 (H) 09/07/2016   HGB 10.6 (L) 09/07/2016   HCT 33.7 (L) 09/07/2016   MCV 89.9 09/07/2016   PLT 483 (H) 09/07/2016     Review of Systems  Constitutional: Negative for activity change, appetite change, fatigue and fever.  HENT: Positive for hearing loss. Negative for congestion, ear pain, mouth sores, nosebleeds, rhinorrhea and sore throat.   Eyes: Negative for redness and  visual disturbance.  Respiratory: Negative for cough, shortness of breath and wheezing.        History of COPD.  Cardiovascular: Positive for palpitations. Negative for chest pain and leg swelling.  Gastrointestinal: Positive for nausea. Negative for abdominal pain and vomiting.       Negative for changes in bowel habits.  Genitourinary: Negative for decreased urine volume, dysuria and hematuria.  Musculoskeletal: Positive for gait problem. Negative for myalgias.  Skin: Negative for pallor and rash.  Neurological: Positive for dizziness and headaches. Negative for seizures, syncope and weakness.  Psychiatric/Behavioral: Negative for confusion. The patient is nervous/anxious.       Current Outpatient Medications on File Prior to Visit  Medication Sig Dispense Refill  . Aspirin-Salicylamide-Caffeine (BC HEADACHE POWDER PO) Take 1 packet by mouth daily as needed (headaches).    . Biotin 1000 MCG tablet Take 1,000 mcg by mouth daily.    . Cholecalciferol ([employer] VITAMIN D3) 1000 UNITS capsule Take 1,000 Units by mouth daily.      . clonazePAM (KLONOPIN) 0.5 MG tablet TAKE 1 TABLET AT BEDTIME AS NEEDED FOR LEG CRAMPS 30 tablet 3  . cyanocobalamin 1000 MCG tablet Take 1,000 mcg by mouth daily.    . flecainide (TAMBOCOR) 50 MG tablet TAKE 1 TABLET BY MOUTH TWICE DAILY 180 tablet 0  . omeprazole (PRILOSEC) 20 MG capsule Take 20 mg by mouth daily.    Marland Kitchen  Spacer/Aero-Holding Chambers (AEROCHAMBER MV) inhaler Use as instructed 1 each 0  . budesonide-formoterol (SYMBICORT) 80-4.5 MCG/ACT inhaler Inhale 2 puffs into the lungs 2 (two) times daily. (Patient not taking: Reported on 05-25-17) 2 Inhaler 0  . umeclidinium-vilanterol (ANORO ELLIPTA) 62.5-25 MCG/INH AEPB Inhale 1 puff into the lungs daily. (Patient not taking: Reported on 2017/05/25) 1 each 5   No current facility-administered medications on file prior to visit.      Past Medical History:  Diagnosis Date  . Anxiety   . Arthritis   . Atrial  fibrillation ([facility])   . B12 deficiency   . COPD (chronic obstructive pulmonary disease) ([place])   . Depression   . GERD (gastroesophageal reflux disease)   . Hypertension   . Osteoarthrosis, hand    both hands  . Peptic ulcer, unspecified site, unspecified as acute or chronic, without mention of hemorrhage, perforation, or obstruction   . Pneumonia   . PONV (postoperative nausea and vomiting)   . Vitamin D deficiency disease    Allergies  Allergen Reactions  . Amoxicillin Other (See Comments)    "makes me crazy" Has patient had a PCN reaction causing immediate rash, facial/tongue/throat swelling, SOB or lightheadedness with hypotension: No Has patient had a PCN reaction causing severe rash involving mucus membranes or skin necrosis: No Has patient had a PCN reaction that required hospitalization: No Has patient had a PCN reaction occurring within the last 10 years: No If all of the above answers are "NO", then may proceed with Cephalosporin use.   . Codeine Nausea And Vomiting  . Levaquin [Levofloxacin In D5w] Nausea And Vomiting  . Other Nausea And Vomiting    Pt does not want to take any pain meds    Social History   Socioeconomic History  . Marital status: Divorced    Spouse name: None  . Number of children: 2  . Years of education: None  . Highest education level: None  Social Needs  . Financial resource strain: None  . Food insecurity - worry: None  . Food insecurity - inability: None  . Transportation needs - medical: None  . Transportation needs - non-medical: None  Occupational History  . Occupation: Airline pilot: [employer]  Tobacco Use  . Smoking status: Former Smoker    Packs/day: 1.00    Types: Cigarettes    Last attempt to quit: 06/02/2008    Years since quitting: 9.2  . Smokeless tobacco: Never Used  . Tobacco comment: pt unsure how many years she smoked  Substance and Sexual Activity  . Alcohol use: No  . Drug use: No  . Sexual activity: Not  Currently  Other Topics Concern  . None  Social History Narrative   Retired, works part time   Divorced   Former Smoker   1- Son alcoholic abusive   Daughter died in Jun 03, 2006    Vitals:   05-25-17 1054  BP: 140/88  Pulse: 61  Resp: 16  Temp: 97.7 F (36.5 C)  SpO2: 95%   Body mass index is 19.01 kg/m.   Physical Exam  Nursing note and vitals reviewed. Constitutional: She is oriented to person, place, and time. She appears well-developed. She does not appear ill. No distress.  HENT:  Head: Normocephalic and atraumatic.  Right Ear: Hearing, tympanic membrane, external ear and ear canal normal.  Left Ear: Hearing, tympanic membrane, external ear and ear canal normal.  Mouth/Throat: Oropharynx is clear and moist and mucous membranes are normal.  Left  ear canal excess cerumen, could not see TM.   Eyes: Conjunctivae are normal. Pupils are equal, round, and reactive to light.  Neck: Carotid bruit is present (? Bruit right ).    2 cm x 1 cm,tender aval lesion left anterior cervical noted upon palpation.  No abnormalities upon inspection.  Cardiovascular: Normal rate and regular rhythm.  No murmur heard. Respiratory: Effort normal and breath sounds normal. No respiratory distress.  GI: Soft. There is no tenderness.  Musculoskeletal: She exhibits no edema or tenderness.  Lymphadenopathy:    She has cervical adenopathy (? Left anterior cervical).  Neurological: She is alert and oriented to person, place, and time. No cranial nerve deficit. Gait abnormal.  Reflex Scores:      Bicep reflexes are 2+ on the right side and 2+ on the left side.      Patellar reflexes are 2+ on the right side and 2+ on the left side. No focal deficit appreciated. Pronator drift negative.  Skin: Skin is warm. No rash noted. No cyanosis or erythema.  Psychiatric: Her mood appears anxious.  Well groomed, good eye contact.      ASSESSMENT AND PLAN:   Ms.Raelin was seen today for dizziness and  headache.  Diagnoses and all orders for this visit:  Dizziness and giddiness  We discussed possible etiologies. ?  Carotid artery stenosis.  Some of her chronic health problems could be causing/aggravating problem as well as medications. Reviewing records she has history of vertigo. Today here in the office BP lying down: 142/90,sitting:130/80, and upon standing up 110/82.  No major changes in HR.  Fall precautions, strongly recommend using her walker. Adequate hydration.  I do not have availability of labs at this time. Today she is hemodynamically stable, I do not think she needs to go to the ER now. Future labs placed, so she can come in 2 days to have a CBC and BMP done. She was clearly instructed about warning signs.  -     CBC with Differential/Platelet; Future -     Basic metabolic panel; Future  Anemia, unspecified type  Further recommendations will be given according to lab results. For now no changes in Aspirin.  -     CBC with Differential/Platelet; Future  Paroxysmal atrial fibrillation (HCC)  Today she is in sinus rhythm and rate control. Because mildly orthostatic, I recommend decreasing Metoprolol Titrate from 12.5 mg twice daily to 12.5 mg daily.  Might need to continue weaning off beta-blocker and consider Diltiazem. She was instructed to monitor BP and HR at home. Instructed about warning signs. Follow-up with PCP in 7 days.  -     metoprolol tartrate (LOPRESSOR) 25 MG tablet; Take 0.5 tablets (12.5 mg total) by mouth daily.  Mass of lateral neck  ?  Lymphadenopathy. Recommend following with PCP.     Return in about 2 weeks (around 06/02/2017).     -Ms.[name] was advised to seek immediate medical attention if sudden worsening symptoms .      Sheza Strickland G. Martinique, MD  [facility]. [place] office.

## 2017-05-21 ENCOUNTER — Other Ambulatory Visit (INDEPENDENT_AMBULATORY_CARE_PROVIDER_SITE_OTHER): Payer: Medicare Other

## 2017-05-21 DIAGNOSIS — D649 Anemia, unspecified: Secondary | ICD-10-CM

## 2017-05-21 DIAGNOSIS — E875 Hyperkalemia: Secondary | ICD-10-CM

## 2017-05-21 DIAGNOSIS — R42 Dizziness and giddiness: Secondary | ICD-10-CM

## 2017-05-21 LAB — CBC WITH DIFFERENTIAL/PLATELET
Basophils Absolute: 0.1 10*3/uL (ref 0.0–0.1)
Basophils Relative: 1.2 % (ref 0.0–3.0)
Eosinophils Absolute: 0.4 10*3/uL (ref 0.0–0.7)
Eosinophils Relative: 3.7 % (ref 0.0–5.0)
HCT: 34.8 % — ABNORMAL LOW (ref 36.0–46.0)
Hemoglobin: 11 g/dL — ABNORMAL LOW (ref 12.0–15.0)
Lymphocytes Relative: 22.8 % (ref 12.0–46.0)
Lymphs Abs: 2.6 10*3/uL (ref 0.7–4.0)
MCHC: 31.6 g/dL (ref 30.0–36.0)
MCV: 85.2 fl (ref 78.0–100.0)
Monocytes Absolute: 1.1 10*3/uL — ABNORMAL HIGH (ref 0.1–1.0)
Monocytes Relative: 9.8 % (ref 3.0–12.0)
Neutro Abs: 7.2 10*3/uL (ref 1.4–7.7)
Neutrophils Relative %: 62.5 % (ref 43.0–77.0)
Platelets: 379 10*3/uL (ref 150.0–400.0)
RBC: 4.08 Mil/uL (ref 3.87–5.11)
RDW: 19.2 % — ABNORMAL HIGH (ref 11.5–15.5)
WBC: 11.5 10*3/uL — ABNORMAL HIGH (ref 4.0–10.5)

## 2017-05-21 LAB — BASIC METABOLIC PANEL
BUN: 37 mg/dL — ABNORMAL HIGH (ref 6–23)
CO2: 25 mEq/L (ref 19–32)
Calcium: 10.1 mg/dL (ref 8.4–10.5)
Chloride: 107 mEq/L (ref 96–112)
Creatinine, Ser: 1.51 mg/dL — ABNORMAL HIGH (ref 0.40–1.20)
GFR: 35.3 mL/min — ABNORMAL LOW (ref >60.00)
Glucose, Bld: 111 mg/dL — ABNORMAL HIGH (ref 70–99)
Potassium: 5.7 mEq/L — ABNORMAL HIGH (ref 3.5–5.1)
Sodium: 140 mEq/L (ref 135–145)

## 2017-06-04 ENCOUNTER — Other Ambulatory Visit (INDEPENDENT_AMBULATORY_CARE_PROVIDER_SITE_OTHER): Payer: Medicare Other

## 2017-06-04 ENCOUNTER — Ambulatory Visit (INDEPENDENT_AMBULATORY_CARE_PROVIDER_SITE_OTHER): Payer: Medicare Other | Admitting: Internal Medicine

## 2017-06-04 ENCOUNTER — Other Ambulatory Visit: Payer: Self-pay | Admitting: Internal Medicine

## 2017-06-04 ENCOUNTER — Encounter: Payer: Self-pay | Admitting: Internal Medicine

## 2017-06-04 DIAGNOSIS — R590 Localized enlarged lymph nodes: Secondary | ICD-10-CM

## 2017-06-04 DIAGNOSIS — I480 Paroxysmal atrial fibrillation: Secondary | ICD-10-CM | POA: Diagnosis not present

## 2017-06-04 DIAGNOSIS — I10 Essential (primary) hypertension: Secondary | ICD-10-CM

## 2017-06-04 DIAGNOSIS — E875 Hyperkalemia: Secondary | ICD-10-CM | POA: Diagnosis not present

## 2017-06-04 LAB — BASIC METABOLIC PANEL
BUN: 30 mg/dL — ABNORMAL HIGH (ref 6–23)
CO2: 25 mEq/L (ref 19–32)
Calcium: 9.3 mg/dL (ref 8.4–10.5)
Chloride: 110 mEq/L (ref 96–112)
Creatinine, Ser: 1.34 mg/dL — ABNORMAL HIGH (ref 0.40–1.20)
GFR: 40.51 mL/min — ABNORMAL LOW (ref >60.00)
Glucose, Bld: 58 mg/dL — ABNORMAL LOW (ref 70–99)
Potassium: 5.3 mEq/L — ABNORMAL HIGH (ref 3.5–5.1)
Sodium: 142 mEq/L (ref 135–145)

## 2017-06-04 MED ORDER — UMECLIDINIUM-VILANTEROL 62.5-25 MCG/INH IN AEPB: 1.0000 | INHALATION_SPRAY | Freq: Every day | RESPIRATORY_TRACT | 11 refills | Status: DC

## 2017-06-04 NOTE — Assessment & Plan Note (Signed)
On Lopressor

## 2017-06-04 NOTE — Addendum Note (Signed)
Addended by: Karren Cobble on: 06/04/2017 11:54 AM   Modules accepted: Orders

## 2017-06-04 NOTE — Progress Notes (Signed)
Subjective:  Patient ID: [name], female    DOB: 1935/11/01  Age: 82 y.o. MRN: [id number]  CC: No chief complaint on file.   HPI KALEA PERINE presents for dizziness, elevated K, L neck swollen gland f/u  Outpatient Medications Prior to Visit  Medication Sig Dispense Refill  . Aspirin-Salicylamide-Caffeine (BC HEADACHE POWDER PO) Take 1 packet by mouth daily as needed (headaches).    . Biotin 1000 MCG tablet Take 1,000 mcg by mouth daily.    . budesonide-formoterol (SYMBICORT) 80-4.5 MCG/ACT inhaler Inhale 2 puffs into the lungs 2 (two) times daily. 2 Inhaler 0  . Cholecalciferol ([employer] VITAMIN D3) 1000 UNITS capsule Take 1,000 Units by mouth daily.      . clonazePAM (KLONOPIN) 0.5 MG tablet TAKE 1 TABLET AT BEDTIME AS NEEDED FOR LEG CRAMPS 30 tablet 3  . cyanocobalamin 1000 MCG tablet Take 1,000 mcg by mouth daily.    . flecainide (TAMBOCOR) 50 MG tablet TAKE 1 TABLET BY MOUTH TWICE DAILY 180 tablet 0  . metoprolol tartrate (LOPRESSOR) 25 MG tablet Take 0.5 tablets (12.5 mg total) by mouth daily. 60 tablet 2  . omeprazole (PRILOSEC) 20 MG capsule Take 20 mg by mouth daily.    Marland Kitchen Spacer/Aero-Holding Chambers (AEROCHAMBER MV) inhaler Use as instructed 1 each 0  . umeclidinium-vilanterol (ANORO ELLIPTA) 62.5-25 MCG/INH AEPB Inhale 1 puff into the lungs daily. 1 each 5   No facility-administered medications prior to visit.     ROS Review of Systems  Constitutional: Positive for fatigue. Negative for activity change, appetite change, chills and unexpected weight change.  HENT: Negative for congestion, mouth sores and sinus pressure.   Eyes: Negative for visual disturbance.  Respiratory: Negative for cough and chest tightness.   Gastrointestinal: Negative for abdominal pain and nausea.  Genitourinary: Negative for difficulty urinating, frequency and vaginal pain.  Musculoskeletal: Positive for arthralgias. Negative for back pain and gait problem.  Skin: Negative for pallor and  rash.  Neurological: Positive for dizziness, weakness and light-headedness. Negative for tremors, numbness and headaches.  Psychiatric/Behavioral: Positive for decreased concentration. Negative for confusion and sleep disturbance.    Objective:  BP 140/82 (BP Location: Left Arm, Patient Position: Sitting, Cuff Size: Normal)   Pulse (!) 59   Temp 98 F (36.7 C) (Oral)   Ht 5\' 8"  (1.727 m)   Wt 125 lb (56.7 kg)   SpO2 97%   BMI 19.01 kg/m   BP Readings from Last 3 Encounters:  06/04/17 140/82  05/19/17 140/88  04/02/17 134/78    Wt Readings from Last 3 Encounters:  06/04/17 125 lb (56.7 kg)  05/19/17 125 lb (56.7 kg)  04/02/17 123 lb (55.8 kg)    Physical Exam  Constitutional: She appears well-developed. No distress.  HENT:  Head: Normocephalic.  Right Ear: External ear normal.  Left Ear: External ear normal.  Nose: Nose normal.  Mouth/Throat: Oropharynx is clear and moist.  Eyes: Pupils are equal, round, and reactive to light. Conjunctivae are normal. Right eye exhibits no discharge. Left eye exhibits no discharge.  Neck: Normal range of motion. Neck supple. No JVD present. No tracheal deviation present. No thyromegaly present.  Cardiovascular: Normal rate, regular rhythm and normal heart sounds.  Pulmonary/Chest: No stridor. No respiratory distress. She has no wheezes.  Abdominal: Soft. Bowel sounds are normal. She exhibits no distension and no mass. There is no tenderness. There is no rebound and no guarding.  Musculoskeletal: She exhibits no edema or tenderness.  Lymphadenopathy:  She has cervical adenopathy.  Neurological: She displays normal reflexes. No cranial nerve deficit. She exhibits normal muscle tone. Coordination abnormal.  Skin: No rash noted. No erythema.  Psychiatric: She has a normal mood and affect. Her behavior is normal. Judgment and thought content normal.  L neck tender and swollen gland Dentures Slightly ataxic Hard hearing  Lab Results    Component Value Date   WBC 11.5 (H) 05/21/2017   HGB 11.0 (L) 05/21/2017   HCT 34.8 (L) 05/21/2017   PLT 379.0 05/21/2017   GLUCOSE 111 (H) 05/21/2017   CHOL 199 03/21/2013   TRIG 107.0 03/21/2013   HDL 57.50 03/21/2013   LDLDIRECT 144.3 01/31/2011   LDLCALC 120 (H) 03/21/2013   ALT 12 08/23/2016   AST 19 08/23/2016   NA 140 05/21/2017   K 5.7 (H) 05/21/2017   CL 107 05/21/2017   CREATININE 1.51 (H) 05/21/2017   BUN 37 (H) 05/21/2017   CO2 25 05/21/2017   TSH 1.19 03/20/2014   INR 0.9 08/13/2008    Ct Chest High Resolution  Result Date: 01/01/2017 CLINICAL DATA:  Chronic dyspnea and cough. Remote smoking history. COPD. EXAM: CT CHEST WITHOUT CONTRAST TECHNIQUE: Multidetector CT imaging of the chest was performed following the standard protocol without intravenous contrast. High resolution imaging of the lungs, as well as inspiratory and expiratory imaging, was performed. COMPARISON:  08/23/2016 chest radiograph. 09/03/2005 chest CT angiogram. FINDINGS: Cardiovascular: Mild cardiomegaly. No significant pericardial fluid/thickening. Left anterior descending, left circumflex and right coronary atherosclerosis. Atherosclerotic nonaneurysmal thoracic aorta. Normal caliber pulmonary arteries. Mediastinum/Nodes: No discrete thyroid nodules. Unremarkable esophagus. No pathologically enlarged axillary, mediastinal or gross hilar lymph nodes, noting limited sensitivity for the detection of hilar adenopathy on this noncontrast study. Lungs/Pleura: No pneumothorax. No pleural effusion. Moderate to severe centrilobular emphysema with diffuse bronchial wall thickening. Symmetric biapical subpleural bandlike consolidation with scattered internal calcifications, not appreciably changed back to the 09/03/2005 chest CT, most compatible with granulomatous postinfectious scarring. No acute consolidative airspace disease, lung masses are seen pulmonary nodules. Small parenchymal band in the anterior left  lower lobe, compatible with nonspecific postinfectious/ postinflammatory scarring. No significant regions of subpleural reticulation, ground-glass attenuation, traction bronchiectasis, architectural distortion or frank honeycombing. No significant regions of lobular air trapping on the expiration sequence. Upper abdomen: Nonobstructing 2 mm upper left renal stone. Musculoskeletal: No aggressive appearing focal osseous lesions. Moderate thoracic spondylosis. Patchy sclerosis throughout the thoracic vertebral bodies. IMPRESSION: 1. No evidence of interstitial lung disease. 2. Moderate to severe centrilobular emphysema with diffuse bronchial wall thickening, compatible with the provided history of COPD. 3. Nonspecific patchy sclerosis throughout the thoracic spine. The differential is broad, with an infiltrative process such as myelofibrosis not excluded. 4. Mild cardiomegaly.  Three-vessel coronary atherosclerosis. 5. Nonobstructing upper left nephrolithiasis. Aortic Atherosclerosis (ICD10-I70.0) and Emphysema (ICD10-J43.9). Electronically Signed   By: Ilona Sorrel M.D.   On: 01/01/2017 15:55    Assessment & Plan:   Diagnoses and all orders for this visit:  Paroxysmal atrial fibrillation ([place])  Essential hypertension  Adenopathy, cervical   I am having [name] maintain her Cholecalciferol, omeprazole, umeclidinium-vilanterol, cyanocobalamin, Biotin, Aspirin-Salicylamide-Caffeine (BC HEADACHE POWDER PO), AEROCHAMBER MV, budesonide-formoterol, clonazePAM, flecainide, and metoprolol tartrate.  No orders of the defined types were placed in this encounter.    Follow-up: No follow-ups on file.  Walker Kehr, MD

## 2017-06-04 NOTE — Assessment & Plan Note (Signed)
BMET today

## 2017-06-04 NOTE — Assessment & Plan Note (Addendum)
Persistent ENT ref Dr Redmond Baseman

## 2017-06-04 NOTE — Assessment & Plan Note (Signed)
Rate controlled 

## 2017-06-12 DIAGNOSIS — R221 Localized swelling, mass and lump, neck: Secondary | ICD-10-CM | POA: Diagnosis not present

## 2017-06-12 NOTE — Progress Notes (Signed)
HPI: FU paroxysmal atrial fibrillation. We did perform a Myoview on 01/21/08 that showed normal perfusion and an ejection fraction of 73%.  A previous monitor revealed paroxysmal atrial fibrillation/flutter with a rapid ventricular response. Note she refuses Coumadin. A CardioNet was performed in Feb 2012 secondary to palpitations and revealed sinus rhythm with PACs and rare PVCs.  Echocardiogram June 2018 showed normal LV systolic function, grade 1 diastolic dysfunction, moderate aortic insufficiency, mild left atrial enlargement, moderate right atrial enlargement.  Since I last saw her,   Current Outpatient Medications  Medication Sig Dispense Refill  . Aspirin-Salicylamide-Caffeine (BC HEADACHE POWDER PO) Take 1 packet by mouth daily as needed (headaches).    . Biotin 1000 MCG tablet Take 1,000 mcg by mouth daily.    . Cholecalciferol ([employer]S VITAMIN D3) 1000 UNITS capsule Take 1,000 Units by mouth daily.      . clonazePAM (KLONOPIN) 0.5 MG tablet TAKE 1 TABLET AT BEDTIME AS NEEDED FOR LEG CRAMPS 30 tablet 3  . cyanocobalamin 1000 MCG tablet Take 1,000 mcg by mouth daily.    . flecainide (TAMBOCOR) 50 MG tablet TAKE 1 TABLET BY MOUTH TWICE DAILY 180 tablet 0  . metoprolol tartrate (LOPRESSOR) 25 MG tablet Take 0.5 tablets (12.5 mg total) by mouth daily. 60 tablet 2  . omeprazole (PRILOSEC) 20 MG capsule Take 20 mg by mouth daily.     No current facility-administered medications for this visit.      Past Medical History:  Diagnosis Date  . Anxiety   . Arthritis   . Atrial fibrillation ([place])   . B12 deficiency   . COPD (chronic obstructive pulmonary disease) ([place])   . Depression   . GERD (gastroesophageal reflux disease)   . Hypertension   . Osteoarthrosis, hand    both hands  . Peptic ulcer, unspecified site, unspecified as acute or chronic, without mention of hemorrhage, perforation, or obstruction   . Pneumonia   . PONV (postoperative nausea and vomiting)   . Vitamin D  deficiency disease     Past Surgical History:  Procedure Laterality Date  . BLADDER SURGERY     Bladder tack and intestines  . CYSTOCELE REPAIR    . CYSTOSCOPY WITH RETROGRADE PYELOGRAM, URETEROSCOPY AND STENT PLACEMENT Right 09/11/2016   Procedure: CYSTOSCOPY WITH RETROGRADE PYELOGRAM, URETEROSCOPY AND STENT REPLACEMENT;  Surgeon: Cleon Gustin, MD;  Location: [facility];  Service: Urology;  Laterality: Right;  . CYSTOSCOPY WITH STENT PLACEMENT Right 08/11/2016   Procedure: CYSTOSCOPY, URETERSCOPY,  RIGHT RETROGRADE WITH RIGHT STENT PLACEMENT;  Surgeon: Festus Aloe, MD;  Location: [facility];  Service: Urology;  Laterality: Right;  . SPLENECTOMY      Social History   Socioeconomic History  . Marital status: Divorced    Spouse name: Not on file  . Number of children: 2  . Years of education: Not on file  . Highest education level: Not on file  Occupational History  . Occupation: Airline pilot: [employer]  . Financial resource strain: Not on file  . Food insecurity:    Worry: Not on file    Inability: Not on file  . Transportation needs:    Medical: Not on file    Non-medical: Not on file  Tobacco Use  . Smoking status: Former Smoker    Packs/day: 1.00    Types: Cigarettes    Last attempt to quit: 2010    Years since quitting: 9.3  . Smokeless tobacco: Never Used  .  Tobacco comment: pt unsure how many years she smoked  Substance and Sexual Activity  . Alcohol use: No  . Drug use: No  . Sexual activity: Not Currently  Lifestyle  . Physical activity:    Days per week: Not on file    Minutes per session: Not on file  . Stress: Not on file  Relationships  . Social connections:    Talks on phone: Not on file    Gets together: Not on file    Attends religious service: Not on file    Active member of club or organization: Not on file    Attends meetings of clubs or organizations: Not on file    Relationship status: Not on file  . Intimate partner  violence:    Fear of current or ex partner: Not on file    Emotionally abused: Not on file    Physically abused: Not on file    Forced sexual activity: Not on file  Other Topics Concern  . Not on file  Social History Narrative   Retired, works part time   Divorced   Former Smoker   1- Son alcoholic abusive   Daughter died in 2006-06-05    Family History  Problem Relation Age of Onset  . Heart disease Father   . Arthritis Unknown        Family history of  . Coronary artery disease Unknown        Family history of  . Hypertension Unknown        Family history of  . Diabetes Daughter        64 died    ROS: no fevers or chills, productive cough, hemoptysis, dysphasia, odynophagia, melena, hematochezia, dysuria, hematuria, rash, seizure activity, orthopnea, PND, pedal edema, claudication. Remaining systems are negative.  Physical Exam: Well-developed well-nourished in no acute distress.  Skin is warm and dry.  HEENT is normal.  Neck is supple.  Chest is clear to auscultation with normal expansion.  Cardiovascular exam is regular rate and rhythm.  Abdominal exam nontender or distended. No masses palpated. Extremities show no edema. neuro grossly intact  ECG-sinus rhythm at a rate of 61.  First-degree AV block.  Cannot rule out septal infarct.  Personally reviewed  A/P  1 paroxysmal atrial fibrillation-patient remains in sinus rhythm today.  We will continue with flecainide and metoprolol.  Continue aspirin.  She has declined anticoagulation previously and understands the higher risk of CVA.  2 hypertension-Blood pressure is elevated.  Add amlodipine 5 mg daily and follow.  3 aortic insufficiency-repeat echocardiogram June 2019.  Kirk Ruths, MD

## 2017-06-14 DIAGNOSIS — N200 Calculus of kidney: Secondary | ICD-10-CM | POA: Diagnosis not present

## 2017-06-14 DIAGNOSIS — R300 Dysuria: Secondary | ICD-10-CM | POA: Diagnosis not present

## 2017-06-18 ENCOUNTER — Other Ambulatory Visit: Payer: Self-pay | Admitting: Otolaryngology

## 2017-06-18 ENCOUNTER — Other Ambulatory Visit: Payer: Self-pay

## 2017-06-18 ENCOUNTER — Other Ambulatory Visit (INDEPENDENT_AMBULATORY_CARE_PROVIDER_SITE_OTHER): Payer: Medicare Other

## 2017-06-18 DIAGNOSIS — E875 Hyperkalemia: Secondary | ICD-10-CM

## 2017-06-18 DIAGNOSIS — R221 Localized swelling, mass and lump, neck: Secondary | ICD-10-CM

## 2017-06-18 LAB — BASIC METABOLIC PANEL
BUN: 28 mg/dL — ABNORMAL HIGH (ref 6–23)
CO2: 24 mEq/L (ref 19–32)
Calcium: 9.2 mg/dL (ref 8.4–10.5)
Chloride: 110 mEq/L (ref 96–112)
Creatinine, Ser: 1.52 mg/dL — ABNORMAL HIGH (ref 0.40–1.20)
GFR: 35.02 mL/min — ABNORMAL LOW (ref >60.00)
Glucose, Bld: 84 mg/dL (ref 70–99)
Potassium: 5 mEq/L (ref 3.5–5.1)
Sodium: 140 mEq/L (ref 135–145)

## 2017-06-19 ENCOUNTER — Telehealth: Payer: Self-pay

## 2017-06-19 NOTE — Telephone Encounter (Signed)
(  Routed to Dr. Alain Marion)  Patient returned call to [facility] and wanted to know why her gait was off if her labs improved. Please advise.  Thanks

## 2017-06-20 NOTE — Telephone Encounter (Signed)
Her gait problem is caused by other reasons Thx

## 2017-06-20 NOTE — Telephone Encounter (Signed)
Please advise on what other possible reasons could be causing gait problems, I will call patient back, thanks

## 2017-06-23 NOTE — Telephone Encounter (Signed)
pls sch OV if it is worse than before Thx

## 2017-06-25 ENCOUNTER — Ambulatory Visit: Payer: Medicare Other | Admitting: Cardiology

## 2017-06-25 ENCOUNTER — Encounter: Payer: Self-pay | Admitting: Cardiology

## 2017-06-25 VITALS — BP 158/68 | HR 61 | Ht 64.5 in | Wt 127.2 lb

## 2017-06-25 DIAGNOSIS — I480 Paroxysmal atrial fibrillation: Secondary | ICD-10-CM | POA: Diagnosis not present

## 2017-06-25 DIAGNOSIS — I359 Nonrheumatic aortic valve disorder, unspecified: Secondary | ICD-10-CM

## 2017-06-25 DIAGNOSIS — I10 Essential (primary) hypertension: Secondary | ICD-10-CM | POA: Diagnosis not present

## 2017-06-25 MED ORDER — AMLODIPINE BESYLATE 5 MG PO TABS: 5.0000 mg | ORAL_TABLET | Freq: Every day | ORAL | 3 refills | Status: DC

## 2017-06-25 NOTE — Patient Instructions (Signed)
Medication Instructions:   START AMLODIPINE 5 MG ONCE DAILY  Testing/Procedures:  Your physician has requested that you have an echocardiogram. Echocardiography is a painless test that uses sound waves to create images of your heart. It provides your doctor with information about the size and shape of your heart and how well your heart's chambers and valves are working. This procedure takes approximately one hour. There are no restrictions for this procedure.  SCHEDULE IN [place]  Follow-Up:  Your physician wants you to follow-up in: [place] will receive a reminder letter in the mail two months in advance. If you don't receive a letter, please call our office to schedule the follow-up appointment.   If you need a refill on your cardiac medications before your next appointment, please call your pharmacy.

## 2017-06-26 ENCOUNTER — Ambulatory Visit
Admission: RE | Admit: 2017-06-26 | Discharge: 2017-06-26 | Disposition: A | Discharge status: Home / Self Care | Payer: Medicare Other | Source: Ambulatory Visit | Attending: Otolaryngology | Admitting: Otolaryngology

## 2017-06-26 DIAGNOSIS — R1310 Dysphagia, unspecified: Secondary | ICD-10-CM | POA: Diagnosis not present

## 2017-06-26 DIAGNOSIS — R221 Localized swelling, mass and lump, neck: Secondary | ICD-10-CM

## 2017-06-26 MED ORDER — IOPAMIDOL (ISOVUE-300) INJECTION 61%
50.0000 mL | Freq: Once | INTRAVENOUS | Status: AC | PRN
  Administered 2017-06-26: 50 mL via INTRAVENOUS

## 2017-06-26 NOTE — Telephone Encounter (Signed)
Patient stated that she is about the same as usual, she states she is getting older and is probably the reason for gait issues---I have advised that we can see her in office visit if she changes her mind or symptoms get worse

## 2017-07-02 ENCOUNTER — Ambulatory Visit: Payer: Self-pay | Admitting: Internal Medicine

## 2017-07-03 ENCOUNTER — Other Ambulatory Visit: Payer: Self-pay | Admitting: Internal Medicine

## 2017-07-03 ENCOUNTER — Other Ambulatory Visit: Payer: Self-pay | Admitting: Cardiology

## 2017-07-03 NOTE — Telephone Encounter (Signed)
Rx sent to pharmacy   

## 2017-07-31 ENCOUNTER — Ambulatory Visit: Payer: Self-pay | Admitting: Internal Medicine

## 2017-07-31 ENCOUNTER — Telehealth: Payer: Self-pay | Admitting: Internal Medicine

## 2017-07-31 NOTE — Telephone Encounter (Signed)
Copied from [place] [id number]. Topic: Quick Communication - See Telephone Encounter >> Jul 31, 2017 12:58 PM Bea Graff, NT wrote: CRM for notification. See Telephone encounter for: 07/31/17. Pt calling and states she has a UTI and would like some medication. She refuses to come in for an appointment.

## 2017-08-01 NOTE — Telephone Encounter (Signed)
Pt needs appt, antibiotics will not be given without appt, please call pt to schedule.

## 2017-08-02 ENCOUNTER — Telehealth: Payer: Self-pay | Admitting: Internal Medicine

## 2017-08-02 DIAGNOSIS — I480 Paroxysmal atrial fibrillation: Secondary | ICD-10-CM

## 2017-08-02 NOTE — Telephone Encounter (Signed)
noted 

## 2017-08-02 NOTE — Telephone Encounter (Signed)
Copied from [place] [id number]. Topic: Quick Communication - Rx Refill/Question >> Aug 02, 2017  2:11 PM Bea Graff, NT wrote: Medication: metoprolol tartrate (LOPRESSOR) 25 MG tablet   Has the patient contacted their pharmacy? Yes.   (Agent: If no, request that the patient contact the pharmacy for the refill.) (Agent: If yes, when and what did the pharmacy advise?)  Preferred Pharmacy (with phone number or street name): [phone] - [place], [address] AT [address] [phone] (Phone) [phone] (Fax)      Agent: Please be advised that RX refills may take up to 3 business days. We ask that you follow-up with your pharmacy.
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
## 2017-08-03 MED ORDER — METOPROLOL TARTRATE 25 MG PO TABS: 12.5000 mg | ORAL_TABLET | Freq: Every day | ORAL | 2 refills | Status: DC

## 2017-08-03 MED ORDER — METOPROLOL TARTRATE 25 MG PO TABS: 12.5000 mg | ORAL_TABLET | Freq: Two times a day (BID) | ORAL | 2 refills | Status: DC

## 2017-08-03 NOTE — Telephone Encounter (Signed)
Pt requesting refill of Metoprolol 25mg  tab.  Contacted Bri, with [facility] to see if pt had picked up refill of medication. Last prescription was sent to pharmacy on 05/09/17 #60 with 2 refills. Bri states that the pt picked up prescription on 05/19/17 and was given a 90 day supply. Pharmacy states that the current prescription on file states for the pt to take 0.5 tab twice daily. Clarification needed on how the pt is supposed to take the medication.    Left message for pt to call office back to receive clarification it pt is taking Metoprolol once a day or twice a day.

## 2017-08-03 NOTE — Telephone Encounter (Signed)
RX sent

## 2017-08-03 NOTE — Telephone Encounter (Signed)
Patient states that she takes 1/2 tab twice daily of metoprolol. Cardiologist has prescribed her amlodipine besylate 5mg . Is she to take both? Requesting call back [phone].

## 2017-08-03 NOTE — Telephone Encounter (Signed)
RX changed, pt notified to take both medications

## 2017-08-03 NOTE — Addendum Note (Signed)
Addended by: Karren Cobble on: 08/03/2017 01:39 PM   Modules accepted: Orders

## 2017-08-07 ENCOUNTER — Other Ambulatory Visit (HOSPITAL_COMMUNITY): Payer: Self-pay

## 2017-08-14 ENCOUNTER — Ambulatory Visit (INDEPENDENT_AMBULATORY_CARE_PROVIDER_SITE_OTHER): Payer: Medicare Other | Admitting: Internal Medicine

## 2017-08-14 ENCOUNTER — Encounter: Payer: Self-pay | Admitting: Internal Medicine

## 2017-08-14 DIAGNOSIS — J4489 Other specified chronic obstructive pulmonary disease: Secondary | ICD-10-CM

## 2017-08-14 DIAGNOSIS — I480 Paroxysmal atrial fibrillation: Secondary | ICD-10-CM | POA: Diagnosis not present

## 2017-08-14 DIAGNOSIS — I10 Essential (primary) hypertension: Secondary | ICD-10-CM

## 2017-08-14 DIAGNOSIS — J449 Chronic obstructive pulmonary disease, unspecified: Secondary | ICD-10-CM

## 2017-08-14 DIAGNOSIS — E538 Deficiency of other specified B group vitamins: Secondary | ICD-10-CM | POA: Diagnosis not present

## 2017-08-14 DIAGNOSIS — F411 Generalized anxiety disorder: Secondary | ICD-10-CM | POA: Diagnosis not present

## 2017-08-14 MED ORDER — UMECLIDINIUM-VILANTEROL 62.5-25 MCG/INH IN AEPB: 1.0000 | INHALATION_SPRAY | Freq: Every day | RESPIRATORY_TRACT | 3 refills | Status: DC

## 2017-08-14 NOTE — Progress Notes (Signed)
Subjective:  Patient ID: [name], female    DOB: [birth year]  Age: 82 y.o. MRN: [id number]  CC: No chief complaint on file.   HPI [name] presents for HTN, anxiety COPD f/u. Anoro is too $$$  Outpatient Medications Prior to Visit  Medication Sig Dispense Refill  . amLODipine (NORVASC) 5 MG tablet Take 1 tablet (5 mg total) by mouth daily. 90 tablet 3  . Aspirin-Salicylamide-Caffeine (BC HEADACHE POWDER PO) Take 1 packet by mouth daily as needed (headaches).    . Biotin 1000 MCG tablet Take 1,000 mcg by mouth daily.    . Cholecalciferol (CVS VITAMIN D3) 1000 UNITS capsule Take 1,000 Units by mouth daily.      . clonazePAM (KLONOPIN) 0.5 MG tablet TAKE 1 TABLET BY MOUTH AT BEDTIME AS NEEDED FOR LEG CRAMPS 30 tablet 2  . cyanocobalamin 1000 MCG tablet Take 1,000 mcg by mouth daily.    . flecainide (TAMBOCOR) 50 MG tablet TAKE 1 TABLET BY MOUTH TWICE DAILY 180 tablet 0  . flecainide (TAMBOCOR) 50 MG tablet TAKE 1 TABLET BY MOUTH TWICE DAILY 180 tablet 3  . metoprolol tartrate (LOPRESSOR) 25 MG tablet Take 0.5 tablets (12.5 mg total) by mouth 2 (two) times daily. 60 tablet 2  . omeprazole (PRILOSEC) 20 MG capsule Take 20 mg by mouth daily.     No facility-administered medications prior to visit.     ROS: Review of Systems  Constitutional: Positive for fatigue. Negative for activity change, appetite change, chills and unexpected weight change.  HENT: Negative for congestion, mouth sores and sinus pressure.   Eyes: Negative for visual disturbance.  Respiratory: Positive for shortness of breath. Negative for cough and chest tightness.   Gastrointestinal: Negative for abdominal pain and nausea.  Genitourinary: Negative for difficulty urinating, frequency and vaginal pain.  Musculoskeletal: Negative for back pain and gait problem.  Skin: Negative for pallor and rash.  Neurological: Negative for dizziness, tremors, weakness, numbness and headaches.  Psychiatric/Behavioral:  Negative for confusion and sleep disturbance. The patient is nervous/anxious.     Objective:  BP 132/78 (BP Location: Left Arm, Patient Position: Sitting, Cuff Size: Normal)   Pulse (!) 56   Temp 98.2 F (36.8 C) (Oral)   Ht 5' 4.5" (1.638 m)   Wt 131 lb (59.4 kg)   SpO2 98%   BMI 22.14 kg/m   BP Readings from Last 3 Encounters:  08/14/17 132/78  06/25/17 (!) 158/68  06/04/17 140/82    Wt Readings from Last 3 Encounters:  08/14/17 131 lb (59.4 kg)  06/25/17 127 lb 3.2 oz (57.7 kg)  06/04/17 125 lb (56.7 kg)    Physical Exam  Constitutional: She appears well-developed. No distress.  HENT:  Head: Normocephalic.  Right Ear: External ear normal.  Left Ear: External ear normal.  Nose: Nose normal.  Mouth/Throat: Oropharynx is clear and moist.  Eyes: Pupils are equal, round, and reactive to light. Conjunctivae are normal. Right eye exhibits no discharge. Left eye exhibits no discharge.  Neck: Normal range of motion. Neck supple. No JVD present. No tracheal deviation present. No thyromegaly present.  Cardiovascular: Normal rate, regular rhythm and normal heart sounds.  Pulmonary/Chest: No stridor. No respiratory distress. She has no wheezes.  Abdominal: Soft. Bowel sounds are normal. She exhibits no distension and no mass. There is no tenderness. There is no rebound and no guarding.  Musculoskeletal: She exhibits no edema or tenderness.  Lymphadenopathy:    She has no cervical adenopathy.  Neurological:  She displays normal reflexes. No cranial nerve deficit. She exhibits normal muscle tone. Coordination normal.  Skin: No rash noted. No erythema.  Psychiatric: She has a normal mood and affect. Her behavior is normal. Judgment and thought content normal.    Lab Results  Component Value Date   WBC 11.5 (H) 05/21/2017   HGB 11.0 (L) 05/21/2017   HCT 34.8 (L) 05/21/2017   PLT 379.0 05/21/2017   GLUCOSE 84 06/18/2017   CHOL 199 03/21/2013   TRIG 107.0 03/21/2013   HDL 57.50  03/21/2013   LDLDIRECT 144.3 01/31/2011   LDLCALC 120 (H) 03/21/2013   ALT 12 08/23/2016   AST 19 08/23/2016   NA 140 06/18/2017   K 5.0 06/18/2017   CL 110 06/18/2017   CREATININE 1.52 (H) 06/18/2017   BUN 28 (H) 06/18/2017   CO2 24 06/18/2017   TSH 1.19 03/20/2014   INR 0.9 08/13/2008    Ct Soft Tissue Neck W Contrast  Result Date: 06/26/2017 CLINICAL DATA:  82 y/o F; painful dysphagia, hoarseness, and headaches. Evaluate for left neck mass. EXAM: CT NECK WITH CONTRAST TECHNIQUE: Multidetector CT imaging of the neck was performed using the standard protocol following the bolus administration of intravenous contrast. CONTRAST:  64mL ISOVUE-300 IOPAMIDOL (ISOVUE-300) INJECTION 61% COMPARISON:  None. FINDINGS: Pharynx and larynx: Normal. No exophytic mass, abnormal mucosal enhancement, or swelling. Salivary glands: No inflammation, mass, or stone. Thyroid: Normal. Lymph nodes: None enlarged or abnormal density. Vascular: Calcific atherosclerosis of the aortic arch and carotid bifurcations. The left proximal ICA is mildly tortuous extending laterally in the neck soft tissues deep to the sternocleidomastoid muscle at the level of palpable abnormality (series 2, image 16). Limited intracranial: Negative. Visualized orbits: Negative. Mastoids and visualized paranasal sinuses: Clear. Skeleton: No acute or aggressive process. Cervical spondylosis with multilevel disc and facet degenerative changes. No high-grade bony canal stenosis. Mild reversal of cervical curvature. Upper chest: Moderate centrilobular emphysema of the upper lungs. 2 mm right upper lobe pulmonary nodule (series 3, image 31). Biapical mild pleuroparenchymal scarring. Other: None. IMPRESSION: 1. No mass or inflammatory process of the neck identified. 2. Left proximal ICA is mildly tortuous extending laterally in the neck soft tissues deep to sternocleidomastoid muscle at level of palpable abnormality. Correlate for pulsatility of palpable  abnormality. 3. Moderate centrilobular emphysema of upper lungs. 4. 2 mm pulmonary nodule and right upper lobe. No follow-up needed if patient is low-risk. Non-contrast chest CT can be considered in 12 months if patient is high-risk. This recommendation follows the consensus statement: Guidelines for Management of Incidental Pulmonary Nodules Detected on CT Images: From the Fleischner Society 2017; Radiology 2017; 284:228-243. Electronically Signed   By: Kristine Garbe M.D.   On: 06/26/2017 16:05    Assessment & Plan:   There are no diagnoses linked to this encounter.   No orders of the defined types were placed in this encounter.    Follow-up: No follow-ups on file.  Walker Kehr, MD

## 2017-08-14 NOTE — Patient Instructions (Signed)
MC well w/Jill 

## 2017-08-14 NOTE — Assessment & Plan Note (Signed)
On Lopressor, ASA, Flecainide

## 2017-08-14 NOTE — Assessment & Plan Note (Signed)
Norvasc

## 2017-08-14 NOTE — Assessment & Plan Note (Addendum)
Anoro is too $$$ Will fill out forms for Patient Assistance

## 2017-08-14 NOTE — Assessment & Plan Note (Signed)
Lorazepam - rare use

## 2017-08-14 NOTE — Assessment & Plan Note (Signed)
On Vit B12 

## 2017-08-20 ENCOUNTER — Encounter: Payer: Self-pay | Admitting: Cardiology

## 2017-09-03 ENCOUNTER — Ambulatory Visit: Payer: Self-pay | Admitting: Internal Medicine

## 2017-09-13 ENCOUNTER — Telehealth: Payer: Self-pay | Admitting: Internal Medicine

## 2017-09-13 ENCOUNTER — Other Ambulatory Visit (HOSPITAL_COMMUNITY): Payer: Self-pay

## 2017-09-13 DIAGNOSIS — I480 Paroxysmal atrial fibrillation: Secondary | ICD-10-CM

## 2017-09-13 MED ORDER — METOPROLOL TARTRATE 25 MG PO TABS: 12.5000 mg | ORAL_TABLET | Freq: Two times a day (BID) | ORAL | 2 refills | Status: DC

## 2017-09-13 MED ORDER — METOPROLOL TARTRATE 25 MG PO TABS: 12.5000 mg | ORAL_TABLET | Freq: Two times a day (BID) | ORAL | 1 refills | Status: DC

## 2017-09-13 NOTE — Telephone Encounter (Signed)
Called pt she states whn she got her metoprolol filled back in May the instructions was change to to 1/2 tab daily, but she always taken 1/2 twice a day. Inform pt per our records she is correct, and not sure why pharmacy only gave her #45 pills. Will send pharmacy rx for the metoprolol w/directions 1/2 tab twice a day #

## 2017-09-13 NOTE — Telephone Encounter (Signed)
Copied from [place] [id number]. Topic: Quick Communication - See Telephone Encounter >> Sep 13, 2017 10:58 AM Conception Chancy, NT wrote: CRM for notification. See Telephone encounter for: 09/13/17.  Patient is calling and states that she has questions about metoprolol tartrate (LOPRESSOR) 25 MG tablet. She states she has always taken 0.5 tablet 2x a day. She states the last time she got it refilled it was written for take 0.5 tablet 1x a day. She is out of medication due to taking 2 tablets a day and they will not refill medication. Please advise.  [facility] [phone] [address], [address] [place] [address] [place] [address] Phone: [phone] Fax: [phone]

## 2017-09-17 ENCOUNTER — Ambulatory Visit (HOSPITAL_COMMUNITY): Payer: Medicare Other | Attending: Cardiovascular Disease

## 2017-09-17 ENCOUNTER — Other Ambulatory Visit: Payer: Self-pay

## 2017-09-17 DIAGNOSIS — I4891 Unspecified atrial fibrillation: Secondary | ICD-10-CM | POA: Diagnosis not present

## 2017-09-17 DIAGNOSIS — I359 Nonrheumatic aortic valve disorder, unspecified: Secondary | ICD-10-CM | POA: Diagnosis not present

## 2017-09-17 DIAGNOSIS — J449 Chronic obstructive pulmonary disease, unspecified: Secondary | ICD-10-CM | POA: Diagnosis not present

## 2017-09-17 DIAGNOSIS — I10 Essential (primary) hypertension: Secondary | ICD-10-CM | POA: Insufficient documentation

## 2017-11-14 ENCOUNTER — Ambulatory Visit: Payer: Self-pay | Admitting: Internal Medicine

## 2017-11-16 ENCOUNTER — Ambulatory Visit: Payer: Self-pay | Admitting: Internal Medicine

## 2017-11-30 ENCOUNTER — Ambulatory Visit: Payer: Self-pay | Admitting: Internal Medicine

## 2017-11-30 NOTE — Telephone Encounter (Signed)
I'm dizzy.   I've been dizzy for 2 weeks.   My COPD is worse too.    This medicine is not helping for the COPD.    I have acid reflux.   Also I gained 20 lbs over the last year since I quit work.   Dr. Alain Marion knows and said that's great.   He said I needed to gain weight.  I weigh 140 now.   My normal should be 120.  All I do is watch TV all day.   My bladder hurts but I have an appt for it next week.    Why is my stomach blowing up?   I look like I'm pregnant.  Every time I check my BP it's different.    121/57 now.   I let her know her BP would fluctuate and that's normal.  It would never be the same reading.   I let her know (after looking at her OV in the chart) that she did not need to check her BP several times a day.   That her BP is well controlled.   That is probably causing you unnecessary stress.   She agreed.     My food stops in my throat.   My food feels like it's getting stuck.   It's worse here lately.  I'm taking Prilosec but it's not helping.     I'm throwing up every night for a week and 1/2.   I quit eating at night and I don't throw up at night.    I need to stop eating hotdogs and hamburgers at night.   I encouraged her to eat lighter meals at night or earlier in the evening.  I live by self.   I do my own cooking.   I lost my son 7 months ago.   I sure miss him.   He came to see me every day.   (She is crying)  He was only 80 years old.   He took drugs and took too many.   I found him.  I lost my daughter due to diabetes 3-4 years ago.    It was so hard to find my son dead.   They think his death was accidental because he took the wrong pill trying to sleep.     I need something to do to keep my mind occupied.   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   My sister takes me where I need to go.   We went to [employer] yesterday.  She knows I need to get out of the house.   If I didn't have her I don't know what I would do.  I was just concerned about my BP but since it's ok  I guess I should let you go.  After talking with her I think she is lonely and wanting someone to talk to.   I let her talk and gave her reassurance and encouragement.   In her conversations she would jump from one problem to another but never really get in detail about any of them when I tried to inquire further with her.    She discussed the loss of her son and was crying.   I gave her emotional support.  I encouraged her to maybe do some volunteer work and get involved in something that would keep her mind occupied.     No appt made.   She has an appt coming up  with Dr. Alain Marion in Oct.   I reminded her of it.   I instructed her to call us back if she had any other problems.   I was not able to get more from her regarding the dizziness.   She said she has COPD.     I feel she mainly needed someone to talk to and receive encouragement from than anything else.  No protocol used.

## 2017-12-06 ENCOUNTER — Ambulatory Visit: Payer: Self-pay | Admitting: Internal Medicine

## 2017-12-07 DIAGNOSIS — R300 Dysuria: Secondary | ICD-10-CM | POA: Diagnosis not present

## 2017-12-07 DIAGNOSIS — N952 Postmenopausal atrophic vaginitis: Secondary | ICD-10-CM | POA: Diagnosis not present

## 2017-12-08 IMAGING — CR DG ABDOMEN 1V
1 series · 1 of 1 positions shown · non-contrast
Comparison: 08/11/2016.  CT scan 08/10/2016

CLINICAL DATA: Right ureteral stone.

EXAM:
ABDOMEN - 1 VIEW

[t abdomen supine]
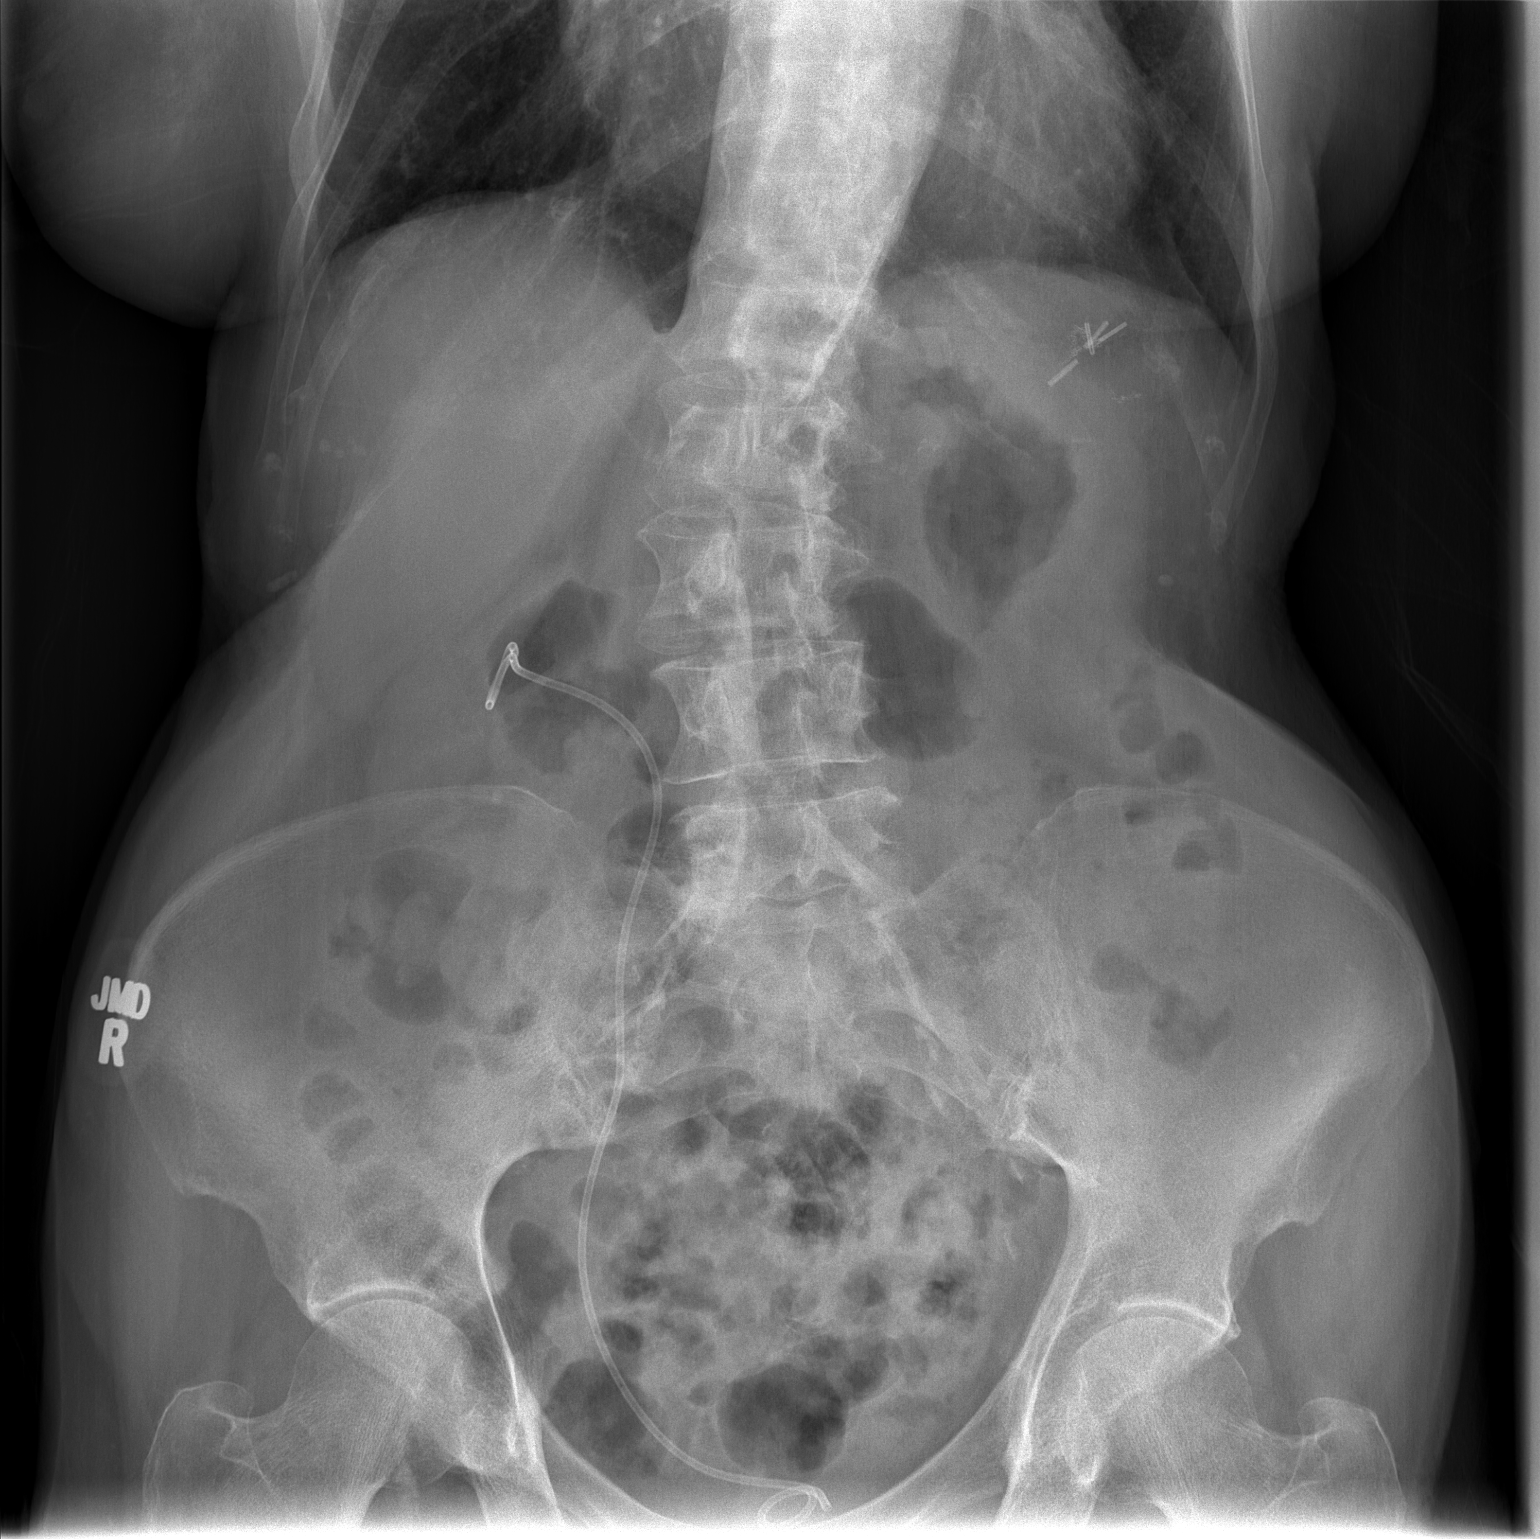

[1 of 1 positions shown; findings below may reference images not displayed]

FINDINGS: Nonspecific bowel gas pattern. Surgical clips noted left upper
quadrant. Right internal ureteral stent is evident. Proximal to mid
right ureteral stone seen on the previous CT scan is not definitely
visualized on today's x-ray. Degenerative changes are noted in the
lumbar spine. Atherosclerotic calcification evident in the aorta.
IMPRESSION: Right ureteral stone not definitely visualized on this study.

## 2017-12-13 ENCOUNTER — Other Ambulatory Visit: Payer: Self-pay | Admitting: Internal Medicine

## 2017-12-13 NOTE — Telephone Encounter (Signed)
Requested medication (s) are due for refill today:   Yes  Requested medication (s) are on the active medication list:   Yes  Future visit scheduled:   Yes  01/01/18 with Plotnikov   Last ordered: 07/06/17  #30 with 2 refills   Requested Prescriptions  Pending Prescriptions Disp Refills   clonazePAM (KLONOPIN) 0.5 MG tablet 30 tablet 2    Sig: TAKE 1 TABLET BY MOUTH AT BEDTIME AS NEEDED FOR LEG CRAMPS     Not Delegated - Psychiatry:  Anxiolytics/Hypnotics Failed - 12/13/2017  2:02 PM      Failed - This refill cannot be delegated      Failed - Urine Drug Screen completed in last 360 days.      Passed - Valid encounter within last 6 months    Recent Outpatient Visits          4 months ago Essential hypertension   [place], MD   6 months ago Paroxysmal atrial fibrillation [facility])   [place], MD   6 months ago Dizziness and giddiness   [facility] [place], Malka So, MD   8 months ago Grief at loss of child   [place], Evie Lacks, MD   1 year ago COPD with chronic bronchitis [facility])   [place], MD      Future Appointments            In 2 weeks Plotnikov, Evie Lacks, MD [place], [place]

## 2017-12-13 NOTE — Telephone Encounter (Signed)
Copied from [place] [id number]. Topic: Quick Communication - See Telephone Encounter >> Dec 13, 2017  1:23 PM Hewitt Shorts wrote: Pt is needing a refill on clonazepam   [facility] [address]   Best number [phone]

## 2017-12-14 MED ORDER — CLONAZEPAM 0.5 MG PO TABS: ORAL_TABLET | ORAL | 3 refills | Status: DC

## 2017-12-14 NOTE — Telephone Encounter (Signed)
MD approved and sent electronically to pof../lmb  

## 2018-01-01 ENCOUNTER — Ambulatory Visit (INDEPENDENT_AMBULATORY_CARE_PROVIDER_SITE_OTHER): Payer: Medicare Other | Admitting: Internal Medicine

## 2018-01-01 ENCOUNTER — Encounter: Payer: Self-pay | Admitting: Internal Medicine

## 2018-01-01 ENCOUNTER — Other Ambulatory Visit (INDEPENDENT_AMBULATORY_CARE_PROVIDER_SITE_OTHER): Payer: Medicare Other

## 2018-01-01 VITALS — BP 162/78 | HR 62 | Temp 98.0°F | Ht 64.45 in | Wt 139.0 lb

## 2018-01-01 DIAGNOSIS — R140 Abdominal distension (gaseous): Secondary | ICD-10-CM | POA: Diagnosis not present

## 2018-01-01 DIAGNOSIS — E538 Deficiency of other specified B group vitamins: Secondary | ICD-10-CM | POA: Diagnosis not present

## 2018-01-01 DIAGNOSIS — Z23 Encounter for immunization: Secondary | ICD-10-CM | POA: Diagnosis not present

## 2018-01-01 DIAGNOSIS — K219 Gastro-esophageal reflux disease without esophagitis: Secondary | ICD-10-CM | POA: Diagnosis not present

## 2018-01-01 DIAGNOSIS — D649 Anemia, unspecified: Secondary | ICD-10-CM

## 2018-01-01 DIAGNOSIS — R635 Abnormal weight gain: Secondary | ICD-10-CM

## 2018-01-01 DIAGNOSIS — J449 Chronic obstructive pulmonary disease, unspecified: Secondary | ICD-10-CM

## 2018-01-01 DIAGNOSIS — R1310 Dysphagia, unspecified: Secondary | ICD-10-CM

## 2018-01-01 LAB — HEPATIC FUNCTION PANEL
ALT: 9 U/L (ref 0–35)
AST: 17 U/L (ref 0–37)
Albumin: 4 g/dL (ref 3.5–5.2)
Alkaline Phosphatase: 61 U/L (ref 39–117)
Bilirubin, Direct: 0 mg/dL (ref 0.0–0.3)
Total Bilirubin: 0.3 mg/dL (ref 0.2–1.2)
Total Protein: 7 g/dL (ref 6.0–8.3)

## 2018-01-01 LAB — CBC WITH DIFFERENTIAL/PLATELET
Basophils Absolute: 0.1 10*3/uL (ref 0.0–0.1)
Basophils Relative: 1.1 % (ref 0.0–3.0)
Eosinophils Absolute: 0.7 10*3/uL (ref 0.0–0.7)
Eosinophils Relative: 6.7 % — ABNORMAL HIGH (ref 0.0–5.0)
HCT: 37.7 % (ref 36.0–46.0)
Hemoglobin: 12.3 g/dL (ref 12.0–15.0)
Lymphocytes Relative: 27.8 % (ref 12.0–46.0)
Lymphs Abs: 3 10*3/uL (ref 0.7–4.0)
MCHC: 32.6 g/dL (ref 30.0–36.0)
MCV: 91.3 fl (ref 78.0–100.0)
Monocytes Absolute: 1.2 10*3/uL — ABNORMAL HIGH (ref 0.1–1.0)
Monocytes Relative: 11.2 % (ref 3.0–12.0)
Neutro Abs: 5.8 10*3/uL (ref 1.4–7.7)
Neutrophils Relative %: 53.2 % (ref 43.0–77.0)
Platelets: 367 10*3/uL (ref 150.0–400.0)
RBC: 4.13 Mil/uL (ref 3.87–5.11)
RDW: 15 % (ref 11.5–15.5)
WBC: 10.9 10*3/uL — ABNORMAL HIGH (ref 4.0–10.5)

## 2018-01-01 LAB — BASIC METABOLIC PANEL
BUN: 24 mg/dL — ABNORMAL HIGH (ref 6–23)
CO2: 27 mEq/L (ref 19–32)
Calcium: 9.5 mg/dL (ref 8.4–10.5)
Chloride: 109 mEq/L (ref 96–112)
Creatinine, Ser: 1.46 mg/dL — ABNORMAL HIGH (ref 0.40–1.20)
GFR: 36.64 mL/min — ABNORMAL LOW (ref >60.00)
Glucose, Bld: 96 mg/dL (ref 70–99)
Potassium: 5.9 mEq/L — ABNORMAL HIGH (ref 3.5–5.1)
Sodium: 143 mEq/L (ref 135–145)

## 2018-01-01 LAB — TSH: TSH: 1.91 u[IU]/mL (ref 0.35–4.50)

## 2018-01-01 MED ORDER — PANTOPRAZOLE SODIUM 40 MG PO TBEC: 40.0000 mg | DELAYED_RELEASE_TABLET | Freq: Every day | ORAL | 11 refills | Status: DC

## 2018-01-01 MED ORDER — IPRATROPIUM-ALBUTEROL 0.5-2.5 (3) MG/3ML IN SOLN: 3.0000 mL | Freq: Four times a day (QID) | RESPIRATORY_TRACT | 3 refills | Status: DC | PRN

## 2018-01-01 NOTE — Assessment & Plan Note (Signed)
Better diet discussed GI ref Labs

## 2018-01-01 NOTE — Assessment & Plan Note (Signed)
On B12 

## 2018-01-01 NOTE — Assessment & Plan Note (Signed)
Pt was offered a [facility] Rx

## 2018-01-01 NOTE — Assessment & Plan Note (Signed)
H/o esoph stricture - Dr Fuller Plan ref OTC Omeprazole - d/c. Start Aciphex

## 2018-01-01 NOTE — Progress Notes (Signed)
Subjective:  Patient ID: [name], female    DOB: 12-17-1935  Age: 82 y.o. MRN: [id number]  CC: No chief complaint on file.   HPI [name] presents for GERD, anxiety, COPD f/u GERD is worse - taking 2 Prilosec OTC/day. Chocking on food C/o abd bloating, wt gain. Diet: hot dogs, frozen food, lemonade, sweets  Outpatient Medications Prior to Visit  Medication Sig Dispense Refill  . Aspirin-Salicylamide-Caffeine (BC HEADACHE POWDER PO) Take 1 packet by mouth daily as needed (headaches).    . Biotin 1000 MCG tablet Take 1,000 mcg by mouth daily.    . Cholecalciferol ([employer] VITAMIN D3) 1000 UNITS capsule Take 1,000 Units by mouth daily.      . clonazePAM (KLONOPIN) 0.5 MG tablet TAKE 1 TABLET BY MOUTH AT BEDTIME AS NEEDED FOR LEG CRAMPS 30 tablet 3  . cyanocobalamin 1000 MCG tablet Take 1,000 mcg by mouth daily.    . flecainide (TAMBOCOR) 50 MG tablet TAKE 1 TABLET BY MOUTH TWICE DAILY 180 tablet 3  . metoprolol tartrate (LOPRESSOR) 25 MG tablet Take 0.5 tablets (12.5 mg total) by mouth 2 (two) times daily. 90 tablet 1  . omeprazole (PRILOSEC) 20 MG capsule Take 20 mg by mouth daily.    Marland Kitchen umeclidinium-vilanterol (ANORO ELLIPTA) 62.5-25 MCG/INH AEPB Inhale 1 puff into the lungs daily. 3 each 3  . amLODipine (NORVASC) 5 MG tablet Take 1 tablet (5 mg total) by mouth daily. 90 tablet 3   No facility-administered medications prior to visit.     ROS: Review of Systems  Constitutional: Positive for fatigue and unexpected weight change. Negative for activity change, appetite change and chills.  HENT: Negative for congestion, mouth sores and sinus pressure.   Eyes: Negative for visual disturbance.  Respiratory: Positive for shortness of breath. Negative for cough and chest tightness.   Gastrointestinal: Positive for abdominal distention and constipation. Negative for abdominal pain and nausea.  Genitourinary: Negative for difficulty urinating, frequency and vaginal pain.    Musculoskeletal: Positive for arthralgias, back pain and gait problem.  Skin: Negative for pallor and rash.  Neurological: Negative for dizziness, tremors, weakness, numbness and headaches.  Psychiatric/Behavioral: Positive for decreased concentration. Negative for confusion and sleep disturbance. The patient is nervous/anxious.     Objective:  BP (!) 162/78 (BP Location: Left Arm, Patient Position: Sitting, Cuff Size: Normal)   Pulse 62   Temp 98 F (36.7 C) (Oral)   Ht 5' 4.45" (1.637 m)   Wt 139 lb (63 kg)   SpO2 94%   BMI 23.53 kg/m   BP Readings from Last 3 Encounters:  01/01/18 (!) 162/78  08/14/17 132/78  06/25/17 (!) 158/68    Wt Readings from Last 3 Encounters:  01/01/18 139 lb (63 kg)  08/14/17 131 lb (59.4 kg)  06/25/17 127 lb 3.2 oz (57.7 kg)    Physical Exam  Constitutional: She appears well-developed. No distress.  HENT:  Head: Normocephalic.  Right Ear: External ear normal.  Left Ear: External ear normal.  Nose: Nose normal.  Mouth/Throat: Oropharynx is clear and moist.  Eyes: Pupils are equal, round, and reactive to light. Conjunctivae are normal. Right eye exhibits no discharge. Left eye exhibits no discharge.  Neck: Normal range of motion. Neck supple. No JVD present. No tracheal deviation present. No thyromegaly present.  Cardiovascular: Normal rate, regular rhythm and normal heart sounds.  Pulmonary/Chest: No stridor. No respiratory distress. She has no wheezes.  Abdominal: Soft. Bowel sounds are normal. She exhibits no distension  and no mass. There is no tenderness. There is no rebound and no guarding.  Musculoskeletal: She exhibits no edema or tenderness.  Lymphadenopathy:    She has no cervical adenopathy.  Neurological: She displays normal reflexes. No cranial nerve deficit. She exhibits normal muscle tone. Coordination normal.  Skin: No rash noted. No erythema.  Psychiatric: She has a normal mood and affect. Her behavior is normal. Judgment  and thought content normal.  abd is round, sensitive in lower 1/2  Lab Results  Component Value Date   WBC 11.5 (H) 05/21/2017   HGB 11.0 (L) 05/21/2017   HCT 34.8 (L) 05/21/2017   PLT 379.0 05/21/2017   GLUCOSE 84 06/18/2017   CHOL 199 03/21/2013   TRIG 107.0 03/21/2013   HDL 57.50 03/21/2013   LDLDIRECT 144.3 01/31/2011   LDLCALC 120 (H) 03/21/2013   ALT 12 08/23/2016   AST 19 08/23/2016   NA 140 06/18/2017   K 5.0 06/18/2017   CL 110 06/18/2017   CREATININE 1.52 (H) 06/18/2017   BUN 28 (H) 06/18/2017   CO2 24 06/18/2017   TSH 1.19 03/20/2014   INR 0.9 08/13/2008    Ct Soft Tissue Neck W Contrast  Result Date: 06/26/2017 CLINICAL DATA:  82 y/o F; painful dysphagia, hoarseness, and headaches. Evaluate for left neck mass. EXAM: CT NECK WITH CONTRAST TECHNIQUE: Multidetector CT imaging of the neck was performed using the standard protocol following the bolus administration of intravenous contrast. CONTRAST:  8mL ISOVUE-300 IOPAMIDOL (ISOVUE-300) INJECTION 61% COMPARISON:  None. FINDINGS: Pharynx and larynx: Normal. No exophytic mass, abnormal mucosal enhancement, or swelling. Salivary glands: No inflammation, mass, or stone. Thyroid: Normal. Lymph nodes: None enlarged or abnormal density. Vascular: Calcific atherosclerosis of the aortic arch and carotid bifurcations. The left proximal ICA is mildly tortuous extending laterally in the neck soft tissues deep to the sternocleidomastoid muscle at the level of palpable abnormality (series 2, image 16). Limited intracranial: Negative. Visualized orbits: Negative. Mastoids and visualized paranasal sinuses: Clear. Skeleton: No acute or aggressive process. Cervical spondylosis with multilevel disc and facet degenerative changes. No high-grade bony canal stenosis. Mild reversal of cervical curvature. Upper chest: Moderate centrilobular emphysema of the upper lungs. 2 mm right upper lobe pulmonary nodule (series 3, image 31). Biapical mild  pleuroparenchymal scarring. Other: None. IMPRESSION: 1. No mass or inflammatory process of the neck identified. 2. Left proximal ICA is mildly tortuous extending laterally in the neck soft tissues deep to sternocleidomastoid muscle at level of palpable abnormality. Correlate for pulsatility of palpable abnormality. 3. Moderate centrilobular emphysema of upper lungs. 4. 2 mm pulmonary nodule and right upper lobe. No follow-up needed if patient is low-risk. Non-contrast chest CT can be considered in 12 months if patient is high-risk. This recommendation follows the consensus statement: Guidelines for Management of Incidental Pulmonary Nodules Detected on CT Images: From the Fleischner Society 2017; Radiology 2017; 284:228-243. Electronically Signed   By: Kristine Garbe M.D.   On: 06/26/2017 16:05    Assessment & Plan:   There are no diagnoses linked to this encounter.   No orders of the defined types were placed in this encounter.    Follow-up: No follow-ups on file.  Walker Kehr, MD

## 2018-01-01 NOTE — Assessment & Plan Note (Signed)
H/o esoph stricture - Dr Fuller Plan Omeprazole - d/c. Start Aciphex  GI ref

## 2018-01-02 LAB — IRON,TIBC AND FERRITIN PANEL
%SAT: 19 % (calc) (ref 16–45)
Ferritin: 13 ng/mL — ABNORMAL LOW (ref 16–288)
Iron: 84 ug/dL (ref 45–160)
TIBC: 446 mcg/dL (calc) (ref 250–450)

## 2018-01-10 ENCOUNTER — Ambulatory Visit: Payer: Self-pay | Admitting: Gastroenterology

## 2018-01-11 ENCOUNTER — Ambulatory Visit: Payer: Medicare Other | Admitting: Gastroenterology

## 2018-01-11 ENCOUNTER — Ambulatory Visit: Payer: Self-pay | Admitting: Gastroenterology

## 2018-01-11 ENCOUNTER — Encounter: Payer: Self-pay | Admitting: Gastroenterology

## 2018-01-11 VITALS — BP 122/80 | HR 58 | Ht 63.0 in | Wt 138.0 lb

## 2018-01-11 DIAGNOSIS — K224 Dyskinesia of esophagus: Secondary | ICD-10-CM

## 2018-01-11 DIAGNOSIS — R1310 Dysphagia, unspecified: Secondary | ICD-10-CM | POA: Diagnosis not present

## 2018-01-11 NOTE — Patient Instructions (Signed)
If you are age 80 or older, your body mass index should be between 23-30. Your Body mass index is 24.45 kg/m. If this is out of the aforementioned range listed, please consider follow up with your Primary Care Provider.  If you are age 40 or younger, your body mass index should be between 19-25. Your Body mass index is 24.45 kg/m. If this is out of the aformentioned range listed, please consider follow up with your Primary Care Provider.   Start Pantoprazole daily.  Eat slowly; chew food well.  Follow up as needed.  Thank you for choosing me and [facility].   Alonza Bogus, PA-C

## 2018-01-11 NOTE — Progress Notes (Addendum)
01/11/2018 [name] [id number] 1935-08-01   HISTORY OF PRESENT ILLNESS: This is an 82 year old female who I saw about 2-1/2 years ago for complaints of dysphasia.  At that time she admitted that she did not have well fitting dentures and was not able to chew her food well, which we assumed was contributing to this.  She underwent an esophagram that showed nonspecific esophageal motility disorder but was otherwise normal.  She presents here today with her sister reporting the same complaints that she had back then.  She reports intermittent issues with swallowing certain foods such as meats.  Then when she tries to swallow water and push it down the water gurgles back up.  She says that her dentures are still not fitting well.  Denies weight loss, in fact, she has gained over 25 pounds since I saw her last time.  She says that her PCP gave her pantoprazole 40 daily to try but she has not started taking it.   Past Medical History:  Diagnosis Date  . Anxiety   . Arthritis   . Atrial fibrillation ([place])   . B12 deficiency   . COPD (chronic obstructive pulmonary disease) ([place])   . Depression   . GERD (gastroesophageal reflux disease)   . Hypertension   . Osteoarthrosis, hand    both hands  . Peptic ulcer, unspecified site, unspecified as acute or chronic, without mention of hemorrhage, perforation, or obstruction   . Pneumonia   . PONV (postoperative nausea and vomiting)   . Vitamin D deficiency disease    Past Surgical History:  Procedure Laterality Date  . BLADDER SURGERY     Bladder tack and intestines  . CYSTOCELE REPAIR    . CYSTOSCOPY WITH RETROGRADE PYELOGRAM, URETEROSCOPY AND STENT PLACEMENT Right 09/11/2016   Procedure: CYSTOSCOPY WITH RETROGRADE PYELOGRAM, URETEROSCOPY AND STENT REPLACEMENT;  Surgeon: Cleon Gustin, MD;  Location: [facility];  Service: Urology;  Laterality: Right;  . CYSTOSCOPY WITH STENT PLACEMENT Right 08/11/2016   Procedure: CYSTOSCOPY, URETERSCOPY,   RIGHT RETROGRADE WITH RIGHT STENT PLACEMENT;  Surgeon: Festus Aloe, MD;  Location: [facility];  Service: Urology;  Laterality: Right;  . SPLENECTOMY      reports that she quit smoking about 9 years ago. Her smoking use included cigarettes. She smoked 1.00 pack per day. She has never used smokeless tobacco. She reports that she does not drink alcohol or use drugs. family history includes Arthritis in her unknown relative; Coronary artery disease in her unknown relative; Diabetes in her daughter; Heart disease in her father; Hypertension in her unknown relative. Allergies  Allergen Reactions  . Amoxicillin Other (See Comments)    "makes me crazy" Has patient had a PCN reaction causing immediate rash, facial/tongue/throat swelling, SOB or lightheadedness with hypotension: No Has patient had a PCN reaction causing severe rash involving mucus membranes or skin necrosis: No Has patient had a PCN reaction that required hospitalization: No Has patient had a PCN reaction occurring within the last 10 years: No If all of the above answers are "NO", then may proceed with Cephalosporin use.   . Codeine Nausea And Vomiting  . Levaquin [Levofloxacin In D5w] Nausea And Vomiting  . Other Nausea And Vomiting    Pt does not want to take any pain meds      Outpatient Encounter Medications as of 01/11/2018  Medication Sig  . Aspirin-Salicylamide-Caffeine (BC HEADACHE POWDER PO) Take 1 packet by mouth daily as needed (headaches).  . Biotin 1000 MCG  tablet Take 1,000 mcg by mouth daily.  . Cholecalciferol ([employer]S VITAMIN D3) 1000 UNITS capsule Take 1,000 Units by mouth daily.    . clonazePAM (KLONOPIN) 0.5 MG tablet TAKE 1 TABLET BY MOUTH AT BEDTIME AS NEEDED FOR LEG CRAMPS  . cyanocobalamin 1000 MCG tablet Take 1,000 mcg by mouth daily.  . flecainide (TAMBOCOR) 50 MG tablet TAKE 1 TABLET BY MOUTH TWICE DAILY  . ipratropium-albuterol (DUONEB) 0.5-2.5 (3) MG/3ML SOLN Take 3 mLs by nebulization every 6 (six)  hours as needed.  . metoprolol tartrate (LOPRESSOR) 25 MG tablet Take 0.5 tablets (12.5 mg total) by mouth 2 (two) times daily.  . pantoprazole (PROTONIX) 40 MG tablet Take 1 tablet (40 mg total) by mouth daily.  Marland Kitchen amLODipine (NORVASC) 5 MG tablet Take 1 tablet (5 mg total) by mouth daily.   No facility-administered encounter medications on file as of 01/11/2018.      REVIEW OF SYSTEMS  : All other systems reviewed and negative except where noted in the History of Present Illness.   PHYSICAL EXAM: BP 122/80   Pulse (!) 58   Ht 5\' 3"  (1.6 m)   Wt 138 lb (62.6 kg)   BMI 24.45 kg/m  General: Well developed white female in no acute distress Head: Normocephalic and atraumatic Eyes:  Sclerae anicteric, conjunctiva pink. Ears: Normal auditory acuity Lungs: Clear throughout to auscultation; no increased WOB. Heart: Regular rate and rhythm; no M/R/G. Abdomen: Soft, nontender, non distended. No masses or hepatomegaly noted. Normal bowel sounds Musculoskeletal: Symmetrical with no gross deformities  Skin: No lesions on visible extremities Extremities: No edema  Neurological: Alert oriented x 4, grossly non-focal Psychological:  Alert and cooperative. Normal mood and affect  ASSESSMENT AND PLAN: *Dysphagia:  Secondary to esophageal dysphagia.  These are the same symptoms that she had when I saw her 2.5 years ago and esophagram showed non-specific esophageal motility disorder.  She still does not have well-fitting dentures either.  Advised to take small bites, chew food well, eat slowly, avoid hard to swallow foods.  Was given pantoprazole 40 mg daily to begin taking from her PCP, which she has not yet done.  I advised her to try it for 4 weeks or so to see if it reduces frequency of symptoms.   CC:  Plotnikov, Evie Lacks, MD   Addendum: Reviewed and agree with assessment and management plan. Pyrtle, Lajuan Lines, MD

## 2018-01-16 MED ORDER — IPRATROPIUM-ALBUTEROL 0.5-2.5 (3) MG/3ML IN SOLN: 3.0000 mL | Freq: Four times a day (QID) | RESPIRATORY_TRACT | 3 refills | Status: DC | PRN

## 2018-01-16 NOTE — Addendum Note (Signed)
Addended by: Karren Cobble on: 01/16/2018 01:57 PM   Modules accepted: Orders

## 2018-01-18 ENCOUNTER — Telehealth: Payer: Self-pay | Admitting: Internal Medicine

## 2018-01-18 DIAGNOSIS — J449 Chronic obstructive pulmonary disease, unspecified: Secondary | ICD-10-CM | POA: Diagnosis not present

## 2018-01-18 DIAGNOSIS — J438 Other emphysema: Secondary | ICD-10-CM | POA: Diagnosis not present

## 2018-01-18 NOTE — Telephone Encounter (Signed)
Copied from Tina [id number]. Topic: Quick Communication - Rx Refill/Question >> Jan 18, 2018  4:17 PM Rayann Heman wrote: Medication: pt called and stated that she picked up nebulizer from [facility] but pt states that she needs an RX for the medication that goes in the nebulizer. Please advise [phone]

## 2018-01-21 MED ORDER — IPRATROPIUM-ALBUTEROL 0.5-2.5 (3) MG/3ML IN SOLN: 3.0000 mL | Freq: Four times a day (QID) | RESPIRATORY_TRACT | 3 refills | Status: DC | PRN

## 2018-01-21 NOTE — Telephone Encounter (Signed)
LVM letting pt know RX was at pharmacy for neb solution.

## 2018-02-17 DIAGNOSIS — J449 Chronic obstructive pulmonary disease, unspecified: Secondary | ICD-10-CM | POA: Diagnosis not present

## 2018-02-18 ENCOUNTER — Ambulatory Visit: Payer: Self-pay | Admitting: Internal Medicine

## 2018-03-20 DIAGNOSIS — J449 Chronic obstructive pulmonary disease, unspecified: Secondary | ICD-10-CM | POA: Diagnosis not present

## 2018-04-08 DIAGNOSIS — H353132 Nonexudative age-related macular degeneration, bilateral, intermediate dry stage: Secondary | ICD-10-CM | POA: Diagnosis not present

## 2018-04-08 DIAGNOSIS — H26491 Other secondary cataract, right eye: Secondary | ICD-10-CM | POA: Diagnosis not present

## 2018-04-08 DIAGNOSIS — H16223 Keratoconjunctivitis sicca, not specified as Sjogren's, bilateral: Secondary | ICD-10-CM | POA: Diagnosis not present

## 2018-04-08 DIAGNOSIS — Z961 Presence of intraocular lens: Secondary | ICD-10-CM | POA: Diagnosis not present

## 2018-04-09 ENCOUNTER — Telehealth: Payer: Self-pay | Admitting: Emergency Medicine

## 2018-04-09 ENCOUNTER — Other Ambulatory Visit (INDEPENDENT_AMBULATORY_CARE_PROVIDER_SITE_OTHER): Payer: Medicare Other

## 2018-04-09 ENCOUNTER — Encounter: Payer: Self-pay | Admitting: Internal Medicine

## 2018-04-09 ENCOUNTER — Ambulatory Visit (INDEPENDENT_AMBULATORY_CARE_PROVIDER_SITE_OTHER): Payer: Medicare Other | Admitting: Internal Medicine

## 2018-04-09 VITALS — BP 138/82 | HR 57 | Temp 98.1°F | Ht 63.0 in | Wt 142.0 lb

## 2018-04-09 DIAGNOSIS — E559 Vitamin D deficiency, unspecified: Secondary | ICD-10-CM

## 2018-04-09 DIAGNOSIS — J449 Chronic obstructive pulmonary disease, unspecified: Secondary | ICD-10-CM

## 2018-04-09 DIAGNOSIS — I10 Essential (primary) hypertension: Secondary | ICD-10-CM

## 2018-04-09 DIAGNOSIS — Z23 Encounter for immunization: Secondary | ICD-10-CM

## 2018-04-09 DIAGNOSIS — E538 Deficiency of other specified B group vitamins: Secondary | ICD-10-CM | POA: Diagnosis not present

## 2018-04-09 DIAGNOSIS — F411 Generalized anxiety disorder: Secondary | ICD-10-CM

## 2018-04-09 DIAGNOSIS — I480 Paroxysmal atrial fibrillation: Secondary | ICD-10-CM | POA: Diagnosis not present

## 2018-04-09 LAB — BASIC METABOLIC PANEL
BUN: 28 mg/dL — ABNORMAL HIGH (ref 6–23)
CO2: 26 mEq/L (ref 19–32)
Calcium: 10 mg/dL (ref 8.4–10.5)
Chloride: 108 mEq/L (ref 96–112)
Creatinine, Ser: 1.41 mg/dL — ABNORMAL HIGH (ref 0.40–1.20)
GFR: 35.86 mL/min — ABNORMAL LOW (ref >60.00)
Glucose, Bld: 64 mg/dL — ABNORMAL LOW (ref 70–99)
Potassium: 5 mEq/L (ref 3.5–5.1)
Sodium: 142 mEq/L (ref 135–145)

## 2018-04-09 LAB — VITAMIN D 25 HYDROXY (VIT D DEFICIENCY, FRACTURES): VITD: 57.1 ng/mL (ref 30.00–100.00)

## 2018-04-09 LAB — VITAMIN B12: Vitamin B-12: >1525 pg/mL — ABNORMAL HIGH (ref 211–911)

## 2018-04-09 NOTE — Telephone Encounter (Signed)
Pt came back up from lab and wanted to know if you were going to call her in something for a yeast infection. Please call and let patient know if so thanks.

## 2018-04-09 NOTE — Assessment & Plan Note (Signed)
Doing well 

## 2018-04-09 NOTE — Assessment & Plan Note (Signed)
Vit B12 

## 2018-04-09 NOTE — Assessment & Plan Note (Signed)
On Lopressor, ASA, Flecainide Dr Stanford Breed

## 2018-04-09 NOTE — Assessment & Plan Note (Signed)
On Clonazepam prn. Not to take w/Lorazepam   Potential benefits of a long term benzodiazepines  use as well as potential risks  and complications were explained to the patient and were aknowledged.

## 2018-04-09 NOTE — Assessment & Plan Note (Signed)
Vit D 

## 2018-04-09 NOTE — Assessment & Plan Note (Signed)
Lopressor

## 2018-04-09 NOTE — Progress Notes (Signed)
Subjective:  Patient ID: [name], female    DOB: May 14, 1935  Age: 83 y.o. MRN: [id number]  CC: No chief complaint on file.   HPI [name] presents for COPD, anxiety, HTN f/u  Outpatient Medications Prior to Visit  Medication Sig Dispense Refill  . Aspirin-Salicylamide-Caffeine (BC HEADACHE POWDER PO) Take 1 packet by mouth daily as needed (headaches).    . Biotin 1000 MCG tablet Take 1,000 mcg by mouth daily.    . Cholecalciferol ([employer]S VITAMIN D3) 1000 UNITS capsule Take 1,000 Units by mouth daily.      . clonazePAM (KLONOPIN) 0.5 MG tablet TAKE 1 TABLET BY MOUTH AT BEDTIME AS NEEDED FOR LEG CRAMPS 30 tablet 3  . cyanocobalamin 1000 MCG tablet Take 1,000 mcg by mouth daily.    . flecainide (TAMBOCOR) 50 MG tablet TAKE 1 TABLET BY MOUTH TWICE DAILY 180 tablet 3  . metoprolol tartrate (LOPRESSOR) 25 MG tablet Take 0.5 tablets (12.5 mg total) by mouth 2 (two) times daily. 90 tablet 1  . pantoprazole (PROTONIX) 40 MG tablet Take 1 tablet (40 mg total) by mouth daily. 30 tablet 11  . amLODipine (NORVASC) 5 MG tablet Take 1 tablet (5 mg total) by mouth daily. 90 tablet 3  . ipratropium-albuterol (DUONEB) 0.5-2.5 (3) MG/3ML SOLN Take 3 mLs by nebulization every 6 (six) hours as needed. 360 mL 3   No facility-administered medications prior to visit.     ROS: Review of Systems  Constitutional: Positive for unexpected weight change. Negative for activity change, appetite change, chills and fatigue.  HENT: Negative for congestion, mouth sores and sinus pressure.   Eyes: Negative for visual disturbance.  Respiratory: Negative for cough and chest tightness.   Gastrointestinal: Negative for abdominal pain and nausea.  Genitourinary: Negative for difficulty urinating, frequency and vaginal pain.  Musculoskeletal: Negative for back pain and gait problem.  Skin: Negative for pallor and rash.  Neurological: Negative for dizziness, tremors, weakness, numbness and headaches.    Psychiatric/Behavioral: Negative for confusion, sleep disturbance and suicidal ideas. The patient is nervous/anxious.     Objective:  BP 138/82 (BP Location: Left Arm, Patient Position: Sitting, Cuff Size: Normal)   Pulse (!) 57   Temp 98.1 F (36.7 C) (Oral)   Ht 5\' 3"  (1.6 m)   Wt 142 lb (64.4 kg)   SpO2 94%   BMI 25.15 kg/m   BP Readings from Last 3 Encounters:  04/09/18 138/82  01/11/18 122/80  01/01/18 (!) 162/78    Wt Readings from Last 3 Encounters:  04/09/18 142 lb (64.4 kg)  01/11/18 138 lb (62.6 kg)  01/01/18 139 lb (63 kg)    Physical Exam Constitutional:      General: She is not in acute distress.    Appearance: She is well-developed.  HENT:     Head: Normocephalic.     Right Ear: External ear normal.     Left Ear: External ear normal.     Nose: Nose normal.  Eyes:     General:        Right eye: No discharge.        Left eye: No discharge.     Conjunctiva/sclera: Conjunctivae normal.     Pupils: Pupils are equal, round, and reactive to light.  Neck:     Musculoskeletal: Normal range of motion and neck supple.     Thyroid: No thyromegaly.     Vascular: No JVD.     Trachea: No tracheal deviation.  Cardiovascular:  Rate and Rhythm: Tachycardia present.     Heart sounds: Normal heart sounds.  Pulmonary:     Effort: No respiratory distress.     Breath sounds: No stridor. No wheezing.  Abdominal:     General: Bowel sounds are normal. There is no distension.     Palpations: Abdomen is soft. There is no mass.     Tenderness: There is no abdominal tenderness. There is no guarding or rebound.  Musculoskeletal:        General: No tenderness.  Lymphadenopathy:     Cervical: No cervical adenopathy.  Skin:    Findings: No erythema or rash.  Neurological:     Cranial Nerves: No cranial nerve deficit.     Motor: No abnormal muscle tone.     Coordination: Coordination normal.     Deep Tendon Reflexes: Reflexes normal.  Psychiatric:        Behavior:  Behavior normal.        Thought Content: Thought content normal.        Judgment: Judgment normal.     Lab Results  Component Value Date   WBC 10.9 (H) 01/01/2018   HGB 12.3 01/01/2018   HCT 37.7 01/01/2018   PLT 367.0 01/01/2018   GLUCOSE 96 01/01/2018   CHOL 199 03/21/2013   TRIG 107.0 03/21/2013   HDL 57.50 03/21/2013   LDLDIRECT 144.3 01/31/2011   LDLCALC 120 (H) 03/21/2013   ALT 9 01/01/2018   AST 17 01/01/2018   NA 143 01/01/2018   K 5.9 (H) 01/01/2018   CL 109 01/01/2018   CREATININE 1.46 (H) 01/01/2018   BUN 24 (H) 01/01/2018   CO2 27 01/01/2018   TSH 1.91 01/01/2018   INR 0.9 08/13/2008    Ct Soft Tissue Neck W Contrast  Result Date: 06/26/2017 CLINICAL DATA:  83 y/o F; painful dysphagia, hoarseness, and headaches. Evaluate for left neck mass. EXAM: CT NECK WITH CONTRAST TECHNIQUE: Multidetector CT imaging of the neck was performed using the standard protocol following the bolus administration of intravenous contrast. CONTRAST:  61mL ISOVUE-300 IOPAMIDOL (ISOVUE-300) INJECTION 61% COMPARISON:  None. FINDINGS: Pharynx and larynx: Normal. No exophytic mass, abnormal mucosal enhancement, or swelling. Salivary glands: No inflammation, mass, or stone. Thyroid: Normal. Lymph nodes: None enlarged or abnormal density. Vascular: Calcific atherosclerosis of the aortic arch and carotid bifurcations. The left proximal ICA is mildly tortuous extending laterally in the neck soft tissues deep to the sternocleidomastoid muscle at the level of palpable abnormality (series 2, image 16). Limited intracranial: Negative. Visualized orbits: Negative. Mastoids and visualized paranasal sinuses: Clear. Skeleton: No acute or aggressive process. Cervical spondylosis with multilevel disc and facet degenerative changes. No high-grade bony canal stenosis. Mild reversal of cervical curvature. Upper chest: Moderate centrilobular emphysema of the upper lungs. 2 mm right upper lobe pulmonary nodule (series 3,  image 31). Biapical mild pleuroparenchymal scarring. Other: None. IMPRESSION: 1. No mass or inflammatory process of the neck identified. 2. Left proximal ICA is mildly tortuous extending laterally in the neck soft tissues deep to sternocleidomastoid muscle at level of palpable abnormality. Correlate for pulsatility of palpable abnormality. 3. Moderate centrilobular emphysema of upper lungs. 4. 2 mm pulmonary nodule and right upper lobe. No follow-up needed if patient is low-risk. Non-contrast chest CT can be considered in 12 months if patient is high-risk. This recommendation follows the consensus statement: Guidelines for Management of Incidental Pulmonary Nodules Detected on CT Images: From the Fleischner Society 2017; Radiology 2017; 284:228-243. Electronically Signed   By: Mia [place]  [place] M.D.   On: 06/26/2017 16:05    Assessment & Plan:   There are no diagnoses linked to this encounter.   No orders of the defined types were placed in this encounter.    Follow-up: No follow-ups on file.  Walker Kehr, MD

## 2018-04-11 NOTE — Telephone Encounter (Signed)
Message sent to dr. Alain Marion in a different message

## 2018-04-20 DIAGNOSIS — J449 Chronic obstructive pulmonary disease, unspecified: Secondary | ICD-10-CM | POA: Diagnosis not present

## 2018-04-29 ENCOUNTER — Telehealth: Payer: Self-pay | Admitting: *Deleted

## 2018-04-29 ENCOUNTER — Other Ambulatory Visit: Payer: Self-pay

## 2018-04-29 ENCOUNTER — Encounter (HOSPITAL_COMMUNITY): Payer: Self-pay

## 2018-04-29 ENCOUNTER — Emergency Department (HOSPITAL_COMMUNITY)
Admission: EM | Admit: 2018-04-29 | Discharge: 2018-04-29 | Disposition: A | Discharge status: Home / Self Care | Payer: Medicare Other | Attending: Emergency Medicine | Admitting: Emergency Medicine

## 2018-04-29 ENCOUNTER — Ambulatory Visit: Payer: Self-pay | Admitting: Internal Medicine

## 2018-04-29 ENCOUNTER — Emergency Department (HOSPITAL_COMMUNITY): Payer: Medicare Other

## 2018-04-29 DIAGNOSIS — I10 Essential (primary) hypertension: Secondary | ICD-10-CM | POA: Insufficient documentation

## 2018-04-29 DIAGNOSIS — R112 Nausea with vomiting, unspecified: Secondary | ICD-10-CM

## 2018-04-29 DIAGNOSIS — F329 Major depressive disorder, single episode, unspecified: Secondary | ICD-10-CM | POA: Diagnosis not present

## 2018-04-29 DIAGNOSIS — I480 Paroxysmal atrial fibrillation: Secondary | ICD-10-CM | POA: Insufficient documentation

## 2018-04-29 DIAGNOSIS — R079 Chest pain, unspecified: Secondary | ICD-10-CM | POA: Diagnosis not present

## 2018-04-29 DIAGNOSIS — J449 Chronic obstructive pulmonary disease, unspecified: Secondary | ICD-10-CM | POA: Diagnosis not present

## 2018-04-29 DIAGNOSIS — R1110 Vomiting, unspecified: Secondary | ICD-10-CM | POA: Diagnosis present

## 2018-04-29 DIAGNOSIS — F419 Anxiety disorder, unspecified: Secondary | ICD-10-CM | POA: Insufficient documentation

## 2018-04-29 DIAGNOSIS — Z79899 Other long term (current) drug therapy: Secondary | ICD-10-CM | POA: Diagnosis not present

## 2018-04-29 DIAGNOSIS — R0602 Shortness of breath: Secondary | ICD-10-CM | POA: Diagnosis not present

## 2018-04-29 DIAGNOSIS — Z87891 Personal history of nicotine dependence: Secondary | ICD-10-CM | POA: Insufficient documentation

## 2018-04-29 LAB — BASIC METABOLIC PANEL
Anion gap: 9 (ref 5–15)
BUN: 29 mg/dL — ABNORMAL HIGH (ref 8–23)
CO2: 23 mmol/L (ref 22–32)
Calcium: 9.3 mg/dL (ref 8.9–10.3)
Chloride: 112 mmol/L — ABNORMAL HIGH (ref 98–111)
Creatinine, Ser: 1.42 mg/dL — ABNORMAL HIGH (ref 0.44–1.00)
GFR calc Af Amer: 40 mL/min — ABNORMAL LOW (ref >60)
GFR calc non Af Amer: 35 mL/min — ABNORMAL LOW (ref >60)
Glucose, Bld: 93 mg/dL (ref 70–99)
Potassium: 4.8 mmol/L (ref 3.5–5.1)
Sodium: 144 mmol/L (ref 135–145)

## 2018-04-29 LAB — CBC
HCT: 41.4 % (ref 36.0–46.0)
Hemoglobin: 12.8 g/dL (ref 12.0–15.0)
MCH: 29.6 pg (ref 26.0–34.0)
MCHC: 30.9 g/dL (ref 30.0–36.0)
MCV: 95.8 fL (ref 80.0–100.0)
Platelets: 391 10*3/uL (ref 150–400)
RBC: 4.32 MIL/uL (ref 3.87–5.11)
RDW: 16 % — ABNORMAL HIGH (ref 11.5–15.5)
WBC: 11.5 10*3/uL — ABNORMAL HIGH (ref 4.0–10.5)
nRBC: 0 % (ref 0.0–0.2)

## 2018-04-29 LAB — I-STAT TROPONIN, ED: Troponin i, poc: 0.01 ng/mL (ref 0.00–0.08)

## 2018-04-29 MED ORDER — METOPROLOL TARTRATE 25 MG PO TABS
25.0000 mg | ORAL_TABLET | Freq: Once | ORAL | Status: AC
  Administered 2018-04-29: 25 mg via ORAL
  Filled 2018-04-29: qty 1

## 2018-04-29 MED ORDER — ONDANSETRON HCL 4 MG/2ML IJ SOLN
4.0000 mg | Freq: Once | INTRAMUSCULAR | Status: DC
  Filled 2018-04-29: qty 2

## 2018-04-29 MED ORDER — SODIUM CHLORIDE 0.9% FLUSH: 3.0000 mL | Freq: Once | INTRAVENOUS | Status: DC

## 2018-04-29 NOTE — Telephone Encounter (Signed)
Pt walked in demanding to be seen complaining of SOB,fever "in eyes" ,dizziness,pain when takes deep breath and vomiting Pt states to staff is afraid may have coronavirus Encouraged pt to follow up at urgent care ,[facility], or ED for evaluation as this does not sound heart related `.Will forward to Dr Stanford Breed for review /cy

## 2018-04-29 NOTE — ED Notes (Signed)
RN aware of pts elevated BP

## 2018-04-29 NOTE — Telephone Encounter (Signed)
Being seen in ER at time of my review [name]

## 2018-04-29 NOTE — ED Triage Notes (Addendum)
Pt states that she woke up this morning and her heart was fluttering. Pt states she is also having a hard time catching her breath. Pt has also had emesis.

## 2018-04-29 NOTE — ED Provider Notes (Signed)
[facility] DEPT Provider Note   CSN: [id number] Arrival date & time: 04/29/18  1229    History   Chief Complaint Chief Complaint  Patient presents with  . Shortness of Breath  . Chest Pain    HPI [name] is a 83 y.o. female.     83 yo female with history of a-fib presents to the ER for vomiting, shortness of breath, chest tightness and palpitations. Patient reports symptoms x 4 days, started 4 days ago with vomiting for unknown reason, states she was told not to take her medicine if she did not eat so she has not taken her medicine (including her Lopressor and flecainide) as eating anything results in vomiting. Denies abdominal pain, reports chronic constipations, denies fevers, chills, no other complaints or concerns.      Past Medical History:  Diagnosis Date  . Anxiety   . Arthritis   . Atrial fibrillation ([place])   . B12 deficiency   . COPD (chronic obstructive pulmonary disease) ([place])   . Depression   . GERD (gastroesophageal reflux disease)   . Hypertension   . Osteoarthrosis, hand    both hands  . Peptic ulcer, unspecified site, unspecified as acute or chronic, without mention of hemorrhage, perforation, or obstruction   . Pneumonia   . PONV (postoperative nausea and vomiting)   . Vitamin D deficiency disease     Patient Active Problem List   Diagnosis Date Noted  . Esophageal dysmotility 01/11/2018  . Weight gain 01/01/2018  . Abdominal bloating 01/01/2018  . Hyperkalemia 06/04/2017  . Grief at loss of child 04/02/2017  . Adenopathy, cervical 11/29/2016  . Dysuria 10/31/2016  . Vertigo 10/31/2016  . Ureteral calculus 09/11/2016  . CAP (community acquired pneumonia) 08/30/2016  . Fatigue 08/24/2016  . Sensation of pressure in bladder area 08/24/2016  . Kidney stone 08/13/2016  . Flank pain 08/10/2016  . Right upper quadrant abdominal tenderness without rebound tenderness 08/10/2016  . Leukocytosis 08/10/2016  .  Acute abdomen 08/10/2016  . Intractable nausea and vomiting 08/10/2016  . ARF (acute renal failure) ([place]) 08/10/2016  . Urolithiasis 08/10/2016  . Abdominal pain 08/10/2016  . Anemia 05/19/2016  . Generalized anxiety disorder 01/14/2016  . Dysphagia 04/16/2015  . Dry skin dermatitis 12/28/2014  . Atrophic vaginitis 05/26/2014  . Varicose veins of both lower extremities 12/12/2013  . Restless leg syndrome 03/17/2013  . Impacted cerumen of left ear 03/17/2013  . Osteoarthritis, hand 09/05/2011  . Epistaxis, recurrent 08/17/2011  . Well adult exam 01/31/2011  . Low back pain 01/31/2011  . Palpitations 05/24/2010  . Cerumen impaction 02/23/2010  . PHARYNGITIS, ACUTE 02/23/2010  . SINUSITIS, ACUTE 01/07/2010  . SOB (shortness of breath) 09/23/2009  . BRONCHITIS, ACUTE 08/03/2009  . Osteoarthritis 07/06/2009  . TOBACCO USE, QUIT 03/08/2009  . ALLERGIC RHINITIS 11/06/2008  . NAUSEA ALONE 08/19/2008  . DIARRHEA 08/19/2008  . Essential hypertension 07/15/2008  . EMPHYSEMA 07/15/2008  . PEPTIC ULCER DISEASE 07/15/2008  . ARTHRITIS 07/15/2008  . HEADACHE 05/25/2008  . SYNCOPE 03/23/2008  . WEIGHT LOSS, ABNORMAL 03/23/2008  . HEARING IMPAIRMENT 12/23/2007  . B12 deficiency 10/14/2007  . Malaise 10/14/2007  . INSOMNIA, PERSISTENT 07/31/2007  . Anxiety state 03/01/2007  . DEPRESSION 03/01/2007  . Atrial fibrillation ([place]) 02/26/2007  . Vitamin D deficiency 02/19/2007  . GERD 02/19/2007  . COPD with chronic bronchitis ([place]) 08/07/2006    Past Surgical History:  Procedure Laterality Date  . BLADDER SURGERY     Bladder  tack and intestines  . CYSTOCELE REPAIR    . CYSTOSCOPY WITH RETROGRADE PYELOGRAM, URETEROSCOPY AND STENT PLACEMENT Right 09/11/2016   Procedure: CYSTOSCOPY WITH RETROGRADE PYELOGRAM, URETEROSCOPY AND STENT REPLACEMENT;  Surgeon: Cleon Gustin, MD;  Location: [facility];  Service: Urology;  Laterality: Right;  . CYSTOSCOPY WITH STENT PLACEMENT Right 08/11/2016    Procedure: CYSTOSCOPY, URETERSCOPY,  RIGHT RETROGRADE WITH RIGHT STENT PLACEMENT;  Surgeon: Festus Aloe, MD;  Location: [facility];  Service: Urology;  Laterality: Right;  . SPLENECTOMY       OB History   No obstetric history on file.      Home Medications    Prior to Admission medications   Medication Sig Start Date End Date Taking? Authorizing Provider  amLODipine (NORVASC) 5 MG tablet Take 1 tablet (5 mg total) by mouth daily. 06/25/17 09/23/17  Lelon Perla, MD  Aspirin-Salicylamide-Caffeine (BC HEADACHE POWDER PO) Take 1 packet by mouth daily as needed (headaches).    [provider]  Biotin 1000 MCG tablet Take 1,000 mcg by mouth daily.    [provider]  Cholecalciferol ([employer] VITAMIN D3) 1000 UNITS capsule Take 1,000 Units by mouth daily.      [provider]  clonazePAM (KLONOPIN) 0.5 MG tablet TAKE 1 TABLET BY MOUTH AT BEDTIME AS NEEDED FOR LEG CRAMPS 12/14/17   Plotnikov, Evie Lacks, MD  cyanocobalamin 1000 MCG tablet Take 1,000 mcg by mouth daily.    [provider]  flecainide (TAMBOCOR) 50 MG tablet TAKE 1 TABLET BY MOUTH TWICE DAILY 07/03/17   Lelon Perla, MD  metoprolol tartrate (LOPRESSOR) 25 MG tablet Take 0.5 tablets (12.5 mg total) by mouth 2 (two) times daily. 09/13/17   Plotnikov, Evie Lacks, MD  pantoprazole (PROTONIX) 40 MG tablet Take 1 tablet (40 mg total) by mouth daily. 01/01/18   Plotnikov, Evie Lacks, MD    Family History Family History  Problem Relation Age of Onset  . Heart disease Father   . Arthritis Other        Family history of  . Coronary artery disease Other        Family history of  . Hypertension Other        Family history of  . Diabetes Daughter        24 died    Social History Social History   Tobacco Use  . Smoking status: Former Smoker    Packs/day: 1.00    Types: Cigarettes    Last attempt to quit: 2010    Years since quitting: 10.1  . Smokeless tobacco: Never Used  . Tobacco  comment: pt unsure how many years she smoked  Substance Use Topics  . Alcohol use: No  . Drug use: No     Allergies   Amoxicillin; Codeine; Levaquin [levofloxacin in d5w]; and Other   Review of Systems Review of Systems  Constitutional: Negative for chills and fever.  Respiratory: Positive for chest tightness and shortness of breath.   Cardiovascular: Positive for palpitations. Negative for leg swelling.  Gastrointestinal: Positive for constipation, nausea and vomiting. Negative for abdominal pain and diarrhea.  Skin: Negative for rash and wound.  Allergic/Immunologic: Negative for immunocompromised state.  Neurological: Negative for weakness.  Psychiatric/Behavioral: Negative for confusion.  All other systems reviewed and are negative.    Physical Exam Updated Vital Signs BP (!) 180/61   Pulse 69   Temp 97.9 F (36.6 C) (Oral)   Resp 13   Ht 5\' 4"  (1.626 m)  Wt 64.4 kg   SpO2 94%   BMI 24.37 kg/m   Physical Exam Vitals signs and nursing note reviewed.  Constitutional:      General: She is not in acute distress.    Appearance: She is well-developed. She is not diaphoretic.  HENT:     Head: Normocephalic and atraumatic.  Cardiovascular:     Rate and Rhythm: Normal rate. Rhythm irregular.  Pulmonary:     Effort: Pulmonary effort is normal.     Breath sounds: No decreased breath sounds.  Chest:     Chest wall: No tenderness.  Abdominal:     Palpations: Abdomen is soft.     Tenderness: There is no abdominal tenderness.  Musculoskeletal:     Right lower leg: She exhibits no tenderness. No edema.     Left lower leg: She exhibits no tenderness. No edema.  Neurological:     Mental Status: She is alert and oriented to person, place, and time.  Psychiatric:        Behavior: Behavior normal.      ED Treatments / Results  Labs (all labs ordered are listed, but only abnormal results are displayed) Labs Reviewed  BASIC METABOLIC PANEL - Abnormal; Notable for  the following components:      Result Value   Chloride 112 (*)    BUN 29 (*)    Creatinine, Ser 1.42 (*)    GFR calc non Af Amer 35 (*)    GFR calc Af Amer 40 (*)    All other components within normal limits  CBC - Abnormal; Notable for the following components:   WBC 11.5 (*)    RDW 16.0 (*)    All other components within normal limits  I-STAT TROPONIN, ED    EKG EKG Interpretation  Date/Time:  Monday April 29 2018 12:47:00 EST Ventricular Rate:  68 PR Interval:    QRS Duration: 94 QT Interval:  418 QTC Calculation: 445 R Axis:   89 Text Interpretation:  Sinus arrhythmia vs afib with controlled rate Atrial premature complexes Borderline right axis deviation Anteroseptal infarct, old similar to prior 3/15 Reconfirmed by Aletta Edouard [phone]) on 04/29/2018 2:11:21 PM   Radiology Dg Chest 2 View  Result Date: 04/29/2018 CLINICAL DATA:  Chest pain. EXAM: CHEST - 2 VIEW COMPARISON:  01/01/2017 FINDINGS: The heart size and mediastinal contours are within normal limits. Aortic atherosclerosis. Both lungs are clear. The visualized skeletal structures are unremarkable. IMPRESSION: 1. No acute cardiopulmonary abnormalities. 2.  Aortic Atherosclerosis (ICD10-I70.0). Electronically Signed   By: Kerby Moors M.D.   On: 04/29/2018 14:12    Procedures Procedures (including critical care time)  Medications Ordered in ED Medications  sodium chloride flush (NS) 0.9 % injection 3 mL (has no administration in time range)  ondansetron (ZOFRAN) injection 4 mg (4 mg Intravenous Not Given 04/29/18 1445)  metoprolol tartrate (LOPRESSOR) tablet 25 mg (25 mg Oral Given 04/29/18 1442)     Initial Impression / Assessment and Plan / ED Course  I have reviewed the triage vital signs and the nursing notes.  Pertinent labs & imaging results that were available during my care of the patient were reviewed by me and considered in my medical decision making (see chart for details).  Clinical Course  as of Apr 29 1533  Mon Apr 29, 3638  267 83 year old female brought in for evaluation of feeling lightheaded.  She has not felt well for a few days and has not been taking her heart medication.  Here she is in a rate controlled A. fib although there are definitely some sinus complexes and there.  Her lab work is at baseline.  She is feeling better here after taking some medication.  We will have her closely follow-up with her primary care doctor.   [MB]  1533 Symptoms have resolved with zofran and metoprolol. Patient is tolerating POs (soup) and would like to be dc home. Discussed afib, should consider anticoagulant, patient did not like coumadin and has been hesitant to take an anticoagulant but will have this discussion with her doctor at follow up.    [LM]    Clinical Course User Index [LM] Tacy Learn, PA-C [MB] Hayden Rasmussen, MD   Final Clinical Impressions(s) / ED Diagnoses   Final diagnoses:  Non-intractable vomiting with nausea, unspecified vomiting type  Paroxysmal atrial fibrillation [facility])    ED Discharge Orders    None       Roque Lias 04/29/18 1536    Hayden Rasmussen, MD 04/30/18 1050

## 2018-04-29 NOTE — ED Notes (Signed)
Family at bedside. 

## 2018-04-29 NOTE — Telephone Encounter (Signed)
Attempted to contact patient; left message on voicemail [phone].

## 2018-04-29 NOTE — Telephone Encounter (Signed)
Contacted pt regarding symptoms; she states that she has been feeling like th is for about 4 days: feels like heart is skipping a beat, dizzy/blurred vision, vomiting and unable to keep anything down, and chest tightness; she also says that it feels like her throat is closing (says she has seen throat MD about this); she was seen in the office; recommendations made per nurse triage protocol; pt verbalizes understanding and will have her sister take her to the ED; pt normally sees Dr Alain Marion, [facility]; will route to office for notification of disposition.    Reason for Disposition . Dizziness, lightheadedness, or weakness  Answer Assessment - Initial Assessment Questions 1. DESCRIPTION: "Please describe your heart rate or heart beat that you are having" (e.g., fast/slow, regular/irregular, skipped or extra beats, "palpitations")     Heart skipping a beat/flutter 2. ONSET: "When did it start?" (Minutes, hours or days)      04/25/2018 3. DURATION: "How long does it last" (e.g., seconds, minutes, hours)     days 4. PATTERN "Does it come and go, or has it been constant since it started?"  "Does it get worse with exertion?"   "Are you feeling it now?"     constant 5. TAP: "Using your hand, can you tap out what you are feeling on a chair or table in front of you, so that I can hear?" (Note: not all patients can do this)       Pt can not complete this task 6. HEART RATE: "Can you tell me your heart rate?" "How many beats in 15 seconds?"  (Note: not all patients can do this)       Pt can not complete this task 7. RECURRENT SYMPTOM: "Have you ever had this before?" If so, ask: "When was the last time?" and "What happened that time?"      Yes history afib 8. CAUSE: "What do you think is causing the palpitations?"     Heart not right or COPD 9. CARDIAC HISTORY: "Do you have any history of heart disease?" (e.g., heart attack, angina, bypass surgery, angioplasty, arrhythmia)      Afib, hypertension 10.  OTHER SYMPTOMS: "Do you have any other symptoms?" (e.g., dizziness, chest pain, sweating, difficulty breathing)       Dizziness, chest tightness 11. PREGNANCY: "Is there any chance you are pregnant?" "When was your last menstrual period?"       no  Protocols used: HEART RATE AND HEARTBEAT QUESTIONS-A-AH

## 2018-04-29 NOTE — Discharge Instructions (Addendum)
Continue with your medications at home. Follow up with your doctor this week, return to ER for worsening or concerning symptoms.

## 2018-04-29 NOTE — ED Notes (Signed)
Bed: LJ44 Expected date:  Expected time:  Means of arrival:  Comments: EMS

## 2018-04-30 NOTE — Telephone Encounter (Signed)
Pt went to ED

## 2018-05-06 ENCOUNTER — Ambulatory Visit (INDEPENDENT_AMBULATORY_CARE_PROVIDER_SITE_OTHER): Payer: Medicare Other | Admitting: Internal Medicine

## 2018-05-06 ENCOUNTER — Encounter: Payer: Self-pay | Admitting: Internal Medicine

## 2018-05-06 VITALS — BP 140/76 | HR 61 | Temp 98.0°F | Ht 64.0 in | Wt 140.0 lb

## 2018-05-06 DIAGNOSIS — F411 Generalized anxiety disorder: Secondary | ICD-10-CM | POA: Diagnosis not present

## 2018-05-06 DIAGNOSIS — I2510 Atherosclerotic heart disease of native coronary artery without angina pectoris: Secondary | ICD-10-CM | POA: Insufficient documentation

## 2018-05-06 DIAGNOSIS — F4321 Adjustment disorder with depressed mood: Secondary | ICD-10-CM

## 2018-05-06 DIAGNOSIS — I480 Paroxysmal atrial fibrillation: Secondary | ICD-10-CM | POA: Diagnosis not present

## 2018-05-06 DIAGNOSIS — E538 Deficiency of other specified B group vitamins: Secondary | ICD-10-CM

## 2018-05-06 DIAGNOSIS — I2583 Coronary atherosclerosis due to lipid rich plaque: Secondary | ICD-10-CM

## 2018-05-06 DIAGNOSIS — J449 Chronic obstructive pulmonary disease, unspecified: Secondary | ICD-10-CM

## 2018-05-06 DIAGNOSIS — Z634 Disappearance and death of family member: Secondary | ICD-10-CM

## 2018-05-06 DIAGNOSIS — I10 Essential (primary) hypertension: Secondary | ICD-10-CM | POA: Diagnosis not present

## 2018-05-06 NOTE — Assessment & Plan Note (Signed)
Dr Stanford Breed Chest CT 2018 -- Cardiovascular: Mild cardiomegaly. No significant pericardial fluid/thickening. Left anterior descending, left circumflex and right coronary atherosclerosis. Atherosclerotic nonaneurysmal thoracic aorta.   Declined statins On Aspirin Try red rice yeast

## 2018-05-06 NOTE — Assessment & Plan Note (Addendum)
Flecainide, ASA, Toprol Dr Stanford Breed  Pt refused anticoagulation

## 2018-05-06 NOTE — Patient Instructions (Signed)
Keep ROV

## 2018-05-06 NOTE — Assessment & Plan Note (Signed)
Discussed grief

## 2018-05-06 NOTE — Assessment & Plan Note (Signed)
Start walking 

## 2018-05-06 NOTE — Assessment & Plan Note (Signed)
On B12 

## 2018-05-06 NOTE — Assessment & Plan Note (Addendum)
Lorazepam was d/c'd Clonazepam prn

## 2018-05-06 NOTE — Assessment & Plan Note (Signed)
Lopressor

## 2018-05-06 NOTE — Progress Notes (Signed)
Subjective:  Patient ID: [name], female    DOB: [birth year]  Age: 83 y.o. MRN: [id number]  CC: No chief complaint on file.   HPI [name] presents for palpitations, COPD, anxiety f/u. S/p ER visit on 2/24  Outpatient Medications Prior to Visit  Medication Sig Dispense Refill  . Aspirin-Salicylamide-Caffeine (BC HEADACHE POWDER PO) Take 1 packet by mouth daily as needed (headaches).    . Biotin 1000 MCG tablet Take 1,000 mcg by mouth daily.    . Cholecalciferol ([employer]S VITAMIN D3) 1000 UNITS capsule Take 1,000 Units by mouth daily.      . clonazePAM (KLONOPIN) 0.5 MG tablet TAKE 1 TABLET BY MOUTH AT BEDTIME AS NEEDED FOR LEG CRAMPS 30 tablet 3  . cyanocobalamin 1000 MCG tablet Take 1,000 mcg by mouth daily.    . flecainide (TAMBOCOR) 50 MG tablet TAKE 1 TABLET BY MOUTH TWICE DAILY (Patient taking differently: Take 50 mg by mouth 2 (two) times daily. ) 180 tablet 3  . metoprolol tartrate (LOPRESSOR) 25 MG tablet Take 0.5 tablets (12.5 mg total) by mouth 2 (two) times daily. 90 tablet 1  . pantoprazole (PROTONIX) 40 MG tablet Take 1 tablet (40 mg total) by mouth daily. 30 tablet 11  . amLODipine (NORVASC) 5 MG tablet Take 1 tablet (5 mg total) by mouth daily. 90 tablet 3   No facility-administered medications prior to visit.     ROS: Review of Systems  Constitutional: Negative for activity change, appetite change, chills, diaphoresis, fatigue, fever and unexpected weight change.  HENT: Negative for congestion, dental problem, ear pain, hearing loss, mouth sores, postnasal drip, sinus pressure, sneezing, sore throat and voice change.   Eyes: Negative for pain and visual disturbance.  Respiratory: Negative for cough, chest tightness, wheezing and stridor.   Cardiovascular: Negative for chest pain, palpitations and leg swelling.  Gastrointestinal: Negative for abdominal distention, abdominal pain, blood in stool, nausea, rectal pain and vomiting.  Genitourinary: Negative for  decreased urine volume, difficulty urinating, dysuria, frequency, hematuria, menstrual problem, vaginal bleeding, vaginal discharge and vaginal pain.  Musculoskeletal: Positive for arthralgias. Negative for back pain, gait problem, joint swelling and neck pain.  Skin: Negative for color change, rash and wound.  Neurological: Negative for dizziness, tremors, syncope, speech difficulty, weakness and light-headedness.  Hematological: Negative for adenopathy.  Psychiatric/Behavioral: Negative for behavioral problems, confusion, decreased concentration, dysphoric mood, hallucinations, sleep disturbance and suicidal ideas. The patient is not nervous/anxious and is not hyperactive.     Objective:  BP 140/76 (BP Location: Left Arm, Patient Position: Sitting, Cuff Size: Normal)   Pulse 61   Temp 98 F (36.7 C) (Oral)   Ht 5\' 4"  (1.626 m)   Wt 140 lb (63.5 kg)   SpO2 95%   BMI 24.03 kg/m   BP Readings from Last 3 Encounters:  05/06/18 140/76  04/29/18 (!) 180/61  04/09/18 138/82    Wt Readings from Last 3 Encounters:  05/06/18 140 lb (63.5 kg)  04/29/18 142 lb (64.4 kg)  04/09/18 142 lb (64.4 kg)    Physical Exam Constitutional:      General: She is not in acute distress.    Appearance: She is well-developed.  HENT:     Head: Normocephalic.     Right Ear: External ear normal.     Left Ear: External ear normal.     Nose: Nose normal.  Eyes:     General:        Right eye: No discharge.  Left eye: No discharge.     Conjunctiva/sclera: Conjunctivae normal.     Pupils: Pupils are equal, round, and reactive to light.  Neck:     Musculoskeletal: Normal range of motion and neck supple.     Thyroid: No thyromegaly.     Vascular: No JVD.     Trachea: No tracheal deviation.  Cardiovascular:     Rate and Rhythm: Normal rate. Rhythm irregular.     Heart sounds: Normal heart sounds.  Pulmonary:     Effort: No respiratory distress.     Breath sounds: No stridor. No wheezing.    Abdominal:     General: Bowel sounds are normal. There is no distension.     Palpations: Abdomen is soft. There is no mass.     Tenderness: There is no abdominal tenderness. There is no guarding or rebound.  Musculoskeletal:        General: No tenderness.  Lymphadenopathy:     Cervical: No cervical adenopathy.  Skin:    Findings: No erythema or rash.  Neurological:     Cranial Nerves: No cranial nerve deficit.     Motor: No abnormal muscle tone.     Coordination: Coordination normal.     Deep Tendon Reflexes: Reflexes normal.  Psychiatric:        Behavior: Behavior normal.        Thought Content: Thought content normal.        Judgment: Judgment normal.     Lab Results  Component Value Date   WBC 11.5 (H) 04/29/2018   HGB 12.8 04/29/2018   HCT 41.4 04/29/2018   PLT 391 04/29/2018   GLUCOSE 93 04/29/2018   CHOL 199 03/21/2013   TRIG 107.0 03/21/2013   HDL 57.50 03/21/2013   LDLDIRECT 144.3 01/31/2011   LDLCALC 120 (H) 03/21/2013   ALT 9 01/01/2018   AST 17 01/01/2018   NA 144 04/29/2018   K 4.8 04/29/2018   CL 112 (H) 04/29/2018   CREATININE 1.42 (H) 04/29/2018   BUN 29 (H) 04/29/2018   CO2 23 04/29/2018   TSH 1.91 01/01/2018   INR 0.9 08/13/2008    Dg Chest 2 View  Result Date: 04/29/2018 CLINICAL DATA:  Chest pain. EXAM: CHEST - 2 VIEW COMPARISON:  01/01/2017 FINDINGS: The heart size and mediastinal contours are within normal limits. Aortic atherosclerosis. Both lungs are clear. The visualized skeletal structures are unremarkable. IMPRESSION: 1. No acute cardiopulmonary abnormalities. 2.  Aortic Atherosclerosis (ICD10-I70.0). Electronically Signed   By: Kerby Moors M.D.   On: 04/29/2018 14:12    Assessment & Plan:   There are no diagnoses linked to this encounter.   No orders of the defined types were placed in this encounter.    Follow-up: No follow-ups on file.  Walker Kehr, MD

## 2018-05-19 DIAGNOSIS — J449 Chronic obstructive pulmonary disease, unspecified: Secondary | ICD-10-CM | POA: Diagnosis not present

## 2018-05-19 DIAGNOSIS — J438 Other emphysema: Secondary | ICD-10-CM | POA: Diagnosis not present

## 2018-06-04 ENCOUNTER — Other Ambulatory Visit: Payer: Self-pay | Admitting: Cardiology

## 2018-06-04 ENCOUNTER — Other Ambulatory Visit: Payer: Self-pay | Admitting: Internal Medicine

## 2018-06-04 DIAGNOSIS — I480 Paroxysmal atrial fibrillation: Secondary | ICD-10-CM

## 2018-06-05 NOTE — Telephone Encounter (Signed)
Pt left message on voicemail requesting a medication refill but did not specify what medication.

## 2018-06-05 NOTE — Telephone Encounter (Signed)
Attempted to contact pt to verify requested medication; the pt states that she needs metoprolol, and she contacted [facility]; see electronic request per pharmacy.

## 2018-06-19 DIAGNOSIS — J438 Other emphysema: Secondary | ICD-10-CM | POA: Diagnosis not present

## 2018-06-19 DIAGNOSIS — J449 Chronic obstructive pulmonary disease, unspecified: Secondary | ICD-10-CM | POA: Diagnosis not present

## 2018-07-01 ENCOUNTER — Other Ambulatory Visit: Payer: Self-pay

## 2018-07-01 ENCOUNTER — Other Ambulatory Visit: Payer: Self-pay | Admitting: Cardiology

## 2018-07-01 MED ORDER — FLECAINIDE ACETATE 50 MG PO TABS: 50.0000 mg | ORAL_TABLET | Freq: Two times a day (BID) | ORAL | 0 refills | Status: DC

## 2018-07-08 ENCOUNTER — Ambulatory Visit: Payer: Self-pay | Admitting: Internal Medicine

## 2018-07-12 ENCOUNTER — Telehealth: Payer: Self-pay

## 2018-07-12 MED ORDER — FLUCONAZOLE 150 MG PO TABS: 150.0000 mg | ORAL_TABLET | Freq: Once | ORAL | 0 refills | Status: AC

## 2018-07-12 NOTE — Telephone Encounter (Signed)
Copied from [place] [id number]. Topic: Quick Communication - See Telephone Encounter >> Jul 11, 2018  4:23 PM Loma [place] wrote: CRM for notification. See Telephone encounter for: 07/11/18. Pt says that she has another yeast infection and because of COVID she was wanting Dr Mamie Nick to call her in something as this is a very frequent issue. Please have nurse fu at [phone]

## 2018-07-12 NOTE — Telephone Encounter (Signed)
Called and left message for patient.

## 2018-07-12 NOTE — Telephone Encounter (Signed)
Sent in diflucan to take once for the yeast infection. If no resolution within 3 days should try to do phone or virtual.

## 2018-07-12 NOTE — Telephone Encounter (Signed)
Pt aware.

## 2018-07-12 NOTE — Addendum Note (Signed)
Addended by: Pricilla Holm A on: 07/12/2018 10:30 AM   Modules accepted: Orders

## 2018-07-16 ENCOUNTER — Telehealth: Payer: Medicare Other | Admitting: Cardiology

## 2018-07-19 DIAGNOSIS — J438 Other emphysema: Secondary | ICD-10-CM | POA: Diagnosis not present

## 2018-07-19 DIAGNOSIS — J449 Chronic obstructive pulmonary disease, unspecified: Secondary | ICD-10-CM | POA: Diagnosis not present

## 2018-07-25 ENCOUNTER — Other Ambulatory Visit: Payer: Self-pay | Admitting: Internal Medicine

## 2018-07-25 NOTE — Telephone Encounter (Signed)
Copied from [place] [id number]. Topic: Quick Communication - Rx Refill/Question >> Jul 25, 2018  1:04 PM Nils Flack wrote: Medication: clonazePAM (KLONOPIN) 0.5 MG tablet  Has the patient contacted their pharmacy? Yes.   (Agent: If no, request that the patient contact the pharmacy for the refill.) (Agent: If yes, when and what did the pharmacy advise?)  Preferred Pharmacy (with phone number or street name): [facility]   Agent: Please be advised that RX refills may take up to 3 business days. We ask that you follow-up with your pharmacy.

## 2018-07-31 NOTE — Telephone Encounter (Signed)
Patient is checking on status of medication. Patient called in medication 5/21.

## 2018-07-31 NOTE — Telephone Encounter (Signed)
Please advise 

## 2018-08-01 MED ORDER — CLONAZEPAM 0.5 MG PO TABS: ORAL_TABLET | ORAL | 3 refills | Status: DC

## 2018-08-01 NOTE — Telephone Encounter (Signed)
Please advise about refill

## 2018-08-19 DIAGNOSIS — J449 Chronic obstructive pulmonary disease, unspecified: Secondary | ICD-10-CM | POA: Diagnosis not present

## 2018-08-19 DIAGNOSIS — J438 Other emphysema: Secondary | ICD-10-CM | POA: Diagnosis not present

## 2018-08-26 DIAGNOSIS — M549 Dorsalgia, unspecified: Secondary | ICD-10-CM | POA: Diagnosis not present

## 2018-08-26 DIAGNOSIS — N952 Postmenopausal atrophic vaginitis: Secondary | ICD-10-CM | POA: Diagnosis not present

## 2018-09-02 DIAGNOSIS — H353132 Nonexudative age-related macular degeneration, bilateral, intermediate dry stage: Secondary | ICD-10-CM | POA: Diagnosis not present

## 2018-09-02 DIAGNOSIS — H532 Diplopia: Secondary | ICD-10-CM | POA: Diagnosis not present

## 2018-09-02 DIAGNOSIS — H16223 Keratoconjunctivitis sicca, not specified as Sjogren's, bilateral: Secondary | ICD-10-CM | POA: Diagnosis not present

## 2018-09-02 DIAGNOSIS — H26491 Other secondary cataract, right eye: Secondary | ICD-10-CM | POA: Diagnosis not present

## 2018-09-02 DIAGNOSIS — Z961 Presence of intraocular lens: Secondary | ICD-10-CM | POA: Diagnosis not present

## 2018-09-04 NOTE — Progress Notes (Signed)
HPI FU paroxysmal atrial fibrillation. We did perform a Myoview on 01/21/08 that showed normal perfusion and an ejection fraction of 73%.  A previous monitor revealed paroxysmal atrial fibrillation/flutter with a rapid ventricular response. Note she refuses Coumadin. A CardioNet was performed in Feb 2012 secondary to palpitations and revealed sinus rhythm with PACs and rare PVCs.  Echocardiogram July 2019 showed normal LV function, moderate left ventricular hypertrophy and mild aortic insufficiency. Since I last saw her,she has chronic dyspnea that she attributes to COPD.  There is no chest pain, palpitations or syncope.  Has some dizziness that improves with eating.  Current Outpatient Medications  Medication Sig Dispense Refill  . amLODipine (NORVASC) 5 MG tablet TAKE 1 TABLET(5 MG) BY MOUTH DAILY 90 tablet 1  . Aspirin-Salicylamide-Caffeine (BC HEADACHE POWDER PO) Take 1 packet by mouth daily as needed (headaches).    . Biotin 1000 MCG tablet Take 1,000 mcg by mouth daily.    . Cholecalciferol (CVS VITAMIN D3) 1000 UNITS capsule Take 1,000 Units by mouth daily.      . clonazePAM (KLONOPIN) 0.5 MG tablet TAKE 1 TABLET BY MOUTH AT BEDTIME AS NEEDED FOR LEG CRAMPS 30 tablet 3  . cyanocobalamin 1000 MCG tablet Take 1,000 mcg by mouth daily.    . flecainide (TAMBOCOR) 50 MG tablet Take 1 tablet (50 mg total) by mouth 2 (two) times daily. 180 tablet 0  . metoprolol tartrate (LOPRESSOR) 25 MG tablet TAKE 1/2 TABLET(12.5 MG) BY MOUTH TWICE DAILY 90 tablet 1  . pantoprazole (PROTONIX) 40 MG tablet Take 1 tablet (40 mg total) by mouth daily. 30 tablet 11   No current facility-administered medications for this visit.      Past Medical History:  Diagnosis Date  . Anxiety   . Arthritis   . Atrial fibrillation ([place])   . B12 deficiency   . COPD (chronic obstructive pulmonary disease) ([place])   . Depression   . GERD (gastroesophageal reflux disease)   . Hypertension   . Osteoarthrosis, hand     both hands  . Peptic ulcer, unspecified site, unspecified as acute or chronic, without mention of hemorrhage, perforation, or obstruction   . Pneumonia   . PONV (postoperative nausea and vomiting)   . Vitamin D deficiency disease     Past Surgical History:  Procedure Laterality Date  . BLADDER SURGERY     Bladder tack and intestines  . CYSTOCELE REPAIR    . CYSTOSCOPY WITH RETROGRADE PYELOGRAM, URETEROSCOPY AND STENT PLACEMENT Right 09/11/2016   Procedure: CYSTOSCOPY WITH RETROGRADE PYELOGRAM, URETEROSCOPY AND STENT REPLACEMENT;  Surgeon: Cleon Gustin, MD;  Location: [facility];  Service: Urology;  Laterality: Right;  . CYSTOSCOPY WITH STENT PLACEMENT Right 08/11/2016   Procedure: CYSTOSCOPY, URETERSCOPY,  RIGHT RETROGRADE WITH RIGHT STENT PLACEMENT;  Surgeon: Festus Aloe, MD;  Location: [facility];  Service: Urology;  Laterality: Right;  . SPLENECTOMY      Social History   Socioeconomic History  . Marital status: Divorced    Spouse name: Not on file  . Number of children: 2  . Years of education: Not on file  . Highest education level: Not on file  Occupational History  . Occupation: Airline pilot: [employer]  . Financial resource strain: Not on file  . Food insecurity    Worry: Not on file    Inability: Not on file  . Transportation needs    Medical: Not on file    Non-medical: Not  on file  Tobacco Use  . Smoking status: Former Smoker    Packs/day: 1.00    Types: Cigarettes    Quit date: 2008/06/04    Years since quitting: 10.5  . Smokeless tobacco: Never Used  . Tobacco comment: pt unsure how many years she smoked  Substance and Sexual Activity  . Alcohol use: No  . Drug use: No  . Sexual activity: Not Currently  Lifestyle  . Physical activity    Days per week: Not on file    Minutes per session: Not on file  . Stress: Not on file  Relationships  . Social Herbalist on phone: Not on file    Gets together: Not on file    Attends  religious service: Not on file    Active member of club or organization: Not on file    Attends meetings of clubs or organizations: Not on file    Relationship status: Not on file  . Intimate partner violence    Fear of current or ex partner: Not on file    Emotionally abused: Not on file    Physically abused: Not on file    Forced sexual activity: Not on file  Other Topics Concern  . Not on file  Social History Narrative   Retired, works part time   Divorced   Former Smoker   1- Son alcoholic abusive   Daughter died in 06/05/2006    Family History  Problem Relation Age of Onset  . Heart disease Father   . Arthritis Other        Family history of  . Coronary artery disease Other        Family history of  . Hypertension Other        Family history of  . Diabetes Daughter        69 died    ROS: no fevers or chills, productive cough, hemoptysis, dysphasia, odynophagia, melena, hematochezia, dysuria, hematuria, rash, seizure activity, orthopnea, PND, pedal edema, claudication. Remaining systems are negative.  Physical Exam: Well-developed well-nourished in no acute distress.  Skin is warm and dry.  HEENT is normal.  Neck is supple.  Chest is clear to auscultation with normal expansion.  Cardiovascular exam is regular rate and rhythm.  Abdominal exam nontender or distended. No masses palpated. Extremities show no edema. neuro grossly intact  A/P  1 paroxysmal atrial fibrillation-patient remains in sinus rhythm.  Continue flecainide at present dose.  Continue metoprolol.  Patient continues to decline anticoagulation and understands the higher risk of stroke.  Continue aspirin.  2 hypertension-patient's blood pressure is controlled.  Continue present medications and follow.  3 aortic insufficiency-mild on most recent echocardiogram.  Kirk Ruths, MD

## 2018-09-05 ENCOUNTER — Telehealth: Payer: Self-pay | Admitting: Cardiology

## 2018-09-05 NOTE — Telephone Encounter (Signed)
1. 3. Confirm consent - "In the setting of the current Covid19 crisis, you are scheduled for a (phone or video) visit with your provider on (date) at (time).  Just as we do with many in-office visits, in order for you to participate in this visit, we must obtain consent.  If you'd like, I can send this to your mychart (if signed up) or email for you to review.  Otherwise, I can obtain your verbal consent now.  All virtual visits are billed to your insurance company just like a normal visit would be.  By agreeing to a virtual visit, we'd like you to understand that the technology does not allow for your provider to perform an examination, and thus may limit your provider's ability to fully assess your condition. If your provider identifies any concerns that need to be evaluated in person, we will make arrangements to do so.  Finally, though the technology is pretty good, we cannot assure that it will always work on either your or our end, and in the setting of a video visit, we may have to convert it to a phone-only visit.  In either situation, we cannot ensure that we have a secure connection.  Are you willing to proceed?" STAFF: Did the patient verbally acknowledge consent to telehealth visit? Document YES/NO here:  Yes       FULL LENGTH CONSENT FOR TELE-HEALTH VISIT   I hereby voluntarily request, consent and authorize [facility] and its employed or contracted physicians, physician assistants, nurse practitioners or other licensed health care professionals (the Practitioner), to provide me with telemedicine health care services (the Services") as deemed necessary by the treating Practitioner. I acknowledge and consent to receive the Services by the Practitioner via telemedicine. I understand that the telemedicine visit will involve communicating with the Practitioner through live audiovisual communication technology and the disclosure of certain medical information by electronic transmission. I  acknowledge that I have been given the opportunity to request an in-person assessment or other available alternative prior to the telemedicine visit and am voluntarily participating in the telemedicine visit.  I understand that I have the right to withhold or withdraw my consent to the use of telemedicine in the course of my care at any time, without affecting my right to future care or treatment, and that the Practitioner or I may terminate the telemedicine visit at any time. I understand that I have the right to inspect all information obtained and/or recorded in the course of the telemedicine visit and may receive copies of available information for a reasonable fee.  I understand that some of the potential risks of receiving the Services via telemedicine include:   Delay or interruption in medical evaluation due to technological equipment failure or disruption;  Information transmitted may not be sufficient (e.g. poor resolution of images) to allow for appropriate medical decision making by the Practitioner; and/or   In rare instances, security protocols could fail, causing a breach of personal health information.  Furthermore, I acknowledge that it is my responsibility to provide information about my medical history, conditions and care that is complete and accurate to the best of my ability. I acknowledge that Practitioner's advice, recommendations, and/or decision may be based on factors not within their control, such as incomplete or inaccurate data provided by me or distortions of diagnostic images or specimens that may result from electronic transmissions. I understand that the practice of medicine is not an exact science and that Practitioner makes no warranties  or guarantees regarding treatment outcomes. I acknowledge that I will receive a copy of this consent concurrently upon execution via email to the email address I last provided but may also request a printed copy by calling the office of  [place].    I understand that my insurance will be billed for this visit.   I have read or had this consent read to me.  I understand the contents of this consent, which adequately explains the benefits and risks of the Services being provided via telemedicine.   I have been provided ample opportunity to ask questions regarding this consent and the Services and have had my questions answered to my satisfaction.  I give my informed consent for the services to be provided through the use of telemedicine in my medical care  By participating in this telemedicine visit I agree to the above.

## 2018-09-09 ENCOUNTER — Telehealth (INDEPENDENT_AMBULATORY_CARE_PROVIDER_SITE_OTHER): Payer: Medicare Other | Admitting: Cardiology

## 2018-09-09 VITALS — Ht 64.0 in | Wt 130.0 lb

## 2018-09-09 DIAGNOSIS — I480 Paroxysmal atrial fibrillation: Secondary | ICD-10-CM

## 2018-09-09 DIAGNOSIS — I359 Nonrheumatic aortic valve disorder, unspecified: Secondary | ICD-10-CM | POA: Diagnosis not present

## 2018-09-09 DIAGNOSIS — I10 Essential (primary) hypertension: Secondary | ICD-10-CM

## 2018-09-09 NOTE — Patient Instructions (Signed)

## 2018-09-13 ENCOUNTER — Telehealth: Payer: Self-pay | Admitting: Internal Medicine

## 2018-09-13 NOTE — Telephone Encounter (Signed)
Pt called and stated that she needs an RX for Anoro inhaler. She would like to have this faxed so that she can get it free. Pt states that she needs [place] to fax it over to [facility] patient assistance program   Fax # [phone]

## 2018-09-16 ENCOUNTER — Ambulatory Visit (INDEPENDENT_AMBULATORY_CARE_PROVIDER_SITE_OTHER): Payer: Medicare Other | Admitting: Internal Medicine

## 2018-09-16 ENCOUNTER — Encounter: Payer: Self-pay | Admitting: Internal Medicine

## 2018-09-16 ENCOUNTER — Other Ambulatory Visit: Payer: Self-pay

## 2018-09-16 DIAGNOSIS — F411 Generalized anxiety disorder: Secondary | ICD-10-CM | POA: Diagnosis not present

## 2018-09-16 DIAGNOSIS — I10 Essential (primary) hypertension: Secondary | ICD-10-CM

## 2018-09-16 DIAGNOSIS — B029 Zoster without complications: Secondary | ICD-10-CM | POA: Diagnosis not present

## 2018-09-16 DIAGNOSIS — E538 Deficiency of other specified B group vitamins: Secondary | ICD-10-CM | POA: Diagnosis not present

## 2018-09-16 MED ORDER — VALACYCLOVIR HCL 1 G PO TABS: 1000.0000 mg | ORAL_TABLET | Freq: Three times a day (TID) | ORAL | 0 refills | Status: DC

## 2018-09-16 MED ORDER — VALACYCLOVIR HCL 500 MG PO TABS: 1000.0000 mg | ORAL_TABLET | Freq: Two times a day (BID) | ORAL | 1 refills | Status: DC

## 2018-09-16 NOTE — Assessment & Plan Note (Signed)
Clonazepam prn 

## 2018-09-16 NOTE — Assessment & Plan Note (Signed)
Valtrex Pt refused opioids Tylenol prn

## 2018-09-16 NOTE — Assessment & Plan Note (Signed)
On B12 

## 2018-09-16 NOTE — Patient Instructions (Addendum)
Shingles  Shingles is an infection. It gives you a painful skin rash and blisters that have fluid in them. Shingles is caused by the same germ (virus) that causes chickenpox. Shingles only happens in people who:  Have had chickenpox.  Have been given a shot of medicine (vaccine) to protect against chickenpox. Shingles is rare in this group. The first symptoms of shingles may be itching, tingling, or pain in an area on your skin. A rash will show on your skin a few days or weeks later. The rash is likely to be on one side of your body. The rash usually has a shape like a belt or a band. Over time, the rash turns into fluid-filled blisters. The blisters will break open, change into scabs, and dry up. Medicines may:  Help with pain and itching.  Help you get better sooner.  Help to prevent long-term problems. Follow these instructions at home: Medicines  Take over-the-counter and prescription medicines only as told by your doctor.  Put on an anti-itch cream or numbing cream where you have a rash, blisters, or scabs. Do this as told by your doctor. Helping with itching and discomfort   Put cold, wet cloths (cold compresses) on the area of the rash or blisters as told by your doctor.  Cool baths can help you feel better. Try adding baking soda or dry oatmeal to the water to lessen itching. Do not bathe in hot water. Blister and rash care  Keep your rash covered with a loose bandage (dressing).  Wear loose clothing that does not rub on your rash.  Keep your rash and blisters clean. To do this, wash the area with mild soap and cool water as told by your doctor.  Check your rash every day for signs of infection. Check for: ? More redness, swelling, or pain. ? Fluid or blood. ? Warmth. ? Pus or a bad smell.  Do not scratch your rash. Do not pick at your blisters. To help you to not scratch: ? Keep your fingernails clean and cut short. ? Wear gloves or mittens when you sleep, if  scratching is a problem. General instructions  Rest as told by your doctor.  Keep all follow-up visits as told by your doctor. This is important.  Wash your hands often with soap and water. If soap and water are not available, use hand sanitizer. Doing this lowers your chance of getting a skin infection caused by germs (bacteria).  Your infection can cause chickenpox in people who have never had chickenpox or never got a shot of chickenpox vaccine. If you have blisters that did not change into scabs yet, try not to touch other people or be around other people, especially: ? Babies. ? Pregnant women. ? Children who have areas of red, itchy, or rough skin (eczema). ? Very old people who have transplants. ? People who have a long-term (chronic) sickness, like cancer or AIDS. Contact a doctor if:  Your pain does not get better with medicine.  Your pain does not get better after the rash heals.  You have any signs of infection in the rash area. These signs include: ? More redness, swelling, or pain around the rash. ? Fluid or blood coming from the rash. ? The rash area feeling warm to the touch. ? Pus or a bad smell coming from the rash. Get help right away if:  The rash is on your face or nose.  You have pain in your face or pain by   your eye.  You lose feeling on one side of your face.  You have trouble seeing.  You have ear pain, or you have ringing in your ear.  You have a loss of taste.  Your condition gets worse. Summary  Shingles gives you a painful skin rash and blisters that have fluid in them.  Shingles is an infection. It is caused by the same germ (virus) that causes chickenpox.  Keep your rash covered with a loose bandage (dressing). Wear loose clothing that does not rub on your rash.  If you have blisters that did not change into scabs yet, try not to touch other people or be around people. This information is not intended to replace advice given to you by  your health care provider. Make sure you discuss any questions you have with your health care provider. Document Released: 08/09/2007 Document Revised: 06/14/2018 Document Reviewed: 10/25/2016 Elsevier Patient Education  2020 [employer].   If you have medicare related insurance (such as traditional Medicare, [employer], [employer], or similar), Please make an appointment at the scheduling desk with Sharee Pimple, the [employer], for your Wellness visit in this office, which is a benefit with your insurance.

## 2018-09-16 NOTE — Progress Notes (Signed)
Subjective:  Patient ID: [name], female    DOB: January 04, 1936  Age: 83 y.o. MRN: [id number]  CC: No chief complaint on file.   HPI AKI BURDIN presents for R CP x 1-2 d; rash  x 1 week F/u anxiety, HTN  Outpatient Medications Prior to Visit  Medication Sig Dispense Refill  . amLODipine (NORVASC) 5 MG tablet TAKE 1 TABLET(5 MG) BY MOUTH DAILY 90 tablet 1  . Aspirin-Salicylamide-Caffeine (BC HEADACHE POWDER PO) Take 1 packet by mouth daily as needed (headaches).    . Biotin 1000 MCG tablet Take 1,000 mcg by mouth daily.    . Cholecalciferol (CVS VITAMIN D3) 1000 UNITS capsule Take 1,000 Units by mouth daily.      . clonazePAM (KLONOPIN) 0.5 MG tablet TAKE 1 TABLET BY MOUTH AT BEDTIME AS NEEDED FOR LEG CRAMPS 30 tablet 3  . cyanocobalamin 1000 MCG tablet Take 1,000 mcg by mouth daily.    . flecainide (TAMBOCOR) 50 MG tablet Take 1 tablet (50 mg total) by mouth 2 (two) times daily. 180 tablet 0  . metoprolol tartrate (LOPRESSOR) 25 MG tablet TAKE 1/2 TABLET(12.5 MG) BY MOUTH TWICE DAILY 90 tablet 1  . pantoprazole (PROTONIX) 40 MG tablet Take 1 tablet (40 mg total) by mouth daily. 30 tablet 11   No facility-administered medications prior to visit.     ROS: Review of Systems  Constitutional: Negative for activity change, appetite change, chills, fatigue and unexpected weight change.  HENT: Negative for congestion, mouth sores and sinus pressure.   Eyes: Negative for visual disturbance.  Respiratory: Negative for cough and chest tightness.   Cardiovascular: Positive for chest pain.  Gastrointestinal: Negative for abdominal pain and nausea.  Genitourinary: Negative for difficulty urinating, frequency and vaginal pain.  Musculoskeletal: Negative for back pain and gait problem.  Skin: Positive for rash. Negative for pallor.  Neurological: Negative for dizziness, tremors, weakness, numbness and headaches.  Psychiatric/Behavioral: Negative for confusion and sleep disturbance.      Objective:  BP (!) 146/78 (BP Location: Left Arm, Patient Position: Sitting, Cuff Size: Normal)   Pulse 67   Temp 97.6 F (36.4 C) (Oral)   Ht 5\' 4"  (1.626 m)   Wt 141 lb (64 kg)   SpO2 96%   BMI 24.20 kg/m   BP Readings from Last 3 Encounters:  09/16/18 (!) 146/78  05/06/18 140/76  04/29/18 (!) 180/61    Wt Readings from Last 3 Encounters:  09/16/18 141 lb (64 kg)  09/09/18 130 lb (59 kg)  05/06/18 140 lb (63.5 kg)    Physical Exam Constitutional:      General: She is not in acute distress.    Appearance: She is well-developed.  HENT:     Head: Normocephalic.     Right Ear: External ear normal.     Left Ear: External ear normal.     Nose: Nose normal.  Eyes:     General:        Right eye: No discharge.        Left eye: No discharge.     Conjunctiva/sclera: Conjunctivae normal.     Pupils: Pupils are equal, round, and reactive to light.  Neck:     Musculoskeletal: Normal range of motion and neck supple.     Thyroid: No thyromegaly.     Vascular: No JVD.     Trachea: No tracheal deviation.  Cardiovascular:     Rate and Rhythm: Normal rate and regular rhythm.  Heart sounds: Normal heart sounds.  Pulmonary:     Effort: No respiratory distress.     Breath sounds: No stridor. No wheezing.  Abdominal:     General: Bowel sounds are normal. There is no distension.     Palpations: Abdomen is soft. There is no mass.     Tenderness: There is no abdominal tenderness. There is no guarding or rebound.  Musculoskeletal:        General: No tenderness.  Lymphadenopathy:     Cervical: No cervical adenopathy.  Skin:    Findings: Erythema and rash present.  Neurological:     Cranial Nerves: No cranial nerve deficit.     Motor: No abnormal muscle tone.     Coordination: Coordination normal.     Deep Tendon Reflexes: Reflexes normal.  Psychiatric:        Behavior: Behavior normal.        Thought Content: Thought content normal.        Judgment: Judgment normal.      Lab Results  Component Value Date   WBC 11.5 (H) 04/29/2018   HGB 12.8 04/29/2018   HCT 41.4 04/29/2018   PLT 391 04/29/2018   GLUCOSE 93 04/29/2018   CHOL 199 03/21/2013   TRIG 107.0 03/21/2013   HDL 57.50 03/21/2013   LDLDIRECT 144.3 01/31/2011   LDLCALC 120 (H) 03/21/2013   ALT 9 01/01/2018   AST 17 01/01/2018   NA 144 04/29/2018   K 4.8 04/29/2018   CL 112 (H) 04/29/2018   CREATININE 1.42 (H) 04/29/2018   BUN 29 (H) 04/29/2018   CO2 23 04/29/2018   TSH 1.91 01/01/2018   INR 0.9 08/13/2008    Dg Chest 2 View  Result Date: 04/29/2018 CLINICAL DATA:  Chest pain. EXAM: CHEST - 2 VIEW COMPARISON:  01/01/2017 FINDINGS: The heart size and mediastinal contours are within normal limits. Aortic atherosclerosis. Both lungs are clear. The visualized skeletal structures are unremarkable. IMPRESSION: 1. No acute cardiopulmonary abnormalities. 2.  Aortic Atherosclerosis (ICD10-I70.0). Electronically Signed   By: Kerby Moors M.D.   On: 04/29/2018 14:12    Assessment & Plan:   There are no diagnoses linked to this encounter.   No orders of the defined types were placed in this encounter.    Follow-up: No follow-ups on file.  Walker Kehr, MD

## 2018-09-17 MED ORDER — ANORO ELLIPTA 62.5-25 MCG/INH IN AEPB: 1.0000 | INHALATION_SPRAY | Freq: Every day | RESPIRATORY_TRACT | 3 refills | Status: DC

## 2018-09-17 NOTE — Telephone Encounter (Signed)
Was discontinued, do you want patient in this inhaler?

## 2018-09-17 NOTE — Telephone Encounter (Signed)
Ok Thx 

## 2018-09-17 NOTE — Addendum Note (Signed)
Addended by: Cassandria Anger on: 09/17/2018 02:44 PM   Modules accepted: Orders

## 2018-09-18 ENCOUNTER — Ambulatory Visit: Payer: Self-pay | Admitting: Internal Medicine

## 2018-09-18 DIAGNOSIS — J438 Other emphysema: Secondary | ICD-10-CM | POA: Diagnosis not present

## 2018-09-18 DIAGNOSIS — J449 Chronic obstructive pulmonary disease, unspecified: Secondary | ICD-10-CM | POA: Diagnosis not present

## 2018-09-18 NOTE — Telephone Encounter (Signed)
faxed

## 2018-09-18 NOTE — Telephone Encounter (Signed)
Pt. Reports after she takes the Valtrex she has nausea and blurred vision. Clears up "after awhile." "I don't know if I should keep taking it." Would like to know what Dr. Alain Marion thinks. Please advise pt.  Answer Assessment - Initial Assessment Questions 1.   NAME of MEDICATION: "What medicine are you calling about?"     Valtrex 2.   QUESTION: "What is your question?"     Feels like Valtrex is causing blurred vision and nausea 3.   PRESCRIBING HCP: "Who prescribed it?" Reason: if prescribed by specialist, call should be referred to that group.     Dr. Alain Marion 4. SYMPTOMS: "Do you have any symptoms?"     Yes 5. SEVERITY: If symptoms are present, ask "Are they mild, moderate or severe?"     Moderate 6.  PREGNANCY:  "Is there any chance that you are pregnant?" "When was your last menstrual period?"     No  Protocols used: MEDICATION QUESTION CALL-A-AH

## 2018-09-18 NOTE — Telephone Encounter (Signed)
Please advise 

## 2018-09-19 ENCOUNTER — Telehealth: Payer: Self-pay | Admitting: Internal Medicine

## 2018-09-19 NOTE — Telephone Encounter (Signed)
Reduce to dose to 500 mg tid Thx

## 2018-09-19 NOTE — Telephone Encounter (Signed)
Noted. Thx.

## 2018-09-19 NOTE — Telephone Encounter (Signed)
FYI. Pt states she stopped taking the medication yesterday, I informed her she needs to take the medication to feel better. She doesn't think reducing from 4 to 3 pills will make a difference. She also states she has to crush the pills because they are to big for her to swallow.

## 2018-09-19 NOTE — Telephone Encounter (Signed)
Copied from [place] [id number]. Topic: General - Inquiry >> Sep 19, 2018 11:56 AM Richardo Priest, NT wrote: Reason for CRM: Patient called in stating she is going to continue using the medication she already is using for Shingles. States its okay to discontinue medication sent on 7/13.

## 2018-09-19 NOTE — Telephone Encounter (Signed)
The medication sent in on 07/13 is the medication she is currently taking. Pt notified and voiced understanding

## 2018-09-24 ENCOUNTER — Ambulatory Visit: Payer: Self-pay

## 2018-09-24 NOTE — Telephone Encounter (Signed)
Patient called in stating she believes she has shingles. States they are under her breast and down back. States she has a friend that used peroxide to dry them out and she is wondering if she can due to medication not working. Please advise. Call back is [phone].  Rash is located both sides of back and under left breast. Rash red spots blisters are beginning to crust over. Pt stated that she is having all itching all over her body that comes and goes.  Care advice given and pt verbalized understanding.  Advised pt not to use hydrogen peroxide.   Reason for Disposition . [1] Shingles rash already diagnosed and [2] taking antiviral medication  Answer Assessment - Initial Assessment Questions 1. APPEARANCE of RASH: "Describe the rash."      Red with blisters 2. LOCATION: "Where is the rash located?"      Both sides of back and under left breast 3. ONSET: "When did the rash start?"      Last Monday 4. ITCHING: "Does the rash itch?" If so, ask: "How bad is the itch?"  (Scale 1-10; or mild, moderate, severe)     Yes-mild 5. PAIN: "Does the rash hurt?" If so, ask: "How bad is the pain?"  (Scale 1-10; or mild, moderate, severe)     Yes- severe  6. OTHER SYMPTOMS: "Do you have any other symptoms?" (e.g., fever)     Generalized itching that comes and goes 7. PREGNANCY: "Is there any chance you are pregnant?" "When was your last menstrual period?"     n/a  Protocols used: [facility]

## 2018-09-26 ENCOUNTER — Ambulatory Visit: Payer: Self-pay | Admitting: *Deleted

## 2018-09-26 NOTE — Telephone Encounter (Signed)
Summary: shingles    Patient states that she has shingles and the area under her breast is hurting really bad. She states it feels like "raw meat" and inquired what she can take to alleviate symptoms. Please advise.      Patient has recently been diagnosed with shingles- patient is not able to wear bra and breast are touching the raw area. Patient is burning on the skin and she wants to know what she can use. Patient is using tylenol for pain- she does not want oral pills. She states she can handle the pain- it is the burning under the breast that is bothersome to her. She wants to know if there is anything she can use for the pain there. Told patient there may not be any lotions that are effective- but will check with provider. She is going to try to lift breast with "sling" to keep from "rubbing" in the area which is causing the burning.  Patient also wants PCP to know that she is still experiencing blisters and the areas on back and breast are not healing yet- she will be finishing the antiviral in a couple days and it is extremely expensive and she can not afford another Rx of that.  Reason for Disposition . [1] Shingles rash already diagnosed and [2] taking antiviral medication  Protocols used: [facility]

## 2018-09-27 MED ORDER — HYDROCODONE-ACETAMINOPHEN 7.5-325 MG PO TABS: 0.5000 | ORAL_TABLET | Freq: Four times a day (QID) | ORAL | 0 refills | Status: DC | PRN

## 2018-09-27 NOTE — Addendum Note (Signed)
Addended by: Cassandria Anger on: 09/27/2018 02:52 PM   Modules accepted: Orders

## 2018-09-27 NOTE — Telephone Encounter (Signed)
Norco Rx emailed Thx

## 2018-09-30 ENCOUNTER — Other Ambulatory Visit: Payer: Self-pay | Admitting: Cardiology

## 2018-09-30 NOTE — Telephone Encounter (Signed)
Pt informed. She states she does not want narcotic pain meds. I advised pt to pick up medication to keep on hand.

## 2018-10-04 ENCOUNTER — Ambulatory Visit: Payer: Self-pay

## 2018-10-04 NOTE — Telephone Encounter (Signed)
Returned call to patient who states She is being treated for Shingles. She states today the lesions are burning burning.  She states that she is taking medication prescribed by Dr Alain Marion. Pt was advised to stay out of the heat. To continue her medication. She states that her sister says the rash to her back appears to be drying. Ms Hursey states they are still painful and she has difficulty sitting and has to lay on her left side to sleep.  She states the lesions under her breast burn all the time.  She denies fever. Care advice read to patient. Patient verbalized understanding. She was told to call back or go to urgent care if rash develops in other areas or she develops a fever.  Reason for Disposition . [1] Shingles rash already diagnosed and [2] taking antiviral medication  Answer Assessment - Initial Assessment Questions 1. APPEARANCE of RASH: "Describe the rash."      Dry areas that were blisters 2. LOCATION: "Where is the rash located?"     Back and under breastright 3. ONSET: "When did the rash start?"      2-3 weeks ago 4. ITCHING: "Does the rash itch?" If so, ask: "How bad is the itch?"  (Scale 1-10; or mild, moderate, severe)    burns 5. PAIN: "Does the rash hurt?" If so, ask: "How bad is the pain?"  (Scale 1-10; or mild, moderate, severe)     Burning 5 6. OTHER SYMPTOMS: "Do you have any other symptoms?" (e.g., fever)    no 7. PREGNANCY: "Is there any chance you are pregnant?" "When was your last menstrual period?"    N/A  Protocols used: [facility]

## 2018-10-10 ENCOUNTER — Ambulatory Visit: Payer: Self-pay | Admitting: Internal Medicine

## 2018-10-10 NOTE — Telephone Encounter (Signed)
Pt reports both feet swelling, onset 4 days ago. States "Mostly ankles and toes." States can not get shoes on. Denies any redness or warmth, states "Sore when I rub them." States she has not been walking much because of the shingles. Also reports "I eat a lot of salt." Denies any SOB,H/O COPD, "My breathing has been good." States has been elevating legs in recliner.Also reports shingles under breasts are very painful "Blistering." Unable to do virtual; declined any appt.  Assured pt [place] would route to practice for Dr. Judeen Hammans review. Care advise given per protocol.  Pt also requesting prescription for Anoro. Sates has been getting it free via mail for one year, needs refill through '[facility]' ? , no other information.  Please advise: [phone]  Reason for Disposition . [1] MILD swelling of both ankles (i.e., pedal edema) AND [2] new onset or worsening  Answer Assessment - Initial Assessment Questions 1. ONSET: "When did the swelling start?" (e.g., minutes, hours, days)     4 days ago 2. LOCATION: "What part of the leg is swollen?"  "Are both legs swollen or just one leg?"     Feet, ankles, "Maybe knees" 3. SEVERITY: "How bad is the swelling?" (e.g., localized; mild, moderate, severe)  - Localized - small area of swelling localized to one leg  - MILD pedal edema - swelling limited to foot and ankle, pitting edema < 1/4 inch (6 mm) deep, rest and elevation eliminate most or all swelling  - MODERATE edema - swelling of lower leg to knee, pitting edema > 1/4 inch (6 mm) deep, rest and elevation only partially reduce swelling  - SEVERE edema - swelling extends above knee, facial or hand swelling present     Moderate, "Mostly toes and ankle" 4. REDNESS: "Does the swelling look red or infected?"     no 5. PAIN: "Is the swelling painful to touch?" If so, ask: "How painful is it?"   (Scale 1-10; mild, moderate or severe)     Tender to touch 6. FEVER: "Do you have a fever?" If so, ask: "What is it,  how was it measured, and when did it start?"      no 7. CAUSE: "What do you think is causing the leg swelling?"     Unsure 8. MEDICAL HISTORY: "Do you have a history of heart failure, kidney disease, liver failure, or cancer?"      9. RECURRENT SYMPTOM: "Have you had leg swelling before?" If so, ask: "When was the last time?" "What happened that time?"     10. OTHER SYMPTOMS: "Do you have any other symptoms?" (e.g., chest pain, difficulty breathing)       SOB "Normal with COPD not bad "  Protocols used: LEG SWELLING AND EDEMA-A-AH

## 2018-10-19 DIAGNOSIS — J449 Chronic obstructive pulmonary disease, unspecified: Secondary | ICD-10-CM | POA: Diagnosis not present

## 2018-10-19 DIAGNOSIS — J438 Other emphysema: Secondary | ICD-10-CM | POA: Diagnosis not present

## 2018-10-23 DIAGNOSIS — H16223 Keratoconjunctivitis sicca, not specified as Sjogren's, bilateral: Secondary | ICD-10-CM | POA: Diagnosis not present

## 2018-10-23 DIAGNOSIS — H353112 Nonexudative age-related macular degeneration, right eye, intermediate dry stage: Secondary | ICD-10-CM | POA: Diagnosis not present

## 2018-10-23 DIAGNOSIS — Z961 Presence of intraocular lens: Secondary | ICD-10-CM | POA: Diagnosis not present

## 2018-10-23 DIAGNOSIS — H26491 Other secondary cataract, right eye: Secondary | ICD-10-CM | POA: Diagnosis not present

## 2018-10-23 DIAGNOSIS — H353220 Exudative age-related macular degeneration, left eye, stage unspecified: Secondary | ICD-10-CM | POA: Diagnosis not present

## 2018-10-28 ENCOUNTER — Other Ambulatory Visit: Payer: Self-pay

## 2018-10-28 ENCOUNTER — Encounter: Payer: Self-pay | Admitting: Internal Medicine

## 2018-10-28 ENCOUNTER — Ambulatory Visit (INDEPENDENT_AMBULATORY_CARE_PROVIDER_SITE_OTHER): Payer: Medicare Other | Admitting: Internal Medicine

## 2018-10-28 VITALS — BP 148/80 | HR 68 | Temp 98.0°F | Ht 64.0 in | Wt 139.0 lb

## 2018-10-28 DIAGNOSIS — F411 Generalized anxiety disorder: Secondary | ICD-10-CM | POA: Diagnosis not present

## 2018-10-28 DIAGNOSIS — I480 Paroxysmal atrial fibrillation: Secondary | ICD-10-CM

## 2018-10-28 DIAGNOSIS — B029 Zoster without complications: Secondary | ICD-10-CM

## 2018-10-28 DIAGNOSIS — Z23 Encounter for immunization: Secondary | ICD-10-CM

## 2018-10-28 DIAGNOSIS — I10 Essential (primary) hypertension: Secondary | ICD-10-CM | POA: Diagnosis not present

## 2018-10-28 NOTE — Assessment & Plan Note (Signed)
Flecainide, ASA, Toprol

## 2018-10-28 NOTE — Patient Instructions (Signed)
If you have medicare related insurance (such as traditional Medicare, Blue Cross Medicare, United Health[employer] Medicare, or similar), Please make an appointment at the scheduling desk with Jill, the Wellness Health Coach, for your Wellness visit in this office, which is a benefit with your insurance.  

## 2018-10-28 NOTE — Assessment & Plan Note (Signed)
Norvasc

## 2018-10-28 NOTE — Assessment & Plan Note (Signed)
Better - healing Residual pain - Tylenol prn

## 2018-10-28 NOTE — Assessment & Plan Note (Signed)
On Clonazepam prn.

## 2018-10-28 NOTE — Progress Notes (Signed)
Subjective:  Patient ID: [name], female    DOB: [birth year]  Age: 83 y.o. MRN: [id number]  CC: No chief complaint on file.   HPI [name] presents for shingles. Pain is better... F/u anxiety, HTN  Outpatient Medications Prior to Visit  Medication Sig Dispense Refill  . amLODipine (NORVASC) 5 MG tablet TAKE 1 TABLET(5 MG) BY MOUTH DAILY 90 tablet 1  . Aspirin-Salicylamide-Caffeine (BC HEADACHE POWDER PO) Take 1 packet by mouth daily as needed (headaches).    . Biotin 1000 MCG tablet Take 1,000 mcg by mouth daily.    . Cholecalciferol ([employer] VITAMIN D3) 1000 UNITS capsule Take 1,000 Units by mouth daily.      . clonazePAM (KLONOPIN) 0.5 MG tablet TAKE 1 TABLET BY MOUTH AT BEDTIME AS NEEDED FOR LEG CRAMPS 30 tablet 3  . cyanocobalamin 1000 MCG tablet Take 1,000 mcg by mouth daily.    . flecainide (TAMBOCOR) 50 MG tablet TAKE 1 TABLET(50 MG) BY MOUTH TWICE DAILY 180 tablet 1  . HYDROcodone-acetaminophen (NORCO) 7.5-325 MG tablet Take 0.5-1 tablets by mouth every 6 (six) hours as needed for severe pain. 20 tablet 0  . metoprolol tartrate (LOPRESSOR) 25 MG tablet TAKE 1/2 TABLET(12.5 MG) BY MOUTH TWICE DAILY 90 tablet 1  . pantoprazole (PROTONIX) 40 MG tablet Take 1 tablet (40 mg total) by mouth daily. 30 tablet 11  . umeclidinium-vilanterol (ANORO ELLIPTA) 62.5-25 MCG/INH AEPB Inhale 1 puff into the lungs daily. 3 each 3  . valACYclovir (VALTREX) 500 MG tablet Take 2 tablets (1,000 mg total) by mouth 2 (two) times daily. (Patient not taking: Reported on 10/28/2018) 42 tablet 1   No facility-administered medications prior to visit.     ROS: Review of Systems  Constitutional: Negative for activity change, appetite change, chills, fatigue and unexpected weight change.  HENT: Negative for congestion, mouth sores and sinus pressure.   Eyes: Positive for visual disturbance.  Respiratory: Negative for cough and chest tightness.   Cardiovascular: Positive for chest pain.   Gastrointestinal: Negative for abdominal pain and nausea.  Genitourinary: Negative for difficulty urinating, frequency and vaginal pain.  Musculoskeletal: Negative for back pain and gait problem.  Skin: Negative for pallor and rash.  Neurological: Negative for dizziness, tremors, weakness, numbness and headaches.  Psychiatric/Behavioral: Negative for confusion and sleep disturbance.    Objective:  BP (!) 148/80 (BP Location: Left Arm, Patient Position: Sitting, Cuff Size: Normal)   Pulse 68   Temp 98 F (36.7 C) (Oral)   Ht 5\' 4"  (1.626 m)   Wt 139 lb (63 kg)   SpO2 96%   BMI 23.86 kg/m   BP Readings from Last 3 Encounters:  10/28/18 (!) 148/80  09/16/18 (!) 146/78  05/06/18 140/76    Wt Readings from Last 3 Encounters:  10/28/18 139 lb (63 kg)  09/16/18 141 lb (64 kg)  09/09/18 130 lb (59 kg)    Physical Exam Constitutional:      General: She is not in acute distress.    Appearance: She is well-developed.  HENT:     Head: Normocephalic.     Right Ear: External ear normal.     Left Ear: External ear normal.     Nose: Nose normal.  Eyes:     General:        Right eye: No discharge.        Left eye: No discharge.     Conjunctiva/sclera: Conjunctivae normal.     Pupils: Pupils are equal, round,  and reactive to light.  Neck:     Musculoskeletal: Normal range of motion and neck supple.     Thyroid: No thyromegaly.     Vascular: No JVD.     Trachea: No tracheal deviation.  Cardiovascular:     Rate and Rhythm: Normal rate and regular rhythm.     Heart sounds: Normal heart sounds.  Pulmonary:     Effort: No respiratory distress.     Breath sounds: No stridor. No wheezing.  Abdominal:     General: Bowel sounds are normal. There is no distension.     Palpations: Abdomen is soft. There is no mass.     Tenderness: There is no abdominal tenderness. There is no guarding or rebound.  Musculoskeletal:        General: No tenderness.  Lymphadenopathy:     Cervical: No  cervical adenopathy.  Skin:    Findings: No erythema or rash.  Neurological:     Cranial Nerves: No cranial nerve deficit.     Motor: No abnormal muscle tone.     Coordination: Coordination normal.     Deep Tendon Reflexes: Reflexes normal.  Psychiatric:        Behavior: Behavior normal.        Thought Content: Thought content normal.        Judgment: Judgment normal.   scars on skin  Lab Results  Component Value Date   WBC 11.5 (H) 04/29/2018   HGB 12.8 04/29/2018   HCT 41.4 04/29/2018   PLT 391 04/29/2018   GLUCOSE 93 04/29/2018   CHOL 199 03/21/2013   TRIG 107.0 03/21/2013   HDL 57.50 03/21/2013   LDLDIRECT 144.3 01/31/2011   LDLCALC 120 (H) 03/21/2013   ALT 9 01/01/2018   AST 17 01/01/2018   NA 144 04/29/2018   K 4.8 04/29/2018   CL 112 (H) 04/29/2018   CREATININE 1.42 (H) 04/29/2018   BUN 29 (H) 04/29/2018   CO2 23 04/29/2018   TSH 1.91 01/01/2018   INR 0.9 08/13/2008    Dg Chest 2 View  Result Date: 04/29/2018 CLINICAL DATA:  Chest pain. EXAM: CHEST - 2 VIEW COMPARISON:  01/01/2017 FINDINGS: The heart size and mediastinal contours are within normal limits. Aortic atherosclerosis. Both lungs are clear. The visualized skeletal structures are unremarkable. IMPRESSION: 1. No acute cardiopulmonary abnormalities. 2.  Aortic Atherosclerosis (ICD10-I70.0). Electronically Signed   By: Kerby Moors M.D.   On: 04/29/2018 14:12    Assessment & Plan:   Diagnoses and all orders for this visit:  Need for influenza vaccination -     Flu Vaccine QUAD High Dose(Fluad)     No orders of the defined types were placed in this encounter.    Follow-up: No follow-ups on file.  Walker Kehr, MD

## 2018-10-29 DIAGNOSIS — H35371 Puckering of macula, right eye: Secondary | ICD-10-CM | POA: Diagnosis not present

## 2018-10-29 DIAGNOSIS — H353111 Nonexudative age-related macular degeneration, right eye, early dry stage: Secondary | ICD-10-CM | POA: Diagnosis not present

## 2018-10-29 DIAGNOSIS — H43812 Vitreous degeneration, left eye: Secondary | ICD-10-CM | POA: Diagnosis not present

## 2018-10-29 DIAGNOSIS — H353222 Exudative age-related macular degeneration, left eye, with inactive choroidal neovascularization: Secondary | ICD-10-CM | POA: Diagnosis not present

## 2018-11-01 DIAGNOSIS — H353222 Exudative age-related macular degeneration, left eye, with inactive choroidal neovascularization: Secondary | ICD-10-CM | POA: Diagnosis not present

## 2018-11-01 DIAGNOSIS — H353111 Nonexudative age-related macular degeneration, right eye, early dry stage: Secondary | ICD-10-CM | POA: Diagnosis not present

## 2018-11-20 ENCOUNTER — Telehealth: Payer: Self-pay | Admitting: Internal Medicine

## 2018-11-20 NOTE — Telephone Encounter (Signed)
Pt states she has had to restart the paperwork for umeclidinium-vilanterol (ANORO ELLIPTA) 62.5-25 MCG/INH AEPB assistance program.  Pt wants to know if the office could supply her with a sample until she is able to get everything completed.

## 2018-11-22 NOTE — Telephone Encounter (Signed)
Pt notifed to come and pick up

## 2018-11-22 NOTE — Telephone Encounter (Signed)
Pt calling and stated she called 2 days ago and no one has gotten back with her to see if she can get samples of Anoro Ellipta . Please call pt

## 2018-11-29 ENCOUNTER — Other Ambulatory Visit: Payer: Self-pay

## 2018-11-29 ENCOUNTER — Other Ambulatory Visit: Payer: Self-pay | Admitting: Internal Medicine

## 2018-11-29 DIAGNOSIS — I480 Paroxysmal atrial fibrillation: Secondary | ICD-10-CM

## 2018-11-29 MED ORDER — AMLODIPINE BESYLATE 5 MG PO TABS: ORAL_TABLET | ORAL | 1 refills | Status: DC

## 2018-12-03 DIAGNOSIS — H35371 Puckering of macula, right eye: Secondary | ICD-10-CM | POA: Diagnosis not present

## 2018-12-03 DIAGNOSIS — H353111 Nonexudative age-related macular degeneration, right eye, early dry stage: Secondary | ICD-10-CM | POA: Diagnosis not present

## 2018-12-03 DIAGNOSIS — H43812 Vitreous degeneration, left eye: Secondary | ICD-10-CM | POA: Diagnosis not present

## 2018-12-03 DIAGNOSIS — H353221 Exudative age-related macular degeneration, left eye, with active choroidal neovascularization: Secondary | ICD-10-CM | POA: Diagnosis not present

## 2018-12-10 ENCOUNTER — Ambulatory Visit: Payer: Self-pay

## 2018-12-10 NOTE — Telephone Encounter (Signed)
Incomigcall from Pt. Complaint of SOB.  When walking around and talkling on the phone.  Rates it moderately. Denies its a migraine.    Complains of SOB also.  Denies Cardiac issues.   Reviewed. Protocol with Patient . Recommend Pt go to Urgent Care  Or ed for further evaluation.  Pt is refusing to go   Reason for Disposition . Severe difficulty breathing (e.g., struggling for each breath, speaks in single words)  Answer Assessment - Initial Assessment Questions 1. LOCATION: "Where does it hurt?"     continuosly 2. ONSET: "When did the headache start?" (Minutes, hours or days)      4days ago 3. PATTERN: "Does the pain come and go, or has it been constant since it started?"     4. SEVERITY: "How bad is the pain?" and "What does it keep you from doing?"  (e.g., Scale 1-10; mild, moderate, or severe)   - MILD (1-3): doesn't interfere with normal activities    - MODERATE (4-7): interferes with normal activities or awakens from sleep    - SEVERE (8-10): excruciating pain, unable to do any normal activities       moderate 5. RECURRENT SYMPTOM: "Have you ever had headaches before?" If so, ask: "When was the last time?" and "What happened that time?"       6. CAUSE: "What do you think is causing the headache?"     7. MIGRAINE: "Have you been diagnosed with migraine headaches?" If so, ask: "Is this headache similar?"      denies 8. HEAD INJURY: "Has there been any recent injury to the head?"     no 9. OTHER SYMPTOMS: "Do you have any other symptoms?" (fever, stiff neck, eye pain, sore throat, cold symptoms)     denies10. PREGNANCY: "Is there any chance you are pregnant?" "When was your last menstrual period?"       *No Answer*  Answer Assessment - Initial Assessment Questions 1. RESPIRATORY STATUS: "Describe your breathing?" (e.g., wheezing, shortness of breath, unable to speak, severe coughing)     SOB 2. ONSET: "When did this breathing problem begin?"     4 days ago 3. PATTERN "Does the  difficult breathing come and go, or has it been constant since it started?"      constant4. SEVERITY: "How bad is your breathing?" (e.g., mild, moderate, severe)    - MILD: No SOB at rest, mild SOB with walking, speaks normally in sentences, can lay down, no retractions, pulse < 100.    - MODERATE: SOB at rest, SOB with minimal exertion and prefers to sit, cannot lie down flat, speaks in phrases, mild retractions, audible wheezing, pulse 100-120.    - SEVERE: Very SOB at rest, speaks in single words, struggling to breathe, sitting hunched forward, retractions, pulse > 120      Moderate  5. RECURRENT SYMPTOM: "Have you had difficulty breathing before?" If so, ask: "When was the last time?" and "What happened that time?"       6. CARDIAC HISTORY: "Do you have any history of heart disease?" (e.g., heart attack, angina, bypass surgery, angioplasty)     denies 7. LUNG HISTORY: "Do you have any history of lung disease?"  (e.g., pulmonary embolus, asthma, emphysema)    CooOPd 8. CAUSE: "What do you think is causing the breathing problem?"      *No Answer* 9. OTHER SYMPTOMS: "Do you have any other symptoms? (e.g., dizziness, runny nose, cough, chest pain, fever)     *No  Answer* 10. PREGNANCY: "Is there any chance you are pregnant?" "When was your last menstrual period?"       *No Answer* 11. TRAVEL: "Have you traveled out of the country in the last month?" (e.g., travel history, exposures)       *No Answer*  Protocols used: BREATHING DIFFICULTY-A-AH, HEADACHE-A-AH

## 2018-12-11 NOTE — Telephone Encounter (Signed)
Please advise 

## 2018-12-13 ENCOUNTER — Ambulatory Visit (INDEPENDENT_AMBULATORY_CARE_PROVIDER_SITE_OTHER): Payer: Medicare Other | Admitting: Family

## 2018-12-13 ENCOUNTER — Telehealth: Payer: Self-pay | Admitting: Family

## 2018-12-13 ENCOUNTER — Other Ambulatory Visit: Payer: Self-pay

## 2018-12-13 ENCOUNTER — Encounter: Payer: Self-pay | Admitting: Family

## 2018-12-13 VITALS — BP 132/76 | HR 60 | Temp 97.8°F | Ht 64.0 in | Wt 138.0 lb

## 2018-12-13 DIAGNOSIS — J449 Chronic obstructive pulmonary disease, unspecified: Secondary | ICD-10-CM

## 2018-12-13 DIAGNOSIS — R0602 Shortness of breath: Secondary | ICD-10-CM | POA: Diagnosis not present

## 2018-12-13 MED ORDER — AZITHROMYCIN 250 MG PO TABS: ORAL_TABLET | ORAL | 0 refills | Status: DC

## 2018-12-13 MED ORDER — PREDNISONE 20 MG PO TABS: 20.0000 mg | ORAL_TABLET | Freq: Every day | ORAL | 0 refills | Status: DC

## 2018-12-13 NOTE — Telephone Encounter (Signed)
Please call her to schedule 2 week follow-up with Dr. Alain Marion; appointment couldn't be scheduled when she was here.

## 2018-12-13 NOTE — Progress Notes (Signed)
[name] is a 83 y.o. female with the following history as recorded in [employer]:  Patient Active Problem List   Diagnosis Date Noted  . Shingles 09/16/2018  . Coronary atherosclerosis 05/06/2018  . Esophageal dysmotility 01/11/2018  . Weight gain 01/01/2018  . Abdominal bloating 01/01/2018  . Hyperkalemia 06/04/2017  . Grief at loss of child 04/02/2017  . Adenopathy, cervical 11/29/2016  . Dysuria 10/31/2016  . Vertigo 10/31/2016  . Ureteral calculus 09/11/2016  . CAP (community acquired pneumonia) 08/30/2016  . Fatigue 08/24/2016  . Sensation of pressure in bladder area 08/24/2016  . Kidney stone 08/13/2016  . Flank pain 08/10/2016  . Right upper quadrant abdominal tenderness without rebound tenderness 08/10/2016  . Leukocytosis 08/10/2016  . Acute abdomen 08/10/2016  . Intractable nausea and vomiting 08/10/2016  . ARF (acute renal failure) ([place]) 08/10/2016  . Urolithiasis 08/10/2016  . Abdominal pain 08/10/2016  . Anemia 05/19/2016  . Generalized anxiety disorder 01/14/2016  . Dysphagia 04/16/2015  . Dry skin dermatitis 12/28/2014  . Atrophic vaginitis 05/26/2014  . Varicose veins of both lower extremities 12/12/2013  . Restless leg syndrome 03/17/2013  . Impacted cerumen of left ear 03/17/2013  . Osteoarthritis, hand 09/05/2011  . Epistaxis, recurrent 08/17/2011  . Well adult exam 01/31/2011  . Low back pain 01/31/2011  . Palpitations 05/24/2010  . Cerumen impaction 02/23/2010  . PHARYNGITIS, ACUTE 02/23/2010  . SINUSITIS, ACUTE 01/07/2010  . SOB (shortness of breath) 09/23/2009  . BRONCHITIS, ACUTE 08/03/2009  . Osteoarthritis 07/06/2009  . TOBACCO USE, QUIT 03/08/2009  . ALLERGIC RHINITIS 11/06/2008  . NAUSEA ALONE 08/19/2008  . DIARRHEA 08/19/2008  . Essential hypertension 07/15/2008  . EMPHYSEMA 07/15/2008  . PEPTIC ULCER DISEASE 07/15/2008  . ARTHRITIS 07/15/2008  . HEADACHE 05/25/2008  . SYNCOPE 03/23/2008  . WEIGHT LOSS, ABNORMAL 03/23/2008  .  HEARING IMPAIRMENT 12/23/2007  . B12 deficiency 10/14/2007  . Malaise 10/14/2007  . INSOMNIA, PERSISTENT 07/31/2007  . Anxiety state 03/01/2007  . DEPRESSION 03/01/2007  . Atrial fibrillation ([place]) 02/26/2007  . Vitamin D deficiency 02/19/2007  . GERD 02/19/2007  . COPD with chronic bronchitis ([place]) 08/07/2006    Current Outpatient Medications  Medication Sig Dispense Refill  . amLODipine (NORVASC) 5 MG tablet TAKE 1 TABLET(5 MG) BY MOUTH DAILY 90 tablet 1  . Aspirin-Salicylamide-Caffeine (BC HEADACHE POWDER PO) Take 1 packet by mouth daily as needed (headaches).    . Biotin 1000 MCG tablet Take 1,000 mcg by mouth daily.    . Cholecalciferol ([employer]S VITAMIN D3) 1000 UNITS capsule Take 1,000 Units by mouth daily.      . clonazePAM (KLONOPIN) 0.5 MG tablet TAKE 1 TABLET BY MOUTH AT BEDTIME AS NEEDED FOR LEG CRAMPS 30 tablet 3  . cyanocobalamin 1000 MCG tablet Take 1,000 mcg by mouth daily.    . flecainide (TAMBOCOR) 50 MG tablet TAKE 1 TABLET(50 MG) BY MOUTH TWICE DAILY 180 tablet 1  . HYDROcodone-acetaminophen (NORCO) 7.5-325 MG tablet Take 0.5-1 tablets by mouth every 6 (six) hours as needed for severe pain. 20 tablet 0  . metoprolol tartrate (LOPRESSOR) 25 MG tablet TAKE 1/2 TABLET(12.5 MG) BY MOUTH TWICE DAILY 90 tablet 1  . pantoprazole (PROTONIX) 40 MG tablet Take 1 tablet (40 mg total) by mouth daily. 30 tablet 11  . tobramycin (TOBREX) 0.3 % ophthalmic solution     . umeclidinium-vilanterol (ANORO ELLIPTA) 62.5-25 MCG/INH AEPB Inhale 1 puff into the lungs daily. 3 each 3  . valACYclovir (VALTREX) 500 MG tablet Take 2 tablets (1,000  mg total) by mouth 2 (two) times daily. 42 tablet 1  . azithromycin (ZITHROMAX) 250 MG tablet 2 tabs po qd x 1 day; 1 tablet per day x 4 days; 6 tablet 0  . predniSONE (DELTASONE) 20 MG tablet Take 1 tablet (20 mg total) by mouth daily with breakfast. 5 tablet 0   No current facility-administered medications for this visit.     Allergies: Amoxicillin,  Codeine, Levaquin [levofloxacin in d5w], and Other  Past Medical History:  Diagnosis Date  . Anxiety   . Arthritis   . Atrial fibrillation ([place])   . B12 deficiency   . COPD (chronic obstructive pulmonary disease) ([place])   . Depression   . GERD (gastroesophageal reflux disease)   . Hypertension   . Osteoarthrosis, hand    both hands  . Peptic ulcer, unspecified site, unspecified as acute or chronic, without mention of hemorrhage, perforation, or obstruction   . Pneumonia   . PONV (postoperative nausea and vomiting)   . Vitamin D deficiency disease     Past Surgical History:  Procedure Laterality Date  . BLADDER SURGERY     Bladder tack and intestines  . CYSTOCELE REPAIR    . CYSTOSCOPY WITH RETROGRADE PYELOGRAM, URETEROSCOPY AND STENT PLACEMENT Right 09/11/2016   Procedure: CYSTOSCOPY WITH RETROGRADE PYELOGRAM, URETEROSCOPY AND STENT REPLACEMENT;  Surgeon: Cleon Gustin, MD;  Location: [facility];  Service: Urology;  Laterality: Right;  . CYSTOSCOPY WITH STENT PLACEMENT Right 08/11/2016   Procedure: CYSTOSCOPY, URETERSCOPY,  RIGHT RETROGRADE WITH RIGHT STENT PLACEMENT;  Surgeon: Festus Aloe, MD;  Location: [facility];  Service: Urology;  Laterality: Right;  . SPLENECTOMY      Family History  Problem Relation Age of Onset  . Heart disease Father   . Arthritis Other        Family history of  . Coronary artery disease Other        Family history of  . Hypertension Other        Family history of  . Diabetes Daughter        52 died    Social History   Tobacco Use  . Smoking status: Former Smoker    Packs/day: 1.00    Types: Cigarettes    Quit date: 2010    Years since quitting: 10.7  . Smokeless tobacco: Never Used  . Tobacco comment: pt unsure how many years she smoked  Substance Use Topics  . Alcohol use: No    Subjective:  Patient presents with concerns for COPD; feels like she is "not getting enough air;" No wheezing or congestion; no fever;  is adamant that she  does not need a CXR today; Notes that symptoms have been present for a while- seems worse in the past 4 days; no chest pain on exertion; does have history of A. Fib- on Flecanide; currently using Anoro for her COPD- feels like she needs something stronger.     Objective:  Vitals:   12/13/18 1111  BP: 132/76  Pulse: 60  Temp: 97.8 F (36.6 C)  TempSrc: Oral  SpO2: 96%  Weight: 138 lb (62.6 kg)  Height: 5\' 4"  (1.626 m)    General: Well developed, well nourished, in no acute distress  Skin : Warm and dry.  Head: Normocephalic and atraumatic  Eyes: Sclera and conjunctiva clear; pupils round and reactive to light; extraocular movements intact  Ears: External normal; canals clear; tympanic membranes normal  Oropharynx: Pink, supple. No suspicious lesions  Neck: Supple without thyromegaly, adenopathy  Lungs: Respirations unlabored; clear to auscultation bilaterally without wheeze, rales, rhonchi  CVS exam: normal rate and regular rhythm.  Neurologic: Alert and oriented; speech intact; face symmetrical; moves all extremities well; CNII-XII intact without focal deficit   Assessment:  1. Shortness of breath   2. COPD with chronic bronchitis ([place])     Plan:  Physical exam is very reassuring- patient is exhibiting no difficulty breathing in office. EKG does not show acute changes- consistent with previous EKGs; patient refuses CXR today; Change Anoro to Trelegy- use as directed; Rx for Zithromax and Prednisone; follow-up with her PCP in 2 weeks for re-check.   Return in about 2 weeks (around 12/27/2018), or Dr. Alain Marion.  Orders Placed This Encounter  Procedures  . DG Chest 2 View    Standing Status:   Future    Standing Expiration Date:   02/12/2020    Order Specific Question:   Reason for Exam (SYMPTOM  OR DIAGNOSIS REQUIRED)    Answer:   COPD/ shortness of breath    Order Specific Question:   Preferred imaging location?    Answer:   [facility]    Order Specific Question:    Radiology Contrast Protocol - do NOT remove file path    Answer:   [email or web address]  . EKG 12-Lead    Requested Prescriptions   Signed Prescriptions Disp Refills  . predniSONE (DELTASONE) 20 MG tablet 5 tablet 0    Sig: Take 1 tablet (20 mg total) by mouth daily with breakfast.  . azithromycin (ZITHROMAX) 250 MG tablet 6 tablet 0    Sig: 2 tabs po qd x 1 day; 1 tablet per day x 4 days;

## 2018-12-13 NOTE — Telephone Encounter (Signed)
Saw Mickel Baas today

## 2018-12-13 NOTE — Telephone Encounter (Signed)
The patient is to see medical provider.  Thanks

## 2018-12-17 ENCOUNTER — Ambulatory Visit: Payer: Self-pay

## 2018-12-17 DIAGNOSIS — D649 Anemia, unspecified: Secondary | ICD-10-CM

## 2018-12-17 DIAGNOSIS — R5381 Other malaise: Secondary | ICD-10-CM

## 2018-12-17 NOTE — Telephone Encounter (Signed)
Please see 12/13/18 OV note with Beverely Risen., NP. CXR is already ordered. Ok to order labs first and schedule f/u with you?

## 2018-12-17 NOTE — Telephone Encounter (Signed)
Pt. Reports she was seen last Friday. Refused the chest xray and states she " won't take the prednisone." States "my breathing is not better. I think I'm anemic and that my blood count is down." Requests to have the chest xray she refused and "some blood work done." Instructed if symptoms worsen to call 911. Please advise pt.  Answer Assessment - Initial Assessment Questions 1. RESPIRATORY STATUS: "Describe your breathing?" (e.g., wheezing, shortness of breath, unable to speak, severe coughing)      Shortness of breath 2. ONSET: "When did this breathing problem begin?"      1 week ago 3. PATTERN "Does the difficult breathing come and go, or has it been constant since it started?"      Constant 4. SEVERITY: "How bad is your breathing?" (e.g., mild, moderate, severe)    - MILD: No SOB at rest, mild SOB with walking, speaks normally in sentences, can lay down, no retractions, pulse < 100.    - MODERATE: SOB at rest, SOB with minimal exertion and prefers to sit, cannot lie down flat, speaks in phrases, mild retractions, audible wheezing, pulse 100-120.    - SEVERE: Very SOB at rest, speaks in single words, struggling to breathe, sitting hunched forward, retractions, pulse > 120      Moderate 5. RECURRENT SYMPTOM: "Have you had difficulty breathing before?" If so, ask: "When was the last time?" and "What happened that time?"      Yes 6. CARDIAC HISTORY: "Do you have any history of heart disease?" (e.g., heart attack, angina, bypass surgery, angioplasty)      No 7. LUNG HISTORY: "Do you have any history of lung disease?"  (e.g., pulmonary embolus, asthma, emphysema)     Yes 8. CAUSE: "What do you think is causing the breathing problem?"      Unsure 9. OTHER SYMPTOMS: "Do you have any other symptoms? (e.g., dizziness, runny nose, cough, chest pain, fever)     Hurts when she coughs 10. PREGNANCY: "Is there any chance you are pregnant?" "When was your last menstrual period?"       No 11. TRAVEL:  "Have you traveled out of the country in the last month?" (e.g., travel history, exposures)       No  Protocols used: BREATHING DIFFICULTY-A-AH

## 2018-12-18 NOTE — Addendum Note (Signed)
Addended by: Cresenciano Lick on: 12/18/2018 12:06 PM   Modules accepted: Orders

## 2018-12-18 NOTE — Telephone Encounter (Signed)
Left detailed message informing pt of below.  

## 2018-12-18 NOTE — Telephone Encounter (Signed)
Ok CBC, BMET prior to [employer]

## 2018-12-19 ENCOUNTER — Ambulatory Visit (INDEPENDENT_AMBULATORY_CARE_PROVIDER_SITE_OTHER)
Admission: RE | Admit: 2018-12-19 | Discharge: 2018-12-19 | Disposition: A | Discharge status: Home / Self Care | Payer: Medicare Other | Source: Ambulatory Visit | Attending: Family | Admitting: Family

## 2018-12-19 ENCOUNTER — Other Ambulatory Visit: Payer: Self-pay

## 2018-12-19 ENCOUNTER — Other Ambulatory Visit (INDEPENDENT_AMBULATORY_CARE_PROVIDER_SITE_OTHER): Payer: Medicare Other

## 2018-12-19 DIAGNOSIS — R0602 Shortness of breath: Secondary | ICD-10-CM | POA: Diagnosis not present

## 2018-12-19 DIAGNOSIS — R5381 Other malaise: Secondary | ICD-10-CM

## 2018-12-19 DIAGNOSIS — D649 Anemia, unspecified: Secondary | ICD-10-CM

## 2018-12-19 LAB — CBC WITH DIFFERENTIAL/PLATELET
Basophils Absolute: 0.1 10*3/uL (ref 0.0–0.1)
Basophils Relative: 0.9 % (ref 0.0–3.0)
Eosinophils Absolute: 0.6 10*3/uL (ref 0.0–0.7)
Eosinophils Relative: 4.8 % (ref 0.0–5.0)
HCT: 41.2 % (ref 36.0–46.0)
Hemoglobin: 13.4 g/dL (ref 12.0–15.0)
Lymphocytes Relative: 23.4 % (ref 12.0–46.0)
Lymphs Abs: 3.1 10*3/uL (ref 0.7–4.0)
MCHC: 32.4 g/dL (ref 30.0–36.0)
MCV: 97.5 fl (ref 78.0–100.0)
Monocytes Absolute: 1.1 10*3/uL — ABNORMAL HIGH (ref 0.1–1.0)
Monocytes Relative: 8.5 % (ref 3.0–12.0)
Neutro Abs: 8.1 10*3/uL — ABNORMAL HIGH (ref 1.4–7.7)
Neutrophils Relative %: 62.4 % (ref 43.0–77.0)
Platelets: 359 10*3/uL (ref 150.0–400.0)
RBC: 4.23 Mil/uL (ref 3.87–5.11)
RDW: 16.3 % — ABNORMAL HIGH (ref 11.5–15.5)
WBC: 13 10*3/uL — ABNORMAL HIGH (ref 4.0–10.5)

## 2018-12-19 LAB — BASIC METABOLIC PANEL
BUN: 27 mg/dL — ABNORMAL HIGH (ref 6–23)
CO2: 24 mEq/L (ref 19–32)
Calcium: 9.1 mg/dL (ref 8.4–10.5)
Chloride: 107 mEq/L (ref 96–112)
Creatinine, Ser: 1.31 mg/dL — ABNORMAL HIGH (ref 0.40–1.20)
GFR: 38.97 mL/min — ABNORMAL LOW (ref >60.00)
Glucose, Bld: 92 mg/dL (ref 70–99)
Potassium: 4.5 mEq/L (ref 3.5–5.1)
Sodium: 140 mEq/L (ref 135–145)

## 2018-12-19 NOTE — Addendum Note (Signed)
Addended by: Isaiah Serge D on: 12/19/2018 03:00 PM   Modules accepted: Orders

## 2018-12-20 ENCOUNTER — Other Ambulatory Visit: Payer: Self-pay | Admitting: Family

## 2018-12-20 MED ORDER — TRELEGY ELLIPTA 100-62.5-25 MCG/INH IN AEPB: 1.0000 | INHALATION_SPRAY | Freq: Every day | RESPIRATORY_TRACT | 0 refills | Status: DC

## 2018-12-30 ENCOUNTER — Ambulatory Visit: Payer: Medicare Other | Admitting: Internal Medicine

## 2018-12-31 DIAGNOSIS — H353114 Nonexudative age-related macular degeneration, right eye, advanced atrophic with subfoveal involvement: Secondary | ICD-10-CM | POA: Diagnosis not present

## 2018-12-31 DIAGNOSIS — H353221 Exudative age-related macular degeneration, left eye, with active choroidal neovascularization: Secondary | ICD-10-CM | POA: Diagnosis not present

## 2019-01-07 ENCOUNTER — Ambulatory Visit: Payer: Medicare Other | Admitting: Internal Medicine

## 2019-01-09 NOTE — Progress Notes (Signed)
Virtual Visit via telephone Note  I connected with [name] on 01/10/19 at  8:15 AM EST by telephone  and verified that I am speaking with the correct person using two identifiers.   I discussed the limitations of evaluation and management by telemedicine and the availability of in person appointments. The patient expressed understanding and agreed to proceed.  Present for the visit:  Myself, Dr Billey Gosling, [name].  The patient is currently at home and I am in the office.    No referring provider.    History of Present Illness: This is an acute visit for possible thrush.  She has a sore mouth and had thrush once before and wonders if that is what she has now.  It hurts all the time and feels like it is on fire.  It hurts to swallow a little.  She can not eat.  She thinks there is a white coating in her mouth.  Last month she was prescribed a Z-Pak and prednisone, but she states she did not take them.  She does use a steroid-based inhaler daily.  She states she does not rinse out her mouth, but that is not new.   She denies any other concerning symptoms.   Review of Systems  Constitutional: Negative for fever.  HENT: Positive for sore throat. Negative for congestion, ear pain and sinus pain.   Respiratory: Negative for cough, shortness of breath and wheezing.   Cardiovascular: Negative for chest pain.     Social History   Socioeconomic History  . Marital status: Divorced    Spouse name: Not on file  . Number of children: 2  . Years of education: Not on file  . Highest education level: Not on file  Occupational History  . Occupation: Airline pilot: [employer]  . Financial resource strain: Not on file  . Food insecurity    Worry: Not on file    Inability: Not on file  . Transportation needs    Medical: Not on file    Non-medical: Not on file  Tobacco Use  . Smoking status: Former Smoker    Packs/day: 1.00    Types: Cigarettes    Quit date:  2008-06-04    Years since quitting: 10.8  . Smokeless tobacco: Never Used  . Tobacco comment: pt unsure how many years she smoked  Substance and Sexual Activity  . Alcohol use: No  . Drug use: No  . Sexual activity: Not Currently  Lifestyle  . Physical activity    Days per week: Not on file    Minutes per session: Not on file  . Stress: Not on file  Relationships  . Social Herbalist on phone: Not on file    Gets together: Not on file    Attends religious service: Not on file    Active member of club or organization: Not on file    Attends meetings of clubs or organizations: Not on file    Relationship status: Not on file  Other Topics Concern  . Not on file  Social History Narrative   Retired, works part time   Divorced   Former Smoker   1- Son alcoholic abusive   Daughter died in 06-05-06        Assessment and Plan:  See Problem List for Assessment and Plan of chronic medical problems.   Follow Up Instructions:    I discussed the assessment and treatment plan with  the patient. The patient was provided an opportunity to ask questions and all were answered. The patient agreed with the plan and demonstrated an understanding of the instructions.   The patient was advised to call back or seek an in-person evaluation if the symptoms worsen or if the condition fails to improve as anticipated.  Time spent on telephone: 6 minutes  Binnie Rail, MD

## 2019-01-10 ENCOUNTER — Other Ambulatory Visit: Payer: Self-pay

## 2019-01-10 ENCOUNTER — Ambulatory Visit (INDEPENDENT_AMBULATORY_CARE_PROVIDER_SITE_OTHER): Payer: Medicare Other | Admitting: Internal Medicine

## 2019-01-10 ENCOUNTER — Encounter: Payer: Self-pay | Admitting: Internal Medicine

## 2019-01-10 DIAGNOSIS — B370 Candidal stomatitis: Secondary | ICD-10-CM | POA: Diagnosis not present

## 2019-01-10 MED ORDER — NYSTATIN 100000 UNIT/ML MT SUSP: 5.0000 mL | Freq: Four times a day (QID) | OROMUCOSAL | 0 refills | Status: DC

## 2019-01-10 NOTE — Assessment & Plan Note (Signed)
Symptoms consistent with thrush.  It is difficult to make a diagnosis without visualization She is using her steroid-based inhaler daily and has not been rinsing her mouth.  Reviewed that she needs to be rinsing her mouth after each use Start statin swish and spit 4 times a day x10 days Advised her to call if there is no improvement or with any questions

## 2019-01-28 ENCOUNTER — Ambulatory Visit: Payer: Medicare Other | Admitting: Internal Medicine

## 2019-02-05 ENCOUNTER — Ambulatory Visit (INDEPENDENT_AMBULATORY_CARE_PROVIDER_SITE_OTHER): Payer: Medicare Other | Admitting: Internal Medicine

## 2019-02-05 ENCOUNTER — Other Ambulatory Visit: Payer: Self-pay

## 2019-02-05 ENCOUNTER — Encounter: Payer: Self-pay | Admitting: Internal Medicine

## 2019-02-05 DIAGNOSIS — I2510 Atherosclerotic heart disease of native coronary artery without angina pectoris: Secondary | ICD-10-CM

## 2019-02-05 DIAGNOSIS — I10 Essential (primary) hypertension: Secondary | ICD-10-CM | POA: Diagnosis not present

## 2019-02-05 DIAGNOSIS — E559 Vitamin D deficiency, unspecified: Secondary | ICD-10-CM | POA: Diagnosis not present

## 2019-02-05 DIAGNOSIS — L304 Erythema intertrigo: Secondary | ICD-10-CM

## 2019-02-05 DIAGNOSIS — I480 Paroxysmal atrial fibrillation: Secondary | ICD-10-CM | POA: Diagnosis not present

## 2019-02-05 DIAGNOSIS — I2583 Coronary atherosclerosis due to lipid rich plaque: Secondary | ICD-10-CM

## 2019-02-05 MED ORDER — CLONAZEPAM 0.5 MG PO TABS: ORAL_TABLET | ORAL | 0 refills | Status: DC

## 2019-02-05 MED ORDER — PANTOPRAZOLE SODIUM 40 MG PO TBEC: 40.0000 mg | DELAYED_RELEASE_TABLET | Freq: Every day | ORAL | 3 refills | Status: DC

## 2019-02-05 MED ORDER — METOPROLOL TARTRATE 25 MG PO TABS: ORAL_TABLET | ORAL | 3 refills | Status: DC

## 2019-02-05 MED ORDER — FLUCONAZOLE 100 MG PO TABS: ORAL_TABLET | ORAL | 1 refills | Status: DC

## 2019-02-05 MED ORDER — AMLODIPINE BESYLATE 5 MG PO TABS: ORAL_TABLET | ORAL | 3 refills | Status: DC

## 2019-02-05 MED ORDER — KETOCONAZOLE 2 % EX CREA: 1.0000 "application " | TOPICAL_CREAM | Freq: Every day | CUTANEOUS | 1 refills | Status: DC

## 2019-02-05 NOTE — Assessment & Plan Note (Signed)
severe B intertrigo under breasts Diflucan po Ketoconazole cream

## 2019-02-05 NOTE — Progress Notes (Signed)
Subjective:  Patient ID: [name], female    DOB: 05/26/1935  Age: 83 y.o. MRN: [id number]  CC: No chief complaint on file.   HPI [name] presents for anxiety, HTN, COPD f/u C/o bad rash under breasts x 1 month - painful  Outpatient Medications Prior to Visit  Medication Sig Dispense Refill  . amLODipine (NORVASC) 5 MG tablet TAKE 1 TABLET(5 MG) BY MOUTH DAILY 90 tablet 1  . Aspirin-Salicylamide-Caffeine (BC HEADACHE POWDER PO) Take 1 packet by mouth daily as needed (headaches).    . Biotin 1000 MCG tablet Take 1,000 mcg by mouth daily.    . Cholecalciferol ([employer]S VITAMIN D3) 1000 UNITS capsule Take 1,000 Units by mouth daily.      . clonazePAM (KLONOPIN) 0.5 MG tablet TAKE 1 TABLET BY MOUTH AT BEDTIME AS NEEDED FOR LEG CRAMPS 30 tablet 3  . cyanocobalamin 1000 MCG tablet Take 1,000 mcg by mouth daily.    . flecainide (TAMBOCOR) 50 MG tablet TAKE 1 TABLET(50 MG) BY MOUTH TWICE DAILY 180 tablet 1  . Fluticasone-Umeclidin-Vilant (TRELEGY ELLIPTA) 100-62.5-25 MCG/INH AEPB Inhale 1 puff into the lungs daily. 28 each 0  . HYDROcodone-acetaminophen (NORCO) 7.5-325 MG tablet Take 0.5-1 tablets by mouth every 6 (six) hours as needed for severe pain. 20 tablet 0  . metoprolol tartrate (LOPRESSOR) 25 MG tablet TAKE 1/2 TABLET(12.5 MG) BY MOUTH TWICE DAILY 90 tablet 1  . nystatin (MYCOSTATIN) 100000 UNIT/ML suspension Take 5 mLs (500,000 Units total) by mouth 4 (four) times daily. Use for 10 days 200 mL 0  . pantoprazole (PROTONIX) 40 MG tablet Take 1 tablet (40 mg total) by mouth daily. 30 tablet 11  . tobramycin (TOBREX) 0.3 % ophthalmic solution     . umeclidinium-vilanterol (ANORO ELLIPTA) 62.5-25 MCG/INH AEPB Inhale 1 puff into the lungs daily. 3 each 3  . valACYclovir (VALTREX) 500 MG tablet Take 2 tablets (1,000 mg total) by mouth 2 (two) times daily. 42 tablet 1   No facility-administered medications prior to visit.     ROS: Review of Systems  Constitutional: Positive for  fatigue. Negative for activity change, appetite change, chills and unexpected weight change.  HENT: Negative for congestion, mouth sores and sinus pressure.   Eyes: Negative for visual disturbance.  Respiratory: Negative for cough and chest tightness.   Gastrointestinal: Negative for abdominal pain and nausea.  Genitourinary: Negative for difficulty urinating, frequency and vaginal pain.  Musculoskeletal: Positive for arthralgias and back pain. Negative for gait problem.  Skin: Negative for pallor and rash.  Neurological: Negative for dizziness, tremors, weakness, numbness and headaches.  Psychiatric/Behavioral: Negative for confusion, sleep disturbance and suicidal ideas. The patient is nervous/anxious.     Objective:  BP 134/74 (BP Location: Left Arm, Patient Position: Sitting, Cuff Size: Normal)   Pulse 67   Temp 98.2 F (36.8 C) (Oral)   Ht 5\' 4"  (1.626 m)   Wt 138 lb (62.6 kg)   SpO2 98%   BMI 23.69 kg/m   BP Readings from Last 3 Encounters:  02/05/19 134/74  12/13/18 132/76  10/28/18 (!) 148/80    Wt Readings from Last 3 Encounters:  02/05/19 138 lb (62.6 kg)  12/13/18 138 lb (62.6 kg)  10/28/18 139 lb (63 kg)    Physical Exam Constitutional:      General: She is not in acute distress.    Appearance: She is well-developed.  HENT:     Head: Normocephalic.     Right Ear: External ear normal.  Left Ear: External ear normal.     Nose: Nose normal.  Eyes:     General:        Right eye: No discharge.        Left eye: No discharge.     Conjunctiva/sclera: Conjunctivae normal.     Pupils: Pupils are equal, round, and reactive to light.  Neck:     Musculoskeletal: Normal range of motion and neck supple.     Thyroid: No thyromegaly.     Vascular: No JVD.     Trachea: No tracheal deviation.  Cardiovascular:     Rate and Rhythm: Normal rate and regular rhythm.     Heart sounds: Normal heart sounds.  Pulmonary:     Effort: No respiratory distress.     Breath  sounds: No stridor. No wheezing.  Abdominal:     General: Bowel sounds are normal. There is no distension.     Palpations: Abdomen is soft. There is no mass.     Tenderness: There is no abdominal tenderness. There is no guarding or rebound.  Musculoskeletal:        General: Tenderness present.  Lymphadenopathy:     Cervical: No cervical adenopathy.  Skin:    Findings: No erythema or rash.  Neurological:     Mental Status: She is oriented to person, place, and time.     Cranial Nerves: No cranial nerve deficit.     Motor: No abnormal muscle tone.     Coordination: Coordination normal.     Deep Tendon Reflexes: Reflexes normal.  Psychiatric:        Behavior: Behavior normal.        Thought Content: Thought content normal.        Judgment: Judgment normal.   severe B intertrigo rash under breasts  Lab Results  Component Value Date   WBC 13.0 (H) 12/19/2018   HGB 13.4 12/19/2018   HCT 41.2 12/19/2018   PLT 359.0 12/19/2018   GLUCOSE 92 12/19/2018   CHOL 199 03/21/2013   TRIG 107.0 03/21/2013   HDL 57.50 03/21/2013   LDLDIRECT 144.3 01/31/2011   LDLCALC 120 (H) 03/21/2013   ALT 9 01/01/2018   AST 17 01/01/2018   NA 140 12/19/2018   K 4.5 12/19/2018   CL 107 12/19/2018   CREATININE 1.31 (H) 12/19/2018   BUN 27 (H) 12/19/2018   CO2 24 12/19/2018   TSH 1.91 01/01/2018   INR 0.9 08/13/2008    Dg Chest 2 View  Result Date: 12/20/2018 CLINICAL DATA:  Shortness of breath, COPD EXAM: CHEST - 2 VIEW COMPARISON:  Radiograph 04/29/2018, CT 01/01/2017 FINDINGS: Coarse interstitial changes with hyperinflation of the lungs and biapical lucency compatible with features of COPD and emphysema better seen on prior cross-sectional imaging. There is stable biapical pleuroparenchymal scarring as well. No acute consolidative process. No features of edema. No pneumothorax or effusion. The aorta is calcified. The remaining cardiomediastinal contours are unremarkable. Degenerative changes are  present in the imaged spine and shoulders. Surgical clips are present in the left upper quadrant. IMPRESSION: 1. Findings consistent with COPD and emphysema. 2. No acute cardiopulmonary disease. 3. Unchanged biapical scarring. 4.  Aortic Atherosclerosis (ICD10-I70.0). Electronically Signed   By: Lovena Le M.D.   On: 12/20/2018 03:52    Assessment & Plan:   There are no diagnoses linked to this encounter.   No orders of the defined types were placed in this encounter.    Follow-up: No follow-ups on file.  Alex Robbie Rideaux,  MD

## 2019-02-05 NOTE — Assessment & Plan Note (Signed)
On Lopressor, ASA, Flecainide, Toprol  Pt refused anticoagulation

## 2019-02-05 NOTE — Assessment & Plan Note (Signed)
On Lopressor, Norvasc

## 2019-02-05 NOTE — Assessment & Plan Note (Signed)
Vit D 

## 2019-02-05 NOTE — Assessment & Plan Note (Signed)
On Lopressor, Flecainide, ASA, Toprol Pt refused anticoagulation

## 2019-02-05 NOTE — Patient Instructions (Signed)
Intertrigo Intertrigo is skin irritation (inflammation) that happens in warm, moist areas of the body. The irritation can cause a rash and make skin raw and itchy. The rash is usually pink or red. It happens mostly between folds of skin or where skin rubs together, such as:  Between the toes.  In the armpits.  In the groin area.  Under the belly.  Under the breasts.  Around the butt area. This condition is not passed from person to person (is not contagious). What are the causes?  Heat, moisture, rubbing, and not enough air movement.  The condition can be made worse by: ? Sweat. ? Bacteria. ? A fungus, such as yeast. What increases the risk?  Moisture in your skin folds.  You are more likely to develop this condition if you: ? Have diabetes. ? Are overweight. ? Are not able to move around. ? Live in a warm and moist climate. ? Wear splints, braces, or other medical devices. ? Are not able to control your pee (urine) or poop (stool). What are the signs or symptoms?  A pink or red skin rash in the skin fold or near the skin fold.  Raw or scaly skin.  Itching.  A burning feeling.  Bleeding.  Leaking fluid.  A bad smell. How is this treated?  Cleaning and drying your skin.  Taking an antibiotic medicine or using an antibiotic skin cream for a bacterial infection.  Using an antifungal cream on your skin or taking pills for an infection that was caused by a fungus, such as yeast.  Using a steroid ointment to stop the itching and irritation.  Separating the skin fold with a clean cotton cloth to absorb moisture and allow air to flow into the area. Follow these instructions at home:  Keep the affected area clean and dry.  Do not scratch your skin.  Stay cool as much as you can. Use an air conditioner or a fan, if you have one.  Apply over-the-counter and prescription medicines only as told by your doctor.  If you were prescribed an antibiotic medicine,  use it as told by your doctor. Do not stop using the antibiotic even if your condition starts to get better.  Keep all follow-up visits as told by your doctor. This is important. How is this prevented?   Stay at a healthy weight.  Take care of your feet. This is very important if you have diabetes. You should: ? Wear shoes that fit well. ? Keep your feet dry. ? Wear clean cotton or wool socks.  Protect the skin in your groin and butt area as told by your doctor. To do this: ? Follow a regular cleaning routine. ? Use creams, powders, or ointments that protect your skin. ? Change protection pads often.  Do not wear tight clothes. Wear clothes that: ? Are loose. ? Take moisture away from your body. ? Are made of cotton.  Wear a bra that gives good support, if needed.  Shower and dry yourself well after being active. Use a hair dryer on a cool setting to dry between skin folds.  Keep your blood sugar under control if you have diabetes. Contact a doctor if:  Your symptoms do not get better with treatment.  Your symptoms get worse or they spread.  You notice more redness and warmth.  You have a fever. Summary  Intertrigo is skin irritation that occurs when folds of skin rub together.  This condition is caused by heat, moisture,   and rubbing.  This condition may be treated by cleaning and drying your skin and with medicines.  Apply over-the-counter and prescription medicines only as told by your doctor.  Keep all follow-up visits as told by your doctor. This is important. This information is not intended to replace advice given to you by your health care provider. Make sure you discuss any questions you have with your health care provider. Document Released: 03/25/2010 Document Revised: 11/29/2017 Document Reviewed: 11/29/2017 Elsevier Patient Education  2020 [employer]  

## 2019-02-11 ENCOUNTER — Ambulatory Visit: Payer: Self-pay

## 2019-02-11 NOTE — Telephone Encounter (Signed)
Patient originally called reporting.  That she thinks she is having a reaction from taking the medication  For UTI, Reported to Agent that she had sore in her mouth tightness in chest.  SOB.  Once call was transferred to me Patient stated that she didn't feel like answer questions and hung up. Will transferTE  to office.

## 2019-02-11 NOTE — Telephone Encounter (Signed)
Please advise 

## 2019-02-12 NOTE — Telephone Encounter (Signed)
I did not give her a medicine for a UTI. What is it? A diflucan? Is the rash better? Thx

## 2019-02-13 ENCOUNTER — Encounter: Payer: Self-pay | Admitting: Internal Medicine

## 2019-02-13 NOTE — Telephone Encounter (Signed)
error:[id number] ° °

## 2019-02-14 NOTE — Telephone Encounter (Signed)
The medication was for severe B intertrigo under breasts was taking Diflucan po and Ketoconazole cream. She states it is burning and itching.   She states she took 10 of the 11 tablets.

## 2019-02-15 NOTE — Telephone Encounter (Signed)
Use over-the-counter hydrocortisone cream 2- 3 times a day in addition to antifungal cream and oral medication Office visit on Monday if not better.  Thanks

## 2019-02-17 NOTE — Telephone Encounter (Signed)
Pt states she is still sore but it looks a lot better.

## 2019-02-18 DIAGNOSIS — H353114 Nonexudative age-related macular degeneration, right eye, advanced atrophic with subfoveal involvement: Secondary | ICD-10-CM | POA: Diagnosis not present

## 2019-02-18 DIAGNOSIS — H35371 Puckering of macula, right eye: Secondary | ICD-10-CM | POA: Diagnosis not present

## 2019-02-18 DIAGNOSIS — H353221 Exudative age-related macular degeneration, left eye, with active choroidal neovascularization: Secondary | ICD-10-CM | POA: Diagnosis not present

## 2019-02-18 DIAGNOSIS — H43812 Vitreous degeneration, left eye: Secondary | ICD-10-CM | POA: Diagnosis not present

## 2019-03-04 ENCOUNTER — Ambulatory Visit: Payer: Self-pay | Admitting: *Deleted

## 2019-03-04 NOTE — Telephone Encounter (Signed)
LM notifying pt that she needs an office visit in order to get any imaging

## 2019-03-04 NOTE — Telephone Encounter (Signed)
Patient is having head pain from fall. Patient is calling to report she fell 3 weeks ago and hit her head very hard- she has had headache since. She was dizzy when she went to the store yesterday. Advised ED- but patient declines. (Patient states she is much better under the breast- the sores PCP treated are gone).  Patient states she did get bruised on arm and breast when she fell. She states she does not want to come to the office because PCP can not tell her why she is having headache. She wants scan.  Reason for Disposition . [1] SEVERE headache AND [2] not improved 2 hours after pain medicine/ice packs  Answer Assessment - Initial Assessment Questions 1. MECHANISM: "How did the injury happen?" For falls, ask: "What height did you fall from?" and "What surface did you fall against?"      Patient went to step on stool and fell. Patient hit head of stool and cement 2. ONSET: "When did the injury happen?" (Minutes or hours ago)      3 weeks 3. NEUROLOGIC SYMPTOMS: "Was there any loss of consciousness?" "Are there any other neurological symptoms?"      No loss of consciousness- dizzy,headache, feels faint at times 4. MENTAL STATUS: "Does the person know who he is, who you are, and where he is?"      Alert and oriented 5. LOCATION: "What part of the head was hit?"      Right side of head above the ear 6. SCALP APPEARANCE: "What does the scalp look like? Is it bleeding now?" If so, ask: "Is it difficult to stop?"     No swelling - it is painful where hit head 7. SIZE: For cuts, bruises, or swelling, ask: "How large is it?" (e.g., inches or centimeters)      No swelling 8. PAIN: "Is there any pain?" If so, ask: "How bad is it?"  (e.g., Scale 1-10; or mild, moderate, severe)     Headache- constant since the fall, does not stop- makes her nauseous 9. TETANUS: For any breaks in the skin, ask: "When was the last tetanus booster?"     n/a 10. OTHER SYMPTOMS: "Do you have any other symptoms?" (e.g.,  neck pain, vomiting)       no 11. PREGNANCY: "Is there any chance you are pregnant?" "When was your last menstrual period?"       n/a  Protocols used: HEAD INJURY-A-AH

## 2019-03-27 ENCOUNTER — Telehealth: Payer: Self-pay | Admitting: Cardiology

## 2019-03-27 ENCOUNTER — Other Ambulatory Visit: Payer: Self-pay

## 2019-03-27 MED ORDER — FLECAINIDE ACETATE 50 MG PO TABS: ORAL_TABLET | ORAL | 2 refills | Status: DC

## 2019-03-27 NOTE — Telephone Encounter (Signed)
 *  STAT* If patient is at the pharmacy, call can be transferred to refill team.   1. Which medications need to be refilled? (please list name of each medication and dose if known) flecainide (TAMBOCOR) 50 MG tablet  2. Which pharmacy/location (including street and city if local pharmacy) is medication to be sent to? [facility], [place] - [address] AT [place]  3. Do they need a 30 day or 90 day supply? 90 days  Patient only has one dose left

## 2019-03-27 NOTE — Telephone Encounter (Signed)
Medication send to Pharmacy for refill.

## 2019-03-28 ENCOUNTER — Other Ambulatory Visit: Payer: Self-pay

## 2019-03-28 MED ORDER — FLECAINIDE ACETATE 50 MG PO TABS: ORAL_TABLET | ORAL | 1 refills | Status: DC

## 2019-05-06 ENCOUNTER — Encounter: Payer: Self-pay | Admitting: Internal Medicine

## 2019-05-06 ENCOUNTER — Other Ambulatory Visit: Payer: Self-pay

## 2019-05-06 ENCOUNTER — Ambulatory Visit (INDEPENDENT_AMBULATORY_CARE_PROVIDER_SITE_OTHER): Payer: Medicare Other | Admitting: Internal Medicine

## 2019-05-06 VITALS — BP 132/76 | HR 60 | Temp 98.2°F | Ht 64.0 in | Wt 133.0 lb

## 2019-05-06 DIAGNOSIS — I10 Essential (primary) hypertension: Secondary | ICD-10-CM | POA: Diagnosis not present

## 2019-05-06 DIAGNOSIS — E559 Vitamin D deficiency, unspecified: Secondary | ICD-10-CM | POA: Diagnosis not present

## 2019-05-06 DIAGNOSIS — F411 Generalized anxiety disorder: Secondary | ICD-10-CM

## 2019-05-06 DIAGNOSIS — J449 Chronic obstructive pulmonary disease, unspecified: Secondary | ICD-10-CM | POA: Diagnosis not present

## 2019-05-06 DIAGNOSIS — I480 Paroxysmal atrial fibrillation: Secondary | ICD-10-CM

## 2019-05-06 DIAGNOSIS — E538 Deficiency of other specified B group vitamins: Secondary | ICD-10-CM

## 2019-05-06 LAB — BASIC METABOLIC PANEL
BUN: 33 mg/dL — ABNORMAL HIGH (ref 6–23)
CO2: 26 mEq/L (ref 19–32)
Calcium: 9.5 mg/dL (ref 8.4–10.5)
Chloride: 106 mEq/L (ref 96–112)
Creatinine, Ser: 2.31 mg/dL — ABNORMAL HIGH (ref 0.40–1.20)
GFR: 20.23 mL/min — ABNORMAL LOW (ref >60.00)
Glucose, Bld: 94 mg/dL (ref 70–99)
Potassium: 4.6 mEq/L (ref 3.5–5.1)
Sodium: 142 mEq/L (ref 135–145)

## 2019-05-06 MED ORDER — TRELEGY ELLIPTA 100-62.5-25 MCG/INH IN AEPB: 1.0000 | INHALATION_SPRAY | Freq: Every day | RESPIRATORY_TRACT | 3 refills | Status: DC

## 2019-05-06 NOTE — Assessment & Plan Note (Signed)
Refused blood thinners

## 2019-05-06 NOTE — Assessment & Plan Note (Signed)
TRELEGY

## 2019-05-06 NOTE — Assessment & Plan Note (Signed)
On B12 

## 2019-05-06 NOTE — Assessment & Plan Note (Signed)
Clonazepam prn  Potential benefits of a long term benzodiazepines  use as well as potential risks  and complications were explained to the patient and were aknowledged.  

## 2019-05-06 NOTE — Assessment & Plan Note (Signed)
Vit D 

## 2019-05-06 NOTE — Progress Notes (Signed)
Subjective:  Patient ID: [name], female    DOB: 10-28-1935  Age: 84 y.o. MRN: [id number]  CC: No chief complaint on file.   HPI [name] presents for COPD - Trelegy worked better C/o wt loss - she destroyed her bottom  teeth  Outpatient Medications Prior to Visit  Medication Sig Dispense Refill  . amLODipine (NORVASC) 5 MG tablet TAKE 1 TABLET(5 MG) BY MOUTH DAILY 90 tablet 3  . Aspirin-Salicylamide-Caffeine (BC HEADACHE POWDER PO) Take 1 packet by mouth daily as needed (headaches).    . Biotin 1000 MCG tablet Take 1,000 mcg by mouth daily.    . Cholecalciferol ([facility] VITAMIN D3) 1000 UNITS capsule Take 1,000 Units by mouth daily.      . clonazePAM (KLONOPIN) 0.5 MG tablet TAKE 1 TABLET BY MOUTH AT BEDTIME AS NEEDED FOR LEG CRAMPS 90 tablet 0  . cyanocobalamin 1000 MCG tablet Take 1,000 mcg by mouth daily.    . flecainide (TAMBOCOR) 50 MG tablet TAKE 1 TABLET(50 MG) BY MOUTH TWICE DAILY 180 tablet 1  . HYDROcodone-acetaminophen (NORCO) 7.5-325 MG tablet Take 0.5-1 tablets by mouth every 6 (six) hours as needed for severe pain. 20 tablet 0  . ketoconazole (NIZORAL) 2 % cream Apply 1 application topically daily. 45 g 1  . metoprolol tartrate (LOPRESSOR) 25 MG tablet TAKE 1/2 TABLET(12.5 MG) BY MOUTH TWICE DAILY 90 tablet 3  . nystatin (MYCOSTATIN) 100000 UNIT/ML suspension Take 5 mLs (500,000 Units total) by mouth 4 (four) times daily. Use for 10 days 200 mL 0  . pantoprazole (PROTONIX) 40 MG tablet Take 1 tablet (40 mg total) by mouth daily. 90 tablet 3  . tobramycin (TOBREX) 0.3 % ophthalmic solution     . valACYclovir (VALTREX) 500 MG tablet Take 2 tablets (1,000 mg total) by mouth 2 (two) times daily. 42 tablet 1  . umeclidinium-vilanterol (ANORO ELLIPTA) 62.5-25 MCG/INH AEPB Inhale 1 puff into the lungs daily. 3 each 3  . fluconazole (DIFLUCAN) 100 MG tablet Take 2 tabs on day#1, then 1 tab daily on Days #2-10 11 tablet 1   No facility-administered medications prior to  visit.    ROS: Review of Systems  Constitutional: Negative for activity change, appetite change, chills, fatigue and unexpected weight change.  HENT: Negative for congestion, mouth sores and sinus pressure.   Eyes: Negative for visual disturbance.  Respiratory: Positive for chest tightness and shortness of breath. Negative for cough.   Gastrointestinal: Negative for abdominal pain and nausea.  Genitourinary: Negative for difficulty urinating, frequency and vaginal pain.  Musculoskeletal: Positive for arthralgias. Negative for back pain and gait problem.  Skin: Negative for pallor and rash.  Neurological: Negative for dizziness, tremors, weakness, numbness and headaches.  Psychiatric/Behavioral: Negative for confusion, sleep disturbance and suicidal ideas.    Objective:  BP 132/76 (BP Location: Left Arm, Patient Position: Sitting, Cuff Size: Normal)   Pulse 60   Temp 98.2 F (36.8 C) (Oral)   Ht 5\' 4"  (1.626 m)   Wt 133 lb (60.3 kg)   SpO2 94%   BMI 22.83 kg/m   BP Readings from Last 3 Encounters:  05/06/19 132/76  02/05/19 134/74  12/13/18 132/76    Wt Readings from Last 3 Encounters:  05/06/19 133 lb (60.3 kg)  02/05/19 138 lb (62.6 kg)  12/13/18 138 lb (62.6 kg)    Physical Exam Constitutional:      General: She is not in acute distress.    Appearance: She is well-developed.  HENT:  Head: Normocephalic.     Right Ear: External ear normal.     Left Ear: External ear normal.     Nose: Nose normal.  Eyes:     General:        Right eye: No discharge.        Left eye: No discharge.     Conjunctiva/sclera: Conjunctivae normal.     Pupils: Pupils are equal, round, and reactive to light.  Neck:     Thyroid: No thyromegaly.     Vascular: No JVD.     Trachea: No tracheal deviation.  Cardiovascular:     Rate and Rhythm: Normal rate and regular rhythm.     Heart sounds: Normal heart sounds.  Pulmonary:     Effort: No respiratory distress.     Breath sounds: No  stridor. No wheezing.  Abdominal:     General: Bowel sounds are normal. There is no distension.     Palpations: Abdomen is soft. There is no mass.     Tenderness: There is no abdominal tenderness. There is no guarding or rebound.  Musculoskeletal:        General: No tenderness.     Cervical back: Normal range of motion and neck supple.  Lymphadenopathy:     Cervical: No cervical adenopathy.  Skin:    Findings: No erythema or rash.  Neurological:     Cranial Nerves: No cranial nerve deficit.     Motor: No abnormal muscle tone.     Coordination: Coordination normal.     Deep Tendon Reflexes: Reflexes normal.  Psychiatric:        Behavior: Behavior normal.        Thought Content: Thought content normal.        Judgment: Judgment normal.     Lab Results  Component Value Date   WBC 13.0 (H) 12/19/2018   HGB 13.4 12/19/2018   HCT 41.2 12/19/2018   PLT 359.0 12/19/2018   GLUCOSE 92 12/19/2018   CHOL 199 03/21/2013   TRIG 107.0 03/21/2013   HDL 57.50 03/21/2013   LDLDIRECT 144.3 01/31/2011   LDLCALC 120 (H) 03/21/2013   ALT 9 01/01/2018   AST 17 01/01/2018   NA 140 12/19/2018   K 4.5 12/19/2018   CL 107 12/19/2018   CREATININE 1.31 (H) 12/19/2018   BUN 27 (H) 12/19/2018   CO2 24 12/19/2018   TSH 1.91 01/01/2018   INR 0.9 08/13/2008    DG Chest 2 View  Result Date: 12/20/2018 CLINICAL DATA:  Shortness of breath, COPD EXAM: CHEST - 2 VIEW COMPARISON:  Radiograph 04/29/2018, CT 01/01/2017 FINDINGS: Coarse interstitial changes with hyperinflation of the lungs and biapical lucency compatible with features of COPD and emphysema better seen on prior cross-sectional imaging. There is stable biapical pleuroparenchymal scarring as well. No acute consolidative process. No features of edema. No pneumothorax or effusion. The aorta is calcified. The remaining cardiomediastinal contours are unremarkable. Degenerative changes are present in the imaged spine and shoulders. Surgical clips  are present in the left upper quadrant. IMPRESSION: 1. Findings consistent with COPD and emphysema. 2. No acute cardiopulmonary disease. 3. Unchanged biapical scarring. 4.  Aortic Atherosclerosis (ICD10-I70.0). Electronically Signed   By: Lovena Le M.D.   On: 12/20/2018 03:52    Assessment & Plan:   There are no diagnoses linked to this encounter.   Meds ordered this encounter  Medications  . Fluticasone-Umeclidin-Vilant (TRELEGY ELLIPTA) 100-62.5-25 MCG/INH AEPB    Sig: Inhale 1 Inhaler into the lungs daily.  Dispense:  3 each    Refill:  3     Follow-up: No follow-ups on file.  Walker Kehr, MD

## 2019-05-06 NOTE — Addendum Note (Signed)
Addended by: Isaiah Serge D on: 05/06/2019 11:42 AM   Modules accepted: Orders

## 2019-05-08 ENCOUNTER — Ambulatory Visit: Payer: Medicare Other | Attending: Internal Medicine

## 2019-05-09 ENCOUNTER — Telehealth: Payer: Self-pay

## 2019-05-09 NOTE — Telephone Encounter (Signed)
New message    The patient calling asking the nurse to call her back to repeat her blood work results.

## 2019-05-09 NOTE — Telephone Encounter (Signed)
Pt re-notified of lab results

## 2019-05-20 DIAGNOSIS — H353114 Nonexudative age-related macular degeneration, right eye, advanced atrophic with subfoveal involvement: Secondary | ICD-10-CM | POA: Diagnosis not present

## 2019-05-20 DIAGNOSIS — H35371 Puckering of macula, right eye: Secondary | ICD-10-CM | POA: Diagnosis not present

## 2019-05-20 DIAGNOSIS — H353221 Exudative age-related macular degeneration, left eye, with active choroidal neovascularization: Secondary | ICD-10-CM | POA: Diagnosis not present

## 2019-05-20 DIAGNOSIS — H43812 Vitreous degeneration, left eye: Secondary | ICD-10-CM | POA: Diagnosis not present

## 2019-05-21 ENCOUNTER — Telehealth: Payer: Self-pay

## 2019-05-21 NOTE — Telephone Encounter (Signed)
Medication Refill - Medication: ketoconazole (NIZORAL) 2 % cream   Has the patient contacted their pharmacy? Yes.   (Agent: If no, request that the patient contact the pharmacy for the refill.) (Agent: If yes, when and what did the pharmacy advise?)  Preferred Pharmacy (with phone number or street name): [facility], [place]: Please be advised that RX refills may take up to 3 business days. We ask that you follow-up with your pharmacy.

## 2019-05-22 ENCOUNTER — Other Ambulatory Visit: Payer: Self-pay | Admitting: Internal Medicine

## 2019-05-23 MED ORDER — KETOCONAZOLE 2 % EX CREA: 1.0000 "application " | TOPICAL_CREAM | Freq: Every day | CUTANEOUS | 1 refills | Status: DC

## 2019-05-23 NOTE — Telephone Encounter (Signed)
Okay.  Thanks.

## 2019-05-25 DIAGNOSIS — I10 Essential (primary) hypertension: Secondary | ICD-10-CM | POA: Diagnosis not present

## 2019-05-25 DIAGNOSIS — H6123 Impacted cerumen, bilateral: Secondary | ICD-10-CM | POA: Diagnosis not present

## 2019-05-31 ENCOUNTER — Ambulatory Visit: Payer: Medicare Other

## 2019-06-09 DIAGNOSIS — H353112 Nonexudative age-related macular degeneration, right eye, intermediate dry stage: Secondary | ICD-10-CM | POA: Diagnosis not present

## 2019-06-09 DIAGNOSIS — H16223 Keratoconjunctivitis sicca, not specified as Sjogren's, bilateral: Secondary | ICD-10-CM | POA: Diagnosis not present

## 2019-06-09 DIAGNOSIS — H353220 Exudative age-related macular degeneration, left eye, stage unspecified: Secondary | ICD-10-CM | POA: Diagnosis not present

## 2019-06-09 DIAGNOSIS — H26491 Other secondary cataract, right eye: Secondary | ICD-10-CM | POA: Diagnosis not present

## 2019-06-09 DIAGNOSIS — Z961 Presence of intraocular lens: Secondary | ICD-10-CM | POA: Diagnosis not present

## 2019-06-17 ENCOUNTER — Telehealth: Payer: Self-pay | Admitting: Internal Medicine

## 2019-06-17 NOTE — Telephone Encounter (Signed)
Please advise 

## 2019-06-17 NOTE — Telephone Encounter (Signed)
New message:   Pt states she has thrush in her mouth and needs some more magic mouthwash. Pt would like this prescription to be sent to [facility], [place]

## 2019-06-18 NOTE — Telephone Encounter (Signed)
Okay to refill.  Thanks.

## 2019-06-19 MED ORDER — NYSTATIN 100000 UNIT/ML MT SUSP: 5.0000 mL | Freq: Four times a day (QID) | OROMUCOSAL | 0 refills | Status: DC

## 2019-06-19 NOTE — Telephone Encounter (Signed)
RX sent

## 2019-06-23 ENCOUNTER — Telehealth: Payer: Self-pay

## 2019-06-23 NOTE — Telephone Encounter (Signed)
Please advise 

## 2019-06-23 NOTE — Telephone Encounter (Signed)
New message   The patient wants to cancel out one of her blood pressure medications amLODipine (NORVASC) 5 MG tablet.  The patient does not know why she on two blood pressure medications.

## 2019-06-24 NOTE — Telephone Encounter (Signed)
She is on 2 blood pressure medicines because it takes 2 blood pressure medicines to control her blood pressure. Thanks

## 2019-06-25 NOTE — Telephone Encounter (Signed)
Pt.notified

## 2019-07-01 ENCOUNTER — Telehealth: Payer: Self-pay | Admitting: Cardiology

## 2019-07-01 NOTE — Telephone Encounter (Signed)
Called and spoke with patient. Advised that she will be fine. She should be fine to take tonight's dose. Asked how she accidentally took twice. She said she took them out of her med box, but didn't take them right away bc she hates taking medications. Then she forgot if she took.  Advised that in the future she needs to take her medications right away after she takes them out of medbox.

## 2019-07-01 NOTE — Telephone Encounter (Signed)
  Pt c/o medication issue:  1. Name of Medication: flecainide (TAMBOCOR) 50 MG tablet  2. How are you currently taking this medication (dosage and times per day)? TAKE 1 TABLET(50 MG) BY MOUTH TWICE DAILY  3. Are you having a reaction (difficulty breathing--STAT)?  No  4. What is your medication issue?   Patient has discovered that she accidentally took the flecainide (TAMBOCOR) 50 MG tablet twice this morning and she is calling to see if that will be okay or if she should be concerned.

## 2019-07-09 ENCOUNTER — Telehealth: Payer: Self-pay

## 2019-07-09 NOTE — Telephone Encounter (Signed)
New message    The patient calling asking for magic mouthwash instead of the medication that was prescribe to her.   The patient threw away her bottle and does not know the name of the prescription.   [place], [place] AT [place]

## 2019-07-10 MED ORDER — MAGIC MOUTHWASH W/LIDOCAINE: 5.0000 mL | Freq: Four times a day (QID) | ORAL | 0 refills | Status: DC | PRN

## 2019-07-10 NOTE — Telephone Encounter (Signed)
RX sent

## 2019-07-23 ENCOUNTER — Ambulatory Visit: Payer: Medicare Other | Admitting: Internal Medicine

## 2019-07-23 ENCOUNTER — Other Ambulatory Visit: Payer: Self-pay

## 2019-07-23 ENCOUNTER — Encounter: Payer: Self-pay | Admitting: Internal Medicine

## 2019-07-23 ENCOUNTER — Ambulatory Visit (INDEPENDENT_AMBULATORY_CARE_PROVIDER_SITE_OTHER): Payer: Medicare Other | Admitting: Internal Medicine

## 2019-07-23 DIAGNOSIS — F411 Generalized anxiety disorder: Secondary | ICD-10-CM

## 2019-07-23 DIAGNOSIS — J4489 Other specified chronic obstructive pulmonary disease: Secondary | ICD-10-CM

## 2019-07-23 DIAGNOSIS — M25572 Pain in left ankle and joints of left foot: Secondary | ICD-10-CM | POA: Diagnosis not present

## 2019-07-23 DIAGNOSIS — I480 Paroxysmal atrial fibrillation: Secondary | ICD-10-CM

## 2019-07-23 DIAGNOSIS — E538 Deficiency of other specified B group vitamins: Secondary | ICD-10-CM | POA: Diagnosis not present

## 2019-07-23 DIAGNOSIS — J449 Chronic obstructive pulmonary disease, unspecified: Secondary | ICD-10-CM | POA: Diagnosis not present

## 2019-07-23 LAB — BASIC METABOLIC PANEL
BUN: 21 mg/dL (ref 6–23)
CO2: 25 mEq/L (ref 19–32)
Calcium: 8.9 mg/dL (ref 8.4–10.5)
Chloride: 108 mEq/L (ref 96–112)
Creatinine, Ser: 1.99 mg/dL — ABNORMAL HIGH (ref 0.40–1.20)
GFR: 24.02 mL/min — ABNORMAL LOW (ref >60.00)
Glucose, Bld: 92 mg/dL (ref 70–99)
Potassium: 3.9 mEq/L (ref 3.5–5.1)
Sodium: 143 mEq/L (ref 135–145)

## 2019-07-23 LAB — URIC ACID: Uric Acid, Serum: 3.4 mg/dL (ref 2.4–7.0)

## 2019-07-23 MED ORDER — METHYLPREDNISOLONE 4 MG PO TBPK
ORAL_TABLET | ORAL | 0 refills | Status: DC
Start: 2019-07-23 — End: 2019-08-28

## 2019-07-23 NOTE — Assessment & Plan Note (Signed)
On B12 

## 2019-07-23 NOTE — Assessment & Plan Note (Signed)
gout vs other Medrol pack ACE wrap

## 2019-07-23 NOTE — Addendum Note (Signed)
Addended by: Cresenciano Lick on: 07/23/2019 11:32 AM   Modules accepted: Orders

## 2019-07-23 NOTE — Assessment & Plan Note (Addendum)
She will call Dr Stanford Breed for an appt  On Lopressor, Flecainide, ASA

## 2019-07-23 NOTE — Assessment & Plan Note (Signed)
Clonazepam prn  Potential benefits of a long term benzodiazepines  use as well as potential risks  and complications were explained to the patient and were aknowledged.  

## 2019-07-23 NOTE — Patient Instructions (Signed)
Dr Berlinda [facility] [address] [place], [place] [id number] [phone]

## 2019-07-23 NOTE — Assessment & Plan Note (Signed)
Worse Medrol pack

## 2019-07-23 NOTE — Progress Notes (Signed)
Subjective:  Patient ID: [name], female    DOB: 09-10-1935  Age: 84 y.o. MRN: [id number]  CC: No chief complaint on file.   HPI [name] presents for L ankle swelling and pain x 3 d; no injury C/o COPD, SOB -- worse   Outpatient Medications Prior to Visit  Medication Sig Dispense Refill  . amLODipine (NORVASC) 5 MG tablet TAKE 1 TABLET(5 MG) BY MOUTH DAILY 90 tablet 3  . Aspirin-Salicylamide-Caffeine (BC HEADACHE POWDER PO) Take 1 packet by mouth daily as needed (headaches).    . Biotin 1000 MCG tablet Take 1,000 mcg by mouth daily.    . Cholecalciferol ([employer] VITAMIN D3) 1000 UNITS capsule Take 1,000 Units by mouth daily.      . clonazePAM (KLONOPIN) 0.5 MG tablet TAKE 1 TABLET BY MOUTH AT BEDTIME AS NEEDED FOR LEG CRAMPS 90 tablet 0  . cyanocobalamin 1000 MCG tablet Take 1,000 mcg by mouth daily.    . flecainide (TAMBOCOR) 50 MG tablet TAKE 1 TABLET(50 MG) BY MOUTH TWICE DAILY 180 tablet 1  . Fluticasone-Umeclidin-Vilant (TRELEGY ELLIPTA) 100-62.5-25 MCG/INH AEPB Inhale 1 Inhaler into the lungs daily. 3 each 3  . HYDROcodone-acetaminophen (NORCO) 7.5-325 MG tablet Take 0.5-1 tablets by mouth every 6 (six) hours as needed for severe pain. 20 tablet 0  . ketoconazole (NIZORAL) 2 % cream Apply 1 application topically daily. 45 g 1  . metoprolol tartrate (LOPRESSOR) 25 MG tablet TAKE 1/2 TABLET(12.5 MG) BY MOUTH TWICE DAILY 90 tablet 3  . pantoprazole (PROTONIX) 40 MG tablet Take 1 tablet (40 mg total) by mouth daily. 90 tablet 3  . tobramycin (TOBREX) 0.3 % ophthalmic solution     . valACYclovir (VALTREX) 500 MG tablet Take 2 tablets (1,000 mg total) by mouth 2 (two) times daily. 42 tablet 1  . magic mouthwash w/lidocaine SOLN Take 5 mLs by mouth 4 (four) times daily as needed for mouth pain. (Patient not taking: Reported on 07/23/2019) 240 mL 0  . nystatin (MYCOSTATIN) 100000 UNIT/ML suspension Take 5 mLs (500,000 Units total) by mouth 4 (four) times daily. Use for 10 days 200  mL 0   No facility-administered medications prior to visit.    ROS: Review of Systems  Constitutional: Positive for fatigue. Negative for activity change, appetite change, chills and unexpected weight change.  HENT: Negative for congestion, mouth sores and sinus pressure.   Eyes: Negative for visual disturbance.  Respiratory: Positive for shortness of breath. Negative for cough and chest tightness.   Gastrointestinal: Negative for abdominal pain and nausea.  Genitourinary: Negative for difficulty urinating, frequency and vaginal pain.  Musculoskeletal: Positive for arthralgias. Negative for back pain and gait problem.  Skin: Negative for pallor and rash.  Neurological: Negative for dizziness, tremors, weakness, numbness and headaches.  Psychiatric/Behavioral: Negative for confusion and sleep disturbance.    Objective:  BP (!) 146/74 (BP Location: Left Arm, Patient Position: Sitting, Cuff Size: Normal)   Pulse 64   Temp 98 F (36.7 C) (Oral)   Ht 5\' 4"  (1.626 m)   Wt 132 lb (59.9 kg)   SpO2 96%   BMI 22.66 kg/m   BP Readings from Last 3 Encounters:  07/23/19 (!) 146/74  05/06/19 132/76  02/05/19 134/74    Wt Readings from Last 3 Encounters:  07/23/19 132 lb (59.9 kg)  05/06/19 133 lb (60.3 kg)  02/05/19 138 lb (62.6 kg)    Physical Exam Constitutional:      General: She is not in acute  distress.    Appearance: She is well-developed.  HENT:     Head: Normocephalic.     Right Ear: External ear normal.     Left Ear: External ear normal.     Nose: Nose normal.  Eyes:     General:        Right eye: No discharge.        Left eye: No discharge.     Conjunctiva/sclera: Conjunctivae normal.     Pupils: Pupils are equal, round, and reactive to light.  Neck:     Thyroid: No thyromegaly.     Vascular: No JVD.     Trachea: No tracheal deviation.  Cardiovascular:     Rate and Rhythm: Normal rate and regular rhythm.     Heart sounds: Normal heart sounds.  Pulmonary:      Effort: No respiratory distress.     Breath sounds: No stridor. No wheezing.  Abdominal:     General: Bowel sounds are normal. There is no distension.     Palpations: Abdomen is soft. There is no mass.     Tenderness: There is no abdominal tenderness. There is no guarding or rebound.  Musculoskeletal:        General: Tenderness present.     Cervical back: Normal range of motion and neck supple.     Right lower leg: No edema.     Left lower leg: Edema present.  Lymphadenopathy:     Cervical: No cervical adenopathy.  Skin:    Findings: No erythema or rash.  Neurological:     Mental Status: She is oriented to person, place, and time.     Cranial Nerves: No cranial nerve deficit.     Motor: No abnormal muscle tone.     Coordination: Coordination normal.     Gait: Gait abnormal.     Deep Tendon Reflexes: Reflexes normal.  Psychiatric:        Behavior: Behavior normal.        Thought Content: Thought content normal.        Judgment: Judgment normal.    L ankle is swollen, tender   Lab Results  Component Value Date   WBC 13.0 (H) 12/19/2018   HGB 13.4 12/19/2018   HCT 41.2 12/19/2018   PLT 359.0 12/19/2018   GLUCOSE 94 05/06/2019   CHOL 199 03/21/2013   TRIG 107.0 03/21/2013   HDL 57.50 03/21/2013   LDLDIRECT 144.3 01/31/2011   LDLCALC 120 (H) 03/21/2013   ALT 9 01/01/2018   AST 17 01/01/2018   NA 142 05/06/2019   K 4.6 05/06/2019   CL 106 05/06/2019   CREATININE 2.31 (H) 05/06/2019   BUN 33 (H) 05/06/2019   CO2 26 05/06/2019   TSH 1.91 01/01/2018   INR 0.9 08/13/2008    DG Chest 2 View  Result Date: 12/20/2018 CLINICAL DATA:  Shortness of breath, COPD EXAM: CHEST - 2 VIEW COMPARISON:  Radiograph 04/29/2018, CT 01/01/2017 FINDINGS: Coarse interstitial changes with hyperinflation of the lungs and biapical lucency compatible with features of COPD and emphysema better seen on prior cross-sectional imaging. There is stable biapical pleuroparenchymal scarring as well.  No acute consolidative process. No features of edema. No pneumothorax or effusion. The aorta is calcified. The remaining cardiomediastinal contours are unremarkable. Degenerative changes are present in the imaged spine and shoulders. Surgical clips are present in the left upper quadrant. IMPRESSION: 1. Findings consistent with COPD and emphysema. 2. No acute cardiopulmonary disease. 3. Unchanged biapical scarring. 4.  Aortic  Atherosclerosis (ICD10-I70.0). Electronically Signed   By: Lovena Le M.D.   On: 12/20/2018 03:52    Assessment & Plan:   There are no diagnoses linked to this encounter.   No orders of the defined types were placed in this encounter.    Follow-up: No follow-ups on file.  Walker Kehr, MD

## 2019-07-28 ENCOUNTER — Telehealth: Payer: Self-pay

## 2019-07-28 NOTE — Telephone Encounter (Signed)
New message   The patient voiced C/o swelling in both  Toes / foot   Last seen  5.19.2021   Asking for a call back    .

## 2019-07-29 NOTE — Telephone Encounter (Signed)
Please see me or Dr. Stanford Breed.  Thanks

## 2019-07-29 NOTE — Telephone Encounter (Signed)
Left detailed message informing pt of below.  

## 2019-08-11 ENCOUNTER — Other Ambulatory Visit: Payer: Self-pay | Admitting: Internal Medicine

## 2019-08-12 ENCOUNTER — Ambulatory Visit: Payer: Medicare Other | Admitting: Internal Medicine

## 2019-08-18 ENCOUNTER — Ambulatory Visit: Payer: Medicare Other

## 2019-08-18 ENCOUNTER — Ambulatory Visit: Payer: Medicare Other | Admitting: Internal Medicine

## 2019-08-20 ENCOUNTER — Ambulatory Visit: Payer: Medicare Other | Admitting: Internal Medicine

## 2019-08-25 ENCOUNTER — Ambulatory Visit: Payer: Medicare Other | Admitting: Internal Medicine

## 2019-08-25 ENCOUNTER — Ambulatory Visit: Payer: Medicare Other

## 2019-08-26 ENCOUNTER — Inpatient Hospital Stay (HOSPITAL_COMMUNITY): Payer: Medicare Other

## 2019-08-26 ENCOUNTER — Inpatient Hospital Stay (HOSPITAL_COMMUNITY)
Admission: EM | Disposition: A | Discharge status: Home Health | Payer: Self-pay | Source: Home / Self Care | Attending: Cardiovascular Disease

## 2019-08-26 ENCOUNTER — Ambulatory Visit (HOSPITAL_COMMUNITY): Admit: 2019-08-26 | Payer: Medicare Other | Admitting: Cardiovascular Disease

## 2019-08-26 ENCOUNTER — Inpatient Hospital Stay (HOSPITAL_COMMUNITY)
Admission: EM | Admit: 2019-08-26 | Discharge: 2019-08-28 | DRG: 251 | Disposition: A | Discharge status: Home Health | Payer: Medicare Other | Attending: Cardiology | Admitting: Cardiology

## 2019-08-26 DIAGNOSIS — Z8249 Family history of ischemic heart disease and other diseases of the circulatory system: Secondary | ICD-10-CM

## 2019-08-26 DIAGNOSIS — Z881 Allergy status to other antibiotic agents status: Secondary | ICD-10-CM

## 2019-08-26 DIAGNOSIS — Z833 Family history of diabetes mellitus: Secondary | ICD-10-CM

## 2019-08-26 DIAGNOSIS — I351 Nonrheumatic aortic (valve) insufficiency: Secondary | ICD-10-CM | POA: Diagnosis not present

## 2019-08-26 DIAGNOSIS — I2102 ST elevation (STEMI) myocardial infarction involving left anterior descending coronary artery: Secondary | ICD-10-CM

## 2019-08-26 DIAGNOSIS — Z885 Allergy status to narcotic agent status: Secondary | ICD-10-CM

## 2019-08-26 DIAGNOSIS — I517 Cardiomegaly: Secondary | ICD-10-CM | POA: Diagnosis not present

## 2019-08-26 DIAGNOSIS — M1990 Unspecified osteoarthritis, unspecified site: Secondary | ICD-10-CM | POA: Diagnosis present

## 2019-08-26 DIAGNOSIS — N184 Chronic kidney disease, stage 4 (severe): Secondary | ICD-10-CM | POA: Diagnosis not present

## 2019-08-26 DIAGNOSIS — K219 Gastro-esophageal reflux disease without esophagitis: Secondary | ICD-10-CM | POA: Diagnosis not present

## 2019-08-26 DIAGNOSIS — E559 Vitamin D deficiency, unspecified: Secondary | ICD-10-CM | POA: Diagnosis present

## 2019-08-26 DIAGNOSIS — I252 Old myocardial infarction: Secondary | ICD-10-CM

## 2019-08-26 DIAGNOSIS — F329 Major depressive disorder, single episode, unspecified: Secondary | ICD-10-CM | POA: Diagnosis present

## 2019-08-26 DIAGNOSIS — Z880 Allergy status to penicillin: Secondary | ICD-10-CM

## 2019-08-26 DIAGNOSIS — Z8711 Personal history of peptic ulcer disease: Secondary | ICD-10-CM

## 2019-08-26 DIAGNOSIS — Z8261 Family history of arthritis: Secondary | ICD-10-CM

## 2019-08-26 DIAGNOSIS — I249 Acute ischemic heart disease, unspecified: Secondary | ICD-10-CM

## 2019-08-26 DIAGNOSIS — I2129 ST elevation (STEMI) myocardial infarction involving other sites: Secondary | ICD-10-CM | POA: Diagnosis not present

## 2019-08-26 DIAGNOSIS — Z79899 Other long term (current) drug therapy: Secondary | ICD-10-CM

## 2019-08-26 DIAGNOSIS — Z9861 Coronary angioplasty status: Secondary | ICD-10-CM | POA: Diagnosis not present

## 2019-08-26 DIAGNOSIS — I480 Paroxysmal atrial fibrillation: Secondary | ICD-10-CM | POA: Diagnosis present

## 2019-08-26 DIAGNOSIS — Z7982 Long term (current) use of aspirin: Secondary | ICD-10-CM

## 2019-08-26 DIAGNOSIS — I130 Hypertensive heart and chronic kidney disease with heart failure and stage 1 through stage 4 chronic kidney disease, or unspecified chronic kidney disease: Secondary | ICD-10-CM | POA: Diagnosis present

## 2019-08-26 DIAGNOSIS — E538 Deficiency of other specified B group vitamins: Secondary | ICD-10-CM | POA: Diagnosis present

## 2019-08-26 DIAGNOSIS — F419 Anxiety disorder, unspecified: Secondary | ICD-10-CM | POA: Diagnosis present

## 2019-08-26 DIAGNOSIS — R0789 Other chest pain: Secondary | ICD-10-CM | POA: Diagnosis present

## 2019-08-26 DIAGNOSIS — I509 Heart failure, unspecified: Secondary | ICD-10-CM | POA: Diagnosis present

## 2019-08-26 DIAGNOSIS — Z7902 Long term (current) use of antithrombotics/antiplatelets: Secondary | ICD-10-CM

## 2019-08-26 DIAGNOSIS — Z20822 Contact with and (suspected) exposure to covid-19: Secondary | ICD-10-CM | POA: Diagnosis not present

## 2019-08-26 DIAGNOSIS — I2510 Atherosclerotic heart disease of native coronary artery without angina pectoris: Secondary | ICD-10-CM

## 2019-08-26 DIAGNOSIS — E785 Hyperlipidemia, unspecified: Secondary | ICD-10-CM | POA: Diagnosis present

## 2019-08-26 DIAGNOSIS — J90 Pleural effusion, not elsewhere classified: Secondary | ICD-10-CM | POA: Diagnosis not present

## 2019-08-26 DIAGNOSIS — I25119 Atherosclerotic heart disease of native coronary artery with unspecified angina pectoris: Secondary | ICD-10-CM | POA: Diagnosis present

## 2019-08-26 DIAGNOSIS — I10 Essential (primary) hypertension: Secondary | ICD-10-CM | POA: Diagnosis not present

## 2019-08-26 DIAGNOSIS — J439 Emphysema, unspecified: Secondary | ICD-10-CM | POA: Diagnosis not present

## 2019-08-26 DIAGNOSIS — Z9081 Acquired absence of spleen: Secondary | ICD-10-CM | POA: Diagnosis not present

## 2019-08-26 DIAGNOSIS — I4819 Other persistent atrial fibrillation: Secondary | ICD-10-CM | POA: Diagnosis present

## 2019-08-26 DIAGNOSIS — M19049 Primary osteoarthritis, unspecified hand: Secondary | ICD-10-CM | POA: Diagnosis not present

## 2019-08-26 DIAGNOSIS — Z888 Allergy status to other drugs, medicaments and biological substances status: Secondary | ICD-10-CM

## 2019-08-26 DIAGNOSIS — J449 Chronic obstructive pulmonary disease, unspecified: Secondary | ICD-10-CM | POA: Diagnosis not present

## 2019-08-26 DIAGNOSIS — Z87891 Personal history of nicotine dependence: Secondary | ICD-10-CM

## 2019-08-26 DIAGNOSIS — I219 Acute myocardial infarction, unspecified: Secondary | ICD-10-CM | POA: Insufficient documentation

## 2019-08-26 DIAGNOSIS — R079 Chest pain, unspecified: Secondary | ICD-10-CM | POA: Diagnosis not present

## 2019-08-26 DIAGNOSIS — R54 Age-related physical debility: Secondary | ICD-10-CM | POA: Diagnosis present

## 2019-08-26 HISTORY — PX: CORONARY/GRAFT ACUTE MI REVASCULARIZATION: CATH118305

## 2019-08-26 LAB — POCT I-STAT, CHEM 8
BUN: 21 mg/dL (ref 8–23)
Calcium, Ion: 1.13 mmol/L — ABNORMAL LOW (ref 1.15–1.40)
Chloride: 108 mmol/L (ref 98–111)
Creatinine, Ser: 2 mg/dL — ABNORMAL HIGH (ref 0.44–1.00)
Glucose, Bld: 127 mg/dL — ABNORMAL HIGH (ref 70–99)
HCT: 44 % (ref 36.0–46.0)
Hemoglobin: 15 g/dL (ref 12.0–15.0)
Potassium: 3.6 mmol/L (ref 3.5–5.1)
Sodium: 141 mmol/L (ref 135–145)
TCO2: 26 mmol/L (ref 22–32)

## 2019-08-26 LAB — CBC WITH DIFFERENTIAL/PLATELET
Abs Immature Granulocytes: 0.03 10*3/uL (ref 0.00–0.07)
Basophils Absolute: 0.1 10*3/uL (ref 0.0–0.1)
Basophils Relative: 1 %
Eosinophils Absolute: 0.3 10*3/uL (ref 0.0–0.5)
Eosinophils Relative: 3 %
HCT: 41.8 % (ref 36.0–46.0)
Hemoglobin: 13.3 g/dL (ref 12.0–15.0)
Immature Granulocytes: 0 %
Lymphocytes Relative: 29 %
Lymphs Abs: 2.8 10*3/uL (ref 0.7–4.0)
MCH: 30.4 pg (ref 26.0–34.0)
MCHC: 31.8 g/dL (ref 30.0–36.0)
MCV: 95.7 fL (ref 80.0–100.0)
Monocytes Absolute: 1 10*3/uL (ref 0.1–1.0)
Monocytes Relative: 10 %
Neutro Abs: 5.7 10*3/uL (ref 1.7–7.7)
Neutrophils Relative %: 57 %
Platelets: 355 10*3/uL (ref 150–400)
RBC: 4.37 MIL/uL (ref 3.87–5.11)
RDW: 15.9 % — ABNORMAL HIGH (ref 11.5–15.5)
WBC: 9.9 10*3/uL (ref 4.0–10.5)
nRBC: 0 % (ref 0.0–0.2)

## 2019-08-26 LAB — LIPID PANEL
Cholesterol: 239 mg/dL — ABNORMAL HIGH (ref 0–200)
HDL: 54 mg/dL (ref >40)
LDL Cholesterol: 154 mg/dL — ABNORMAL HIGH (ref 0–99)
Total CHOL/HDL Ratio: 4.4 RATIO
Triglycerides: 155 mg/dL — ABNORMAL HIGH (ref <150)
VLDL: 31 mg/dL (ref 0–40)

## 2019-08-26 LAB — COMPREHENSIVE METABOLIC PANEL
ALT: 12 U/L (ref 0–44)
AST: 32 U/L (ref 15–41)
Albumin: 3.1 g/dL — ABNORMAL LOW (ref 3.5–5.0)
Alkaline Phosphatase: 49 U/L (ref 38–126)
Anion gap: 13 (ref 5–15)
BUN: 20 mg/dL (ref 8–23)
CO2: 23 mmol/L (ref 22–32)
Calcium: 9 mg/dL (ref 8.9–10.3)
Chloride: 106 mmol/L (ref 98–111)
Creatinine, Ser: 1.93 mg/dL — ABNORMAL HIGH (ref 0.44–1.00)
GFR calc Af Amer: 28 mL/min — ABNORMAL LOW (ref >60)
GFR calc non Af Amer: 24 mL/min — ABNORMAL LOW (ref >60)
Glucose, Bld: 123 mg/dL — ABNORMAL HIGH (ref 70–99)
Potassium: 3.6 mmol/L (ref 3.5–5.1)
Sodium: 142 mmol/L (ref 135–145)
Total Bilirubin: 0.6 mg/dL (ref 0.3–1.2)
Total Protein: 6.1 g/dL — ABNORMAL LOW (ref 6.5–8.1)

## 2019-08-26 LAB — BRAIN NATRIURETIC PEPTIDE: B Natriuretic Peptide: 1086.4 pg/mL — ABNORMAL HIGH (ref 0.0–100.0)

## 2019-08-26 LAB — PROTIME-INR
INR: 1 (ref 0.8–1.2)
Prothrombin Time: 12.5 seconds (ref 11.4–15.2)

## 2019-08-26 LAB — SARS CORONAVIRUS 2 BY RT PCR (HOSPITAL ORDER, PERFORMED IN ~~LOC~~ HOSPITAL LAB): SARS Coronavirus 2: NEGATIVE

## 2019-08-26 LAB — POCT ACTIVATED CLOTTING TIME: Activated Clotting Time: 180 seconds

## 2019-08-26 LAB — TROPONIN I (HIGH SENSITIVITY): Troponin I (High Sensitivity): 3863 ng/L (ref <18)

## 2019-08-26 LAB — MAGNESIUM: Magnesium: 1.9 mg/dL (ref 1.7–2.4)

## 2019-08-26 LAB — HEMOGLOBIN A1C
Hgb A1c MFr Bld: 6.3 % — ABNORMAL HIGH (ref 4.8–5.6)
Mean Plasma Glucose: 134.11 mg/dL

## 2019-08-26 LAB — APTT: aPTT: 33 seconds (ref 24–36)

## 2019-08-26 SURGERY — CORONARY/GRAFT ACUTE MI REVASCULARIZATION
Anesthesia: LOCAL

## 2019-08-26 MED ORDER — CLONAZEPAM 0.5 MG PO TABS: 0.5000 mg | ORAL_TABLET | Freq: Every evening | ORAL | Status: DC | PRN

## 2019-08-26 MED ORDER — ASPIRIN 81 MG PO CHEW
81.0000 mg | CHEWABLE_TABLET | Freq: Every day | ORAL | Status: DC
  Administered 2019-08-27 – 2019-08-28 (×2): 81 mg via ORAL
  Filled 2019-08-26 (×2): qty 1

## 2019-08-26 MED ORDER — ONDANSETRON HCL 4 MG/2ML IJ SOLN
INTRAMUSCULAR | Status: AC
  Filled 2019-08-26: qty 2

## 2019-08-26 MED ORDER — ACETAMINOPHEN 325 MG PO TABS: 650.0000 mg | ORAL_TABLET | ORAL | Status: DC | PRN

## 2019-08-26 MED ORDER — LABETALOL HCL 5 MG/ML IV SOLN
INTRAVENOUS | Status: DC | PRN
  Administered 2019-08-26: 10 mg via INTRAVENOUS

## 2019-08-26 MED ORDER — LABETALOL HCL 5 MG/ML IV SOLN
10.0000 mg | INTRAVENOUS | Status: AC | PRN
  Administered 2019-08-26: 10 mg via INTRAVENOUS

## 2019-08-26 MED ORDER — HEPARIN SODIUM (PORCINE) 1000 UNIT/ML IJ SOLN
INTRAMUSCULAR | Status: AC
  Filled 2019-08-26: qty 1

## 2019-08-26 MED ORDER — FLUTICASONE FUROATE-VILANTEROL 100-25 MCG/INH IN AEPB
1.0000 | INHALATION_SPRAY | Freq: Every day | RESPIRATORY_TRACT | Status: DC
  Administered 2019-08-27 – 2019-08-28 (×2): 1 via RESPIRATORY_TRACT
  Filled 2019-08-26 (×2): qty 28

## 2019-08-26 MED ORDER — HEPARIN (PORCINE) IN NACL 1000-0.9 UT/500ML-% IV SOLN
INTRAVENOUS | Status: AC
  Filled 2019-08-26: qty 1000

## 2019-08-26 MED ORDER — METOPROLOL TARTRATE 12.5 MG HALF TABLET
12.5000 mg | ORAL_TABLET | Freq: Two times a day (BID) | ORAL | Status: DC
  Administered 2019-08-26 – 2019-08-28 (×4): 12.5 mg via ORAL
  Filled 2019-08-26 (×4): qty 1

## 2019-08-26 MED ORDER — ASPIRIN 81 MG PO CHEW: 324.0000 mg | CHEWABLE_TABLET | Freq: Once | ORAL | Status: DC

## 2019-08-26 MED ORDER — SODIUM CHLORIDE 0.9 % IV SOLN: 250.0000 mL | INTRAVENOUS | Status: DC | PRN

## 2019-08-26 MED ORDER — MIDAZOLAM HCL 2 MG/2ML IJ SOLN
INTRAMUSCULAR | Status: AC
  Filled 2019-08-26: qty 2

## 2019-08-26 MED ORDER — CANGRELOR TETRASODIUM 50 MG IV SOLR
INTRAVENOUS | Status: AC
  Filled 2019-08-26: qty 50

## 2019-08-26 MED ORDER — CLOPIDOGREL BISULFATE 75 MG PO TABS: 300.0000 mg | ORAL_TABLET | Freq: Once | ORAL | Status: DC

## 2019-08-26 MED ORDER — HEPARIN (PORCINE) IN NACL 1000-0.9 UT/500ML-% IV SOLN
INTRAVENOUS | Status: DC | PRN
  Administered 2019-08-26 (×2): 500 mL

## 2019-08-26 MED ORDER — LIDOCAINE HCL (PF) 1 % IJ SOLN
INTRAMUSCULAR | Status: DC | PRN
  Administered 2019-08-26: 2 mL

## 2019-08-26 MED ORDER — CANGRELOR BOLUS VIA INFUSION
INTRAVENOUS | Status: DC | PRN
  Administered 2019-08-26: 1740 ug via INTRAVENOUS

## 2019-08-26 MED ORDER — LABETALOL HCL 5 MG/ML IV SOLN
INTRAVENOUS | Status: AC
  Filled 2019-08-26: qty 4

## 2019-08-26 MED ORDER — SODIUM CHLORIDE 0.9 % IV SOLN: INTRAVENOUS | Status: AC

## 2019-08-26 MED ORDER — IOHEXOL 350 MG/ML SOLN
INTRAVENOUS | Status: DC | PRN
  Administered 2019-08-26: 75 mL

## 2019-08-26 MED ORDER — ONDANSETRON HCL 4 MG/2ML IJ SOLN
INTRAMUSCULAR | Status: DC | PRN
  Administered 2019-08-26 (×2): 4 mg via INTRAVENOUS

## 2019-08-26 MED ORDER — SODIUM CHLORIDE 0.9 % IV SOLN
INTRAVENOUS | Status: AC
  Filled 2019-08-26: qty 250

## 2019-08-26 MED ORDER — NITROGLYCERIN 1 MG/10 ML FOR IR/CATH LAB
INTRA_ARTERIAL | Status: DC | PRN
  Administered 2019-08-26: 200 ug

## 2019-08-26 MED ORDER — AMLODIPINE BESYLATE 5 MG PO TABS
5.0000 mg | ORAL_TABLET | Freq: Every day | ORAL | Status: DC
  Administered 2019-08-27 – 2019-08-28 (×2): 5 mg via ORAL
  Filled 2019-08-26 (×2): qty 1

## 2019-08-26 MED ORDER — UMECLIDINIUM BROMIDE 62.5 MCG/INH IN AEPB
1.0000 | INHALATION_SPRAY | Freq: Every day | RESPIRATORY_TRACT | Status: DC
  Administered 2019-08-27 – 2019-08-28 (×2): 1 via RESPIRATORY_TRACT
  Filled 2019-08-26 (×2): qty 7

## 2019-08-26 MED ORDER — NITROGLYCERIN 1 MG/10 ML FOR IR/CATH LAB
INTRA_ARTERIAL | Status: AC
  Filled 2019-08-26: qty 10

## 2019-08-26 MED ORDER — SODIUM CHLORIDE 0.9% FLUSH
3.0000 mL | Freq: Two times a day (BID) | INTRAVENOUS | Status: DC
  Administered 2019-08-26 – 2019-08-28 (×4): 3 mL via INTRAVENOUS

## 2019-08-26 MED ORDER — LIDOCAINE HCL (PF) 1 % IJ SOLN
INTRAMUSCULAR | Status: AC
  Filled 2019-08-26: qty 30

## 2019-08-26 MED ORDER — VALACYCLOVIR HCL 500 MG PO TABS
1000.0000 mg | ORAL_TABLET | Freq: Two times a day (BID) | ORAL | Status: DC
  Administered 2019-08-26: 1000 mg via ORAL
  Filled 2019-08-26 (×4): qty 2

## 2019-08-26 MED ORDER — VERAPAMIL HCL 2.5 MG/ML IV SOLN
INTRAVENOUS | Status: AC
  Filled 2019-08-26: qty 2

## 2019-08-26 MED ORDER — ONDANSETRON HCL 4 MG/2ML IJ SOLN: 4.0000 mg | Freq: Once | INTRAMUSCULAR | Status: DC

## 2019-08-26 MED ORDER — HEPARIN SODIUM (PORCINE) 1000 UNIT/ML IJ SOLN
INTRAMUSCULAR | Status: DC | PRN
  Administered 2019-08-26: 3000 [IU] via INTRAVENOUS
  Administered 2019-08-26: 2000 [IU] via INTRAVENOUS

## 2019-08-26 MED ORDER — VERAPAMIL HCL 2.5 MG/ML IV SOLN
INTRAVENOUS | Status: DC | PRN
  Administered 2019-08-26: 10 mL via INTRA_ARTERIAL

## 2019-08-26 MED ORDER — MIDAZOLAM HCL 2 MG/2ML IJ SOLN
INTRAMUSCULAR | Status: DC | PRN
  Administered 2019-08-26 (×2): 1 mg via INTRAVENOUS

## 2019-08-26 MED ORDER — IOHEXOL 350 MG/ML SOLN
INTRAVENOUS | Status: AC
  Filled 2019-08-26: qty 1

## 2019-08-26 MED ORDER — FLUTICASONE-UMECLIDIN-VILANT 100-62.5-25 MCG/INH IN AEPB: 1.0000 | INHALATION_SPRAY | Freq: Every day | RESPIRATORY_TRACT | Status: DC

## 2019-08-26 MED ORDER — MAGIC MOUTHWASH W/LIDOCAINE
5.0000 mL | Freq: Four times a day (QID) | ORAL | Status: DC | PRN
  Filled 2019-08-26: qty 5

## 2019-08-26 MED ORDER — HYDRALAZINE HCL 20 MG/ML IJ SOLN: 10.0000 mg | INTRAMUSCULAR | Status: AC | PRN

## 2019-08-26 MED ORDER — PANTOPRAZOLE SODIUM 40 MG PO TBEC
40.0000 mg | DELAYED_RELEASE_TABLET | Freq: Every day | ORAL | Status: DC
  Administered 2019-08-27 – 2019-08-28 (×2): 40 mg via ORAL
  Filled 2019-08-26 (×2): qty 1

## 2019-08-26 MED ORDER — ONDANSETRON HCL 4 MG/2ML IJ SOLN
4.0000 mg | Freq: Four times a day (QID) | INTRAMUSCULAR | Status: DC | PRN
  Administered 2019-08-26: 4 mg via INTRAVENOUS
  Filled 2019-08-26: qty 2

## 2019-08-26 MED ORDER — CLOPIDOGREL BISULFATE 75 MG PO TABS
75.0000 mg | ORAL_TABLET | Freq: Every day | ORAL | Status: DC
  Administered 2019-08-27 – 2019-08-28 (×2): 75 mg via ORAL
  Filled 2019-08-26 (×2): qty 1

## 2019-08-26 MED ORDER — SODIUM CHLORIDE 0.9 % IV SOLN
INTRAVENOUS | Status: DC | PRN
  Administered 2019-08-26: 0.75 ug/kg/min via INTRAVENOUS

## 2019-08-26 MED ORDER — SODIUM CHLORIDE 0.9% FLUSH: 3.0000 mL | INTRAVENOUS | Status: DC | PRN

## 2019-08-26 SURGICAL SUPPLY — 15 items
BALLN SAPPHIRE 2.0X12 (BALLOONS) ×2
BALLOON SAPPHIRE 2.0X12 (BALLOONS) ×1 IMPLANT
CATH 5FR JL3.5 JR4 ANG PIG MP (CATHETERS) ×2 IMPLANT
CATH INFINITI 5FR JL4 (CATHETERS) ×2 IMPLANT
CATH LAUNCHER 5F EBU3.5 (CATHETERS) ×2 IMPLANT
DEVICE RAD TR BAND REGULAR (VASCULAR PRODUCTS) ×2 IMPLANT
GLIDESHEATH SLEND SS 6F .021 (SHEATH) ×2 IMPLANT
GUIDEWIRE INQWIRE 1.5J.035X260 (WIRE) ×1 IMPLANT
INQWIRE 1.5J .035X260CM (WIRE) ×2
KIT ENCORE 26 ADVANTAGE (KITS) ×2 IMPLANT
KIT HEART LEFT (KITS) ×2 IMPLANT
PACK CARDIAC CATHETERIZATION (CUSTOM PROCEDURE TRAY) ×2 IMPLANT
TRANSDUCER W/STOPCOCK (MISCELLANEOUS) ×2 IMPLANT
TUBING CIL FLEX 10 FLL-RA (TUBING) ×2 IMPLANT
WIRE COUGAR XT STRL 190CM (WIRE) ×2 IMPLANT

## 2019-08-26 NOTE — ED Provider Notes (Signed)
[facility] EMERGENCY DEPARTMENT Provider Note   CSN: [id number] Arrival date & time: 08/26/19  1213     History No chief complaint on file.   [name] is a 84 y.o. female.  HPI    Patient seen on arrival to the emergency department due to presentation for chest pain. Patient arrives via EMS, and history is obtained by individuals as well as the patient herself. Patient notes chest pain, beginning earlier in the day, onset concurrent with nausea, vomiting. She notes that she may have forgotten to take her medication last night, does have a history of cardiac disease. Since onset she has had discomfort persistently. EMS providers note that on arrival the patient complained of chest pain, and their initial rhythm strip demonstrated elevation in the lateral leads, depression inferiorly and she was designated as a code STEMI. In route the patient received morphine, fluids, and no noted change in her condition. On arrival the patient continues to complain of discomfort.  I conducted my evaluation with our cardiology colleagues given the patient's pain, history, abnormal EKG. Some limitation of history secondary to patient's acuity of condition, level 5 caveat. Past Medical History:  Diagnosis Date  . Anxiety   . Arthritis   . Atrial fibrillation ([place])   . B12 deficiency   . COPD (chronic obstructive pulmonary disease) ([place])   . Depression   . GERD (gastroesophageal reflux disease)   . Hypertension   . Osteoarthrosis, hand    both hands  . Peptic ulcer, unspecified site, unspecified as acute or chronic, without mention of hemorrhage, perforation, or obstruction   . Pneumonia   . PONV (postoperative nausea and vomiting)   . Vitamin D deficiency disease     Patient Active Problem List   Diagnosis Date Noted  . Ankle pain, left 07/23/2019  . Intertrigo 02/05/2019  . Thrush 01/10/2019  . Shingles 09/16/2018  . Coronary atherosclerosis 05/06/2018  .  Esophageal dysmotility 01/11/2018  . Weight gain 01/01/2018  . Abdominal bloating 01/01/2018  . Hyperkalemia 06/04/2017  . Grief at loss of child 04/02/2017  . Adenopathy, cervical 11/29/2016  . Dysuria 10/31/2016  . Vertigo 10/31/2016  . Ureteral calculus 09/11/2016  . CAP (community acquired pneumonia) 08/30/2016  . Fatigue 08/24/2016  . Sensation of pressure in bladder area 08/24/2016  . Kidney stone 08/13/2016  . Flank pain 08/10/2016  . Right upper quadrant abdominal tenderness without rebound tenderness 08/10/2016  . Leukocytosis 08/10/2016  . Acute abdomen 08/10/2016  . Intractable nausea and vomiting 08/10/2016  . ARF (acute renal failure) ([place]) 08/10/2016  . Urolithiasis 08/10/2016  . Abdominal pain 08/10/2016  . Anemia 05/19/2016  . Generalized anxiety disorder 01/14/2016  . Dysphagia 04/16/2015  . Dry skin dermatitis 12/28/2014  . Atrophic vaginitis 05/26/2014  . Varicose veins of both lower extremities 12/12/2013  . Restless leg syndrome 03/17/2013  . Impacted cerumen of left ear 03/17/2013  . Osteoarthritis, hand 09/05/2011  . Epistaxis, recurrent 08/17/2011  . Well adult exam 01/31/2011  . Low back pain 01/31/2011  . Palpitations 05/24/2010  . Cerumen impaction 02/23/2010  . PHARYNGITIS, ACUTE 02/23/2010  . SINUSITIS, ACUTE 01/07/2010  . SOB (shortness of breath) 09/23/2009  . BRONCHITIS, ACUTE 08/03/2009  . Osteoarthritis 07/06/2009  . TOBACCO USE, QUIT 03/08/2009  . ALLERGIC RHINITIS 11/06/2008  . NAUSEA ALONE 08/19/2008  . DIARRHEA 08/19/2008  . Essential hypertension 07/15/2008  . EMPHYSEMA 07/15/2008  . PEPTIC ULCER DISEASE 07/15/2008  . ARTHRITIS 07/15/2008  . HEADACHE 05/25/2008  .  SYNCOPE 03/23/2008  . WEIGHT LOSS, ABNORMAL 03/23/2008  . HEARING IMPAIRMENT 12/23/2007  . B12 deficiency 10/14/2007  . Malaise 10/14/2007  . INSOMNIA, PERSISTENT 07/31/2007  . Anxiety state 03/01/2007  . DEPRESSION 03/01/2007  . Atrial fibrillation ([place])  02/26/2007  . Vitamin D deficiency 02/19/2007  . GERD 02/19/2007  . COPD with chronic bronchitis ([place]) 08/07/2006    Past Surgical History:  Procedure Laterality Date  . BLADDER SURGERY     Bladder tack and intestines  . CYSTOCELE REPAIR    . CYSTOSCOPY WITH RETROGRADE PYELOGRAM, URETEROSCOPY AND STENT PLACEMENT Right 09/11/2016   Procedure: CYSTOSCOPY WITH RETROGRADE PYELOGRAM, URETEROSCOPY AND STENT REPLACEMENT;  Surgeon: Cleon Gustin, MD;  Location: [facility];  Service: Urology;  Laterality: Right;  . CYSTOSCOPY WITH STENT PLACEMENT Right 08/11/2016   Procedure: CYSTOSCOPY, URETERSCOPY,  RIGHT RETROGRADE WITH RIGHT STENT PLACEMENT;  Surgeon: Festus Aloe, MD;  Location: [facility];  Service: Urology;  Laterality: Right;  . SPLENECTOMY       OB History   No obstetric history on file.     Family History  Problem Relation Age of Onset  . Heart disease Father   . Arthritis Other        Family history of  . Coronary artery disease Other        Family history of  . Hypertension Other        Family history of  . Diabetes Daughter        77 died    Social History   Tobacco Use  . Smoking status: Former Smoker    Packs/day: 1.00    Types: Cigarettes    Quit date: 2010    Years since quitting: 11.4  . Smokeless tobacco: Never Used  . Tobacco comment: pt unsure how many years she smoked  Vaping Use  . Vaping Use: Never used  Substance Use Topics  . Alcohol use: No  . Drug use: No    Home Medications Prior to Admission medications   Medication Sig Start Date End Date Taking? Authorizing Provider  amLODipine (NORVASC) 5 MG tablet TAKE 1 TABLET(5 MG) BY MOUTH DAILY 02/05/19   Plotnikov, Evie Lacks, MD  Aspirin-Salicylamide-Caffeine (BC HEADACHE POWDER PO) Take 1 packet by mouth daily as needed (headaches).    [provider]  Biotin 1000 MCG tablet Take 1,000 mcg by mouth daily.    [provider]  Cholecalciferol ([employer] VITAMIN D3) 1000 UNITS capsule  Take 1,000 Units by mouth daily.      [provider]  clonazePAM (KLONOPIN) 0.5 MG tablet TAKE 1 TABLET BY MOUTH AT BEDTIME AS NEEDED FOR LEG CRAMPS 02/05/19   Plotnikov, Evie Lacks, MD  cyanocobalamin 1000 MCG tablet Take 1,000 mcg by mouth daily.    [provider]  flecainide (TAMBOCOR) 50 MG tablet TAKE 1 TABLET(50 MG) BY MOUTH TWICE DAILY 03/28/19   Lelon Perla, MD  Fluticasone-Umeclidin-Vilant (TRELEGY ELLIPTA) 100-62.5-25 MCG/INH AEPB Inhale 1 Inhaler into the lungs daily. 05/06/19   Plotnikov, Evie Lacks, MD  ketoconazole (NIZORAL) 2 % cream APPLY TOPICALLY TO THE AFFECTED AREA DAILY 08/11/19   Plotnikov, Evie Lacks, MD  magic mouthwash w/lidocaine SOLN Take 5 mLs by mouth 4 (four) times daily as needed for mouth pain. Patient not taking: Reported on 07/23/2019 07/10/19   Plotnikov, Evie Lacks, MD  methylPREDNISolone (MEDROL DOSEPAK) 4 MG TBPK tablet As directed 07/23/19   Plotnikov, Evie Lacks, MD  metoprolol tartrate (LOPRESSOR) 25 MG tablet TAKE 1/2 TABLET(12.5 MG) BY  MOUTH TWICE DAILY 02/05/19   Plotnikov, Evie Lacks, MD  pantoprazole (PROTONIX) 40 MG tablet Take 1 tablet (40 mg total) by mouth daily. 02/05/19   Plotnikov, Evie Lacks, MD  tobramycin (TOBREX) 0.3 % ophthalmic solution  11/30/18   [provider]  valACYclovir (VALTREX) 500 MG tablet Take 2 tablets (1,000 mg total) by mouth 2 (two) times daily. 09/16/18   Plotnikov, Evie Lacks, MD    Allergies    Amoxicillin, Codeine, Levaquin [levofloxacin in d5w], and Other  Review of Systems   Review of Systems  Unable to perform ROS: Acuity of condition    Physical Exam Updated Vital Signs There were no vitals taken for this visit.  Physical Exam Vitals and nursing note reviewed.  Constitutional:      Appearance: She is well-developed. She is ill-appearing.  HENT:     Head: Normocephalic and atraumatic.  Eyes:     Conjunctiva/sclera: Conjunctivae normal.  Cardiovascular:     Rate and Rhythm: Normal rate  and regular rhythm.  Pulmonary:     Effort: Pulmonary effort is normal. No respiratory distress.     Breath sounds: No stridor.  Abdominal:     General: There is no distension.  Musculoskeletal:        General: No deformity.  Skin:    General: Skin is warm and dry.  Neurological:     Mental Status: She is alert and oriented to person, place, and time.     Cranial Nerves: No cranial nerve deficit.  Psychiatric:     Comments: Slowed     ED Results / Procedures / Treatments   Labs (all labs ordered are listed, but only abnormal results are displayed) Labs Reviewed  SARS CORONAVIRUS 2 BY RT PCR (HOSPITAL ORDER, [facility])  HEMOGLOBIN A1C  CBC WITH DIFFERENTIAL/PLATELET  PROTIME-INR  APTT  COMPREHENSIVE METABOLIC PANEL  LIPID PANEL  MAGNESIUM  BRAIN NATRIURETIC PEPTIDE  CBG MONITORING, ED  TROPONIN I (HIGH SENSITIVITY)   EMS rhythm strip rate 61, elevation laterally, depressions inferiorly, concerning for acute coronary syndrome.  Radiology No results found.  Procedures Procedures (including critical care time)  Medications Ordered in ED Medications  aspirin chewable tablet 324 mg (has no administration in time range)  ondansetron (ZOFRAN) injection 4 mg (has no administration in time range)    ED Course  I have reviewed the triage vital signs and the nursing notes.  Pertinent labs & imaging results that were available during my care of the patient were reviewed by me and considered in my medical decision making (see chart for details).     Elderly female presenting with chest pain that began in the hours prior to ED arrival with nausea, vomiting, abnormal EKG, designated as a code STEMI. After my initial evaluation the patient, soon after the patient was seen and evaluated by our cardiology colleagues, and given concern for ongoing ischemia she required transfer to our catheterization lab expeditiously. MDM  Rules/Calculators/A&P MDM Number of Diagnoses or Management Options ACS (acute coronary syndrome) ([place]): new, needed workup   Amount and/or Complexity of Data Reviewed Clinical lab tests: ordered Tests in the radiology section of CPT: reviewed and ordered Tests in the medicine section of CPT: ordered Decide to obtain previous medical records or to obtain history from someone other than the patient: yes Obtain history from someone other than the patient: yes Discuss the patient with other providers: yes  Risk of Complications, Morbidity, and/or Mortality Presenting problems: high Diagnostic procedures:  high Management options: high  Critical Care Total time providing critical care: < 30 minutes  Patient Progress Patient progress: other (comment) (Persistent Sx, requires cath lab)  Final Clinical Impression(s) / ED Diagnoses Final diagnoses:  ACS (acute coronary syndrome) ([place])     Carmin Muskrat, MD 08/26/19 1225

## 2019-08-26 NOTE — H&P (Signed)
Cardiology Admission History and Physical:   Patient ID: [name] MRN: [id number]; DOB: Feb 22, 1936   Admission date: 08/26/2019  Primary Care Provider: Cassandria Anger, MD [place] Cardiologist: No primary care provider on file.  [facility] Electrophysiologist:  None   Chief Complaint: Chest pain  Patient Profile:   [name] is a 84 y.o. female with history of paroxysmal atrial fibrillation who is refused anticoagulation, now presenting with an acute lateral STEMI  History of Present Illness:   [name] was in her normal state of health until this morning when she developed severe substernal chest pain and nausea with weakness.  She felt poorly throughout the morning and ultimately called EMS.  On their arrival EKG demonstrates lateral STEMI pattern and a code STEMI was activated.  On arrival to the emergency department, she continues to have discomfort in her chest, feels lightheaded, and complains of nausea.  She has had some shortness of breath as well.  The patient has COPD but is not on home O2.  Other than dizziness and shortness of breath, she denies any acute associated symptoms.   Past Medical History:  Diagnosis Date  . Anxiety   . Arthritis   . Atrial fibrillation ([place])   . B12 deficiency   . COPD (chronic obstructive pulmonary disease) ([place])   . Depression   . GERD (gastroesophageal reflux disease)   . Hypertension   . Osteoarthrosis, hand    both hands  . Peptic ulcer, unspecified site, unspecified as acute or chronic, without mention of hemorrhage, perforation, or obstruction   . Pneumonia   . PONV (postoperative nausea and vomiting)   . Vitamin D deficiency disease     Past Surgical History:  Procedure Laterality Date  . BLADDER SURGERY     Bladder tack and intestines  . CYSTOCELE REPAIR    . CYSTOSCOPY WITH RETROGRADE PYELOGRAM, URETEROSCOPY AND STENT PLACEMENT Right 09/11/2016   Procedure: CYSTOSCOPY WITH RETROGRADE PYELOGRAM,  URETEROSCOPY AND STENT REPLACEMENT;  Surgeon: Cleon Gustin, MD;  Location: [facility];  Service: Urology;  Laterality: Right;  . CYSTOSCOPY WITH STENT PLACEMENT Right 08/11/2016   Procedure: CYSTOSCOPY, URETERSCOPY,  RIGHT RETROGRADE WITH RIGHT STENT PLACEMENT;  Surgeon: Festus Aloe, MD;  Location: [facility];  Service: Urology;  Laterality: Right;  . SPLENECTOMY       Medications Prior to Admission: Prior to Admission medications   Medication Sig Start Date End Date Taking? Authorizing Provider  amLODipine (NORVASC) 5 MG tablet TAKE 1 TABLET(5 MG) BY MOUTH DAILY 02/05/19   Plotnikov, Evie Lacks, MD  Aspirin-Salicylamide-Caffeine (BC HEADACHE POWDER PO) Take 1 packet by mouth daily as needed (headaches).    [provider]  Biotin 1000 MCG tablet Take 1,000 mcg by mouth daily.    [provider]  Cholecalciferol ([employer] VITAMIN D3) 1000 UNITS capsule Take 1,000 Units by mouth daily.      [provider]  clonazePAM (KLONOPIN) 0.5 MG tablet TAKE 1 TABLET BY MOUTH AT BEDTIME AS NEEDED FOR LEG CRAMPS 02/05/19   Plotnikov, Evie Lacks, MD  cyanocobalamin 1000 MCG tablet Take 1,000 mcg by mouth daily.    [provider]  flecainide (TAMBOCOR) 50 MG tablet TAKE 1 TABLET(50 MG) BY MOUTH TWICE DAILY 03/28/19   Lelon Perla, MD  Fluticasone-Umeclidin-Vilant (TRELEGY ELLIPTA) 100-62.5-25 MCG/INH AEPB Inhale 1 Inhaler into the lungs daily. 05/06/19   Plotnikov, Evie Lacks, MD  ketoconazole (NIZORAL) 2 % cream APPLY TOPICALLY TO THE AFFECTED AREA DAILY 08/11/19  Plotnikov, Evie Lacks, MD  magic mouthwash w/lidocaine SOLN Take 5 mLs by mouth 4 (four) times daily as needed for mouth pain. Patient not taking: Reported on 07/23/2019 07/10/19   Plotnikov, Evie Lacks, MD  methylPREDNISolone (MEDROL DOSEPAK) 4 MG TBPK tablet As directed 07/23/19   Plotnikov, Evie Lacks, MD  metoprolol tartrate (LOPRESSOR) 25 MG tablet TAKE 1/2 TABLET(12.5 MG) BY MOUTH TWICE DAILY 02/05/19   Plotnikov,  Evie Lacks, MD  pantoprazole (PROTONIX) 40 MG tablet Take 1 tablet (40 mg total) by mouth daily. 02/05/19   Plotnikov, Evie Lacks, MD  tobramycin (TOBREX) 0.3 % ophthalmic solution  11/30/18   [provider]  valACYclovir (VALTREX) 500 MG tablet Take 2 tablets (1,000 mg total) by mouth 2 (two) times daily. 09/16/18   Plotnikov, Evie Lacks, MD     Allergies:    Allergies  Allergen Reactions  . Amoxicillin Other (See Comments)    "makes me crazy" Has patient had a PCN reaction causing immediate rash, facial/tongue/throat swelling, SOB or lightheadedness with hypotension: No Has patient had a PCN reaction causing severe rash involving mucus membranes or skin necrosis: No Has patient had a PCN reaction that required hospitalization: No Has patient had a PCN reaction occurring within the last 10 years: No If all of the above answers are "NO", then may proceed with Cephalosporin use.   . Codeine Nausea And Vomiting  . Levaquin [Levofloxacin In D5w] Nausea And Vomiting  . Other Nausea And Vomiting    Pt does not want to take any pain meds    Social History:   Social History   Socioeconomic History  . Marital status: Divorced    Spouse name: Not on file  . Number of children: 2  . Years of education: Not on file  . Highest education level: Not on file  Occupational History  . Occupation: Airline pilot: [employer]  Tobacco Use  . Smoking status: Former Smoker    Packs/day: 1.00    Types: Cigarettes    Quit date: 2008/05/29    Years since quitting: 11.4  . Smokeless tobacco: Never Used  . Tobacco comment: pt unsure how many years she smoked  Vaping Use  . Vaping Use: Never used  Substance and Sexual Activity  . Alcohol use: No  . Drug use: No  . Sexual activity: Not Currently  Other Topics Concern  . Not on file  Social History Narrative   Retired, works part time   Divorced   Former Smoker   1- Son alcoholic abusive   Daughter died in May 30, 2006   Social Determinants of  Health   Financial Resource Strain:   . Difficulty of Paying Living Expenses:   Food Insecurity:   . Worried About Charity fundraiser in the Last Year:   . Arboriculturist in the Last Year:   Transportation Needs:   . Film/video editor (Medical):   Marland Kitchen Lack of Transportation (Non-Medical):   Physical Activity:   . Days of Exercise per Week:   . Minutes of Exercise per Session:   Stress:   . Feeling of Stress :   Social Connections:   . Frequency of Communication with Friends and Family:   . Frequency of Social Gatherings with Friends and Family:   . Attends Religious Services:   . Active Member of Clubs or Organizations:   . Attends Archivist Meetings:   Marland Kitchen Marital Status:   Intimate Partner Violence:   . Fear  of Current or Ex-Partner:   . Emotionally Abused:   Marland Kitchen Physically Abused:   . Sexually Abused:     Family History:   The patient's family history includes Arthritis in an other family member; Coronary artery disease in an other family member; Diabetes in her daughter; Heart disease in her father; Hypertension in an other family member.    ROS:  Please see the history of present illness.  All other ROS reviewed and negative.     Physical Exam/Data:   Vitals:   08/26/19 1407 08/26/19 1411 08/26/19 1415 08/26/19 1425  BP: (!) 182/62  (!) 188/67 (!) 193/68  Pulse:  (!) 0 62 65  Resp:  (!) 0 16 16  SpO2: 92% (!) 0% 97% 96%   No intake or output data in the 24 hours ending 08/26/19 1433 Last 3 Weights 07/23/2019 05/06/2019 02/05/2019  Weight (lbs) 132 lb 133 lb 138 lb  Weight (kg) 59.875 kg 60.328 kg 62.596 kg     There is no height or weight on file to calculate BMI.  General: Elderly somewhat frail-appearing woman, mild distress secondary to chest discomfort  HEENT: normal Lymph: no adenopathy Neck: no JVD Endocrine:  No thryomegaly Vascular: No carotid bruits; FA pulses 2+ bilaterally Cardiac:  normal S1, S2; RRR; no murmur  Lungs:  clear to  auscultation bilaterally, no wheezing, rhonchi or rales  Abd: soft, nontender, no hepatomegaly  Ext: no edema Musculoskeletal:  No deformities, BUE and BLE strength normal and equal Skin: warm and dry  Neuro:  CNs 2-12 intact, no focal abnormalities noted Psych:  Normal affect    EKG:  The ECG that was done today was personally reviewed and demonstrates sinus rhythm with acute lateral injury pattern, reciprocal changes noted  Relevant CV Studies: Pending  Laboratory Data:  High Sensitivity Troponin:   Recent Labs  Lab 08/26/19 1240  TROPONINIHS 3,863*      Chemistry Recent Labs  Lab 08/26/19 1235 08/26/19 1240  NA 141 142  K 3.6 3.6  CL 108 106  CO2  --  23  GLUCOSE 127* 123*  BUN 21 20  CREATININE 2.00* 1.93*  CALCIUM  --  9.0  GFRNONAA  --  24*  GFRAA  --  28*  ANIONGAP  --  13    Recent Labs  Lab 08/26/19 1240  PROT 6.1*  ALBUMIN 3.1*  AST 32  ALT 12  ALKPHOS 49  BILITOT 0.6   Hematology Recent Labs  Lab 08/26/19 1235 08/26/19 1240  WBC  --  9.9  RBC  --  4.37  HGB 15.0 13.3  HCT 44.0 41.8  MCV  --  95.7  MCH  --  30.4  MCHC  --  31.8  RDW  --  15.9*  PLT  --  355   BNPNo results for input(s): BNP, PROBNP in the last 168 hours.  DDimer No results for input(s): DDIMER in the last 168 hours.   Radiology/Studies:  CARDIAC CATHETERIZATION  Result Date: 08/26/2019  1st Diag lesion is 100% stenosed.  Balloon angioplasty was performed using a BALLOON SAPPHIRE 2.0X12.  Post intervention, there is a 50% residual stenosis.  Mid LAD lesion is 30% stenosed.  1.  Total occlusion of the superior branch of the first diagonal with contrast staining, consistent with infarct vessel, treated with balloon angioplasty alone due to small vessel size and poor distal runoff 2.  Calcified coronary arteries with no significant obstructive disease in the left main, left circumflex, LAD proper,  or RCA 3.  Minimally elevated LVEDP Plan: ICU care, add clopidogrel when  nausea subsides, risk reduction measures as tolerated.  Unclear whether this is a de novo plaque rupture versus an embolic event.  Patient does have known paroxysmal atrial fibrillation and has refused anticoagulation.       TIMI Risk Score for ST  Elevation MI:   The patient's TIMI risk score is 7, which indicates a 23.4% risk of all cause mortality at 30 days.       Assessment and Plan:   1. Acute lateral STEMI 2. Paroxysmal atrial fibrillation 3. COPD 4. Hypertension with CKD stage IV  Discussed severity of illness with the patient.  Difficult situation with her comorbid medical problems, but she remains functionally independent and lives at home by herself.  She no longer drives but she is still able to go to the grocery store with her sister.  With ongoing chest discomfort and ST elevation, we will proceed emergently with cardiac catheterization and possible PCI.  The procedure is explained to the patient.  Emergency implied consent is obtained.  Further plans/disposition pending cardiac catheterization results.  Will be judicious with contrast use in the setting of her chronic kidney disease.  Severity of Illness: The appropriate patient status for this patient is INPATIENT. Inpatient status is judged to be reasonable and necessary in order to provide the required intensity of service to ensure the patient's safety. The patient's presenting symptoms, physical exam findings, and initial radiographic and laboratory data in the context of their chronic comorbidities is felt to place them at high risk for further clinical deterioration. Furthermore, it is not anticipated that the patient will be medically stable for discharge from the hospital within 2 midnights of admission. The following factors support the patient status of inpatient.   " The patient's presenting symptoms include chest pain. " The initial radiographic and laboratory data are worrisome because of STEMI. " The chronic  co-morbidities include COPD, frailty.   * I certify that at the point of admission it is my clinical judgment that the patient will require inpatient hospital care spanning beyond 2 midnights from the point of admission due to high intensity of service, high risk for further deterioration and high frequency of surveillance required.*    For questions or updates, please contact [place] Please consult [email or web address] for contact info under     Signed, Sherren Mocha, MD  08/26/2019 2:33 PM

## 2019-08-27 ENCOUNTER — Encounter (HOSPITAL_COMMUNITY): Payer: Self-pay | Admitting: Cardiovascular Disease

## 2019-08-27 ENCOUNTER — Inpatient Hospital Stay (HOSPITAL_COMMUNITY): Payer: Medicare Other

## 2019-08-27 DIAGNOSIS — E785 Hyperlipidemia, unspecified: Secondary | ICD-10-CM | POA: Diagnosis present

## 2019-08-27 DIAGNOSIS — Z9861 Coronary angioplasty status: Secondary | ICD-10-CM

## 2019-08-27 DIAGNOSIS — I351 Nonrheumatic aortic (valve) insufficiency: Secondary | ICD-10-CM

## 2019-08-27 DIAGNOSIS — J449 Chronic obstructive pulmonary disease, unspecified: Secondary | ICD-10-CM

## 2019-08-27 DIAGNOSIS — I10 Essential (primary) hypertension: Secondary | ICD-10-CM

## 2019-08-27 DIAGNOSIS — I480 Paroxysmal atrial fibrillation: Secondary | ICD-10-CM

## 2019-08-27 LAB — BASIC METABOLIC PANEL
Anion gap: 12 (ref 5–15)
BUN: 19 mg/dL (ref 8–23)
CO2: 24 mmol/L (ref 22–32)
Calcium: 8.6 mg/dL — ABNORMAL LOW (ref 8.9–10.3)
Chloride: 107 mmol/L (ref 98–111)
Creatinine, Ser: 1.82 mg/dL — ABNORMAL HIGH (ref 0.44–1.00)
GFR calc Af Amer: 30 mL/min — ABNORMAL LOW (ref >60)
GFR calc non Af Amer: 26 mL/min — ABNORMAL LOW (ref >60)
Glucose, Bld: 127 mg/dL — ABNORMAL HIGH (ref 70–99)
Potassium: 4.2 mmol/L (ref 3.5–5.1)
Sodium: 143 mmol/L (ref 135–145)

## 2019-08-27 LAB — CBC
HCT: 40.3 % (ref 36.0–46.0)
Hemoglobin: 12.6 g/dL (ref 12.0–15.0)
MCH: 30.4 pg (ref 26.0–34.0)
MCHC: 31.3 g/dL (ref 30.0–36.0)
MCV: 97.3 fL (ref 80.0–100.0)
Platelets: 315 10*3/uL (ref 150–400)
RBC: 4.14 MIL/uL (ref 3.87–5.11)
RDW: 16 % — ABNORMAL HIGH (ref 11.5–15.5)
WBC: 8.7 10*3/uL (ref 4.0–10.5)
nRBC: 0 % (ref 0.0–0.2)

## 2019-08-27 LAB — GLUCOSE, CAPILLARY
Glucose-Capillary: 110 mg/dL — ABNORMAL HIGH (ref 70–99)
Glucose-Capillary: 89 mg/dL (ref 70–99)

## 2019-08-27 LAB — ECHOCARDIOGRAM COMPLETE: Weight: 1989.43 oz

## 2019-08-27 MED ORDER — GUAIFENESIN ER 600 MG PO TB12
1200.0000 mg | ORAL_TABLET | Freq: Two times a day (BID) | ORAL | Status: DC | PRN
  Administered 2019-08-27: 1200 mg via ORAL
  Filled 2019-08-27: qty 2

## 2019-08-27 MED ORDER — CHLORHEXIDINE GLUCONATE CLOTH 2 % EX PADS: 6.0000 | MEDICATED_PAD | Freq: Every day | CUTANEOUS | Status: DC

## 2019-08-27 MED ORDER — CLOPIDOGREL BISULFATE 75 MG PO TABS
225.0000 mg | ORAL_TABLET | Freq: Once | ORAL | Status: AC
  Administered 2019-08-27: 225 mg via ORAL
  Filled 2019-08-27: qty 3

## 2019-08-27 MED ORDER — ROSUVASTATIN CALCIUM 20 MG PO TABS
20.0000 mg | ORAL_TABLET | Freq: Every day | ORAL | Status: DC
  Administered 2019-08-27 – 2019-08-28 (×2): 20 mg via ORAL
  Filled 2019-08-27 (×2): qty 1

## 2019-08-27 MED FILL — Midazolam HCl Inj 2 MG/2ML (Base Equivalent): INTRAMUSCULAR | Qty: 2 | Status: AC

## 2019-08-27 NOTE — Progress Notes (Signed)
  Echocardiogram 2D Echocardiogram with 3D has been performed.  Darlina Sicilian M 08/27/2019, 12:06 PM

## 2019-08-27 NOTE — Plan of Care (Signed)

## 2019-08-27 NOTE — Progress Notes (Signed)
Progress Note  Patient Name: [name] Date of Encounter: 08/27/2019  [facility] Cardiologist: Sherren Mocha, MD    Patient Profile     84 y.o. female with a history of PAF (not on anticoagulation-patient refusal) admitted during the day on 08/26/2019 for acute lateral STEMI -> underwent PTCA only of 1st Diag 100% stenosis (reduced to 50%) -no PCI due to small caliber vessel and minimal downstream flow.  Subjective   No longer having any chest pain.  Major complaint now is intermittent nausea which has gotten better, but also has her daily significant sputum in the morning.  She had some coughing productive of white-ish sputum.  Usually has Mucinex at home.  Has had issues with intermittent diarrhea and constipation, no major complaints right now.  Inpatient Medications    Scheduled Meds: . amLODipine  5 mg Oral Daily  . aspirin  81 mg Oral Daily  . Chlorhexidine Gluconate Cloth  6 each Topical Daily  . clopidogrel  75 mg Oral Q breakfast  . fluticasone furoate-vilanterol  1 puff Inhalation Daily  . metoprolol tartrate  12.5 mg Oral BID  . ondansetron (ZOFRAN) IV  4 mg Intravenous Once  . pantoprazole  40 mg Oral Daily  . rosuvastatin  20 mg Oral Daily  . sodium chloride flush  3 mL Intravenous Q12H  . umeclidinium bromide  1 puff Inhalation Daily   Continuous Infusions: . sodium chloride     PRN Meds: sodium chloride, acetaminophen, clonazePAM, guaiFENesin, magic mouthwash w/lidocaine, ondansetron (ZOFRAN) IV, sodium chloride flush   Vital Signs    Vitals:   08/27/19 1108 08/27/19 1119 08/27/19 1800 08/27/19 2035  BP: (!) 157/72  125/90 (!) 173/82  Pulse: 62  79 92  Resp: 18  20 17   Temp: 97.8 F (36.6 C)  98.3 F (36.8 C) 98 F (36.7 C)  TempSrc: Oral  Oral Oral  SpO2: 93% 97% 95% 91%  Weight:        Intake/Output Summary (Last 24 hours) at 08/27/2019 2139 Last data filed at 08/27/2019 1500 Gross per 24 hour  Intake 5.59 ml  Output 400 ml  Net  -394.41 ml   Last 3 Weights 08/27/2019 08/26/2019 07/23/2019  Weight (lbs) 124 lb 5.4 oz 132 lb 132 lb  Weight (kg) 56.4 kg 59.875 kg 59.875 kg      Telemetry    Actually appears to be in sinus rhythm with PACs., rate controlled personally Reviewed  ECG    Rate 70 bpm.  CRO anterior infarct, age undetermined.  Otherwise relatively normal with no ischemic changes.  Prolonged PR interval- Personally Reviewed  Physical Exam   General appearance: alert, cooperative, no distress and Elderly, frail woman.  Somewhat tired looking. Neck: no adenopathy, no carotid bruit, no JVD and thyroid not enlarged, symmetric, no tenderness/mass/nodules Lungs: normal percussion bilaterally and Mild diffuse crackles with no rales or rhonchi.  Crackles clear with cough. Heart: regular rate and rhythm, S1, S2 normal and Distant heart sounds.  No M/R/G Abdomen: soft, non-tender; bowel sounds normal; no masses,  no organomegaly Extremities: extremities normal, atraumatic, no cyanosis or edema Pulses: 2+ and symmetric Neurologic: Mental status: Alert, oriented, thought content appropriate, Very hard of hearing.  Makes it very difficult to fully understand her mental status.  Although she does seem to be clear when speaking   Labs    High Sensitivity Troponin:   Recent Labs  Lab 08/26/19 1240  TROPONINIHS 3,863*      Chemistry Recent Labs  Lab 08/26/19 1235 08/26/19 1240 08/27/19 0229  NA 141 142 143  K 3.6 3.6 4.2  CL 108 106 107  CO2  --  23 24  GLUCOSE 127* 123* 127*  BUN 21 20 19   CREATININE 2.00* 1.93* 1.82*  CALCIUM  --  9.0 8.6*  PROT  --  6.1*  --   ALBUMIN  --  3.1*  --   AST  --  32  --   ALT  --  12  --   ALKPHOS  --  49  --   BILITOT  --  0.6  --   GFRNONAA  --  24* 26*  GFRAA  --  28* 30*  ANIONGAP  --  13 12     Hematology Recent Labs  Lab 08/26/19 1235 08/26/19 1240 08/27/19 0229  WBC  --  9.9 8.7  RBC  --  4.37 4.14  HGB 15.0 13.3 12.6  HCT 44.0 41.8 40.3  MCV   --  95.7 97.3  MCH  --  30.4 30.4  MCHC  --  31.8 31.3  RDW  --  15.9* 16.0*  PLT  --  355 315    BNP Recent Labs  Lab 08/26/19 1240  BNP 1,086.4*     DDimer No results for input(s): DDIMER in the last 168 hours.   Radiology & Cardiac Studies    CARDIAC CATHETERIZATION  Result Date: 08/27/2019  1st Diag lesion is 100% stenosed.  Balloon angioplasty was performed using a BALLOON SAPPHIRE 2.0X12.  Post intervention, there is a 50% residual stenosis.  Mid LAD lesion is 30% stenosed.  1.  Total occlusion of the superior branch of the first diagonal with contrast staining, consistent with infarct vessel, treated with balloon angioplasty alone due to small vessel size and poor distal runoff 2.  Calcified coronary arteries with no significant obstructive disease in the left main, left circumflex, LAD proper, or RCA 3.  Minimally elevated LVEDP Plan: ICU care, add clopidogrel when nausea subsides, risk reduction measures as tolerated.  Unclear whether this is a de novo plaque rupture versus an embolic event.  Patient does have known paroxysmal atrial fibrillation and has refused anticoagulation.   DG Chest Portable 1 View  Result Date: 08/26/2019 CLINICAL DATA:  Chest pain. EXAM: PORTABLE CHEST 1 VIEW COMPARISON:  12/19/2018, chest CT 01/01/2017 FINDINGS: Emphysema with chronic hyperinflation. Cardiomegaly with slight progression from prior exam. Unchanged mediastinal contours with aortic atherosclerosis. Suspected small bilateral pleural effusions. No focal airspace disease. There is biapical pleuroparenchymal scarring. No pneumothorax. Bones are diffusely under mineralized. No acute osseous abnormalities are seen. IMPRESSION: 1. Emphysema with chronic hyperinflation. 2. Cardiomegaly with slight progression from prior exam. Suspected small bilateral pleural effusions. Aortic Atherosclerosis (ICD10-I70.0) and Emphysema (ICD10-J43.9). Electronically Signed   By: Keith Rake M.D.   On:  08/26/2019 20:04   ECHOCARDIOGRAM COMPLETE  Result Date: 08/27/2019    ECHOCARDIOGRAM REPORT   Patient Name:   [name] Date of Exam: 08/27/2019 Medical Rec #:  [id number]     Height:       64.0 in Accession #:    [id number]    Weight:       124.3 lb Date of Birth:  May 27, 1935     BSA:          1.599 m Patient Age:    67 years      BP:           158/68 mmHg Patient Gender: F  HR:           67 bpm. Exam Location:  Inpatient Procedure: 2D Echo and 3D Echo Indications:    Acute Cornonary Syndrome I24.9  History:        Patient has prior history of Echocardiogram examinations, most                 recent 09/17/2017. CAD and Acute MI, COPD, Arrythmias:Atrial                 Fibrillation; Risk Factors:Former Smoker.  Sonographer:    Darlina Sicilian RDCS Referring Phys: [place]  1. Left ventricular ejection fraction, by estimation, is 60 to 65%. The left ventricle has normal function. Left ventricular endocardial border not optimally defined to evaluate regional wall motion. There is mild concentric left ventricular hypertrophy. Left ventricular diastolic function could not be evaluated.  2. Right ventricular systolic function is normal. The right ventricular size is normal.  3. Left atrial size was moderately dilated.  4. The mitral valve is normal in structure. No evidence of mitral valve regurgitation. No evidence of mitral stenosis.  5. The aortic valve is tricuspid. Aortic valve regurgitation is mild. FINDINGS  Left Ventricle: Left ventricular ejection fraction, by estimation, is 60 to 65%. The left ventricle has normal function. Left ventricular endocardial border not optimally defined to evaluate regional wall motion. The left ventricular internal cavity size was normal in size. There is mild concentric left ventricular hypertrophy. Left ventricular diastolic function could not be evaluated due to atrial fibrillation. Left ventricular diastolic function could not be evaluated.  Right Ventricle: The right ventricular size is normal. No increase in right ventricular wall thickness. Right ventricular systolic function is normal. Left Atrium: Left atrial size was moderately dilated. Right Atrium: Right atrial size was normal in size. Pericardium: There is no evidence of pericardial effusion. Mitral Valve: The mitral valve is normal in structure. No evidence of mitral valve regurgitation. No evidence of mitral valve stenosis. Tricuspid Valve: The tricuspid valve is grossly normal. Tricuspid valve regurgitation is not demonstrated. No evidence of tricuspid stenosis. Aortic Valve: The aortic valve is tricuspid. . There is mild thickening and mild calcification of the aortic valve. Aortic valve regurgitation is mild. There is mild thickening of the aortic valve. There is mild calcification of the aortic valve. Pulmonic Valve: The pulmonic valve was normal in structure. Pulmonic valve regurgitation is trivial. Aorta: The aortic root and ascending aorta are structurally normal, with no evidence of dilitation. IAS/Shunts: The atrial septum is grossly normal.  LEFT VENTRICLE PLAX 2D LVIDd:         3.90 cm     Diastology LVIDs:         2.80 cm     LV e' lateral:   4.68 cm/s LV PW:         1.30 cm     LV E/e' lateral: 14.2 LV IVS:        1.20 cm     LV e' medial:    4.43 cm/s LVOT diam:     2.00 cm     LV E/e' medial:  15.0 LV SV:         46 LV SV Index:   29 LVOT Area:     3.14 cm  LV Volumes (MOD) LV vol d, MOD A2C: 85.4 ml LV vol d, MOD A4C: 79.7 ml LV vol s, MOD A2C: 39.9 ml LV vol s, MOD A4C: 39.8 ml LV SV MOD  A2C:     45.5 ml LV SV MOD A4C:     79.7 ml LV SV MOD BP:      46.0 ml RIGHT VENTRICLE RV S prime:     8.63 cm/s TAPSE (M-mode): 2.0 cm LEFT ATRIUM             Index       RIGHT ATRIUM           Index LA diam:        3.60 cm 2.25 cm/m  RA Area:     15.10 cm LA Vol (A2C):   52.8 ml 33.03 ml/m RA Volume:   36.00 ml  22.52 ml/m LA Vol (A4C):   86.9 ml 54.36 ml/m LA Biplane Vol: 69.0 ml  43.16 ml/m  AORTIC VALVE LVOT Vmax:   69.50 cm/s LVOT Vmean:  54.800 cm/s LVOT VTI:    0.148 m  AORTA Ao Root diam: 3.10 cm Ao Asc diam:  3.20 cm MITRAL VALVE MV Area (PHT): 4.68 cm    SHUNTS MV Decel Time: 162 msec    Systemic VTI:  0.15 m MV E velocity: 66.50 cm/s  Systemic Diam: 2.00 cm MV A velocity: 31.50 cm/s MV E/A ratio:  2.11 Mertie Moores MD Electronically signed by Mertie Moores MD Signature Date/Time: 08/27/2019/3:22:12 PM    Final      Assessment & Plan    Principal Problem:   Acute ST elevation myocardial infarction (STEMI) of lateral wall (HCC) Active Problems:   CAD S/P percutaneous coronary angioplasty   Paroxysmal atrial fibrillation (HCC)   Hyperlipidemia with target LDL less than 70   Essential hypertension   COPD with chronic bronchitis (HCC)  Principal Problem:   Acute ST elevation myocardial infarction (STEMI) of lateral wall (HCC)=- w/  CAD S/P percutaneous coronary angioplasty  Relatively small caliber diagonal branch was occluded.  Successful PTCA.    Is currently on aspirin and Plavix along with atorvastatin and low-dose beta-blocker.  She did have elevated proBNP level.  No PND orthopnea.  We will hold off on diuretic for now.    Paroxysmal atrial fibrillation (HCC)  Actually seems to be maintaining sinus rhythm.  Continue low-dose beta-blocker.    Essential hypertension  Continue amlodipine and metoprolol.  With renal insufficiency, we are holding ARB    Hyperlipidemia with target LDL less than 70  With clear evidence of CAD, will start statin, however with advanced age will use low-dose rosuvastatin 20 mg daily.    COPD with chronic bronchitis ([place]) -> inpatient substitution of home standing inhaler along with as needed nebulizers.  Would add Mucinex based on phlegm  Echocardiogram shows normal EF.  Would like to see if we can potentially discharge her tomorrow.  Other coronaries look relatively normal.  As long as blood pressure stable and she  is asymptomatic from angina or heart failure standpoint, anticipate discharge tomorrow.   For questions or updates, please contact [place] Please consult [email or web address] for contact info under        Signed, Glenetta Hew, MD  08/27/2019, 9:39 PM

## 2019-08-27 NOTE — Progress Notes (Signed)
CARDIAC REHAB PHASE I   PRE:  Rate/Rhythm: 73 SR    BP: sitting 164/49    SaO2: 93 2L  MODE:  Ambulation: to sink   POST:  Rate/Rhythm: 73 SR    BP: sitting 157/72     SaO2: 93 2L  Pt eager to walk but upon standing c/o nausea and belching. She was unsteady in room, belching intermittently and did endorse dizziness. Sat and rested at sink then returned to room. She sts this has been happening to her. She also c/o phlegm that makes her feel constricted. Tried RA with decreased SAO2 to 87-88 RA. Reapplied O2 for now. Left in recliner with MI book on chair alarm. Pt asking for med for mucous. RN also sts she got protonix this morning. Pt refusing zophran. [place], ACSM 08/27/2019 11:25 AM

## 2019-08-27 NOTE — Progress Notes (Signed)
Patient arrived from [facility] to 4E room 7. CHG bath completed and new gown applied. Tele applied and CCMD notified. Patient oriented to room, bed and call light. Will continue to monitor. Adella Hare, RN

## 2019-08-28 ENCOUNTER — Telehealth (HOSPITAL_COMMUNITY): Payer: Self-pay

## 2019-08-28 LAB — BASIC METABOLIC PANEL
Anion gap: 9 (ref 5–15)
BUN: 17 mg/dL (ref 8–23)
CO2: 25 mmol/L (ref 22–32)
Calcium: 8.7 mg/dL — ABNORMAL LOW (ref 8.9–10.3)
Chloride: 101 mmol/L (ref 98–111)
Creatinine, Ser: 1.67 mg/dL — ABNORMAL HIGH (ref 0.44–1.00)
GFR calc Af Amer: 33 mL/min — ABNORMAL LOW (ref >60)
GFR calc non Af Amer: 28 mL/min — ABNORMAL LOW (ref >60)
Glucose, Bld: 140 mg/dL — ABNORMAL HIGH (ref 70–99)
Potassium: 4.4 mmol/L (ref 3.5–5.1)
Sodium: 135 mmol/L (ref 135–145)

## 2019-08-28 MED ORDER — ROSUVASTATIN CALCIUM 20 MG PO TABS: 20.0000 mg | ORAL_TABLET | Freq: Every day | ORAL | 2 refills | Status: DC

## 2019-08-28 MED ORDER — METOPROLOL TARTRATE 25 MG PO TABS: 25.0000 mg | ORAL_TABLET | Freq: Two times a day (BID) | ORAL | Status: DC

## 2019-08-28 MED ORDER — CLOPIDOGREL BISULFATE 75 MG PO TABS: 75.0000 mg | ORAL_TABLET | Freq: Every day | ORAL | 2 refills | Status: DC

## 2019-08-28 MED ORDER — NITROGLYCERIN 0.4 MG SL SUBL
0.4000 mg | SUBLINGUAL_TABLET | SUBLINGUAL | 0 refills | Status: AC | PRN
Start: 2019-08-28 — End: 2020-08-27

## 2019-08-28 MED ORDER — ASPIRIN 81 MG PO CHEW: 81.0000 mg | CHEWABLE_TABLET | Freq: Every day | ORAL | Status: DC

## 2019-08-28 MED FILL — NITROGLYCERIN 0.4 MG TAB SL: 0.4 | 7 days supply | Qty: 25 | Fill #0

## 2019-08-28 MED FILL — CLOPIDOGREL 75 MG TABLET: 75 | 60 days supply | Qty: 60 | Fill #0

## 2019-08-28 MED FILL — ROSUVASTATIN CALCIUM 20 MG: 20 | 60 days supply | Qty: 60 | Fill #0

## 2019-08-28 NOTE — Progress Notes (Signed)
CARDIAC REHAB PHASE I   PRE:  Rate/Rhythm: 76 SR with PACs    BP: sitting 143/97    SaO2: 93 2L, 89 RA in chair   MODE:  Ambulation: 280 ft   POST:  Rate/Rhythm: 99 SR with PACs    BP: sitting 126/85     SaO2: 87 3L  Pt feels better today. Able to stand independently, no belching or dizziness. Slightly unsteady but declined RW. SaO2 85 RA at door. Applied 2L, then 3L while walking. SAO2 87 3L at highest. Denies SOB or CP. Return to recliner.  Pt lives alone and admits she struggles with her meds. Sts she uses a pill box but drops them on the floor sometimes. She asks about getting them pre-packaged. Her short term memory struggles as she does not remember eating breakfast today and asks for more. She had similar issues yesterday. Most of family is deceased. Her sister helps some but they do not get along. Pt asks about ALF. She will benefit from [facility] and HHPT atleast initially. Some cognition might be hospital related. Discussed importance of meds. Will refer to [facility] but does not have transport or smart phone.  SATURATION QUALIFICATIONS: (This note is used to comply with regulatory documentation for home oxygen)  Patient Saturations on Room Air at Rest = 89%  Patient Saturations on Room Air while Ambulating = 85%  Patient Saturations on 3 Liters of oxygen while Ambulating = 87%  Please briefly explain why patient needs home oxygen: see above [id number]  [place], ACSM 08/28/2019 10:44 AM

## 2019-08-28 NOTE — Telephone Encounter (Signed)
Per phase I do not call pt for CR phase II, pt not able to participate.  Closed referral

## 2019-08-28 NOTE — TOC Transition Note (Signed)
Transition of Care [facility]) - CM/SW Discharge Note Marvetta Gibbons RN, BSN Transitions of Care Unit 4E- RN Case Manager [phone]   Patient Details  Name: [name] MRN: [id number] Date of Birth: 1936-02-27  Transition of Care [facility]) CM/SW Contact:  Dawayne Patricia, RN Phone Number: 08/28/2019, 12:53 PM   Clinical Narrative:    Pt stable for transition home today, orders placed for new home 02 and HHRN/PT. Spoke with pt at bedside-regarding transition needs for home. Per pt her sister will come transport home. Patient states she does not really want to "haul those tanks" around- explained the MD wanted her to be safe at home and for now she needed the oxygen- pt agreeable to home 02 arrangements- will arrange with [employer] and have portable tank delivered to room for transport home.  Discussed with pt HH- list provided for [facility] choice- Per CMS guidelines from medicare.gov website with star ratings (copy placed in shadow chart)-  Per pt she does not have a preference and will leave it to CM to find an agency to provide the services- pt does not feel like she will need a bath aide.  Address, phone # and PCP all confirmed with pt in epic.   Call made to [facility] with [employer] for home 02 needs- portable tank to be delivered to room once approved by insurance.   Call made to [facility] with [facility] for [facility] needs they can accept referral for HHRN/PT (no aide)-    Final next level of care: Home w Home Health Services Barriers to Discharge: No Barriers Identified   Patient Goals and CMS Choice Patient states their goals for this hospitalization and ongoing recovery are:: to be home CMS Medicare.gov Compare Post Acute Care list provided to:: Patient Choice offered to / list presented to : Patient  Discharge Placement                 home with [facility]      Discharge Plan and Services   Discharge Planning Services: CM Consult Post Acute Care Choice: Durable Medical Equipment          DME Arranged:  Oxygen DME Agency: [employer] Date DME Agency Contacted: 08/28/19 Time DME Agency Contacted: 1200 Representative spoke with at DME Agency: [place]: RN, PT [place] Agency: [place] ([place]) Date [place]: 08/28/19 Time [place]: 1252 Representative spoke with at [place]: [place] ([place]) Interventions     Readmission Risk Interventions Readmission Risk Prevention Plan 08/28/2019  Transportation Screening Complete  PCP or Specialist Appt within 5-7 Days Complete  Home Care Screening Complete  Medication Review (RN CM) Complete  Some recent data might be hidden

## 2019-08-28 NOTE — Progress Notes (Signed)
Discharge instructions given to patient. IV removed, clean and intact. DME oxygen delivered. Medications reviewed. All questions answered. Pt escorted home with sister.  Arletta Bale, RN

## 2019-08-28 NOTE — Discharge Summary (Signed)
Discharge Summary    Patient ID: [name] MRN: [id number]; DOB: 05/04/1935  Admit date: 08/26/2019 Discharge date: 08/28/2019  Primary Care Provider: Cassandria Anger, MD  Primary Cardiologist: Dr. Kirk Ruths, MD   Discharge Diagnoses    Principal Problem:   Acute ST elevation myocardial infarction (STEMI) of lateral wall [facility]) Active Problems:   Essential hypertension   Paroxysmal atrial fibrillation (HCC)   COPD with chronic bronchitis (HCC)   CAD S/P percutaneous coronary angioplasty   Hyperlipidemia with target LDL less than 70  Diagnostic Studies/Procedures    LHC 08/26/19:   1st Diag lesion is 100% stenosed.  Balloon angioplasty was performed using a BALLOON SAPPHIRE 2.0X12.  Post intervention, there is a 50% residual stenosis.  Mid LAD lesion is 30% stenosed.   1.  Total occlusion of the superior branch of the first diagonal with contrast staining, consistent with infarct vessel, treated with balloon angioplasty alone due to small vessel size and poor distal runoff 2.  Calcified coronary arteries with no significant obstructive disease in the left main, left circumflex, LAD proper, or RCA 3.  Minimally elevated LVEDP  Plan: ICU care, add clopidogrel when nausea subsides, risk reduction measures as tolerated.  Unclear whether this is a de novo plaque rupture versus an embolic event.  Patient does have known paroxysmal atrial fibrillation and has refused anticoagulation.  Echocardiogram 08/27/19:  1. Left ventricular ejection fraction, by estimation, is 60 to 65%. The  left ventricle has normal function. Left ventricular endocardial border  not optimally defined to evaluate regional wall motion. There is mild  concentric left ventricular  hypertrophy. Left ventricular diastolic function could not be evaluated.  2. Right ventricular systolic function is normal. The right ventricular  size is normal.  3. Left atrial size was moderately dilated.  4.  The mitral valve is normal in structure. No evidence of mitral valve  regurgitation. No evidence of mitral stenosis.  5. The aortic valve is tricuspid. Aortic valve regurgitation is mild.   History of Present Illness     [name] is a 84 y.o. female with history of PAF (not on anticoagulation-patient refusal), CKD Stage III who was admitted 08/26/2019 for acute lateral STEMI and underwent PTCA only of 1st Diag 100% stenosis (reduced to 50%) (no PCI due to small caliber vessel and minimal downstream flow).   [name] was in her normal state of health until the morning of presentation when she developed severe substernal chest pain and nausea with weakness. She felt poorly throughout the morning and ultimately called EMS.  On their arrival EKG demonstrates lateral STEMI pattern and a code STEMI was activated.  On arrival to the emergency department, she continued to have discomfort in her chest, feeling lightheaded with complaints of nausea.  She had some shortness of breath as well.  The patient has COPD but is not on home O2.  Other than dizziness and shortness of breath, she denied any other acute symptoms  Hospital Course   Pt taken emergently to the cath lab 08/26/19 which showed total occlusion of the superior branch of the first diagonal with contrast staining, consistent with infarct vessel, treated with balloon angioplasty alone due to small vessel size and poor distal runoff. Calcified coronary arteries with no significant obstructive disease in the left main, left circumflex, LAD proper, or RCA. Minimally elevated LVEDP. Plan: Addition of clopidogrel. Unclear whether this is a de novo plaque rupture versus an embolic event.  Patient does have known paroxysmal  atrial fibrillation and has refused anticoagulation.  Echocardiogram completed with normal EF at 60-65% with sub-optimal view for wall motion assessment. MIld LVH and no valvular disease.   Creatinine elevated but appears to be at  her more recent baseline of 1.5-2.0. Creatinine 08/27/19 1.82. Peak hsT 3863. LDL 154. HbA1c, 6.3.   While ambulating with cardiac rehabilitation, O2 saturations dropped to 87% therefore will plan for supplemental home oxygen at discharge, [facility] problems include:  Acute ST elevation myocardial infarction (STEMI) of lateral wall ([place]): s/p percutaneous coronary angioplasty 08/26/19 give relatively small caliber diagonal branch was occluded. Continue aspirin and Plavix along with atorvastatin and low-dose beta-blocker. She did have elevated proBNP level. We will hold off on diuretic for now however may need to address in the OP setting   Paroxysmal atrial fibrillation [facility]): Actually seems to be maintaining sinus rhythm. Continue low-dose beta-blocker.  Essential hypertension: Continue amlodipine and metoprolol.  With renal insufficiency, we are holding ARB  Hyperlipidemia with target LDL less than 70: With clear evidence of CAD, statin initiated however with advanced age will use low-dose rosuvastatin 20 mg daily  COPD with chronic bronchitis ([place]): Restart home medications. Will send home with supplemental home oxygen. Will need to follow closely with PCP versus pulmonary    Consultants: None    The patient was seen and examined by Dr. Ellyn Hack who feels that she is stable and ready for discharge today, 08/28/19.    Did the patient have an acute coronary syndrome (MI, NSTEMI, STEMI, etc) this admission?:  Yes                               AHA/ACC Clinical Performance & Quality Measures: 1. Aspirin prescribed? - Yes 2. ADP Receptor Inhibitor (Plavix/Clopidogrel, Brilinta/Ticagrelor or Effient/Prasugrel) prescribed (includes medically managed patients)? - Yes 3. Beta Blocker prescribed? - Yes 4. High Intensity Statin (Lipitor 40-80mg  or Crestor 20-40mg ) prescribed? - No - low dose due to age per MD 5. EF assessed during THIS hospitalization? - Yes 6. For EF <40%, was  ACEI/ARB prescribed? - Not Applicable (EF >/= 67%) 7. For EF <40%, Aldosterone Antagonist (Spironolactone or Eplerenone) prescribed? - Not Applicable (EF >/= 12%) 8. Cardiac Rehab Phase II ordered (including medically managed patients)? - Yes   _____________  Discharge Vitals Blood pressure (!) 150/80, pulse 86, temperature (!) 97.5 F (36.4 C), temperature source Oral, resp. rate 17, weight 56.4 kg, SpO2 95 %.  Filed Weights   08/26/19 1800 08/27/19 0410  Weight: 59.9 kg 56.4 kg    Labs & Radiologic Studies    CBC Recent Labs    08/26/19 1240 08/27/19 0229  WBC 9.9 8.7  NEUTROABS 5.7  --   HGB 13.3 12.6  HCT 41.8 40.3  MCV 95.7 97.3  PLT 355 458   Basic Metabolic Panel Recent Labs    08/26/19 1240 08/27/19 0229  NA 142 143  K 3.6 4.2  CL 106 107  CO2 23 24  GLUCOSE 123* 127*  BUN 20 19  CREATININE 1.93* 1.82*  CALCIUM 9.0 8.6*  MG 1.9  --    Liver Function Tests Recent Labs    08/26/19 1240  AST 32  ALT 12  ALKPHOS 49  BILITOT 0.6  PROT 6.1*  ALBUMIN 3.1*   No results for input(s): LIPASE, AMYLASE in the last 72 hours. High Sensitivity Troponin:   Recent Labs  Lab 08/26/19 1240  TROPONINIHS 3,863*  BNP Invalid input(s): POCBNP D-Dimer No results for input(s): DDIMER in the last 72 hours. Hemoglobin A1C Recent Labs    08/26/19 1240  HGBA1C 6.3*   Fasting Lipid Panel Recent Labs    08/26/19 1240  CHOL 239*  HDL 54  LDLCALC 154*  TRIG 155*  CHOLHDL 4.4   Thyroid Function Tests No results for input(s): TSH, T4TOTAL, T3FREE, THYROIDAB in the last 72 hours.  Invalid input(s): FREET3 _____________  CARDIAC CATHETERIZATION  Result Date: 08/27/2019  1st Diag lesion is 100% stenosed.  Balloon angioplasty was performed using a BALLOON SAPPHIRE 2.0X12.  Post intervention, there is a 50% residual stenosis.  Mid LAD lesion is 30% stenosed.  1.  Total occlusion of the superior branch of the first diagonal with contrast staining,  consistent with infarct vessel, treated with balloon angioplasty alone due to small vessel size and poor distal runoff 2.  Calcified coronary arteries with no significant obstructive disease in the left main, left circumflex, LAD proper, or RCA 3.  Minimally elevated LVEDP Plan: ICU care, add clopidogrel when nausea subsides, risk reduction measures as tolerated.  Unclear whether this is a de novo plaque rupture versus an embolic event.  Patient does have known paroxysmal atrial fibrillation and has refused anticoagulation.   DG Chest Portable 1 View  Result Date: 08/26/2019 CLINICAL DATA:  Chest pain. EXAM: PORTABLE CHEST 1 VIEW COMPARISON:  12/19/2018, chest CT 01/01/2017 FINDINGS: Emphysema with chronic hyperinflation. Cardiomegaly with slight progression from prior exam. Unchanged mediastinal contours with aortic atherosclerosis. Suspected small bilateral pleural effusions. No focal airspace disease. There is biapical pleuroparenchymal scarring. No pneumothorax. Bones are diffusely under mineralized. No acute osseous abnormalities are seen. IMPRESSION: 1. Emphysema with chronic hyperinflation. 2. Cardiomegaly with slight progression from prior exam. Suspected small bilateral pleural effusions. Aortic Atherosclerosis (ICD10-I70.0) and Emphysema (ICD10-J43.9). Electronically Signed   By: Keith Rake M.D.   On: 08/26/2019 20:04   ECHOCARDIOGRAM COMPLETE  Result Date: 08/27/2019    ECHOCARDIOGRAM REPORT   Patient Name:   [name] Date of Exam: 08/27/2019 Medical Rec #:  [id number]     Height:       64.0 in Accession #:    [id number]    Weight:       124.3 lb Date of Birth:  1935/04/14     BSA:          1.599 m Patient Age:    23 years      BP:           158/68 mmHg Patient Gender: F             HR:           67 bpm. Exam Location:  Inpatient Procedure: 2D Echo and 3D Echo Indications:    Acute Cornonary Syndrome I24.9  History:        Patient has prior history of Echocardiogram examinations, most                  recent 09/17/2017. CAD and Acute MI, COPD, Arrythmias:Atrial                 Fibrillation; Risk Factors:Former Smoker.  Sonographer:    Darlina Sicilian RDCS Referring Phys: [place]  1. Left ventricular ejection fraction, by estimation, is 60 to 65%. The left ventricle has normal function. Left ventricular endocardial border not optimally defined to evaluate regional wall motion. There is mild concentric left ventricular hypertrophy. Left ventricular diastolic function could not  be evaluated.  2. Right ventricular systolic function is normal. The right ventricular size is normal.  3. Left atrial size was moderately dilated.  4. The mitral valve is normal in structure. No evidence of mitral valve regurgitation. No evidence of mitral stenosis.  5. The aortic valve is tricuspid. Aortic valve regurgitation is mild. FINDINGS  Left Ventricle: Left ventricular ejection fraction, by estimation, is 60 to 65%. The left ventricle has normal function. Left ventricular endocardial border not optimally defined to evaluate regional wall motion. The left ventricular internal cavity size was normal in size. There is mild concentric left ventricular hypertrophy. Left ventricular diastolic function could not be evaluated due to atrial fibrillation. Left ventricular diastolic function could not be evaluated. Right Ventricle: The right ventricular size is normal. No increase in right ventricular wall thickness. Right ventricular systolic function is normal. Left Atrium: Left atrial size was moderately dilated. Right Atrium: Right atrial size was normal in size. Pericardium: There is no evidence of pericardial effusion. Mitral Valve: The mitral valve is normal in structure. No evidence of mitral valve regurgitation. No evidence of mitral valve stenosis. Tricuspid Valve: The tricuspid valve is grossly normal. Tricuspid valve regurgitation is not demonstrated. No evidence of tricuspid stenosis. Aortic  Valve: The aortic valve is tricuspid. . There is mild thickening and mild calcification of the aortic valve. Aortic valve regurgitation is mild. There is mild thickening of the aortic valve. There is mild calcification of the aortic valve. Pulmonic Valve: The pulmonic valve was normal in structure. Pulmonic valve regurgitation is trivial. Aorta: The aortic root and ascending aorta are structurally normal, with no evidence of dilitation. IAS/Shunts: The atrial septum is grossly normal.  LEFT VENTRICLE PLAX 2D LVIDd:         3.90 cm     Diastology LVIDs:         2.80 cm     LV e' lateral:   4.68 cm/s LV PW:         1.30 cm     LV E/e' lateral: 14.2 LV IVS:        1.20 cm     LV e' medial:    4.43 cm/s LVOT diam:     2.00 cm     LV E/e' medial:  15.0 LV SV:         46 LV SV Index:   29 LVOT Area:     3.14 cm  LV Volumes (MOD) LV vol d, MOD A2C: 85.4 ml LV vol d, MOD A4C: 79.7 ml LV vol s, MOD A2C: 39.9 ml LV vol s, MOD A4C: 39.8 ml LV SV MOD A2C:     45.5 ml LV SV MOD A4C:     79.7 ml LV SV MOD BP:      46.0 ml RIGHT VENTRICLE RV S prime:     8.63 cm/s TAPSE (M-mode): 2.0 cm LEFT ATRIUM             Index       RIGHT ATRIUM           Index LA diam:        3.60 cm 2.25 cm/m  RA Area:     15.10 cm LA Vol (A2C):   52.8 ml 33.03 ml/m RA Volume:   36.00 ml  22.52 ml/m LA Vol (A4C):   86.9 ml 54.36 ml/m LA Biplane Vol: 69.0 ml 43.16 ml/m  AORTIC VALVE LVOT Vmax:   69.50 cm/s LVOT Vmean:  54.800 cm/s LVOT VTI:  0.148 m  AORTA Ao Root diam: 3.10 cm Ao Asc diam:  3.20 cm MITRAL VALVE MV Area (PHT): 4.68 cm    SHUNTS MV Decel Time: 162 msec    Systemic VTI:  0.15 m MV E velocity: 66.50 cm/s  Systemic Diam: 2.00 cm MV A velocity: 31.50 cm/s MV E/A ratio:  2.11 Mertie Moores MD Electronically signed by Mertie Moores MD Signature Date/Time: 08/27/2019/3:22:12 PM    Final    Disposition   Pt is being discharged home today in good condition.  Follow-up Plans & Appointments    Follow-up Information    Deberah Pelton, NP Follow up on 09/10/2019.   Specialty: Cardiology Why: 10:15am  Contact information: [address] [place] [address] [phone]              Discharge Instructions    Amb Referral to Cardiac Rehabilitation   Complete by: As directed    Diagnosis:  STEMI PTCA     After initial evaluation and assessments completed: Virtual Based Care may be provided alone or in conjunction with Phase 2 Cardiac Rehab based on patient barriers.: Yes   Call MD for:  difficulty breathing, headache or visual disturbances   Complete by: As directed    Call MD for:  extreme fatigue   Complete by: As directed    Call MD for:  hives   Complete by: As directed    Call MD for:  persistant dizziness or light-headedness   Complete by: As directed    Call MD for:  persistant nausea and vomiting   Complete by: As directed    Call MD for:  redness, tenderness, or signs of infection (pain, swelling, redness, odor or green/yellow discharge around incision site)   Complete by: As directed    Call MD for:  severe uncontrolled pain   Complete by: As directed    Call MD for:  temperature >100.4   Complete by: As directed    Diet - low sodium heart healthy   Complete by: As directed    Discharge instructions   Complete by: As directed    PLEASE DO NOT [place]!!!!! Also keep a log of you blood pressures and bring back to your follow up appt. Please call the office with any questions.   Patients taking blood thinners should generally stay away from medicines like ibuprofen, Advil, Motrin, naproxen, and Aleve due to risk of stomach bleeding. You may take Tylenol as directed or talk to your primary doctor about alternatives.  Some studies suggest Prilosec/Omeprazole interacts with Plavix. If you have reflux symptoms, please use Protonix for less chance of interaction.   No driving for 1 week. No lifting over 5 lbs for 1 week. No sexual activity for 1 week. Keep procedure  site clean & dry. If you notice increased pain, swelling, bleeding or pus, call/return!  You may shower, but no soaking baths/hot tubs/pools for 1 week.   Increase activity slowly   Complete by: As directed      Discharge Medications   Allergies as of 08/28/2019      Reactions   Amoxicillin Other (See Comments)   "makes me crazy" Has patient had a PCN reaction causing immediate rash, facial/tongue/throat swelling, SOB or lightheadedness with hypotension: No Has patient had a PCN reaction causing severe rash involving mucus membranes or skin necrosis: No Has patient had a PCN reaction that required hospitalization: No Has patient had a PCN reaction occurring  within the last 10 years: No If all of the above answers are "NO", then may proceed with Cephalosporin use.   Codeine Nausea And Vomiting   Levaquin [levofloxacin In D5w] Nausea And Vomiting   Other Nausea And Vomiting   Pt does not want to take any pain meds      Medication List    STOP taking these medications   BC HEADACHE POWDER PO   flecainide 50 MG tablet Commonly known as: TAMBOCOR   methylPREDNISolone 4 MG Tbpk tablet Commonly known as: MEDROL DOSEPAK   valACYclovir 500 MG tablet Commonly known as: Valtrex     TAKE these medications   amLODipine 5 MG tablet Commonly known as: NORVASC TAKE 1 TABLET(5 MG) BY MOUTH DAILY   aspirin 81 MG chewable tablet Chew 1 tablet (81 mg total) by mouth daily. Start taking on: August 29, 2019   Biotin 1000 MCG tablet Take 1,000 mcg by mouth daily.   clonazePAM 0.5 MG tablet Commonly known as: KLONOPIN TAKE 1 TABLET BY MOUTH AT BEDTIME AS NEEDED FOR LEG CRAMPS What changed:   how much to take  how to take this  when to take this  reasons to take this  additional instructions   clopidogrel 75 MG tablet Commonly known as: PLAVIX Take 1 tablet (75 mg total) by mouth daily with breakfast. Start taking on: August 29, 2019   [employer] Vitamin D3 25 MCG (1000 UT)  capsule Generic drug: Cholecalciferol Take 1,000 Units by mouth daily.   cyanocobalamin 1000 MCG tablet Take 1,000 mcg by mouth daily.   ketoconazole 2 % cream Commonly known as: NIZORAL APPLY TOPICALLY TO THE AFFECTED AREA DAILY What changed: See the new instructions.   magic mouthwash w/lidocaine Soln Take 5 mLs by mouth 4 (four) times daily as needed for mouth pain.   metoprolol tartrate 25 MG tablet Commonly known as: LOPRESSOR TAKE 1/2 TABLET(12.5 MG) BY MOUTH TWICE DAILY What changed:   how much to take  how to take this  when to take this  additional instructions   nitroGLYCERIN 0.4 MG SL tablet Commonly known as: Nitrostat Place 1 tablet (0.4 mg total) under the tongue every 5 (five) minutes as needed for chest pain.   pantoprazole 40 MG tablet Commonly known as: PROTONIX Take 1 tablet (40 mg total) by mouth daily.   rosuvastatin 20 MG tablet Commonly known as: CRESTOR Take 1 tablet (20 mg total) by mouth daily. Start taking on: August 29, 2019   tobramycin 0.3 % ophthalmic solution Commonly known as: TOBREX Place 1 drop into both eyes every 6 (six) hours.   Trelegy Ellipta 100-62.5-25 MCG/INH Aepb Generic drug: Fluticasone-Umeclidin-Vilant Inhale 1 Inhaler into the lungs daily. What changed:   when to take this  reasons to take this            Durable Medical Equipment  (From admission, onward)         Start     Ordered   08/28/19 1053  For home use only DME oxygen  Once       Question Answer Comment  Length of Need 6 Months   Mode or (Route) Nasal cannula   Liters per Minute 3   Frequency Continuous (stationary and portable oxygen unit needed)   Oxygen conserving device No   Oxygen delivery system Gas      08/28/19 1052          Outstanding Labs/Studies   BMET   Duration of Discharge Encounter  Greater than 30 minutes including physician time.  Signed, Kathyrn Drown, NP 08/28/2019, 11:07 AM

## 2019-08-28 NOTE — Progress Notes (Signed)
Progress Note  Patient Name: [name] Date of Encounter: 08/28/2019  [facility] Cardiologist: Sherren Mocha, MD    Patient Profile     84 y.o. female with a history of PAF (not on anticoagulation-patient refusal) admitted during the day on 08/26/2019 for acute lateral STEMI -> underwent PTCA only of 1st Diag 100% stenosis (reduced to 50%) -no PCI due to small caliber vessel and minimal downstream flow.  Subjective   Feeling much better today.  More alert and oriented.  Not complaining as much about nausea.  Still has phlegm but is better.  Is frustrated to have lower oxygen, but without oxygen her sats dropped to 89% on room air while seated.  With cardiac rehab today her O2 sats did not get above 87 despite being on 3 L of oxygen.--We will need home oxygen for discharge.  Otherwise no angina or heart failure symptoms.  No sense of palpitations.  Inpatient Medications    Scheduled Meds: . amLODipine  5 mg Oral Daily  . aspirin  81 mg Oral Daily  . Chlorhexidine Gluconate Cloth  6 each Topical Daily  . clopidogrel  75 mg Oral Q breakfast  . fluticasone furoate-vilanterol  1 puff Inhalation Daily  . metoprolol tartrate  25 mg Oral BID  . ondansetron (ZOFRAN) IV  4 mg Intravenous Once  . pantoprazole  40 mg Oral Daily  . rosuvastatin  20 mg Oral Daily  . sodium chloride flush  3 mL Intravenous Q12H  . umeclidinium bromide  1 puff Inhalation Daily   Continuous Infusions: . sodium chloride     PRN Meds: sodium chloride, acetaminophen, clonazePAM, guaiFENesin, magic mouthwash w/lidocaine, ondansetron (ZOFRAN) IV, sodium chloride flush   Vital Signs    Vitals:   08/28/19 0046 08/28/19 0200 08/28/19 0446 08/28/19 0757  BP: (!) 172/87  (!) 179/91 (!) 150/80  Pulse: 85  85 86  Resp: 16  15 17   Temp: 98.2 F (36.8 C)   (!) 97.5 F (36.4 C)  TempSrc: Oral   Oral  SpO2: 90% 92% 94% 95%  Weight:        Intake/Output Summary (Last 24 hours) at 08/28/2019 1007 Last  data filed at 08/27/2019 1500 Gross per 24 hour  Intake 5.59 ml  Output --  Net 5.59 ml   Last 3 Weights 08/27/2019 08/26/2019 07/23/2019  Weight (lbs) 124 lb 5.4 oz 132 lb 132 lb  Weight (kg) 56.4 kg 59.875 kg 59.875 kg      Telemetry    Sinus rhythm with PACs.,  Rate controlled --> personally Reviewed  ECG    No new EKG  Physical Exam   General appearance: alert, cooperative, no distress and Frail elderly woman.  Less tired appearing, but is definitely confused. Neck: no adenopathy, no carotid bruit, no JVD and thyroid not enlarged, symmetric, no tenderness/mass/nodules Lungs: normal percussion bilaterally and Mild basal crackles, no rales or rhonchi.  No wheezing Heart: regular rate and rhythm, S1, S2 normal and Distant heart sounds with no M/R/G.  Occasional ectopy. Abdomen: soft, non-tender; bowel sounds normal; no masses,  no organomegaly Extremities: extremities normal, atraumatic, no cyanosis or edema Pulses: 2+ and symmetric Neurologic: Mental status: alertness: alert, orientation: person, place, city, president, Not quite sure of the date and time, but easily redirected.  Just has a hard time remembering short-term issues such as what she just ate for breakfast, Somewhat slow unsteady gait, but did walk up in the hallway with cardiac rehab.   Labs  High Sensitivity Troponin:   Recent Labs  Lab 08/26/19 1240  TROPONINIHS 3,863*      Chemistry Recent Labs  Lab 08/26/19 1235 08/26/19 1240 08/27/19 0229  NA 141 142 143  K 3.6 3.6 4.2  CL 108 106 107  CO2  --  23 24  GLUCOSE 127* 123* 127*  BUN 21 20 19   CREATININE 2.00* 1.93* 1.82*  CALCIUM  --  9.0 8.6*  PROT  --  6.1*  --   ALBUMIN  --  3.1*  --   AST  --  32  --   ALT  --  12  --   ALKPHOS  --  49  --   BILITOT  --  0.6  --   GFRNONAA  --  24* 26*  GFRAA  --  28* 30*  ANIONGAP  --  13 12     Hematology Recent Labs  Lab 08/26/19 1235 08/26/19 1240 08/27/19 0229  WBC  --  9.9 8.7  RBC  --   4.37 4.14  HGB 15.0 13.3 12.6  HCT 44.0 41.8 40.3  MCV  --  95.7 97.3  MCH  --  30.4 30.4  MCHC  --  31.8 31.3  RDW  --  15.9* 16.0*  PLT  --  355 315    BNP Recent Labs  Lab 08/26/19 1240  BNP 1,086.4*     DDimer No results for input(s): DDIMER in the last 168 hours.   Radiology & Cardiac Studies    CARDIAC CATHETERIZATION  Result Date: 08/27/2019  1st Diag lesion is 100% stenosed.  Balloon angioplasty was performed using a BALLOON SAPPHIRE 2.0X12.  Post intervention, there is a 50% residual stenosis.  Mid LAD lesion is 30% stenosed.  1.  Total occlusion of the superior branch of the first diagonal with contrast staining, consistent with infarct vessel, treated with balloon angioplasty alone due to small vessel size and poor distal runoff 2.  Calcified coronary arteries with no significant obstructive disease in the left main, left circumflex, LAD proper, or RCA 3.  Minimally elevated LVEDP Plan: ICU care, add clopidogrel when nausea subsides, risk reduction measures as tolerated.  Unclear whether this is a de novo plaque rupture versus an embolic event.  Patient does have known paroxysmal atrial fibrillation and has refused anticoagulation.   DG Chest Portable 1 View  Result Date: 08/26/2019 CLINICAL DATA:  Chest pain. EXAM: PORTABLE CHEST 1 VIEW COMPARISON:  12/19/2018, chest CT 01/01/2017 FINDINGS: Emphysema with chronic hyperinflation. Cardiomegaly with slight progression from prior exam. Unchanged mediastinal contours with aortic atherosclerosis. Suspected small bilateral pleural effusions. No focal airspace disease. There is biapical pleuroparenchymal scarring. No pneumothorax. Bones are diffusely under mineralized. No acute osseous abnormalities are seen. IMPRESSION: 1. Emphysema with chronic hyperinflation. 2. Cardiomegaly with slight progression from prior exam. Suspected small bilateral pleural effusions. Aortic Atherosclerosis (ICD10-I70.0) and Emphysema (ICD10-J43.9).  Electronically Signed   By: Keith Rake M.D.   On: 08/26/2019 20:04   ECHOCARDIOGRAM COMPLETE  Result Date: 08/27/2019    ECHOCARDIOGRAM REPORT   Patient Name:   [name] Date of Exam: 08/27/2019 Medical Rec #:  [id number]     Height:       64.0 in Accession #:    [id number]    Weight:       124.3 lb Date of Birth:  [birth year]     BSA:          1.599 m Patient Age:    36 years  BP:           158/68 mmHg Patient Gender: F             HR:           67 bpm. Exam Location:  Inpatient Procedure: 2D Echo and 3D Echo Indications:    Acute Cornonary Syndrome I24.9  History:        Patient has prior history of Echocardiogram examinations, most                 recent 09/17/2017. CAD and Acute MI, COPD, Arrythmias:Atrial                 Fibrillation; Risk Factors:Former Smoker.  Sonographer:    Darlina Sicilian RDCS Referring Phys: [place]  1. Left ventricular ejection fraction, by estimation, is 60 to 65%. The left ventricle has normal function. Left ventricular endocardial border not optimally defined to evaluate regional wall motion. There is mild concentric left ventricular hypertrophy. Left ventricular diastolic function could not be evaluated.  2. Right ventricular systolic function is normal. The right ventricular size is normal.  3. Left atrial size was moderately dilated.  4. The mitral valve is normal in structure. No evidence of mitral valve regurgitation. No evidence of mitral stenosis.  5. The aortic valve is tricuspid. Aortic valve regurgitation is mild. FINDINGS  Left Ventricle: Left ventricular ejection fraction, by estimation, is 60 to 65%. The left ventricle has normal function. Left ventricular endocardial border not optimally defined to evaluate regional wall motion. The left ventricular internal cavity size was normal in size. There is mild concentric left ventricular hypertrophy. Left ventricular diastolic function could not be evaluated due to atrial fibrillation.  Left ventricular diastolic function could not be evaluated. Right Ventricle: The right ventricular size is normal. No increase in right ventricular wall thickness. Right ventricular systolic function is normal. Left Atrium: Left atrial size was moderately dilated. Right Atrium: Right atrial size was normal in size. Pericardium: There is no evidence of pericardial effusion. Mitral Valve: The mitral valve is normal in structure. No evidence of mitral valve regurgitation. No evidence of mitral valve stenosis. Tricuspid Valve: The tricuspid valve is grossly normal. Tricuspid valve regurgitation is not demonstrated. No evidence of tricuspid stenosis. Aortic Valve: The aortic valve is tricuspid. . There is mild thickening and mild calcification of the aortic valve. Aortic valve regurgitation is mild. There is mild thickening of the aortic valve. There is mild calcification of the aortic valve. Pulmonic Valve: The pulmonic valve was normal in structure. Pulmonic valve regurgitation is trivial. Aorta: The aortic root and ascending aorta are structurally normal, with no evidence of dilitation. IAS/Shunts: The atrial septum is grossly normal.  LEFT VENTRICLE PLAX 2D LVIDd:         3.90 cm     Diastology LVIDs:         2.80 cm     LV e' lateral:   4.68 cm/s LV PW:         1.30 cm     LV E/e' lateral: 14.2 LV IVS:        1.20 cm     LV e' medial:    4.43 cm/s LVOT diam:     2.00 cm     LV E/e' medial:  15.0 LV SV:         46 LV SV Index:   29 LVOT Area:     3.14 cm  LV Volumes (MOD) LV vol d,  MOD A2C: 85.4 ml LV vol d, MOD A4C: 79.7 ml LV vol s, MOD A2C: 39.9 ml LV vol s, MOD A4C: 39.8 ml LV SV MOD A2C:     45.5 ml LV SV MOD A4C:     79.7 ml LV SV MOD BP:      46.0 ml RIGHT VENTRICLE RV S prime:     8.63 cm/s TAPSE (M-mode): 2.0 cm LEFT ATRIUM             Index       RIGHT ATRIUM           Index LA diam:        3.60 cm 2.25 cm/m  RA Area:     15.10 cm LA Vol (A2C):   52.8 ml 33.03 ml/m RA Volume:   36.00 ml  22.52 ml/m LA  Vol (A4C):   86.9 ml 54.36 ml/m LA Biplane Vol: 69.0 ml 43.16 ml/m  AORTIC VALVE LVOT Vmax:   69.50 cm/s LVOT Vmean:  54.800 cm/s LVOT VTI:    0.148 m  AORTA Ao Root diam: 3.10 cm Ao Asc diam:  3.20 cm MITRAL VALVE MV Area (PHT): 4.68 cm    SHUNTS MV Decel Time: 162 msec    Systemic VTI:  0.15 m MV E velocity: 66.50 cm/s  Systemic Diam: 2.00 cm MV A velocity: 31.50 cm/s MV E/A ratio:  2.11 Mertie Moores MD Electronically signed by Mertie Moores MD Signature Date/Time: 08/27/2019/3:22:12 PM    Final      Assessment & Plan    Principal Problem:   Acute ST elevation myocardial infarction (STEMI) of lateral wall (HCC) Active Problems:   CAD S/P percutaneous coronary angioplasty   Paroxysmal atrial fibrillation (HCC)   Hyperlipidemia with target LDL less than 70   Essential hypertension   COPD with chronic bronchitis (HCC)  Principal Problem:   Acute ST elevation myocardial infarction (STEMI) of lateral wall (HCC)=- w/  CAD S/P percutaneous coronary angioplasty Relatively small caliber diagonal branch was occluded.  Successful PTCA.   Continue aspirin and Plavix along with atorvastatin and low-dose beta-blocker. Echocardiogram shows normal EF.   Despite elevated proBNP level, no signs or symptoms of heart failure.  Her hypoxia does not seem to be related to heart failure as there is no PND orthopnea or edema.  We will hold off on diuretic.    Paroxysmal atrial fibrillation (HCC)  Maintaining sinus rhythm.  Continue beta-blocker.  Per patient's adamant request, she does not want to be on Coumadin or blood thinners.  Is okay with Plavix.    Essential hypertension relatively well controlled on amlodipine and metoprolol.  With renal insufficiency, we are holding ARB    Hyperlipidemia with target LDL less than 70  Started low-dose carvedilol.     COPD with chronic bronchitis ([place]) -> inpatient substitution of home standing inhaler along with as needed nebulizers.  Added Mucinex for  phlegm, return to her home dose of inhaler for discharge.  Clearly has oxygen desaturation with ambulation even at rest.  We will order home health PT with nursing/home aide along with home oxygen.  Face-to-face ordered.   She is otherwise relatively stable cardiac standpoint.  No active issues.  We did simply need to get her set up with home oxygen which would probably be for at least a month post MI.  She does not seem to be all that interested in actually using it, but I tried her monitor to at least use it for the  first month.  One of the reasons why we are asking for home health is are concerned with the fact that she does live at home.  We talked her about trying to only stay with her sister who is also elderly but more stable for the next month or so.  We will set up follow-up with Dr. Stanford Breed, she says she has an appointment-is on July 28.  I think is probably better for her to also be seen within 2 weeks by APP simply to ensure that she is taking her medications.  .   For questions or updates, please contact [place] Please consult [email or web address] for contact info under        Signed, Glenetta Hew, MD  08/28/2019, 10:07 AM

## 2019-08-29 ENCOUNTER — Telehealth: Payer: Self-pay | Admitting: *Deleted

## 2019-08-29 ENCOUNTER — Telehealth: Payer: Self-pay

## 2019-08-29 DIAGNOSIS — I129 Hypertensive chronic kidney disease with stage 1 through stage 4 chronic kidney disease, or unspecified chronic kidney disease: Secondary | ICD-10-CM | POA: Diagnosis not present

## 2019-08-29 DIAGNOSIS — I480 Paroxysmal atrial fibrillation: Secondary | ICD-10-CM | POA: Diagnosis not present

## 2019-08-29 DIAGNOSIS — J439 Emphysema, unspecified: Secondary | ICD-10-CM | POA: Diagnosis not present

## 2019-08-29 DIAGNOSIS — I2510 Atherosclerotic heart disease of native coronary artery without angina pectoris: Secondary | ICD-10-CM | POA: Diagnosis not present

## 2019-08-29 DIAGNOSIS — I2129 ST elevation (STEMI) myocardial infarction involving other sites: Secondary | ICD-10-CM | POA: Diagnosis not present

## 2019-08-29 DIAGNOSIS — I351 Nonrheumatic aortic (valve) insufficiency: Secondary | ICD-10-CM | POA: Diagnosis not present

## 2019-08-29 DIAGNOSIS — E559 Vitamin D deficiency, unspecified: Secondary | ICD-10-CM | POA: Diagnosis not present

## 2019-08-29 DIAGNOSIS — N184 Chronic kidney disease, stage 4 (severe): Secondary | ICD-10-CM | POA: Diagnosis not present

## 2019-08-29 DIAGNOSIS — I700 Atherosclerosis of aorta: Secondary | ICD-10-CM | POA: Diagnosis not present

## 2019-08-29 DIAGNOSIS — E785 Hyperlipidemia, unspecified: Secondary | ICD-10-CM | POA: Diagnosis not present

## 2019-08-29 DIAGNOSIS — E538 Deficiency of other specified B group vitamins: Secondary | ICD-10-CM | POA: Diagnosis not present

## 2019-08-29 NOTE — Telephone Encounter (Signed)
Pt was on TCM report admitted 08/26/2019 for acute lateral STEMIand underwent PTCA only of1st Diag 100% stenosis (reduced to 50%) (no PCI due to small caliber vessel and minimal downstream flow). Pt D/C 08/28/19 and will follow-up w/cardiologyh Deberah Pelton, NP (Cardiology) on 09/10/2019; 10:15am../lmb

## 2019-08-29 NOTE — Telephone Encounter (Signed)
Patient called and spoke to team health at 08/29/2019 9:43:49 AM and states pt has heart attack 5 days ago and having sob.  Caller states she is calling to cancel her appt. Had MI 5 days ago. She is on oxygen. Oxygen is in a large tank. Does not have a portable tank. She is SOB. no chest pain. States her oxygen is not working.   Advised to go to ED now.   Called back line and spoke to [place] and gave report that pt had MI 5 days ago. She is SOB. States her oxygen is not working and does not know who brought it out. Triage outcome of go to ER now but pt states she is not going to the ER. States the assistant will call pt back.  Rhae Lerner spoke with patient and asked what sticker was on her O2 and she was told [place]. Rhae Lerner looked up number to local office and gave to patient. Patient states that she does not have long distance for the 800 number given and that she was going to have her sister call for her.

## 2019-08-30 ENCOUNTER — Telehealth: Payer: Self-pay

## 2019-08-30 DIAGNOSIS — J449 Chronic obstructive pulmonary disease, unspecified: Secondary | ICD-10-CM | POA: Diagnosis not present

## 2019-08-30 DIAGNOSIS — J438 Other emphysema: Secondary | ICD-10-CM | POA: Diagnosis not present

## 2019-08-30 NOTE — Telephone Encounter (Signed)
Called today to state that the oxygen has stopped working again. Reached out to on call for [facility] to see if they can get someone over there to look at it. This is two days in a row, perhaps they need to change the tank, work with [facility] on this.

## 2019-08-30 NOTE — Telephone Encounter (Signed)
Called [facility], and it appeared there was never a delivery of oxygen to the patient. She was on the tank that was originally sent with her home. Yesterday, 911 was the one who fixed the oxygen. [facility] figured out the issues and are now getting ready to deliver the rest of the oxygen to the patient.

## 2019-09-02 ENCOUNTER — Ambulatory Visit (INDEPENDENT_AMBULATORY_CARE_PROVIDER_SITE_OTHER): Payer: Medicare Other

## 2019-09-02 ENCOUNTER — Telehealth: Payer: Self-pay | Admitting: Cardiology

## 2019-09-02 DIAGNOSIS — Z0000 Encounter for general adult medical examination without abnormal findings: Secondary | ICD-10-CM

## 2019-09-02 NOTE — Telephone Encounter (Signed)
Patient called and canceled  her hospital follow/up scheduled for 09/10/19. The patient only wants to see Dr. Stanford Breed and not a NP.

## 2019-09-02 NOTE — Patient Instructions (Signed)
[name] , Thank you for taking time to come for your Medicare Wellness Visit. I appreciate your ongoing commitment to your health goals. Please review the following plan we discussed and let me know if I can assist you in the future.   Screening recommendations/referrals: Colonoscopy: never done Mammogram: never done Bone Density: 07/28/2014 Recommended yearly ophthalmology/optometry visit for glaucoma screening and checkup Recommended yearly dental visit for hygiene and checkup  Vaccinations: Influenza vaccine: 10/28/2018 Pneumococcal vaccine: completed Tdap vaccine: 03/08/2013 Shingles vaccine: never done   Covid-19: never done  Advanced directives: Advance directive discussed with you today. Even though you declined this today please call our office should you change your mind and we can give you the proper paperwork for you to fill out.   Conditions/risks identified: Please continue to do your personal lifestyle choices by: daily care of teeth and gums, regular physical activity (goal should be 5 days a week for 30 minutes), eat a healthy diet, avoid tobacco and drug use, limiting any alcohol intake, taking a low-dose aspirin (if not allergic or have been advised by your provider otherwise) and taking vitamins and minerals as recommended by your provider. Continue doing brain stimulating activities (puzzles, reading, adult coloring books, staying active) to keep memory sharp. Continue to eat heart healthy diet (full of fruits, vegetables, whole grains, lean protein, water--limit salt, fat, and sugar intake) and increase physical activity as tolerated.  Next appointment: Please schedule your next Medicare Wellness Visit in 1 year.   Preventive Care 106 Years and Older, Female Preventive care refers to lifestyle choices and visits with your health care provider that can promote health and wellness. What does preventive care include?  A yearly physical exam. This is also called an annual  well check.  Dental exams once or twice a year.  Routine eye exams. Ask your health care provider how often you should have your eyes checked.  Personal lifestyle choices, including:  Daily care of your teeth and gums.  Regular physical activity.  Eating a healthy diet.  Avoiding tobacco and drug use.  Limiting alcohol use.  Practicing safe sex.  Taking low-dose aspirin every day.  Taking vitamin and mineral supplements as recommended by your health care provider. What happens during an annual well check? The services and screenings done by your health care provider during your annual well check will depend on your age, overall health, lifestyle risk factors, and family history of disease. Counseling  Your health care provider may ask you questions about your:  Alcohol use.  Tobacco use.  Drug use.  Emotional well-being.  Home and relationship well-being.  Sexual activity.  Eating habits.  History of falls.  Memory and ability to understand (cognition).  Work and work Statistician.  Reproductive health. Screening  You may have the following tests or measurements:  Height, weight, and BMI.  Blood pressure.  Lipid and cholesterol levels. These may be checked every 5 years, or more frequently if you are over 67 years old.  Skin check.  Lung cancer screening. You may have this screening every year starting at age 16 if you have a 30-pack-year history of smoking and currently smoke or have quit within the past 15 years.  Fecal occult blood test (FOBT) of the stool. You may have this test every year starting at age 64.  Flexible sigmoidoscopy or colonoscopy. You may have a sigmoidoscopy every 5 years or a colonoscopy every 10 years starting at age 67.  Hepatitis C blood test.  Hepatitis  B blood test.  Sexually transmitted disease (STD) testing.  Diabetes screening. This is done by checking your blood sugar (glucose) after you have not eaten for a while  (fasting). You may have this done every 1-3 years.  Bone density scan. This is done to screen for osteoporosis. You may have this done starting at age 13.  Mammogram. This may be done every 1-2 years. Talk to your health care provider about how often you should have regular mammograms. Talk with your health care provider about your test results, treatment options, and if necessary, the need for more tests. Vaccines  Your health care provider may recommend certain vaccines, such as:  Influenza vaccine. This is recommended every year.  Tetanus, diphtheria, and acellular pertussis (Tdap, Td) vaccine. You may need a Td booster every 10 years.  Zoster vaccine. You may need this after age 73.  Pneumococcal 13-valent conjugate (PCV13) vaccine. One dose is recommended after age 75.  Pneumococcal polysaccharide (PPSV23) vaccine. One dose is recommended after age 5. Talk to your health care provider about which screenings and vaccines you need and how often you need them. This information is not intended to replace advice given to you by your health care provider. Make sure you discuss any questions you have with your health care provider. Document Released: 03/19/2015 Document Revised: 11/10/2015 Document Reviewed: 12/22/2014 Elsevier Interactive Patient Education  2017 [place] Prevention in the Home Falls can cause injuries. They can happen to people of all ages. There are many things you can do to make your home safe and to help prevent falls. What can I do on the outside of my home?  Regularly fix the edges of walkways and driveways and fix any cracks.  Remove anything that might make you trip as you walk through a door, such as a raised step or threshold.  Trim any bushes or trees on the path to your home.  Use bright outdoor lighting.  Clear any walking paths of anything that might make someone trip, such as rocks or tools.  Regularly check to see if handrails are loose  or broken. Make sure that both sides of any steps have handrails.  Any raised decks and porches should have guardrails on the edges.  Have any leaves, snow, or ice cleared regularly.  Use sand or salt on walking paths during winter.  Clean up any spills in your garage right away. This includes oil or grease spills. What can I do in the bathroom?  Use night lights.  Install grab bars by the toilet and in the tub and shower. Do not use towel bars as grab bars.  Use non-skid mats or decals in the tub or shower.  If you need to sit down in the shower, use a plastic, non-slip stool.  Keep the floor dry. Clean up any water that spills on the floor as soon as it happens.  Remove soap buildup in the tub or shower regularly.  Attach bath mats securely with double-sided non-slip rug tape.  Do not have throw rugs and other things on the floor that can make you trip. What can I do in the bedroom?  Use night lights.  Make sure that you have a light by your bed that is easy to reach.  Do not use any sheets or blankets that are too big for your bed. They should not hang down onto the floor.  Have a firm chair that has side arms. You can use this for  support while you get dressed.  Do not have throw rugs and other things on the floor that can make you trip. What can I do in the kitchen?  Clean up any spills right away.  Avoid walking on wet floors.  Keep items that you use a lot in easy-to-reach places.  If you need to reach something above you, use a strong step stool that has a grab bar.  Keep electrical cords out of the way.  Do not use floor polish or wax that makes floors slippery. If you must use wax, use non-skid floor wax.  Do not have throw rugs and other things on the floor that can make you trip. What can I do with my stairs?  Do not leave any items on the stairs.  Make sure that there are handrails on both sides of the stairs and use them. Fix handrails that are  broken or loose. Make sure that handrails are as long as the stairways.  Check any carpeting to make sure that it is firmly attached to the stairs. Fix any carpet that is loose or worn.  Avoid having throw rugs at the top or bottom of the stairs. If you do have throw rugs, attach them to the floor with carpet tape.  Make sure that you have a light switch at the top of the stairs and the bottom of the stairs. If you do not have them, ask someone to add them for you. What else can I do to help prevent falls?  Wear shoes that:  Do not have high heels.  Have rubber bottoms.  Are comfortable and fit you well.  Are closed at the toe. Do not wear sandals.  If you use a stepladder:  Make sure that it is fully opened. Do not climb a closed stepladder.  Make sure that both sides of the stepladder are locked into place.  Ask someone to hold it for you, if possible.  Clearly mark and make sure that you can see:  Any grab bars or handrails.  First and last steps.  Where the edge of each step is.  Use tools that help you move around (mobility aids) if they are needed. These include:  Canes.  Walkers.  Scooters.  Crutches.  Turn on the lights when you go into a dark area. Replace any light bulbs as soon as they burn out.  Set up your furniture so you have a clear path. Avoid moving your furniture around.  If any of your floors are uneven, fix them.  If there are any pets around you, be aware of where they are.  Review your medicines with your doctor. Some medicines can make you feel dizzy. This can increase your chance of falling. Ask your doctor what other things that you can do to help prevent falls. This information is not intended to replace advice given to you by your health care provider. Make sure you discuss any questions you have with your health care provider. Document Released: 12/17/2008 Document Revised: 07/29/2015 Document Reviewed: 03/27/2014 Elsevier  Interactive Patient Education  2017 [employer].

## 2019-09-02 NOTE — Progress Notes (Signed)
I connected with [name] today by telephone and verified that I am speaking with the correct person using two identifiers. Location patient: home Location provider: work Persons participating in the virtual visit: Ahilyn Nell and [employer]. Dorri Ozturk, LPN   I discussed the limitations, risks, security and privacy concerns of performing an evaluation and management service by telephone and the availability of in person appointments. I also discussed with the patient that there may be a patient responsible charge related to this service. The patient expressed understanding and verbally consented to this telephonic visit.    Interactive audio and video telecommunications were attempted between this provider and patient, however failed, due to patient having technical difficulties OR patient did not have access to video capability.  We continued and completed visit with audio only.  Some vital signs may be absent or patient reported.   Time Spent with patient on telephone encounter: 35 minutes  Subjective:   [name] is a 84 y.o. female who presents for Medicare Annual (Subsequent) preventive examination.  Review of Systems    No ROS. Medicare Wellness Visit. Cardiac Risk Factors include: advanced age (>68men, >94 women);dyslipidemia;family history of premature cardiovascular disease;hypertension     Objective:    Today's Vitals   09/02/19 1503  PainSc: 6   PainLoc: Foot   There is no height or weight on file to calculate BMI.  Advanced Directives 08/26/2019 04/29/2018 09/11/2016 09/07/2016 08/11/2016 08/11/2016  Does Patient Have a Medical Advance Directive? No No No No No No  Would patient like information on creating a medical advance directive? No - Patient declined Yes (ED - Information included in AVS) No - Patient declined No - Patient declined No - Patient declined No - Patient declined    Current Medications (verified) Outpatient Encounter Medications as of 09/02/2019    Medication Sig  . amLODipine (NORVASC) 5 MG tablet TAKE 1 TABLET(5 MG) BY MOUTH DAILY  . aspirin 81 MG chewable tablet Chew 1 tablet (81 mg total) by mouth daily.  . Biotin 1000 MCG tablet Take 1,000 mcg by mouth daily.  . Cholecalciferol ([employer] VITAMIN D3) 1000 UNITS capsule Take 1,000 Units by mouth daily.    . clonazePAM (KLONOPIN) 0.5 MG tablet TAKE 1 TABLET BY MOUTH AT BEDTIME AS NEEDED FOR LEG CRAMPS (Patient taking differently: Take 0.5 mg by mouth daily as needed (For leg cramps per patient). )  . clopidogrel (PLAVIX) 75 MG tablet Take 1 tablet (75 mg total) by mouth daily with breakfast.  . cyanocobalamin 1000 MCG tablet Take 1,000 mcg by mouth daily.  . Fluticasone-Umeclidin-Vilant (TRELEGY ELLIPTA) 100-62.5-25 MCG/INH AEPB Inhale 1 Inhaler into the lungs daily. (Patient taking differently: Inhale 1 Inhaler into the lungs daily as needed (For shortness of breath). )  . ketoconazole (NIZORAL) 2 % cream APPLY TOPICALLY TO THE AFFECTED AREA DAILY (Patient taking differently: Apply 1 application topically daily as needed for irritation. )  . magic mouthwash w/lidocaine SOLN Take 5 mLs by mouth 4 (four) times daily as needed for mouth pain. (Patient not taking: Reported on 07/23/2019)  . metoprolol tartrate (LOPRESSOR) 25 MG tablet TAKE 1/2 TABLET(12.5 MG) BY MOUTH TWICE DAILY (Patient taking differently: Take 12.5 mg by mouth 2 (two) times daily. )  . nitroGLYCERIN (NITROSTAT) 0.4 MG SL tablet Place 1 tablet (0.4 mg total) under the tongue every 5 (five) minutes as needed for chest pain.  . pantoprazole (PROTONIX) 40 MG tablet Take 1 tablet (40 mg total) by mouth daily.  Marland Kitchen  rosuvastatin (CRESTOR) 20 MG tablet Take 1 tablet (20 mg total) by mouth daily.  Marland Kitchen tobramycin (TOBREX) 0.3 % ophthalmic solution Place 1 drop into both eyes every 6 (six) hours.    No facility-administered encounter medications on file as of 09/02/2019.    Allergies (verified) Amoxicillin, Codeine, Levaquin [levofloxacin  in d5w], and Other   History: Past Medical History:  Diagnosis Date  . Anxiety   . Arthritis   . Atrial fibrillation ([place])   . B12 deficiency   . COPD (chronic obstructive pulmonary disease) ([place])   . Depression   . GERD (gastroesophageal reflux disease)   . Hypertension   . Osteoarthrosis, hand    both hands  . Peptic ulcer, unspecified site, unspecified as acute or chronic, without mention of hemorrhage, perforation, or obstruction   . Pneumonia   . PONV (postoperative nausea and vomiting)   . Vitamin D deficiency disease    Past Surgical History:  Procedure Laterality Date  . BLADDER SURGERY     Bladder tack and intestines  . CORONARY/GRAFT ACUTE MI REVASCULARIZATION N/A 08/26/2019   Procedure: Coronary/Graft Acute MI Revascularization;  Surgeon: Sherren Mocha, MD;  Location: [facility];  Service: Cardiovascular;  Laterality: N/A;  . CYSTOCELE REPAIR    . CYSTOSCOPY WITH RETROGRADE PYELOGRAM, URETEROSCOPY AND STENT PLACEMENT Right 09/11/2016   Procedure: CYSTOSCOPY WITH RETROGRADE PYELOGRAM, URETEROSCOPY AND STENT REPLACEMENT;  Surgeon: Cleon Gustin, MD;  Location: [facility];  Service: Urology;  Laterality: Right;  . CYSTOSCOPY WITH STENT PLACEMENT Right 08/11/2016   Procedure: CYSTOSCOPY, URETERSCOPY,  RIGHT RETROGRADE WITH RIGHT STENT PLACEMENT;  Surgeon: Festus Aloe, MD;  Location: [facility];  Service: Urology;  Laterality: Right;  . SPLENECTOMY     Family History  Problem Relation Age of Onset  . Heart disease Father   . Arthritis Other        Family history of  . Coronary artery disease Other        Family history of  . Hypertension Other        Family history of  . Diabetes Daughter        65 died   Social History   Socioeconomic History  . Marital status: Divorced    Spouse name: Not on file  . Number of children: 2  . Years of education: Not on file  . Highest education level: Not on file  Occupational History  . Occupation: Research scientist (physical sciences): [employer]  Tobacco Use  . Smoking status: Former Smoker    Packs/day: 1.00    Types: Cigarettes    Quit date: 2008/05/26    Years since quitting: 11.4  . Smokeless tobacco: Never Used  . Tobacco comment: pt unsure how many years she smoked  Vaping Use  . Vaping Use: Never used  Substance and Sexual Activity  . Alcohol use: No  . Drug use: No  . Sexual activity: Not Currently  Other Topics Concern  . Not on file  Social History Narrative   Retired, works part time   Divorced   Former Smoker   1- Son alcoholic abusive   Daughter died in 2006/05/27   Social Determinants of Health   Financial Resource Strain: High Risk  . Difficulty of Paying Living Expenses: Very hard  Food Insecurity: Food Insecurity Present  . Worried About Charity fundraiser in the Last Year: Often true  . Ran Out of Food in the Last Year: Often true  Transportation Needs: Unmet Transportation  Needs  . Lack of Transportation (Medical): Yes  . Lack of Transportation (Non-Medical): Yes  Physical Activity: Unknown  . Days of Exercise per Week: Patient refused  . Minutes of Exercise per Session: Not on file  Stress: No Stress Concern Present  . Feeling of Stress : Not at all  Social Connections:   . Frequency of Communication with Friends and Family:   . Frequency of Social Gatherings with Friends and Family:   . Attends Religious Services:   . Active Member of Clubs or Organizations:   . Attends Archivist Meetings:   Marland Kitchen Marital Status:     Tobacco Counseling Counseling given: No Comment: pt unsure how many years she smoked   Clinical Intake:  Pre-visit preparation completed: Yes  Pain : 0-10 Pain Score: 6  Pain Type: Acute pain Pain Location: Foot Pain Orientation: Right, Left Pain Descriptors / Indicators: Tingling, Other (Comment) (cold sensation and turning black in color) Pain Onset: In the past 7 days Pain Frequency: Several days a week     Nutritional Risks:  None Diabetes: No  How often do you need to have someone help you when you read instructions, pamphlets, or other written materials from your doctor or pharmacy?: 1 - Never 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 no  Interpreter Needed?: No (hard of hearing)  Information entered by :: [employer]. Yasmin Dibello, LPN   Activities of Daily Living In your present state of health, do you have any difficulty performing the following activities: 09/02/2019 08/26/2019  Hearing? Tempie Donning  Vision? N Y  Difficulty concentrating or making decisions? Tempie Donning  Walking or climbing stairs? Y Y  Dressing or bathing? N N  Doing errands, shopping? Tempie Donning  Preparing Food and eating ? Y -  Using the Toilet? N -  In the past six months, have you accidently leaked urine? Y -  Do you have problems with loss of bowel control? N -  Managing your Medications? N -  Managing your Finances? N -  Housekeeping or managing your Housekeeping? Y -  Some recent data might be hidden    Patient Care Team: Plotnikov, Evie Lacks, MD as PCP - Cyndia Diver, MD as PCP - Cardiology (Cardiology)  Indicate any recent Medical Services you may have received from other than [facility] providers in the past year (date may be approximate).     Assessment:   This is a routine wellness examination for [place].  Hearing/Vision screen No exam data present  Dietary issues and exercise activities discussed: Current Exercise Habits: The patient does not participate in regular exercise at present, Exercise limited by: Other - see comments (recovering from TIA)  Goals   None    Depression Screen PHQ 2/9 Scores 09/02/2019 01/01/2018 01/01/2018 08/10/2016 09/17/2015 08/28/2014 05/26/2014  PHQ - 2 Score 2 0 0 0 0 0 0    Fall Risk Fall Risk  09/02/2019 01/01/2018 08/10/2016 09/17/2015 08/28/2014  Falls in the past year? 0 No No No Yes  Number falls in past yr: 0 - - - 1  Injury with Fall? 0 - - - Yes  Risk  for fall due to : Impaired balance/gait - - - -  Follow up Falls evaluation completed - - - -    Any stairs in or around the home? No  If so, are there any without handrails? No  Home free of loose throw rugs in walkways, pet beds, electrical cords,  etc? Yes  Adequate lighting in your home to reduce risk of falls? Yes   ASSISTIVE DEVICES UTILIZED TO PREVENT FALLS:  Life alert? No  Use of a cane, walker or w/c? Yes  Grab bars in the bathroom? No  Shower chair or bench in shower? No  Elevated toilet seat or a handicapped toilet? yes  TIMED UP AND GO:  Was the test performed? No .  Length of time to ambulate 10 feet: 0 sec.   Gait unsteady with use of assistive device, provider informed and education provided.  (per patient)  Cognitive Function: MMSE - Mini Mental State Exam 09/02/2019  Not completed: Refused        Immunizations Immunization History  Administered Date(s) Administered  . Fluad Quad(high Dose 65+) 10/28/2018  . Influenza Split 12/05/2011  . Influenza Whole 12/21/2009, 01/24/2011  . Influenza, High Dose Seasonal PF 10/31/2016, 01/01/2018  . Influenza,inj,Quad PF,6+ Mos 11/06/2012, 12/28/2014  . Influenza-Unspecified 12/04/2013, 12/19/2015  . Pneumococcal Conjugate-13 12/28/2014  . Pneumococcal Polysaccharide-23 03/08/2009, 04/09/2018  . Tdap 03/08/2013    TDAP status: Up to date Flu Vaccine status: Up to date Pneumococcal vaccine status: Up to date Covid-19 vaccine status: Declined, Education has been provided regarding the importance of this vaccine but patient still declined. Advised may receive this vaccine at local pharmacy or Health Dept.or vaccine clinic. Aware to provide a copy of the vaccination record if obtained from local pharmacy or Health Dept. Verbalized acceptance and understanding.  Qualifies for Shingles Vaccine? Yes   Zostavax completed No   Shingrix Completed?: No.    Education has been provided regarding the importance of this  vaccine. Patient has been advised to call insurance company to determine out of pocket expense if they have not yet received this vaccine. Advised may also receive vaccine at local pharmacy or Health Dept. Verbalized acceptance and understanding.  Screening Tests Health Maintenance  Topic Date Due  . COVID-19 Vaccine (1) Never done  . INFLUENZA VACCINE  10/05/2019  . TETANUS/TDAP  03/09/2023  . DEXA SCAN  Completed  . PNA vac Low Risk Adult  Completed    Health Maintenance  Health Maintenance Due  Topic Date Due  . COVID-19 Vaccine (1) Never done    Colorectal cancer screening: No longer required.  Mammogram status: No longer required.  Bone Density status: Completed 07/28/2014. Results reflect: Bone density results: NORMAL. Repeat every 10 years.  Lung Cancer Screening: (Low Dose CT Chest recommended if Age 64-80 years, 30 pack-year currently smoking OR have quit w/in 15years.) does qualify.   Lung Cancer Screening Referral: no  Additional Screening:  Hepatitis C Screening: does not qualify; Completed no  Vision Screening: Recommended annual ophthalmology exams for early detection of glaucoma and other disorders of the eye. Is the patient up to date with their annual eye exam?  Yes  Who is the provider or what is the name of the office in which the patient attends annual eye exams? Sherlynn Stalls, MD If pt is not established with a provider, would they like to be referred to a provider to establish care? No .   Dental Screening: Recommended annual dental exams for proper oral hygiene  Community Resource Referral / Chronic Care Management: CRR required this visit?  Yes   CCM required this visit?  Yes      Plan:     I have personally reviewed and noted the following in the patient's chart:   . Medical and social history . Use of alcohol, tobacco  or illicit drugs  . Current medications and supplements . Functional ability and status . Nutritional status . Physical  activity . Advanced directives . List of other physicians . Hospitalizations, surgeries, and ER visits in previous 12 months . Vitals . Screenings to include cognitive, depression, and falls . Referrals and appointments  In addition, I have reviewed and discussed with patient certain preventive protocols, quality metrics, and best practice recommendations. A written personalized care plan for preventive services as well as general preventive health recommendations were provided to patient.     Sheral Flow, LPN   06/21/4079   Nurse Notes:  No vital signs were taken during this visit (televisit) Gait unsteady with use of assistive device, provider informed and education provided.  (per patient) Patient is in need of several referrals to [facility] due to: transportation issues, depression due to death of son, financial strain, food insecurity and no family support. Patient is also in need of a portable oxygen tank due to shortness of breath.  She does have a oxygen tank to use for home only. Patient stated that she is all alone with no help; she was recently discharged from hospital for having cardiac issues.

## 2019-09-03 ENCOUNTER — Telehealth: Payer: Self-pay | Admitting: Internal Medicine

## 2019-09-03 ENCOUNTER — Telehealth: Payer: Self-pay | Admitting: Cardiology

## 2019-09-03 DIAGNOSIS — I480 Paroxysmal atrial fibrillation: Secondary | ICD-10-CM | POA: Diagnosis not present

## 2019-09-03 DIAGNOSIS — I2129 ST elevation (STEMI) myocardial infarction involving other sites: Secondary | ICD-10-CM | POA: Diagnosis not present

## 2019-09-03 DIAGNOSIS — I700 Atherosclerosis of aorta: Secondary | ICD-10-CM | POA: Diagnosis not present

## 2019-09-03 DIAGNOSIS — I2510 Atherosclerotic heart disease of native coronary artery without angina pectoris: Secondary | ICD-10-CM | POA: Diagnosis not present

## 2019-09-03 DIAGNOSIS — E785 Hyperlipidemia, unspecified: Secondary | ICD-10-CM | POA: Diagnosis not present

## 2019-09-03 DIAGNOSIS — E538 Deficiency of other specified B group vitamins: Secondary | ICD-10-CM | POA: Diagnosis not present

## 2019-09-03 DIAGNOSIS — I129 Hypertensive chronic kidney disease with stage 1 through stage 4 chronic kidney disease, or unspecified chronic kidney disease: Secondary | ICD-10-CM | POA: Diagnosis not present

## 2019-09-03 DIAGNOSIS — I351 Nonrheumatic aortic (valve) insufficiency: Secondary | ICD-10-CM | POA: Diagnosis not present

## 2019-09-03 DIAGNOSIS — J439 Emphysema, unspecified: Secondary | ICD-10-CM | POA: Diagnosis not present

## 2019-09-03 DIAGNOSIS — E559 Vitamin D deficiency, unspecified: Secondary | ICD-10-CM | POA: Diagnosis not present

## 2019-09-03 DIAGNOSIS — N184 Chronic kidney disease, stage 4 (severe): Secondary | ICD-10-CM | POA: Diagnosis not present

## 2019-09-03 NOTE — Telephone Encounter (Signed)
Verbal order given  

## 2019-09-03 NOTE — Telephone Encounter (Signed)
Patient c/o Palpitations:  High priority if patient c/o lightheadedness, shortness of breath, or chest pain  1) How long have you had palpitations/irregular HR/ Afib? Are you having the symptoms now? For about a week. Yes   2) Are you currently experiencing lightheadedness, SOB or CP? No   3) Do you have a history of afib (atrial fibrillation) or irregular heart rhythm? yes  4) Have you checked your BP or HR? (document readings if available): no   5) Are you experiencing any other symptoms? no

## 2019-09-03 NOTE — Telephone Encounter (Signed)
Spoke to pt who report she was recently started on plavix and ASA due to recent cath intervention. Pt state since starting medication, her feet and toes are black and right arm is bruised. Pt also report they stopped her flecainide feels she keep flipping back in and out of Afib. Pt also state her SOB has worsened since discharge and noticed some swelling in her feet.   Nurse offered an appointment but pt state she can't make it until Friday. Will route to MD for further recommendations.

## 2019-09-03 NOTE — Telephone Encounter (Signed)
New message   Per Sharyn Lull need verbal order for medical social worker evaluation to assist the patient with transportation. Please call to discuss.

## 2019-09-03 NOTE — Progress Notes (Signed)
°  Chronic Care Management   Outreach Note  09/03/2019 Name: [name] MRN: [id number] DOB: 02/09/1936  Referred by: Cassandria Anger, MD Reason for referral : No chief complaint on file.   Chronic Care Management   Note  09/03/2019 Name: [name] MRN: [id number] DOB: Feb 25, 1936  [name] is a 84 y.o. year old female who is a primary care patient of Plotnikov, Evie Lacks, MD. I reached out to [name] by phone today in response to a referral sent by Ms. [name] Obar's PCP, Plotnikov, Evie Lacks, MD.   Ms. Carbone was given information about Chronic Care Management services today including:  1. CCM service includes personalized support from designated clinical staff supervised by her physician, including individualized plan of care and coordination with other care providers 2. 24/7 contact phone numbers for assistance for urgent and routine care needs. 3. Service will only be billed when office clinical staff spend 20 minutes or more in a month to coordinate care. 4. Only one practitioner may furnish and bill the service in a calendar month. 5. The patient may stop CCM services at any time (effective at the end of the month) by phone call to the office staff.   Patient did not agree to enrollment in care management services and does not wish to consider at this time.  Follow up plan:   Earney [place] [employer] Scheduler  Follow Up Plan:

## 2019-09-04 NOTE — Telephone Encounter (Signed)
Needs APP ov [facility]

## 2019-09-05 DIAGNOSIS — E538 Deficiency of other specified B group vitamins: Secondary | ICD-10-CM | POA: Diagnosis not present

## 2019-09-05 DIAGNOSIS — I2510 Atherosclerotic heart disease of native coronary artery without angina pectoris: Secondary | ICD-10-CM | POA: Diagnosis not present

## 2019-09-05 DIAGNOSIS — E785 Hyperlipidemia, unspecified: Secondary | ICD-10-CM | POA: Diagnosis not present

## 2019-09-05 DIAGNOSIS — J439 Emphysema, unspecified: Secondary | ICD-10-CM | POA: Diagnosis not present

## 2019-09-05 DIAGNOSIS — N184 Chronic kidney disease, stage 4 (severe): Secondary | ICD-10-CM | POA: Diagnosis not present

## 2019-09-05 DIAGNOSIS — I351 Nonrheumatic aortic (valve) insufficiency: Secondary | ICD-10-CM | POA: Diagnosis not present

## 2019-09-05 DIAGNOSIS — I480 Paroxysmal atrial fibrillation: Secondary | ICD-10-CM | POA: Diagnosis not present

## 2019-09-05 DIAGNOSIS — I2129 ST elevation (STEMI) myocardial infarction involving other sites: Secondary | ICD-10-CM | POA: Diagnosis not present

## 2019-09-05 DIAGNOSIS — E559 Vitamin D deficiency, unspecified: Secondary | ICD-10-CM | POA: Diagnosis not present

## 2019-09-05 DIAGNOSIS — I700 Atherosclerosis of aorta: Secondary | ICD-10-CM | POA: Diagnosis not present

## 2019-09-05 DIAGNOSIS — I129 Hypertensive chronic kidney disease with stage 1 through stage 4 chronic kidney disease, or unspecified chronic kidney disease: Secondary | ICD-10-CM | POA: Diagnosis not present

## 2019-09-05 NOTE — Telephone Encounter (Signed)
Left message for patient, no change at this time. Make sure to keep follow up appointment.

## 2019-09-09 ENCOUNTER — Telehealth: Payer: Self-pay | Admitting: Cardiology

## 2019-09-09 NOTE — Progress Notes (Signed)
Cardiology Clinic Note   Patient Name: [name] Date of Encounter: 09/10/2019  Primary Care Provider:  Cassandria Anger, MD Primary Cardiologist:  Sherren Mocha, MD  Patient Profile    [name] 84 year old female presents the clinic today for follow-up of her essential hypertension, paroxysmal atrial fibrillation, and coronary artery disease.  Past Medical History    Past Medical History:  Diagnosis Date  . Anxiety   . Arthritis   . Atrial fibrillation ([place])   . B12 deficiency   . COPD (chronic obstructive pulmonary disease) ([place])   . Depression   . GERD (gastroesophageal reflux disease)   . Hypertension   . Osteoarthrosis, hand    both hands  . Peptic ulcer, unspecified site, unspecified as acute or chronic, without mention of hemorrhage, perforation, or obstruction   . Pneumonia   . PONV (postoperative nausea and vomiting)   . Vitamin D deficiency disease    Past Surgical History:  Procedure Laterality Date  . BLADDER SURGERY     Bladder tack and intestines  . CORONARY/GRAFT ACUTE MI REVASCULARIZATION N/A 08/26/2019   Procedure: Coronary/Graft Acute MI Revascularization;  Surgeon: Sherren Mocha, MD;  [facility];  Service: Cardiovascular;  Laterality: N/A;  . CYSTOCELE REPAIR    . CYSTOSCOPY WITH RETROGRADE PYELOGRAM, URETEROSCOPY AND STENT PLACEMENT Right 09/11/2016   Procedure: CYSTOSCOPY WITH RETROGRADE PYELOGRAM, URETEROSCOPY AND STENT REPLACEMENT;  Surgeon: Cleon Gustin, MD;  Location: [facility];  Service: Urology;  Laterality: Right;  . CYSTOSCOPY WITH STENT PLACEMENT Right 08/11/2016   Procedure: CYSTOSCOPY, URETERSCOPY,  RIGHT RETROGRADE WITH RIGHT STENT PLACEMENT;  Surgeon: Festus Aloe, MD;  Location: [facility];  Service: Urology;  Laterality: Right;  . SPLENECTOMY      Allergies  Allergies  Allergen Reactions  . Amoxicillin Other (See Comments)    "makes me crazy" Has patient had a PCN reaction causing immediate  rash, facial/tongue/throat swelling, SOB or lightheadedness with hypotension: No Has patient had a PCN reaction causing severe rash involving mucus membranes or skin necrosis: No Has patient had a PCN reaction that required hospitalization: No Has patient had a PCN reaction occurring within the last 10 years: No If all of the above answers are "NO", then may proceed with Cephalosporin use.   . Codeine Nausea And Vomiting  . Levaquin [Levofloxacin In D5w] Nausea And Vomiting  . Other Nausea And Vomiting    Pt does not want to take any pain meds    History of Present Illness    This lady has a PMH of essential hypertension, paroxysmal atrial fibrillation (not on anticoagulation-patient refused), syncope, varicose veins of both lower extremities, CAD status post PCA, COPD with chronic bronchitis, acute sinusitis, CKD stage III, and emphysema.  She is admitted to the hospital on 08/26/2019 for acute lateral STEMI and underwent PTCA of the first diagonal which was 100 % percent stenosed (reduced to 50%) no PCI was done due to the small caliber of the vessel and minimal downstream blood flow.  She had been in usual state of health until the morning of presentation when she developed severe substernal chest pain with nausea and weakness.  She indicated that she felt ill throughout the morning and eventually called EMS.  Her EKG on arrival demonstrated lateral STEMI and a code STEMI was activated.  Upon arrival to the emergency department she continued to have discomfort in her chest, felt lightheaded, and nauseous.  She indicated that she had some shortness of breath  as well.  However, she has known COPD.  Echocardiogram showed normal LVEF with suboptimal view for wall motion assessments, mild LVH and no valvular disease.  Her creatinine was elevated but appeared to be at her more recent baseline of 1.5-2.0.  Her creatinine on 08/27/2019 was 1.82.  Peak high-sensitivity troponins 3863.  Her LDL 154.   Hemoglobin A1c 6.3.  While she was ambulating with cardiac rehab her oxygen saturation dropped to 87% therefore she was discharged on supplemental oxygen, 3 L nasal cannula.  She presents to the clinic today for follow-up evaluation and states she does not want to be on clopidogrel.  She feels that being on blood thinner is too high of a risk.  It was explained that she needs this medication to keep her diagonal lesion PTCA open.  She indicates that she has occasional palpitations at night when laying down to go to sleep.  On exam today her heart rate and rhythm are regular.  I will increase her metoprolol to 25 mg twice daily due to elevated blood pressure and have her follow-up with me in 1 month for reevaluation.  I will also give her the salty 6 diet sheet and have her increase her physical activity as tolerated.  She denies chest pain, shortness of breath, lower extremity edema, fatigue, palpitations, melena, hematuria, hemoptysis, diaphoresis, weakness, presyncope, syncope, orthopnea, and PND.  Home Medications    Prior to Admission medications   Medication Sig Start Date End Date Taking? Authorizing Provider  amLODipine (NORVASC) 5 MG tablet TAKE 1 TABLET(5 MG) BY MOUTH DAILY 02/05/19   Plotnikov, Evie Lacks, MD  aspirin 81 MG chewable tablet Chew 1 tablet (81 mg total) by mouth daily. 08/29/19   Kathyrn Drown D, NP  Biotin 1000 MCG tablet Take 1,000 mcg by mouth daily.    [provider]  Cholecalciferol ([employer] VITAMIN D3) 1000 UNITS capsule Take 1,000 Units by mouth daily.      [provider]  clonazePAM (KLONOPIN) 0.5 MG tablet TAKE 1 TABLET BY MOUTH AT BEDTIME AS NEEDED FOR LEG CRAMPS Patient taking differently: Take 0.5 mg by mouth daily as needed (For leg cramps per patient).  02/05/19   Plotnikov, Evie Lacks, MD  clopidogrel (PLAVIX) 75 MG tablet Take 1 tablet (75 mg total) by mouth daily with breakfast. 08/29/19   Kathyrn Drown D, NP  cyanocobalamin 1000 MCG tablet Take  1,000 mcg by mouth daily.    [provider]  Fluticasone-Umeclidin-Vilant (TRELEGY ELLIPTA) 100-62.5-25 MCG/INH AEPB Inhale 1 Inhaler into the lungs daily. Patient taking differently: Inhale 1 Inhaler into the lungs daily as needed (For shortness of breath).  05/06/19   Plotnikov, Evie Lacks, MD  ketoconazole (NIZORAL) 2 % cream APPLY TOPICALLY TO THE AFFECTED AREA DAILY Patient taking differently: Apply 1 application topically daily as needed for irritation.  08/11/19   Plotnikov, Evie Lacks, MD  magic mouthwash w/lidocaine SOLN Take 5 mLs by mouth 4 (four) times daily as needed for mouth pain. Patient not taking: Reported on 07/23/2019 07/10/19   Plotnikov, Evie Lacks, MD  metoprolol tartrate (LOPRESSOR) 25 MG tablet TAKE 1/2 TABLET(12.5 MG) BY MOUTH TWICE DAILY Patient taking differently: Take 12.5 mg by mouth 2 (two) times daily.  02/05/19   Plotnikov, Evie Lacks, MD  nitroGLYCERIN (NITROSTAT) 0.4 MG SL tablet Place 1 tablet (0.4 mg total) under the tongue every 5 (five) minutes as needed for chest pain. 08/28/19 08/27/20  Kathyrn Drown D, NP  pantoprazole (PROTONIX) 40 MG tablet Take 1  tablet (40 mg total) by mouth daily. 02/05/19   Plotnikov, Evie Lacks, MD  rosuvastatin (CRESTOR) 20 MG tablet Take 1 tablet (20 mg total) by mouth daily. 08/29/19   Kathyrn Drown D, NP  tobramycin (TOBREX) 0.3 % ophthalmic solution Place 1 drop into both eyes every 6 (six) hours.  11/30/18   [provider]    Family History    Family History  Problem Relation Age of Onset  . Heart disease Father   . Arthritis Other        Family history of  . Coronary artery disease Other        Family history of  . Hypertension Other        Family history of  . Diabetes Daughter        20 died   She indicated that her mother is deceased. She indicated that her father is deceased. She indicated that her maternal grandmother is deceased. She indicated that her maternal grandfather is deceased. She indicated that  her paternal grandmother is deceased. She indicated that her paternal grandfather is deceased. She indicated that the status of her daughter is unknown. She indicated that the status of her other is unknown.  Social History    Social History   Socioeconomic History  . Marital status: Divorced    Spouse name: Not on file  . Number of children: 2  . Years of education: Not on file  . Highest education level: Not on file  Occupational History  . Occupation: Airline pilot: [employer]  Tobacco Use  . Smoking status: Former Smoker    Packs/day: 1.00    Types: Cigarettes    Quit date: 05/28/2008    Years since quitting: 11.5  . Smokeless tobacco: Never Used  . Tobacco comment: pt unsure how many years she smoked  Vaping Use  . Vaping Use: Never used  Substance and Sexual Activity  . Alcohol use: No  . Drug use: No  . Sexual activity: Not Currently  Other Topics Concern  . Not on file  Social History Narrative   Retired, works part time   Divorced   Former Smoker   1- Son alcoholic abusive   Daughter died in 2006/05/29   Social Determinants of Health   Financial Resource Strain: High Risk  . Difficulty of Paying Living Expenses: Very hard  Food Insecurity: Food Insecurity Present  . Worried About Charity fundraiser in the Last Year: Often true  . Ran Out of Food in the Last Year: Often true  Transportation Needs: Unmet Transportation Needs  . Lack of Transportation (Medical): Yes  . Lack of Transportation (Non-Medical): Yes  Physical Activity: Unknown  . Days of Exercise per Week: Patient refused  . Minutes of Exercise per Session: Not on file  Stress: No Stress Concern Present  . Feeling of Stress : Not at all  Social Connections:   . Frequency of Communication with Friends and Family:   . Frequency of Social Gatherings with Friends and Family:   . Attends Religious Services:   . Active Member of Clubs or Organizations:   . Attends Archivist Meetings:   Marland Kitchen  Marital Status:   Intimate Partner Violence:   . Fear of Current or Ex-Partner:   . Emotionally Abused:   Marland Kitchen Physically Abused:   . Sexually Abused:      Review of Systems    General:  No chills, fever, night sweats or weight changes.  Cardiovascular:  No chest pain, dyspnea on exertion, edema, orthopnea, palpitations, paroxysmal nocturnal dyspnea. Dermatological: No rash, lesions/masses Respiratory: No cough, dyspnea Urologic: No hematuria, dysuria Abdominal:   No nausea, vomiting, diarrhea, bright red blood per rectum, melena, or hematemesis Neurologic:  No visual changes, wkns, changes in mental status. All other systems reviewed and are otherwise negative except as noted above.  Physical Exam    VS:  BP (!) 148/78   Pulse 86   Ht 5\' 4"  (1.626 m)   Wt 126 lb (57.2 kg)   SpO2 93%   BMI 21.63 kg/m  , BMI Body mass index is 21.63 kg/m. GEN: Well nourished, well developed, in no acute distress. HEENT: normal. Neck: Supple, no JVD, carotid bruits, or masses. Cardiac: RRR, no murmurs, rubs, or gallops. No clubbing, cyanosis, edema.  Radials/DP/PT 2+ and equal bilaterally.  Respiratory:  Respirations regular and unlabored, clear to auscultation bilaterally. GI: Soft, nontender, nondistended, BS + x 4. MS: no deformity or atrophy. Skin: warm and dry, no rash. Neuro:  Strength and sensation are intact. Psych: Normal affect.  Accessory Clinical Findings    ECG personally reviewed by me today-none today.  EKG 08/29/2019 Sinus rhythm with first-degree AV block septal infarct undetermined age 63 bpm  Echocardiogram 08/27/2019 IMPRESSIONS    1. Left ventricular ejection fraction, by estimation, is 60 to 65%. The  left ventricle has normal function. Left ventricular endocardial border  not optimally defined to evaluate regional wall motion. There is mild  concentric left ventricular  hypertrophy. Left ventricular diastolic function could not be evaluated.  2. Right  ventricular systolic function is normal. The right ventricular  size is normal.  3. Left atrial size was moderately dilated.  4. The mitral valve is normal in structure. No evidence of mitral valve  regurgitation. No evidence of mitral stenosis.  5. The aortic valve is tricuspid. Aortic valve regurgitation is mild.   Cardiac catheterization 08/26/2019  1st Diag lesion is 100% stenosed.  Balloon angioplasty was performed using a BALLOON SAPPHIRE 2.0X12.  Post intervention, there is a 50% residual stenosis.  Mid LAD lesion is 30% stenosed.   1.  Total occlusion of the superior branch of the first diagonal with contrast staining, consistent with infarct vessel, treated with balloon angioplasty alone due to small vessel size and poor distal runoff 2.  Calcified coronary arteries with no significant obstructive disease in the left main, left circumflex, LAD proper, or RCA 3.  Minimally elevated LVEDP  Plan: ICU care, add clopidogrel when nausea subsides, risk reduction measures as tolerated.  Unclear whether this is a de novo plaque rupture versus an embolic event.  Patient does have known paroxysmal atrial fibrillation and has refused anticoagulation. Diagnostic Dominance: Right  Intervention     Assessment & Plan   1.  Acute STEMI-no chest pain today.  Lateral wall status post percutaneous coronary angioplasty to diagonal branch 08/26/2019.  Echocardiogram showed LVEF 60-65%, suboptimal view for wall motion assessment, mild LVH, no valvular disease Continue aspirin, Plavix, atorvastatin, amlodipine, nitroglycerin  Increase metoprolol to 25 mg twice daily Heart healthy low-sodium diet-salty 6 given Increase physical activity as tolerated  PAF-heart rate today regular 86 bpm.  No recent episodes of palpitations. Continue metoprolol, aspirin, Plavix Heart healthy low-sodium diet-salty 6 given Increase physical activity as tolerated  Essential hypertension-BP today 148/78  well-controlled at home.  Holding ACE/ARB due to renal function. Continue amlodipine, Increase metoprolol to 25 mg twice daily Heart healthy low-sodium diet-salty 6 given Increase physical activity as  tolerated  Hyperlipidemia-goal less than 70.  Low-dose rosuvastatin prescribed due to advanced age. Continue rosuvastatin 20 mg daily Heart healthy low-sodium high-fiber diet Increase physical activity as tolerated  COPD with chronic bronchitis-presents on room air today.  No increased work of breathing today. Follow-up with pulmonary/PCP  Disposition: Follow-up with me in 1 months.   Jossie Ng. Kenedi Cilia NP-C    09/10/2019, 10:48 AM [address] Office [phone] Fax [phone]

## 2019-09-09 NOTE — Telephone Encounter (Signed)
Meriam Sprague, Home Health Worker would like verbal orders for the patient to continue with social services. Please call

## 2019-09-09 NOTE — Telephone Encounter (Signed)
Okay to give verbal orders?   Thanks!

## 2019-09-09 NOTE — Telephone Encounter (Signed)
Spoke with cindy, verbal order given.

## 2019-09-09 NOTE — Telephone Encounter (Signed)
Ok  [name]  

## 2019-09-10 ENCOUNTER — Other Ambulatory Visit: Payer: Self-pay

## 2019-09-10 ENCOUNTER — Ambulatory Visit: Payer: Medicare Other | Admitting: General Practice

## 2019-09-10 ENCOUNTER — Encounter: Payer: Self-pay | Admitting: General Practice

## 2019-09-10 ENCOUNTER — Ambulatory Visit (INDEPENDENT_AMBULATORY_CARE_PROVIDER_SITE_OTHER): Payer: Medicare Other | Admitting: General Practice

## 2019-09-10 VITALS — BP 148/78 | HR 86 | Ht 64.0 in | Wt 126.0 lb

## 2019-09-10 DIAGNOSIS — E785 Hyperlipidemia, unspecified: Secondary | ICD-10-CM | POA: Diagnosis not present

## 2019-09-10 DIAGNOSIS — I480 Paroxysmal atrial fibrillation: Secondary | ICD-10-CM | POA: Diagnosis not present

## 2019-09-10 DIAGNOSIS — J449 Chronic obstructive pulmonary disease, unspecified: Secondary | ICD-10-CM

## 2019-09-10 DIAGNOSIS — I2129 ST elevation (STEMI) myocardial infarction involving other sites: Secondary | ICD-10-CM | POA: Diagnosis not present

## 2019-09-10 DIAGNOSIS — I10 Essential (primary) hypertension: Secondary | ICD-10-CM

## 2019-09-10 MED ORDER — METOPROLOL TARTRATE 25 MG PO TABS: 25.0000 mg | ORAL_TABLET | Freq: Two times a day (BID) | ORAL | 3 refills | Status: DC

## 2019-09-10 NOTE — Patient Instructions (Signed)
Medication Instructions:  INCREASE METOPROLOL 25MG  TWICE DAILY *If you need a refill on your cardiac medications before your next appointment, please call your pharmacy*  Special Instructions PLEASE READ AND FOLLOW SALTY 6-ATTACHED  PLEASE INCREASE PHYSICAL ACTIVITY AS TOLERATED  Follow-Up: Your next appointment:  1 month(s) In Person with Coletta Memos, FNP  At [facility], you and your health needs are our priority.  As part of our continuing mission to provide you with exceptional heart care, we have created designated Provider Care Teams.  These Care Teams include your primary Cardiologist (physician) and Advanced Practice Providers (APPs -  Physician Assistants and Nurse Practitioners) who all work together to provide you with the care you need, when you need it.  We recommend signing up for the patient portal called "MyChart".  Sign up information is provided on this After Visit Summary.  MyChart is used to connect with patients for Virtual Visits (Telemedicine).  Patients are able to view lab/test results, encounter notes, upcoming appointments, etc.  Non-urgent messages can be sent to your provider as well.   To learn more about what you can do with MyChart, go to [email or web address].

## 2019-09-12 ENCOUNTER — Encounter (HOSPITAL_COMMUNITY): Payer: Self-pay | Admitting: Emergency Medicine

## 2019-09-12 ENCOUNTER — Emergency Department (HOSPITAL_COMMUNITY)
Admission: EM | Admit: 2019-09-12 | Discharge: 2019-09-13 | Disposition: A | Discharge status: Home / Self Care | Payer: Medicare Other | Attending: Emergency Medicine | Admitting: Emergency Medicine

## 2019-09-12 ENCOUNTER — Emergency Department (HOSPITAL_COMMUNITY): Payer: Medicare Other

## 2019-09-12 ENCOUNTER — Other Ambulatory Visit: Payer: Self-pay

## 2019-09-12 ENCOUNTER — Telehealth: Payer: Self-pay | Admitting: Cardiology

## 2019-09-12 DIAGNOSIS — I499 Cardiac arrhythmia, unspecified: Secondary | ICD-10-CM | POA: Diagnosis not present

## 2019-09-12 DIAGNOSIS — E559 Vitamin D deficiency, unspecified: Secondary | ICD-10-CM | POA: Diagnosis not present

## 2019-09-12 DIAGNOSIS — J449 Chronic obstructive pulmonary disease, unspecified: Secondary | ICD-10-CM | POA: Diagnosis not present

## 2019-09-12 DIAGNOSIS — I2510 Atherosclerotic heart disease of native coronary artery without angina pectoris: Secondary | ICD-10-CM | POA: Diagnosis not present

## 2019-09-12 DIAGNOSIS — E538 Deficiency of other specified B group vitamins: Secondary | ICD-10-CM | POA: Diagnosis not present

## 2019-09-12 DIAGNOSIS — E785 Hyperlipidemia, unspecified: Secondary | ICD-10-CM | POA: Diagnosis not present

## 2019-09-12 DIAGNOSIS — K279 Peptic ulcer, site unspecified, unspecified as acute or chronic, without hemorrhage or perforation: Secondary | ICD-10-CM | POA: Diagnosis not present

## 2019-09-12 DIAGNOSIS — G9389 Other specified disorders of brain: Secondary | ICD-10-CM | POA: Diagnosis not present

## 2019-09-12 DIAGNOSIS — I672 Cerebral atherosclerosis: Secondary | ICD-10-CM | POA: Diagnosis not present

## 2019-09-12 DIAGNOSIS — I130 Hypertensive heart and chronic kidney disease with heart failure and stage 1 through stage 4 chronic kidney disease, or unspecified chronic kidney disease: Secondary | ICD-10-CM | POA: Diagnosis not present

## 2019-09-12 DIAGNOSIS — Z5321 Procedure and treatment not carried out due to patient leaving prior to being seen by health care provider: Secondary | ICD-10-CM | POA: Diagnosis not present

## 2019-09-12 DIAGNOSIS — I5031 Acute diastolic (congestive) heart failure: Secondary | ICD-10-CM | POA: Diagnosis not present

## 2019-09-12 DIAGNOSIS — I5033 Acute on chronic diastolic (congestive) heart failure: Secondary | ICD-10-CM | POA: Diagnosis not present

## 2019-09-12 DIAGNOSIS — Z743 Need for continuous supervision: Secondary | ICD-10-CM | POA: Diagnosis not present

## 2019-09-12 DIAGNOSIS — J9621 Acute and chronic respiratory failure with hypoxia: Secondary | ICD-10-CM | POA: Diagnosis not present

## 2019-09-12 DIAGNOSIS — I5021 Acute systolic (congestive) heart failure: Secondary | ICD-10-CM | POA: Diagnosis not present

## 2019-09-12 DIAGNOSIS — I480 Paroxysmal atrial fibrillation: Secondary | ICD-10-CM | POA: Diagnosis not present

## 2019-09-12 DIAGNOSIS — R778 Other specified abnormalities of plasma proteins: Secondary | ICD-10-CM | POA: Diagnosis not present

## 2019-09-12 DIAGNOSIS — M19042 Primary osteoarthritis, left hand: Secondary | ICD-10-CM | POA: Diagnosis not present

## 2019-09-12 DIAGNOSIS — I248 Other forms of acute ischemic heart disease: Secondary | ICD-10-CM | POA: Diagnosis not present

## 2019-09-12 DIAGNOSIS — I700 Atherosclerosis of aorta: Secondary | ICD-10-CM | POA: Diagnosis not present

## 2019-09-12 DIAGNOSIS — I161 Hypertensive emergency: Secondary | ICD-10-CM | POA: Diagnosis not present

## 2019-09-12 DIAGNOSIS — N184 Chronic kidney disease, stage 4 (severe): Secondary | ICD-10-CM | POA: Diagnosis not present

## 2019-09-12 DIAGNOSIS — J439 Emphysema, unspecified: Secondary | ICD-10-CM | POA: Diagnosis not present

## 2019-09-12 DIAGNOSIS — E876 Hypokalemia: Secondary | ICD-10-CM | POA: Diagnosis not present

## 2019-09-12 DIAGNOSIS — F411 Generalized anxiety disorder: Secondary | ICD-10-CM | POA: Diagnosis not present

## 2019-09-12 DIAGNOSIS — I252 Old myocardial infarction: Secondary | ICD-10-CM | POA: Diagnosis not present

## 2019-09-12 DIAGNOSIS — N1832 Chronic kidney disease, stage 3b: Secondary | ICD-10-CM | POA: Diagnosis not present

## 2019-09-12 DIAGNOSIS — K219 Gastro-esophageal reflux disease without esophagitis: Secondary | ICD-10-CM | POA: Diagnosis not present

## 2019-09-12 DIAGNOSIS — J810 Acute pulmonary edema: Secondary | ICD-10-CM | POA: Diagnosis not present

## 2019-09-12 DIAGNOSIS — I2129 ST elevation (STEMI) myocardial infarction involving other sites: Secondary | ICD-10-CM | POA: Diagnosis not present

## 2019-09-12 DIAGNOSIS — R0602 Shortness of breath: Secondary | ICD-10-CM | POA: Diagnosis not present

## 2019-09-12 DIAGNOSIS — J90 Pleural effusion, not elsewhere classified: Secondary | ICD-10-CM | POA: Diagnosis not present

## 2019-09-12 DIAGNOSIS — N179 Acute kidney failure, unspecified: Secondary | ICD-10-CM | POA: Diagnosis not present

## 2019-09-12 DIAGNOSIS — Z8249 Family history of ischemic heart disease and other diseases of the circulatory system: Secondary | ICD-10-CM | POA: Diagnosis not present

## 2019-09-12 DIAGNOSIS — Z9861 Coronary angioplasty status: Secondary | ICD-10-CM | POA: Diagnosis not present

## 2019-09-12 DIAGNOSIS — R29818 Other symptoms and signs involving the nervous system: Secondary | ICD-10-CM | POA: Diagnosis not present

## 2019-09-12 DIAGNOSIS — I129 Hypertensive chronic kidney disease with stage 1 through stage 4 chronic kidney disease, or unspecified chronic kidney disease: Secondary | ICD-10-CM | POA: Diagnosis not present

## 2019-09-12 DIAGNOSIS — I110 Hypertensive heart disease with heart failure: Secondary | ICD-10-CM | POA: Diagnosis not present

## 2019-09-12 DIAGNOSIS — I351 Nonrheumatic aortic (valve) insufficiency: Secondary | ICD-10-CM | POA: Diagnosis not present

## 2019-09-12 DIAGNOSIS — J9811 Atelectasis: Secondary | ICD-10-CM | POA: Diagnosis not present

## 2019-09-12 DIAGNOSIS — N1831 Chronic kidney disease, stage 3a: Secondary | ICD-10-CM | POA: Diagnosis not present

## 2019-09-12 DIAGNOSIS — R6889 Other general symptoms and signs: Secondary | ICD-10-CM | POA: Diagnosis not present

## 2019-09-12 DIAGNOSIS — R000 Tachycardia, unspecified: Secondary | ICD-10-CM | POA: Diagnosis not present

## 2019-09-12 DIAGNOSIS — M19041 Primary osteoarthritis, right hand: Secondary | ICD-10-CM | POA: Diagnosis not present

## 2019-09-12 DIAGNOSIS — Z20822 Contact with and (suspected) exposure to covid-19: Secondary | ICD-10-CM | POA: Diagnosis not present

## 2019-09-12 DIAGNOSIS — M1990 Unspecified osteoarthritis, unspecified site: Secondary | ICD-10-CM | POA: Diagnosis not present

## 2019-09-12 LAB — CBC WITH DIFFERENTIAL/PLATELET
Abs Immature Granulocytes: 0.04 10*3/uL (ref 0.00–0.07)
Basophils Absolute: 0.1 10*3/uL (ref 0.0–0.1)
Basophils Relative: 1 %
Eosinophils Absolute: 0.3 10*3/uL (ref 0.0–0.5)
Eosinophils Relative: 3 %
HCT: 42.9 % (ref 36.0–46.0)
Hemoglobin: 13.5 g/dL (ref 12.0–15.0)
Immature Granulocytes: 0 %
Lymphocytes Relative: 27 %
Lymphs Abs: 2.7 10*3/uL (ref 0.7–4.0)
MCH: 29.7 pg (ref 26.0–34.0)
MCHC: 31.5 g/dL (ref 30.0–36.0)
MCV: 94.5 fL (ref 80.0–100.0)
Monocytes Absolute: 1.3 10*3/uL — ABNORMAL HIGH (ref 0.1–1.0)
Monocytes Relative: 12 %
Neutro Abs: 5.9 10*3/uL (ref 1.7–7.7)
Neutrophils Relative %: 57 %
Platelets: 410 10*3/uL — ABNORMAL HIGH (ref 150–400)
RBC: 4.54 MIL/uL (ref 3.87–5.11)
RDW: 15.8 % — ABNORMAL HIGH (ref 11.5–15.5)
WBC: 10.3 10*3/uL (ref 4.0–10.5)
nRBC: 0 % (ref 0.0–0.2)

## 2019-09-12 LAB — COMPREHENSIVE METABOLIC PANEL
ALT: 15 U/L (ref 0–44)
AST: 27 U/L (ref 15–41)
Albumin: 3.5 g/dL (ref 3.5–5.0)
Alkaline Phosphatase: 50 U/L (ref 38–126)
Anion gap: 11 (ref 5–15)
BUN: 18 mg/dL (ref 8–23)
CO2: 24 mmol/L (ref 22–32)
Calcium: 8.8 mg/dL — ABNORMAL LOW (ref 8.9–10.3)
Chloride: 105 mmol/L (ref 98–111)
Creatinine, Ser: 1.54 mg/dL — ABNORMAL HIGH (ref 0.44–1.00)
GFR calc Af Amer: 36 mL/min — ABNORMAL LOW (ref >60)
GFR calc non Af Amer: 31 mL/min — ABNORMAL LOW (ref >60)
Glucose, Bld: 101 mg/dL — ABNORMAL HIGH (ref 70–99)
Potassium: 3.5 mmol/L (ref 3.5–5.1)
Sodium: 140 mmol/L (ref 135–145)
Total Bilirubin: 1 mg/dL (ref 0.3–1.2)
Total Protein: 6.6 g/dL (ref 6.5–8.1)

## 2019-09-12 NOTE — ED Triage Notes (Signed)
Patient reports SOB for several days worse when lying flat with edema at feet / hypertensive at triage .

## 2019-09-12 NOTE — Telephone Encounter (Signed)
Left message for chelsea, she is not on the DPR. Spoke with pt, chelsea is the [facility] nurse that came to her house. The patient reports she has swelling in her feet and ankles and is unable to wear her shoes. The nurse told her she had fluid in her lungs. The patient reports she can not lie down because she can not breathe, she sleeps in the recliner.  She reports SOB talking to me on the phone and with any exertion. She feels she is out of rhythm. Advised the patient she needs to go to the ER for evaluation. She reports she may not go until the morning. Advised the patient that she needs to go tonight. Patient voiced understanding

## 2019-09-12 NOTE — Telephone Encounter (Signed)
New message   [name] states that the patient is in Afib and is not on any medication to control this. Please call to discuss.

## 2019-09-12 NOTE — Telephone Encounter (Signed)
Spoke to [place] from home health service who report pt is being non compliant with her medication and refusing to take plavix, crestor, and amlodipine. She voiced pt state she does not want to take anything that wasn't prescribed by Dr.Hochrien. Chelsea voiced she is concerned because pt is still a little SOB and HR is averaging between 42-115.   Nurse informed Vikki Ports that office is currently closed but would route message to MD to make aware. Nurse also advised if SOB or HR remain elevated to report to ER for further evaluations. Chelsea verbalized understanding.

## 2019-09-13 NOTE — ED Notes (Signed)
Pt stated her decision to leave [facility] ED. Pt was encouraged to stay and see a healthcare provider, and she was informed that her wait time would restart if she chose to leave and later return to [facility] ED. Pt stated that she will see her primary healthcare provider next Tuesday and will wait until then to receive a medical consult and treatment.

## 2019-09-14 NOTE — Telephone Encounter (Signed)
Patient went to ED on  7/9

## 2019-09-15 ENCOUNTER — Telehealth: Payer: Self-pay | Admitting: Internal Medicine

## 2019-09-15 ENCOUNTER — Emergency Department (HOSPITAL_COMMUNITY): Payer: Medicare Other

## 2019-09-15 ENCOUNTER — Telehealth: Payer: Self-pay | Admitting: Cardiology

## 2019-09-15 ENCOUNTER — Other Ambulatory Visit: Payer: Self-pay

## 2019-09-15 ENCOUNTER — Inpatient Hospital Stay (HOSPITAL_COMMUNITY)
Admission: EM | Admit: 2019-09-15 | Discharge: 2019-09-19 | DRG: 291 | Disposition: A | Discharge status: Home Health | Payer: Medicare Other | Attending: Internal Medicine | Admitting: Internal Medicine

## 2019-09-15 ENCOUNTER — Encounter (HOSPITAL_COMMUNITY): Payer: Self-pay

## 2019-09-15 DIAGNOSIS — Z20822 Contact with and (suspected) exposure to covid-19: Secondary | ICD-10-CM | POA: Diagnosis present

## 2019-09-15 DIAGNOSIS — Z9861 Coronary angioplasty status: Secondary | ICD-10-CM | POA: Diagnosis not present

## 2019-09-15 DIAGNOSIS — I252 Old myocardial infarction: Secondary | ICD-10-CM | POA: Diagnosis not present

## 2019-09-15 DIAGNOSIS — I5021 Acute systolic (congestive) heart failure: Secondary | ICD-10-CM | POA: Diagnosis not present

## 2019-09-15 DIAGNOSIS — J810 Acute pulmonary edema: Secondary | ICD-10-CM | POA: Diagnosis present

## 2019-09-15 DIAGNOSIS — E876 Hypokalemia: Secondary | ICD-10-CM | POA: Diagnosis not present

## 2019-09-15 DIAGNOSIS — E785 Hyperlipidemia, unspecified: Secondary | ICD-10-CM

## 2019-09-15 DIAGNOSIS — N179 Acute kidney failure, unspecified: Secondary | ICD-10-CM

## 2019-09-15 DIAGNOSIS — M79631 Pain in right forearm: Secondary | ICD-10-CM | POA: Diagnosis present

## 2019-09-15 DIAGNOSIS — I248 Other forms of acute ischemic heart disease: Secondary | ICD-10-CM | POA: Diagnosis not present

## 2019-09-15 DIAGNOSIS — Z79899 Other long term (current) drug therapy: Secondary | ICD-10-CM

## 2019-09-15 DIAGNOSIS — J449 Chronic obstructive pulmonary disease, unspecified: Secondary | ICD-10-CM

## 2019-09-15 DIAGNOSIS — N1832 Chronic kidney disease, stage 3b: Secondary | ICD-10-CM | POA: Diagnosis present

## 2019-09-15 DIAGNOSIS — R29818 Other symptoms and signs involving the nervous system: Secondary | ICD-10-CM | POA: Diagnosis not present

## 2019-09-15 DIAGNOSIS — Z8249 Family history of ischemic heart disease and other diseases of the circulatory system: Secondary | ICD-10-CM | POA: Diagnosis not present

## 2019-09-15 DIAGNOSIS — M19042 Primary osteoarthritis, left hand: Secondary | ICD-10-CM | POA: Diagnosis present

## 2019-09-15 DIAGNOSIS — Z885 Allergy status to narcotic agent status: Secondary | ICD-10-CM

## 2019-09-15 DIAGNOSIS — I4819 Other persistent atrial fibrillation: Secondary | ICD-10-CM | POA: Diagnosis present

## 2019-09-15 DIAGNOSIS — I5033 Acute on chronic diastolic (congestive) heart failure: Secondary | ICD-10-CM | POA: Diagnosis not present

## 2019-09-15 DIAGNOSIS — I161 Hypertensive emergency: Secondary | ICD-10-CM | POA: Diagnosis not present

## 2019-09-15 DIAGNOSIS — K219 Gastro-esophageal reflux disease without esophagitis: Secondary | ICD-10-CM | POA: Diagnosis not present

## 2019-09-15 DIAGNOSIS — I2510 Atherosclerotic heart disease of native coronary artery without angina pectoris: Secondary | ICD-10-CM

## 2019-09-15 DIAGNOSIS — I130 Hypertensive heart and chronic kidney disease with heart failure and stage 1 through stage 4 chronic kidney disease, or unspecified chronic kidney disease: Secondary | ICD-10-CM | POA: Diagnosis not present

## 2019-09-15 DIAGNOSIS — R7989 Other specified abnormal findings of blood chemistry: Secondary | ICD-10-CM | POA: Diagnosis present

## 2019-09-15 DIAGNOSIS — J4489 Other specified chronic obstructive pulmonary disease: Secondary | ICD-10-CM | POA: Diagnosis present

## 2019-09-15 DIAGNOSIS — N1831 Chronic kidney disease, stage 3a: Secondary | ICD-10-CM

## 2019-09-15 DIAGNOSIS — R0602 Shortness of breath: Secondary | ICD-10-CM | POA: Diagnosis not present

## 2019-09-15 DIAGNOSIS — I5031 Acute diastolic (congestive) heart failure: Secondary | ICD-10-CM

## 2019-09-15 DIAGNOSIS — J90 Pleural effusion, not elsewhere classified: Secondary | ICD-10-CM | POA: Diagnosis not present

## 2019-09-15 DIAGNOSIS — M1990 Unspecified osteoarthritis, unspecified site: Secondary | ICD-10-CM | POA: Diagnosis not present

## 2019-09-15 DIAGNOSIS — G9389 Other specified disorders of brain: Secondary | ICD-10-CM | POA: Diagnosis not present

## 2019-09-15 DIAGNOSIS — Z833 Family history of diabetes mellitus: Secondary | ICD-10-CM

## 2019-09-15 DIAGNOSIS — J9621 Acute and chronic respiratory failure with hypoxia: Secondary | ICD-10-CM | POA: Diagnosis not present

## 2019-09-15 DIAGNOSIS — R778 Other specified abnormalities of plasma proteins: Secondary | ICD-10-CM

## 2019-09-15 DIAGNOSIS — E559 Vitamin D deficiency, unspecified: Secondary | ICD-10-CM | POA: Diagnosis present

## 2019-09-15 DIAGNOSIS — Z9081 Acquired absence of spleen: Secondary | ICD-10-CM

## 2019-09-15 DIAGNOSIS — I480 Paroxysmal atrial fibrillation: Secondary | ICD-10-CM | POA: Diagnosis not present

## 2019-09-15 DIAGNOSIS — M19041 Primary osteoarthritis, right hand: Secondary | ICD-10-CM | POA: Diagnosis present

## 2019-09-15 DIAGNOSIS — N183 Chronic kidney disease, stage 3 unspecified: Secondary | ICD-10-CM | POA: Diagnosis present

## 2019-09-15 DIAGNOSIS — Z7902 Long term (current) use of antithrombotics/antiplatelets: Secondary | ICD-10-CM

## 2019-09-15 DIAGNOSIS — I672 Cerebral atherosclerosis: Secondary | ICD-10-CM | POA: Diagnosis not present

## 2019-09-15 DIAGNOSIS — I110 Hypertensive heart disease with heart failure: Secondary | ICD-10-CM | POA: Diagnosis not present

## 2019-09-15 DIAGNOSIS — Z881 Allergy status to other antibiotic agents status: Secondary | ICD-10-CM

## 2019-09-15 DIAGNOSIS — J9811 Atelectasis: Secondary | ICD-10-CM | POA: Diagnosis not present

## 2019-09-15 DIAGNOSIS — K279 Peptic ulcer, site unspecified, unspecified as acute or chronic, without hemorrhage or perforation: Secondary | ICD-10-CM | POA: Diagnosis not present

## 2019-09-15 DIAGNOSIS — F411 Generalized anxiety disorder: Secondary | ICD-10-CM

## 2019-09-15 DIAGNOSIS — Z888 Allergy status to other drugs, medicaments and biological substances status: Secondary | ICD-10-CM

## 2019-09-15 DIAGNOSIS — F329 Major depressive disorder, single episode, unspecified: Secondary | ICD-10-CM | POA: Diagnosis present

## 2019-09-15 DIAGNOSIS — Z87891 Personal history of nicotine dependence: Secondary | ICD-10-CM

## 2019-09-15 DIAGNOSIS — Z7982 Long term (current) use of aspirin: Secondary | ICD-10-CM

## 2019-09-15 LAB — URINALYSIS, ROUTINE W REFLEX MICROSCOPIC
Bacteria, UA: NONE SEEN
Bilirubin Urine: NEGATIVE
Glucose, UA: NEGATIVE mg/dL
Hgb urine dipstick: NEGATIVE
Ketones, ur: NEGATIVE mg/dL
Leukocytes,Ua: NEGATIVE
Nitrite: NEGATIVE
Protein, ur: 100 mg/dL — AB
Specific Gravity, Urine: 1.005 (ref 1.005–1.030)
pH: 7 (ref 5.0–8.0)

## 2019-09-15 LAB — COMPREHENSIVE METABOLIC PANEL
ALT: 17 U/L (ref 0–44)
AST: 33 U/L (ref 15–41)
Albumin: 3.4 g/dL — ABNORMAL LOW (ref 3.5–5.0)
Alkaline Phosphatase: 51 U/L (ref 38–126)
Anion gap: 11 (ref 5–15)
BUN: 15 mg/dL (ref 8–23)
CO2: 26 mmol/L (ref 22–32)
Calcium: 8.7 mg/dL — ABNORMAL LOW (ref 8.9–10.3)
Chloride: 106 mmol/L (ref 98–111)
Creatinine, Ser: 1.34 mg/dL — ABNORMAL HIGH (ref 0.44–1.00)
GFR calc Af Amer: 43 mL/min — ABNORMAL LOW (ref >60)
GFR calc non Af Amer: 37 mL/min — ABNORMAL LOW (ref >60)
Glucose, Bld: 90 mg/dL (ref 70–99)
Potassium: 3 mmol/L — ABNORMAL LOW (ref 3.5–5.1)
Sodium: 143 mmol/L (ref 135–145)
Total Bilirubin: 0.8 mg/dL (ref 0.3–1.2)
Total Protein: 6.2 g/dL — ABNORMAL LOW (ref 6.5–8.1)

## 2019-09-15 LAB — APTT: aPTT: 31 seconds (ref 24–36)

## 2019-09-15 LAB — CBC WITH DIFFERENTIAL/PLATELET
Abs Immature Granulocytes: 0.05 10*3/uL (ref 0.00–0.07)
Basophils Absolute: 0.1 10*3/uL (ref 0.0–0.1)
Basophils Relative: 1 %
Eosinophils Absolute: 0.1 10*3/uL (ref 0.0–0.5)
Eosinophils Relative: 1 %
HCT: 41.8 % (ref 36.0–46.0)
Hemoglobin: 13.7 g/dL (ref 12.0–15.0)
Immature Granulocytes: 0 %
Lymphocytes Relative: 18 %
Lymphs Abs: 2 10*3/uL (ref 0.7–4.0)
MCH: 31 pg (ref 26.0–34.0)
MCHC: 32.8 g/dL (ref 30.0–36.0)
MCV: 94.6 fL (ref 80.0–100.0)
Monocytes Absolute: 1.1 10*3/uL — ABNORMAL HIGH (ref 0.1–1.0)
Monocytes Relative: 10 %
Neutro Abs: 7.7 10*3/uL (ref 1.7–7.7)
Neutrophils Relative %: 70 %
Platelets: 347 10*3/uL (ref 150–400)
RBC: 4.42 MIL/uL (ref 3.87–5.11)
RDW: 15.6 % — ABNORMAL HIGH (ref 11.5–15.5)
WBC: 11.1 10*3/uL — ABNORMAL HIGH (ref 4.0–10.5)
nRBC: 0 % (ref 0.0–0.2)

## 2019-09-15 LAB — SEDIMENTATION RATE: Sed Rate: 15 mm/hr (ref 0–22)

## 2019-09-15 LAB — PROTIME-INR
INR: 1 (ref 0.8–1.2)
Prothrombin Time: 12.3 seconds (ref 11.4–15.2)

## 2019-09-15 LAB — TROPONIN I (HIGH SENSITIVITY)
Troponin I (High Sensitivity): 98 ng/L — ABNORMAL HIGH (ref <18)
Troponin I (High Sensitivity): 135 ng/L (ref <18)
Troponin I (High Sensitivity): 137 ng/L (ref <18)

## 2019-09-15 LAB — BRAIN NATRIURETIC PEPTIDE: B Natriuretic Peptide: 2581.2 pg/mL — ABNORMAL HIGH (ref 0.0–100.0)

## 2019-09-15 MED ORDER — NYSTATIN 100000 UNIT/GM EX POWD
Freq: Three times a day (TID) | CUTANEOUS | Status: DC
  Filled 2019-09-15 (×3): qty 15

## 2019-09-15 MED ORDER — FUROSEMIDE 10 MG/ML IJ SOLN
20.0000 mg | Freq: Once | INTRAMUSCULAR | Status: AC
  Administered 2019-09-15: 20 mg via INTRAVENOUS
  Filled 2019-09-15: qty 4

## 2019-09-15 MED ORDER — HEPARIN SODIUM (PORCINE) 5000 UNIT/ML IJ SOLN
60.0000 [IU]/kg | Freq: Once | INTRAMUSCULAR | Status: AC
  Administered 2019-09-15: 3400 [IU] via INTRAVENOUS
  Filled 2019-09-15: qty 1

## 2019-09-15 MED ORDER — SODIUM CHLORIDE 0.9 % IV SOLN: INTRAVENOUS | Status: DC

## 2019-09-15 MED ORDER — NITROGLYCERIN IN D5W 200-5 MCG/ML-% IV SOLN
0.0000 ug/min | INTRAVENOUS | Status: DC
  Administered 2019-09-15: 50 ug/min via INTRAVENOUS
  Administered 2019-09-16: 55 ug/min via INTRAVENOUS
  Filled 2019-09-15 (×2): qty 250

## 2019-09-15 MED ORDER — METOPROLOL TARTRATE 25 MG PO TABS
25.0000 mg | ORAL_TABLET | Freq: Two times a day (BID) | ORAL | Status: DC
  Administered 2019-09-15 – 2019-09-17 (×5): 25 mg via ORAL
  Filled 2019-09-15 (×5): qty 1

## 2019-09-15 MED ORDER — ROSUVASTATIN CALCIUM 20 MG PO TABS
20.0000 mg | ORAL_TABLET | Freq: Every day | ORAL | Status: DC
  Administered 2019-09-15 – 2019-09-19 (×5): 20 mg via ORAL
  Filled 2019-09-15 (×5): qty 1

## 2019-09-15 MED ORDER — POTASSIUM CHLORIDE CRYS ER 20 MEQ PO TBCR
40.0000 meq | EXTENDED_RELEASE_TABLET | Freq: Once | ORAL | Status: AC
  Administered 2019-09-16: 40 meq via ORAL
  Filled 2019-09-15: qty 2

## 2019-09-15 MED ORDER — ASPIRIN 81 MG PO CHEW
324.0000 mg | CHEWABLE_TABLET | Freq: Once | ORAL | Status: AC
  Administered 2019-09-15: 324 mg via ORAL
  Filled 2019-09-15: qty 4

## 2019-09-15 MED ORDER — AMLODIPINE BESYLATE 5 MG PO TABS
5.0000 mg | ORAL_TABLET | Freq: Every day | ORAL | Status: DC
  Administered 2019-09-15 – 2019-09-16 (×2): 5 mg via ORAL
  Filled 2019-09-15 (×2): qty 1

## 2019-09-15 MED ORDER — POTASSIUM CHLORIDE 10 MEQ/100ML IV SOLN
10.0000 meq | INTRAVENOUS | Status: AC
  Administered 2019-09-16 (×2): 10 meq via INTRAVENOUS
  Filled 2019-09-15 (×4): qty 100

## 2019-09-15 NOTE — ED Notes (Signed)
Bedside commode in pt's room at bedside.

## 2019-09-15 NOTE — Telephone Encounter (Signed)
[facility] Report/Call : ---Caller states her BP 190/100. She had a heart attack two weeks ago. Back pain, shortness of breath. Feet are swelled and bruising all over body. Vision blurry. she is by herself.  Advised - Call EMS now.

## 2019-09-15 NOTE — Telephone Encounter (Signed)
Spoke with chasie, aware the only medications we are aware of are in Epic.

## 2019-09-15 NOTE — ED Notes (Signed)
PT refused COVID test

## 2019-09-15 NOTE — Telephone Encounter (Signed)
Pls see me, another PC or f/u w/Cardiology. Go to ER if feeling really bad. Thx

## 2019-09-15 NOTE — ED Triage Notes (Signed)
Pt came via EMS for HTN and blurred vision for 2 days. c/o SOB and lower extremity swelling.

## 2019-09-15 NOTE — ED Provider Notes (Addendum)
[facility] DEPT Provider Note   CSN: [id number] Arrival date & time: 09/15/19  1023     History Chief Complaint  Patient presents with  . Hypertension    [name] is a 84 y.o. female.  HPI     84 year old female comes in a chief complaint of elevated blood pressure. Patient is status post stent placement for MI from end of June.  She has history of A. fib, COPD, PUD, anxiety.  Patient reports that over the last 2 days she has had blurry vision.  She only sees through her right eye.  She is also noted increased swelling in her legs and shortness of breath.  Shortness of breath is exertional and also when she is laying flat.  She denies any chest pain.   Past Medical History:  Diagnosis Date  . Anxiety   . Arthritis   . Atrial fibrillation ([place])   . B12 deficiency   . COPD (chronic obstructive pulmonary disease) ([place])   . Depression   . GERD (gastroesophageal reflux disease)   . Hypertension   . Osteoarthrosis, hand    both hands  . Peptic ulcer, unspecified site, unspecified as acute or chronic, without mention of hemorrhage, perforation, or obstruction   . Pneumonia   . PONV (postoperative nausea and vomiting)   . Vitamin D deficiency disease     Patient Active Problem List   Diagnosis Date Noted  . Hyperlipidemia with target LDL less than 70 08/27/2019  . Acute ST elevation myocardial infarction (STEMI) of lateral wall ([place]) 08/26/2019  . Heart attack ([place]) 08/26/2019  . Ankle pain, left 07/23/2019  . Intertrigo 02/05/2019  . Thrush 01/10/2019  . Shingles 09/16/2018  . CAD S/P percutaneous coronary angioplasty 05/06/2018  . Esophageal dysmotility 01/11/2018  . Weight gain 01/01/2018  . Abdominal bloating 01/01/2018  . Hyperkalemia 06/04/2017  . Grief at loss of child 04/02/2017  . Adenopathy, cervical 11/29/2016  . Dysuria 10/31/2016  . Vertigo 10/31/2016  . Ureteral calculus 09/11/2016  . CAP (community acquired  pneumonia) 08/30/2016  . Fatigue 08/24/2016  . Sensation of pressure in bladder area 08/24/2016  . Kidney stone 08/13/2016  . Flank pain 08/10/2016  . Right upper quadrant abdominal tenderness without rebound tenderness 08/10/2016  . Leukocytosis 08/10/2016  . Acute abdomen 08/10/2016  . Intractable nausea and vomiting 08/10/2016  . ARF (acute renal failure) ([place]) 08/10/2016  . Urolithiasis 08/10/2016  . Abdominal pain 08/10/2016  . Anemia 05/19/2016  . Generalized anxiety disorder 01/14/2016  . Dysphagia 04/16/2015  . Dry skin dermatitis 12/28/2014  . Atrophic vaginitis 05/26/2014  . Varicose veins of both lower extremities 12/12/2013  . Restless leg syndrome 03/17/2013  . Impacted cerumen of left ear 03/17/2013  . Osteoarthritis, hand 09/05/2011  . Epistaxis, recurrent 08/17/2011  . Well adult exam 01/31/2011  . Low back pain 01/31/2011  . Palpitations 05/24/2010  . Cerumen impaction 02/23/2010  . PHARYNGITIS, ACUTE 02/23/2010  . SINUSITIS, ACUTE 01/07/2010  . SOB (shortness of breath) 09/23/2009  . BRONCHITIS, ACUTE 08/03/2009  . Osteoarthritis 07/06/2009  . TOBACCO USE, QUIT 03/08/2009  . ALLERGIC RHINITIS 11/06/2008  . NAUSEA ALONE 08/19/2008  . DIARRHEA 08/19/2008  . Essential hypertension 07/15/2008  . EMPHYSEMA 07/15/2008  . PEPTIC ULCER DISEASE 07/15/2008  . ARTHRITIS 07/15/2008  . HEADACHE 05/25/2008  . SYNCOPE 03/23/2008  . WEIGHT LOSS, ABNORMAL 03/23/2008  . HEARING IMPAIRMENT 12/23/2007  . B12 deficiency 10/14/2007  . Malaise 10/14/2007  . INSOMNIA, PERSISTENT 07/31/2007  .  Anxiety state 03/01/2007  . DEPRESSION 03/01/2007  . Paroxysmal atrial fibrillation ([place]) 02/26/2007  . Vitamin D deficiency 02/19/2007  . GERD 02/19/2007  . COPD with chronic bronchitis ([place]) 08/07/2006    Past Surgical History:  Procedure Laterality Date  . BLADDER SURGERY     Bladder tack and intestines  . CORONARY/GRAFT ACUTE MI REVASCULARIZATION N/A 08/26/2019    Procedure: Coronary/Graft Acute MI Revascularization;  Surgeon: Sherren Mocha, MD;  Location: [facility];  Service: Cardiovascular;  Laterality: N/A;  . CYSTOCELE REPAIR    . CYSTOSCOPY WITH RETROGRADE PYELOGRAM, URETEROSCOPY AND STENT PLACEMENT Right 09/11/2016   Procedure: CYSTOSCOPY WITH RETROGRADE PYELOGRAM, URETEROSCOPY AND STENT REPLACEMENT;  Surgeon: Cleon Gustin, MD;  Location: [facility];  Service: Urology;  Laterality: Right;  . CYSTOSCOPY WITH STENT PLACEMENT Right 08/11/2016   Procedure: CYSTOSCOPY, URETERSCOPY,  RIGHT RETROGRADE WITH RIGHT STENT PLACEMENT;  Surgeon: Festus Aloe, MD;  Location: [facility];  Service: Urology;  Laterality: Right;  . SPLENECTOMY       OB History   No obstetric history on file.     Family History  Problem Relation Age of Onset  . Heart disease Father   . Arthritis Other        Family history of  . Coronary artery disease Other        Family history of  . Hypertension Other        Family history of  . Diabetes Daughter        18 died    Social History   Tobacco Use  . Smoking status: Former Smoker    Packs/day: 1.00    Types: Cigarettes    Quit date: 2010    Years since quitting: 11.5  . Smokeless tobacco: Never Used  . Tobacco comment: pt unsure how many years she smoked  Vaping Use  . Vaping Use: Never used  Substance Use Topics  . Alcohol use: No  . Drug use: No    Home Medications Prior to Admission medications   Medication Sig Start Date End Date Taking? Authorizing Provider  aspirin 81 MG chewable tablet Chew 1 tablet (81 mg total) by mouth daily. 08/29/19  Yes Kathyrn Drown D, NP  clopidogrel (PLAVIX) 75 MG tablet Take 1 tablet (75 mg total) by mouth daily with breakfast. 08/29/19  Yes Kathyrn Drown D, NP  metoprolol tartrate (LOPRESSOR) 25 MG tablet Take 1 tablet (25 mg total) by mouth 2 (two) times daily. 09/10/19  Yes Cleaver, Jossie Ng, NP  amLODipine (NORVASC) 5 MG tablet TAKE 1 TABLET(5 MG) BY MOUTH DAILY  02/05/19   Plotnikov, Evie Lacks, MD  Biotin 1000 MCG tablet Take 1,000 mcg by mouth daily.    [provider]  Cholecalciferol ([employer] VITAMIN D3) 1000 UNITS capsule Take 1,000 Units by mouth daily.      [provider]  clonazePAM (KLONOPIN) 0.5 MG tablet TAKE 1 TABLET BY MOUTH AT BEDTIME AS NEEDED FOR LEG CRAMPS Patient taking differently: Take 0.5 mg by mouth daily as needed (For leg cramps per patient).  02/05/19   Plotnikov, Evie Lacks, MD  cyanocobalamin 1000 MCG tablet Take 1,000 mcg by mouth daily.    [provider]  Fluticasone-Umeclidin-Vilant (TRELEGY ELLIPTA) 100-62.5-25 MCG/INH AEPB Inhale 1 Inhaler into the lungs daily. Patient taking differently: Inhale 1 Inhaler into the lungs daily as needed (For shortness of breath).  05/06/19   Plotnikov, Evie Lacks, MD  ketoconazole (NIZORAL) 2 % cream APPLY TOPICALLY TO THE AFFECTED AREA DAILY  Patient taking differently: Apply 1 application topically daily as needed for irritation.  08/11/19   Plotnikov, Evie Lacks, MD  magic mouthwash w/lidocaine SOLN Take 5 mLs by mouth 4 (four) times daily as needed for mouth pain. 07/10/19   Plotnikov, Evie Lacks, MD  nitroGLYCERIN (NITROSTAT) 0.4 MG SL tablet Place 1 tablet (0.4 mg total) under the tongue every 5 (five) minutes as needed for chest pain. 08/28/19 08/27/20  Kathyrn Drown D, NP  pantoprazole (PROTONIX) 40 MG tablet Take 1 tablet (40 mg total) by mouth daily. 02/05/19   Plotnikov, Evie Lacks, MD  rosuvastatin (CRESTOR) 20 MG tablet Take 1 tablet (20 mg total) by mouth daily. 08/29/19   Kathyrn Drown D, NP  tobramycin (TOBREX) 0.3 % ophthalmic solution Place 1 drop into both eyes every 6 (six) hours.  11/30/18   [provider]    Allergies    Amoxicillin, Codeine, Levaquin [levofloxacin in d5w], and Other  Review of Systems   Review of Systems  Constitutional: Positive for activity change.  Eyes: Positive for visual disturbance.  Respiratory: Positive for shortness of  breath.   Cardiovascular: Negative for chest pain.  Gastrointestinal: Negative for nausea and vomiting.  All other systems reviewed and are negative.   Physical Exam Updated Vital Signs BP (!) 191/85   Pulse 94   Temp 98.1 F (36.7 C) (Oral)   Resp 19   Ht 5\' 4"  (1.626 m)   Wt 56.7 kg   SpO2 94%   BMI 21.46 kg/m   Physical Exam Vitals and nursing note reviewed.  Constitutional:      Appearance: She is well-developed.  HENT:     Head: Normocephalic and atraumatic.  Cardiovascular:     Rate and Rhythm: Normal rate.  Pulmonary:     Effort: Pulmonary effort is normal.  Abdominal:     General: Bowel sounds are normal.  Musculoskeletal:     Cervical back: Normal range of motion and neck supple.     Right lower leg: Edema present.     Left lower leg: Edema present.  Skin:    General: Skin is warm and dry.  Neurological:     Mental Status: She is alert and oriented to person, place, and time.     Cranial Nerves: No cranial nerve deficit.     Sensory: No sensory deficit.     Motor: No weakness.     Coordination: Coordination normal.     Comments: Bedside finger counting is normal     ED Results / Procedures / Treatments   Labs (all labs ordered are listed, but only abnormal results are displayed) Labs Reviewed  CBC WITH DIFFERENTIAL/PLATELET - Abnormal; Notable for the following components:      Result Value   WBC 11.1 (*)    RDW 15.6 (*)    Monocytes Absolute 1.1 (*)    All other components within normal limits  COMPREHENSIVE METABOLIC PANEL - Abnormal; Notable for the following components:   Potassium 3.0 (*)    Creatinine, Ser 1.34 (*)    Calcium 8.7 (*)    Total Protein 6.2 (*)    Albumin 3.4 (*)    GFR calc non Af Amer 37 (*)    GFR calc Af Amer 43 (*)    All other components within normal limits  BRAIN NATRIURETIC PEPTIDE - Abnormal; Notable for the following components:   B Natriuretic Peptide 2,581.2 (*)    All other components within normal limits    TROPONIN I (HIGH  SENSITIVITY) - Abnormal; Notable for the following components:   Troponin I (High Sensitivity) 98 (*)    All other components within normal limits  TROPONIN I (HIGH SENSITIVITY) - Abnormal; Notable for the following components:   Troponin I (High Sensitivity) 135 (*)    All other components within normal limits  SARS CORONAVIRUS 2 BY RT PCR [facility] ORDER, [place] LAB)  PROTIME-INR  APTT  SEDIMENTATION RATE    EKG EKG Interpretation  Date/Time:  Monday September 15 2019 10:59:44 EDT Ventricular Rate:  91 PR Interval:    QRS Duration: 76 QT Interval:  372 QTC Calculation: 458 R Axis:   99 Text Interpretation: Sinus rhythm Short PR interval Right axis deviation Anteroseptal infarct, old Nonspecific T abnormalities, lateral leads No acute changes Nonspecific ST and T wave abnormality Confirmed by Varney Biles [phone]) on 09/15/2019 2:09:54 PM   Radiology DG Chest 2 View  Result Date: 09/15/2019 CLINICAL DATA:  Shortness of breath EXAM: CHEST - 2 VIEW COMPARISON:  09/12/2019 FINDINGS: Trace pleural effusions. Bibasilar atelectasis. Chronic interstitial prominence. No pneumothorax. Top normal heart size. No acute osseous abnormality. Diffuse calcification of the visualized aorta. IMPRESSION: Persistent trace pleural effusions and bibasilar atelectasis. Electronically Signed   By: Macy Mis M.D.   On: 09/15/2019 14:14   CT Head Wo Contrast  Result Date: 09/15/2019 CLINICAL DATA:  Focal neurological deficit, stroke suspected EXAM: CT HEAD WITHOUT CONTRAST TECHNIQUE: Contiguous axial images were obtained from the base of the skull through the vertex without intravenous contrast. COMPARISON:  08/13/2016 head CT. FINDINGS: Brain: No acute infarct or intracranial hemorrhage. No mass lesion. No midline shift or extra-axial fluid collection. Mild parenchymal volume loss with ex vacuo dilatation is unchanged. Vascular: No hyperdense vessel. Bilateral  carotid siphon and V4 segment atherosclerotic calcifications. Skull: Negative for fracture or focal lesion. Sinuses/Orbits: Normal orbits. Clear paranasal sinuses. No mastoid effusion. Other: None. IMPRESSION: No acute intracranial process. Mild cerebral atrophy, similar to prior exam and within normal limits for patient's age. Electronically Signed   By: Primitivo Gauze M.D.   On: 09/15/2019 14:09    Procedures .Critical Care Performed by: Varney Biles, MD Authorized by: Varney Biles, MD   Critical care provider statement:    Critical care time (minutes):  40   Critical care was necessary to treat or prevent imminent or life-threatening deterioration of the following conditions:  Circulatory failure   Critical care was time spent personally by me on the following activities:  Discussions with consultants, evaluation of patient's response to treatment, examination of patient, ordering and performing treatments and interventions, ordering and review of laboratory studies, ordering and review of radiographic studies, pulse oximetry, re-evaluation of patient's condition, obtaining history from patient or surrogate and review of old charts   (including critical care time)  Medications Ordered in ED Medications  0.9 %  sodium chloride infusion ( Intravenous New Bag/Given (Non-Interop) 09/15/19 1335)  amLODipine (NORVASC) tablet 5 mg (5 mg Oral Given 09/15/19 1343)  metoprolol tartrate (LOPRESSOR) tablet 25 mg (25 mg Oral Given 09/15/19 1625)  rosuvastatin (CRESTOR) tablet 20 mg (has no administration in time range)  nitroGLYCERIN 50 mg in dextrose 5 % 250 mL (0.2 mg/mL) infusion (has no administration in time range)  aspirin chewable tablet 324 mg (324 mg Oral Given 09/15/19 1313)  heparin injection 3,400 Units (3,400 Units Intravenous Given 09/15/19 1336)  furosemide (LASIX) injection 20 mg (20 mg Intravenous Given 09/15/19 1625)    ED Course  I have  reviewed the triage vital signs and  the nursing notes.  Pertinent labs & imaging results that were available during my care of the patient were reviewed by me and considered in my medical decision making (see chart for details).  Clinical Course as of Sep 14 1728  Mon Sep 15, 2019  1608 BNP is elevated.  On exam patient did have rales.  Clinically she had CHF due to pulmonary edema.  We will start Lasix.  Home BP medications given.  No significant improvement in blood pressure.  Brain natriuretic peptide(!) [AN]  [id number] I spoke with Dr. Radford Pax.  She has reviewed with [place] the processed history and the patient's presentation with elevated BNP and rising troponin.  She recommends that patient be admitted to [facility] long by hospitalist service and they will round on the patient tomorrow.  Dr. Regenia Skeeter will take on.  I will start nitroglycerin drip at this time.   [AN]  1729 The blurry vision that she had does not need acute work-up.  She reports that the symptoms have been present for 2 days.  CT scan is not showing any acute findings.  If her blurry vision persists despite optimization of blood pressure then MRI of the brain can be considered by the admitting service.   [AN]    Clinical Course User Index [AN] Varney Biles, MD   MDM Rules/Calculators/A&P                          84 year old female comes in a chief complaint of vision change, shortness of breath.  She is status post stent placement for MI.  Clinically she is having CHF with orthopnea, paroxysmal nocturnal dyspnea and exertional dyspnea.  On exam she has bibasilar rales.  Likely will need admission to the hospital.  She had preserved EF at the time of discharge and is not on Lasix.  Patient's blood pressure is elevated.  She has not taken her morning meds.  I will give her oral home meds first, and then reassess.  Patient is complaining of vision change through her right eye.  Bedside finger counting is normal.  Bedside neurologic exam is nonfocal besides the  vision complaints she has.  Her BP is high, but I doubt that there is hypertensive emergency from it unless there is occlusion of her carotid/vertebral blood vessels.  Final Clinical Impression(s) / ED Diagnoses Final diagnoses:  Acute systolic congestive heart failure [facility])    Rx / DC Orders ED Discharge Orders    None       Varney Biles, MD 09/15/19 [place], Arlen Legendre, MD 09/15/19 1730

## 2019-09-15 NOTE — ED Notes (Signed)
Date and time results received: 09/15/19 5:19 PM  (use smartphrase ".now" to insert current time)  Test: Troponin Critical Value: 135  Name of Provider Notified: Nanavati  Orders Received? Or Actions Taken?: Orders Received - See Orders for details

## 2019-09-15 NOTE — Telephone Encounter (Signed)
New Message:     Chasie from [facility] says she needs an updated list of patient's medicine please.

## 2019-09-15 NOTE — Telephone Encounter (Signed)
New message:   Pt's sister is calling and states the pt is needing some assistance. Pt is currently in the hospital but the sister states she has to do everything for the pt and she needs to be somewhere she can get some assistance. Please advise.

## 2019-09-15 NOTE — ED Notes (Signed)
Attempted to obtain repeat troponin unsuccessfully. RN notified. Pt also provided with graham crackers and peanut butter and water.

## 2019-09-15 NOTE — H&P (Signed)
[name] GQQ:[id number] DOB: 05-18-1935 DOA: 09/15/2019    PCP: Cassandria Anger, MD   Outpatient Specialists:   CARDS:  Dr. Caryn Section, MD   Patient arrived to ER on 09/15/19 at 1023 Referred by Attending Sherwood Gambler, MD   Patient coming from: home Lives alone,       Chief Complaint:   Chief Complaint  Patient presents with  . Hypertension    HPI: [name] is a 84 y.o. female with medical history significant of recent ST elevation MI, CKD stage 3, HTN,  atrial fibrillation , B12 deficiency, COPD, depression, GERD, peptic ulcers    Presented with elevated blood pressure she states her home blood pressure was 190s over 100 she was endorsing back pain and overall shortness of breath and her legs started to swell up and bruise over her body.  admitted to the hospital on 08/26/2019 for acute lateral STEMI and underwent PTCA of the first diagonal which was 100 % percent stenosed (reduced to 50%) no PCI was done due to the small caliber of the vessel Her creatinine on 08/27/2019 was 1.82.  Peak high-sensitivity troponins 3863 Her LDL 154.  Hemoglobin A1c 6.3.  While she was ambulating with cardiac rehab her oxygen saturation dropped to 87% therefore she was discharged on supplemental oxygen, 3 L nasal cannula.  Since her discharge her  metoprolol was   Increased to 25 mg twice daily  Patient was stating she did not want to use Plavix. Infectious risk factors:  Reports shortness of breath   Has    been vaccinated against COVID    Initial COVID TEST   in house  PCR testing  Pending  Lab Results  Component Value Date   [place] NEGATIVE 08/26/2019     Regarding pertinent Chronic problems:     Hyperlipidemia -  on statins Crestor Lipid Panel     Component Value Date/Time   CHOL 239 (H) 08/26/2019 1240   TRIG 155 (H) 08/26/2019 1240   HDL 54 08/26/2019 1240   CHOLHDL 4.4 08/26/2019 1240   VLDL 31 08/26/2019 1240   LDLCALC 154 (H)  08/26/2019 1240   LDLDIRECT 144.3 01/31/2011 0905     HTN on Norvasc metoprolol Last echogram during her admission in June was showing preserved EF Recently her ACE inhibitor ARB was held secondary to renal function   CAD  - On Aspirin, statin, betablocker, Plavix                 - followed by cardiology       COPD   not  on baseline oxygen       A. Fib -   CHA2DS2 vas score   6         Not on anticoagulation secondary to Risk of Falls,   recurrent bleeding only on aspirin        -  Rate control:  Currently controlled with Metoprolol    CKD stage III - baseline Cr 1.6 Estimated Creatinine Clearance: 28.4 mL/min (A) (by C-G formula based on SCr of 1.34 mg/dL (H)).  Lab Results  Component Value Date   CREATININE 1.34 (H) 09/15/2019   CREATININE 1.54 (H) 09/12/2019   CREATININE 1.67 (H) 08/28/2019     While in ER: Noted however cell count 11.1 potassium down to 3.0 elevated BNP evidence of CHF on chest x-ray troponins slightly elevated 98-135  Initiated nitro drip for hypertensive urgency/emergency  ER Provider Called: Cardiology  Dr. Radford Pax They Recommend admit to medicine   Will see in AM   Hospitalist was called for admission for hypertensive emergency  The following Work up has been ordered so far:  Orders Placed This Encounter  Procedures  . Critical Care  . SARS Coronavirus 2 by RT PCR (hospital order, performed in [facility] [place] hospital lab) Nasopharyngeal Nasopharyngeal Swab  . DG Chest 2 View  . CT Head Wo Contrast  . CBC with Differential/Platelet  . Protime-INR  . APTT  . Comprehensive metabolic panel  . Brain natriuretic peptide  . Sedimentation rate  . Diet Heart Room service appropriate? Yes; Fluid consistency: Thin  . Cardiac monitoring  . Pulse checks  . Document Actual / Estimated Weight  . Remove all clothing, place in gown  . Initiate Carrier Fluid Protocol  . Visual acuity screening  . Ambulate with assistance  . Inpatient consult to  Cardiology  ALL PATIENTS BEING ADMITTED/HAVING PROCEDURES NEED COVID-19 SCREENING  . Consult to hospitalist  ALL PATIENTS BEING ADMITTED/HAVING PROCEDURES NEED COVID-19 SCREENING  . Consult to hospitalist  ALL PATIENTS BEING ADMITTED/HAVING PROCEDURES NEED COVID-19 SCREENING  . Consult to hospitalist  ALL PATIENTS BEING ADMITTED/HAVING PROCEDURES NEED COVID-19 SCREENING  . Pulse oximetry, continuous  . ED EKG  . EKG 12-Lead  . Saline lock IV (Avoid right wrist)    Following Medications were ordered in ER: Medications  0.9 %  sodium chloride infusion ( Intravenous New Bag/Given (Non-Interop) 09/15/19 1335)  amLODipine (NORVASC) tablet 5 mg (5 mg Oral Given 09/15/19 1343)  metoprolol tartrate (LOPRESSOR) tablet 25 mg (25 mg Oral Given 09/15/19 1625)  rosuvastatin (CRESTOR) tablet 20 mg (20 mg Oral Given 09/15/19 1754)  nitroGLYCERIN 50 mg in dextrose 5 % 250 mL (0.2 mg/mL) infusion (50 mcg/min Intravenous New Bag/Given 09/15/19 1801)  aspirin chewable tablet 324 mg (324 mg Oral Given 09/15/19 1313)  heparin injection 3,400 Units (3,400 Units Intravenous Given 09/15/19 1336)  furosemide (LASIX) injection 20 mg (20 mg Intravenous Given 09/15/19 1625)        Consult Orders  (From admission, onward)         Start     Ordered   09/15/19 1806  Consult to hospitalist  ALL PATIENTS BEING ADMITTED/HAVING PROCEDURES NEED COVID-19 SCREENING  Once       Comments: ALL PATIENTS BEING ADMITTED/HAVING PROCEDURES NEED COVID-19 SCREENING  Provider:  (Not yet assigned)  Question Answer Comment  Place call to: [facility]   Reason for Consult Admit      09/15/19 1805   09/15/19 1727  Consult to hospitalist  ALL PATIENTS BEING ADMITTED/HAVING PROCEDURES NEED COVID-19 SCREENING  Once       Comments: ALL PATIENTS BEING ADMITTED/HAVING PROCEDURES NEED COVID-19 SCREENING  Provider:  (Not yet assigned)  Question Answer Comment  Place call to: [facility]   Reason for Consult Admit      09/15/19  1726   09/15/19 1600  Consult to hospitalist  ALL PATIENTS BEING ADMITTED/HAVING PROCEDURES NEED COVID-19 SCREENING  Once       Comments: ALL PATIENTS BEING ADMITTED/HAVING PROCEDURES NEED COVID-19 SCREENING  Provider:  (Not yet assigned)  Question Answer Comment  Place call to: [facility]   Reason for Consult Admit      09/15/19 1559          Significant initial  Findings: Abnormal Labs Reviewed  CBC WITH DIFFERENTIAL/PLATELET - Abnormal; Notable for the following components:      Result Value   WBC 11.1 (*)  RDW 15.6 (*)    Monocytes Absolute 1.1 (*)    All other components within normal limits  COMPREHENSIVE METABOLIC PANEL - Abnormal; Notable for the following components:   Potassium 3.0 (*)    Creatinine, Ser 1.34 (*)    Calcium 8.7 (*)    Total Protein 6.2 (*)    Albumin 3.4 (*)    GFR calc non Af Amer 37 (*)    GFR calc Af Amer 43 (*)    All other components within normal limits  BRAIN NATRIURETIC PEPTIDE - Abnormal; Notable for the following components:   B Natriuretic Peptide 2,581.2 (*)    All other components within normal limits  URINALYSIS, ROUTINE W REFLEX MICROSCOPIC - Abnormal; Notable for the following components:   Color, Urine STRAW (*)    Protein, ur 100 (*)    All other components within normal limits  TROPONIN I (HIGH SENSITIVITY) - Abnormal; Notable for the following components:   Troponin I (High Sensitivity) 98 (*)    All other components within normal limits  TROPONIN I (HIGH SENSITIVITY) - Abnormal; Notable for the following components:   Troponin I (High Sensitivity) 135 (*)    All other components within normal limits  TROPONIN I (HIGH SENSITIVITY) - Abnormal; Notable for the following components:   Troponin I (High Sensitivity) 137 (*)    All other components within normal limits    Otherwise labs showing:    Recent Labs  Lab 09/12/19 2012 09/15/19 1228  NA 140 143  K 3.5 3.0*  CO2 24 26  GLUCOSE 101* 90  BUN 18 15    CREATININE 1.54* 1.34*  CALCIUM 8.8* 8.7*    Cr stable,  Lab Results  Component Value Date   CREATININE 1.34 (H) 09/15/2019   CREATININE 1.54 (H) 09/12/2019   CREATININE 1.67 (H) 08/28/2019    Recent Labs  Lab 09/12/19 2012 09/15/19 1228  AST 27 33  ALT 15 17  ALKPHOS 50 51  BILITOT 1.0 0.8  PROT 6.6 6.2*  ALBUMIN 3.5 3.4*   Lab Results  Component Value Date   CALCIUM 8.7 (L) 09/15/2019     WBC      Component Value Date/Time   WBC 11.1 (H) 09/15/2019 1228   ANC    Component Value Date/Time   NEUTROABS 7.7 09/15/2019 1228   ALC No components found for: LYMPHAB    Plt: Lab Results  Component Value Date   PLT 347 09/15/2019     COVID-19 Labs  No results for input(s): DDIMER, FERRITIN, LDH, CRP in the last 72 hours.  Lab Results  Component Value Date   SARSCOV2NAA NEGATIVE 08/26/2019      HG/HCT  Stable,     Component Value Date/Time   HGB 13.7 09/15/2019 1228   HCT 41.8 09/15/2019 1228    No results for input(s): LIPASE, AMYLASE in the last 168 hours. No results for input(s): AMMONIA in the last 168 hours.     Troponin 98- 135   ECG: Ordered Personally reviewed by me showing: HR : 91 Rhythm:  NSR,    no evidence of ischemic changes QTC 458   BNP (last 3 results) Recent Labs    08/26/19 1240 09/15/19 1229  BNP 1,086.4* 2,581.2*     DM  labs:  HbA1C: Recent Labs    08/26/19 1240  HGBA1C 6.3*     CBG (last 3)  No results for input(s): GLUCAP in the last 72 hours.     UA  No  UTi   Urine analysis:    Component Value Date/Time   COLORURINE STRAW (A) 09/15/2019 2045   APPEARANCEUR CLEAR 09/15/2019 2045   LABSPEC 1.005 09/15/2019 2045   PHURINE 7.0 09/15/2019 2045   GLUCOSEU NEGATIVE 09/15/2019 2045   GLUCOSEU NEGATIVE 08/23/2016 1349   HGBUR NEGATIVE 09/15/2019 2045   BILIRUBINUR NEGATIVE 09/15/2019 2045   BILIRUBINUR negative 08/10/2016 [place] 09/15/2019 2045   PROTEINUR 100 (A) 09/15/2019 2045    UROBILINOGEN 0.2 09/29/2016 1155   NITRITE NEGATIVE 09/15/2019 2045   LEUKOCYTESUR NEGATIVE 09/15/2019 2045   Ordered  CT HEAD   NON acute  CXR - trace pleural effusions and bibasilar atelectasis.      ED Triage Vitals  Enc Vitals Group     BP 09/15/19 1106 (!) 184/92     Pulse Rate 09/15/19 1106 78     Resp 09/15/19 1106 (!) 22     Temp 09/15/19 1106 98.1 F (36.7 C)     Temp Source 09/15/19 1106 Oral     SpO2 09/15/19 1106 94 %     Weight 09/15/19 1110 125 lb (56.7 kg)     Height 09/15/19 1110 5\' 4"  (1.626 m)     Head Circumference --      Peak Flow --      Pain Score 09/15/19 1109 9     Pain Loc --      Pain Edu? --      Excl. in [place]? --   TMAX(24)@       Latest  Blood pressure (!) 147/80, pulse 85, temperature 98.1 F (36.7 C), temperature source Oral, resp. rate 16, height 5\' 4"  (1.626 m), weight 56.7 kg, SpO2 92 %.      Review of Systems:    Pertinent positives include:   shortness of breath at rest.   dyspnea on exertion,  Anasarca, Bilateral lower extremity swelling    Constitutional:  No weight loss, night sweats, Fevers, chills, fatigue, weight loss  HEENT:  No headaches, Difficulty swallowing,Tooth/dental problems,Sore throat,  No sneezing, itching, ear ache, nasal congestion, post nasal drip,  Cardio-vascular:  No chest pain, Orthopnea, PND, dizziness, palpitations.no GI:  No heartburn, indigestion, abdominal pain, nausea, vomiting, diarrhea, change in bowel habits, loss of appetite, melena, blood in stool, hematemesis Resp:  no No excess mucus, no productive cough, No non-productive cough, No coughing up of blood.No change in color of mucus.No wheezing. Skin:  no rash or lesions. No jaundice GU:  no dysuria, change in color of urine, no urgency or frequency. No straining to urinate.  No flank pain.  Musculoskeletal:  No joint pain or no joint swelling. No decreased range of motion. No back pain.  Psych:  No change in mood or affect. No  depression or anxiety. No memory loss.  Neuro: no localizing neurological complaints, no tingling, no weakness, no double vision, no gait abnormality, no slurred speech, no confusion  All systems reviewed and apart from [place] all are negative  Past Medical History:   Past Medical History:  Diagnosis Date  . Anxiety   . Arthritis   . Atrial fibrillation ([place])   . B12 deficiency   . COPD (chronic obstructive pulmonary disease) ([place])   . Depression   . GERD (gastroesophageal reflux disease)   . Hypertension   . Osteoarthrosis, hand    both hands  . Peptic ulcer, unspecified site, unspecified as acute or chronic, without mention of hemorrhage, perforation, or obstruction   . Pneumonia   .  PONV (postoperative nausea and vomiting)   . Vitamin D deficiency disease      Past Surgical History:  Procedure Laterality Date  . BLADDER SURGERY     Bladder tack and intestines  . CORONARY/GRAFT ACUTE MI REVASCULARIZATION N/A 08/26/2019   Procedure: Coronary/Graft Acute MI Revascularization;  Surgeon: Sherren Mocha, MD;  Location: [place] [facility];  Service: Cardiovascular;  Laterality: N/A;  . CYSTOCELE REPAIR    . CYSTOSCOPY WITH RETROGRADE PYELOGRAM, URETEROSCOPY AND STENT PLACEMENT Right 09/11/2016   Procedure: CYSTOSCOPY WITH RETROGRADE PYELOGRAM, URETEROSCOPY AND STENT REPLACEMENT;  Surgeon: Cleon Gustin, MD;  Location: [facility];  Service: Urology;  Laterality: Right;  . CYSTOSCOPY WITH STENT PLACEMENT Right 08/11/2016   Procedure: CYSTOSCOPY, URETERSCOPY,  RIGHT RETROGRADE WITH RIGHT STENT PLACEMENT;  Surgeon: Festus Aloe, MD;  Location: [facility];  Service: Urology;  Laterality: Right;  . SPLENECTOMY      Social History:  Ambulatory   Independently     reports that she quit smoking about 11 years ago. Her smoking use included cigarettes. She smoked 1.00 pack per day. She has never used smokeless tobacco. She reports that she does not drink alcohol and does not use drugs.    Family History:   Family History  Problem Relation Age of Onset  . Heart disease Father   . Arthritis Other        Family history of  . Coronary artery disease Other        Family history of  . Hypertension Other        Family history of  . Diabetes Daughter        82 died    Allergies: Allergies  Allergen Reactions  . Amoxicillin Other (See Comments)    "makes me crazy" Has patient had a PCN reaction causing immediate rash, facial/tongue/throat swelling, SOB or lightheadedness with hypotension: No Has patient had a PCN reaction causing severe rash involving mucus membranes or skin necrosis: No Has patient had a PCN reaction that required hospitalization: No Has patient had a PCN reaction occurring within the last 10 years: No If all of the above answers are "NO", then may proceed with Cephalosporin use.   . Codeine Nausea And Vomiting  . Levaquin [Levofloxacin In D5w] Nausea And Vomiting  . Other Nausea And Vomiting    Pt does not want to take any pain meds     Prior to Admission medications   Medication Sig Start Date End Date Taking? Authorizing Provider  aspirin 81 MG chewable tablet Chew 1 tablet (81 mg total) by mouth daily. 08/29/19  Yes Kathyrn Drown D, NP  clopidogrel (PLAVIX) 75 MG tablet Take 1 tablet (75 mg total) by mouth daily with breakfast. 08/29/19  Yes Kathyrn Drown D, NP  metoprolol tartrate (LOPRESSOR) 25 MG tablet Take 1 tablet (25 mg total) by mouth 2 (two) times daily. 09/10/19  Yes Cleaver, Jossie Ng, NP  amLODipine (NORVASC) 5 MG tablet TAKE 1 TABLET(5 MG) BY MOUTH DAILY 02/05/19   Plotnikov, Evie Lacks, MD  Biotin 1000 MCG tablet Take 1,000 mcg by mouth daily.    [provider]  Cholecalciferol ([employer] VITAMIN D3) 1000 UNITS capsule Take 1,000 Units by mouth daily.      [provider]  clonazePAM (KLONOPIN) 0.5 MG tablet TAKE 1 TABLET BY MOUTH AT BEDTIME AS NEEDED FOR LEG CRAMPS Patient taking differently: Take 0.5 mg by mouth daily as  needed (For leg cramps per patient).  02/05/19   Plotnikov, Evie Lacks,  MD  cyanocobalamin 1000 MCG tablet Take 1,000 mcg by mouth daily.    [provider]  Fluticasone-Umeclidin-Vilant (TRELEGY ELLIPTA) 100-62.5-25 MCG/INH AEPB Inhale 1 Inhaler into the lungs daily. Patient taking differently: Inhale 1 Inhaler into the lungs daily as needed (For shortness of breath).  05/06/19   Plotnikov, Evie Lacks, MD  ketoconazole (NIZORAL) 2 % cream APPLY TOPICALLY TO THE AFFECTED AREA DAILY Patient taking differently: Apply 1 application topically daily as needed for irritation.  08/11/19   Plotnikov, Evie Lacks, MD  magic mouthwash w/lidocaine SOLN Take 5 mLs by mouth 4 (four) times daily as needed for mouth pain. 07/10/19   Plotnikov, Evie Lacks, MD  nitroGLYCERIN (NITROSTAT) 0.4 MG SL tablet Place 1 tablet (0.4 mg total) under the tongue every 5 (five) minutes as needed for chest pain. 08/28/19 08/27/20  Kathyrn Drown D, NP  pantoprazole (PROTONIX) 40 MG tablet Take 1 tablet (40 mg total) by mouth daily. 02/05/19   Plotnikov, Evie Lacks, MD  rosuvastatin (CRESTOR) 20 MG tablet Take 1 tablet (20 mg total) by mouth daily. 08/29/19   Kathyrn Drown D, NP  tobramycin (TOBREX) 0.3 % ophthalmic solution Place 1 drop into both eyes every 6 (six) hours.  11/30/18   [provider]   Physical Exam: Vitals with BMI 09/15/2019 09/15/2019 09/15/2019  Height - - -  Weight - - -  BMI - - -  Systolic 595 638 756  Diastolic 80 85 85  Pulse 85 78 94    1. General:  in No  Acute distress    Chronically ill  -appearing 2. Psychological: Alert and   Oriented 3. Head/ENT:   Moist Mucous Membranes                          Head Non traumatic, neck supple                          Poor Dentition 4. SKIN: decreased Skin turgor,  Skin clean Dry and intact no rash 5. Heart: Regular rate and rhythm no Murmur, no Rub or gallop 6. Lungs: no wheezes or crackles   7. Abdomen: Soft,  non-tender, Non distended  bowel sounds  present 8. Lower extremities: no clubbing, cyanosis,  2+  edema 9. Neurologically Grossly intact, moving all 4 extremities equally   10. MSK: Normal range of motion   All other LABS:     Recent Labs  Lab 09/12/19 2012 09/15/19 1228  WBC 10.3 11.1*  NEUTROABS 5.9 7.7  HGB 13.5 13.7  HCT 42.9 41.8  MCV 94.5 94.6  PLT 410* 347     Recent Labs  Lab 09/12/19 2012 09/15/19 1228  NA 140 143  K 3.5 3.0*  CL 105 106  CO2 24 26  GLUCOSE 101* 90  BUN 18 15  CREATININE 1.54* 1.34*  CALCIUM 8.8* 8.7*     Recent Labs  Lab 09/12/19 2012 09/15/19 1228  AST 27 33  ALT 15 17  ALKPHOS 50 51  BILITOT 1.0 0.8  PROT 6.6 6.2*  ALBUMIN 3.5 3.4*       Cultures:    Component Value Date/Time   SDES URINE, CLEAN CATCH 09/29/2016 1206   SPECREQUEST NONE 09/29/2016 1206   CULT MULTIPLE SPECIES PRESENT, SUGGEST RECOLLECTION (A) 09/29/2016 1206   REPTSTATUS 09/30/2016 FINAL 09/29/2016 1206     Radiological Exams on Admission: DG Chest 2 View  Result Date: 09/15/2019 CLINICAL DATA:  Shortness of breath EXAM: CHEST - 2 VIEW COMPARISON:  09/12/2019 FINDINGS: Trace pleural effusions. Bibasilar atelectasis. Chronic interstitial prominence. No pneumothorax. Top normal heart size. No acute osseous abnormality. Diffuse calcification of the visualized aorta. IMPRESSION: Persistent trace pleural effusions and bibasilar atelectasis. Electronically Signed   By: Macy Mis M.D.   On: 09/15/2019 14:14   CT Head Wo Contrast  Result Date: 09/15/2019 CLINICAL DATA:  Focal neurological deficit, stroke suspected EXAM: CT HEAD WITHOUT CONTRAST TECHNIQUE: Contiguous axial images were obtained from the base of the skull through the vertex without intravenous contrast. COMPARISON:  08/13/2016 head CT. FINDINGS: Brain: No acute infarct or intracranial hemorrhage. No mass lesion. No midline shift or extra-axial fluid collection. Mild parenchymal volume loss with ex vacuo dilatation is unchanged. Vascular:  No hyperdense vessel. Bilateral carotid siphon and V4 segment atherosclerotic calcifications. Skull: Negative for fracture or focal lesion. Sinuses/Orbits: Normal orbits. Clear paranasal sinuses. No mastoid effusion. Other: None. IMPRESSION: No acute intracranial process. Mild cerebral atrophy, similar to prior exam and within normal limits for patient's age. Electronically Signed   By: Primitivo Gauze M.D.   On: 09/15/2019 14:09    Chart has been reviewed   Assessment/Plan  84 y.o. female with medical history significant of recent ST elevation MI, CKD stage 3, HTN,  atrial fibrillation , B12 deficiency, COPD, depression, GERD, peptic ulcers     Admitted for hypertensive emergency  Present on Admission: . Hypertensive emergency -currently on nitro drip with good control.  Admit to stepdown transition to home medications when able Evidence of elevated troponin continue to cycle   Dyspnea -seems to do worse with laying down flat.  Chest x-ray showing trace pleural effusion recent echogram did not show evidence of CHF Could be multifactorial patient does have history of COPD.  No evidence of hypoxia at this time.  For completion we will add a D-dimer as patient not anticoagulated  CAD - continue plavix, beta blocker  appreciate cardiology consult  . COPD with chronic bronchitis (HCC) chronic stable continue to monitor Dyspnea could be multifactorial including pulmonary etiology.  . Generalized anxiety disorder -chronic stable continue home medications  Pulmonary edema in the setting of hypertensive urgency.  Treat underlying etiology Diuresis as needed last echogram was on 23 June defer to cardiology if needs repeat  . GERD -chronic stable continue home meds  . Hyperlipidemia with target LDL less than 70  -chronic stable continue home meds  . Paroxysmal atrial fibrillation (HCC) continue aspirin and beta-blocker Not anticoagulated  . Peptic ulcer -continue PPI  . Hypokalemia  -replete and check magnesium level  . CKD (chronic kidney disease), stage III chronic avoid nephrotoxic medications  . Elevated troponin -likely secondary to demand we will continue to cycle and monitor trend appreciate cardiology input    Other plan as per orders.  DVT prophylaxis:  SCD     Code Status:    Code Status: Prior FULL CODE  care as per patient   I had personally discussed CODE STATUS with patient      Family Communication:   Family not at  Bedside    Disposition Plan:        To home once workup is complete and patient is stable   Following barriers for discharge:                            Electrolytes corrected  Will likely need home health, home O2, set up                           Will need consultants to evaluate patient prior to discharge                      Would benefit from PT/OT eval prior to DC  Ordered                                        Consults called: [email or web address] was Made aware by ER, will see in AM     Admission status:  ED Disposition    ED Disposition Condition [place]: [place] [100102] Level of Care: Stepdown [14] Admit to SDU based on following criteria: Hemodynamic compromise or significant risk of instability:  Patient requiring short term acute titration and management of vas oactive drips, and invasive monitoring (i.e., CVP and Arterial line). Covid Evaluation: COVID negative Diagnosis: Hypertensive emergency [211941] Admitting Physician: Toy Baker [[id number]] Attending Physician: Toy Baker [[id number]]        Obs     Level of care      SDU tele indefinitely please discontinue once patient no longer qualifies COVID-19 Labs    Lab Results  Component Value Date   [place] 08/26/2019     Precautions: admitted as  asymptomatic screening protocol    PPE: Used by the provider:   N95   eye Goggles,    Gloves     Shayanna Thatch 09/15/2019, 11:28 PM    [facility]     after 2 AM please page floor coverage PA If 7AM-7PM, please contact the day team taking care of the patient using [email or web address]   Patient was evaluated in the context of the global COVID-19 pandemic, which necessitated consideration that the patient might be at risk for infection with the SARS-CoV-2 virus that causes COVID-19. Institutional protocols and algorithms that pertain to the evaluation of patients at risk for COVID-19 are in a state of rapid change based on information released by regulatory bodies including the CDC and federal and state organizations. These policies and algorithms were followed during the patient's care.

## 2019-09-15 NOTE — ED Provider Notes (Signed)
I discussed case with Dr. Roel Cluck, who accepts for admission. She asks for continued trend of troponins given rise on first 2.   Sherwood Gambler, MD 09/15/19 [id number]

## 2019-09-15 NOTE — Telephone Encounter (Signed)
Spoke to [place] [facility], states they need upadated med list on pt

## 2019-09-15 NOTE — Telephone Encounter (Signed)
New message:   [name] is calling from [facility] to get some clarification on the pt's medication list. Please advise.

## 2019-09-16 ENCOUNTER — Inpatient Hospital Stay (HOSPITAL_COMMUNITY): Payer: Medicare Other

## 2019-09-16 ENCOUNTER — Ambulatory Visit: Payer: Medicare Other | Admitting: Internal Medicine

## 2019-09-16 DIAGNOSIS — I130 Hypertensive heart and chronic kidney disease with heart failure and stage 1 through stage 4 chronic kidney disease, or unspecified chronic kidney disease: Secondary | ICD-10-CM | POA: Diagnosis present

## 2019-09-16 DIAGNOSIS — I480 Paroxysmal atrial fibrillation: Secondary | ICD-10-CM | POA: Diagnosis present

## 2019-09-16 DIAGNOSIS — I5033 Acute on chronic diastolic (congestive) heart failure: Secondary | ICD-10-CM | POA: Diagnosis not present

## 2019-09-16 DIAGNOSIS — K219 Gastro-esophageal reflux disease without esophagitis: Secondary | ICD-10-CM | POA: Diagnosis present

## 2019-09-16 DIAGNOSIS — Z20822 Contact with and (suspected) exposure to covid-19: Secondary | ICD-10-CM | POA: Diagnosis present

## 2019-09-16 DIAGNOSIS — M19041 Primary osteoarthritis, right hand: Secondary | ICD-10-CM | POA: Diagnosis present

## 2019-09-16 DIAGNOSIS — E785 Hyperlipidemia, unspecified: Secondary | ICD-10-CM | POA: Diagnosis present

## 2019-09-16 DIAGNOSIS — I5021 Acute systolic (congestive) heart failure: Secondary | ICD-10-CM | POA: Diagnosis not present

## 2019-09-16 DIAGNOSIS — M7989 Other specified soft tissue disorders: Secondary | ICD-10-CM | POA: Diagnosis not present

## 2019-09-16 DIAGNOSIS — I161 Hypertensive emergency: Secondary | ICD-10-CM | POA: Diagnosis not present

## 2019-09-16 DIAGNOSIS — F411 Generalized anxiety disorder: Secondary | ICD-10-CM | POA: Diagnosis present

## 2019-09-16 DIAGNOSIS — I248 Other forms of acute ischemic heart disease: Secondary | ICD-10-CM | POA: Diagnosis present

## 2019-09-16 DIAGNOSIS — M79609 Pain in unspecified limb: Secondary | ICD-10-CM | POA: Diagnosis not present

## 2019-09-16 DIAGNOSIS — I5031 Acute diastolic (congestive) heart failure: Secondary | ICD-10-CM

## 2019-09-16 DIAGNOSIS — N1832 Chronic kidney disease, stage 3b: Secondary | ICD-10-CM | POA: Diagnosis not present

## 2019-09-16 DIAGNOSIS — J9621 Acute and chronic respiratory failure with hypoxia: Secondary | ICD-10-CM | POA: Diagnosis present

## 2019-09-16 DIAGNOSIS — E876 Hypokalemia: Secondary | ICD-10-CM | POA: Diagnosis present

## 2019-09-16 DIAGNOSIS — F329 Major depressive disorder, single episode, unspecified: Secondary | ICD-10-CM | POA: Diagnosis present

## 2019-09-16 DIAGNOSIS — M19042 Primary osteoarthritis, left hand: Secondary | ICD-10-CM | POA: Diagnosis present

## 2019-09-16 DIAGNOSIS — N1831 Chronic kidney disease, stage 3a: Secondary | ICD-10-CM | POA: Diagnosis not present

## 2019-09-16 DIAGNOSIS — J810 Acute pulmonary edema: Secondary | ICD-10-CM | POA: Diagnosis present

## 2019-09-16 DIAGNOSIS — J449 Chronic obstructive pulmonary disease, unspecified: Secondary | ICD-10-CM | POA: Diagnosis not present

## 2019-09-16 DIAGNOSIS — I252 Old myocardial infarction: Secondary | ICD-10-CM | POA: Diagnosis not present

## 2019-09-16 DIAGNOSIS — N179 Acute kidney failure, unspecified: Secondary | ICD-10-CM | POA: Diagnosis not present

## 2019-09-16 DIAGNOSIS — K279 Peptic ulcer, site unspecified, unspecified as acute or chronic, without hemorrhage or perforation: Secondary | ICD-10-CM | POA: Diagnosis present

## 2019-09-16 DIAGNOSIS — I2510 Atherosclerotic heart disease of native coronary artery without angina pectoris: Secondary | ICD-10-CM | POA: Diagnosis not present

## 2019-09-16 DIAGNOSIS — Z8249 Family history of ischemic heart disease and other diseases of the circulatory system: Secondary | ICD-10-CM | POA: Diagnosis not present

## 2019-09-16 DIAGNOSIS — M1990 Unspecified osteoarthritis, unspecified site: Secondary | ICD-10-CM | POA: Diagnosis present

## 2019-09-16 DIAGNOSIS — R778 Other specified abnormalities of plasma proteins: Secondary | ICD-10-CM | POA: Diagnosis not present

## 2019-09-16 DIAGNOSIS — Z9861 Coronary angioplasty status: Secondary | ICD-10-CM | POA: Diagnosis not present

## 2019-09-16 LAB — CBC WITH DIFFERENTIAL/PLATELET
Abs Immature Granulocytes: 0.07 10*3/uL (ref 0.00–0.07)
Basophils Absolute: 0.1 10*3/uL (ref 0.0–0.1)
Basophils Relative: 1 %
Eosinophils Absolute: 0.1 10*3/uL (ref 0.0–0.5)
Eosinophils Relative: 1 %
HCT: 37.5 % (ref 36.0–46.0)
Hemoglobin: 12 g/dL (ref 12.0–15.0)
Immature Granulocytes: 1 %
Lymphocytes Relative: 20 %
Lymphs Abs: 2.8 10*3/uL (ref 0.7–4.0)
MCH: 30.5 pg (ref 26.0–34.0)
MCHC: 32 g/dL (ref 30.0–36.0)
MCV: 95.2 fL (ref 80.0–100.0)
Monocytes Absolute: 1.6 10*3/uL — ABNORMAL HIGH (ref 0.1–1.0)
Monocytes Relative: 12 %
Neutro Abs: 9 10*3/uL — ABNORMAL HIGH (ref 1.7–7.7)
Neutrophils Relative %: 65 %
Platelets: 289 10*3/uL (ref 150–400)
RBC: 3.94 MIL/uL (ref 3.87–5.11)
RDW: 15.5 % (ref 11.5–15.5)
WBC: 13.7 10*3/uL — ABNORMAL HIGH (ref 4.0–10.5)
nRBC: 0 % (ref 0.0–0.2)

## 2019-09-16 LAB — COMPREHENSIVE METABOLIC PANEL
ALT: 18 U/L (ref 0–44)
AST: 29 U/L (ref 15–41)
Albumin: 3.2 g/dL — ABNORMAL LOW (ref 3.5–5.0)
Alkaline Phosphatase: 46 U/L (ref 38–126)
Anion gap: 13 (ref 5–15)
BUN: 17 mg/dL (ref 8–23)
CO2: 25 mmol/L (ref 22–32)
Calcium: 8.6 mg/dL — ABNORMAL LOW (ref 8.9–10.3)
Chloride: 100 mmol/L (ref 98–111)
Creatinine, Ser: 1.57 mg/dL — ABNORMAL HIGH (ref 0.44–1.00)
GFR calc Af Amer: 35 mL/min — ABNORMAL LOW (ref >60)
GFR calc non Af Amer: 31 mL/min — ABNORMAL LOW (ref >60)
Glucose, Bld: 88 mg/dL (ref 70–99)
Potassium: 3.8 mmol/L (ref 3.5–5.1)
Sodium: 138 mmol/L (ref 135–145)
Total Bilirubin: 0.6 mg/dL (ref 0.3–1.2)
Total Protein: 5.7 g/dL — ABNORMAL LOW (ref 6.5–8.1)

## 2019-09-16 LAB — PHOSPHORUS: Phosphorus: 3.2 mg/dL (ref 2.5–4.6)

## 2019-09-16 LAB — SARS CORONAVIRUS 2 BY RT PCR (HOSPITAL ORDER, PERFORMED IN ~~LOC~~ HOSPITAL LAB): SARS Coronavirus 2: NEGATIVE

## 2019-09-16 LAB — MAGNESIUM: Magnesium: 1.7 mg/dL (ref 1.7–2.4)

## 2019-09-16 LAB — TSH: TSH: 3.83 u[IU]/mL (ref 0.350–4.500)

## 2019-09-16 LAB — ECHOCARDIOGRAM COMPLETE
Height: 64 in
Weight: 2010.6 oz

## 2019-09-16 LAB — TROPONIN I (HIGH SENSITIVITY): Troponin I (High Sensitivity): 97 ng/L — ABNORMAL HIGH (ref <18)

## 2019-09-16 LAB — MRSA PCR SCREENING: MRSA by PCR: NEGATIVE

## 2019-09-16 LAB — D-DIMER, QUANTITATIVE: D-Dimer, Quant: 1.06 ug/mL-FEU — ABNORMAL HIGH (ref 0.00–0.50)

## 2019-09-16 MED ORDER — SODIUM CHLORIDE 0.9% FLUSH: 3.0000 mL | INTRAVENOUS | Status: DC | PRN

## 2019-09-16 MED ORDER — SODIUM CHLORIDE 0.9% FLUSH
3.0000 mL | Freq: Two times a day (BID) | INTRAVENOUS | Status: DC
  Administered 2019-09-16 – 2019-09-18 (×3): 3 mL via INTRAVENOUS

## 2019-09-16 MED ORDER — ACETAMINOPHEN 650 MG RE SUPP: 650.0000 mg | Freq: Four times a day (QID) | RECTAL | Status: DC | PRN

## 2019-09-16 MED ORDER — AMLODIPINE BESYLATE 5 MG PO TABS
5.0000 mg | ORAL_TABLET | Freq: Once | ORAL | Status: AC
  Administered 2019-09-16: 5 mg via ORAL
  Filled 2019-09-16: qty 1

## 2019-09-16 MED ORDER — HYDROCODONE-ACETAMINOPHEN 5-325 MG PO TABS
1.0000 | ORAL_TABLET | ORAL | Status: DC | PRN
  Administered 2019-09-16 – 2019-09-17 (×3): 1 via ORAL
  Filled 2019-09-16 (×3): qty 1

## 2019-09-16 MED ORDER — ACETAMINOPHEN 325 MG PO TABS
650.0000 mg | ORAL_TABLET | Freq: Four times a day (QID) | ORAL | Status: DC | PRN
  Administered 2019-09-18: 650 mg via ORAL
  Filled 2019-09-16: qty 2

## 2019-09-16 MED ORDER — CLOPIDOGREL BISULFATE 75 MG PO TABS
75.0000 mg | ORAL_TABLET | Freq: Every day | ORAL | Status: DC
  Administered 2019-09-16 – 2019-09-19 (×4): 75 mg via ORAL
  Filled 2019-09-16 (×5): qty 1

## 2019-09-16 MED ORDER — ORAL CARE MOUTH RINSE
15.0000 mL | Freq: Two times a day (BID) | OROMUCOSAL | Status: DC
  Administered 2019-09-16 – 2019-09-19 (×6): 15 mL via OROMUCOSAL

## 2019-09-16 MED ORDER — ONDANSETRON HCL 4 MG PO TABS
4.0000 mg | ORAL_TABLET | Freq: Four times a day (QID) | ORAL | Status: DC | PRN
  Administered 2019-09-16 – 2019-09-18 (×2): 4 mg via ORAL
  Filled 2019-09-16 (×2): qty 1

## 2019-09-16 MED ORDER — CHLORHEXIDINE GLUCONATE CLOTH 2 % EX PADS
6.0000 | MEDICATED_PAD | Freq: Every day | CUTANEOUS | Status: DC
  Administered 2019-09-16 – 2019-09-19 (×4): 6 via TOPICAL

## 2019-09-16 MED ORDER — SODIUM CHLORIDE 0.9 % IV SOLN: 250.0000 mL | INTRAVENOUS | Status: DC | PRN

## 2019-09-16 MED ORDER — ONDANSETRON HCL 4 MG/2ML IJ SOLN
4.0000 mg | Freq: Four times a day (QID) | INTRAMUSCULAR | Status: DC | PRN
  Administered 2019-09-16 – 2019-09-17 (×3): 4 mg via INTRAVENOUS
  Filled 2019-09-16 (×6): qty 2

## 2019-09-16 MED ORDER — FUROSEMIDE 10 MG/ML IJ SOLN
40.0000 mg | Freq: Two times a day (BID) | INTRAMUSCULAR | Status: DC
  Administered 2019-09-16 (×2): 40 mg via INTRAVENOUS
  Filled 2019-09-16 (×2): qty 4

## 2019-09-16 MED ORDER — DOCUSATE SODIUM 100 MG PO CAPS
100.0000 mg | ORAL_CAPSULE | Freq: Two times a day (BID) | ORAL | Status: DC
  Administered 2019-09-17 – 2019-09-19 (×4): 100 mg via ORAL
  Filled 2019-09-16 (×5): qty 1

## 2019-09-16 MED ORDER — AMLODIPINE BESYLATE 10 MG PO TABS
10.0000 mg | ORAL_TABLET | Freq: Every day | ORAL | Status: DC
  Administered 2019-09-17 – 2019-09-19 (×3): 10 mg via ORAL
  Filled 2019-09-16 (×3): qty 1

## 2019-09-16 MED ORDER — ASPIRIN 81 MG PO CHEW
81.0000 mg | CHEWABLE_TABLET | Freq: Every day | ORAL | Status: DC
  Administered 2019-09-16 – 2019-09-19 (×4): 81 mg via ORAL
  Filled 2019-09-16 (×4): qty 1

## 2019-09-16 MED ORDER — TRAMADOL HCL 50 MG PO TABS
25.0000 mg | ORAL_TABLET | Freq: Two times a day (BID) | ORAL | Status: DC | PRN
  Administered 2019-09-16 – 2019-09-17 (×2): 25 mg via ORAL
  Filled 2019-09-16 (×2): qty 1

## 2019-09-16 MED ORDER — PANTOPRAZOLE SODIUM 40 MG PO TBEC
40.0000 mg | DELAYED_RELEASE_TABLET | Freq: Every day | ORAL | Status: DC
  Administered 2019-09-16 – 2019-09-19 (×4): 40 mg via ORAL
  Filled 2019-09-16 (×4): qty 1

## 2019-09-16 NOTE — Progress Notes (Signed)
Right lower extremity venous duplex completed. Refer to "CV Proc" under chart review to view preliminary results.  09/16/2019 3:29 PM Kelby Aline., MHA, RVT, RDCS, RDMS

## 2019-09-16 NOTE — Progress Notes (Signed)
  Echocardiogram 2D Echocardiogram has been performed.  Randa Lynn Lejla Moeser 09/16/2019, 4:38 PM

## 2019-09-16 NOTE — Telephone Encounter (Signed)
Left pt a vm to call back, will try again later

## 2019-09-16 NOTE — Consult Note (Signed)
Cardiology Consultation:   Patient ID: [name] MRN: [id number]; DOB: [birth year]  Admit date: 09/15/2019 Date of Consult: 09/16/2019  Primary Care Provider: Cassandria Anger, Coffey [facility] Cardiologist: Stephanie Mocha, Coffey  [facility] Electrophysiologist:  None    Patient Profile:   [name] is a 84 y.o. female with a hx of STEMI s/p PTCA of D1, CKD stage III, emphysema/COPD, PAF (patient refuses anticoagulation), and syncope who is being seen today for the evaluation of elevated troponin and shortness of breath at the request of [name].  History of Present Illness:   [name] is not anticoagulated for PAF due to patient refusal. She was admitted with acute lateral STEMI 08/26/19 treated with balloon angioplasty alone due to small vessel size and poor distal runoff. She had calcified coronary arteries and no signficant disease in the left main, LCx, LAD, or RCA.  Unclear if this event was a plaque rupture or embolic event.  Echo with normal EF and no significant valvular disease. BNP was elevated, but she did not require diuretics at discharge.  She was seen in follow up on 09/14/19 and stated she did not want to take plavix.   She presented to [facility] with elevated BP. On arrival, was 191/85. She was also volume up on exam. She was admitted and cardiology consulted.   BNP 2581 HS troponin 98 --> 135 --> 137 --> 97 CXR with trace pleural effusions and bibasilar atelectasis sCr 1.57 Coffey-dimer 1.06 WBC 13.7  During my interview, she reports she has been short of breath with DOE, and describes orthopnea and lower extremity swelling since her cath. She states she has COPD but it has never bothered before her heart cath.  She denies chest pain. She reportedly did have chest pain and nausea at the time of her STEMI. She also complains of right forearm pain since her heart cath.   Past Medical History:  Diagnosis Date  . Anxiety   . Arthritis   . Atrial fibrillation  ([place])   . B12 deficiency   . COPD (chronic obstructive pulmonary disease) ([place])   . Depression   . GERD (gastroesophageal reflux disease)   . Hypertension   . Osteoarthrosis, hand    both hands  . Peptic ulcer, unspecified site, unspecified as acute or chronic, without mention of hemorrhage, perforation, or obstruction   . Pneumonia   . PONV (postoperative nausea and vomiting)   . Vitamin Coffey deficiency disease     Past Surgical History:  Procedure Laterality Date  . BLADDER SURGERY     Bladder tack and intestines  . CORONARY/GRAFT ACUTE MI REVASCULARIZATION N/A 08/26/2019   Procedure: Coronary/Graft Acute MI Revascularization;  Surgeon: Stephanie Mocha, Coffey;  Location: [place] CV LAB;  Service: Cardiovascular;  Laterality: N/A;  . CYSTOCELE REPAIR    . CYSTOSCOPY WITH RETROGRADE PYELOGRAM, URETEROSCOPY AND STENT PLACEMENT Right 09/11/2016   Procedure: CYSTOSCOPY WITH RETROGRADE PYELOGRAM, URETEROSCOPY AND STENT REPLACEMENT;  Surgeon: Stephanie Gustin, Coffey;  Location: [facility] OR[facility];  Service: Urology;  Laterality: Right;  . CYSTOSCOPY WITH STENT PLACEMENT Right 08/11/2016   Procedure: CYSTOSCOPY, URETERSCOPY,  RIGHT RETROGRADE WITH RIGHT STENT PLACEMENT;  Surgeon: Stephanie Aloe, Coffey;  Location: [facility] ORS;  Service: Urology;  Laterality: Right;  . SPLENECTOMY       Home Medications:  Prior to Admission medications   Medication Sig Start Date End Date Taking? Authorizing Provider  aspirin 81 MG chewable tablet Chew 1 tablet (81 mg total) by mouth daily.  08/29/19  Yes Stephanie Drown Coffey, Coffey  clopidogrel (PLAVIX) 75 MG tablet Take 1 tablet (75 mg total) by mouth daily with breakfast. 08/29/19  Yes Stephanie Drown Coffey, Coffey  metoprolol tartrate (LOPRESSOR) 25 MG tablet Take 1 tablet (25 mg total) by mouth 2 (two) times daily. 09/10/19  Yes [name]  amLODipine (NORVASC) 5 MG tablet TAKE 1 TABLET(5 MG) BY MOUTH DAILY 02/05/19   [name]  Biotin 1000 MCG tablet Take 1,000 mcg by  mouth daily.    Provider, Historical, Coffey  Cholecalciferol (CVS VITAMIN D3) 1000 UNITS capsule Take 1,000 Units by mouth daily.      Provider, Historical, Coffey  clonazePAM (KLONOPIN) 0.5 MG tablet TAKE 1 TABLET BY MOUTH AT BEDTIME AS NEEDED FOR LEG CRAMPS Patient taking differently: Take 0.5 mg by mouth daily as needed (For leg cramps per patient).  02/05/19   [name]  cyanocobalamin 1000 MCG tablet Take 1,000 mcg by mouth daily.    Provider, Historical, Coffey  Fluticasone-Umeclidin-Vilant (TRELEGY ELLIPTA) 100-62.5-25 MCG/INH AEPB Inhale 1 Inhaler into the lungs daily. Patient taking differently: Inhale 1 Inhaler into the lungs daily as needed (For shortness of breath).  05/06/19   [name]  ketoconazole (NIZORAL) 2 % cream APPLY TOPICALLY TO THE AFFECTED AREA DAILY Patient taking differently: Apply 1 application topically daily as needed for irritation.  08/11/19   [name]  magic mouthwash w/lidocaine SOLN Take 5 mLs by mouth 4 (four) times daily as needed for mouth pain. 07/10/19   [name]  nitroGLYCERIN (NITROSTAT) 0.4 MG SL tablet Place 1 tablet (0.4 mg total) under the tongue every 5 (five) minutes as needed for chest pain. 08/28/19 08/27/20  Stephanie Drown Coffey, Coffey  pantoprazole (PROTONIX) 40 MG tablet Take 1 tablet (40 mg total) by mouth daily. 02/05/19   [name]  rosuvastatin (CRESTOR) 20 MG tablet Take 1 tablet (20 mg total) by mouth daily. 08/29/19   Stephanie Drown Coffey, Coffey  tobramycin (TOBREX) 0.3 % ophthalmic solution Place 1 drop into both eyes every 6 (six) hours.  11/30/18   Provider, Historical, Coffey    Inpatient Medications: Scheduled Meds: . amLODipine  5 mg Oral Daily  . aspirin  81 mg Oral Daily  . Chlorhexidine Gluconate Cloth  6 each Topical Daily  . clopidogrel  75 mg Oral Q breakfast  . docusate sodium  100 mg Oral BID  . furosemide  40 mg Intravenous Q12H  . mouth rinse  15 mL Mouth Rinse BID  . metoprolol  tartrate  25 mg Oral BID  . nystatin   Topical TID  . pantoprazole  40 mg Oral Daily  . rosuvastatin  20 mg Oral Daily  . sodium chloride flush  3 mL Intravenous Q12H  . sodium chloride flush  3 mL Intravenous Q12H   Continuous Infusions: . sodium chloride 15 mL/hr at 09/15/19 1335  . sodium chloride    . nitroGLYCERIN 30 mcg/min (09/16/19 1313)   PRN Meds: sodium chloride, acetaminophen **OR** acetaminophen, HYDROcodone-acetaminophen, ondansetron **OR** ondansetron (ZOFRAN) IV, sodium chloride flush, traMADol  Allergies:    Allergies  Allergen Reactions  . Amoxicillin Other (See Comments)    "makes me crazy" Has patient had a PCN reaction causing immediate rash, facial/tongue/throat swelling, SOB or lightheadedness with hypotension: No Has patient had a PCN reaction causing severe rash involving mucus membranes or skin necrosis: No Has patient had a PCN reaction that required  hospitalization: No Has patient had a PCN reaction occurring within the last 10 years: No If all of the above answers are "NO", then may proceed with Cephalosporin use.   . Codeine Nausea And Vomiting  . Levaquin [Levofloxacin In D5w] Nausea And Vomiting  . Other Nausea And Vomiting    Pt does not want to take any pain meds    Social History:   Social History   Socioeconomic History  . Marital status: Divorced    Spouse name: Not on file  . Number of children: 2  . Years of education: Not on file  . Highest education level: Not on file  Occupational History  . Occupation: Airline pilot: [employer]  Tobacco Use  . Smoking status: Former Smoker    Packs/day: 1.00    Types: Cigarettes    Quit date: May 31, 2008    Years since quitting: 11.5  . Smokeless tobacco: Never Used  . Tobacco comment: pt unsure how many years she smoked  Vaping Use  . Vaping Use: Never used  Substance and Sexual Activity  . Alcohol use: No  . Drug use: No  . Sexual activity: Not Currently  Other Topics Concern  . Not  on file  Social History Narrative   Retired, works part time   Divorced   Former Smoker   1- Son alcoholic abusive   Daughter died in 2006/06/01   Social Determinants of Health   Financial Resource Strain: High Risk  . Difficulty of Paying Living Expenses: Very hard  Food Insecurity: Food Insecurity Present  . Worried About Charity fundraiser in the Last Year: Often true  . Ran Out of Food in the Last Year: Often true  Transportation Needs: Unmet Transportation Needs  . Lack of Transportation (Medical): Yes  . Lack of Transportation (Non-Medical): Yes  Physical Activity: Unknown  . Days of Exercise per Week: Patient refused  . Minutes of Exercise per Session: Not on file  Stress: No Stress Concern Present  . Feeling of Stress : Not at all  Social Connections:   . Frequency of Communication with Friends and Family:   . Frequency of Social Gatherings with Friends and Family:   . Attends Religious Services:   . Active Member of Clubs or Organizations:   . Attends Archivist Meetings:   Marland Kitchen Marital Status:   Intimate Partner Violence:   . Fear of Current or Ex-Partner:   . Emotionally Abused:   Marland Kitchen Physically Abused:   . Sexually Abused:     Family History:    Family History  Problem Relation Age of Onset  . Heart disease Father   . Arthritis Other        Family history of  . Coronary artery disease Other        Family history of  . Hypertension Other        Family history of  . Diabetes Daughter        22 died     ROS:  Please see the history of present illness.   All other ROS reviewed and negative.     Physical Exam/Data:   Vitals:   09/16/19 1147 09/16/19 1308 09/16/19 1325 09/16/19 1352  BP: (!) 152/65 (!) 143/86    Pulse: 83 85 83 (!) 39  Resp: 18 16 20 15   Temp: 98.2 F (36.8 C)     TempSrc: Oral     SpO2: 92% 95% 100% 90%  Weight:  Height:        Intake/Output Summary (Last 24 hours) at 09/16/2019 1409 Last data filed at 09/16/2019  1300 Gross per 24 hour  Intake 978.83 ml  Output 350 ml  Net 628.83 ml   Last 3 Weights 09/16/2019 09/15/2019 09/12/2019  Weight (lbs) 125 lb 10.6 oz 125 lb 132 lb 4.4 oz  Weight (kg) 57 kg 56.7 kg 60 kg     Body mass index is 21.57 kg/m.  General:  Elderly female in NAD HEENT: normal Neck: no JVD Vascular: No carotid bruits Cardiac:  normal S1, S2; RRR; no murmur  Lungs:  Crackles in bases bilaterally Abd: soft, nontender, no hepatomegaly  Ext: mild B LE edema Musculoskeletal:  No deformities, BUE and BLE strength normal and equal Skin: warm and dry  Neuro:  CNs 2-12 intact, no focal abnormalities noted Psych:  Normal affect   EKG:  The EKG was personally reviewed and demonstrates:  Sinus rhythm with HR 91 Telemetry:  Telemetry was personally reviewed and demonstrates:  Sinus rhythm with HR 70s  Relevant CV Studies:  LHC 08/26/19:   1st Diag lesion is 100% stenosed.  Balloon angioplasty was performed using a BALLOON SAPPHIRE 2.0X12.  Post intervention, there is a 50% residual stenosis.  Mid LAD lesion is 30% stenosed.  1. Total occlusion of the superior branch of the first diagonal with contrast staining, consistent with infarct vessel, treated with balloon angioplasty alone due to small vessel size and poor distal runoff 2. Calcified coronary arteries with no significant obstructive disease in the left main, left circumflex, LAD proper, or RCA 3. Minimally elevated LVEDP  Plan: ICU care, add clopidogrel when nausea subsides, risk reduction measures as tolerated. Unclear whether this is a de novo plaque rupture versus an embolic event. Patient does have known paroxysmal atrial fibrillation and has refused anticoagulation.  Echocardiogram 08/27/19:  1. Left ventricular ejection fraction, by estimation, is 60 to 65%. The  left ventricle has normal function. Left ventricular endocardial border  not optimally defined to evaluate regional wall motion. There is mild   concentric left ventricular  hypertrophy. Left ventricular diastolic function could not be evaluated.  2. Right ventricular systolic function is normal. The right ventricular  size is normal.  3. Left atrial size was moderately dilated.  4. The mitral valve is normal in structure. No evidence of mitral valve  regurgitation. No evidence of mitral stenosis.  5. The aortic valve is tricuspid. Aortic valve regurgitation is mild.  Laboratory Data:  High Sensitivity Troponin:   Recent Labs  Lab 08/26/19 1240 09/15/19 1228 09/15/19 1550 09/15/19 2000 09/16/19 0820  TROPONINIHS 3,863* 98* 135* 137* 97*     Chemistry Recent Labs  Lab 09/12/19 2012 09/15/19 1228 09/16/19 0820  NA 140 143 138  K 3.5 3.0* 3.8  CL 105 106 100  CO2 24 26 25   GLUCOSE 101* 90 88  BUN 18 15 17   CREATININE 1.54* 1.34* 1.57*  CALCIUM 8.8* 8.7* 8.6*  GFRNONAA 31* 37* 31*  GFRAA 36* 43* 35*  ANIONGAP 11 11 13     Recent Labs  Lab 09/12/19 2012 09/15/19 1228 09/16/19 0820  PROT 6.6 6.2* 5.7*  ALBUMIN 3.5 3.4* 3.2*  AST 27 33 29  ALT 15 17 18   ALKPHOS 50 51 46  BILITOT 1.0 0.8 0.6   Hematology Recent Labs  Lab 09/12/19 2012 09/15/19 1228 09/16/19 0820  WBC 10.3 11.1* 13.7*  RBC 4.54 4.42 3.94  HGB 13.5 13.7 12.0  HCT 42.9 41.8  37.5  MCV 94.5 94.6 95.2  MCH 29.7 31.0 30.5  MCHC 31.5 32.8 32.0  RDW 15.8* 15.6* 15.5  PLT 410* 347 289   BNP Recent Labs  Lab 09/15/19 1229  BNP 2,581.2*    DDimer  Recent Labs  Lab 09/16/19 0820  DDIMER 1.06*     Radiology/Studies:  DG Chest 2 View  Result Date: 09/15/2019 CLINICAL DATA:  Shortness of breath EXAM: CHEST - 2 VIEW COMPARISON:  09/12/2019 FINDINGS: Trace pleural effusions. Bibasilar atelectasis. Chronic interstitial prominence. No pneumothorax. Top normal heart size. No acute osseous abnormality. Diffuse calcification of the visualized aorta. IMPRESSION: Persistent trace pleural effusions and bibasilar atelectasis.  Electronically Signed   By: Macy Mis M.Coffey.   On: 09/15/2019 14:14   DG Chest 2 View  Result Date: 09/12/2019 CLINICAL DATA:  Shortness of breath. EXAM: CHEST - 2 VIEW COMPARISON:  August 26, 2019 FINDINGS: The lungs are hyperinflated. Mild, diffuse chronic appearing increased interstitial lung markings are seen. Very mild areas of atelectasis are noted within the bilateral lung bases. Small bilateral pleural effusions are seen. No pneumothorax is identified. The cardiac silhouette is borderline in size. There is marked severity calcification of the aortic arch. The visualized skeletal structures are unremarkable. Radiopaque surgical clips are seen overlying the left upper quadrant. IMPRESSION: 1. Chronic appearing increased interstitial lung markings with mild bibasilar atelectasis. 2. Small bilateral pleural effusions. Electronically Signed   By: Virgina Norfolk M.Coffey.   On: 09/12/2019 20:47   CT Head Wo Contrast  Result Date: 09/15/2019 CLINICAL DATA:  Focal neurological deficit, stroke suspected EXAM: CT HEAD WITHOUT CONTRAST TECHNIQUE: Contiguous axial images were obtained from the base of the skull through the vertex without intravenous contrast. COMPARISON:  08/13/2016 head CT. FINDINGS: Brain: No acute infarct or intracranial hemorrhage. No mass lesion. No midline shift or extra-axial fluid collection. Mild parenchymal volume loss with ex vacuo dilatation is unchanged. Vascular: No hyperdense vessel. Bilateral carotid siphon and V4 segment atherosclerotic calcifications. Skull: Negative for fracture or focal lesion. Sinuses/Orbits: Normal orbits. Clear paranasal sinuses. No mastoid effusion. Other: None. IMPRESSION: No acute intracranial process. Mild cerebral atrophy, similar to prior exam and within normal limits for patient's age. Electronically Signed   By: Primitivo Gauze M.Coffey.   On: 09/15/2019 14:09   {  New York Heart Association (NYHA) Functional Class NYHA Class III  Assessment  and Plan:   Elevated troponin Recent lateral STEMI - hs troponin trend 98 --> 135 --> 137 --> 97 - EKG does not appear ischemic - continue ASA and plavix - continue BB and statin - suspect this is demand ischemia, not an ACS process - will work to control BP and diurese   Hypertensive urgency - BP elevated on arrival 190s/100s - started on nitro drip in ER - continue amlodipine 5 mg, lopressor 25 mg BID - nitro has been weaned off and pressure is 128/57 - if more pressure control needed, could consider increasing norvasc vs adding imdur   Shortness of breath Elevated BNP - likely related to hypertensive urgency, but Coffey-dimer is also elevated - BNP elevated this admission - consider diastolic dysfunction, but will obtain echo to rule out new reduced EF - if RV strain, would obtain VQ scan to rule out PE - agree with lasix - has received 20 mg IV lasix x 1, 40 mg IV lasix x 1 - question I&Os - will continue with 40 mg IV lasix BID    CKD stage III - sCr 1.57 -  appears to be near her baseline   Right forearm pain - tender to palpation, but tender in other places as well - strong radial pulse - will obtain venous doppler to rule out DVT     For questions or updates, please contact [place] Please consult [email or web address] for contact info under    Signed, Ledora Bottcher, PA  09/16/2019 2:09 PM

## 2019-09-16 NOTE — Progress Notes (Signed)
PROGRESS NOTE  [name]:[id number] DOB: Oct 26, 1935 DOA: 09/15/2019 PCP: Cassandria Anger, MD   LOS: 0 days   Brief Narrative / Interim history: 84 year old female with recent MI status post stenting 3 weeks ago, CKD stage III, hypertension, A. fib, B12 deficiency, COPD, depression came in with elevated blood pressure associated with shortness of breath.  She was admitted 6/22 for acute lateral STEMI and underwent PTCA of the first diagonal which is 100% stenosed.  Peak high-sensitivity troponin was 3863.  She was also hypoxic on discharge and was placed on home oxygen.  Subjective / 24h Interval events: She feels better this morning on nitro drip but does complain of a headache.  Patient also has been complaining of right forearm pain which is severe at times with onset at the timing of last cardiac catheterization.  Assessment & Plan: Principal Problem Hypertensive emergency-currently on nitro drip, have resumed her home medication with amlodipine, metoprolol.  She is also on furosemide for diuresis.  Cardiology consulted and will evaluate patient shortly.  Attempt to wean off nitroglycerin but it seems like she is still requiring it this afternoon  Active Problems Coronary artery disease status post recent stenting-mild troponin leak, probably related to hypertension and doubt ACS.  Cardiology to see.  Continue Plavix, beta-blocker  Chronic kidney disease stage IIIb-Baseline creatinine around 1.4, currently around baseline.  Stable in the last couple of days.  Continue to monitor, avoid nephrotoxins  COPD with chronic bronchitis-this appears stable, she has no wheezing.  Right arm pain-unclear etiology, arm is warm, she has good pulses, there is no erythema / cellulitis / swelling  GAD-chronic, stable, continue home medications  Acute on chronic hypoxic respiratory failure due to pulmonary edema in the setting of hypertensive emergency-diuresis  Paroxysmal A. fib-continue  aspirin, beta-blocker, she is not anticoagulated  Hyperlipidemia-continue statin  Scheduled Meds: . amLODipine  5 mg Oral Daily  . aspirin  81 mg Oral Daily  . Chlorhexidine Gluconate Cloth  6 each Topical Daily  . clopidogrel  75 mg Oral Q breakfast  . docusate sodium  100 mg Oral BID  . furosemide  40 mg Intravenous Q12H  . mouth rinse  15 mL Mouth Rinse BID  . metoprolol tartrate  25 mg Oral BID  . nystatin   Topical TID  . pantoprazole  40 mg Oral Daily  . rosuvastatin  20 mg Oral Daily  . sodium chloride flush  3 mL Intravenous Q12H  . sodium chloride flush  3 mL Intravenous Q12H   Continuous Infusions: . sodium chloride 15 mL/hr at 09/15/19 1335  . sodium chloride    . nitroGLYCERIN 45 mcg/min (09/16/19 1000)   PRN Meds:.sodium chloride, acetaminophen **OR** acetaminophen, HYDROcodone-acetaminophen, ondansetron **OR** ondansetron (ZOFRAN) IV, sodium chloride flush, traMADol  Diet Orders (From admission, onward)    Start     Ordered   09/16/19 0057  Diet Heart Room service appropriate? Yes; Fluid consistency: Thin  Diet effective now       Question Answer Comment  Room service appropriate? Yes   Fluid consistency: Thin      09/16/19 0056          DVT prophylaxis: SCDs Start: 09/16/19 0057     Code Status: Full Code  Family Communication: no family at bedside   Status is: Observation  The patient will require care spanning > 2 midnights and should be moved to inpatient because: IV treatments appropriate due to intensity of illness or inability to take PO and  Inpatient level of care appropriate due to severity of illness  Dispo: The patient is from: Home              Anticipated d/c is to: Home              Anticipated d/c date is: 2 days              Patient currently is not medically stable to d/c.  Consultants:  Cardiology   Procedures:  None   Microbiology  None   Antimicrobials: None     Objective: Vitals:   09/16/19 1000 09/16/19 1012  09/16/19 1100 09/16/19 1147  BP: (!) 162/60  (!) 155/51 (!) 152/65  Pulse: 80 80 79 83  Resp: 19 16 16 18   Temp:    98.2 F (36.8 C)  TempSrc:    Oral  SpO2: 92% 93% 94% 92%  Weight:      Height:        Intake/Output Summary (Last 24 hours) at 09/16/2019 1304 Last data filed at 09/16/2019 1157 Gross per 24 hour  Intake 155.12 ml  Output 350 ml  Net -194.88 ml   Filed Weights   09/15/19 1110 09/16/19 0435  Weight: 56.7 kg 57 kg    Examination:  Constitutional: NAD Eyes: no scleral icterus ENMT: Mucous membranes are moist.  Neck: normal, supple Respiratory: Faint bibasilar crackles, no wheezing.  Diminished at the bases Cardiovascular: Regular rate and rhythm.  Trace edema Abdomen: non distended, no tenderness. Bowel sounds positive.  Musculoskeletal: no clubbing / cyanosis.  Skin: no rashes Neurologic: CN 2-12 grossly intact. Strength 5/5 in all 4.  Psychiatric: Normal judgment and insight. Alert and oriented x 3. Normal mood.    Data Reviewed: I have independently reviewed following labs and imaging studies   CBC: Recent Labs  Lab 09/12/19 2012 09/15/19 1228 09/16/19 0820  WBC 10.3 11.1* 13.7*  NEUTROABS 5.9 7.7 9.0*  HGB 13.5 13.7 12.0  HCT 42.9 41.8 37.5  MCV 94.5 94.6 95.2  PLT 410* 347 956   Basic Metabolic Panel: Recent Labs  Lab 09/12/19 2012 09/15/19 1228 09/16/19 0820  NA 140 143 138  K 3.5 3.0* 3.8  CL 105 106 100  CO2 24 26 25   GLUCOSE 101* 90 88  BUN 18 15 17   CREATININE 1.54* 1.34* 1.57*  CALCIUM 8.8* 8.7* 8.6*  MG  --   --  1.7  PHOS  --   --  3.2   Liver Function Tests: Recent Labs  Lab 09/12/19 2012 09/15/19 1228 09/16/19 0820  AST 27 33 29  ALT 15 17 18   ALKPHOS 50 51 46  BILITOT 1.0 0.8 0.6  PROT 6.6 6.2* 5.7*  ALBUMIN 3.5 3.4* 3.2*   Coagulation Profile: Recent Labs  Lab 09/15/19 1228  INR 1.0   HbA1C: No results for input(s): HGBA1C in the last 72 hours. CBG: No results for input(s): GLUCAP in the last 168  hours.  Recent Results (from the past 240 hour(s))  MRSA PCR Screening     Status: None   Collection Time: 09/16/19  4:30 AM   Specimen: Nasopharyngeal  Result Value Ref Range Status   MRSA by PCR NEGATIVE NEGATIVE Final    Comment:        The GeneXpert MRSA Assay (FDA approved for NASAL specimens only), is one component of a comprehensive MRSA colonization surveillance program. It is not intended to diagnose MRSA infection nor to guide or monitor treatment for MRSA infections. Performed at  [facility], [place] [address], [place], [place] [address]   SARS Coronavirus 2 by RT PCR (hospital order, performed in [facility] hospital lab) Nasopharyngeal Nasopharyngeal Swab     Status: None   Collection Time: 09/16/19  7:11 AM   Specimen: Nasopharyngeal Swab  Result Value Ref Range Status   SARS Coronavirus 2 NEGATIVE NEGATIVE Final    Comment: (NOTE) SARS-CoV-2 target nucleic acids are NOT DETECTED.  The SARS-CoV-2 RNA is generally detectable in upper and lower respiratory specimens during the acute phase of infection. The lowest concentration of SARS-CoV-2 viral copies this assay can detect is 250 copies / mL. A negative result does not preclude SARS-CoV-2 infection and should not be used as the sole basis for treatment or other patient management decisions.  A negative result may occur with improper specimen collection / handling, submission of specimen other than nasopharyngeal swab, presence of viral mutation(s) within the areas targeted by this assay, and inadequate number of viral copies (<250 copies / mL). A negative result must be combined with clinical observations, patient history, and epidemiological information.  Fact Sheet for Patients:   StrictlyIdeas.no  Fact Sheet for Healthcare Providers: BankingDealers.co.za  This test is not yet approved or  cleared by the [place] FDA and has been  authorized for detection and/or diagnosis of SARS-CoV-2 by FDA under an Emergency Use Authorization (EUA).  This EUA will remain in effect (meaning this test can be used) for the duration of the COVID-19 declaration under Section 564(b)(1) of the Act, 21 U.S.C. section 360bbb-3(b)(1), unless the authorization is terminated or revoked sooner.  Performed at [facility], [place] [address], [place], [place] [address]      Radiology Studies: DG Chest 2 View  Result Date: 09/15/2019 CLINICAL DATA:  Shortness of breath EXAM: CHEST - 2 VIEW COMPARISON:  09/12/2019 FINDINGS: Trace pleural effusions. Bibasilar atelectasis. Chronic interstitial prominence. No pneumothorax. Top normal heart size. No acute osseous abnormality. Diffuse calcification of the visualized aorta. IMPRESSION: Persistent trace pleural effusions and bibasilar atelectasis. Electronically Signed   By: Macy Mis M.D.   On: 09/15/2019 14:14   CT Head Wo Contrast  Result Date: 09/15/2019 CLINICAL DATA:  Focal neurological deficit, stroke suspected EXAM: CT HEAD WITHOUT CONTRAST TECHNIQUE: Contiguous axial images were obtained from the base of the skull through the vertex without intravenous contrast. COMPARISON:  08/13/2016 head CT. FINDINGS: Brain: No acute infarct or intracranial hemorrhage. No mass lesion. No midline shift or extra-axial fluid collection. Mild parenchymal volume loss with ex vacuo dilatation is unchanged. Vascular: No hyperdense vessel. Bilateral carotid siphon and V4 segment atherosclerotic calcifications. Skull: Negative for fracture or focal lesion. Sinuses/Orbits: Normal orbits. Clear paranasal sinuses. No mastoid effusion. Other: None. IMPRESSION: No acute intracranial process. Mild cerebral atrophy, similar to prior exam and within normal limits for patient's age. Electronically Signed   By: Primitivo Gauze M.D.   On: 09/15/2019 14:09    Marzetta Board, MD, PhD [facility]  Between 7 am - 7 pm I am available, please contact me via Amion or Securechat  Between 7 pm - 7 am I am not available, please contact night coverage MD/APP via Amion

## 2019-09-16 NOTE — Progress Notes (Signed)
Pt c/o of significant pain rt arm , refusses b/p or lab draws in that arm.  Hand warm, pulse 2+

## 2019-09-17 DIAGNOSIS — I2510 Atherosclerotic heart disease of native coronary artery without angina pectoris: Secondary | ICD-10-CM | POA: Diagnosis not present

## 2019-09-17 DIAGNOSIS — I5031 Acute diastolic (congestive) heart failure: Secondary | ICD-10-CM | POA: Diagnosis not present

## 2019-09-17 DIAGNOSIS — N179 Acute kidney failure, unspecified: Secondary | ICD-10-CM | POA: Diagnosis not present

## 2019-09-17 DIAGNOSIS — R778 Other specified abnormalities of plasma proteins: Secondary | ICD-10-CM | POA: Diagnosis not present

## 2019-09-17 LAB — COMPREHENSIVE METABOLIC PANEL
ALT: 16 U/L (ref 0–44)
AST: 25 U/L (ref 15–41)
Albumin: 3.1 g/dL — ABNORMAL LOW (ref 3.5–5.0)
Alkaline Phosphatase: 46 U/L (ref 38–126)
Anion gap: 16 — ABNORMAL HIGH (ref 5–15)
BUN: 18 mg/dL (ref 8–23)
CO2: 24 mmol/L (ref 22–32)
Calcium: 8.3 mg/dL — ABNORMAL LOW (ref 8.9–10.3)
Chloride: 96 mmol/L — ABNORMAL LOW (ref 98–111)
Creatinine, Ser: 1.79 mg/dL — ABNORMAL HIGH (ref 0.44–1.00)
GFR calc Af Amer: 30 mL/min — ABNORMAL LOW (ref >60)
GFR calc non Af Amer: 26 mL/min — ABNORMAL LOW (ref >60)
Glucose, Bld: 77 mg/dL (ref 70–99)
Potassium: 3.2 mmol/L — ABNORMAL LOW (ref 3.5–5.1)
Sodium: 136 mmol/L (ref 135–145)
Total Bilirubin: 0.8 mg/dL (ref 0.3–1.2)
Total Protein: 5.7 g/dL — ABNORMAL LOW (ref 6.5–8.1)

## 2019-09-17 LAB — CBC
HCT: 36.3 % (ref 36.0–46.0)
Hemoglobin: 11.6 g/dL — ABNORMAL LOW (ref 12.0–15.0)
MCH: 30.5 pg (ref 26.0–34.0)
MCHC: 32 g/dL (ref 30.0–36.0)
MCV: 95.5 fL (ref 80.0–100.0)
Platelets: 255 10*3/uL (ref 150–400)
RBC: 3.8 MIL/uL — ABNORMAL LOW (ref 3.87–5.11)
RDW: 15.3 % (ref 11.5–15.5)
WBC: 13 10*3/uL — ABNORMAL HIGH (ref 4.0–10.5)
nRBC: 0 % (ref 0.0–0.2)

## 2019-09-17 MED ORDER — POTASSIUM CHLORIDE CRYS ER 20 MEQ PO TBCR
40.0000 meq | EXTENDED_RELEASE_TABLET | Freq: Once | ORAL | Status: AC
  Administered 2019-09-17: 40 meq via ORAL
  Filled 2019-09-17: qty 2

## 2019-09-17 MED ORDER — METOPROLOL TARTRATE 25 MG PO TABS
37.5000 mg | ORAL_TABLET | Freq: Two times a day (BID) | ORAL | Status: DC
  Administered 2019-09-17 – 2019-09-18 (×2): 37.5 mg via ORAL
  Filled 2019-09-17 (×2): qty 2

## 2019-09-17 MED ORDER — POTASSIUM CHLORIDE CRYS ER 20 MEQ PO TBCR
20.0000 meq | EXTENDED_RELEASE_TABLET | Freq: Two times a day (BID) | ORAL | Status: DC
  Administered 2019-09-17 (×2): 20 meq via ORAL
  Filled 2019-09-17 (×2): qty 1

## 2019-09-17 MED ORDER — ISOSORBIDE MONONITRATE ER 30 MG PO TB24
30.0000 mg | ORAL_TABLET | Freq: Every day | ORAL | Status: DC
  Administered 2019-09-17 – 2019-09-19 (×3): 30 mg via ORAL
  Filled 2019-09-17 (×3): qty 1

## 2019-09-17 NOTE — Progress Notes (Signed)
PT Cancellation Note  Patient Details Name: [name] MRN: [id number] DOB: 1935-07-14   Cancelled Treatment:    Reason Eval/Treat Not Completed: Other (comment)per RN, patient has been up and back to bed, check back later.   Claretha Cooper 09/17/2019, 10:58 AM  [place] Pager [phone] Office [phone]

## 2019-09-17 NOTE — Evaluation (Addendum)
Occupational Therapy Evaluation Patient Details Name: [name] MRN: [id number] DOB: 13-May-1935 Today's Date: 09/17/2019    History of Present Illness 84 year old female with recent MI status post stenting 3 weeks ago, CKD stage III, hypertension, A. fib, B12 deficiency, COPD, depression came in with elevated blood pressure associated with shortness of breath.  She was admitted 6/22 for acute lateral STEMI and underwent PTCA of the first diagonal which is 100% stenosed.  Peak high-sensitivity troponin was 3863.  She was also hypoxic on discharge and was placed on home oxygen   Clinical Impression   Patient overall supervision level for safety during OT evaluation, able to perform toilet transfer, hand hygiene and LB dressing task at supervision level as patient is mildly impulsive and moving quickly during LB dressing task. Patient reports her sister comes over daily, recommend initial 24/7 supervision to maximize patient safety at home as she has not been as mobile "I'm so stiff," do not anticipate OT needs at discharge.    Follow Up Recommendations  No OT follow up;Supervision/Assistance - 24 hour (initially)    Equipment Recommendations  None recommended by OT       Precautions / Restrictions Precautions Precautions: Fall Precaution Comments: monitor VS Restrictions Weight Bearing Restrictions: No      Mobility Bed Mobility Overal bed mobility: Independent                Transfers Overall transfer level: Needs assistance Equipment used: None Transfers: Sit to/from Stand Sit to Stand: Supervision         General transfer comment: patient mildly unsteady with first stand "I feel so stiff" supervision level with continued mobility    Balance Overall balance assessment: Mild deficits observed, not formally tested                                         ADL either performed or assessed with clinical judgement   ADL Overall ADL's : Needs  assistance/impaired     Grooming: Wash/dry hands;Supervision/safety;Standing   Upper Body Bathing: Sitting;Standing;Set up   Lower Body Bathing: Supervison/ safety;Sit to/from stand   Upper Body Dressing : Set up;Sitting   Lower Body Dressing: Supervision/safety;Sit to/from stand;Sitting/lateral leans Lower Body Dressing Details (indicate cue type and reason): patient able to don mesh underwear with supervision in standing for safety patient reports feeling very stiff from not moving as much Toilet Transfer: Supervision/safety;Ambulation;Regular Glass blower/designer Details (indicate cue type and reason): supervision for safety Toileting- Clothing Manipulation and Hygiene: Supervision/safety;Sit to/from stand       Functional mobility during ADLs: Supervision/safety;Cueing for safety General ADL Comments: patient close to her baseline of I with self care, supervision level for safety as patient is mildly impulsive moving quickly during dressing requiring assist from OT to manage leads/wires only                  Pertinent Vitals/Pain Pain Assessment: Faces Faces Pain Scale: Hurts little more Pain Location: back Pain Descriptors / Indicators: Aching Pain Intervention(s): Monitored during session     Hand Dominance  (did not specify)   Extremity/Trunk Assessment Upper Extremity Assessment Upper Extremity Assessment: Overall WFL for tasks assessed   Lower Extremity Assessment Lower Extremity Assessment: Defer to PT evaluation   Cervical / Trunk Assessment Cervical / Trunk Assessment: Normal   Communication Communication Communication: HOH   Cognition Arousal/Alertness: Awake/alert Behavior During Therapy: [facility]  for tasks assessed/performed Overall Cognitive Status: Within Functional Limits for tasks assessed                                               Home Living Family/patient expects to be discharged to:: Private residence Living  Arrangements: Alone Available Help at Discharge: Family;Available PRN/intermittently Type of Home: Apartment Home Access: Level entry     Home Layout: One level     Bathroom Shower/Tub: Teacher, early years/pre: Standard     Home Equipment: None          Prior Functioning/Environment Level of Independence: Independent        Comments: does not drive, sister comes over daily        OT Problem List: Decreased activity tolerance;Pain;Decreased safety awareness;Impaired balance (sitting and/or standing)      OT Treatment/Interventions: Self-care/ADL training;Therapeutic exercise;Patient/family education;Balance training    OT Goals(Current goals can be found in the care plan section) Acute Rehab OT Goals Patient Stated Goal: to walk OT Goal Formulation: With patient Time For Goal Achievement: 10/01/19 Potential to Achieve Goals: Good  OT Frequency: Min 2X/week    AM-PAC OT "6 Clicks" Daily Activity     Outcome Measure Help from another person eating meals?: None Help from another person taking care of personal grooming?: A Little Help from another person toileting, which includes using toliet, bedpan, or urinal?: A Little Help from another person bathing (including washing, rinsing, drying)?: A Little Help from another person to put on and taking off regular upper body clothing?: A Little Help from another person to put on and taking off regular lower body clothing?: A Little 6 Click Score: 19   End of Session Nurse Communication: Mobility status  Activity Tolerance: Patient tolerated treatment well Patient left:  (with PT)  OT Visit Diagnosis: Other abnormalities of gait and mobility (R26.89);Pain Pain - part of body:  (back)                Time: 4259-5638 OT Time Calculation (min): 11 min Charges:  OT General Charges $OT Visit: 1 Visit OT Evaluation $OT Eval Low Complexity: [place] OT Pager: [phone]  Rosemary Holms 09/17/2019,  3:30 PM

## 2019-09-17 NOTE — Progress Notes (Signed)
PROGRESS NOTE  [name]:[id number] DOB: September 17, 1935 DOA: 09/15/2019 PCP: Cassandria Anger, MD   LOS: 1 day   Brief Narrative / Interim history: 84 year old female with recent MI status post stenting 3 weeks ago, CKD stage III, hypertension, A. fib, B12 deficiency, COPD, depression came in with elevated blood pressure associated with shortness of breath.  She was admitted 6/22 for acute lateral STEMI and underwent PTCA of the first diagonal which is 100% stenosed.  Peak high-sensitivity troponin was 3863.  She was also hypoxic on discharge and was placed on home oxygen.  Subjective: No acute complains, no fever or chills.  Assessment & Plan: Hypertensive emergency currently on nitro drip,  have resumed her home medication with amlodipine, metoprolol.   She is also on furosemide for diuresis, will stop it due to renal function. Cardiology consulted and meds adjusted.  On imdur   Coronary artery disease status post recent stenting mild troponin leak,  probably related to hypertension and doubt ACS. Continue Plavix, beta-blocker  AKI on Chronic kidney disease stage IIIb Baseline creatinine around 1.4, mild worsened.  Stable in the last couple of days. Continue to monitor, avoid nephrotoxins  COPD with chronic bronchitis This appears stable, she has no wheezing.  Right arm pain She has good pulses, there is no erythema / cellulitis / swelling  GAD- chronic, stable, continue home medications  Acute on chronic hypoxic respiratory failure due to pulmonary edema in the setting of hypertensive emergency- diuresis  Paroxysmal A. fib- continue aspirin, beta-blocker, she is not anticoagulated  Hyperlipidemia- continue statin  Scheduled Meds: . amLODipine  10 mg Oral Daily  . aspirin  81 mg Oral Daily  . Chlorhexidine Gluconate Cloth  6 each Topical Daily  . clopidogrel  75 mg Oral Q breakfast  . docusate sodium  100 mg Oral BID  . isosorbide mononitrate  30 mg  Oral Daily  . mouth rinse  15 mL Mouth Rinse BID  . metoprolol tartrate  37.5 mg Oral BID  . nystatin   Topical TID  . pantoprazole  40 mg Oral Daily  . potassium chloride  20 mEq Oral BID  . rosuvastatin  20 mg Oral Daily  . sodium chloride flush  3 mL Intravenous Q12H  . sodium chloride flush  3 mL Intravenous Q12H   Continuous Infusions: . sodium chloride 10 mL/hr at 09/16/19 1717  . sodium chloride     PRN Meds:.sodium chloride, acetaminophen **OR** acetaminophen, HYDROcodone-acetaminophen, ondansetron **OR** ondansetron (ZOFRAN) IV, sodium chloride flush, traMADol  Diet Orders (From admission, onward)    Start     Ordered   09/16/19 0057  Diet Heart Room service appropriate? Yes; Fluid consistency: Thin  Diet effective now       Question Answer Comment  Room service appropriate? Yes   Fluid consistency: Thin      09/16/19 0056         DVT prophylaxis: SCDs Start: 09/16/19 0057    Code Status: Full Code  Family Communication: no family at bedside   Status is: inpatient  Dispo: The patient is from: Home              Anticipated d/c is to: Home              Anticipated d/c date is: 1 days              Patient currently is not medically stable to d/c.  Consultants:  Cardiology   Procedures:  None  Microbiology  None   Antimicrobials: None     Objective: Vitals:   09/17/19 1300 09/17/19 1500 09/17/19 1600 09/17/19 1658  BP: (!) 145/51 129/68 (!) 161/54 (!) 155/65  Pulse: 78 78 86 90  Resp: 14 17 14 16   Temp:   98.4 F (36.9 C) 98.2 F (36.8 C)  TempSrc:   Oral Oral  SpO2: 97% 96% 96% 97%  Weight:      Height:        Intake/Output Summary (Last 24 hours) at 09/17/2019 1909 Last data filed at 09/17/2019 1700 Gross per 24 hour  Intake 240 ml  Output 150 ml  Net 90 ml   Filed Weights   09/15/19 1110 09/16/19 0435 09/17/19 0500  Weight: 56.7 kg 57 kg 57 kg    Examination:  General: Appear in mild distress, no Rash; Oral Mucosa Clear, moist.  no Abnormal Neck Mass Or lumps, Conjunctiva normal  Cardiovascular: S1 and S2 Present, no Murmur, Respiratory: good respiratory effort, Bilateral Air entry present and Clear to Auscultation, no Crackles, no wheezes Abdomen: Bowel Sound present, Soft and no tenderness Extremities: no Pedal edema, no calf tenderness Neurology: alert and oriented to time, place, and person affect appropriate. no new focal deficit Gait not checked due to patient safety concerns    Data Reviewed: I have independently reviewed following labs and imaging studies   CBC: Recent Labs  Lab 09/12/19 2012 09/15/19 1228 09/16/19 0820 09/17/19 0251  WBC 10.3 11.1* 13.7* 13.0*  NEUTROABS 5.9 7.7 9.0*  --   HGB 13.5 13.7 12.0 11.6*  HCT 42.9 41.8 37.5 36.3  MCV 94.5 94.6 95.2 95.5  PLT 410* 347 289 622   Basic Metabolic Panel: Recent Labs  Lab 09/12/19 2012 09/15/19 1228 09/16/19 0820 09/17/19 0251  NA 140 143 138 136  K 3.5 3.0* 3.8 3.2*  CL 105 106 100 96*  CO2 24 26 25 24   GLUCOSE 101* 90 88 77  BUN 18 15 17 18   CREATININE 1.54* 1.34* 1.57* 1.79*  CALCIUM 8.8* 8.7* 8.6* 8.3*  MG  --   --  1.7  --   PHOS  --   --  3.2  --    Liver Function Tests: Recent Labs  Lab 09/12/19 2012 09/15/19 1228 09/16/19 0820 09/17/19 0251  AST 27 33 29 25  ALT 15 17 18 16   ALKPHOS 50 51 46 46  BILITOT 1.0 0.8 0.6 0.8  PROT 6.6 6.2* 5.7* 5.7*  ALBUMIN 3.5 3.4* 3.2* 3.1*   Coagulation Profile: Recent Labs  Lab 09/15/19 1228  INR 1.0   HbA1C: No results for input(s): HGBA1C in the last 72 hours. CBG: No results for input(s): GLUCAP in the last 168 hours.  Recent Results (from the past 240 hour(s))  MRSA PCR Screening     Status: None   Collection Time: 09/16/19  4:30 AM   Specimen: Nasopharyngeal  Result Value Ref Range Status   MRSA by PCR NEGATIVE NEGATIVE Final    Comment:        The GeneXpert MRSA Assay (FDA approved for NASAL specimens only), is one component of a comprehensive MRSA  colonization surveillance program. It is not intended to diagnose MRSA infection nor to guide or monitor treatment for MRSA infections. Performed at [facility], [place] [address] [place], [place] [address]   SARS Coronavirus 2 by RT PCR (hospital order, performed in [facility] hospital lab) Nasopharyngeal Nasopharyngeal Swab     Status: None   Collection  Time: 09/16/19  7:11 AM   Specimen: Nasopharyngeal Swab  Result Value Ref Range Status   SARS Coronavirus 2 NEGATIVE NEGATIVE Final    Comment: (NOTE) SARS-CoV-2 target nucleic acids are NOT DETECTED.  The SARS-CoV-2 RNA is generally detectable in upper and lower respiratory specimens during the acute phase of infection. The lowest concentration of SARS-CoV-2 viral copies this assay can detect is 250 copies / mL. A negative result does not preclude SARS-CoV-2 infection and should not be used as the sole basis for treatment or other patient management decisions.  A negative result may occur with improper specimen collection / handling, submission of specimen other than nasopharyngeal swab, presence of viral mutation(s) within the areas targeted by this assay, and inadequate number of viral copies (<250 copies / mL). A negative result must be combined with clinical observations, patient history, and epidemiological information.  Fact Sheet for Patients:   StrictlyIdeas.no  Fact Sheet for Healthcare Providers: BankingDealers.co.za  This test is not yet approved or  cleared by the [place] FDA and has been authorized for detection and/or diagnosis of SARS-CoV-2 by FDA under an Emergency Use Authorization (EUA).  This EUA will remain in effect (meaning this test can be used) for the duration of the COVID-19 declaration under Section 564(b)(1) of the Act, 21 U.S.C. section 360bbb-3(b)(1), unless the authorization is terminated or revoked sooner.  Performed at  [facility], [place] [address] [place], [place] [address]      Radiology Studies: No results found.  Author:  Berle Mull, MD [facility] 09/17/2019  7:16 PM   To reach On-call, see care teams to locate the attending and reach out to them via [email or web address]. If 7PM-7AM, please contact night-coverage If you still have difficulty reaching the attending provider, please page the [facility] (Director on Call) for [facility] on amion for assistance.

## 2019-09-17 NOTE — Telephone Encounter (Signed)
Unable to get in touch with [name], left detailed message of below

## 2019-09-17 NOTE — Evaluation (Signed)
Physical Therapy Evaluation Patient Details Name: CYRENA KUCHENBECKER MRN: [id number] DOB: [birth year] Today's Date: 09/17/2019   History of Present Illness  84 year old female with recent MI status post stenting 3 weeks ago, CKD stage III, hypertension, A. fib, B12 deficiency, COPD, depression came in with elevated blood pressure associated with shortness of breath.  She was admitted 6/22 for acute lateral STEMI and underwent PTCA   She was also hypoxic on discharge and was placed on home oxygen  Clinical Impression  The patient is anxious about getting up and moving around when she so desires. Encouraged need for staff to assist patient. Patient ambulated with close supervision x 200' without AD. HR 90, SPO2 87% on RA, resting SPO2 95% on RA. Patient reports that she did not use home O2. Patient should progress to return home with family assisting/ supervision as PTA. Pt admitted with above diagnosis. Pt currently with functional limitations due to the deficits listed below (see PT Problem List). Pt will benefit from skilled PT to increase their independence and safety with mobility to allow discharge to the venue listed below.   .    Follow Up Recommendations No PT follow up;Supervision/Assistance - 24 hour initially    Equipment Recommendations  None recommended by PT    Recommendations for Other Services       Precautions / Restrictions Precautions Precautions: Fall Precaution Comments: monitor VS      Mobility  Bed Mobility Overal bed mobility: Independent                Transfers Overall transfer level: Needs assistance Equipment used: None Transfers: Sit to/from Stand Sit to Stand: Min guard;Min assist         General transfer comment: steady assist the first standing up, subsequent stands required supervision  Ambulation/Gait Ambulation/Gait assistance: Min guard Gait Distance (Feet): 200 Feet Assistive device: None Gait Pattern/deviations: Step-through  pattern Gait velocity: decr   General Gait Details: initially patient slightly  Stairs            Wheelchair Mobility    Modified Rankin (Stroke Patients Only)       Balance Overall balance assessment: Mild deficits observed, not formally tested                                           Pertinent Vitals/Pain Pain Assessment: Faces Faces Pain Scale: Hurts little more Pain Location: back Pain Descriptors / Indicators: Aching Pain Intervention(s): Repositioned    Home Living Family/patient expects to be discharged to:: Private residence Living Arrangements: Alone Available Help at Discharge: Family;Available PRN/intermittently Type of Home: Apartment Home Access: Level entry     Home Layout: One level Home Equipment: None      Prior Function Level of Independence: Independent         Comments: does not drive     Hand Dominance        Extremity/Trunk Assessment        Lower Extremity Assessment Lower Extremity Assessment: Generalized weakness    Cervical / Trunk Assessment Cervical / Trunk Assessment: Normal  Communication   Communication: HOH  Cognition Arousal/Alertness: Awake/alert Behavior During Therapy: WFL for tasks assessed/performed Overall Cognitive Status: Within Functional Limits for tasks assessed, did state she did not use home O2, at last Dc indicates that she did have O2.  General Comments: except date of July" I don't look at a calendar"      General Comments      Exercises     Assessment/Plan    PT Assessment Patient needs continued PT services  PT Problem List Decreased strength;Decreased activity tolerance;Decreased balance;Decreased mobility;Cardiopulmonary status limiting activity;Decreased knowledge of use of DME;Decreased knowledge of precautions;Pain       PT Treatment Interventions Gait training;Functional mobility training;Therapeutic  exercise;Therapeutic activities;Patient/family education    PT Goals (Current goals can be found in the Care Plan section)  Acute Rehab PT Goals Patient Stated Goal: go home PT Goal Formulation: With patient Time For Goal Achievement: 10/01/19    Frequency Min 3X/week   Barriers to discharge        Co-evaluation               AM-PAC PT "6 Clicks" Mobility  Outcome Measure Help needed turning from your back to your side while in a flat bed without using bedrails?: None Help needed moving from lying on your back to sitting on the side of a flat bed without using bedrails?: None Help needed moving to and from a bed to a chair (including a wheelchair)?: A Little Help needed standing up from a chair using your arms (e.g., wheelchair or bedside chair)?: A Little Help needed to walk in hospital room?: A Little Help needed climbing 3-5 steps with a railing? : A Little 6 Click Score: 20    End of Session Equipment Utilized During Treatment: Gait belt Activity Tolerance: Patient tolerated treatment well Patient left: in bed;with call bell/phone within reach;with bed alarm set Nurse Communication: Mobility status PT Visit Diagnosis: Muscle weakness (generalized) (M62.81)    Time: 0102-7253 PT Time Calculation (min) (ACUTE ONLY): 37 min   Charges:   PT Evaluation $PT Eval Low Complexity: 1 Low PT Treatments $Gait Training: 8-22 mins      Claretha Cooper 09/17/2019, 12:45 PM Tresa Endo [place] Pager [phone] Office [phone]

## 2019-09-17 NOTE — Progress Notes (Signed)
Progress Note  Patient Name: [name] Date of Encounter: 09/17/2019  [facility] Cardiologist: Sherren Mocha, MD   Subjective   Patient is very upset and states she is getting worse and wants to leave the hospital. She also states she only urinated once yesterday.  Inpatient Medications    Scheduled Meds: . amLODipine  10 mg Oral Daily  . aspirin  81 mg Oral Daily  . Chlorhexidine Gluconate Cloth  6 each Topical Daily  . clopidogrel  75 mg Oral Q breakfast  . docusate sodium  100 mg Oral BID  . isosorbide mononitrate  30 mg Oral Daily  . mouth rinse  15 mL Mouth Rinse BID  . metoprolol tartrate  25 mg Oral BID  . nystatin   Topical TID  . pantoprazole  40 mg Oral Daily  . potassium chloride  20 mEq Oral BID  . rosuvastatin  20 mg Oral Daily  . sodium chloride flush  3 mL Intravenous Q12H  . sodium chloride flush  3 mL Intravenous Q12H   Continuous Infusions: . sodium chloride 10 mL/hr at 09/16/19 1717  . sodium chloride    . nitroGLYCERIN 30 mcg/min (09/16/19 1719)   PRN Meds: sodium chloride, acetaminophen **OR** acetaminophen, HYDROcodone-acetaminophen, ondansetron **OR** ondansetron (ZOFRAN) IV, sodium chloride flush, traMADol   Vital Signs    Vitals:   09/17/19 0344 09/17/19 0400 09/17/19 0500 09/17/19 0700  BP:  (!) 158/62  (!) 143/74  Pulse:  84  85  Resp:  17  15  Temp: 98.9 F (37.2 C)     TempSrc: Oral     SpO2:  95%  97%  Weight:   57 kg   Height:        Intake/Output Summary (Last 24 hours) at 09/17/2019 0800 Last data filed at 09/16/2019 1822 Gross per 24 hour  Intake 858.99 ml  Output 950 ml  Net -91.01 ml   Last 3 Weights 09/17/2019 09/16/2019 09/15/2019  Weight (lbs) 125 lb 10.6 oz 125 lb 10.6 oz 125 lb  Weight (kg) 57 kg 57 kg 56.7 kg      Telemetry    Appears to be generally sinus rhythm with PVCs in 80-90s, one bout of possible atrial fibrillation but with artifact - Personally Reviewed  ECG    No new tracings - Personally  Reviewed  Physical Exam   GEN: No acute distress.   Neck: No JVD Cardiac: RRR, no murmurs, rubs, or gallops.  Respiratory: crackles in bases improved GI: Soft, nontender, non-distended  MS: mild B LE edema; No deformity. Neuro:  Nonfocal  Psych: Normal affect   Labs    High Sensitivity Troponin:   Recent Labs  Lab 08/26/19 1240 09/15/19 1228 09/15/19 1550 09/15/19 2000 09/16/19 0820  TROPONINIHS 3,863* 98* 135* 137* 97*      Chemistry Recent Labs  Lab 09/15/19 1228 09/16/19 0820 09/17/19 0251  NA 143 138 136  K 3.0* 3.8 3.2*  CL 106 100 96*  CO2 26 25 24   GLUCOSE 90 88 77  BUN 15 17 18   CREATININE 1.34* 1.57* 1.79*  CALCIUM 8.7* 8.6* 8.3*  PROT 6.2* 5.7* 5.7*  ALBUMIN 3.4* 3.2* 3.1*  AST 33 29 25  ALT 17 18 16   ALKPHOS 51 46 46  BILITOT 0.8 0.6 0.8  GFRNONAA 37* 31* 26*  GFRAA 43* 35* 30*  ANIONGAP 11 13 16*     Hematology Recent Labs  Lab 09/15/19 1228 09/16/19 0820 09/17/19 0251  WBC 11.1* 13.7*  13.0*  RBC 4.42 3.94 3.80*  HGB 13.7 12.0 11.6*  HCT 41.8 37.5 36.3  MCV 94.6 95.2 95.5  MCH 31.0 30.5 30.5  MCHC 32.8 32.0 32.0  RDW 15.6* 15.5 15.3  PLT 347 289 255    BNP Recent Labs  Lab 09/15/19 1229  BNP 2,581.2*     DDimer  Recent Labs  Lab 09/16/19 0820  DDIMER 1.06*     Radiology    DG Chest 2 View  Result Date: 09/15/2019 CLINICAL DATA:  Shortness of breath EXAM: CHEST - 2 VIEW COMPARISON:  09/12/2019 FINDINGS: Trace pleural effusions. Bibasilar atelectasis. Chronic interstitial prominence. No pneumothorax. Top normal heart size. No acute osseous abnormality. Diffuse calcification of the visualized aorta. IMPRESSION: Persistent trace pleural effusions and bibasilar atelectasis. Electronically Signed   By: Macy Mis M.D.   On: 09/15/2019 14:14   CT Head Wo Contrast  Result Date: 09/15/2019 CLINICAL DATA:  Focal neurological deficit, stroke suspected EXAM: CT HEAD WITHOUT CONTRAST TECHNIQUE: Contiguous axial images were  obtained from the base of the skull through the vertex without intravenous contrast. COMPARISON:  08/13/2016 head CT. FINDINGS: Brain: No acute infarct or intracranial hemorrhage. No mass lesion. No midline shift or extra-axial fluid collection. Mild parenchymal volume loss with ex vacuo dilatation is unchanged. Vascular: No hyperdense vessel. Bilateral carotid siphon and V4 segment atherosclerotic calcifications. Skull: Negative for fracture or focal lesion. Sinuses/Orbits: Normal orbits. Clear paranasal sinuses. No mastoid effusion. Other: None. IMPRESSION: No acute intracranial process. Mild cerebral atrophy, similar to prior exam and within normal limits for patient's age. Electronically Signed   By: Primitivo Gauze M.D.   On: 09/15/2019 14:09   ECHOCARDIOGRAM COMPLETE  Result Date: 09/16/2019    ECHOCARDIOGRAM REPORT   Patient Name:   [name] Date of Exam: 09/16/2019 Medical Rec #:  [id number]     Height:       64.0 in Accession #:    [id number]    Weight:       125.7 lb Date of Birth:  1935/05/08     BSA:          1.606 m Patient Age:    45 years      BP:           128/57 mmHg Patient Gender: F             HR:           80 bpm. Exam Location:  Inpatient Procedure: 2D Echo, Cardiac Doppler and Color Doppler Indications:    I50.33 Acute on chronic diastolic (congestive) heart failure  History:        Patient has prior history of Echocardiogram examinations, most                 recent 08/27/2019. COPD, Arrythmias:Atrial Fibrillation; Risk                 Factors:Hypertension and GERD.  Sonographer:    Jonelle Sidle Dance Referring Phys: [id number] [place]  1. Left ventricular ejection fraction, by estimation, is 50 to 55%. The left ventricle has low normal function. The left ventricle has no regional wall motion abnormalities. There is mild concentric left ventricular hypertrophy. Left ventricular diastolic function could not be evaluated.  2. Right ventricular systolic function is  normal. The right ventricular size is normal. Tricuspid regurgitation signal is inadequate for assessing PA pressure.  3. Left atrial size was mildly dilated.  4. The mitral valve is normal in structure.  Trivial mitral valve regurgitation. No evidence of mitral stenosis.  5. The aortic valve is tricuspid. Aortic valve regurgitation is mild. No aortic stenosis is present.  6. The inferior vena cava is dilated in size with <50% respiratory variability, suggesting right atrial pressure of 15 mmHg. FINDINGS  Left Ventricle: Left ventricular ejection fraction, by estimation, is 50 to 55%. The left ventricle has low normal function. The left ventricle has no regional wall motion abnormalities. The left ventricular internal cavity size was normal in size. There is mild concentric left ventricular hypertrophy. Left ventricular diastolic function could not be evaluated due to atrial fibrillation. Left ventricular diastolic function could not be evaluated. Right Ventricle: The right ventricular size is normal. No increase in right ventricular wall thickness. Right ventricular systolic function is normal. Tricuspid regurgitation signal is inadequate for assessing PA pressure. Left Atrium: Left atrial size was mildly dilated. Right Atrium: Right atrial size was normal in size. Pericardium: A small pericardial effusion is present. The pericardial effusion is circumferential. Mitral Valve: The mitral valve is normal in structure. Mild to moderate mitral annular calcification. Trivial mitral valve regurgitation. No evidence of mitral valve stenosis. Tricuspid Valve: The tricuspid valve is normal in structure. Tricuspid valve regurgitation is trivial. No evidence of tricuspid stenosis. Aortic Valve: The aortic valve is tricuspid. . There is mild thickening and mild calcification of the aortic valve. Aortic valve regurgitation is mild. Aortic regurgitation PHT measures 237 msec. No aortic stenosis is present. There is mild  thickening of  the aortic valve. There is mild calcification of the aortic valve. Pulmonic Valve: The pulmonic valve was grossly normal. Pulmonic valve regurgitation is mild. No evidence of pulmonic stenosis. Aorta: The aortic root and ascending aorta are structurally normal, with no evidence of dilitation. Venous: The inferior vena cava is dilated in size with less than 50% respiratory variability, suggesting right atrial pressure of 15 mmHg. IAS/Shunts: The atrial septum is grossly normal. Additional Comments: There is a small pleural effusion in both left and right lateral regions.  LEFT VENTRICLE PLAX 2D LVIDd:         3.54 cm LVIDs:         2.70 cm LV PW:         1.30 cm LV IVS:        1.50 cm LVOT diam:     1.80 cm LV SV:         32 LV SV Index:   20 LVOT Area:     2.54 cm  RIGHT VENTRICLE             IVC RV Basal diam:  2.70 cm     IVC diam: 2.30 cm RV S prime:     10.10 cm/s TAPSE (M-mode): 1.8 cm LEFT ATRIUM             Index       RIGHT ATRIUM           Index LA diam:        4.40 cm 2.74 cm/m  RA Area:     10.50 cm LA Vol (A2C):   39.1 ml 24.35 ml/m RA Volume:   21.10 ml  13.14 ml/m LA Vol (A4C):   32.3 ml 20.12 ml/m LA Biplane Vol: 36.4 ml 22.67 ml/m  AORTIC VALVE LVOT Vmax:   66.55 cm/s LVOT Vmean:  43.900 cm/s LVOT VTI:    0.126 m AI PHT:      237 msec  AORTA Ao Root diam: 3.00 cm  Ao Asc diam:  3.50 cm MITRAL VALVE MV Area (PHT): 2.48 cm    SHUNTS MV Decel Time: 306 msec    Systemic VTI:  0.13 m MV E velocity: 36.80 cm/s  Systemic Diam: 1.80 cm MV A velocity: 34.90 cm/s MV E/A ratio:  1.05 Buford Dresser MD Electronically signed by Buford Dresser MD Signature Date/Time: 09/16/2019/10:36:43 PM    Final    VAS [place] UPPER EXTREMITY VENOUS DUPLEX  Result Date: 09/16/2019 UPPER VENOUS STUDY  Indications: Pain, and Swelling Limitations: Pain. Comparison Study: No prior study Performing Technologist: Maudry Mayhew MHA, RDMS, RVT, RDCS  Examination Guidelines: A complete evaluation  includes B-mode imaging, spectral Doppler, color Doppler, and power Doppler as needed of all accessible portions of each vessel. Bilateral testing is considered an integral part of a complete examination. Limited examinations for reoccurring indications may be performed as noted.  Right Findings: +----------+------------+---------+-----------+----------+--------------+ RIGHT     CompressiblePhasicitySpontaneousProperties   Summary     +----------+------------+---------+-----------+----------+--------------+ IJV           Full       Yes       Yes                             +----------+------------+---------+-----------+----------+--------------+ Subclavian    Full       Yes       Yes                             +----------+------------+---------+-----------+----------+--------------+ Axillary      Full       Yes       Yes                             +----------+------------+---------+-----------+----------+--------------+ Brachial      Full       Yes       Yes                             +----------+------------+---------+-----------+----------+--------------+ Radial        Full                                                 +----------+------------+---------+-----------+----------+--------------+ Ulnar         Full                                                 +----------+------------+---------+-----------+----------+--------------+ Cephalic      Full                                                 +----------+------------+---------+-----------+----------+--------------+ Basilic                                             Not visualized +----------+------------+---------+-----------+----------+--------------+  Left Findings: +----------+------------+---------+-----------+----------+--------------+ LEFT  CompressiblePhasicitySpontaneousProperties   Summary     +----------+------------+---------+-----------+----------+--------------+  Subclavian                                          Not visualized +----------+------------+---------+-----------+----------+--------------+  Summary:  Right: No evidence of deep vein thrombosis in the upper extremity. No evidence of superficial vein thrombosis in the upper extremity.  *See table(s) above for measurements and observations.  Diagnosing physician: Harold Barban MD Electronically signed by Harold Barban MD on 09/16/2019 at 4:19:31 PM.    Final     Cardiac Studies   Echo 09/16/19: 1. Left ventricular ejection fraction, by estimation, is 50 to 55%. The  left ventricle has low normal function. The left ventricle has no regional  wall motion abnormalities. There is mild concentric left ventricular  hypertrophy. Left ventricular  diastolic function could not be evaluated.  2. Right ventricular systolic function is normal. The right ventricular  size is normal. Tricuspid regurgitation signal is inadequate for assessing  PA pressure.  3. Left atrial size was mildly dilated.  4. The mitral valve is normal in structure. Trivial mitral valve  regurgitation. No evidence of mitral stenosis.  5. The aortic valve is tricuspid. Aortic valve regurgitation is mild. No  aortic stenosis is present.  6. The inferior vena cava is dilated in size with <50% respiratory  variability, suggesting right atrial pressure of 15 mmHg.    LHC 08/26/19:   1st Diag lesion is 100% stenosed.  Balloon angioplasty was performed using a BALLOON SAPPHIRE 2.0X12.  Post intervention, there is a 50% residual stenosis.  Mid LAD lesion is 30% stenosed.  1. Total occlusion of the superior branch of the first diagonal with contrast staining, consistent with infarct vessel, treated with balloon angioplasty alone due to small vessel size and poor distal runoff 2. Calcified coronary arteries with no significant obstructive disease in the left main, left circumflex, LAD proper, or RCA 3. Minimally  elevated LVEDP  Plan: ICU care, add clopidogrel when nausea subsides, risk reduction measures as tolerated. Unclear whether this is a de novo plaque rupture versus an embolic event. Patient does have known paroxysmal atrial fibrillation and has refused anticoagulation.  Echocardiogram 08/27/19:  1. Left ventricular ejection fraction, by estimation, is 60 to 65%. The  left ventricle has normal function. Left ventricular endocardial border  not optimally defined to evaluate regional wall motion. There is mild  concentric left ventricular  hypertrophy. Left ventricular diastolic function could not be evaluated.  2. Right ventricular systolic function is normal. The right ventricular  size is normal.  3. Left atrial size was moderately dilated.  4. The mitral valve is normal in structure. No evidence of mitral valve  regurgitation. No evidence of mitral stenosis.  5. The aortic valve is tricuspid. Aortic valve regurgitation is mild.   Patient Profile     84 y.o. female with a hx of STEMI s/p PTCA of D1, CKD stage III, emphysema/COPD, PAF (patient refuses anticoagulation), and syncope who is being seen today for the evaluation of elevated troponin and shortness of breath.  Assessment & Plan    Elevated troponin Hx of NSTEMI with PTCA-D1 - suspect hs troponin elevation is due to demand ischemia - she denies chest pain - noted with chest pain at the time of her MI presentation - continue ASA and plavix, BB and crestor   Hypertensive urgency - nitro drip was  weaned off yesterday, but was restarted yesterday afternoon - amlodipine increased to 10 mg, continue lopressor 25 mg BID - I have asked nursing to give meds and turn off nitro - will watch pressure this morning - if still elevated, will consider adding imdur   Right forearm pain - RUE duplex was negative for DVT and superficial clot - do not suspect cellulitis or arterial compromise - continue with supportive care,  tylenol PRN   Shortness of breath LE edema - echo with preserved EF - elevated right atrial pressure, normal RV function - suspect a component of diastolic dysfunction - 40 mg IV lasix BID with incomplete I&Os - weight is steady at 125 lbs - has not changed since admission - low albumin is not helping her edema - she states she only urinated once yesterday - consider collecting UA today   Acute on chronic kidney insufficiency - CrCl 21.3 ml/min - sCr 1.79 (1.57) - K 3.2 - will replace gently - albumin 3.1   PAF - pt refuses anticoagulation, generally maintained sinus rhythm - continue BB      For questions or updates, please contact [facility] Please consult [email or web address] for contact info under        Signed, Ledora Bottcher, PA  09/17/2019, 8:00 AM

## 2019-09-18 ENCOUNTER — Telehealth: Payer: Self-pay | Admitting: Cardiology

## 2019-09-18 ENCOUNTER — Other Ambulatory Visit: Payer: Self-pay | Admitting: Physician Assistant

## 2019-09-18 ENCOUNTER — Telehealth: Payer: Self-pay | Admitting: Physician Assistant

## 2019-09-18 DIAGNOSIS — I5031 Acute diastolic (congestive) heart failure: Secondary | ICD-10-CM

## 2019-09-18 DIAGNOSIS — N183 Chronic kidney disease, stage 3 unspecified: Secondary | ICD-10-CM

## 2019-09-18 DIAGNOSIS — N179 Acute kidney failure, unspecified: Secondary | ICD-10-CM

## 2019-09-18 LAB — CBC
HCT: 35.3 % — ABNORMAL LOW (ref 36.0–46.0)
Hemoglobin: 11.5 g/dL — ABNORMAL LOW (ref 12.0–15.0)
MCH: 30.8 pg (ref 26.0–34.0)
MCHC: 32.6 g/dL (ref 30.0–36.0)
MCV: 94.6 fL (ref 80.0–100.0)
Platelets: 224 10*3/uL (ref 150–400)
RBC: 3.73 MIL/uL — ABNORMAL LOW (ref 3.87–5.11)
RDW: 14.8 % (ref 11.5–15.5)
WBC: 12.2 10*3/uL — ABNORMAL HIGH (ref 4.0–10.5)
nRBC: 0 % (ref 0.0–0.2)

## 2019-09-18 LAB — BASIC METABOLIC PANEL
Anion gap: 14 (ref 5–15)
BUN: 23 mg/dL (ref 8–23)
CO2: 24 mmol/L (ref 22–32)
Calcium: 8.6 mg/dL — ABNORMAL LOW (ref 8.9–10.3)
Chloride: 94 mmol/L — ABNORMAL LOW (ref 98–111)
Creatinine, Ser: 1.83 mg/dL — ABNORMAL HIGH (ref 0.44–1.00)
GFR calc Af Amer: 29 mL/min — ABNORMAL LOW (ref >60)
GFR calc non Af Amer: 25 mL/min — ABNORMAL LOW (ref >60)
Glucose, Bld: 95 mg/dL (ref 70–99)
Potassium: 4.2 mmol/L (ref 3.5–5.1)
Sodium: 132 mmol/L — ABNORMAL LOW (ref 135–145)

## 2019-09-18 LAB — MAGNESIUM: Magnesium: 1.7 mg/dL (ref 1.7–2.4)

## 2019-09-18 MED ORDER — DOCUSATE SODIUM 100 MG PO CAPS: 100.0000 mg | ORAL_CAPSULE | Freq: Two times a day (BID) | ORAL | 0 refills | Status: DC

## 2019-09-18 MED ORDER — ISOSORBIDE MONONITRATE ER 30 MG PO TB24: 30.0000 mg | ORAL_TABLET | Freq: Every day | ORAL | 0 refills | Status: DC

## 2019-09-18 MED ORDER — CALCIUM CARBONATE ANTACID 500 MG PO CHEW
1.0000 | CHEWABLE_TABLET | Freq: Three times a day (TID) | ORAL | Status: DC
  Administered 2019-09-18 – 2019-09-19 (×3): 200 mg via ORAL
  Filled 2019-09-18 (×3): qty 1

## 2019-09-18 MED ORDER — METOPROLOL TARTRATE 50 MG PO TABS
50.0000 mg | ORAL_TABLET | Freq: Two times a day (BID) | ORAL | Status: DC
  Administered 2019-09-18 – 2019-09-19 (×2): 50 mg via ORAL
  Filled 2019-09-18 (×2): qty 1

## 2019-09-18 MED ORDER — AMLODIPINE BESYLATE 10 MG PO TABS: 10.0000 mg | ORAL_TABLET | Freq: Every day | ORAL | 0 refills | Status: DC

## 2019-09-18 NOTE — Telephone Encounter (Signed)
Follow Up:    [name] is checking on status of  the orders that was sent on 09-11-19.i

## 2019-09-18 NOTE — Progress Notes (Signed)
PROGRESS NOTE  [name] QMV:[id number] DOB: 06-17-1935 DOA: 09/15/2019 PCP: Cassandria Anger, MD   LOS: 2 days   Brief Narrative / Interim history: 84 year old female with recent MI status post stenting 3 weeks ago, CKD stage III, hypertension, A. fib, B12 deficiency, COPD, depression came in with elevated blood pressure associated with shortness of breath.  She was admitted 6/22 for acute lateral STEMI and underwent PTCA of the first diagonal which is 100% stenosed.  Peak high-sensitivity troponin was 3863.  She was also hypoxic on discharge and was placed on home oxygen.  Subjective: Reports burping and acid reflux.  Also reports abdominal discomfort.  No nausea no vomiting.  No BM.  Not passing gas.  Assessment & Plan: Hypertensive emergency Initially was on nitro drip,  have resumed her home medication with amlodipine, metoprolol.   She is also on furosemide for diuresis, will stop it due to renal function. Cardiology consulted and meds adjusted.  On imdur   Coronary artery disease status post recent stenting mild troponin leak,  probably related to hypertension and doubt ACS. Continue Plavix, beta-blocker  AKI on Chronic kidney disease stage IIIb Baseline creatinine around 1.4, mild worsened.  Monitor for now. Likely from a combination of overdiuresis as well as patient's poor p.o. intake.  COPD with chronic bronchitis This appears stable, she has no wheezing.  Right arm pain She has good pulses, there is no erythema / cellulitis / swelling  GAD- chronic, stable, continue home medications  Acute on chronic hypoxic respiratory failure due to pulmonary edema in the setting of hypertensive emergency- diuresis  Paroxysmal A. fib- continue aspirin, beta-blocker, she is not anticoagulated  Hyperlipidemia- continue statin  Poor p.o. intake. Patient adamantly refusing hospital food. Offered her to brought food from home which she refuses as well. Monitor for  now. Unable to discharge patient who has worsening renal function and poor p.o. intake.  Scheduled Meds:  amLODipine  10 mg Oral Daily   aspirin  81 mg Oral Daily   calcium carbonate  1 tablet Oral TID   Chlorhexidine Gluconate Cloth  6 each Topical Daily   clopidogrel  75 mg Oral Q breakfast   docusate sodium  100 mg Oral BID   isosorbide mononitrate  30 mg Oral Daily   mouth rinse  15 mL Mouth Rinse BID   metoprolol tartrate  50 mg Oral BID   nystatin   Topical TID   pantoprazole  40 mg Oral Daily   rosuvastatin  20 mg Oral Daily   sodium chloride flush  3 mL Intravenous Q12H   sodium chloride flush  3 mL Intravenous Q12H   Continuous Infusions:  sodium chloride 10 mL/hr at 09/16/19 1717   sodium chloride     PRN Meds:.sodium chloride, acetaminophen **OR** acetaminophen, HYDROcodone-acetaminophen, ondansetron **OR** ondansetron (ZOFRAN) IV, sodium chloride flush, traMADol  Diet Orders (From admission, onward)    Start     Ordered   09/18/19 0750  Diet 2 gram sodium Room service appropriate? Yes; Fluid consistency: Thin  Diet effective now       Question Answer Comment  Room service appropriate? Yes   Fluid consistency: Thin      09/18/19 0749   09/18/19 0000  Diet - low sodium heart healthy        09/18/19 1247         DVT prophylaxis: SCDs Start: 09/16/19 0057    Code Status: Full Code  Family Communication: no family at bedside, patient refused  to give permission to call her sister.   Status is: inpatient  Dispo: The patient is from: Home              Anticipated d/c is to: Home              Anticipated d/c date is: 1 days              Patient currently is not medically stable to d/c.  Consultants:  Cardiology   Procedures:  None   Microbiology  None   Antimicrobials: None     Objective: Vitals:   09/18/19 0142 09/18/19 0453 09/18/19 0736 09/18/19 1345  BP: (!) 121/50 (!) 139/58 (!) 147/70 (!) 138/59  Pulse: 67 71 78 72  Resp:  16 16  19   Temp: 98.1 F (36.7 C) 98.2 F (36.8 C) 98.2 F (36.8 C) 98.2 F (36.8 C)  TempSrc:  Oral Oral Oral  SpO2: 98% 95% 94% 99%  Weight:  54.8 kg    Height:        Intake/Output Summary (Last 24 hours) at 09/18/2019 1852 Last data filed at 09/18/2019 1800 Gross per 24 hour  Intake 1391.42 ml  Output 650 ml  Net 741.42 ml   Filed Weights   09/16/19 0435 09/17/19 0500 09/18/19 0453  Weight: 57 kg 57 kg 54.8 kg    Examination:  General: Appear in mild distress, no Rash; Oral Mucosa Clear, moist. no Abnormal Neck Mass Or lumps, Conjunctiva normal  Cardiovascular: S1 and S2 Present, no Murmur, Respiratory: good respiratory effort, Bilateral Air entry present and Clear to Auscultation, no Crackles, no wheezes Abdomen: Bowel Sound present, Soft and no tenderness Extremities: no Pedal edema, no calf tenderness Neurology: alert and oriented to time, place, and person affect appropriate. no new focal deficit Gait not checked due to patient safety concerns    Data Reviewed: I have independently reviewed following labs and imaging studies   CBC: Recent Labs  Lab 09/12/19 2012 09/15/19 1228 09/16/19 0820 09/17/19 0251 09/18/19 0420  WBC 10.3 11.1* 13.7* 13.0* 12.2*  NEUTROABS 5.9 7.7 9.0*  --   --   HGB 13.5 13.7 12.0 11.6* 11.5*  HCT 42.9 41.8 37.5 36.3 35.3*  MCV 94.5 94.6 95.2 95.5 94.6  PLT 410* 347 289 255 503   Basic Metabolic Panel: Recent Labs  Lab 09/12/19 2012 09/15/19 1228 09/16/19 0820 09/17/19 0251 09/18/19 0420  NA 140 143 138 136 132*  K 3.5 3.0* 3.8 3.2* 4.2  CL 105 106 100 96* 94*  CO2 24 26 25 24 24   GLUCOSE 101* 90 88 77 95  BUN 18 15 17 18 23   CREATININE 1.54* 1.34* 1.57* 1.79* 1.83*  CALCIUM 8.8* 8.7* 8.6* 8.3* 8.6*  MG  --   --  1.7  --  1.7  PHOS  --   --  3.2  --   --    Liver Function Tests: Recent Labs  Lab 09/12/19 2012 09/15/19 1228 09/16/19 0820 09/17/19 0251  AST 27 33 29 25  ALT 15 17 18 16   ALKPHOS 50 51 46 46    BILITOT 1.0 0.8 0.6 0.8  PROT 6.6 6.2* 5.7* 5.7*  ALBUMIN 3.5 3.4* 3.2* 3.1*   Coagulation Profile: Recent Labs  Lab 09/15/19 1228  INR 1.0   HbA1C: No results for input(s): HGBA1C in the last 72 hours. CBG: No results for input(s): GLUCAP in the last 168 hours.  Recent Results (from the past 240 hour(s))  MRSA PCR Screening  Status: None   Collection Time: 09/16/19  4:30 AM   Specimen: Nasopharyngeal  Result Value Ref Range Status   MRSA by PCR NEGATIVE NEGATIVE Final    Comment:        The GeneXpert MRSA Assay (FDA approved for NASAL specimens only), is one component of a comprehensive MRSA colonization surveillance program. It is not intended to diagnose MRSA infection nor to guide or monitor treatment for MRSA infections. Performed at [facility], [place] [address] [place], [place] [address]   SARS Coronavirus 2 by RT PCR (hospital order, performed in [facility] hospital lab) Nasopharyngeal Nasopharyngeal Swab     Status: None   Collection Time: 09/16/19  7:11 AM   Specimen: Nasopharyngeal Swab  Result Value Ref Range Status   SARS Coronavirus 2 NEGATIVE NEGATIVE Final    Comment: (NOTE) SARS-CoV-2 target nucleic acids are NOT DETECTED.  The SARS-CoV-2 RNA is generally detectable in upper and lower respiratory specimens during the acute phase of infection. The lowest concentration of SARS-CoV-2 viral copies this assay can detect is 250 copies / mL. A negative result does not preclude SARS-CoV-2 infection and should not be used as the sole basis for treatment or other patient management decisions.  A negative result may occur with improper specimen collection / handling, submission of specimen other than nasopharyngeal swab, presence of viral mutation(s) within the areas targeted by this assay, and inadequate number of viral copies (<250 copies / mL). A negative result must be combined with clinical observations, patient history, and  epidemiological information.  Fact Sheet for Patients:   StrictlyIdeas.no  Fact Sheet for Healthcare Providers: BankingDealers.co.za  This test is not yet approved or  cleared by the [place] FDA and has been authorized for detection and/or diagnosis of SARS-CoV-2 by FDA under an Emergency Use Authorization (EUA).  This EUA will remain in effect (meaning this test can be used) for the duration of the COVID-19 declaration under Section 564(b)(1) of the Act, 21 U.S.C. section 360bbb-3(b)(1), unless the authorization is terminated or revoked sooner.  Performed at [facility], [place] [address] [place], [place] [address]      Radiology Studies: No results found.  Author:  Berle Mull, MD [facility] 09/18/2019  6:52 PM   To reach On-call, see care teams to locate the attending and reach out to them via [email or web address]. If 7PM-7AM, please contact night-coverage If you still have difficulty reaching the attending provider, please page the [facility] (Director on Call) for [facility] on amion for assistance.

## 2019-09-18 NOTE — Telephone Encounter (Signed)
Currently admitted.

## 2019-09-18 NOTE — Progress Notes (Signed)
Assumed care of pt at 1530. Agree with previous RN assessment. Pt resting in bed. Will continue to monitor.

## 2019-09-18 NOTE — Telephone Encounter (Signed)
Lmtcb.

## 2019-09-18 NOTE — Telephone Encounter (Signed)
    Attention TOC pool,  This patient will need a TOC phone call after discharge. They are being discharged likely today. Follow-up appointment has already been arranged with: 8/2 with Dr. Stanford Breed. This is outside the 14 day window but she would benefit from the phone call.  They are a patient of Kirk Ruths, MD.  Thank you! Charlie Pitter, PA-C

## 2019-09-18 NOTE — Progress Notes (Signed)
Instructions put on AVS about returning to office on Monday for BMET at [facility] Order placed. Elden Brucato PA-C

## 2019-09-18 NOTE — TOC Progression Note (Signed)
Transition of Care [facility]) - Progression Note    Patient Details  Name: TERRESSA EVOLA MRN: [id number] Date of Birth: [birth year]  Transition of Care [facility]) CM/SW Contact  Purcell Mouton, RN Phone Number: 09/18/2019, 3:29 PM  Clinical Narrative:    Pt is active with [facility]. Pt will discharge back with home with [facility].   Expected Discharge Plan: [place] Barriers to Discharge: No Barriers Identified  Expected Discharge Plan and Services Expected Discharge Plan: [place] arrangements for the past 2 months: Single Family Home Expected Discharge Date:  (unknown)                                     Social Determinants of Health (SDOH) Interventions    Readmission Risk Interventions Readmission Risk Prevention Plan 08/28/2019  Transportation Screening Complete  PCP or Specialist Appt within 5-7 Days Complete  Home Care Screening Complete  Medication Review (RN CM) Complete  Some recent data might be hidden

## 2019-09-18 NOTE — Progress Notes (Signed)
Report received from ongoing nurse. Agreed with nurse assessment of patient and will cont to monitor. 

## 2019-09-18 NOTE — Progress Notes (Addendum)
Progress Note  Patient Name: [name] Date of Encounter: 09/18/2019  Primary Cardiologist: Sherren Mocha, MD  Subjective   Upset about many facets of her stay - says she should not have eaten the tuna sandwich, does not like the noise in the hallway, wants to go home, does not want to have any more blood drawn. States she is feeling better without CP or SOB.  Inpatient Medications    Scheduled Meds: . amLODipine  10 mg Oral Daily  . aspirin  81 mg Oral Daily  . Chlorhexidine Gluconate Cloth  6 each Topical Daily  . clopidogrel  75 mg Oral Q breakfast  . docusate sodium  100 mg Oral BID  . isosorbide mononitrate  30 mg Oral Daily  . mouth rinse  15 mL Mouth Rinse BID  . metoprolol tartrate  37.5 mg Oral BID  . nystatin   Topical TID  . pantoprazole  40 mg Oral Daily  . rosuvastatin  20 mg Oral Daily  . sodium chloride flush  3 mL Intravenous Q12H  . sodium chloride flush  3 mL Intravenous Q12H   Continuous Infusions: . sodium chloride 10 mL/hr at 09/16/19 1717  . sodium chloride     PRN Meds: sodium chloride, acetaminophen **OR** acetaminophen, HYDROcodone-acetaminophen, ondansetron **OR** ondansetron (ZOFRAN) IV, sodium chloride flush, traMADol   Vital Signs    Vitals:   09/17/19 2057 09/18/19 0142 09/18/19 0453 09/18/19 0736  BP: 136/61 (!) 121/50 (!) 139/58 (!) 147/70  Pulse: 76 67 71 78  Resp: 16 16 16    Temp: 98.1 F (36.7 C) 98.1 F (36.7 C) 98.2 F (36.8 C) 98.2 F (36.8 C)  TempSrc: Oral  Oral Oral  SpO2: 95% 98% 95% 94%  Weight:   54.8 kg   Height:        Intake/Output Summary (Last 24 hours) at 09/18/2019 1058 Last data filed at 09/18/2019 1000 Gross per 24 hour  Intake 720 ml  Output 800 ml  Net -80 ml   Last 3 Weights 09/18/2019 09/17/2019 09/16/2019  Weight (lbs) 120 lb 13 oz 125 lb 10.6 oz 125 lb 10.6 oz  Weight (kg) 54.8 kg 57 kg 57 kg     Telemetry    NSR occasional PVCs - Personally Reviewed Physical Exam   GEN: No acute  distress.  HEENT: Normocephalic, atraumatic, sclera non-icteric. Neck: No JVD or bruits. Cardiac: RRR no murmurs, rubs, or gallops.  Radials/DP/PT 1+ and equal bilaterally.  Respiratory: Clear to auscultation bilaterally. Breathing is unlabored. GI: Soft, nontender, non-distended, BS +x 4. MS: no deformity. Extremities: No clubbing or cyanosis. No edema. Distal pedal pulses are 2+ and equal bilaterally. Ecchymosis of left forearm noted Neuro:  AAOx3. Follows commands. Psych:  Responds to questions appropriately with a normal affect.  Labs    High Sensitivity Troponin:   Recent Labs  Lab 08/26/19 1240 09/15/19 1228 09/15/19 1550 09/15/19 2000 09/16/19 0820  TROPONINIHS 3,863* 98* 135* 137* 97*      Cardiac EnzymesNo results for input(s): TROPONINI in the last 168 hours. No results for input(s): TROPIPOC in the last 168 hours.   Chemistry Recent Labs  Lab 09/15/19 1228 09/15/19 1228 09/16/19 0820 09/17/19 0251 09/18/19 0420  NA 143   < > 138 136 132*  K 3.0*   < > 3.8 3.2* 4.2  CL 106   < > 100 96* 94*  CO2 26   < > 25 24 24   GLUCOSE 90   < > 88 77  95  BUN 15   < > 17 18 23   CREATININE 1.34*   < > 1.57* 1.79* 1.83*  CALCIUM 8.7*   < > 8.6* 8.3* 8.6*  PROT 6.2*  --  5.7* 5.7*  --   ALBUMIN 3.4*  --  3.2* 3.1*  --   AST 33  --  29 25  --   ALT 17  --  18 16  --   ALKPHOS 51  --  46 46  --   BILITOT 0.8  --  0.6 0.8  --   GFRNONAA 37*   < > 31* 26* 25*  GFRAA 43*   < > 35* 30* 29*  ANIONGAP 11   < > 13 16* 14   < > = values in this interval not displayed.     Hematology Recent Labs  Lab 09/16/19 0820 09/17/19 0251 09/18/19 0420  WBC 13.7* 13.0* 12.2*  RBC 3.94 3.80* 3.73*  HGB 12.0 11.6* 11.5*  HCT 37.5 36.3 35.3*  MCV 95.2 95.5 94.6  MCH 30.5 30.5 30.8  MCHC 32.0 32.0 32.6  RDW 15.5 15.3 14.8  PLT 289 255 224    BNP Recent Labs  Lab 09/15/19 1229  BNP 2,581.2*     DDimer  Recent Labs  Lab 09/16/19 0820  DDIMER 1.06*     Radiology      ECHOCARDIOGRAM COMPLETE  Result Date: 09/16/2019    ECHOCARDIOGRAM REPORT   Patient Name:   [name] Date of Exam: 09/16/2019 Medical Rec #:  [id number]     Height:       64.0 in Accession #:    [id number]    Weight:       125.7 lb Date of Birth:  1935-09-23     BSA:          1.606 m Patient Age:    84 years      BP:           128/57 mmHg Patient Gender: F             HR:           80 bpm. Exam Location:  Inpatient Procedure: 2D Echo, Cardiac Doppler and Color Doppler Indications:    I50.33 Acute on chronic diastolic (congestive) heart failure  History:        Patient has prior history of Echocardiogram examinations, most                 recent 08/27/2019. COPD, Arrythmias:Atrial Fibrillation; Risk                 Factors:Hypertension and GERD.  Sonographer:    Jonelle Sidle Dance Referring Phys: [id number] [place]  1. Left ventricular ejection fraction, by estimation, is 50 to 55%. The left ventricle has low normal function. The left ventricle has no regional wall motion abnormalities. There is mild concentric left ventricular hypertrophy. Left ventricular diastolic function could not be evaluated.  2. Right ventricular systolic function is normal. The right ventricular size is normal. Tricuspid regurgitation signal is inadequate for assessing PA pressure.  3. Left atrial size was mildly dilated.  4. The mitral valve is normal in structure. Trivial mitral valve regurgitation. No evidence of mitral stenosis.  5. The aortic valve is tricuspid. Aortic valve regurgitation is mild. No aortic stenosis is present.  6. The inferior vena cava is dilated in size with <50% respiratory variability, suggesting right atrial pressure of 15 mmHg. FINDINGS  Left Ventricle:  Left ventricular ejection fraction, by estimation, is 50 to 55%. The left ventricle has low normal function. The left ventricle has no regional wall motion abnormalities. The left ventricular internal cavity size was normal in size. There is  mild concentric left ventricular hypertrophy. Left ventricular diastolic function could not be evaluated due to atrial fibrillation. Left ventricular diastolic function could not be evaluated. Right Ventricle: The right ventricular size is normal. No increase in right ventricular wall thickness. Right ventricular systolic function is normal. Tricuspid regurgitation signal is inadequate for assessing PA pressure. Left Atrium: Left atrial size was mildly dilated. Right Atrium: Right atrial size was normal in size. Pericardium: A small pericardial effusion is present. The pericardial effusion is circumferential. Mitral Valve: The mitral valve is normal in structure. Mild to moderate mitral annular calcification. Trivial mitral valve regurgitation. No evidence of mitral valve stenosis. Tricuspid Valve: The tricuspid valve is normal in structure. Tricuspid valve regurgitation is trivial. No evidence of tricuspid stenosis. Aortic Valve: The aortic valve is tricuspid. . There is mild thickening and mild calcification of the aortic valve. Aortic valve regurgitation is mild. Aortic regurgitation PHT measures 237 msec. No aortic stenosis is present. There is mild thickening of  the aortic valve. There is mild calcification of the aortic valve. Pulmonic Valve: The pulmonic valve was grossly normal. Pulmonic valve regurgitation is mild. No evidence of pulmonic stenosis. Aorta: The aortic root and ascending aorta are structurally normal, with no evidence of dilitation. Venous: The inferior vena cava is dilated in size with less than 50% respiratory variability, suggesting right atrial pressure of 15 mmHg. IAS/Shunts: The atrial septum is grossly normal. Additional Comments: There is a small pleural effusion in both left and right lateral regions.  LEFT VENTRICLE PLAX 2D LVIDd:         3.54 cm LVIDs:         2.70 cm LV PW:         1.30 cm LV IVS:        1.50 cm LVOT diam:     1.80 cm LV SV:         32 LV SV Index:   20 LVOT Area:      2.54 cm  RIGHT VENTRICLE             IVC RV Basal diam:  2.70 cm     IVC diam: 2.30 cm RV S prime:     10.10 cm/s TAPSE (M-mode): 1.8 cm LEFT ATRIUM             Index       RIGHT ATRIUM           Index LA diam:        4.40 cm 2.74 cm/m  RA Area:     10.50 cm LA Vol (A2C):   39.1 ml 24.35 ml/m RA Volume:   21.10 ml  13.14 ml/m LA Vol (A4C):   32.3 ml 20.12 ml/m LA Biplane Vol: 36.4 ml 22.67 ml/m  AORTIC VALVE LVOT Vmax:   66.55 cm/s LVOT Vmean:  43.900 cm/s LVOT VTI:    0.126 m AI PHT:      237 msec  AORTA Ao Root diam: 3.00 cm Ao Asc diam:  3.50 cm MITRAL VALVE MV Area (PHT): 2.48 cm    SHUNTS MV Decel Time: 306 msec    Systemic VTI:  0.13 m MV E velocity: 36.80 cm/s  Systemic Diam: 1.80 cm MV A velocity: 34.90 cm/s MV E/A ratio:  1.05  Buford Dresser MD Electronically signed by Buford Dresser MD Signature Date/Time: 09/16/2019/10:36:43 PM    Final    VAS [place] UPPER EXTREMITY VENOUS DUPLEX  Result Date: 09/16/2019 UPPER VENOUS STUDY  Indications: Pain, and Swelling Limitations: Pain. Comparison Study: No prior study Performing Technologist: Maudry Mayhew MHA, RDMS, RVT, RDCS  Examination Guidelines: A complete evaluation includes B-mode imaging, spectral Doppler, color Doppler, and power Doppler as needed of all accessible portions of each vessel. Bilateral testing is considered an integral part of a complete examination. Limited examinations for reoccurring indications may be performed as noted.  Right Findings: +----------+------------+---------+-----------+----------+--------------+ RIGHT     CompressiblePhasicitySpontaneousProperties   Summary     +----------+------------+---------+-----------+----------+--------------+ IJV           Full       Yes       Yes                             +----------+------------+---------+-----------+----------+--------------+ Subclavian    Full       Yes       Yes                              +----------+------------+---------+-----------+----------+--------------+ Axillary      Full       Yes       Yes                             +----------+------------+---------+-----------+----------+--------------+ Brachial      Full       Yes       Yes                             +----------+------------+---------+-----------+----------+--------------+ Radial        Full                                                 +----------+------------+---------+-----------+----------+--------------+ Ulnar         Full                                                 +----------+------------+---------+-----------+----------+--------------+ Cephalic      Full                                                 +----------+------------+---------+-----------+----------+--------------+ Basilic                                             Not visualized +----------+------------+---------+-----------+----------+--------------+  Left Findings: +----------+------------+---------+-----------+----------+--------------+ LEFT      CompressiblePhasicitySpontaneousProperties   Summary     +----------+------------+---------+-----------+----------+--------------+ Subclavian  Not visualized +----------+------------+---------+-----------+----------+--------------+  Summary:  Right: No evidence of deep vein thrombosis in the upper extremity. No evidence of superficial vein thrombosis in the upper extremity.  *See table(s) above for measurements and observations.  Diagnosing physician: Harold Barban MD Electronically signed by Harold Barban MD on 09/16/2019 at 4:19:31 PM.    Final     Cardiac Studies   Echo 09/16/19: 1. Left ventricular ejection fraction, by estimation, is 50 to 55%. The  left ventricle has low normal function. The left ventricle has no regional  wall motion abnormalities. There is mild concentric left ventricular  hypertrophy.  Left ventricular  diastolic function could not be evaluated.  2. Right ventricular systolic function is normal. The right ventricular  size is normal. Tricuspid regurgitation signal is inadequate for assessing  PA pressure.  3. Left atrial size was mildly dilated.  4. The mitral valve is normal in structure. Trivial mitral valve  regurgitation. No evidence of mitral stenosis.  5. The aortic valve is tricuspid. Aortic valve regurgitation is mild. No  aortic stenosis is present.  6. The inferior vena cava is dilated in size with <50% respiratory  variability, suggesting right atrial pressure of 15 mmHg.    LHC 08/26/19:   1st Diag lesion is 100% stenosed.  Balloon angioplasty was performed using a BALLOON SAPPHIRE 2.0X12.  Post intervention, there is a 50% residual stenosis.  Mid LAD lesion is 30% stenosed.  1. Total occlusion of the superior branch of the first diagonal with contrast staining, consistent with infarct vessel, treated with balloon angioplasty alone due to small vessel size and poor distal runoff 2. Calcified coronary arteries with no significant obstructive disease in the left main, left circumflex, LAD proper, or RCA 3. Minimally elevated LVEDP  Plan: ICU care, add clopidogrel when nausea subsides, risk reduction measures as tolerated. Unclear whether this is a de novo plaque rupture versus an embolic event. Patient does have known paroxysmal atrial fibrillation and has refused anticoagulation.  Echocardiogram 08/27/19:  1. Left ventricular ejection fraction, by estimation, is 60 to 65%. The  left ventricle has normal function. Left ventricular endocardial border  not optimally defined to evaluate regional wall motion. There is mild  concentric left ventricular  hypertrophy. Left ventricular diastolic function could not be evaluated.  2. Right ventricular systolic function is normal. The right ventricular  size is normal.  3. Left atrial size  was moderately dilated.  4. The mitral valve is normal in structure. No evidence of mitral valve  regurgitation. No evidence of mitral stenosis.  5. The aortic valve is tricuspid. Aortic valve regurgitation is mild.   Patient Profile     84 y.o. female with history of CAD with recent STEMI 08/26/19 s/p PTCA only of D1 (unclear if plaque rupture or embolic event), CKD stage III, emphysema/COPD (required home O2 last admission although patient hesitant to use), PAF (patient refuses anticoagulation), HTN, HLD, and syncope. LVEF was 60-65% by echo 08/27/19. She was seen in follow up on 09/14/19 and stated she did not want to take Plavix. She was readmitted to [facility] with shortness of breath, elevated blood pressure, DOE, orthopnea and leg swelling. She also has had right forearm pain without diagnostic abnormality.  Assessment & Plan    1. Shortness of breath with elevated troponin felt due to demand ischemia and probable acute on chronic diastolic CHF, also likely component of low albumin - hsTroponin elevation suspected due to demand ischemia (no chest pain this admission) - patient hesitant to take  DAPT but recommended to continue ASA, Plavix, BB and statin - volume status appears stable, - 5lb thus far, I/O's appear incomplete - has not received diuretic since 7/13, will review plans with MD (Cr is at 1.83 which is slightly higher than admissino, but within range of previous recent baseline of 1.7-2.0)  2. Hypertensive urgency - BP marginally elevated this AM on amlodipine 10mg , Imdur 30mg , metoprolol 37.5mg  BID.  Pulse remains 70s-80s. - Can consider increasing BB to 50mg  BID for simplicity's sake  3. Right forearm pain - RUE duplex was negative for DVT and superficial clot. Do not suspect cellulitis or arterial compromise. - improved with supportive care   4. CKD stage III -  Cr is at 1.83 which is slightly higher than admission, but within range of previous recent baseline of  1.7-2.0)  5. PAF - pt refuses anticoagulation, has maintained sinus rhythm on telemetry - continue BB  I was able to move up her previously existing follow-up appointment from 8/10 to 8/2 with Dr. Stanford Breed. Appt info placed on AVS. Will also request TOC call. I notified patient of this appointment change.  For questions or updates, please contact [place] Please consult [email or web address] for contact info under Cardiology/STEMI.  Signed, Charlie Pitter, PA-C 09/18/2019, 10:58 AM

## 2019-09-19 MED ORDER — ROSUVASTATIN CALCIUM 10 MG PO TABS: 10.0000 mg | ORAL_TABLET | Freq: Every day | ORAL | 0 refills | Status: DC

## 2019-09-19 NOTE — Telephone Encounter (Signed)
Spoke to [name] with [facility].He needs a order signed for O2. Advised patient currently in [facility] hospital.

## 2019-09-19 NOTE — Progress Notes (Signed)
Pt to be discharged to home this afternoon. Discharge instructions including all Medications and schedules for these Medications reviewed with the Pt and pt's Sister [name]. Understanding verbalized. Discharge packet with Pt at time of discharge

## 2019-09-19 NOTE — Progress Notes (Signed)
Occupational Therapy Treatment Patient Details Name: [name] MRN: [id number] DOB: 1935-10-29 Today's Date: 09/19/2019    History of present illness 84 year old female with recent MI status post stenting 3 weeks ago, CKD stage III, hypertension, A. fib, B12 deficiency, COPD, depression came in with elevated blood pressure associated with shortness of breath.  She was admitted 6/22 for acute lateral STEMI and underwent PTCA of the first diagonal which is 100% stenosed.  Peak high-sensitivity troponin was 3863.  She was also hypoxic on discharge and was placed on home oxygen   OT comments  Patient eager to go home. Patient demonstrates ability to ambulation in room, perform toilet transfer and perform dressing and toileting task with either supervision or min assist (for fasteners on shirt). Patient appears to be near her baseline. Patient using upper extremities to assist with sit to stands, using grab bar as needed and/or randomly holding onto furniture to steady herself. Patient does report "it feels like my heart is racing" when ambulating - HR 81 on monitor.    Follow Up Recommendations  No OT follow up;Supervision/Assistance - 24 hour    Equipment Recommendations  None recommended by OT    Recommendations for Other Services      Precautions / Restrictions Precautions Precautions: None Precaution Comments: monitor VS Restrictions Weight Bearing Restrictions: Yes       Mobility Bed Mobility                  Transfers Overall transfer level: Modified independent   Transfers: Sit to/from Stand Sit to Stand: Supervision         General transfer comment: Patient exhibited in room ambulation, toileting and sit to stands for ADLs with supervision only from therapist. patient safe with sit to stands - using upper extremities and grab bars as needed.    Balance Overall balance assessment: Mild deficits observed, not formally tested                                          ADL either performed or assessed with clinical judgement   ADL       Grooming: Supervision/safety (Patient stood at sink to wash hands.)           Upper Body Dressing : Minimal assistance (min assist for fasteners. Patient reports difficulty at baseline.)   Lower Body Dressing: Set up;Sit to/from stand;Supervision/safety (Patient donned pants and shoes without assistance but therapist gathered clothing.)   Toilet Transfer: Supervision/safety;Ambulation;Regular Toilet;Grab bars   Toileting- Clothing Manipulation and Hygiene: Supervision/safety;Sit to/from stand               Vision       Perception     Praxis      Cognition Arousal/Alertness: Awake/alert Behavior During Therapy: WFL for tasks assessed/performed Overall Cognitive Status: Within Functional Limits for tasks assessed                                          Exercises     Shoulder Instructions       General Comments      Pertinent Vitals/ Pain       Pain Assessment: No/denies pain  Home Living  Prior Functioning/Environment              Frequency  Min 2X/week        Progress Toward Goals  OT Goals(current goals can now be found in the care plan section)  Progress towards OT goals: Progressing toward goals  Acute Rehab OT Goals Patient Stated Goal: to go home OT Goal Formulation: With patient Time For Goal Achievement: 10/01/19 Potential to Achieve Goals: Good  Plan Discharge plan remains appropriate    Co-evaluation                 AM-PAC OT "6 Clicks" Daily Activity     Outcome Measure   Help from another person eating meals?: None Help from another person taking care of personal grooming?: None Help from another person toileting, which includes using toliet, bedpan, or urinal?: A Little Help from another person bathing (including washing, rinsing, drying)?: A  Little Help from another person to put on and taking off regular upper body clothing?: A Little Help from another person to put on and taking off regular lower body clothing?: A Little 6 Click Score: 20    End of Session    OT Visit Diagnosis: Other abnormalities of gait and mobility (R26.89);Pain   Activity Tolerance Patient tolerated treatment well   Patient Left in chair;with call bell/phone within reach   Nurse Communication Mobility status        Time: 4536-4680 OT Time Calculation (min): 16 min  Charges: OT General Charges $OT Visit: 1 Visit OT Treatments $Self Care/Home Management : 8-22 mins  Derl Barrow, OTR/L [place]  Office [phone] Pager: [place] 09/19/2019, 11:36 AM

## 2019-09-21 DIAGNOSIS — I2129 ST elevation (STEMI) myocardial infarction involving other sites: Secondary | ICD-10-CM | POA: Diagnosis not present

## 2019-09-21 DIAGNOSIS — I2510 Atherosclerotic heart disease of native coronary artery without angina pectoris: Secondary | ICD-10-CM | POA: Diagnosis not present

## 2019-09-21 DIAGNOSIS — E785 Hyperlipidemia, unspecified: Secondary | ICD-10-CM | POA: Diagnosis not present

## 2019-09-21 DIAGNOSIS — E559 Vitamin D deficiency, unspecified: Secondary | ICD-10-CM | POA: Diagnosis not present

## 2019-09-21 DIAGNOSIS — J439 Emphysema, unspecified: Secondary | ICD-10-CM | POA: Diagnosis not present

## 2019-09-21 DIAGNOSIS — E538 Deficiency of other specified B group vitamins: Secondary | ICD-10-CM | POA: Diagnosis not present

## 2019-09-21 DIAGNOSIS — I129 Hypertensive chronic kidney disease with stage 1 through stage 4 chronic kidney disease, or unspecified chronic kidney disease: Secondary | ICD-10-CM | POA: Diagnosis not present

## 2019-09-21 DIAGNOSIS — I700 Atherosclerosis of aorta: Secondary | ICD-10-CM | POA: Diagnosis not present

## 2019-09-21 DIAGNOSIS — N184 Chronic kidney disease, stage 4 (severe): Secondary | ICD-10-CM | POA: Diagnosis not present

## 2019-09-21 DIAGNOSIS — I351 Nonrheumatic aortic (valve) insufficiency: Secondary | ICD-10-CM | POA: Diagnosis not present

## 2019-09-21 DIAGNOSIS — I480 Paroxysmal atrial fibrillation: Secondary | ICD-10-CM | POA: Diagnosis not present

## 2019-09-21 NOTE — Discharge Summary (Signed)
[facility] Discharge Summary   Patient: [name] ZOX:[id number]  PCP: Cassandria Anger, MD  Date of admission: 09/15/2019   Date of discharge: 09/19/2019      Discharge Diagnoses:  Principal diagnosis Acute on chronic diastolic CHF  Active Problems:   Paroxysmal atrial fibrillation (HCC)   COPD with chronic bronchitis ([place])   GERD   Peptic ulcer   Generalized anxiety disorder   CAD S/P percutaneous coronary angioplasty   Hyperlipidemia with target LDL less than 70   Hypokalemia   CKD (chronic kidney disease), stage III   Elevated troponin   Hypertensive emergency   Acute diastolic CHF (congestive heart failure) ([place])   AKI (acute kidney injury) ([place])   Admitted From: Home Disposition:  Home   Recommendations for Outpatient Follow-up:  1. PCP: Follow-up with PCP in 1 week, follow-up with cardiology as recommended 2. Follow up LABS/TEST: BMP in 1 week   Follow-up Information    Lelon Perla, MD Follow up.   Specialty: Cardiology Why: [facility] - we moved up your follow-up visit from August 10th to Monday October 06, 2019 at 8:40 AM (Arrive by 8:25 AM). Contact information: [address] [place] [address] [phone]        [facility] Follow up.   Specialty: Cardiology Why: Please come to the [place] office on Monday, July 19 between 8am-4pm to get bloodwork drawn to recheck kidney function (BMET). This date is for labwork only. Contact information: [address] [place] [phone]       Plotnikov, Evie Lacks, MD. Schedule an appointment as soon as possible for a visit in 1 week(s).   Specialty: Internal Medicine Why: BMP in 1 week.  Contact information: [place] [address] [phone]              Diet recommendation: Cardiac diet  Activity: The patient is advised to gradually reintroduce usual activities, as tolerated  Discharge  Condition: stable  Code Status: Full code   History of present illness: As per the H and P dictated on admission, "[name] is a 84 y.o. female with medical history significant of recent ST elevation MI, CKD stage 3, HTN,  atrial fibrillation , B12 deficiency, COPD, depression, GERD, peptic ulcers    Presented with elevated blood pressure she states her home blood pressure was 190s over 100 she was endorsing back pain and overall shortness of breath and her legs started to swell up and bruise over her body.  admitted to the hospital on 08/26/2019 for acute lateral STEMI and underwent PTCA of the first diagonal which was100 %percent stenosed (reduced to 50%) no PCI was done due to the small caliber of the vessel Her creatinine on 08/27/2019 was 1.82. Peak high-sensitivity troponins 3863 Her LDL 154. Hemoglobin A1c 6.3. While she was ambulating with cardiac rehab her oxygen saturation dropped to 87% therefore she was discharged on supplemental oxygen, 3 L nasal cannula.  Since her discharge her  metoprolol was   Increased to 25 mg twice daily  Patient was stating she did not want to use Plavix."  Hospital Course:  Summary of her active problems in the hospital is as following.  Hypertensive emergency Initially was on nitro drip have resumed her home medication with amlodipine, metoprolol.   She is also on furosemide for diuresis, will stop it due to renal function. Cardiology consulted and meds adjusted.  On imdur   Coronary  artery disease status post recent stenting mild troponin leak,  probably related to hypertension and doubt ACS. Continue Plavix, beta-blocker No further work-up by cardiology.  AKI on Chronic kidney disease stage IIIb Baseline creatinine around 1.4, mild worsened.  Monitor for now. Likely from a combination of overdiuresis as well as patient's poor p.o. intake. Now renal function improving.  Patient oral intake is also improving.  Patient adamant to  go home.  Recommend outpatient follow-up with PCP with repeat BMP.  COPD with chronic bronchitis This appears stable, she has no wheezing.  Right arm pain She has good pulses, there is no erythema / cellulitis / swelling  GAD- chronic, stable, continue home medications  Acute on chronic hypoxic respiratory failure due to pulmonary edema in the setting of hypertensive emergency- Improvement diuresis  Paroxysmal A. fib- continue aspirin, beta-blocker, she is not anticoagulated Patient has refused anticoagulation per cardiology.  Hyperlipidemia- continue statin  Poor p.o. intake. Patient adamantly refusing hospital food. Likely led to worsening renal function. Patient finally agreed to eat and tolerating oral diet. Renal function improving as well.  Patient was seen by physical therapy, who recommended Home health, which was arranged. On the day of the discharge the patient's vitals were stable, and no other acute medical condition were reported by patient. the patient was felt safe to be discharge at Home with Home health.  Consultants: Cardiology  Procedures: Echocardiogram   Discharge Exam: General: Appear in no distress, no Rash; Oral Mucosa Clear, moist. Cardiovascular: S1 and S2 Present, aortic systolic  Murmur, Respiratory: normal respiratory effort, Bilateral Air entry present and no Crackles, no wheezes Abdomen: Bowel Sound present, Soft and no tenderness, no hernia Extremities: no Pedal edema, no calf tenderness Neurology: alert and oriented to time and place affect anxious.  Filed Weights   09/17/19 0500 09/18/19 0453 09/19/19 0500  Weight: 57 kg 54.8 kg 55.4 kg   Vitals:   09/19/19 0933 09/19/19 1118  BP: (!) 155/66   Pulse: 72 81  Resp:    Temp:    SpO2:      DISCHARGE MEDICATION: Allergies as of 09/19/2019      Reactions   Amoxicillin Other (See Comments)   "makes me crazy" Has patient had a PCN reaction causing immediate rash,  facial/tongue/throat swelling, SOB or lightheadedness with hypotension: No Has patient had a PCN reaction causing severe rash involving mucus membranes or skin necrosis: No Has patient had a PCN reaction that required hospitalization: No Has patient had a PCN reaction occurring within the last 10 years: No If all of the above answers are "NO", then may proceed with Cephalosporin use.   Codeine Nausea And Vomiting   Levaquin [levofloxacin In D5w] Nausea And Vomiting   Other Nausea And Vomiting   Pt does not want to take any pain meds      Medication List    TAKE these medications   amLODipine 10 MG tablet Commonly known as: NORVASC Take 1 tablet (10 mg total) by mouth daily. What changed:   medication strength  how much to take  how to take this  when to take this  additional instructions Notes to patient: Last Dose of this Medication was given on 09/19/2019 at 9:33 am   aspirin 81 MG chewable tablet Chew 1 tablet (81 mg total) by mouth daily. Notes to patient: Last Dose of this Medication was given on 09/19/2019 at 9:33 am   Biotin 1000 MCG tablet Take 1,000 mcg by mouth daily.   clonazePAM  0.5 MG tablet Commonly known as: KLONOPIN TAKE 1 TABLET BY MOUTH AT BEDTIME AS NEEDED FOR LEG CRAMPS What changed:   how much to take  how to take this  when to take this  reasons to take this  additional instructions   clopidogrel 75 MG tablet Commonly known as: PLAVIX Take 1 tablet (75 mg total) by mouth daily with breakfast. Notes to patient: Last Dose of this Medication was given on 09/19/2019 at 8:10 am    [employer] Vitamin D3 25 MCG (1000 UT) capsule Generic drug: Cholecalciferol Take 1,000 Units by mouth daily.   cyanocobalamin 1000 MCG tablet Take 1,000 mcg by mouth daily.   docusate sodium 100 MG capsule Commonly known as: COLACE Take 1 capsule (100 mg total) by mouth 2 (two) times daily. Notes to patient: Last Dose of this Medication was given on 09/19/2019 at  9:34 am   isosorbide mononitrate 30 MG 24 hr tablet Commonly known as: IMDUR Take 1 tablet (30 mg total) by mouth daily. Notes to patient: Last Dose of this Medication was given on 09/19/2019 at 9:34 am   ketoconazole 2 % cream Commonly known as: NIZORAL APPLY TOPICALLY TO THE AFFECTED AREA DAILY What changed: See the new instructions.   magic mouthwash w/lidocaine Soln Take 5 mLs by mouth 4 (four) times daily as needed for mouth pain.   metoprolol tartrate 25 MG tablet Commonly known as: LOPRESSOR Take 1 tablet (25 mg total) by mouth 2 (two) times daily. Notes to patient: Last Dose of this Medication was given on 09/19/2019 at 9:34 am   nitroGLYCERIN 0.4 MG SL tablet Commonly known as: Nitrostat Place 1 tablet (0.4 mg total) under the tongue every 5 (five) minutes as needed for chest pain.   pantoprazole 40 MG tablet Commonly known as: PROTONIX Take 1 tablet (40 mg total) by mouth daily. Notes to patient: Last Dose of this Medication was given on 09/19/2019 at 9:34 am   rosuvastatin 10 MG tablet Commonly known as: CRESTOR Take 1 tablet (10 mg total) by mouth daily. What changed:   medication strength  how much to take Notes to patient: Last Dose of this Medication was given on 09/19/2019 at 9:33 am   tobramycin 0.3 % ophthalmic solution Commonly known as: TOBREX Place 1 drop into both eyes every 6 (six) hours. Notes to patient: Please place eye drops every six hours   Trelegy Ellipta 100-62.5-25 MCG/INH Aepb Generic drug: Fluticasone-Umeclidin-Vilant Inhale 1 Inhaler into the lungs daily. What changed:   when to take this  reasons to take this      Allergies  Allergen Reactions  . Amoxicillin Other (See Comments)    "makes me crazy" Has patient had a PCN reaction causing immediate rash, facial/tongue/throat swelling, SOB or lightheadedness with hypotension: No Has patient had a PCN reaction causing severe rash involving mucus membranes or skin necrosis:  No Has patient had a PCN reaction that required hospitalization: No Has patient had a PCN reaction occurring within the last 10 years: No If all of the above answers are "NO", then may proceed with Cephalosporin use.   . Codeine Nausea And Vomiting  . Levaquin [Levofloxacin In D5w] Nausea And Vomiting  . Other Nausea And Vomiting    Pt does not want to take any pain meds   Discharge Instructions    Diet - low sodium heart healthy   Complete by: As directed    Increase activity slowly   Complete by: As directed  The results of significant diagnostics from this hospitalization (including imaging, microbiology, ancillary and laboratory) are listed below for reference.    Significant Diagnostic Studies: DG Chest 2 View  Result Date: 09/15/2019 CLINICAL DATA:  Shortness of breath EXAM: CHEST - 2 VIEW COMPARISON:  09/12/2019 FINDINGS: Trace pleural effusions. Bibasilar atelectasis. Chronic interstitial prominence. No pneumothorax. Top normal heart size. No acute osseous abnormality. Diffuse calcification of the visualized aorta. IMPRESSION: Persistent trace pleural effusions and bibasilar atelectasis. Electronically Signed   By: Macy Mis M.D.   On: 09/15/2019 14:14   DG Chest 2 View  Result Date: 09/12/2019 CLINICAL DATA:  Shortness of breath. EXAM: CHEST - 2 VIEW COMPARISON:  August 26, 2019 FINDINGS: The lungs are hyperinflated. Mild, diffuse chronic appearing increased interstitial lung markings are seen. Very mild areas of atelectasis are noted within the bilateral lung bases. Small bilateral pleural effusions are seen. No pneumothorax is identified. The cardiac silhouette is borderline in size. There is marked severity calcification of the aortic arch. The visualized skeletal structures are unremarkable. Radiopaque surgical clips are seen overlying the left upper quadrant. IMPRESSION: 1. Chronic appearing increased interstitial lung markings with mild bibasilar atelectasis. 2. Small  bilateral pleural effusions. Electronically Signed   By: Virgina Norfolk M.D.   On: 09/12/2019 20:47   CT Head Wo Contrast  Result Date: 09/15/2019 CLINICAL DATA:  Focal neurological deficit, stroke suspected EXAM: CT HEAD WITHOUT CONTRAST TECHNIQUE: Contiguous axial images were obtained from the base of the skull through the vertex without intravenous contrast. COMPARISON:  08/13/2016 head CT. FINDINGS: Brain: No acute infarct or intracranial hemorrhage. No mass lesion. No midline shift or extra-axial fluid collection. Mild parenchymal volume loss with ex vacuo dilatation is unchanged. Vascular: No hyperdense vessel. Bilateral carotid siphon and V4 segment atherosclerotic calcifications. Skull: Negative for fracture or focal lesion. Sinuses/Orbits: Normal orbits. Clear paranasal sinuses. No mastoid effusion. Other: None. IMPRESSION: No acute intracranial process. Mild cerebral atrophy, similar to prior exam and within normal limits for patient's age. Electronically Signed   By: Primitivo Gauze M.D.   On: 09/15/2019 14:09   CARDIAC CATHETERIZATION  Result Date: 08/27/2019  1st Diag lesion is 100% stenosed.  Balloon angioplasty was performed using a BALLOON SAPPHIRE 2.0X12.  Post intervention, there is a 50% residual stenosis.  Mid LAD lesion is 30% stenosed.  1.  Total occlusion of the superior branch of the first diagonal with contrast staining, consistent with infarct vessel, treated with balloon angioplasty alone due to small vessel size and poor distal runoff 2.  Calcified coronary arteries with no significant obstructive disease in the left main, left circumflex, LAD proper, or RCA 3.  Minimally elevated LVEDP Plan: ICU care, add clopidogrel when nausea subsides, risk reduction measures as tolerated.  Unclear whether this is a de novo plaque rupture versus an embolic event.  Patient does have known paroxysmal atrial fibrillation and has refused anticoagulation.   DG Chest Portable 1  View  Result Date: 08/26/2019 CLINICAL DATA:  Chest pain. EXAM: PORTABLE CHEST 1 VIEW COMPARISON:  12/19/2018, chest CT 01/01/2017 FINDINGS: Emphysema with chronic hyperinflation. Cardiomegaly with slight progression from prior exam. Unchanged mediastinal contours with aortic atherosclerosis. Suspected small bilateral pleural effusions. No focal airspace disease. There is biapical pleuroparenchymal scarring. No pneumothorax. Bones are diffusely under mineralized. No acute osseous abnormalities are seen. IMPRESSION: 1. Emphysema with chronic hyperinflation. 2. Cardiomegaly with slight progression from prior exam. Suspected small bilateral pleural effusions. Aortic Atherosclerosis (ICD10-I70.0) and Emphysema (ICD10-J43.9). Electronically Signed   By:  Keith Rake M.D.   On: 08/26/2019 20:04   ECHOCARDIOGRAM COMPLETE  Result Date: 09/16/2019    ECHOCARDIOGRAM REPORT   Patient Name:   TRACHELLE LOW Date of Exam: 09/16/2019 Medical Rec #:  [id number]     Height:       64.0 in Accession #:    [id number]    Weight:       125.7 lb Date of Birth:  1935-07-14     BSA:          1.606 m Patient Age:    24 years      BP:           128/57 mmHg Patient Gender: F             HR:           80 bpm. Exam Location:  Inpatient Procedure: 2D Echo, Cardiac Doppler and Color Doppler Indications:    I50.33 Acute on chronic diastolic (congestive) heart failure  History:        Patient has prior history of Echocardiogram examinations, most                 recent 08/27/2019. COPD, Arrythmias:Atrial Fibrillation; Risk                 Factors:Hypertension and GERD.  Sonographer:    Jonelle Sidle Dance Referring Phys: [id number] [place]  1. Left ventricular ejection fraction, by estimation, is 50 to 55%. The left ventricle has low normal function. The left ventricle has no regional wall motion abnormalities. There is mild concentric left ventricular hypertrophy. Left ventricular diastolic function could not be evaluated.  2.  Right ventricular systolic function is normal. The right ventricular size is normal. Tricuspid regurgitation signal is inadequate for assessing PA pressure.  3. Left atrial size was mildly dilated.  4. The mitral valve is normal in structure. Trivial mitral valve regurgitation. No evidence of mitral stenosis.  5. The aortic valve is tricuspid. Aortic valve regurgitation is mild. No aortic stenosis is present.  6. The inferior vena cava is dilated in size with <50% respiratory variability, suggesting right atrial pressure of 15 mmHg. FINDINGS  Left Ventricle: Left ventricular ejection fraction, by estimation, is 50 to 55%. The left ventricle has low normal function. The left ventricle has no regional wall motion abnormalities. The left ventricular internal cavity size was normal in size. There is mild concentric left ventricular hypertrophy. Left ventricular diastolic function could not be evaluated due to atrial fibrillation. Left ventricular diastolic function could not be evaluated. Right Ventricle: The right ventricular size is normal. No increase in right ventricular wall thickness. Right ventricular systolic function is normal. Tricuspid regurgitation signal is inadequate for assessing PA pressure. Left Atrium: Left atrial size was mildly dilated. Right Atrium: Right atrial size was normal in size. Pericardium: A small pericardial effusion is present. The pericardial effusion is circumferential. Mitral Valve: The mitral valve is normal in structure. Mild to moderate mitral annular calcification. Trivial mitral valve regurgitation. No evidence of mitral valve stenosis. Tricuspid Valve: The tricuspid valve is normal in structure. Tricuspid valve regurgitation is trivial. No evidence of tricuspid stenosis. Aortic Valve: The aortic valve is tricuspid. . There is mild thickening and mild calcification of the aortic valve. Aortic valve regurgitation is mild. Aortic regurgitation PHT measures 237 msec. No aortic  stenosis is present. There is mild thickening of  the aortic valve. There is mild calcification of the aortic valve. Pulmonic Valve: The pulmonic valve  was grossly normal. Pulmonic valve regurgitation is mild. No evidence of pulmonic stenosis. Aorta: The aortic root and ascending aorta are structurally normal, with no evidence of dilitation. Venous: The inferior vena cava is dilated in size with less than 50% respiratory variability, suggesting right atrial pressure of 15 mmHg. IAS/Shunts: The atrial septum is grossly normal. Additional Comments: There is a small pleural effusion in both left and right lateral regions.  LEFT VENTRICLE PLAX 2D LVIDd:         3.54 cm LVIDs:         2.70 cm LV PW:         1.30 cm LV IVS:        1.50 cm LVOT diam:     1.80 cm LV SV:         32 LV SV Index:   20 LVOT Area:     2.54 cm  RIGHT VENTRICLE             IVC RV Basal diam:  2.70 cm     IVC diam: 2.30 cm RV S prime:     10.10 cm/s TAPSE (M-mode): 1.8 cm LEFT ATRIUM             Index       RIGHT ATRIUM           Index LA diam:        4.40 cm 2.74 cm/m  RA Area:     10.50 cm LA Vol (A2C):   39.1 ml 24.35 ml/m RA Volume:   21.10 ml  13.14 ml/m LA Vol (A4C):   32.3 ml 20.12 ml/m LA Biplane Vol: 36.4 ml 22.67 ml/m  AORTIC VALVE LVOT Vmax:   66.55 cm/s LVOT Vmean:  43.900 cm/s LVOT VTI:    0.126 m AI PHT:      237 msec  AORTA Ao Root diam: 3.00 cm Ao Asc diam:  3.50 cm MITRAL VALVE MV Area (PHT): 2.48 cm    SHUNTS MV Decel Time: 306 msec    Systemic VTI:  0.13 m MV E velocity: 36.80 cm/s  Systemic Diam: 1.80 cm MV A velocity: 34.90 cm/s MV E/A ratio:  1.05 Buford Dresser MD Electronically signed by Buford Dresser MD Signature Date/Time: 09/16/2019/10:36:43 PM    Final    ECHOCARDIOGRAM COMPLETE  Result Date: 08/27/2019    ECHOCARDIOGRAM REPORT   Patient Name:   MELONEE GERSTEL Date of Exam: 08/27/2019 Medical Rec #:  [id number]     Height:       64.0 in Accession #:    [id number]    Weight:       124.3 lb Date of  Birth:  [birth year]     BSA:          1.599 m Patient Age:    31 years      BP:           158/68 mmHg Patient Gender: F             HR:           67 bpm. Exam Location:  Inpatient Procedure: 2D Echo and 3D Echo Indications:    Acute Cornonary Syndrome I24.9  History:        Patient has prior history of Echocardiogram examinations, most                 recent 09/17/2017. CAD and Acute MI, COPD, Arrythmias:Atrial  Fibrillation; Risk Factors:Former Smoker.  Sonographer:    Darlina Sicilian RDCS Referring Phys: [place]  1. Left ventricular ejection fraction, by estimation, is 60 to 65%. The left ventricle has normal function. Left ventricular endocardial border not optimally defined to evaluate regional wall motion. There is mild concentric left ventricular hypertrophy. Left ventricular diastolic function could not be evaluated.  2. Right ventricular systolic function is normal. The right ventricular size is normal.  3. Left atrial size was moderately dilated.  4. The mitral valve is normal in structure. No evidence of mitral valve regurgitation. No evidence of mitral stenosis.  5. The aortic valve is tricuspid. Aortic valve regurgitation is mild. FINDINGS  Left Ventricle: Left ventricular ejection fraction, by estimation, is 60 to 65%. The left ventricle has normal function. Left ventricular endocardial border not optimally defined to evaluate regional wall motion. The left ventricular internal cavity size was normal in size. There is mild concentric left ventricular hypertrophy. Left ventricular diastolic function could not be evaluated due to atrial fibrillation. Left ventricular diastolic function could not be evaluated. Right Ventricle: The right ventricular size is normal. No increase in right ventricular wall thickness. Right ventricular systolic function is normal. Left Atrium: Left atrial size was moderately dilated. Right Atrium: Right atrial size was normal in size.  Pericardium: There is no evidence of pericardial effusion. Mitral Valve: The mitral valve is normal in structure. No evidence of mitral valve regurgitation. No evidence of mitral valve stenosis. Tricuspid Valve: The tricuspid valve is grossly normal. Tricuspid valve regurgitation is not demonstrated. No evidence of tricuspid stenosis. Aortic Valve: The aortic valve is tricuspid. . There is mild thickening and mild calcification of the aortic valve. Aortic valve regurgitation is mild. There is mild thickening of the aortic valve. There is mild calcification of the aortic valve. Pulmonic Valve: The pulmonic valve was normal in structure. Pulmonic valve regurgitation is trivial. Aorta: The aortic root and ascending aorta are structurally normal, with no evidence of dilitation. IAS/Shunts: The atrial septum is grossly normal.  LEFT VENTRICLE PLAX 2D LVIDd:         3.90 cm     Diastology LVIDs:         2.80 cm     LV e' lateral:   4.68 cm/s LV PW:         1.30 cm     LV E/e' lateral: 14.2 LV IVS:        1.20 cm     LV e' medial:    4.43 cm/s LVOT diam:     2.00 cm     LV E/e' medial:  15.0 LV SV:         46 LV SV Index:   29 LVOT Area:     3.14 cm  LV Volumes (MOD) LV vol d, MOD A2C: 85.4 ml LV vol d, MOD A4C: 79.7 ml LV vol s, MOD A2C: 39.9 ml LV vol s, MOD A4C: 39.8 ml LV SV MOD A2C:     45.5 ml LV SV MOD A4C:     79.7 ml LV SV MOD BP:      46.0 ml RIGHT VENTRICLE RV S prime:     8.63 cm/s TAPSE (M-mode): 2.0 cm LEFT ATRIUM             Index       RIGHT ATRIUM           Index LA diam:        3.60 cm  2.25 cm/m  RA Area:     15.10 cm LA Vol (A2C):   52.8 ml 33.03 ml/m RA Volume:   36.00 ml  22.52 ml/m LA Vol (A4C):   86.9 ml 54.36 ml/m LA Biplane Vol: 69.0 ml 43.16 ml/m  AORTIC VALVE LVOT Vmax:   69.50 cm/s LVOT Vmean:  54.800 cm/s LVOT VTI:    0.148 m  AORTA Ao Root diam: 3.10 cm Ao Asc diam:  3.20 cm MITRAL VALVE MV Area (PHT): 4.68 cm    SHUNTS MV Decel Time: 162 msec    Systemic VTI:  0.15 m MV E velocity:  66.50 cm/s  Systemic Diam: 2.00 cm MV A velocity: 31.50 cm/s MV E/A ratio:  2.11 Mertie Moores MD Electronically signed by Mertie Moores MD Signature Date/Time: 08/27/2019/3:22:12 PM    Final    VAS [place] UPPER EXTREMITY VENOUS DUPLEX  Result Date: 09/16/2019 UPPER VENOUS STUDY  Indications: Pain, and Swelling Limitations: Pain. Comparison Study: No prior study Performing Technologist: Maudry Mayhew MHA, RDMS, RVT, RDCS  Examination Guidelines: A complete evaluation includes B-mode imaging, spectral Doppler, color Doppler, and power Doppler as needed of all accessible portions of each vessel. Bilateral testing is considered an integral part of a complete examination. Limited examinations for reoccurring indications may be performed as noted.  Right Findings: +----------+------------+---------+-----------+----------+--------------+ RIGHT     CompressiblePhasicitySpontaneousProperties   Summary     +----------+------------+---------+-----------+----------+--------------+ IJV           Full       Yes       Yes                             +----------+------------+---------+-----------+----------+--------------+ Subclavian    Full       Yes       Yes                             +----------+------------+---------+-----------+----------+--------------+ Axillary      Full       Yes       Yes                             +----------+------------+---------+-----------+----------+--------------+ Brachial      Full       Yes       Yes                             +----------+------------+---------+-----------+----------+--------------+ Radial        Full                                                 +----------+------------+---------+-----------+----------+--------------+ Ulnar         Full                                                 +----------+------------+---------+-----------+----------+--------------+ Cephalic      Full                                                  +----------+------------+---------+-----------+----------+--------------+  Basilic                                             Not visualized +----------+------------+---------+-----------+----------+--------------+  Left Findings: +----------+------------+---------+-----------+----------+--------------+ LEFT      CompressiblePhasicitySpontaneousProperties   Summary     +----------+------------+---------+-----------+----------+--------------+ Subclavian                                          Not visualized +----------+------------+---------+-----------+----------+--------------+  Summary:  Right: No evidence of deep vein thrombosis in the upper extremity. No evidence of superficial vein thrombosis in the upper extremity.  *See table(s) above for measurements and observations.  Diagnosing physician: Harold Barban MD Electronically signed by Harold Barban MD on 09/16/2019 at 4:19:31 PM.    Final     Microbiology: Recent Results (from the past 240 hour(s))  MRSA PCR Screening     Status: None   Collection Time: 09/16/19  4:30 AM   Specimen: Nasopharyngeal  Result Value Ref Range Status   MRSA by PCR NEGATIVE NEGATIVE Final    Comment:        The GeneXpert MRSA Assay (FDA approved for NASAL specimens only), is one component of a comprehensive MRSA colonization surveillance program. It is not intended to diagnose MRSA infection nor to guide or monitor treatment for MRSA infections. Performed at [facility], [place] [address], [place], [place] [address]   SARS Coronavirus 2 by RT PCR (hospital order, performed in [facility] hospital lab) Nasopharyngeal Nasopharyngeal Swab     Status: None   Collection Time: 09/16/19  7:11 AM   Specimen: Nasopharyngeal Swab  Result Value Ref Range Status   SARS Coronavirus 2 NEGATIVE NEGATIVE Final    Comment: (NOTE) SARS-CoV-2 target nucleic acids are NOT DETECTED.  The SARS-CoV-2 RNA is generally detectable in  upper and lower respiratory specimens during the acute phase of infection. The lowest concentration of SARS-CoV-2 viral copies this assay can detect is 250 copies / mL. A negative result does not preclude SARS-CoV-2 infection and should not be used as the sole basis for treatment or other patient management decisions.  A negative result may occur with improper specimen collection / handling, submission of specimen other than nasopharyngeal swab, presence of viral mutation(s) within the areas targeted by this assay, and inadequate number of viral copies (<250 copies / mL). A negative result must be combined with clinical observations, patient history, and epidemiological information.  Fact Sheet for Patients:   StrictlyIdeas.no  Fact Sheet for Healthcare Providers: BankingDealers.co.za  This test is not yet approved or  cleared by the [place] FDA and has been authorized for detection and/or diagnosis of SARS-CoV-2 by FDA under an Emergency Use Authorization (EUA).  This EUA will remain in effect (meaning this test can be used) for the duration of the COVID-19 declaration under Section 564(b)(1) of the Act, 21 U.S.C. section 360bbb-3(b)(1), unless the authorization is terminated or revoked sooner.  Performed at [facility], [place] [address], [place], [place] [address]      Labs: CBC: Recent Labs  Lab 09/15/19 1228 09/16/19 0820 09/17/19 0251 09/18/19 0420  WBC 11.1* 13.7* 13.0* 12.2*  NEUTROABS 7.7 9.0*  --   --   HGB 13.7 12.0 11.6* 11.5*  HCT 41.8 37.5 36.3 35.3*  MCV 94.6 95.2 95.5 94.6  PLT 347 289 255 183   Basic Metabolic Panel: Recent Labs  Lab 09/15/19 1228 09/16/19 0820 09/17/19 0251 09/18/19 0420  NA 143 138 136 132*  K 3.0* 3.8 3.2* 4.2  CL 106 100 96* 94*  CO2 26 25 24 24   GLUCOSE 90 88 77 95  BUN 15 17 18 23   CREATININE 1.34* 1.57* 1.79* 1.83*  CALCIUM 8.7* 8.6* 8.3* 8.6*  MG   --  1.7  --  1.7  PHOS  --  3.2  --   --    Liver Function Tests: Recent Labs  Lab 09/15/19 1228 09/16/19 0820 09/17/19 0251  AST 33 29 25  ALT 17 18 16   ALKPHOS 51 46 46  BILITOT 0.8 0.6 0.8  PROT 6.2* 5.7* 5.7*  ALBUMIN 3.4* 3.2* 3.1*   No results for input(s): LIPASE, AMYLASE in the last 168 hours. No results for input(s): AMMONIA in the last 168 hours. Cardiac Enzymes: No results for input(s): CKTOTAL, CKMB, CKMBINDEX, TROPONINI in the last 168 hours. BNP (last 3 results) Recent Labs    08/26/19 1240 09/15/19 1229  BNP 1,086.4* 2,581.2*   CBG: No results for input(s): GLUCAP in the last 168 hours.  Time spent: 35 minutes  Signed:  Berle Mull  [facility] 09/19/2019 4:50 PM

## 2019-09-22 ENCOUNTER — Telehealth: Payer: Self-pay | Admitting: Internal Medicine

## 2019-09-22 ENCOUNTER — Telehealth: Payer: Self-pay | Admitting: Cardiology

## 2019-09-22 NOTE — Telephone Encounter (Signed)
Unable to reach pt, left message for her to call back

## 2019-09-22 NOTE — Telephone Encounter (Addendum)
    Patient calling to request order for portable oxygen. Patient states she is unable to leave her home without portable oxygen. Patient also has questions about her medications. She is unsure what she should be taking daily

## 2019-09-22 NOTE — Telephone Encounter (Signed)
Pt's sister wanting to know if an order for portable oxygen can be placed

## 2019-09-22 NOTE — Telephone Encounter (Signed)
Routed to MD to review orders  Message has been left for patient to call back

## 2019-09-22 NOTE — Telephone Encounter (Signed)
[name] from [facility] calling requesting nursing orders of 2 times a week for 2 weeks, and 1 time a week for 2 weeks. She also would like to inform Dr. Stanford Breed she saw the patient yesterday and she had lost 5 lbs since Friday. She states the patient complained that her chest hurt and she could not catch her breath. She states the patient refused to go to the ED. She states she was supposed to get lab work done today, but did not have transportation. She also states that after taking her medication the patient felt fine and no longer had chest pain or SOB. She states her vital signs were fine and her BP was 150/70, before her medications. She states the patient's medications were also changed when she went to the hospital.

## 2019-09-22 NOTE — Telephone Encounter (Signed)
Spoke with [name], Aware of dr Jacalyn Lefevre recommendations.

## 2019-09-22 NOTE — Telephone Encounter (Signed)
TOC call #[id number]

## 2019-09-22 NOTE — Telephone Encounter (Signed)
[place] for home health order Kirk Ruths

## 2019-09-23 NOTE — Telephone Encounter (Signed)
TOC call attempt #2. LVM to call back.

## 2019-09-24 DIAGNOSIS — I480 Paroxysmal atrial fibrillation: Secondary | ICD-10-CM | POA: Diagnosis not present

## 2019-09-24 DIAGNOSIS — I129 Hypertensive chronic kidney disease with stage 1 through stage 4 chronic kidney disease, or unspecified chronic kidney disease: Secondary | ICD-10-CM | POA: Diagnosis not present

## 2019-09-24 DIAGNOSIS — N184 Chronic kidney disease, stage 4 (severe): Secondary | ICD-10-CM | POA: Diagnosis not present

## 2019-09-24 DIAGNOSIS — E559 Vitamin D deficiency, unspecified: Secondary | ICD-10-CM | POA: Diagnosis not present

## 2019-09-24 DIAGNOSIS — E785 Hyperlipidemia, unspecified: Secondary | ICD-10-CM | POA: Diagnosis not present

## 2019-09-24 DIAGNOSIS — J439 Emphysema, unspecified: Secondary | ICD-10-CM | POA: Diagnosis not present

## 2019-09-24 DIAGNOSIS — I2129 ST elevation (STEMI) myocardial infarction involving other sites: Secondary | ICD-10-CM | POA: Diagnosis not present

## 2019-09-24 DIAGNOSIS — I700 Atherosclerosis of aorta: Secondary | ICD-10-CM | POA: Diagnosis not present

## 2019-09-24 DIAGNOSIS — E538 Deficiency of other specified B group vitamins: Secondary | ICD-10-CM | POA: Diagnosis not present

## 2019-09-24 DIAGNOSIS — I2510 Atherosclerotic heart disease of native coronary artery without angina pectoris: Secondary | ICD-10-CM | POA: Diagnosis not present

## 2019-09-24 DIAGNOSIS — I351 Nonrheumatic aortic (valve) insufficiency: Secondary | ICD-10-CM | POA: Diagnosis not present

## 2019-09-24 NOTE — Telephone Encounter (Signed)
Patient was called for a TOC call-  She states that the appointment she has with Dr.Crenshaw is too early- I advised I didn't see anything else, but she refuses to see anyone but Dr.Crenshaw- will route to Nurse when she returns to office to see if she can fit her in somewhere after lunch- morning appointments never work for her.   Will route to nurse.

## 2019-09-24 NOTE — Telephone Encounter (Signed)
Ok w/me Thx 

## 2019-09-25 ENCOUNTER — Telehealth: Payer: Self-pay

## 2019-09-25 ENCOUNTER — Telehealth: Payer: Self-pay | Admitting: Internal Medicine

## 2019-09-25 ENCOUNTER — Telehealth: Payer: Self-pay | Admitting: Cardiology

## 2019-09-25 DIAGNOSIS — I351 Nonrheumatic aortic (valve) insufficiency: Secondary | ICD-10-CM | POA: Diagnosis not present

## 2019-09-25 DIAGNOSIS — I700 Atherosclerosis of aorta: Secondary | ICD-10-CM | POA: Diagnosis not present

## 2019-09-25 DIAGNOSIS — I129 Hypertensive chronic kidney disease with stage 1 through stage 4 chronic kidney disease, or unspecified chronic kidney disease: Secondary | ICD-10-CM | POA: Diagnosis not present

## 2019-09-25 DIAGNOSIS — I2510 Atherosclerotic heart disease of native coronary artery without angina pectoris: Secondary | ICD-10-CM | POA: Diagnosis not present

## 2019-09-25 DIAGNOSIS — J439 Emphysema, unspecified: Secondary | ICD-10-CM | POA: Diagnosis not present

## 2019-09-25 DIAGNOSIS — I2129 ST elevation (STEMI) myocardial infarction involving other sites: Secondary | ICD-10-CM | POA: Diagnosis not present

## 2019-09-25 DIAGNOSIS — E785 Hyperlipidemia, unspecified: Secondary | ICD-10-CM | POA: Diagnosis not present

## 2019-09-25 DIAGNOSIS — I480 Paroxysmal atrial fibrillation: Secondary | ICD-10-CM | POA: Diagnosis not present

## 2019-09-25 DIAGNOSIS — E538 Deficiency of other specified B group vitamins: Secondary | ICD-10-CM | POA: Diagnosis not present

## 2019-09-25 DIAGNOSIS — N184 Chronic kidney disease, stage 4 (severe): Secondary | ICD-10-CM | POA: Diagnosis not present

## 2019-09-25 DIAGNOSIS — E559 Vitamin D deficiency, unspecified: Secondary | ICD-10-CM | POA: Diagnosis not present

## 2019-09-25 NOTE — Telephone Encounter (Signed)
    Patient requesting prescription for [name] be prescribed and faxed to [facility]  Patient Assistance Phone [phone] Fax [phone]

## 2019-09-25 NOTE — Telephone Encounter (Signed)
New message   The patient is asking to CMA to call her back to discuss inhaler

## 2019-09-25 NOTE — Telephone Encounter (Signed)
Spoke with pt's sister and informed her to contact [facility] to send [place] an order for portable oxygen

## 2019-09-25 NOTE — Telephone Encounter (Signed)
New message:   Pt's sister is calling and would like a return call to discuss the pt's oxygen. Please advise.

## 2019-09-25 NOTE — Telephone Encounter (Signed)
Suanne Marker called from [facility] requesting the status of an order for oxygen sent 7/16. I informed her I did not see that one was received. She stated she would fax it again.

## 2019-09-25 NOTE — Telephone Encounter (Signed)
Donita, called patient to set up visit, patient refused.  She said someone else was coming to see her.  She refused to set up visit with [facility].

## 2019-09-26 MED ORDER — TRELEGY ELLIPTA 100-62.5-25 MCG/INH IN AEPB: 1.0000 | INHALATION_SPRAY | Freq: Every day | RESPIRATORY_TRACT | 3 refills | Status: DC

## 2019-09-26 NOTE — Telephone Encounter (Signed)
Refill placed and faxed

## 2019-09-26 NOTE — Telephone Encounter (Signed)
New message:   Pt's sister is calling back again in reference to some oxygen for the pt. She states she will be home for the next hour and then she will have to leave for about an hour. Please advise.

## 2019-09-29 DIAGNOSIS — J439 Emphysema, unspecified: Secondary | ICD-10-CM | POA: Diagnosis not present

## 2019-09-29 DIAGNOSIS — N184 Chronic kidney disease, stage 4 (severe): Secondary | ICD-10-CM | POA: Diagnosis not present

## 2019-09-29 DIAGNOSIS — E785 Hyperlipidemia, unspecified: Secondary | ICD-10-CM | POA: Diagnosis not present

## 2019-09-29 DIAGNOSIS — J449 Chronic obstructive pulmonary disease, unspecified: Secondary | ICD-10-CM | POA: Diagnosis not present

## 2019-09-29 DIAGNOSIS — I129 Hypertensive chronic kidney disease with stage 1 through stage 4 chronic kidney disease, or unspecified chronic kidney disease: Secondary | ICD-10-CM | POA: Diagnosis not present

## 2019-09-29 DIAGNOSIS — I2129 ST elevation (STEMI) myocardial infarction involving other sites: Secondary | ICD-10-CM | POA: Diagnosis not present

## 2019-09-29 DIAGNOSIS — I2510 Atherosclerotic heart disease of native coronary artery without angina pectoris: Secondary | ICD-10-CM | POA: Diagnosis not present

## 2019-09-29 DIAGNOSIS — J438 Other emphysema: Secondary | ICD-10-CM | POA: Diagnosis not present

## 2019-09-29 DIAGNOSIS — E538 Deficiency of other specified B group vitamins: Secondary | ICD-10-CM | POA: Diagnosis not present

## 2019-09-29 DIAGNOSIS — I700 Atherosclerosis of aorta: Secondary | ICD-10-CM | POA: Diagnosis not present

## 2019-09-29 DIAGNOSIS — E559 Vitamin D deficiency, unspecified: Secondary | ICD-10-CM | POA: Diagnosis not present

## 2019-09-29 DIAGNOSIS — I480 Paroxysmal atrial fibrillation: Secondary | ICD-10-CM | POA: Diagnosis not present

## 2019-09-29 DIAGNOSIS — I351 Nonrheumatic aortic (valve) insufficiency: Secondary | ICD-10-CM | POA: Diagnosis not present

## 2019-09-29 NOTE — Telephone Encounter (Signed)
F/u    The patient is asking the CMA to call her to call her to discuss her medication

## 2019-09-29 NOTE — Progress Notes (Deleted)
HPI: FU CAD and paroxysmal atrial fibrillation. A previous monitor revealed paroxysmal atrial fibrillation/flutter with a rapid ventricular response. Note she refuses anticoagulation. A CardioNet was performed in Feb 2012 secondary to palpitations and revealed sinus rhythm with PACs and rare PVCs.   Patient had acute lateral myocardial infarction June 2021.  Cardiac catheterization revealed an occluded first diagonal and 30% mid LAD.  Patient had balloon angioplasty due to small vessel size. Readmitted with hypertensive urgency and congestive heart failure July 2021.  She was diuresed but creatinine increased and Lasix was discontinued.  Her blood pressure medications were adjusted.  Follow-up echocardiogram July 2021 showed ejection fraction 50 to 55%, mild left atrial enlargement and mild aortic insufficiency.  Since I last saw her,  Current Outpatient Medications  Medication Sig Dispense Refill  . amLODipine (NORVASC) 10 MG tablet Take 1 tablet (10 mg total) by mouth daily. 30 tablet 0  . aspirin 81 MG chewable tablet Chew 1 tablet (81 mg total) by mouth daily.    . Biotin 1000 MCG tablet Take 1,000 mcg by mouth daily.    . Cholecalciferol ([employer] VITAMIN D3) 1000 UNITS capsule Take 1,000 Units by mouth daily.      . clonazePAM (KLONOPIN) 0.5 MG tablet TAKE 1 TABLET BY MOUTH AT BEDTIME AS NEEDED FOR LEG CRAMPS (Patient taking differently: Take 0.5 mg by mouth daily as needed (For leg cramps per patient). ) 90 tablet 0  . clopidogrel (PLAVIX) 75 MG tablet Take 1 tablet (75 mg total) by mouth daily with breakfast. 60 tablet 2  . cyanocobalamin 1000 MCG tablet Take 1,000 mcg by mouth daily.    Marland Kitchen docusate sodium (COLACE) 100 MG capsule Take 1 capsule (100 mg total) by mouth 2 (two) times daily. 10 capsule 0  . Fluticasone-Umeclidin-Vilant (TRELEGY ELLIPTA) 100-62.5-25 MCG/INH AEPB Inhale 1 Inhaler into the lungs daily. 3 each 3  . isosorbide mononitrate (IMDUR) 30 MG 24 hr tablet Take 1 tablet  (30 mg total) by mouth daily. 30 tablet 0  . ketoconazole (NIZORAL) 2 % cream APPLY TOPICALLY TO THE AFFECTED AREA DAILY (Patient taking differently: Apply 1 application topically daily as needed for irritation. ) 45 g 1  . magic mouthwash w/lidocaine SOLN Take 5 mLs by mouth 4 (four) times daily as needed for mouth pain. 240 mL 0  . metoprolol tartrate (LOPRESSOR) 25 MG tablet Take 1 tablet (25 mg total) by mouth 2 (two) times daily. 60 tablet 3  . nitroGLYCERIN (NITROSTAT) 0.4 MG SL tablet Place 1 tablet (0.4 mg total) under the tongue every 5 (five) minutes as needed for chest pain. 25 tablet 0  . pantoprazole (PROTONIX) 40 MG tablet Take 1 tablet (40 mg total) by mouth daily. 90 tablet 3  . rosuvastatin (CRESTOR) 10 MG tablet Take 1 tablet (10 mg total) by mouth daily. 30 tablet 0  . tobramycin (TOBREX) 0.3 % ophthalmic solution Place 1 drop into both eyes every 6 (six) hours.      No current facility-administered medications for this visit.     Past Medical History:  Diagnosis Date  . Anxiety   . Arthritis   . Atrial fibrillation ([place])   . B12 deficiency   . COPD (chronic obstructive pulmonary disease) ([place])   . Depression   . GERD (gastroesophageal reflux disease)   . Hypertension   . Osteoarthrosis, hand    both hands  . Peptic ulcer, unspecified site, unspecified as acute or chronic, without mention of hemorrhage, perforation, or  obstruction   . Pneumonia   . PONV (postoperative nausea and vomiting)   . Vitamin D deficiency disease     Past Surgical History:  Procedure Laterality Date  . BLADDER SURGERY     Bladder tack and intestines  . CORONARY/GRAFT ACUTE MI REVASCULARIZATION N/A 08/26/2019   Procedure: Coronary/Graft Acute MI Revascularization;  Surgeon: Sherren Mocha, MD;  Location: [facility];  Service: Cardiovascular;  Laterality: N/A;  . CYSTOCELE REPAIR    . CYSTOSCOPY WITH RETROGRADE PYELOGRAM, URETEROSCOPY AND STENT PLACEMENT Right 09/11/2016    Procedure: CYSTOSCOPY WITH RETROGRADE PYELOGRAM, URETEROSCOPY AND STENT REPLACEMENT;  Surgeon: Cleon Gustin, MD;  Location: [facility];  Service: Urology;  Laterality: Right;  . CYSTOSCOPY WITH STENT PLACEMENT Right 08/11/2016   Procedure: CYSTOSCOPY, URETERSCOPY,  RIGHT RETROGRADE WITH RIGHT STENT PLACEMENT;  Surgeon: Festus Aloe, MD;  Location: [facility];  Service: Urology;  Laterality: Right;  . SPLENECTOMY      Social History   Socioeconomic History  . Marital status: Divorced    Spouse name: Not on file  . Number of children: 2  . Years of education: Not on file  . Highest education level: Not on file  Occupational History  . Occupation: Airline pilot: [employer]  Tobacco Use  . Smoking status: Former Smoker    Packs/day: 1.00    Types: Cigarettes    Quit date: 06/11/08    Years since quitting: 11.5  . Smokeless tobacco: Never Used  . Tobacco comment: pt unsure how many years she smoked  Vaping Use  . Vaping Use: Never used  Substance and Sexual Activity  . Alcohol use: No  . Drug use: No  . Sexual activity: Not Currently  Other Topics Concern  . Not on file  Social History Narrative   Retired, works part time   Divorced   Former Smoker   1- Son alcoholic abusive   Daughter died in 06/12/06   Social Determinants of Health   Financial Resource Strain: High Risk  . Difficulty of Paying Living Expenses: Very hard  Food Insecurity: Food Insecurity Present  . Worried About Charity fundraiser in the Last Year: Often true  . Ran Out of Food in the Last Year: Often true  Transportation Needs: Unmet Transportation Needs  . Lack of Transportation (Medical): Yes  . Lack of Transportation (Non-Medical): Yes  Physical Activity: Unknown  . Days of Exercise per Week: Patient refused  . Minutes of Exercise per Session: Not on file  Stress: No Stress Concern Present  . Feeling of Stress : Not at all  Social Connections:   . Frequency of Communication with Friends and  Family:   . Frequency of Social Gatherings with Friends and Family:   . Attends Religious Services:   . Active Member of Clubs or Organizations:   . Attends Archivist Meetings:   Marland Kitchen Marital Status:   Intimate Partner Violence:   . Fear of Current or Ex-Partner:   . Emotionally Abused:   Marland Kitchen Physically Abused:   . Sexually Abused:     Family History  Problem Relation Age of Onset  . Heart disease Father   . Arthritis Other        Family history of  . Coronary artery disease Other        Family history of  . Hypertension Other        Family history of  . Diabetes Daughter  43 died    ROS: no fevers or chills, productive cough, hemoptysis, dysphasia, odynophagia, melena, hematochezia, dysuria, hematuria, rash, seizure activity, orthopnea, PND, pedal edema, claudication. Remaining systems are negative.  Physical Exam: Well-developed well-nourished in no acute distress.  Skin is warm and dry.  HEENT is normal.  Neck is supple.  Chest is clear to auscultation with normal expansion.  Cardiovascular exam is regular rate and rhythm.  Abdominal exam nontender or distended. No masses palpated. Extremities show no edema. neuro grossly intact  ECG- personally reviewed  A/P  1 coronary artery disease-patient denies recurrent chest pain.  Continue medical therapy with aspirin, Plavix and statin.  2 chronic diastolic congestive heart failure-patient's volume status improved compared to recent admission.  Diuretics were held due to worsening renal function.  We will continue to hold and follow.  3 history of paroxysmal atrial fibrillation-patient is in sinus rhythm on examination.  Continue beta-blocker.  She declines anticoagulation and understands the higher risk of CVA.  Note she was on flecainide previously but this has been discontinued now that coronary artery disease has been documented.  4 hypertension-blood pressure is controlled.  Continue present medical  regimen.  5 chronic stage III kidney disease-plan repeat bmet today.  6 hyperlipidemia-continue statin.  Kirk Ruths, MD

## 2019-09-30 ENCOUNTER — Telehealth: Payer: Self-pay | Admitting: Internal Medicine

## 2019-09-30 NOTE — Telephone Encounter (Signed)
New message:   Pt is wanting a call from the MA as soon as possible. Please advise.

## 2019-09-30 NOTE — Telephone Encounter (Signed)
New message   [facility] patient assistance  - [name]   Inhaler Trelegy - Ellipta  Need a physician signature

## 2019-10-01 ENCOUNTER — Ambulatory Visit: Payer: Medicare Other | Admitting: Cardiology

## 2019-10-01 NOTE — Telephone Encounter (Signed)
Please add this ID to the fax when RX is faxed over along with a cover sheet.  ID #: [id number]

## 2019-10-02 DIAGNOSIS — I480 Paroxysmal atrial fibrillation: Secondary | ICD-10-CM | POA: Diagnosis not present

## 2019-10-02 DIAGNOSIS — N184 Chronic kidney disease, stage 4 (severe): Secondary | ICD-10-CM | POA: Diagnosis not present

## 2019-10-02 DIAGNOSIS — E559 Vitamin D deficiency, unspecified: Secondary | ICD-10-CM | POA: Diagnosis not present

## 2019-10-02 DIAGNOSIS — E538 Deficiency of other specified B group vitamins: Secondary | ICD-10-CM | POA: Diagnosis not present

## 2019-10-02 DIAGNOSIS — J439 Emphysema, unspecified: Secondary | ICD-10-CM | POA: Diagnosis not present

## 2019-10-02 DIAGNOSIS — E785 Hyperlipidemia, unspecified: Secondary | ICD-10-CM | POA: Diagnosis not present

## 2019-10-02 DIAGNOSIS — I351 Nonrheumatic aortic (valve) insufficiency: Secondary | ICD-10-CM | POA: Diagnosis not present

## 2019-10-02 DIAGNOSIS — I2129 ST elevation (STEMI) myocardial infarction involving other sites: Secondary | ICD-10-CM | POA: Diagnosis not present

## 2019-10-02 DIAGNOSIS — I700 Atherosclerosis of aorta: Secondary | ICD-10-CM | POA: Diagnosis not present

## 2019-10-02 DIAGNOSIS — I129 Hypertensive chronic kidney disease with stage 1 through stage 4 chronic kidney disease, or unspecified chronic kidney disease: Secondary | ICD-10-CM | POA: Diagnosis not present

## 2019-10-02 DIAGNOSIS — I2510 Atherosclerotic heart disease of native coronary artery without angina pectoris: Secondary | ICD-10-CM | POA: Diagnosis not present

## 2019-10-02 NOTE — Telephone Encounter (Signed)
Pt states that she did not call and did not have any questions

## 2019-10-06 ENCOUNTER — Ambulatory Visit: Payer: Medicare Other | Admitting: Cardiology

## 2019-10-06 NOTE — Telephone Encounter (Signed)
Fax resent with information

## 2019-10-07 ENCOUNTER — Telehealth: Payer: Self-pay | Admitting: Internal Medicine

## 2019-10-07 DIAGNOSIS — J439 Emphysema, unspecified: Secondary | ICD-10-CM | POA: Diagnosis not present

## 2019-10-07 DIAGNOSIS — E785 Hyperlipidemia, unspecified: Secondary | ICD-10-CM | POA: Diagnosis not present

## 2019-10-07 DIAGNOSIS — I2510 Atherosclerotic heart disease of native coronary artery without angina pectoris: Secondary | ICD-10-CM | POA: Diagnosis not present

## 2019-10-07 DIAGNOSIS — N184 Chronic kidney disease, stage 4 (severe): Secondary | ICD-10-CM | POA: Diagnosis not present

## 2019-10-07 DIAGNOSIS — E559 Vitamin D deficiency, unspecified: Secondary | ICD-10-CM | POA: Diagnosis not present

## 2019-10-07 DIAGNOSIS — I700 Atherosclerosis of aorta: Secondary | ICD-10-CM | POA: Diagnosis not present

## 2019-10-07 DIAGNOSIS — I480 Paroxysmal atrial fibrillation: Secondary | ICD-10-CM | POA: Diagnosis not present

## 2019-10-07 DIAGNOSIS — I129 Hypertensive chronic kidney disease with stage 1 through stage 4 chronic kidney disease, or unspecified chronic kidney disease: Secondary | ICD-10-CM | POA: Diagnosis not present

## 2019-10-07 DIAGNOSIS — I351 Nonrheumatic aortic (valve) insufficiency: Secondary | ICD-10-CM | POA: Diagnosis not present

## 2019-10-07 DIAGNOSIS — I2129 ST elevation (STEMI) myocardial infarction involving other sites: Secondary | ICD-10-CM | POA: Diagnosis not present

## 2019-10-07 DIAGNOSIS — E538 Deficiency of other specified B group vitamins: Secondary | ICD-10-CM | POA: Diagnosis not present

## 2019-10-07 MED ORDER — TRELEGY ELLIPTA 100-62.5-25 MCG/INH IN AEPB: 1.0000 | INHALATION_SPRAY | Freq: Every day | RESPIRATORY_TRACT | 3 refills | Status: DC

## 2019-10-07 NOTE — Addendum Note (Signed)
Addended by: Darlys Gales on: 10/07/2019 01:58 PM   Modules accepted: Orders

## 2019-10-07 NOTE — Telephone Encounter (Signed)
Re-sent fax.

## 2019-10-07 NOTE — Telephone Encounter (Signed)
Fax [phone] Attention [phone]  Need Provider signature to process order for   [place] (TRELEGY ELLIPTA) 100-62.5-25 MCG/INH AEPB

## 2019-10-08 ENCOUNTER — Inpatient Hospital Stay: Payer: Medicare Other | Admitting: Internal Medicine

## 2019-10-09 DIAGNOSIS — N184 Chronic kidney disease, stage 4 (severe): Secondary | ICD-10-CM | POA: Diagnosis not present

## 2019-10-09 DIAGNOSIS — I2129 ST elevation (STEMI) myocardial infarction involving other sites: Secondary | ICD-10-CM | POA: Diagnosis not present

## 2019-10-09 DIAGNOSIS — E559 Vitamin D deficiency, unspecified: Secondary | ICD-10-CM | POA: Diagnosis not present

## 2019-10-09 DIAGNOSIS — I2510 Atherosclerotic heart disease of native coronary artery without angina pectoris: Secondary | ICD-10-CM | POA: Diagnosis not present

## 2019-10-09 DIAGNOSIS — E538 Deficiency of other specified B group vitamins: Secondary | ICD-10-CM | POA: Diagnosis not present

## 2019-10-09 DIAGNOSIS — I480 Paroxysmal atrial fibrillation: Secondary | ICD-10-CM | POA: Diagnosis not present

## 2019-10-09 DIAGNOSIS — E785 Hyperlipidemia, unspecified: Secondary | ICD-10-CM | POA: Diagnosis not present

## 2019-10-09 DIAGNOSIS — J439 Emphysema, unspecified: Secondary | ICD-10-CM | POA: Diagnosis not present

## 2019-10-09 DIAGNOSIS — I351 Nonrheumatic aortic (valve) insufficiency: Secondary | ICD-10-CM | POA: Diagnosis not present

## 2019-10-09 DIAGNOSIS — I700 Atherosclerosis of aorta: Secondary | ICD-10-CM | POA: Diagnosis not present

## 2019-10-09 DIAGNOSIS — I129 Hypertensive chronic kidney disease with stage 1 through stage 4 chronic kidney disease, or unspecified chronic kidney disease: Secondary | ICD-10-CM | POA: Diagnosis not present

## 2019-10-10 DIAGNOSIS — H353221 Exudative age-related macular degeneration, left eye, with active choroidal neovascularization: Secondary | ICD-10-CM | POA: Diagnosis not present

## 2019-10-10 DIAGNOSIS — H35371 Puckering of macula, right eye: Secondary | ICD-10-CM | POA: Diagnosis not present

## 2019-10-10 DIAGNOSIS — H353114 Nonexudative age-related macular degeneration, right eye, advanced atrophic with subfoveal involvement: Secondary | ICD-10-CM | POA: Diagnosis not present

## 2019-10-10 DIAGNOSIS — H43812 Vitreous degeneration, left eye: Secondary | ICD-10-CM | POA: Diagnosis not present

## 2019-10-13 ENCOUNTER — Ambulatory Visit (INDEPENDENT_AMBULATORY_CARE_PROVIDER_SITE_OTHER): Payer: Medicare Other | Admitting: Internal Medicine

## 2019-10-13 ENCOUNTER — Other Ambulatory Visit: Payer: Self-pay

## 2019-10-13 ENCOUNTER — Encounter: Payer: Self-pay | Admitting: Internal Medicine

## 2019-10-13 DIAGNOSIS — E559 Vitamin D deficiency, unspecified: Secondary | ICD-10-CM

## 2019-10-13 DIAGNOSIS — I161 Hypertensive emergency: Secondary | ICD-10-CM | POA: Diagnosis not present

## 2019-10-13 DIAGNOSIS — K219 Gastro-esophageal reflux disease without esophagitis: Secondary | ICD-10-CM

## 2019-10-13 DIAGNOSIS — E538 Deficiency of other specified B group vitamins: Secondary | ICD-10-CM

## 2019-10-13 DIAGNOSIS — I480 Paroxysmal atrial fibrillation: Secondary | ICD-10-CM

## 2019-10-13 MED ORDER — TRELEGY ELLIPTA 100-62.5-25 MCG/INH IN AEPB: 1.0000 | INHALATION_SPRAY | Freq: Every day | RESPIRATORY_TRACT | 3 refills | Status: DC

## 2019-10-13 NOTE — Assessment & Plan Note (Signed)
On Vit D 

## 2019-10-13 NOTE — Assessment & Plan Note (Signed)
Protonix.  ?

## 2019-10-13 NOTE — Assessment & Plan Note (Signed)
On Lopressor, Flecainide, ASA

## 2019-10-13 NOTE — Progress Notes (Signed)
Subjective:  Patient ID: [name], female    DOB: Aug 14, 1935  Age: 84 y.o. MRN: [id number]  CC: No chief complaint on file.   HPI [name] presents for PAF, COPD, HTN, CRI f/u.   "Patient: [name] PRF:[id number]  PCP: Cassandria Anger, MD  Date of admission: 09/15/2019             Date of discharge: 09/19/2019      Discharge Diagnoses:  Principal diagnosis Acute on chronic diastolic CHF  Active Problems:   Paroxysmal atrial fibrillation (HCC)   COPD with chronic bronchitis ([place])   GERD   Peptic ulcer   Generalized anxiety disorder   CAD S/P percutaneous coronary angioplasty   Hyperlipidemia with target LDL less than 70   Hypokalemia   CKD (chronic kidney disease), stage III   Elevated troponin   Hypertensive emergency   Acute diastolic CHF (congestive heart failure) ([place])   AKI (acute kidney injury) ([place])   Admitted From: Home Disposition:  Home   Recommendations for Outpatient Follow-up:  1. PCP: Follow-up with PCP in 1 week, follow-up with cardiology as recommended 2. Follow up LABS/TEST: BMP in 1 week       Follow-up Information        Lelon Perla, MD Follow up.   Specialty: Cardiology Why: [facility] - we moved up your follow-up visit from August 10th to Monday October 06, 2019 at 8:40 AM (Arrive by 8:25 AM). Contact information: [address] [place] [address] [phone]             [facility] Follow up.   Specialty: Cardiology Why: Please come to the [place] office on Monday, July 19 between 8am-4pm to get bloodwork drawn to recheck kidney function (BMET). This date is for labwork only. Contact information: [address] [place] [phone]            Manpreet Strey, Evie Lacks, MD. Schedule an appointment as soon as possible for a visit in 1 week(s).   Specialty: Internal Medicine Why: BMP in 1 week.  Contact information: [place] [address] [phone]               Diet recommendation: Cardiac diet  Activity: The patient is advised to gradually reintroduce usual activities, as tolerated  Discharge Condition: stable  Code Status: Full code   History of present illness: As per the H and P dictated on admission, "[name] a 44 y.o.femalewith medical history significant ofrecent ST elevation MI, CKD stage 3, HTN,atrial fibrillation,B12 deficiency, COPD, depression, GERD, peptic ulcers   Presented withelevated blood pressure she states her home blood pressure was 190s over 100 she was endorsing back pain and overall shortness of breath and her legs started to swell up and bruise over her body.  admitted to the hospital on 08/26/2019 for acute lateral STEMI and underwent PTCA of the first diagonal which was100 %percent stenosed (reduced to 50%) no PCI was done due to the small caliber of the vessel Her creatinine on 08/27/2019 was 1.82. Peak high-sensitivity troponins 3863 Her LDL 154. Hemoglobin A1c 6.3. While she was ambulating with cardiac rehab her oxygen saturation dropped to 87% therefore she was discharged on supplemental oxygen, 3 L nasal cannula.  Since her discharge hermetoprololwasIncreasedto 25 mg twice daily Patient was stating she did not want to use Plavix."  Hospital Course:  Summary of her active problems in the  hospital is as following.  Hypertensive emergency Initially wason nitro drip have resumed her home medication with amlodipine, metoprolol.  She is also on furosemide for diuresis, will stop it due to renal function. Cardiology consulted and meds adjusted.  On imdur   Coronary artery disease status post recent stenting mild troponin leak,  probably related to hypertension and doubt ACS. Continue Plavix, beta-blocker No further work-up by cardiology.  AKI on Chronic kidney disease stage IIIb Baseline creatinine  around 1.4, mild worsened. Monitor for now. Likely from a combination of overdiuresis as well as patient's poor p.o. intake. Now renal function improving.  Patient oral intake is also improving.  Patient adamant to go home.  Recommend outpatient follow-up with PCP with repeat BMP.  COPD with chronic bronchitis This appears stable, she has no wheezing.  Right arm pain She has good pulses, there is no erythema / cellulitis / swelling  GAD- chronic, stable, continue home medications  Acute on chronic hypoxic respiratory failure due to pulmonary edema in the setting of hypertensive emergency- Improvement diuresis  Paroxysmal A. fib- continue aspirin, beta-blocker, she is not anticoagulated Patient has refused anticoagulation per cardiology.  Hyperlipidemia- continue statin  Poor p.o. intake. Patient adamantly refusing hospital food. Likely led to worsening renal function. Patient finally agreed to eat and tolerating oral diet. Renal function improving as well.  Patient was seen by physical therapy, who recommended Home health, which was arranged. On the day of the discharge the patient's vitals were stable, and no other acute medical condition were reported by patient. the patient was felt safe to be discharge at Home with Home health."     Outpatient Medications Prior to Visit  Medication Sig Dispense Refill  . amLODipine (NORVASC) 10 MG tablet Take 1 tablet (10 mg total) by mouth daily. 30 tablet 0  . aspirin 81 MG chewable tablet Chew 1 tablet (81 mg total) by mouth daily.    . Biotin 1000 MCG tablet Take 1,000 mcg by mouth daily.    . Cholecalciferol ([employer] VITAMIN D3) 1000 UNITS capsule Take 1,000 Units by mouth daily.      . clonazePAM (KLONOPIN) 0.5 MG tablet TAKE 1 TABLET BY MOUTH AT BEDTIME AS NEEDED FOR LEG CRAMPS (Patient taking differently: Take 0.5 mg by mouth daily as needed (For leg cramps per patient). ) 90 tablet 0  . clopidogrel (PLAVIX) 75 MG tablet  Take 1 tablet (75 mg total) by mouth daily with breakfast. 60 tablet 2  . cyanocobalamin 1000 MCG tablet Take 1,000 mcg by mouth daily.    Marland Kitchen docusate sodium (COLACE) 100 MG capsule Take 1 capsule (100 mg total) by mouth 2 (two) times daily. 10 capsule 0  . Fluticasone-Umeclidin-Vilant (TRELEGY ELLIPTA) 100-62.5-25 MCG/INH AEPB Inhale 1 Inhaler into the lungs daily. 3 each 3  . isosorbide mononitrate (IMDUR) 30 MG 24 hr tablet Take 1 tablet (30 mg total) by mouth daily. 30 tablet 0  . ketoconazole (NIZORAL) 2 % cream APPLY TOPICALLY TO THE AFFECTED AREA DAILY (Patient taking differently: Apply 1 application topically daily as needed for irritation. ) 45 g 1  . magic mouthwash w/lidocaine SOLN Take 5 mLs by mouth 4 (four) times daily as needed for mouth pain. 240 mL 0  . metoprolol tartrate (LOPRESSOR) 25 MG tablet Take 1 tablet (25 mg total) by mouth 2 (two) times daily. 60 tablet 3  . nitroGLYCERIN (NITROSTAT) 0.4 MG SL tablet Place 1 tablet (0.4 mg total) under the tongue every 5 (five) minutes as needed  for chest pain. 25 tablet 0  . pantoprazole (PROTONIX) 40 MG tablet Take 1 tablet (40 mg total) by mouth daily. 90 tablet 3  . rosuvastatin (CRESTOR) 10 MG tablet Take 1 tablet (10 mg total) by mouth daily. 30 tablet 0  . tobramycin (TOBREX) 0.3 % ophthalmic solution Place 1 drop into both eyes every 6 (six) hours.      No facility-administered medications prior to visit.    ROS: Review of Systems  Constitutional: Positive for unexpected weight change. Negative for activity change, appetite change, chills and fatigue.  HENT: Negative for congestion, mouth sores and sinus pressure.   Eyes: Negative for visual disturbance.  Respiratory: Positive for shortness of breath. Negative for cough and chest tightness.   Gastrointestinal: Negative for abdominal pain and nausea.  Genitourinary: Negative for difficulty urinating, frequency and vaginal pain.  Musculoskeletal: Negative for back pain and  gait problem.  Skin: Negative for pallor and rash.  Neurological: Negative for dizziness, tremors, weakness, numbness and headaches.  Psychiatric/Behavioral: Negative for confusion and sleep disturbance. The patient is nervous/anxious.     Objective:  BP 120/70 (BP Location: Left Arm, Patient Position: Sitting, Cuff Size: Large)   Pulse (!) 119   Temp 98.1 F (36.7 C) (Oral)   Ht 5\' 4"  (1.626 m)   Wt 120 lb (54.4 kg)   SpO2 96%   BMI 20.60 kg/m   BP Readings from Last 3 Encounters:  10/13/19 120/70  09/19/19 (!) 155/66  09/12/19 (!) 190/105    Wt Readings from Last 3 Encounters:  10/13/19 120 lb (54.4 kg)  09/19/19 122 lb 2.2 oz (55.4 kg)  09/12/19 132 lb 4.4 oz (60 kg)    Physical Exam Constitutional:      General: She is not in acute distress.    Appearance: She is well-developed.  HENT:     Head: Normocephalic.     Right Ear: External ear normal.     Left Ear: External ear normal.     Nose: Nose normal.  Eyes:     General:        Right eye: No discharge.        Left eye: No discharge.     Conjunctiva/sclera: Conjunctivae normal.     Pupils: Pupils are equal, round, and reactive to light.  Neck:     Thyroid: No thyromegaly.     Vascular: No JVD.     Trachea: No tracheal deviation.  Cardiovascular:     Rate and Rhythm: Normal rate and regular rhythm.     Heart sounds: Normal heart sounds.  Pulmonary:     Effort: No respiratory distress.     Breath sounds: No stridor. No wheezing.  Abdominal:     General: Bowel sounds are normal. There is no distension.     Palpations: Abdomen is soft. There is no mass.     Tenderness: There is no abdominal tenderness. There is no guarding or rebound.  Musculoskeletal:        General: No tenderness.     Cervical back: Normal range of motion and neck supple.  Lymphadenopathy:     Cervical: No cervical adenopathy.  Skin:    Findings: No erythema or rash.  Neurological:     Cranial Nerves: No cranial nerve deficit.      Motor: No abnormal muscle tone.     Coordination: Coordination normal.     Deep Tendon Reflexes: Reflexes normal.  Psychiatric:        Behavior: Behavior normal.  Thought Content: Thought content normal.        Judgment: Judgment normal.     Lab Results  Component Value Date   WBC 12.2 (H) 09/18/2019   HGB 11.5 (L) 09/18/2019   HCT 35.3 (L) 09/18/2019   PLT 224 09/18/2019   GLUCOSE 95 09/18/2019   CHOL 239 (H) 08/26/2019   TRIG 155 (H) 08/26/2019   HDL 54 08/26/2019   LDLDIRECT 144.3 01/31/2011   LDLCALC 154 (H) 08/26/2019   ALT 16 09/17/2019   AST 25 09/17/2019   NA 132 (L) 09/18/2019   K 4.2 09/18/2019   CL 94 (L) 09/18/2019   CREATININE 1.83 (H) 09/18/2019   BUN 23 09/18/2019   CO2 24 09/18/2019   TSH 3.830 09/16/2019   INR 1.0 09/15/2019   HGBA1C 6.3 (H) 08/26/2019    DG Chest 2 View  Result Date: 09/15/2019 CLINICAL DATA:  Shortness of breath EXAM: CHEST - 2 VIEW COMPARISON:  09/12/2019 FINDINGS: Trace pleural effusions. Bibasilar atelectasis. Chronic interstitial prominence. No pneumothorax. Top normal heart size. No acute osseous abnormality. Diffuse calcification of the visualized aorta. IMPRESSION: Persistent trace pleural effusions and bibasilar atelectasis. Electronically Signed   By: Macy Mis M.D.   On: 09/15/2019 14:14   CT Head Wo Contrast  Result Date: 09/15/2019 CLINICAL DATA:  Focal neurological deficit, stroke suspected EXAM: CT HEAD WITHOUT CONTRAST TECHNIQUE: Contiguous axial images were obtained from the base of the skull through the vertex without intravenous contrast. COMPARISON:  08/13/2016 head CT. FINDINGS: Brain: No acute infarct or intracranial hemorrhage. No mass lesion. No midline shift or extra-axial fluid collection. Mild parenchymal volume loss with ex vacuo dilatation is unchanged. Vascular: No hyperdense vessel. Bilateral carotid siphon and V4 segment atherosclerotic calcifications. Skull: Negative for fracture or focal lesion.  Sinuses/Orbits: Normal orbits. Clear paranasal sinuses. No mastoid effusion. Other: None. IMPRESSION: No acute intracranial process. Mild cerebral atrophy, similar to prior exam and within normal limits for patient's age. Electronically Signed   By: Primitivo Gauze M.D.   On: 09/15/2019 14:09   ECHOCARDIOGRAM COMPLETE  Result Date: 09/16/2019    ECHOCARDIOGRAM REPORT   Patient Name:   [name] Date of Exam: 09/16/2019 Medical Rec #:  [id number]     Height:       64.0 in Accession #:    [id number]    Weight:       125.7 lb Date of Birth:  [birth year]     BSA:          1.606 m Patient Age:    65 years      BP:           128/57 mmHg Patient Gender: F             HR:           80 bpm. Exam Location:  Inpatient Procedure: 2D Echo, Cardiac Doppler and Color Doppler Indications:    I50.33 Acute on chronic diastolic (congestive) heart failure  History:        Patient has prior history of Echocardiogram examinations, most                 recent 08/27/2019. COPD, Arrythmias:Atrial Fibrillation; Risk                 Factors:Hypertension and GERD.  Sonographer:    Jonelle Sidle Dance Referring Phys: [id number] [place]  1. Left ventricular ejection fraction, by estimation, is 50 to 55%. The left ventricle has low normal  function. The left ventricle has no regional wall motion abnormalities. There is mild concentric left ventricular hypertrophy. Left ventricular diastolic function could not be evaluated.  2. Right ventricular systolic function is normal. The right ventricular size is normal. Tricuspid regurgitation signal is inadequate for assessing PA pressure.  3. Left atrial size was mildly dilated.  4. The mitral valve is normal in structure. Trivial mitral valve regurgitation. No evidence of mitral stenosis.  5. The aortic valve is tricuspid. Aortic valve regurgitation is mild. No aortic stenosis is present.  6. The inferior vena cava is dilated in size with <50% respiratory variability, suggesting  right atrial pressure of 15 mmHg. FINDINGS  Left Ventricle: Left ventricular ejection fraction, by estimation, is 50 to 55%. The left ventricle has low normal function. The left ventricle has no regional wall motion abnormalities. The left ventricular internal cavity size was normal in size. There is mild concentric left ventricular hypertrophy. Left ventricular diastolic function could not be evaluated due to atrial fibrillation. Left ventricular diastolic function could not be evaluated. Right Ventricle: The right ventricular size is normal. No increase in right ventricular wall thickness. Right ventricular systolic function is normal. Tricuspid regurgitation signal is inadequate for assessing PA pressure. Left Atrium: Left atrial size was mildly dilated. Right Atrium: Right atrial size was normal in size. Pericardium: A small pericardial effusion is present. The pericardial effusion is circumferential. Mitral Valve: The mitral valve is normal in structure. Mild to moderate mitral annular calcification. Trivial mitral valve regurgitation. No evidence of mitral valve stenosis. Tricuspid Valve: The tricuspid valve is normal in structure. Tricuspid valve regurgitation is trivial. No evidence of tricuspid stenosis. Aortic Valve: The aortic valve is tricuspid. . There is mild thickening and mild calcification of the aortic valve. Aortic valve regurgitation is mild. Aortic regurgitation PHT measures 237 msec. No aortic stenosis is present. There is mild thickening of  the aortic valve. There is mild calcification of the aortic valve. Pulmonic Valve: The pulmonic valve was grossly normal. Pulmonic valve regurgitation is mild. No evidence of pulmonic stenosis. Aorta: The aortic root and ascending aorta are structurally normal, with no evidence of dilitation. Venous: The inferior vena cava is dilated in size with less than 50% respiratory variability, suggesting right atrial pressure of 15 mmHg. IAS/Shunts: The atrial  septum is grossly normal. Additional Comments: There is a small pleural effusion in both left and right lateral regions.  LEFT VENTRICLE PLAX 2D LVIDd:         3.54 cm LVIDs:         2.70 cm LV PW:         1.30 cm LV IVS:        1.50 cm LVOT diam:     1.80 cm LV SV:         32 LV SV Index:   20 LVOT Area:     2.54 cm  RIGHT VENTRICLE             IVC RV Basal diam:  2.70 cm     IVC diam: 2.30 cm RV S prime:     10.10 cm/s TAPSE (M-mode): 1.8 cm LEFT ATRIUM             Index       RIGHT ATRIUM           Index LA diam:        4.40 cm 2.74 cm/m  RA Area:     10.50 cm LA Vol (A2C):  39.1 ml 24.35 ml/m RA Volume:   21.10 ml  13.14 ml/m LA Vol (A4C):   32.3 ml 20.12 ml/m LA Biplane Vol: 36.4 ml 22.67 ml/m  AORTIC VALVE LVOT Vmax:   66.55 cm/s LVOT Vmean:  43.900 cm/s LVOT VTI:    0.126 m AI PHT:      237 msec  AORTA Ao Root diam: 3.00 cm Ao Asc diam:  3.50 cm MITRAL VALVE MV Area (PHT): 2.48 cm    SHUNTS MV Decel Time: 306 msec    Systemic VTI:  0.13 m MV E velocity: 36.80 cm/s  Systemic Diam: 1.80 cm MV A velocity: 34.90 cm/s MV E/A ratio:  1.05 Buford Dresser MD Electronically signed by Buford Dresser MD Signature Date/Time: 09/16/2019/10:36:43 PM    Final    VAS [place] UPPER EXTREMITY VENOUS DUPLEX  Result Date: 09/16/2019 UPPER VENOUS STUDY  Indications: Pain, and Swelling Limitations: Pain. Comparison Study: No prior study Performing Technologist: Maudry Mayhew MHA, RDMS, RVT, RDCS  Examination Guidelines: A complete evaluation includes B-mode imaging, spectral Doppler, color Doppler, and power Doppler as needed of all accessible portions of each vessel. Bilateral testing is considered an integral part of a complete examination. Limited examinations for reoccurring indications may be performed as noted.  Right Findings: +----------+------------+---------+-----------+----------+--------------+ RIGHT     CompressiblePhasicitySpontaneousProperties   Summary      +----------+------------+---------+-----------+----------+--------------+ IJV           Full       Yes       Yes                             +----------+------------+---------+-----------+----------+--------------+ Subclavian    Full       Yes       Yes                             +----------+------------+---------+-----------+----------+--------------+ Axillary      Full       Yes       Yes                             +----------+------------+---------+-----------+----------+--------------+ Brachial      Full       Yes       Yes                             +----------+------------+---------+-----------+----------+--------------+ Radial        Full                                                 +----------+------------+---------+-----------+----------+--------------+ Ulnar         Full                                                 +----------+------------+---------+-----------+----------+--------------+ Cephalic      Full                                                 +----------+------------+---------+-----------+----------+--------------+  Basilic                                             Not visualized +----------+------------+---------+-----------+----------+--------------+  Left Findings: +----------+------------+---------+-----------+----------+--------------+ LEFT      CompressiblePhasicitySpontaneousProperties   Summary     +----------+------------+---------+-----------+----------+--------------+ Subclavian                                          Not visualized +----------+------------+---------+-----------+----------+--------------+  Summary:  Right: No evidence of deep vein thrombosis in the upper extremity. No evidence of superficial vein thrombosis in the upper extremity.  *See table(s) above for measurements and observations.  Diagnosing physician: Harold Barban MD Electronically signed by Harold Barban MD on 09/16/2019 at  4:19:31 PM.    Final     Assessment & Plan:    Walker Kehr, MD

## 2019-10-13 NOTE — Assessment & Plan Note (Signed)
On Lopressor, Amlodipine

## 2019-10-13 NOTE — Assessment & Plan Note (Signed)
On B12 

## 2019-10-14 ENCOUNTER — Ambulatory Visit: Payer: Medicare Other | Admitting: General Practice

## 2019-10-14 DIAGNOSIS — I2129 ST elevation (STEMI) myocardial infarction involving other sites: Secondary | ICD-10-CM | POA: Diagnosis not present

## 2019-10-14 DIAGNOSIS — I700 Atherosclerosis of aorta: Secondary | ICD-10-CM | POA: Diagnosis not present

## 2019-10-14 DIAGNOSIS — J439 Emphysema, unspecified: Secondary | ICD-10-CM | POA: Diagnosis not present

## 2019-10-14 DIAGNOSIS — I351 Nonrheumatic aortic (valve) insufficiency: Secondary | ICD-10-CM | POA: Diagnosis not present

## 2019-10-14 DIAGNOSIS — E559 Vitamin D deficiency, unspecified: Secondary | ICD-10-CM | POA: Diagnosis not present

## 2019-10-14 DIAGNOSIS — I129 Hypertensive chronic kidney disease with stage 1 through stage 4 chronic kidney disease, or unspecified chronic kidney disease: Secondary | ICD-10-CM | POA: Diagnosis not present

## 2019-10-14 DIAGNOSIS — E538 Deficiency of other specified B group vitamins: Secondary | ICD-10-CM | POA: Diagnosis not present

## 2019-10-14 DIAGNOSIS — E785 Hyperlipidemia, unspecified: Secondary | ICD-10-CM | POA: Diagnosis not present

## 2019-10-14 DIAGNOSIS — I2510 Atherosclerotic heart disease of native coronary artery without angina pectoris: Secondary | ICD-10-CM | POA: Diagnosis not present

## 2019-10-14 DIAGNOSIS — I480 Paroxysmal atrial fibrillation: Secondary | ICD-10-CM | POA: Diagnosis not present

## 2019-10-14 DIAGNOSIS — N184 Chronic kidney disease, stage 4 (severe): Secondary | ICD-10-CM | POA: Diagnosis not present

## 2019-10-15 DIAGNOSIS — H6123 Impacted cerumen, bilateral: Secondary | ICD-10-CM | POA: Diagnosis not present

## 2019-10-16 DIAGNOSIS — I700 Atherosclerosis of aorta: Secondary | ICD-10-CM | POA: Diagnosis not present

## 2019-10-16 DIAGNOSIS — E785 Hyperlipidemia, unspecified: Secondary | ICD-10-CM | POA: Diagnosis not present

## 2019-10-16 DIAGNOSIS — E559 Vitamin D deficiency, unspecified: Secondary | ICD-10-CM | POA: Diagnosis not present

## 2019-10-16 DIAGNOSIS — I2510 Atherosclerotic heart disease of native coronary artery without angina pectoris: Secondary | ICD-10-CM | POA: Diagnosis not present

## 2019-10-16 DIAGNOSIS — J439 Emphysema, unspecified: Secondary | ICD-10-CM | POA: Diagnosis not present

## 2019-10-16 DIAGNOSIS — N184 Chronic kidney disease, stage 4 (severe): Secondary | ICD-10-CM | POA: Diagnosis not present

## 2019-10-16 DIAGNOSIS — I2129 ST elevation (STEMI) myocardial infarction involving other sites: Secondary | ICD-10-CM | POA: Diagnosis not present

## 2019-10-16 DIAGNOSIS — E538 Deficiency of other specified B group vitamins: Secondary | ICD-10-CM | POA: Diagnosis not present

## 2019-10-16 DIAGNOSIS — I351 Nonrheumatic aortic (valve) insufficiency: Secondary | ICD-10-CM | POA: Diagnosis not present

## 2019-10-16 DIAGNOSIS — I129 Hypertensive chronic kidney disease with stage 1 through stage 4 chronic kidney disease, or unspecified chronic kidney disease: Secondary | ICD-10-CM | POA: Diagnosis not present

## 2019-10-16 DIAGNOSIS — I480 Paroxysmal atrial fibrillation: Secondary | ICD-10-CM | POA: Diagnosis not present

## 2019-10-22 ENCOUNTER — Telehealth: Payer: Self-pay | Admitting: Cardiology

## 2019-10-22 NOTE — Telephone Encounter (Signed)
Will review at follow-up office visit. Kirk Ruths

## 2019-10-22 NOTE — Telephone Encounter (Signed)
Called and spoke to the patient. She states that she has been SOB for the past several weeks with very minimal exertion, performing ADLs, etc. She states that she is on continuous oxygen at 3 L. She states that she has some swelling in her feet, but has lost weight. She believes her SOB is from starting plavix and stopping the flecainide and feels like she is back out of rhythm. She has refused anticoagulation in the past and states that she still feels the same way. She denies chest pain or any other Sx. BP today was 150/85. She did not have HR available but HR at last PCP visit on 8/9 was 119 bpm. She states that she has been taking meds as prescribed. She declined appointments for this week. I have scheduled the patient with Dr. Stanford Breed on 8/24. Made patient aware that I will forward information to Dr. Stanford Breed for review. ER precautions reviewed with the patient. She verbalized understanding and thanked me for the call.

## 2019-10-22 NOTE — Telephone Encounter (Signed)
Pt c/o medication issue:  1. Name of Medication: clopidogrel (PLAVIX) 75 MG tablet  2. How are you currently taking this medication (dosage and times per day)? As directed   3. Are you having a reaction (difficulty breathing--STAT)? Yes. Patient needs to be on oxygen 24/7.   4. What is your medication issue? Pt can not be without oxygen  Pt c/o Shortness Of Breath: STAT if SOB developed within the last 24 hours or pt is noticeably SOB on the phone  1. Are you currently SOB (can you hear that pt is SOB on the phone)? Sister is not with the patient now   2. How long have you been experiencing SOB?  2-3 weeks  3. Are you SOB when sitting or when up moving around? With movement   4. Are you currently experiencing any other symptoms? No  Sister of the patient called. The patient has been home from the hospital for ~3 weeks and since she has been home she needs to be on oxygen 24/7. The patient has tried to take the oxygen out to do simple tasks like get dressed or make her bed but she ends up feeling so weak she might pass out. Her sister is concerned because the cord to the tank is so long she might trip over it.  The sister really wants to know if there is another medication that the patient could be on instead of Plavix. The Sister dose not live with the patient but checks on her regularly.

## 2019-10-22 NOTE — Progress Notes (Signed)
HPI: FU CAD and paroxysmal atrial fibrillation. A previous monitor revealed paroxysmal atrial fibrillation/flutter with a rapid ventricular response. Note she refuses anticoagulation. A CardioNet was performed in Feb 2012 secondary to palpitations and revealed sinus rhythm with PACs and rare PVCs.   Patient had acute lateral myocardial infarction June 2021.  Cardiac catheterization revealed an occluded first diagonal and 30% mid LAD.  Patient had balloon angioplasty due to small vessel size. Readmitted with hypertensive urgency and congestive heart failure July 2021.  She was diuresed but creatinine increased and Lasix was discontinued.  Her blood pressure medications were adjusted.  Follow-up echocardiogram July 2021 showed ejection fraction 50 to 55%, mild left atrial enlargement and mild aortic insufficiency.  Since I last saw her,patient complains of dyspnea on exertion, orthopnea and bilateral lower extremity edema.  No chest pain or syncope.  Current Outpatient Medications  Medication Sig Dispense Refill  . amLODipine (NORVASC) 10 MG tablet Take 1 tablet (10 mg total) by mouth daily. 30 tablet 0  . aspirin 81 MG chewable tablet Chew 1 tablet (81 mg total) by mouth daily.    . Biotin 1000 MCG tablet Take 1,000 mcg by mouth daily.    . Cholecalciferol ([employer] VITAMIN D3) 1000 UNITS capsule Take 1,000 Units by mouth daily.      . clonazePAM (KLONOPIN) 0.5 MG tablet TAKE 1 TABLET BY MOUTH AT BEDTIME AS NEEDED FOR LEG CRAMPS (Patient taking differently: Take 0.5 mg by mouth daily as needed (For leg cramps per patient). ) 90 tablet 0  . clopidogrel (PLAVIX) 75 MG tablet Take 1 tablet (75 mg total) by mouth daily with breakfast. 60 tablet 2  . cyanocobalamin 1000 MCG tablet Take 1,000 mcg by mouth daily.    Marland Kitchen docusate sodium (COLACE) 100 MG capsule Take 1 capsule (100 mg total) by mouth 2 (two) times daily. 10 capsule 0  . Fluticasone-Umeclidin-Vilant (TRELEGY ELLIPTA) 100-62.5-25 MCG/INH AEPB  Inhale 1 Inhaler into the lungs daily. 3 each 3  . isosorbide mononitrate (IMDUR) 30 MG 24 hr tablet Take 1 tablet (30 mg total) by mouth daily. 30 tablet 0  . ketoconazole (NIZORAL) 2 % cream APPLY TOPICALLY TO THE AFFECTED AREA DAILY (Patient taking differently: Apply 1 application topically daily as needed for irritation. ) 45 g 1  . magic mouthwash w/lidocaine SOLN Take 5 mLs by mouth 4 (four) times daily as needed for mouth pain. 240 mL 0  . metoprolol tartrate (LOPRESSOR) 25 MG tablet Take 1 tablet (25 mg total) by mouth 2 (two) times daily. 60 tablet 3  . nitroGLYCERIN (NITROSTAT) 0.4 MG SL tablet Place 1 tablet (0.4 mg total) under the tongue every 5 (five) minutes as needed for chest pain. 25 tablet 0  . pantoprazole (PROTONIX) 40 MG tablet Take 1 tablet (40 mg total) by mouth daily. 90 tablet 3  . rosuvastatin (CRESTOR) 10 MG tablet Take 1 tablet (10 mg total) by mouth daily. 30 tablet 0  . tobramycin (TOBREX) 0.3 % ophthalmic solution Place 1 drop into both eyes every 6 (six) hours.      No current facility-administered medications for this visit.     Past Medical History:  Diagnosis Date  . Anxiety   . Arthritis   . Atrial fibrillation ([place])   . B12 deficiency   . COPD (chronic obstructive pulmonary disease) ([place])   . Depression   . GERD (gastroesophageal reflux disease)   . Hypertension   . Osteoarthrosis, hand    both hands  .  Peptic ulcer, unspecified site, unspecified as acute or chronic, without mention of hemorrhage, perforation, or obstruction   . Pneumonia   . PONV (postoperative nausea and vomiting)   . Vitamin D deficiency disease     Past Surgical History:  Procedure Laterality Date  . BLADDER SURGERY     Bladder tack and intestines  . CORONARY/GRAFT ACUTE MI REVASCULARIZATION N/A 08/26/2019   Procedure: Coronary/Graft Acute MI Revascularization;  Surgeon: Sherren Mocha, MD;  Location: [facility];  Service: Cardiovascular;  Laterality: N/A;  .  CYSTOCELE REPAIR    . CYSTOSCOPY WITH RETROGRADE PYELOGRAM, URETEROSCOPY AND STENT PLACEMENT Right 09/11/2016   Procedure: CYSTOSCOPY WITH RETROGRADE PYELOGRAM, URETEROSCOPY AND STENT REPLACEMENT;  Surgeon: Cleon Gustin, MD;  Location: [facility];  Service: Urology;  Laterality: Right;  . CYSTOSCOPY WITH STENT PLACEMENT Right 08/11/2016   Procedure: CYSTOSCOPY, URETERSCOPY,  RIGHT RETROGRADE WITH RIGHT STENT PLACEMENT;  Surgeon: Festus Aloe, MD;  Location: [facility];  Service: Urology;  Laterality: Right;  . SPLENECTOMY      Social History   Socioeconomic History  . Marital status: Divorced    Spouse name: Not on file  . Number of children: 2  . Years of education: Not on file  . Highest education level: Not on file  Occupational History  . Occupation: Airline pilot: [employer]  Tobacco Use  . Smoking status: Former Smoker    Packs/day: 1.00    Types: Cigarettes    Quit date: 2008-06-20    Years since quitting: 11.6  . Smokeless tobacco: Never Used  . Tobacco comment: pt unsure how many years she smoked  Vaping Use  . Vaping Use: Never used  Substance and Sexual Activity  . Alcohol use: No  . Drug use: No  . Sexual activity: Not Currently  Other Topics Concern  . Not on file  Social History Narrative   Retired, works part time   Divorced   Former Smoker   1- Son alcoholic abusive   Daughter died in 06/21/06   Social Determinants of Health   Financial Resource Strain: High Risk  . Difficulty of Paying Living Expenses: Very hard  Food Insecurity: Food Insecurity Present  . Worried About Charity fundraiser in the Last Year: Often true  . Ran Out of Food in the Last Year: Often true  Transportation Needs: Unmet Transportation Needs  . Lack of Transportation (Medical): Yes  . Lack of Transportation (Non-Medical): Yes  Physical Activity: Unknown  . Days of Exercise per Week: Patient refused  . Minutes of Exercise per Session: Not on file  Stress: No Stress Concern Present   . Feeling of Stress : Not at all  Social Connections:   . Frequency of Communication with Friends and Family: Not on file  . Frequency of Social Gatherings with Friends and Family: Not on file  . Attends Religious Services: Not on file  . Active Member of Clubs or Organizations: Not on file  . Attends Archivist Meetings: Not on file  . Marital Status: Not on file  Intimate Partner Violence:   . Fear of Current or Ex-Partner: Not on file  . Emotionally Abused: Not on file  . Physically Abused: Not on file  . Sexually Abused: Not on file    Family History  Problem Relation Age of Onset  . Heart disease Father   . Arthritis Other        Family history of  . Coronary artery disease Other  Family history of  . Hypertension Other        Family history of  . Diabetes Daughter        29 died    ROS: no fevers or chills, productive cough, hemoptysis, dysphasia, odynophagia, melena, hematochezia, dysuria, hematuria, rash, seizure activity, orthopnea, PND, claudication. Remaining systems are negative.  Physical Exam: Well-developed well-nourished in no acute distress.  Skin is warm and dry.  HEENT is normal.  Neck is supple.  Chest is clear to auscultation with normal expansion.  Cardiovascular exam is regular rate and rhythm.  Abdominal exam nontender or distended. No masses palpated. Extremities show 1+ edema. neuro grossly intact  ECG-sinus rhythm at a rate of 88, lateral infarct, nonspecific T wave changes.  Personally reviewed  A/P  1 coronary artery disease-patient denies recurrent chest pain.  Continue medical therapy with aspirin, Plavix and statin.  2 acute on chronic chronic diastolic congestive heart failure-patient is volume overloaded today.  We will add Lasix 40 mg daily for 3 days then 20 mg daily thereafter.  Needs fluid restriction and low-sodium diet.  Check potassium and renal function today and creatinine in 1 week.  We will arrange follow-up  with APP in 1 week to make sure that she is improving.  I will see her back in 8 to 12 weeks.  3 history of paroxysmal atrial fibrillation-patient is in sinus rhythm on examination.  Continue beta-blocker.  She declines anticoagulation and understands the higher risk of CVA.  Note she was on flecainide previously but this has been discontinued now that coronary artery disease has been documented.  4 hypertension-blood pressure is elevated.  We are adding Lasix.  Follow and adjust regimen as needed.  5 chronic stage III kidney disease-plan repeat bmet today.  6 hyperlipidemia-continue statin.  Kirk Ruths, MD

## 2019-10-28 ENCOUNTER — Other Ambulatory Visit: Payer: Self-pay

## 2019-10-28 ENCOUNTER — Encounter: Payer: Self-pay | Admitting: Cardiology

## 2019-10-28 ENCOUNTER — Ambulatory Visit: Payer: Medicare Other | Admitting: Cardiology

## 2019-10-28 VITALS — BP 198/80 | HR 88 | Ht 64.0 in | Wt 123.0 lb

## 2019-10-28 DIAGNOSIS — E785 Hyperlipidemia, unspecified: Secondary | ICD-10-CM

## 2019-10-28 DIAGNOSIS — N183 Chronic kidney disease, stage 3 unspecified: Secondary | ICD-10-CM

## 2019-10-28 DIAGNOSIS — I10 Essential (primary) hypertension: Secondary | ICD-10-CM

## 2019-10-28 DIAGNOSIS — I5031 Acute diastolic (congestive) heart failure: Secondary | ICD-10-CM | POA: Diagnosis not present

## 2019-10-28 DIAGNOSIS — I2510 Atherosclerotic heart disease of native coronary artery without angina pectoris: Secondary | ICD-10-CM

## 2019-10-28 DIAGNOSIS — I480 Paroxysmal atrial fibrillation: Secondary | ICD-10-CM

## 2019-10-28 MED ORDER — FUROSEMIDE 20 MG PO TABS
20.0000 mg | ORAL_TABLET | Freq: Every day | ORAL | 3 refills | Status: DC
Start: 2019-10-28 — End: 2019-12-03

## 2019-10-28 NOTE — Patient Instructions (Signed)
Medication Instructions:   TAKE FUROSEMIDE 40 MG FOR THE NEXT 3 DAYS AND THEN TAKE 20 MG EVERY DAY= 2 OF THE 20 MG TABLETS ONCE DAILY FOR THE NEXT 3 DAYS AND THEN TAKE 1 TABLET DAILY  *If you need a refill on your cardiac medications before your next appointment, please call your pharmacy*   Lab Work:  Your physician recommends that you HAVE LAB [place]  Your physician recommends that you return for lab work in: [place]  If you have labs (blood work) drawn today and your tests are completely normal, you will receive your results only by: Marland Kitchen MyChart Message (if you have MyChart) OR . A paper copy in the mail If you have any lab test that is abnormal or we need to change your treatment, we will call you to review the results.   Follow-Up: At [facility], you and your health needs are our priority.  As part of our continuing mission to provide you with exceptional heart care, we have created designated Provider Care Teams.  These Care Teams include your primary Cardiologist (physician) and Advanced Practice Providers (APPs -  Physician Assistants and Nurse Practitioners) who all work together to provide you with the care you need, when you need it.  We recommend signing up for the patient portal called "MyChart".  Sign up information is provided on this After Visit Summary.  MyChart is used to connect with patients for Virtual Visits (Telemedicine).  Patients are able to view lab/test results, encounter notes, upcoming appointments, etc.  Non-urgent messages can be sent to your provider as well.   To learn more about what you can do with MyChart, go to [email or web address].    Your next appointment:    Your physician recommends that you schedule a follow-up appointment in: [place] physician recommends that you schedule a follow-up appointment in: [place]

## 2019-10-29 ENCOUNTER — Encounter: Payer: Self-pay | Admitting: *Deleted

## 2019-10-29 LAB — BASIC METABOLIC PANEL
BUN/Creatinine Ratio: 13 (ref 12–28)
BUN: 18 mg/dL (ref 8–27)
CO2: 19 mmol/L — ABNORMAL LOW (ref 20–29)
Calcium: 9.3 mg/dL (ref 8.7–10.3)
Chloride: 99 mmol/L (ref 96–106)
Creatinine, Ser: 1.38 mg/dL — ABNORMAL HIGH (ref 0.57–1.00)
GFR calc Af Amer: 41 mL/min/{1.73_m2} — ABNORMAL LOW (ref >59)
GFR calc non Af Amer: 36 mL/min/{1.73_m2} — ABNORMAL LOW (ref >59)
Glucose: 82 mg/dL (ref 65–99)
Potassium: 5.2 mmol/L (ref 3.5–5.2)
Sodium: 140 mmol/L (ref 134–144)

## 2019-10-29 NOTE — Telephone Encounter (Signed)
error 

## 2019-10-30 ENCOUNTER — Telehealth: Payer: Self-pay | Admitting: Cardiology

## 2019-10-30 DIAGNOSIS — J449 Chronic obstructive pulmonary disease, unspecified: Secondary | ICD-10-CM | POA: Diagnosis not present

## 2019-10-30 DIAGNOSIS — J438 Other emphysema: Secondary | ICD-10-CM | POA: Diagnosis not present

## 2019-10-30 NOTE — Telephone Encounter (Signed)
Spoke with pt, questions regarding mediations and how to take them answered.

## 2019-10-30 NOTE — Telephone Encounter (Signed)
Patient states that since she started on the fluid pill, her heart rate has been up. Going to the bathroom, she gets sob and her heart rate feels like its flying. She is unsure of the readings of her heart rate when she feels like it is flying. She states that after attempting to make her bed in the morning, she feels like she is done for. She said the swelling is going down some, but slowly.   She is also wanting to verify how she needs to be taking furosemide (LASIX) 20 MG tablet. She states that the directions are very confusing.

## 2019-10-31 ENCOUNTER — Telehealth: Payer: Self-pay | Admitting: Internal Medicine

## 2019-10-31 NOTE — Telephone Encounter (Signed)
   1.Medication Requested: amLODipine (NORVASC) 10 MG tablet  2. Pharmacy (Name, Street, [place], [place] - [address] AT [place]  3. On Med List: YES  4. Last Visit with PCP: 10/13/19  5. Next visit date with PCP: 01/14/20   Agent: Please be advised that RX refills may take up to 3 business days. We ask that you follow-up with your pharmacy.

## 2019-11-03 NOTE — Telephone Encounter (Signed)
New Message:   Pt is calling to check on the status of her refill request. She states she is completely out of this medication. Please advise.

## 2019-11-03 NOTE — Progress Notes (Signed)
Cardiology Clinic Note   Patient Name: [name] Date of Encounter: 11/05/2019  Primary Care Provider:  Cassandria Anger, MD Primary Cardiologist:  Kirk Ruths, MD  Patient Profile    [name]. [place] 84 year old female presents the clinic today for follow-up of her essential hypertension, paroxysmal atrial fibrillation, and coronary artery disease.  Past Medical History    Past Medical History:  Diagnosis Date  . Anxiety   . Arthritis   . Atrial fibrillation ([place])   . B12 deficiency   . COPD (chronic obstructive pulmonary disease) ([place])   . Depression   . GERD (gastroesophageal reflux disease)   . Hypertension   . Osteoarthrosis, hand    both hands  . Peptic ulcer, unspecified site, unspecified as acute or chronic, without mention of hemorrhage, perforation, or obstruction   . Pneumonia   . PONV (postoperative nausea and vomiting)   . Vitamin D deficiency disease    Past Surgical History:  Procedure Laterality Date  . BLADDER SURGERY     Bladder tack and intestines  . CORONARY/GRAFT ACUTE MI REVASCULARIZATION N/A 08/26/2019   Procedure: Coronary/Graft Acute MI Revascularization;  Surgeon: Sherren Mocha, MD;  Location: [facility];  Service: Cardiovascular;  Laterality: N/A;  . CYSTOCELE REPAIR    . CYSTOSCOPY WITH RETROGRADE PYELOGRAM, URETEROSCOPY AND STENT PLACEMENT Right 09/11/2016   Procedure: CYSTOSCOPY WITH RETROGRADE PYELOGRAM, URETEROSCOPY AND STENT REPLACEMENT;  Surgeon: Cleon Gustin, MD;  Location: [facility];  Service: Urology;  Laterality: Right;  . CYSTOSCOPY WITH STENT PLACEMENT Right 08/11/2016   Procedure: CYSTOSCOPY, URETERSCOPY,  RIGHT RETROGRADE WITH RIGHT STENT PLACEMENT;  Surgeon: Festus Aloe, MD;  Location: [facility];  Service: Urology;  Laterality: Right;  . SPLENECTOMY      Allergies  Allergies  Allergen Reactions  . Amoxicillin Other (See Comments)    "makes me crazy" Has patient had a PCN reaction causing immediate  rash, facial/tongue/throat swelling, SOB or lightheadedness with hypotension: No Has patient had a PCN reaction causing severe rash involving mucus membranes or skin necrosis: No Has patient had a PCN reaction that required hospitalization: No Has patient had a PCN reaction occurring within the last 10 years: No If all of the above answers are "NO", then may proceed with Cephalosporin use.   . Codeine Nausea And Vomiting  . Levaquin [Levofloxacin In D5w] Nausea And Vomiting  . Other Nausea And Vomiting    Pt does not want to take any pain meds    History of Present Illness    [name] a PMH of essential hypertension, paroxysmal atrial fibrillation (not on anticoagulation-patient refused), syncope, varicose veins of both lower extremities, CAD status post PCA, COPD with chronic bronchitis, acute sinusitis, CKD stage III, and emphysema.  She is admitted to the hospital on 08/26/2019 for acute lateral STEMI and underwent PTCA of the first diagonal which was 100 % percent stenosed (reduced to 50%) no PCI was done due to the small caliber of the vessel and minimal downstream blood flow.  She had been in usual state of health until the morning of presentation when she developed severe substernal chest pain with nausea and weakness.  She indicated that she felt ill throughout the morning and eventually called EMS.  Her EKG on arrival demonstrated lateral STEMI and a code STEMI was activated.  Upon arrival to the emergency department she continued to have discomfort in her chest, felt lightheaded, and nauseous.  She indicated that she had some shortness of breath as  well.  However, she has known COPD.  Echocardiogram showed normal LVEF with suboptimal view for wall motion assessments, mild LVH and no valvular disease.  Her creatinine was elevated but appeared to be at her more recent baseline of 1.5-2.0.  Her creatinine on 08/27/2019 was 1.82.  Peak high-sensitivity troponins 3863.  Her LDL 154.   Hemoglobin A1c 6.3.  While she was ambulating with cardiac rehab her oxygen saturation dropped to 87% therefore she was discharged on supplemental oxygen, 3 L nasal cannula.  She presented to the clinic 09/10/2019 for follow-up evaluation and stated she did not want to be on clopidogrel.  She felt that being on blood thinner was too high of a risk.  It was explained that she needed this medication to keep her diagonal lesion PTCA open.  She indicated that she had occasional palpitations at night when laying down to go to sleep.  On exam her heart rate and rhythm were regular.  I increased her metoprolol to 25 mg twice daily due to elevated blood pressure and planned her follow-up with me in 1 month for reevaluation.    She presented to the emergency department on 09/12/2019 with complaints of shortness of breath and elevated blood pressure.  Her echocardiogram showed low normal LV function.  Her Lopressor was increased to 50 mg twice daily.  She was instructed to weigh herself daily.  Her creatinine has slightly increased from 1.79-1.83.  She was seen in follow-up by Dr. Stanford Breed on 10/28/2019 for her proximal atrial fibrillation.  She again refused anticoagulation.  She indicated that she had DOE, orthopnea and bilateral lower extremity edema.  She denied chest pain syncope.  Furosemide was added back into her medication regimen for lower extremity edema.  She presents to the clinic today for follow-up evaluation and states her breathing has not improved much.  She states that her shoes are fitting much better in her pants and close are fitting much better.  However, she is concerned that she is losing so much weight.  She states that she is drinking 15 cups of water per day and eating 5-6 times per day.  She does not understand why she is still losing weight.  We discussed the importance of limiting her fluid and weighing herself daily.  She expressed understanding that the fluid would be impacting her  breathing.  I will continue her current dose of furosemide, have her restrict her fluids, continue to weigh daily, avoid salt in her diet and follow-up as scheduled with Dr. Stanford Breed.  She denies chest pain, increased shortness of breath, lower extremity edema, fatigue, palpitations, melena, hematuria, hemoptysis, diaphoresis, weakness, presyncope, syncope, orthopnea, and PND.  Home Medications    Prior to Admission medications   Medication Sig Start Date End Date Taking? Authorizing Provider  amLODipine (NORVASC) 10 MG tablet Take 1 tablet (10 mg total) by mouth daily. 09/19/19   Lavina Hamman, MD  aspirin 81 MG chewable tablet Chew 1 tablet (81 mg total) by mouth daily. 08/29/19   Kathyrn Drown D, NP  Biotin 1000 MCG tablet Take 1,000 mcg by mouth daily.    [provider]  Cholecalciferol ([employer] VITAMIN D3) 1000 UNITS capsule Take 1,000 Units by mouth daily.      [provider]  clonazePAM (KLONOPIN) 0.5 MG tablet TAKE 1 TABLET BY MOUTH AT BEDTIME AS NEEDED FOR LEG CRAMPS Patient taking differently: Take 0.5 mg by mouth daily as needed (For leg cramps per patient).  02/05/19   Plotnikov,  Evie Lacks, MD  clopidogrel (PLAVIX) 75 MG tablet Take 1 tablet (75 mg total) by mouth daily with breakfast. 08/29/19   Kathyrn Drown D, NP  cyanocobalamin 1000 MCG tablet Take 1,000 mcg by mouth daily.    [provider]  docusate sodium (COLACE) 100 MG capsule Take 1 capsule (100 mg total) by mouth 2 (two) times daily. 09/18/19   Lavina Hamman, MD  Fluticasone-Umeclidin-Vilant (TRELEGY ELLIPTA) 100-62.5-25 MCG/INH AEPB Inhale 1 Inhaler into the lungs daily. 10/13/19   Plotnikov, Evie Lacks, MD  furosemide (LASIX) 20 MG tablet Take 1 tablet (20 mg total) by mouth daily. 10/28/19 01/26/20  Lelon Perla, MD  isosorbide mononitrate (IMDUR) 30 MG 24 hr tablet Take 1 tablet (30 mg total) by mouth daily. 09/19/19   Lavina Hamman, MD  ketoconazole (NIZORAL) 2 % cream APPLY TOPICALLY TO  THE AFFECTED AREA DAILY Patient taking differently: Apply 1 application topically daily as needed for irritation.  08/11/19   Plotnikov, Evie Lacks, MD  magic mouthwash w/lidocaine SOLN Take 5 mLs by mouth 4 (four) times daily as needed for mouth pain. 07/10/19   Plotnikov, Evie Lacks, MD  metoprolol tartrate (LOPRESSOR) 25 MG tablet Take 1 tablet (25 mg total) by mouth 2 (two) times daily. 09/10/19   Deberah Pelton, NP  nitroGLYCERIN (NITROSTAT) 0.4 MG SL tablet Place 1 tablet (0.4 mg total) under the tongue every 5 (five) minutes as needed for chest pain. 08/28/19 08/27/20  Kathyrn Drown D, NP  pantoprazole (PROTONIX) 40 MG tablet Take 1 tablet (40 mg total) by mouth daily. 02/05/19   Plotnikov, Evie Lacks, MD  rosuvastatin (CRESTOR) 10 MG tablet Take 1 tablet (10 mg total) by mouth daily. 09/19/19   Lavina Hamman, MD  tobramycin (TOBREX) 0.3 % ophthalmic solution Place 1 drop into both eyes every 6 (six) hours.  11/30/18   [provider]    Family History    Family History  Problem Relation Age of Onset  . Heart disease Father   . Arthritis Other        Family history of  . Coronary artery disease Other        Family history of  . Hypertension Other        Family history of  . Diabetes Daughter        43 died   She indicated that her mother is deceased. She indicated that her father is deceased. She indicated that her maternal grandmother is deceased. She indicated that her maternal grandfather is deceased. She indicated that her paternal grandmother is deceased. She indicated that her paternal grandfather is deceased. She indicated that the status of her daughter is unknown. She indicated that the status of her other is unknown.  Social History    Social History   Socioeconomic History  . Marital status: Divorced    Spouse name: Not on file  . Number of children: 2  . Years of education: Not on file  . Highest education level: Not on file  Occupational History  . Occupation:  Airline pilot: [employer]  Tobacco Use  . Smoking status: Former Smoker    Packs/day: 1.00    Types: Cigarettes    Quit date: 2010    Years since quitting: 11.6  . Smokeless tobacco: Never Used  . Tobacco comment: pt unsure how many years she smoked  Vaping Use  . Vaping Use: Never used  Substance and Sexual Activity  . Alcohol use: No  .  Drug use: No  . Sexual activity: Not Currently  Other Topics Concern  . Not on file  Social History Narrative   Retired, works part time   Divorced   Former Smoker   1- Son alcoholic abusive   Daughter died in Jun 08, 2006   Social Determinants of Health   Financial Resource Strain: High Risk  . Difficulty of Paying Living Expenses: Very hard  Food Insecurity: Food Insecurity Present  . Worried About Charity fundraiser in the Last Year: Often true  . Ran Out of Food in the Last Year: Often true  Transportation Needs: Unmet Transportation Needs  . Lack of Transportation (Medical): Yes  . Lack of Transportation (Non-Medical): Yes  Physical Activity: Unknown  . Days of Exercise per Week: Patient refused  . Minutes of Exercise per Session: Not on file  Stress: No Stress Concern Present  . Feeling of Stress : Not at all  Social Connections:   . Frequency of Communication with Friends and Family: Not on file  . Frequency of Social Gatherings with Friends and Family: Not on file  . Attends Religious Services: Not on file  . Active Member of Clubs or Organizations: Not on file  . Attends Archivist Meetings: Not on file  . Marital Status: Not on file  Intimate Partner Violence:   . Fear of Current or Ex-Partner: Not on file  . Emotionally Abused: Not on file  . Physically Abused: Not on file  . Sexually Abused: Not on file     Review of Systems    General:  No chills, fever, night sweats or weight changes.  Cardiovascular:  No chest pain, dyspnea on exertion, edema, orthopnea, palpitations, paroxysmal nocturnal  dyspnea. Dermatological: No rash, lesions/masses Respiratory: No cough, dyspnea Urologic: No hematuria, dysuria Abdominal:   No nausea, vomiting, diarrhea, bright red blood per rectum, melena, or hematemesis Neurologic:  No visual changes, wkns, changes in mental status. All other systems reviewed and are otherwise negative except as noted above.  Physical Exam    VS:  BP 132/70   Pulse 73   Ht 5\' 4"  (1.626 m)   Wt 116 lb 3.2 oz (52.7 kg)   SpO2 94%   BMI 19.95 kg/m  , BMI Body mass index is 19.95 kg/m. GEN: Well nourished, well developed, in no acute distress. HEENT: normal. Neck: Supple, no JVD, carotid bruits, or masses. Cardiac: RRR, no murmurs, rubs, or gallops. No clubbing, cyanosis, bilateral +1 lower extremity pitting edema.  Radials/DP/PT 2+ and equal bilaterally.  Respiratory:  Respirations regular and unlabored, clear to auscultation bilaterally. GI: Soft, nontender, nondistended, BS + x 4. MS: no deformity or atrophy. Skin: warm and dry, no rash. Neuro:  Strength and sensation are intact. Psych: Normal affect.  Accessory Clinical Findings    Recent Labs: 09/15/2019: B Natriuretic Peptide 2,581.2 09/16/2019: TSH 3.830 09/17/2019: ALT 16 09/18/2019: Hemoglobin 11.5; Magnesium 1.7; Platelets 224 10/28/2019: BUN 18; Creatinine, Ser 1.38; Potassium 5.2; Sodium 140   Recent Lipid Panel    Component Value Date/Time   CHOL 239 (H) 08/26/2019 1240   TRIG 155 (H) 08/26/2019 1240   HDL 54 08/26/2019 1240   CHOLHDL 4.4 08/26/2019 1240   VLDL 31 08/26/2019 1240   LDLCALC 154 (H) 08/26/2019 1240   LDLDIRECT 144.3 01/31/2011 0905    ECG personally reviewed by me today-none today. EKG 08/29/2019 Sinus rhythm with first-degree AV block septal infarct undetermined age 24 bpm  Echocardiogram 08/27/2019 IMPRESSIONS    1. Left  ventricular ejection fraction, by estimation, is 60 to 65%. The  left ventricle has normal function. Left ventricular endocardial border  not  optimally defined to evaluate regional wall motion. There is mild  concentric left ventricular  hypertrophy. Left ventricular diastolic function could not be evaluated.  2. Right ventricular systolic function is normal. The right ventricular  size is normal.  3. Left atrial size was moderately dilated.  4. The mitral valve is normal in structure. No evidence of mitral valve  regurgitation. No evidence of mitral stenosis.  5. The aortic valve is tricuspid. Aortic valve regurgitation is mild.   Cardiac catheterization 08/26/2019  1st Diag lesion is 100% stenosed.  Balloon angioplasty was performed using a BALLOON SAPPHIRE 2.0X12.  Post intervention, there is a 50% residual stenosis.  Mid LAD lesion is 30% stenosed.  1. Total occlusion of the superior branch of the first diagonal with contrast staining, consistent with infarct vessel, treated with balloon angioplasty alone due to small vessel size and poor distal runoff 2. Calcified coronary arteries with no significant obstructive disease in the left main, left circumflex, LAD proper, or RCA 3. Minimally elevated LVEDP  Plan: ICU care, add clopidogrel when nausea subsides, risk reduction measures as tolerated. Unclear whether this is a de novo plaque rupture versus an embolic event. Patient does have known paroxysmal atrial fibrillation and has refused anticoagulation. Diagnostic Dominance: Right  Intervention   Echocardiogram 09/16/2019 IMPRESSIONS    1. Left ventricular ejection fraction, by estimation, is 50 to 55%. The  left ventricle has low normal function. The left ventricle has no regional  wall motion abnormalities. There is mild concentric left ventricular  hypertrophy. Left ventricular  diastolic function could not be evaluated.  2. Right ventricular systolic function is normal. The right ventricular  size is normal. Tricuspid regurgitation signal is inadequate for assessing  PA pressure.  3. Left  atrial size was mildly dilated.  4. The mitral valve is normal in structure. Trivial mitral valve  regurgitation. No evidence of mitral stenosis.  5. The aortic valve is tricuspid. Aortic valve regurgitation is mild. No  aortic stenosis is present.  6. The inferior vena cava is dilated in size with <50% respiratory  variability, suggesting right atrial pressure of 15 mmHg.  Assessment & Plan   1.  Acute on chronic diastolic CHF-euvolemic today.  Weight today 116.2.  Much less DOE.  Echocardiogram 09/16/2019 showed LVEF of 36-62%, diastolic parameters could not be evaluated, mild left atrial dilation, trivial mitral valve regurgitation mild aortic valve regurgitation, and elevated right atrial pressures.  Denies dizziness, presyncope, syncope Continue furosemide 20 mg daily Heart healthy low-sodium diet-salty 6 given Increase physical activity as tolerated-instructed to use walker for safety reasons, not stability reasons.   Continue daily weights-contact office with a weight increase of 3 pounds overnight or 5 pounds in 1 week   Coronary artery disease -no chest pain today.  Lateral wall status post percutaneous coronary angioplasty to diagonal branch 08/26/2019.  Echocardiogram showed LVEF 60-65%, suboptimal view for wall motion assessment, mild LVH, no valvular disease Continue aspirin, Plavix, atorvastatin, amlodipine, metoprolol, Imdur nitroglycerin  Increase metoprolol to 25 mg twice daily Heart healthy low-sodium diet-salty 6 given Increase physical activity as tolerated   PAF-heart rate today regular 86 bpm.  No recent episodes of palpitations. Continue metoprolol, aspirin, Plavix Heart healthy low-sodium diet-salty 6 given Increase physical activity as tolerated  Essential hypertension-BP today  132/70 well-controlled at home.  Holding ACE/ARB due to renal function. Continue amlodipine,Continue  metoprolol  Heart healthy low-sodium diet-salty 6 given Increase physical  activity as tolerated  Hyperlipidemia-goal less than 70.  Low-dose rosuvastatin prescribed due to advanced age. Continue rosuvastatin 20 mg daily Heart healthy low-sodium high-fiber diet Increase physical activity as tolerated  COPD with chronic bronchitis-presents on room air today.  No increased work of breathing today.  States that she does not use home oxygen but was prescribed this.  94% on room air Follow-up with pulmonary/PCP  Disposition: Follow-up with Dr. Stanford Breed or myself in as scheduled.  Jossie Ng. Raeann Offner NP-C    11/05/2019, 3:43 PM [address] Office [phone] Fax [phone]  Notice: This dictation was prepared with Dragon dictation along with smaller phrase technology. Any transcriptional errors that result from this process are unintentional and may not be corrected upon review.

## 2019-11-04 NOTE — Telephone Encounter (Signed)
    Patient calling for status of refill. Patient has not taken medication in 3 days Last order for medication was upon discharge from the hospital

## 2019-11-05 ENCOUNTER — Other Ambulatory Visit: Payer: Self-pay

## 2019-11-05 ENCOUNTER — Encounter: Payer: Self-pay | Admitting: General Practice

## 2019-11-05 ENCOUNTER — Ambulatory Visit (INDEPENDENT_AMBULATORY_CARE_PROVIDER_SITE_OTHER): Payer: Medicare Other | Admitting: General Practice

## 2019-11-05 VITALS — BP 132/70 | HR 73 | Ht 64.0 in | Wt 116.2 lb

## 2019-11-05 DIAGNOSIS — J449 Chronic obstructive pulmonary disease, unspecified: Secondary | ICD-10-CM | POA: Diagnosis not present

## 2019-11-05 DIAGNOSIS — I5031 Acute diastolic (congestive) heart failure: Secondary | ICD-10-CM

## 2019-11-05 DIAGNOSIS — I2510 Atherosclerotic heart disease of native coronary artery without angina pectoris: Secondary | ICD-10-CM | POA: Diagnosis not present

## 2019-11-05 DIAGNOSIS — I480 Paroxysmal atrial fibrillation: Secondary | ICD-10-CM

## 2019-11-05 DIAGNOSIS — E785 Hyperlipidemia, unspecified: Secondary | ICD-10-CM

## 2019-11-05 MED ORDER — AMLODIPINE BESYLATE 10 MG PO TABS: 10.0000 mg | ORAL_TABLET | Freq: Every day | ORAL | 3 refills | Status: DC

## 2019-11-05 NOTE — Patient Instructions (Signed)
Medication Instructions:  The current medical regimen is effective;  continue present plan and medications as directed. Please refer to the Current Medication list given to you today.  *If you need a refill on your cardiac medications before your next appointment, please call your pharmacy*  Lab Work: NONE  Special Instructions FLUID RESTRICTION 64 OUNCES DAILY  PLEASE READ AND FOLLOW SALTY 6-ATTACHED  PLEASE INCREASE PHYSICAL ACTIVITY AS TOLERATED  Follow-Up: Your next appointment:  KEEP SCHEDULED APPT   In Person with Kirk Ruths, MD   At [facility], you and your health needs are our priority.  As part of our continuing mission to provide you with exceptional heart care, we have created designated Provider Care Teams.  These Care Teams include your primary Cardiologist (physician) and Advanced Practice Providers (APPs -  Physician Assistants and Nurse Practitioners) who all work together to provide you with the care you need, when you need it.  We recommend signing up for the patient portal called "MyChart".  Sign up information is provided on this After Visit Summary.  MyChart is used to connect with patients for Virtual Visits (Telemedicine).  Patients are able to view lab/test results, encounter notes, upcoming appointments, etc.  Non-urgent messages can be sent to your provider as well.   To learn more about what you can do with MyChart, go to [email or web address].

## 2019-11-05 NOTE — Telephone Encounter (Signed)
Please see 11/05/19 med refill

## 2019-11-06 ENCOUNTER — Other Ambulatory Visit: Payer: Self-pay | Admitting: *Deleted

## 2019-11-06 DIAGNOSIS — I5031 Acute diastolic (congestive) heart failure: Secondary | ICD-10-CM

## 2019-11-06 LAB — BASIC METABOLIC PANEL
BUN/Creatinine Ratio: 14 (ref 12–28)
BUN: 22 mg/dL (ref 8–27)
CO2: 24 mmol/L (ref 20–29)
Calcium: 9.5 mg/dL (ref 8.7–10.3)
Chloride: 93 mmol/L — ABNORMAL LOW (ref 96–106)
Creatinine, Ser: 1.52 mg/dL — ABNORMAL HIGH (ref 0.57–1.00)
GFR calc Af Amer: 37 mL/min/{1.73_m2} — ABNORMAL LOW (ref >59)
GFR calc non Af Amer: 32 mL/min/{1.73_m2} — ABNORMAL LOW (ref >59)
Glucose: 105 mg/dL — ABNORMAL HIGH (ref 65–99)
Sodium: 139 mmol/L (ref 134–144)

## 2019-11-07 ENCOUNTER — Other Ambulatory Visit: Payer: Self-pay

## 2019-11-07 DIAGNOSIS — N183 Chronic kidney disease, stage 3 unspecified: Secondary | ICD-10-CM

## 2019-11-08 LAB — BASIC METABOLIC PANEL
BUN/Creatinine Ratio: 14 (ref 12–28)
BUN: 22 mg/dL (ref 8–27)
CO2: 28 mmol/L (ref 20–29)
Calcium: 9.5 mg/dL (ref 8.7–10.3)
Chloride: 96 mmol/L (ref 96–106)
Creatinine, Ser: 1.52 mg/dL — ABNORMAL HIGH (ref 0.57–1.00)
GFR calc Af Amer: 37 mL/min/{1.73_m2} — ABNORMAL LOW (ref >59)
GFR calc non Af Amer: 32 mL/min/{1.73_m2} — ABNORMAL LOW (ref >59)
Glucose: 98 mg/dL (ref 65–99)
Potassium: 4.2 mmol/L (ref 3.5–5.2)
Sodium: 143 mmol/L (ref 134–144)

## 2019-11-11 ENCOUNTER — Telehealth: Payer: Self-pay | Admitting: Cardiology

## 2019-11-11 NOTE — Telephone Encounter (Signed)
Pt c/o Shortness Of Breath: STAT if SOB developed within the last 24 hours or pt is noticeably SOB on the phone  1. Are you currently SOB (can you hear that pt is SOB on the phone)? Yes and I hear I can hear patient. 2. How long have you been experiencing SOB? June   3. Are you SOB when sitting or when up moving around? both  4. Are you currently experiencing any other symptoms? Dizzy

## 2019-11-11 NOTE — Telephone Encounter (Signed)
Spoke to patient she stated she continues to be sob and heart out of rhythm.Stated she feels awful.She is unable to do anything.Stated she would like to be seen.Appointment scheduled with Coletta Memos NP 9/10 at 11:15 am.Advised to go to ED if symptoms worsen.

## 2019-11-13 NOTE — Progress Notes (Signed)
Cardiology Clinic Note   Patient Name: [name] Date of Encounter: 11/14/2019  Primary Care Provider:  Cassandria Anger, MD Primary Cardiologist:  Kirk Ruths, MD  Patient Profile    [name]. [place] 84 year old female presents the clinic today for follow-up of her essential hypertension, paroxysmal atrial fibrillation, and coronary artery disease.  Past Medical History    Past Medical History:  Diagnosis Date  . Anxiety   . Arthritis   . Atrial fibrillation ([place])   . B12 deficiency   . COPD (chronic obstructive pulmonary disease) ([place])   . Depression   . GERD (gastroesophageal reflux disease)   . Hypertension   . Osteoarthrosis, hand    both hands  . Peptic ulcer, unspecified site, unspecified as acute or chronic, without mention of hemorrhage, perforation, or obstruction   . Pneumonia   . PONV (postoperative nausea and vomiting)   . Vitamin D deficiency disease    Past Surgical History:  Procedure Laterality Date  . BLADDER SURGERY     Bladder tack and intestines  . CORONARY/GRAFT ACUTE MI REVASCULARIZATION N/A 08/26/2019   Procedure: Coronary/Graft Acute MI Revascularization;  Surgeon: Sherren Mocha, MD;  Location: [facility];  Service: Cardiovascular;  Laterality: N/A;  . CYSTOCELE REPAIR    . CYSTOSCOPY WITH RETROGRADE PYELOGRAM, URETEROSCOPY AND STENT PLACEMENT Right 09/11/2016   Procedure: CYSTOSCOPY WITH RETROGRADE PYELOGRAM, URETEROSCOPY AND STENT REPLACEMENT;  Surgeon: Cleon Gustin, MD;  Location: [facility];  Service: Urology;  Laterality: Right;  . CYSTOSCOPY WITH STENT PLACEMENT Right 08/11/2016   Procedure: CYSTOSCOPY, URETERSCOPY,  RIGHT RETROGRADE WITH RIGHT STENT PLACEMENT;  Surgeon: Festus Aloe, MD;  Location: [facility];  Service: Urology;  Laterality: Right;  . SPLENECTOMY      Allergies  Allergies  Allergen Reactions  . Amoxicillin Other (See Comments)    "makes me crazy" Has patient had a PCN reaction causing immediate  rash, facial/tongue/throat swelling, SOB or lightheadedness with hypotension: No Has patient had a PCN reaction causing severe rash involving mucus membranes or skin necrosis: No Has patient had a PCN reaction that required hospitalization: No Has patient had a PCN reaction occurring within the last 10 years: No If all of the above answers are "NO", then may proceed with Cephalosporin use.   . Codeine Nausea And Vomiting  . Levaquin [Levofloxacin In D5w] Nausea And Vomiting  . Other Nausea And Vomiting    Pt does not want to take any pain meds    History of Present Illness    [name] has a PMH of essential hypertension, paroxysmal atrial fibrillation (not on anticoagulation-patient refused), syncope, varicose veins of both lower extremities, CAD status post PCA, COPD with chronic bronchitis, acute sinusitis, CKD stage III, and emphysema.  She is admitted to the hospital on 08/26/2019 for acute lateral STEMI and underwent PTCA of the first diagonal which was100 %percent stenosed (reduced to 50%) no PCI was done due to the small caliber of the vessel and minimal downstream blood flow.  She had been in usual state of health until the morning of presentation when she developed severe substernal chest pain with nausea and weakness. She indicated that she felt ill throughout the morning and eventually called EMS. Her EKG on arrival demonstrated lateral STEMI and a code STEMI was activated. Upon arrival to the emergency department she continued to have discomfort in her chest, felt lightheaded, and nauseous. She indicated that she had some shortness of breath as well. However, she has known  COPD. Echocardiogram showed normal LVEF with suboptimal view for wall motion assessments, mild LVH and no valvular disease. Her creatinine was elevated but appeared to be at her more recent baseline of 1.5-2.0. Her creatinine on 08/27/2019 was 1.82. Peak high-sensitivity troponins 3863. Her LDL 154.  Hemoglobin A1c 6.3. While she was ambulating with cardiac rehab her oxygen saturation dropped to 87% therefore she was discharged on supplemental oxygen, 3 L nasal cannula.  She presented to the clinic 09/10/2019 for follow-up evaluation and statedshe did not want to be on clopidogrel. She felt that being on blood thinner was too high of a risk. It was explained that she needed this medication to keep her diagonal lesion PTCA open. She indicated that she had occasional palpitations at night when laying down to go to sleep. On exam her heart rate and rhythm were regular. I increased her metoprolol to 25 mg twice daily due to elevated blood pressure and planned her follow-up with me in 1 month for reevaluation.   She presented to the emergency department on 09/12/2019 with complaints of shortness of breath and elevated blood pressure.  Her echocardiogram showed low normal LV function.  Her Lopressor was increased to 50 mg twice daily.  She was instructed to weigh herself daily.  Her creatinine has slightly increased from 1.79-1.83.  She was seen in follow-up by Dr. Stanford Breed on 10/28/2019 for her proximal atrial fibrillation.  She again refused anticoagulation.  She indicated that she had DOE, orthopnea and bilateral lower extremity edema.  She denied chest pain syncope.  Furosemide was added back into her medication regimen for lower extremity edema.  She presented to the clinic 11/05/2019 for follow-up evaluation and stated her breathing had not improved much.  She stated that her shoes were fitting much better and her pants /clothes were fitting much better.  However, she was concerned that she was losing so much weight.  She stated that she was drinking 15 cups of water per day and eating 5-6 times per day.  She did not understand why she was still losing weight.  We discussed the importance of limiting her fluid and weighing herself daily.  She expressed understanding that the fluid would be  impacting her breathing.  I  continued her current dose of furosemide, had her restrict her fluids, continued to weigh daily, avoid salt in her diet and planned follow-up as scheduled with Dr. Stanford Breed.  She contacted the nurse triage line on 11/11/2019 and indicated that she had continued shortness of breath and that her heart was out of rhythm.  She indicated that she was unable to do daily activities and felt awful.  She presents to the clinic today and states she has reduced her fluid consumption to 64 ounces daily. Her weight has maintained at 116 pounds. She states that she feels her heart going in and out of rhythm and feels like she is increasingly short of breath. She has been prescribed home oxygen which she states she does not like to use. She only uses her home oxygen when she feels bad. We discussed the need for her oxygen with her COPD. She expressed understanding. However, she states she does not wish to bring her oxygen out of her house. I have asked her to follow-up with her PCP and her oxygen prescriber so that she may obtain portable oxygen. She states that she felt much better when she was on her flecainide. We did review her EKG today and I showed her that she continues  to be in normal sinus rhythm at a normal rate. I will decrease her furosemide to 10 mg daily continue her fluid restriction and have encouraged her to maintain her low-sodium diet. She states that she will contact her PCP to discuss portable oxygen. I will have her follow-up in 3 months.  Shedenies chest pain, increased shortness of breath, lower extremity edema, fatigue, palpitations, melena, hematuria, hemoptysis, diaphoresis, weakness, presyncope, syncope, orthopnea, and PND.  Home Medications    Prior to Admission medications   Medication Sig Start Date End Date Taking? Authorizing Provider  amLODipine (NORVASC) 10 MG tablet Take 1 tablet (10 mg total) by mouth daily. 11/05/19   Plotnikov, Evie Lacks, MD  aspirin 81  MG chewable tablet Chew 1 tablet (81 mg total) by mouth daily. 08/29/19   Kathyrn Drown D, NP  Biotin 1000 MCG tablet Take 1,000 mcg by mouth daily.    [provider]  Cholecalciferol ([employer] VITAMIN D3) 1000 UNITS capsule Take 1,000 Units by mouth daily.      [provider]  clonazePAM (KLONOPIN) 0.5 MG tablet TAKE 1 TABLET BY MOUTH AT BEDTIME AS NEEDED FOR LEG CRAMPS Patient taking differently: Take 0.5 mg by mouth daily as needed (For leg cramps per patient).  02/05/19   Plotnikov, Evie Lacks, MD  clopidogrel (PLAVIX) 75 MG tablet Take 1 tablet (75 mg total) by mouth daily with breakfast. 08/29/19   Kathyrn Drown D, NP  cyanocobalamin 1000 MCG tablet Take 1,000 mcg by mouth daily.    [provider]  docusate sodium (COLACE) 100 MG capsule Take 1 capsule (100 mg total) by mouth 2 (two) times daily. 09/18/19   Lavina Hamman, MD  Fluticasone-Umeclidin-Vilant (TRELEGY ELLIPTA) 100-62.5-25 MCG/INH AEPB Inhale 1 Inhaler into the lungs daily. 10/13/19   Plotnikov, Evie Lacks, MD  furosemide (LASIX) 20 MG tablet Take 1 tablet (20 mg total) by mouth daily. 10/28/19 01/26/20  Lelon Perla, MD  isosorbide mononitrate (IMDUR) 30 MG 24 hr tablet Take 1 tablet (30 mg total) by mouth daily. 09/19/19   Lavina Hamman, MD  ketoconazole (NIZORAL) 2 % cream APPLY TOPICALLY TO THE AFFECTED AREA DAILY Patient taking differently: Apply 1 application topically daily as needed for irritation.  08/11/19   Plotnikov, Evie Lacks, MD  magic mouthwash w/lidocaine SOLN Take 5 mLs by mouth 4 (four) times daily as needed for mouth pain. 07/10/19   Plotnikov, Evie Lacks, MD  metoprolol tartrate (LOPRESSOR) 25 MG tablet Take 1 tablet (25 mg total) by mouth 2 (two) times daily. 09/10/19   Deberah Pelton, NP  nitroGLYCERIN (NITROSTAT) 0.4 MG SL tablet Place 1 tablet (0.4 mg total) under the tongue every 5 (five) minutes as needed for chest pain. 08/28/19 08/27/20  Kathyrn Drown D, NP  pantoprazole (PROTONIX) 40  MG tablet Take 1 tablet (40 mg total) by mouth daily. 02/05/19   Plotnikov, Evie Lacks, MD  rosuvastatin (CRESTOR) 10 MG tablet Take 1 tablet (10 mg total) by mouth daily. 09/19/19   Lavina Hamman, MD  tobramycin (TOBREX) 0.3 % ophthalmic solution Place 1 drop into both eyes every 6 (six) hours.  11/30/18   [provider]    Family History    Family History  Problem Relation Age of Onset  . Heart disease Father   . Arthritis Other        Family history of  . Coronary artery disease Other        Family history of  . Hypertension Other  Family history of  . Diabetes Daughter        55 died   She indicated that her mother is deceased. She indicated that her father is deceased. She indicated that her maternal grandmother is deceased. She indicated that her maternal grandfather is deceased. She indicated that her paternal grandmother is deceased. She indicated that her paternal grandfather is deceased. She indicated that the status of her daughter is unknown. She indicated that the status of her other is unknown.  Social History    Social History   Socioeconomic History  . Marital status: Divorced    Spouse name: Not on file  . Number of children: 2  . Years of education: Not on file  . Highest education level: Not on file  Occupational History  . Occupation: Airline pilot: [employer]  Tobacco Use  . Smoking status: Former Smoker    Packs/day: 1.00    Types: Cigarettes    Quit date: 05/29/2008    Years since quitting: 11.6  . Smokeless tobacco: Never Used  . Tobacco comment: pt unsure how many years she smoked  Vaping Use  . Vaping Use: Never used  Substance and Sexual Activity  . Alcohol use: No  . Drug use: No  . Sexual activity: Not Currently  Other Topics Concern  . Not on file  Social History Narrative   Retired, works part time   Divorced   Former Smoker   1- Son alcoholic abusive   Daughter died in May 30, 2006   Social Determinants of Health    Financial Resource Strain: High Risk  . Difficulty of Paying Living Expenses: Very hard  Food Insecurity: Food Insecurity Present  . Worried About Charity fundraiser in the Last Year: Often true  . Ran Out of Food in the Last Year: Often true  Transportation Needs: Unmet Transportation Needs  . Lack of Transportation (Medical): Yes  . Lack of Transportation (Non-Medical): Yes  Physical Activity: Unknown  . Days of Exercise per Week: Patient refused  . Minutes of Exercise per Session: Not on file  Stress: No Stress Concern Present  . Feeling of Stress : Not at all  Social Connections:   . Frequency of Communication with Friends and Family: Not on file  . Frequency of Social Gatherings with Friends and Family: Not on file  . Attends Religious Services: Not on file  . Active Member of Clubs or Organizations: Not on file  . Attends Archivist Meetings: Not on file  . Marital Status: Not on file  Intimate Partner Violence:   . Fear of Current or Ex-Partner: Not on file  . Emotionally Abused: Not on file  . Physically Abused: Not on file  . Sexually Abused: Not on file     Review of Systems    General:  No chills, fever, night sweats or weight changes.  Cardiovascular:  No chest pain, dyspnea on exertion, edema, orthopnea, palpitations, paroxysmal nocturnal dyspnea. Dermatological: No rash, lesions/masses Respiratory: No cough, dyspnea Urologic: No hematuria, dysuria Abdominal:   No nausea, vomiting, diarrhea, bright red blood per rectum, melena, or hematemesis Neurologic:  No visual changes, wkns, changes in mental status. All other systems reviewed and are otherwise negative except as noted above.  Physical Exam    VS:  BP 130/70   Pulse 78   Temp 97.8 F (36.6 C)   Ht 5\' 4"  (1.626 m)   Wt 116 lb 6.4 oz (52.8 kg)   SpO2 97%  BMI 19.98 kg/m  , BMI Body mass index is 19.98 kg/m. GEN: Well nourished, well developed, in no acute distress. HEENT:  normal. Neck: Supple, no JVD, carotid bruits, or masses. Cardiac: RRR, no murmurs, rubs, or gallops. No clubbing, cyanosis, edema.  Radials/DP/PT 2+ and equal bilaterally.  Respiratory:  Respirations regular and unlabored, clear to auscultation bilaterally. GI: Soft, nontender, nondistended, BS + x 4. MS: no deformity or atrophy. Skin: warm and dry, no rash. Neuro:  Strength and sensation are intact. Psych: Normal affect.  Accessory Clinical Findings    Recent Labs: 09/15/2019: B Natriuretic Peptide 2,581.2 09/16/2019: TSH 3.830 09/17/2019: ALT 16 09/18/2019: Hemoglobin 11.5; Magnesium 1.7; Platelets 224 11/07/2019: BUN 22; Creatinine, Ser 1.52; Potassium 4.2; Sodium 143   Recent Lipid Panel    Component Value Date/Time   CHOL 239 (H) 08/26/2019 1240   TRIG 155 (H) 08/26/2019 1240   HDL 54 08/26/2019 1240   CHOLHDL 4.4 08/26/2019 1240   VLDL 31 08/26/2019 1240   LDLCALC 154 (H) 08/26/2019 1240   LDLDIRECT 144.3 01/31/2011 0905    ECG personally reviewed by me today-normal sinus rhythm septal infarct undetermined age lateral infarct undetermined age 7 bpm- No acute changes  EKG6/25/2021 Sinus rhythm with first-degree AV block septal infarct undetermined age 33 bpm  Echocardiogram6/23/2021 IMPRESSIONS    1. Left ventricular ejection fraction, by estimation, is 60 to 65%. The  left ventricle has normal function. Left ventricular endocardial border  not optimally defined to evaluate regional wall motion. There is mild  concentric left ventricular  hypertrophy. Left ventricular diastolic function could not be evaluated.  2. Right ventricular systolic function is normal. The right ventricular  size is normal.  3. Left atrial size was moderately dilated.  4. The mitral valve is normal in structure. No evidence of mitral valve  regurgitation. No evidence of mitral stenosis.  5. The aortic valve is tricuspid. Aortic valve regurgitation is mild.   Cardiac  catheterization 08/26/2019  1st Diag lesion is 100% stenosed.  Balloon angioplasty was performed using a BALLOON SAPPHIRE 2.0X12.  Post intervention, there is a 50% residual stenosis.  Mid LAD lesion is 30% stenosed.  1. Total occlusion of the superior branch of the first diagonal with contrast staining, consistent with infarct vessel, treated with balloon angioplasty alone due to small vessel size and poor distal runoff 2. Calcified coronary arteries with no significant obstructive disease in the left main, left circumflex, LAD proper, or RCA 3. Minimally elevated LVEDP  Plan: ICU care, add clopidogrel when nausea subsides, risk reduction measures as tolerated. Unclear whether this is a de novo plaque rupture versus an embolic event. Patient does have known paroxysmal atrial fibrillation and has refused anticoagulation. Diagnostic Dominance: Right  Intervention   Echocardiogram 09/16/2019 IMPRESSIONS    1. Left ventricular ejection fraction, by estimation, is 50 to 55%. The  left ventricle has low normal function. The left ventricle has no regional  wall motion abnormalities. There is mild concentric left ventricular  hypertrophy. Left ventricular  diastolic function could not be evaluated.  2. Right ventricular systolic function is normal. The right ventricular  size is normal. Tricuspid regurgitation signal is inadequate for assessing  PA pressure.  3. Left atrial size was mildly dilated.  4. The mitral valve is normal in structure. Trivial mitral valve  regurgitation. No evidence of mitral stenosis.  5. The aortic valve is tricuspid. Aortic valve regurgitation is mild. No  aortic stenosis is present.  6. The inferior vena cava is dilated  in size with <50% respiratory  variability, suggesting right atrial pressure of 15 mmHg.   Assessment & Plan   1.  Acute on chronic diastolic CHF-continues to be euvolemic today.  Weight today 116.4 pounds and stable.      Echocardiogram 09/16/2019 showed LVEF of 43-15%, diastolic parameters could not be evaluated, mild left atrial dilation, trivial mitral valve regurgitation mild aortic valve regurgitation, and elevated right atrial pressures.  Denies dizziness, presyncope, syncope Reduce furosemide 10 mg daily Heart healthy low-sodium diet-salty 6 given-reviewed Increase physical activity as tolerated-instructed to use walker for safety reasons, not stability reasons.  -Reviewed Continue daily weights-contact office with a weight increase of 3 pounds overnight or 5 pounds in 1 week-compliant with daily weights. Continue fluid restriction  Coronary artery disease -no chest pain today. Lateral wall status post percutaneous coronary angioplasty to diagonal branch 08/26/2019. Echocardiogram showed LVEF 60-65%, suboptimal view for wall motion assessment, mild LVH, no valvular disease Continueaspirin, Plavix, atorvastatin, amlodipine, metoprolol, Imdur nitroglycerin Increase metoprolol to 25 mg twice daily Heart healthy low-sodium diet-salty 6 given Increase physical activity as tolerated   PAF-heart rate today regular 78 bpm. Continues with no episodes of palpitations. Continuemetoprolol, aspirin, Plavix Heart healthy low-sodium diet-salty 6 given Increase physical activity as tolerated  Essential hypertension-BP today 130/70. well-controlled at home. Holding ACE/ARB due to renal function. Continueamlodipine,Continue metoprolol  Heart healthy low-sodium diet-salty 6 given Increase physical activity as tolerated  Hyperlipidemia-goal less than 70. Low-dose rosuvastatin prescribed due to advanced age. Continue rosuvastatin 20 mg daily Heart healthy low-sodium high-fiber diet Increase physical activity as tolerated  COPD with chronic bronchitis-no increased work of breathing. Continues tonot use home oxygen.  States that she does not use home oxygen but was prescribed this.  97% on room  air Follow-up with pulmonary/PCP  Disposition: Follow-up with Dr. Stanford Breed or me in 3 months.    Jossie Ng. Stryder Poitra NP-C    11/14/2019, 12:18 PM [place] [place] [address] Office [phone] Fax [phone]  Notice: This dictation was prepared with Dragon dictation along with smaller phrase technology. Any transcriptional errors that result from this process are unintentional and may not be corrected upon review.

## 2019-11-14 ENCOUNTER — Ambulatory Visit (INDEPENDENT_AMBULATORY_CARE_PROVIDER_SITE_OTHER): Payer: Medicare Other | Admitting: General Practice

## 2019-11-14 ENCOUNTER — Encounter: Payer: Self-pay | Admitting: General Practice

## 2019-11-14 ENCOUNTER — Other Ambulatory Visit: Payer: Self-pay

## 2019-11-14 VITALS — BP 130/70 | HR 78 | Temp 97.8°F | Ht 64.0 in | Wt 116.4 lb

## 2019-11-14 DIAGNOSIS — E785 Hyperlipidemia, unspecified: Secondary | ICD-10-CM

## 2019-11-14 DIAGNOSIS — I480 Paroxysmal atrial fibrillation: Secondary | ICD-10-CM | POA: Diagnosis not present

## 2019-11-14 DIAGNOSIS — J449 Chronic obstructive pulmonary disease, unspecified: Secondary | ICD-10-CM | POA: Diagnosis not present

## 2019-11-14 DIAGNOSIS — I2510 Atherosclerotic heart disease of native coronary artery without angina pectoris: Secondary | ICD-10-CM

## 2019-11-14 DIAGNOSIS — I5031 Acute diastolic (congestive) heart failure: Secondary | ICD-10-CM | POA: Diagnosis not present

## 2019-11-14 NOTE — Patient Instructions (Signed)
Medication Instructions:  TAKE- Furosemide(Lasix) 10 mg by mouth every other day  *If you need a refill on your cardiac medications before your next appointment, please call your pharmacy*   Lab Work: None Ordered   Testing/Procedures: None Ordered   Follow-Up: At [facility], you and your health needs are our priority.  As part of our continuing mission to provide you with exceptional heart care, we have created designated Provider Care Teams.  These Care Teams include your primary Cardiologist (physician) and Advanced Practice Providers (APPs -  Physician Assistants and Nurse Practitioners) who all work together to provide you with the care you need, when you need it.  We recommend signing up for the patient portal called "MyChart".  Sign up information is provided on this After Visit Summary.  MyChart is used to connect with patients for Virtual Visits (Telemedicine).  Patients are able to view lab/test results, encounter notes, upcoming appointments, etc.  Non-urgent messages can be sent to your provider as well.   To learn more about what you can do with MyChart, go to [email or web address].    Your next appointment:   3 month(s)  The format for your next appointment:   In Person  Provider:   Kirk Ruths, MD

## 2019-11-18 ENCOUNTER — Other Ambulatory Visit: Payer: Self-pay | Admitting: Internal Medicine

## 2019-11-18 NOTE — Telephone Encounter (Signed)
    Patient requesting refill for clonazePAM (KLONOPIN) 0.5 MG tablet Pharmacy:[id number] - [place],  - [address] AT [place]

## 2019-11-19 NOTE — Telephone Encounter (Signed)
[place] Controlled Database Checked Last filled: 04/02/19 (90) LOV w/you: 10/13/19 Next appt w/you: 01/14/20

## 2019-11-24 ENCOUNTER — Telehealth: Payer: Self-pay | Admitting: Cardiology

## 2019-11-24 ENCOUNTER — Telehealth: Payer: Self-pay | Admitting: Internal Medicine

## 2019-11-24 MED ORDER — CLONAZEPAM 0.5 MG PO TABS: ORAL_TABLET | ORAL | 1 refills | Status: DC

## 2019-11-24 NOTE — Telephone Encounter (Signed)
Patient is requesting a refill on the Magic mouth wash. She states she has thrush again in her mouth and is unable to get eat.   [facility] [place], [place] AT [facility] [place] Phone:  [phone]  Fax:  [phone]

## 2019-11-24 NOTE — Telephone Encounter (Signed)
D/W Coletta Memos, FNP-C; continue fluid restriction (64 oz/day) and make sure to wear O2 all day. Call back if weight on home scale is 121#>greater (D/T kidney function). Call PCP about SOB and discuss O2 use.  Returned call to pt she states that her weight is 120# today and she states that she is SOB, she stated that she is NOT wearing her O2. She states that she will not wear her O2 as she does not want to become dependent. Her weight on 9-10 (at home) was 118#, here it was 116#.  In discussing Jesse's recommendations she stated that she IS wearing her O2 and it "does not help". She states will continue lasix and fluid restriction as directed and will CB if weight is 121# or greater. She states that she will call PCP for appt this or next week

## 2019-11-24 NOTE — Telephone Encounter (Signed)
Pt c/o swelling: STAT is pt has developed SOB within 24 hours  1) How much weight have you gained and in what time span? 2 pounds in 2 days  2) If swelling, where is the swelling located? feet  3) Are you currently taking a fluid pill? yes  4) Are you currently SOB? Yes, but she has been SOB for a while     5) Do you have a log of your daily weights (if so, list)?   6) Have you gained 3 pounds in a day or 5 pounds in a week?   7) Have you traveled recently? no  Patient called and said she feels like the fluid pill she is on is not reducing the swelling. The patient is still swollen and her feet hurt to touch. She feels like she has mucus stuck in her throat that is causing her to have a hard time breathing. She just wants to go back on all of her old medication    ?

## 2019-11-26 MED ORDER — MAGIC MOUTHWASH
5.0000 mL | Freq: Four times a day (QID) | ORAL | 1 refills | Status: DC
Start: 2019-11-26 — End: 2020-01-21

## 2019-11-26 NOTE — Telephone Encounter (Signed)
Okay.  Please fax.  Thanks

## 2019-11-27 NOTE — Telephone Encounter (Signed)
Faxed today

## 2019-11-30 DIAGNOSIS — J438 Other emphysema: Secondary | ICD-10-CM | POA: Diagnosis not present

## 2019-11-30 DIAGNOSIS — J449 Chronic obstructive pulmonary disease, unspecified: Secondary | ICD-10-CM | POA: Diagnosis not present

## 2019-12-03 ENCOUNTER — Encounter: Payer: Self-pay | Admitting: Internal Medicine

## 2019-12-03 ENCOUNTER — Ambulatory Visit (INDEPENDENT_AMBULATORY_CARE_PROVIDER_SITE_OTHER): Payer: Medicare Other | Admitting: Internal Medicine

## 2019-12-03 ENCOUNTER — Other Ambulatory Visit: Payer: Self-pay

## 2019-12-03 DIAGNOSIS — I480 Paroxysmal atrial fibrillation: Secondary | ICD-10-CM

## 2019-12-03 DIAGNOSIS — I5031 Acute diastolic (congestive) heart failure: Secondary | ICD-10-CM

## 2019-12-03 DIAGNOSIS — R609 Edema, unspecified: Secondary | ICD-10-CM

## 2019-12-03 DIAGNOSIS — I10 Essential (primary) hypertension: Secondary | ICD-10-CM | POA: Diagnosis not present

## 2019-12-03 MED ORDER — TORSEMIDE 20 MG PO TABS: 20.0000 mg | ORAL_TABLET | Freq: Every day | ORAL | 5 refills | Status: DC

## 2019-12-03 MED ORDER — AMLODIPINE BESYLATE 10 MG PO TABS: 5.0000 mg | ORAL_TABLET | Freq: Every day | ORAL | 3 refills | Status: DC

## 2019-12-03 MED ORDER — TRELEGY ELLIPTA 100-62.5-25 MCG/INH IN AEPB: 1.0000 | INHALATION_SPRAY | Freq: Every day | RESPIRATORY_TRACT | 3 refills | Status: AC

## 2019-12-03 NOTE — Assessment & Plan Note (Signed)
.   BP Readings from Last 3 Encounters:  12/03/19 (!) 148/80  11/14/19 130/70  11/05/19 132/70

## 2019-12-03 NOTE — Assessment & Plan Note (Signed)
NAS diet Stop Furosemide Start Torsemide 20 - 40 mg daily Reduce Amlodipine to 5 mg a day

## 2019-12-03 NOTE — Assessment & Plan Note (Signed)
On Plavix 

## 2019-12-03 NOTE — Patient Instructions (Signed)
Low salt diet Stop Furosemide Start Torsemide 20 - 40 mg daily Reduce Amlodipine to 5 mg a day

## 2019-12-03 NOTE — Assessment & Plan Note (Addendum)
Not better NAS diet D/c Furosemide Start Torsemide 20 - 40 mg daily

## 2019-12-03 NOTE — Progress Notes (Signed)
Subjective:  Patient ID: [name], female    DOB: 07-21-1935  Age: 84 y.o. MRN: [id number]  CC: No chief complaint on file.   HPI DENAY PLEITEZ presents for SOB and LE swelling - not better w/"water pills". C/o cough. Chest hurts. On O2 at home - no help... Trelegy, Lasix  is not helping...  Per Hx: "essential hypertension, paroxysmal atrial fibrillation (not on anticoagulation-patient refused), syncope, varicose veins of both lower extremities, CAD status post PCA, COPD with chronic bronchitis, acute sinusitis, CKD stage III, and emphysema.  She is admitted to the hospital on 08/26/2019 for acute lateral STEMI and underwent PTCA of the first diagonal which was100 %percent stenosed (reduced to 50%) no PCI was done due to the small caliber of the vessel and minimal downstream blood flow."   Outpatient Medications Prior to Visit  Medication Sig Dispense Refill  . amLODipine (NORVASC) 10 MG tablet Take 1 tablet (10 mg total) by mouth daily. 90 tablet 3  . aspirin 81 MG chewable tablet Chew 1 tablet (81 mg total) by mouth daily.    . Biotin 1000 MCG tablet Take 1,000 mcg by mouth daily.    . Cholecalciferol ([employer] VITAMIN D3) 1000 UNITS capsule Take 1,000 Units by mouth daily.      . clonazePAM (KLONOPIN) 0.5 MG tablet TAKE 1 TABLET BY MOUTH AT BEDTIME AS NEEDED FOR LEG CRAMPS 90 tablet 1  . clopidogrel (PLAVIX) 75 MG tablet Take 1 tablet (75 mg total) by mouth daily with breakfast. 60 tablet 2  . cyanocobalamin 1000 MCG tablet Take 1,000 mcg by mouth daily.    Marland Kitchen docusate sodium (COLACE) 100 MG capsule Take 1 capsule (100 mg total) by mouth 2 (two) times daily. 10 capsule 0  . Fluticasone-Umeclidin-Vilant (TRELEGY ELLIPTA) 100-62.5-25 MCG/INH AEPB Inhale 1 Inhaler into the lungs daily. 3 each 3  . furosemide (LASIX) 20 MG tablet Take 1 tablet (20 mg total) by mouth daily. 90 tablet 3  . isosorbide mononitrate (IMDUR) 30 MG 24 hr tablet Take 1 tablet (30 mg total) by mouth daily. 30  tablet 0  . ketoconazole (NIZORAL) 2 % cream APPLY TOPICALLY TO THE AFFECTED AREA DAILY (Patient taking differently: Apply 1 application topically daily as needed for irritation. ) 45 g 1  . magic mouthwash SOLN Take 5 mLs by mouth 4 (four) times daily. 200 mL 1  . magic mouthwash w/lidocaine SOLN Take 5 mLs by mouth 4 (four) times daily as needed for mouth pain. 240 mL 0  . metoprolol tartrate (LOPRESSOR) 25 MG tablet Take 1 tablet (25 mg total) by mouth 2 (two) times daily. 60 tablet 3  . nitroGLYCERIN (NITROSTAT) 0.4 MG SL tablet Place 1 tablet (0.4 mg total) under the tongue every 5 (five) minutes as needed for chest pain. 25 tablet 0  . pantoprazole (PROTONIX) 40 MG tablet Take 1 tablet (40 mg total) by mouth daily. 90 tablet 3  . rosuvastatin (CRESTOR) 10 MG tablet Take 1 tablet (10 mg total) by mouth daily. 30 tablet 0  . tobramycin (TOBREX) 0.3 % ophthalmic solution Place 1 drop into both eyes every 6 (six) hours.      No facility-administered medications prior to visit.    ROS: Review of Systems  Constitutional: Positive for fatigue. Negative for activity change, appetite change, chills and unexpected weight change.  HENT: Negative for congestion, mouth sores and sinus pressure.   Eyes: Negative for visual disturbance.  Respiratory: Positive for shortness of breath and wheezing. Negative  for cough and chest tightness.   Cardiovascular: Positive for chest pain, palpitations and leg swelling.  Gastrointestinal: Negative for abdominal distention, abdominal pain and nausea.  Genitourinary: Negative for difficulty urinating, frequency and vaginal pain.  Musculoskeletal: Negative for back pain and gait problem.  Skin: Negative for pallor and rash.  Neurological: Positive for weakness. Negative for dizziness, tremors, numbness and headaches.  Psychiatric/Behavioral: Negative for confusion and sleep disturbance.    Objective:  BP (!) 148/80 (BP Location: Left Arm, Patient Position:  Sitting, Cuff Size: Normal)   Pulse 85   Temp 98 F (36.7 C) (Oral)   Ht 5\' 4"  (1.626 m)   Wt 117 lb (53.1 kg)   SpO2 95%   BMI 20.08 kg/m   BP Readings from Last 3 Encounters:  12/03/19 (!) 148/80  11/14/19 130/70  11/05/19 132/70    Wt Readings from Last 3 Encounters:  12/03/19 117 lb (53.1 kg)  11/14/19 116 lb 6.4 oz (52.8 kg)  11/05/19 116 lb 3.2 oz (52.7 kg)    Physical Exam Constitutional:      General: She is not in acute distress.    Appearance: Normal appearance. She is well-developed.  HENT:     Head: Normocephalic.     Right Ear: External ear normal.     Left Ear: External ear normal.     Nose: Nose normal.  Eyes:     General:        Right eye: No discharge.        Left eye: No discharge.     Conjunctiva/sclera: Conjunctivae normal.     Pupils: Pupils are equal, round, and reactive to light.  Neck:     Thyroid: No thyromegaly.     Vascular: No JVD.     Trachea: No tracheal deviation.  Cardiovascular:     Rate and Rhythm: Normal rate and regular rhythm.     Heart sounds: Normal heart sounds.  Pulmonary:     Effort: No respiratory distress.     Breath sounds: No stridor. No wheezing.  Abdominal:     General: Bowel sounds are normal. There is no distension.     Palpations: Abdomen is soft. There is no mass.     Tenderness: There is no abdominal tenderness. There is no guarding or rebound.  Musculoskeletal:        General: Tenderness present.     Cervical back: Normal range of motion and neck supple.     Right lower leg: Edema present.     Left lower leg: Edema present.  Lymphadenopathy:     Cervical: No cervical adenopathy.  Skin:    Findings: No erythema or rash.  Neurological:     Mental Status: She is oriented to person, place, and time.     Cranial Nerves: No cranial nerve deficit.     Motor: No abnormal muscle tone.     Coordination: Coordination abnormal.     Gait: Gait abnormal.     Deep Tendon Reflexes: Reflexes normal.  Psychiatric:         Behavior: Behavior normal.        Thought Content: Thought content normal.        Judgment: Judgment normal.   trace to 1+ edema B feet Dysphoric   Lab Results  Component Value Date   WBC 12.2 (H) 09/18/2019   HGB 11.5 (L) 09/18/2019   HCT 35.3 (L) 09/18/2019   PLT 224 09/18/2019   GLUCOSE 98 11/07/2019   CHOL 239 (H)  08/26/2019   TRIG 155 (H) 08/26/2019   HDL 54 08/26/2019   LDLDIRECT 144.3 01/31/2011   LDLCALC 154 (H) 08/26/2019   ALT 16 09/17/2019   AST 25 09/17/2019   NA 143 11/07/2019   K 4.2 11/07/2019   CL 96 11/07/2019   CREATININE 1.52 (H) 11/07/2019   BUN 22 11/07/2019   CO2 28 11/07/2019   TSH 3.830 09/16/2019   INR 1.0 09/15/2019   HGBA1C 6.3 (H) 08/26/2019    DG Chest 2 View  Result Date: 09/15/2019 CLINICAL DATA:  Shortness of breath EXAM: CHEST - 2 VIEW COMPARISON:  09/12/2019 FINDINGS: Trace pleural effusions. Bibasilar atelectasis. Chronic interstitial prominence. No pneumothorax. Top normal heart size. No acute osseous abnormality. Diffuse calcification of the visualized aorta. IMPRESSION: Persistent trace pleural effusions and bibasilar atelectasis. Electronically Signed   By: Macy Mis M.D.   On: 09/15/2019 14:14   CT Head Wo Contrast  Result Date: 09/15/2019 CLINICAL DATA:  Focal neurological deficit, stroke suspected EXAM: CT HEAD WITHOUT CONTRAST TECHNIQUE: Contiguous axial images were obtained from the base of the skull through the vertex without intravenous contrast. COMPARISON:  08/13/2016 head CT. FINDINGS: Brain: No acute infarct or intracranial hemorrhage. No mass lesion. No midline shift or extra-axial fluid collection. Mild parenchymal volume loss with ex vacuo dilatation is unchanged. Vascular: No hyperdense vessel. Bilateral carotid siphon and V4 segment atherosclerotic calcifications. Skull: Negative for fracture or focal lesion. Sinuses/Orbits: Normal orbits. Clear paranasal sinuses. No mastoid effusion. Other: None. IMPRESSION: No  acute intracranial process. Mild cerebral atrophy, similar to prior exam and within normal limits for patient's age. Electronically Signed   By: Primitivo Gauze M.D.   On: 09/15/2019 14:09   ECHOCARDIOGRAM COMPLETE  Result Date: 09/16/2019    ECHOCARDIOGRAM REPORT   Patient Name:   ALMADELIA LOOMAN Date of Exam: 09/16/2019 Medical Rec #:  [id number]     Height:       64.0 in Accession #:    [id number]    Weight:       125.7 lb Date of Birth:  February 02, 1936     BSA:          1.606 m Patient Age:    49 years      BP:           128/57 mmHg Patient Gender: F             HR:           80 bpm. Exam Location:  Inpatient Procedure: 2D Echo, Cardiac Doppler and Color Doppler Indications:    I50.33 Acute on chronic diastolic (congestive) heart failure  History:        Patient has prior history of Echocardiogram examinations, most                 recent 08/27/2019. COPD, Arrythmias:Atrial Fibrillation; Risk                 Factors:Hypertension and GERD.  Sonographer:    Jonelle Sidle Dance Referring Phys: [id number] [place]  1. Left ventricular ejection fraction, by estimation, is 50 to 55%. The left ventricle has low normal function. The left ventricle has no regional wall motion abnormalities. There is mild concentric left ventricular hypertrophy. Left ventricular diastolic function could not be evaluated.  2. Right ventricular systolic function is normal. The right ventricular size is normal. Tricuspid regurgitation signal is inadequate for assessing PA pressure.  3. Left atrial size was mildly dilated.  4. The mitral  valve is normal in structure. Trivial mitral valve regurgitation. No evidence of mitral stenosis.  5. The aortic valve is tricuspid. Aortic valve regurgitation is mild. No aortic stenosis is present.  6. The inferior vena cava is dilated in size with <50% respiratory variability, suggesting right atrial pressure of 15 mmHg. FINDINGS  Left Ventricle: Left ventricular ejection fraction, by  estimation, is 50 to 55%. The left ventricle has low normal function. The left ventricle has no regional wall motion abnormalities. The left ventricular internal cavity size was normal in size. There is mild concentric left ventricular hypertrophy. Left ventricular diastolic function could not be evaluated due to atrial fibrillation. Left ventricular diastolic function could not be evaluated. Right Ventricle: The right ventricular size is normal. No increase in right ventricular wall thickness. Right ventricular systolic function is normal. Tricuspid regurgitation signal is inadequate for assessing PA pressure. Left Atrium: Left atrial size was mildly dilated. Right Atrium: Right atrial size was normal in size. Pericardium: A small pericardial effusion is present. The pericardial effusion is circumferential. Mitral Valve: The mitral valve is normal in structure. Mild to moderate mitral annular calcification. Trivial mitral valve regurgitation. No evidence of mitral valve stenosis. Tricuspid Valve: The tricuspid valve is normal in structure. Tricuspid valve regurgitation is trivial. No evidence of tricuspid stenosis. Aortic Valve: The aortic valve is tricuspid. . There is mild thickening and mild calcification of the aortic valve. Aortic valve regurgitation is mild. Aortic regurgitation PHT measures 237 msec. No aortic stenosis is present. There is mild thickening of  the aortic valve. There is mild calcification of the aortic valve. Pulmonic Valve: The pulmonic valve was grossly normal. Pulmonic valve regurgitation is mild. No evidence of pulmonic stenosis. Aorta: The aortic root and ascending aorta are structurally normal, with no evidence of dilitation. Venous: The inferior vena cava is dilated in size with less than 50% respiratory variability, suggesting right atrial pressure of 15 mmHg. IAS/Shunts: The atrial septum is grossly normal. Additional Comments: There is a small pleural effusion in both left and right  lateral regions.  LEFT VENTRICLE PLAX 2D LVIDd:         3.54 cm LVIDs:         2.70 cm LV PW:         1.30 cm LV IVS:        1.50 cm LVOT diam:     1.80 cm LV SV:         32 LV SV Index:   20 LVOT Area:     2.54 cm  RIGHT VENTRICLE             IVC RV Basal diam:  2.70 cm     IVC diam: 2.30 cm RV S prime:     10.10 cm/s TAPSE (M-mode): 1.8 cm LEFT ATRIUM             Index       RIGHT ATRIUM           Index LA diam:        4.40 cm 2.74 cm/m  RA Area:     10.50 cm LA Vol (A2C):   39.1 ml 24.35 ml/m RA Volume:   21.10 ml  13.14 ml/m LA Vol (A4C):   32.3 ml 20.12 ml/m LA Biplane Vol: 36.4 ml 22.67 ml/m  AORTIC VALVE LVOT Vmax:   66.55 cm/s LVOT Vmean:  43.900 cm/s LVOT VTI:    0.126 m AI PHT:      237 msec  AORTA Ao Root diam: 3.00 cm Ao Asc diam:  3.50 cm MITRAL VALVE MV Area (PHT): 2.48 cm    SHUNTS MV Decel Time: 306 msec    Systemic VTI:  0.13 m MV E velocity: 36.80 cm/s  Systemic Diam: 1.80 cm MV A velocity: 34.90 cm/s MV E/A ratio:  1.05 Buford Dresser MD Electronically signed by Buford Dresser MD Signature Date/Time: 09/16/2019/10:36:43 PM    Final    VAS [place] UPPER EXTREMITY VENOUS DUPLEX  Result Date: 09/16/2019 UPPER VENOUS STUDY  Indications: Pain, and Swelling Limitations: Pain. Comparison Study: No prior study Performing Technologist: Maudry Mayhew MHA, RDMS, RVT, RDCS  Examination Guidelines: A complete evaluation includes B-mode imaging, spectral Doppler, color Doppler, and power Doppler as needed of all accessible portions of each vessel. Bilateral testing is considered an integral part of a complete examination. Limited examinations for reoccurring indications may be performed as noted.  Right Findings: +----------+------------+---------+-----------+----------+--------------+ RIGHT     CompressiblePhasicitySpontaneousProperties   Summary     +----------+------------+---------+-----------+----------+--------------+ IJV           Full       Yes       Yes                              +----------+------------+---------+-----------+----------+--------------+ Subclavian    Full       Yes       Yes                             +----------+------------+---------+-----------+----------+--------------+ Axillary      Full       Yes       Yes                             +----------+------------+---------+-----------+----------+--------------+ Brachial      Full       Yes       Yes                             +----------+------------+---------+-----------+----------+--------------+ Radial        Full                                                 +----------+------------+---------+-----------+----------+--------------+ Ulnar         Full                                                 +----------+------------+---------+-----------+----------+--------------+ Cephalic      Full                                                 +----------+------------+---------+-----------+----------+--------------+ Basilic                                             Not visualized +----------+------------+---------+-----------+----------+--------------+  Left  Findings: +----------+------------+---------+-----------+----------+--------------+ LEFT      CompressiblePhasicitySpontaneousProperties   Summary     +----------+------------+---------+-----------+----------+--------------+ Subclavian                                          Not visualized +----------+------------+---------+-----------+----------+--------------+  Summary:  Right: No evidence of deep vein thrombosis in the upper extremity. No evidence of superficial vein thrombosis in the upper extremity.  *See table(s) above for measurements and observations.  Diagnosing physician: Harold Barban MD Electronically signed by Harold Barban MD on 09/16/2019 at 4:19:31 PM.    Final     Assessment & Plan:   There are no diagnoses linked to this encounter.   No orders of the defined types  were placed in this encounter.    Follow-up: No follow-ups on file.  Walker Kehr, MD

## 2019-12-05 ENCOUNTER — Ambulatory Visit: Payer: Medicare Other | Admitting: Cardiology

## 2019-12-08 ENCOUNTER — Ambulatory Visit: Payer: Medicare Other | Admitting: Cardiology

## 2019-12-09 ENCOUNTER — Telehealth: Payer: Self-pay | Admitting: Internal Medicine

## 2019-12-09 NOTE — Telephone Encounter (Signed)
[facility] Report/Call : Caller states, pt cannot stand without dizziness. This is ongoing issue. Pt has fluctuation bp 160/68.  [facility] Nurse called patient and left message.

## 2019-12-11 ENCOUNTER — Telehealth: Payer: Self-pay | Admitting: Internal Medicine

## 2019-12-11 NOTE — Telephone Encounter (Signed)
Patients sister called the office and states that the patient cannot get up and go to the rest room without being dizzy, or feeling like she will pass out. The sister also said that the patient is crying and in distress, and she cannot get dressed. Amy called EMS to go to the patients home.

## 2019-12-11 NOTE — Telephone Encounter (Signed)
EMS followed up. Paramedics made two attempts to get [name] to open the door and let them assess her.  She would not, stating she could not afford them.

## 2019-12-12 ENCOUNTER — Telehealth: Payer: Self-pay | Admitting: Cardiology

## 2019-12-12 NOTE — Telephone Encounter (Signed)
Noted.  Please advised to take the patient to ER if problems.  Thanks

## 2019-12-12 NOTE — Telephone Encounter (Signed)
Spoke to patient she stated she feels awful.Stated she has dizziness every time she gets up.Stated she gets up [place] she still has dizziness.She has not blacked out.Stated she is weak.She has to sit down to make a sandwich.She has been feeling this way over a month.Stated she has seen our PA and her PCP recently and nobody can find out what is wrong.Stated she can't keep living this way.Appointment scheduled with Dr.Crenshaw 12/29/19 at 3:20 pm.Advised to bring a list of all medications to appointment.

## 2019-12-12 NOTE — Telephone Encounter (Signed)
Pt c/o medication issue:  1. Name of Medication: pt is not sure exactly what medication it was , but she thinks it is a blood thinner   2. How are you currently taking this medication (dosage and times per day)? Pt did not say  3. Are you having a reaction (difficulty breathing--STAT)? SOB  4. What is your medication issue? Patient states she has no energy whatsoever. All she can do is go from the bed to the chair  Pt c/o Shortness Of Breath: STAT if SOB developed within the last 24 hours or pt is noticeably SOB on the phone  1. Are you currently SOB (can you hear that pt is SOB on the phone)? no  2. How long have you been experiencing SOB?   3. Are you SOB when sitting or when up moving around? When moving around  4. Are you currently experiencing any other symptoms? Fatigue, weakness, weight loss

## 2019-12-17 ENCOUNTER — Telehealth: Payer: Self-pay | Admitting: Internal Medicine

## 2019-12-17 NOTE — Telephone Encounter (Addendum)
    Patient calling to report BP 132/55, fatigue and  SOB  Call transferred to [facility]  Patient spoke with [facility], patient declined to schedule an appointment

## 2019-12-19 DIAGNOSIS — H353114 Nonexudative age-related macular degeneration, right eye, advanced atrophic with subfoveal involvement: Secondary | ICD-10-CM | POA: Diagnosis not present

## 2019-12-19 DIAGNOSIS — H353223 Exudative age-related macular degeneration, left eye, with inactive scar: Secondary | ICD-10-CM | POA: Diagnosis not present

## 2019-12-23 DIAGNOSIS — H35371 Puckering of macula, right eye: Secondary | ICD-10-CM | POA: Diagnosis not present

## 2019-12-23 DIAGNOSIS — H353114 Nonexudative age-related macular degeneration, right eye, advanced atrophic with subfoveal involvement: Secondary | ICD-10-CM | POA: Diagnosis not present

## 2019-12-23 DIAGNOSIS — H353223 Exudative age-related macular degeneration, left eye, with inactive scar: Secondary | ICD-10-CM | POA: Diagnosis not present

## 2019-12-23 NOTE — Progress Notes (Deleted)
HPI: FU CAD and paroxysmal atrial fibrillation. A previous monitor revealed paroxysmal atrial fibrillation/flutter with a rapid ventricular response. Note she refuses anticoagulation. A CardioNet was performed in Feb 2012 secondary to palpitations and revealed sinus rhythm with PACs and rare PVCs.  Patient had acute lateral myocardial infarction June 2021.  Cardiac catheterization revealed an occluded first diagonal and 30% mid LAD.  Patient had balloon angioplasty due to small vessel size. Readmitted with hypertensive urgency and congestive heart failure July 2021.  She was diuresed but creatinine increased and Lasix was discontinued.  Her blood pressure medications were adjusted.  Follow-up echocardiogram July 2021 showed ejection fraction 50 to 55%, mild left atrial enlargement and mild aortic insufficiency.  Since I last saw her,  Current Outpatient Medications  Medication Sig Dispense Refill  . amLODipine (NORVASC) 10 MG tablet Take 0.5 tablets (5 mg total) by mouth daily. 90 tablet 3  . aspirin 81 MG chewable tablet Chew 1 tablet (81 mg total) by mouth daily.    . Biotin 1000 MCG tablet Take 1,000 mcg by mouth daily.    . Cholecalciferol ([employer] VITAMIN D3) 1000 UNITS capsule Take 1,000 Units by mouth daily.      . clonazePAM (KLONOPIN) 0.5 MG tablet TAKE 1 TABLET BY MOUTH AT BEDTIME AS NEEDED FOR LEG CRAMPS 90 tablet 1  . clopidogrel (PLAVIX) 75 MG tablet Take 1 tablet (75 mg total) by mouth daily with breakfast. 60 tablet 2  . cyanocobalamin 1000 MCG tablet Take 1,000 mcg by mouth daily.    Marland Kitchen docusate sodium (COLACE) 100 MG capsule Take 1 capsule (100 mg total) by mouth 2 (two) times daily. 10 capsule 0  . Fluticasone-Umeclidin-Vilant (TRELEGY ELLIPTA) 100-62.5-25 MCG/INH AEPB Inhale 1 Inhaler into the lungs daily. 3 each 3  . isosorbide mononitrate (IMDUR) 30 MG 24 hr tablet Take 1 tablet (30 mg total) by mouth daily. 30 tablet 0  . ketoconazole (NIZORAL) 2 % cream APPLY TOPICALLY TO  THE AFFECTED AREA DAILY (Patient taking differently: Apply 1 application topically daily as needed for irritation. ) 45 g 1  . magic mouthwash SOLN Take 5 mLs by mouth 4 (four) times daily. 200 mL 1  . magic mouthwash w/lidocaine SOLN Take 5 mLs by mouth 4 (four) times daily as needed for mouth pain. 240 mL 0  . metoprolol tartrate (LOPRESSOR) 25 MG tablet Take 1 tablet (25 mg total) by mouth 2 (two) times daily. 60 tablet 3  . nitroGLYCERIN (NITROSTAT) 0.4 MG SL tablet Place 1 tablet (0.4 mg total) under the tongue every 5 (five) minutes as needed for chest pain. 25 tablet 0  . pantoprazole (PROTONIX) 40 MG tablet Take 1 tablet (40 mg total) by mouth daily. 90 tablet 3  . rosuvastatin (CRESTOR) 10 MG tablet Take 1 tablet (10 mg total) by mouth daily. 30 tablet 0  . tobramycin (TOBREX) 0.3 % ophthalmic solution Place 1 drop into both eyes every 6 (six) hours.     . torsemide (DEMADEX) 20 MG tablet Take 1-2 tablets (20-40 mg total) by mouth daily. 60 tablet 5   No current facility-administered medications for this visit.     Past Medical History:  Diagnosis Date  . Anxiety   . Arthritis   . Atrial fibrillation ([place])   . B12 deficiency   . COPD (chronic obstructive pulmonary disease) ([place])   . Depression   . GERD (gastroesophageal reflux disease)   . Hypertension   . Osteoarthrosis, hand    both  hands  . Peptic ulcer, unspecified site, unspecified as acute or chronic, without mention of hemorrhage, perforation, or obstruction   . Pneumonia   . PONV (postoperative nausea and vomiting)   . Vitamin D deficiency disease     Past Surgical History:  Procedure Laterality Date  . BLADDER SURGERY     Bladder tack and intestines  . CORONARY/GRAFT ACUTE MI REVASCULARIZATION N/A 08/26/2019   Procedure: Coronary/Graft Acute MI Revascularization;  Surgeon: Sherren Mocha, MD;  Location: [facility];  Service: Cardiovascular;  Laterality: N/A;  . CYSTOCELE REPAIR    . CYSTOSCOPY WITH  RETROGRADE PYELOGRAM, URETEROSCOPY AND STENT PLACEMENT Right 09/11/2016   Procedure: CYSTOSCOPY WITH RETROGRADE PYELOGRAM, URETEROSCOPY AND STENT REPLACEMENT;  Surgeon: Cleon Gustin, MD;  Location: [facility];  Service: Urology;  Laterality: Right;  . CYSTOSCOPY WITH STENT PLACEMENT Right 08/11/2016   Procedure: CYSTOSCOPY, URETERSCOPY,  RIGHT RETROGRADE WITH RIGHT STENT PLACEMENT;  Surgeon: Festus Aloe, MD;  Location: [facility];  Service: Urology;  Laterality: Right;  . SPLENECTOMY      Social History   Socioeconomic History  . Marital status: Divorced    Spouse name: Not on file  . Number of children: 2  . Years of education: Not on file  . Highest education level: Not on file  Occupational History  . Occupation: Airline pilot: [employer]  Tobacco Use  . Smoking status: Former Smoker    Packs/day: 1.00    Types: Cigarettes    Quit date: June 07, 2008    Years since quitting: 11.8  . Smokeless tobacco: Never Used  . Tobacco comment: pt unsure how many years she smoked  Vaping Use  . Vaping Use: Never used  Substance and Sexual Activity  . Alcohol use: No  . Drug use: No  . Sexual activity: Not Currently  Other Topics Concern  . Not on file  Social History Narrative   Retired, works part time   Divorced   Former Smoker   1- Son alcoholic abusive   Daughter died in 2006/06/08   Social Determinants of Health   Financial Resource Strain: High Risk  . Difficulty of Paying Living Expenses: Very hard  Food Insecurity: Food Insecurity Present  . Worried About Charity fundraiser in the Last Year: Often true  . Ran Out of Food in the Last Year: Often true  Transportation Needs: Unmet Transportation Needs  . Lack of Transportation (Medical): Yes  . Lack of Transportation (Non-Medical): Yes  Physical Activity: Unknown  . Days of Exercise per Week: Patient refused  . Minutes of Exercise per Session: Not on file  Stress: No Stress Concern Present  . Feeling of Stress : Not at all    Social Connections:   . Frequency of Communication with Friends and Family: Not on file  . Frequency of Social Gatherings with Friends and Family: Not on file  . Attends Religious Services: Not on file  . Active Member of Clubs or Organizations: Not on file  . Attends Archivist Meetings: Not on file  . Marital Status: Not on file  Intimate Partner Violence:   . Fear of Current or Ex-Partner: Not on file  . Emotionally Abused: Not on file  . Physically Abused: Not on file  . Sexually Abused: Not on file    Family History  Problem Relation Age of Onset  . Heart disease Father   . Arthritis Other        Family history of  .  Coronary artery disease Other        Family history of  . Hypertension Other        Family history of  . Diabetes Daughter        3 died    ROS: no fevers or chills, productive cough, hemoptysis, dysphasia, odynophagia, melena, hematochezia, dysuria, hematuria, rash, seizure activity, orthopnea, PND, pedal edema, claudication. Remaining systems are negative.  Physical Exam: Well-developed well-nourished in no acute distress.  Skin is warm and dry.  HEENT is normal.  Neck is supple.  Chest is clear to auscultation with normal expansion.  Cardiovascular exam is regular rate and rhythm.  Abdominal exam nontender or distended. No masses palpated. Extremities show no edema. neuro grossly intact  ECG- personally reviewed  A/P  1 chronic diastolic congestive heart failure-patient's volume status has improved compared to previous office visit.  Continue Lasix at present dose.  Check potassium and renal function.  2 coronary artery disease-no chest pain.  Continue aspirin, Plavix and statin.  3 paroxysmal atrial fibrillation-patient remains in sinus rhythm.  Continue beta-blocker.  She has declined anticoagulation in the past and understands the higher risk of CVA.  4 hypertension-blood pressure controlled.  Continue present medications and  follow.  5 hyperlipidemia-continue statin.  6 chronic stage III kidney disease-plan to recheck BUN and creatinine today.  Kirk Ruths, MD

## 2019-12-26 DIAGNOSIS — R300 Dysuria: Secondary | ICD-10-CM | POA: Diagnosis not present

## 2019-12-26 DIAGNOSIS — R3982 Chronic bladder pain: Secondary | ICD-10-CM | POA: Diagnosis not present

## 2019-12-29 ENCOUNTER — Ambulatory Visit: Payer: Medicare Other | Admitting: Cardiology

## 2019-12-30 DIAGNOSIS — J449 Chronic obstructive pulmonary disease, unspecified: Secondary | ICD-10-CM | POA: Diagnosis not present

## 2019-12-30 DIAGNOSIS — J438 Other emphysema: Secondary | ICD-10-CM | POA: Diagnosis not present

## 2020-01-14 ENCOUNTER — Encounter: Payer: Self-pay | Admitting: Internal Medicine

## 2020-01-14 ENCOUNTER — Ambulatory Visit (INDEPENDENT_AMBULATORY_CARE_PROVIDER_SITE_OTHER): Payer: Medicare Other | Admitting: Internal Medicine

## 2020-01-14 ENCOUNTER — Other Ambulatory Visit: Payer: Self-pay

## 2020-01-14 VITALS — BP 148/70 | HR 82 | Temp 97.8°F | Wt 120.4 lb

## 2020-01-14 DIAGNOSIS — F411 Generalized anxiety disorder: Secondary | ICD-10-CM

## 2020-01-14 DIAGNOSIS — R609 Edema, unspecified: Secondary | ICD-10-CM | POA: Diagnosis not present

## 2020-01-14 DIAGNOSIS — Z23 Encounter for immunization: Secondary | ICD-10-CM | POA: Diagnosis not present

## 2020-01-14 DIAGNOSIS — I5031 Acute diastolic (congestive) heart failure: Secondary | ICD-10-CM

## 2020-01-14 DIAGNOSIS — J449 Chronic obstructive pulmonary disease, unspecified: Secondary | ICD-10-CM

## 2020-01-14 DIAGNOSIS — I480 Paroxysmal atrial fibrillation: Secondary | ICD-10-CM

## 2020-01-14 MED ORDER — METHYLPREDNISOLONE 4 MG PO TBPK
ORAL_TABLET | ORAL | 0 refills | Status: DC
Start: 2020-01-14 — End: 2020-02-13

## 2020-01-14 MED ORDER — TORSEMIDE 100 MG PO TABS: 100.0000 mg | ORAL_TABLET | Freq: Every day | ORAL | 1 refills | Status: DC

## 2020-01-14 MED ORDER — ROSUVASTATIN CALCIUM 10 MG PO TABS: 10.0000 mg | ORAL_TABLET | Freq: Every day | ORAL | 3 refills | Status: DC

## 2020-01-14 MED ORDER — CLOPIDOGREL BISULFATE 75 MG PO TABS: 75.0000 mg | ORAL_TABLET | Freq: Every day | ORAL | 3 refills | Status: DC

## 2020-01-14 MED ORDER — METOPROLOL TARTRATE 25 MG PO TABS: 25.0000 mg | ORAL_TABLET | Freq: Two times a day (BID) | ORAL | 3 refills | Status: DC

## 2020-01-14 NOTE — Assessment & Plan Note (Signed)
  RTC 1 wk

## 2020-01-14 NOTE — Assessment & Plan Note (Signed)
Clonazepam prn 

## 2020-01-14 NOTE — Assessment & Plan Note (Signed)
D/c Norvasc

## 2020-01-14 NOTE — Patient Instructions (Signed)
Stop Amlodipine Start Demadex 100 mg daily Take Medrol pack

## 2020-01-14 NOTE — Progress Notes (Signed)
Subjective:  Patient ID: [name], female    DOB: 1935-10-10  Age: 84 y.o. MRN: [id number]  CC: Follow-up (3 Month F/U- Flu shot)   HPI [name] presents for c/o DOE - worse, leg swelling. I feel awfull! Pt was sch to see Dr Stanford Breed on 10/25 and did not go..total hip arthroplasty pt is giving very vague answers to med questions. She has not reduced amlodipine dose yet.Marland KitchenMarland KitchenDemadex - taking one 20 mg a day  Outpatient Medications Prior to Visit  Medication Sig Dispense Refill  . amLODipine (NORVASC) 10 MG tablet Take 0.5 tablets (5 mg total) by mouth daily. 90 tablet 3  . aspirin 81 MG chewable tablet Chew 1 tablet (81 mg total) by mouth daily.    . Biotin 1000 MCG tablet Take 1,000 mcg by mouth daily.    . Cholecalciferol ([employer]S VITAMIN D3) 1000 UNITS capsule Take 1,000 Units by mouth daily.      . clonazePAM (KLONOPIN) 0.5 MG tablet TAKE 1 TABLET BY MOUTH AT BEDTIME AS NEEDED FOR LEG CRAMPS 90 tablet 1  . clopidogrel (PLAVIX) 75 MG tablet Take 1 tablet (75 mg total) by mouth daily with breakfast. 60 tablet 2  . cyanocobalamin 1000 MCG tablet Take 1,000 mcg by mouth daily.    Marland Kitchen docusate sodium (COLACE) 100 MG capsule Take 1 capsule (100 mg total) by mouth 2 (two) times daily. 10 capsule 0  . Fluticasone-Umeclidin-Vilant (TRELEGY ELLIPTA) 100-62.5-25 MCG/INH AEPB Inhale 1 Inhaler into the lungs daily. 3 each 3  . isosorbide mononitrate (IMDUR) 30 MG 24 hr tablet Take 1 tablet (30 mg total) by mouth daily. 30 tablet 0  . ketoconazole (NIZORAL) 2 % cream APPLY TOPICALLY TO THE AFFECTED AREA DAILY (Patient taking differently: Apply 1 application topically daily as needed for irritation. ) 45 g 1  . magic mouthwash SOLN Take 5 mLs by mouth 4 (four) times daily. 200 mL 1  . magic mouthwash w/lidocaine SOLN Take 5 mLs by mouth 4 (four) times daily as needed for mouth pain. 240 mL 0  . metoprolol tartrate (LOPRESSOR) 25 MG tablet Take 1 tablet (25 mg total) by mouth 2 (two) times daily. 60  tablet 3  . nitroGLYCERIN (NITROSTAT) 0.4 MG SL tablet Place 1 tablet (0.4 mg total) under the tongue every 5 (five) minutes as needed for chest pain. 25 tablet 0  . pantoprazole (PROTONIX) 40 MG tablet Take 1 tablet (40 mg total) by mouth daily. 90 tablet 3  . rosuvastatin (CRESTOR) 10 MG tablet Take 1 tablet (10 mg total) by mouth daily. 30 tablet 0  . tobramycin (TOBREX) 0.3 % ophthalmic solution Place 1 drop into both eyes every 6 (six) hours.     . torsemide (DEMADEX) 20 MG tablet Take 1-2 tablets (20-40 mg total) by mouth daily. 60 tablet 5   No facility-administered medications prior to visit.    ROS: Review of Systems  Constitutional: Positive for fatigue. Negative for activity change, appetite change, chills and unexpected weight change.  HENT: Positive for hearing loss. Negative for congestion, mouth sores and sinus pressure.   Eyes: Negative for visual disturbance.  Respiratory: Positive for shortness of breath. Negative for cough and chest tightness.   Cardiovascular: Positive for leg swelling. Negative for chest pain.  Gastrointestinal: Negative for abdominal pain and nausea.  Genitourinary: Negative for difficulty urinating, frequency and vaginal pain.  Musculoskeletal: Negative for back pain and gait problem.  Skin: Negative for pallor and rash.  Neurological: Positive for weakness.  Negative for dizziness, tremors, numbness and headaches.  Psychiatric/Behavioral: Positive for decreased concentration. Negative for confusion and sleep disturbance.    Objective:  BP (!) 148/70 (BP Location: Left Arm)   Pulse 82   Temp 97.8 F (36.6 C) (Oral)   Wt 120 lb 6.4 oz (54.6 kg)   SpO2 92%   BMI 20.67 kg/m   BP Readings from Last 3 Encounters:  01/14/20 (!) 148/70  12/03/19 (!) 148/80  11/14/19 130/70    Wt Readings from Last 3 Encounters:  01/14/20 120 lb 6.4 oz (54.6 kg)  12/03/19 117 lb (53.1 kg)  11/14/19 116 lb 6.4 oz (52.8 kg)    Physical Exam Constitutional:       General: She is not in acute distress.    Appearance: She is well-developed.  HENT:     Head: Normocephalic.     Right Ear: External ear normal.     Left Ear: External ear normal.     Nose: Nose normal.  Eyes:     General:        Right eye: No discharge.        Left eye: No discharge.     Conjunctiva/sclera: Conjunctivae normal.     Pupils: Pupils are equal, round, and reactive to light.  Neck:     Thyroid: No thyromegaly.     Vascular: No JVD.     Trachea: No tracheal deviation.  Cardiovascular:     Rate and Rhythm: Normal rate and regular rhythm.     Heart sounds: Normal heart sounds.  Pulmonary:     Effort: No respiratory distress.     Breath sounds: No stridor. Rales present. No wheezing.  Abdominal:     General: Bowel sounds are normal. There is no distension.     Palpations: Abdomen is soft. There is no mass.     Tenderness: There is no abdominal tenderness. There is no guarding or rebound.  Musculoskeletal:        General: Tenderness present.     Cervical back: Normal range of motion and neck supple.     Right lower leg: Edema present.     Left lower leg: Edema present.  Lymphadenopathy:     Cervical: No cervical adenopathy.  Skin:    Findings: No erythema or rash.  Neurological:     Cranial Nerves: No cranial nerve deficit.     Motor: No abnormal muscle tone.     Coordination: Coordination normal.     Deep Tendon Reflexes: Reflexes normal.  Psychiatric:        Behavior: Behavior normal.        Thought Content: Thought content normal.        Judgment: Judgment normal.   1-2+ edema B ankles decr BS at bases B    A total time of >45 minutes was spent preparing to see the patient, reviewing tests, x-rays, operative reports and outside records.  Also, obtaining history and performing comprehensive physical exam.  Additionally, counseling the patient regarding the above listed issues.   Finally, documenting clinical information in the health records,  coordination of care, educating the patient on CHF, COPD. It is a complex case.   Lab Results  Component Value Date   WBC 12.2 (H) 09/18/2019   HGB 11.5 (L) 09/18/2019   HCT 35.3 (L) 09/18/2019   PLT 224 09/18/2019   GLUCOSE 98 11/07/2019   CHOL 239 (H) 08/26/2019   TRIG 155 (H) 08/26/2019   HDL 54 08/26/2019   LDLDIRECT  144.3 01/31/2011   LDLCALC 154 (H) 08/26/2019   ALT 16 09/17/2019   AST 25 09/17/2019   NA 143 11/07/2019   K 4.2 11/07/2019   CL 96 11/07/2019   CREATININE 1.52 (H) 11/07/2019   BUN 22 11/07/2019   CO2 28 11/07/2019   TSH 3.830 09/16/2019   INR 1.0 09/15/2019   HGBA1C 6.3 (H) 08/26/2019    DG Chest 2 View  Result Date: 09/15/2019 CLINICAL DATA:  Shortness of breath EXAM: CHEST - 2 VIEW COMPARISON:  09/12/2019 FINDINGS: Trace pleural effusions. Bibasilar atelectasis. Chronic interstitial prominence. No pneumothorax. Top normal heart size. No acute osseous abnormality. Diffuse calcification of the visualized aorta. IMPRESSION: Persistent trace pleural effusions and bibasilar atelectasis. Electronically Signed   By: Macy Mis M.D.   On: 09/15/2019 14:14   CT Head Wo Contrast  Result Date: 09/15/2019 CLINICAL DATA:  Focal neurological deficit, stroke suspected EXAM: CT HEAD WITHOUT CONTRAST TECHNIQUE: Contiguous axial images were obtained from the base of the skull through the vertex without intravenous contrast. COMPARISON:  08/13/2016 head CT. FINDINGS: Brain: No acute infarct or intracranial hemorrhage. No mass lesion. No midline shift or extra-axial fluid collection. Mild parenchymal volume loss with ex vacuo dilatation is unchanged. Vascular: No hyperdense vessel. Bilateral carotid siphon and V4 segment atherosclerotic calcifications. Skull: Negative for fracture or focal lesion. Sinuses/Orbits: Normal orbits. Clear paranasal sinuses. No mastoid effusion. Other: None. IMPRESSION: No acute intracranial process. Mild cerebral atrophy, similar to prior exam and  within normal limits for patient's age. Electronically Signed   By: Primitivo Gauze M.D.   On: 09/15/2019 14:09   ECHOCARDIOGRAM COMPLETE  Result Date: 09/16/2019    ECHOCARDIOGRAM REPORT   Patient Name:   SHINIKA ESTELLE Date of Exam: 09/16/2019 Medical Rec #:  [id number]     Height:       64.0 in Accession #:    [id number]    Weight:       125.7 lb Date of Birth:  Aug 04, 1935     BSA:          1.606 m Patient Age:    6 years      BP:           128/57 mmHg Patient Gender: F             HR:           80 bpm. Exam Location:  Inpatient Procedure: 2D Echo, Cardiac Doppler and Color Doppler Indications:    I50.33 Acute on chronic diastolic (congestive) heart failure  History:        Patient has prior history of Echocardiogram examinations, most                 recent 08/27/2019. COPD, Arrythmias:Atrial Fibrillation; Risk                 Factors:Hypertension and GERD.  Sonographer:    Jonelle Sidle Dance Referring Phys: [id number] [place]  1. Left ventricular ejection fraction, by estimation, is 50 to 55%. The left ventricle has low normal function. The left ventricle has no regional wall motion abnormalities. There is mild concentric left ventricular hypertrophy. Left ventricular diastolic function could not be evaluated.  2. Right ventricular systolic function is normal. The right ventricular size is normal. Tricuspid regurgitation signal is inadequate for assessing PA pressure.  3. Left atrial size was mildly dilated.  4. The mitral valve is normal in structure. Trivial mitral valve regurgitation. No evidence of mitral stenosis.  5. The aortic valve is tricuspid. Aortic valve regurgitation is mild. No aortic stenosis is present.  6. The inferior vena cava is dilated in size with <50% respiratory variability, suggesting right atrial pressure of 15 mmHg. FINDINGS  Left Ventricle: Left ventricular ejection fraction, by estimation, is 50 to 55%. The left ventricle has low normal function. The left  ventricle has no regional wall motion abnormalities. The left ventricular internal cavity size was normal in size. There is mild concentric left ventricular hypertrophy. Left ventricular diastolic function could not be evaluated due to atrial fibrillation. Left ventricular diastolic function could not be evaluated. Right Ventricle: The right ventricular size is normal. No increase in right ventricular wall thickness. Right ventricular systolic function is normal. Tricuspid regurgitation signal is inadequate for assessing PA pressure. Left Atrium: Left atrial size was mildly dilated. Right Atrium: Right atrial size was normal in size. Pericardium: A small pericardial effusion is present. The pericardial effusion is circumferential. Mitral Valve: The mitral valve is normal in structure. Mild to moderate mitral annular calcification. Trivial mitral valve regurgitation. No evidence of mitral valve stenosis. Tricuspid Valve: The tricuspid valve is normal in structure. Tricuspid valve regurgitation is trivial. No evidence of tricuspid stenosis. Aortic Valve: The aortic valve is tricuspid. . There is mild thickening and mild calcification of the aortic valve. Aortic valve regurgitation is mild. Aortic regurgitation PHT measures 237 msec. No aortic stenosis is present. There is mild thickening of  the aortic valve. There is mild calcification of the aortic valve. Pulmonic Valve: The pulmonic valve was grossly normal. Pulmonic valve regurgitation is mild. No evidence of pulmonic stenosis. Aorta: The aortic root and ascending aorta are structurally normal, with no evidence of dilitation. Venous: The inferior vena cava is dilated in size with less than 50% respiratory variability, suggesting right atrial pressure of 15 mmHg. IAS/Shunts: The atrial septum is grossly normal. Additional Comments: There is a small pleural effusion in both left and right lateral regions.  LEFT VENTRICLE PLAX 2D LVIDd:         3.54 cm LVIDs:          2.70 cm LV PW:         1.30 cm LV IVS:        1.50 cm LVOT diam:     1.80 cm LV SV:         32 LV SV Index:   20 LVOT Area:     2.54 cm  RIGHT VENTRICLE             IVC RV Basal diam:  2.70 cm     IVC diam: 2.30 cm RV S prime:     10.10 cm/s TAPSE (M-mode): 1.8 cm LEFT ATRIUM             Index       RIGHT ATRIUM           Index LA diam:        4.40 cm 2.74 cm/m  RA Area:     10.50 cm LA Vol (A2C):   39.1 ml 24.35 ml/m RA Volume:   21.10 ml  13.14 ml/m LA Vol (A4C):   32.3 ml 20.12 ml/m LA Biplane Vol: 36.4 ml 22.67 ml/m  AORTIC VALVE LVOT Vmax:   66.55 cm/s LVOT Vmean:  43.900 cm/s LVOT VTI:    0.126 m AI PHT:      237 msec  AORTA Ao Root diam: 3.00 cm Ao Asc diam:  3.50 cm MITRAL VALVE MV  Area (PHT): 2.48 cm    SHUNTS MV Decel Time: 306 msec    Systemic VTI:  0.13 m MV E velocity: 36.80 cm/s  Systemic Diam: 1.80 cm MV A velocity: 34.90 cm/s MV E/A ratio:  1.05 Buford Dresser MD Electronically signed by Buford Dresser MD Signature Date/Time: 09/16/2019/10:36:43 PM    Final    VAS [place] UPPER EXTREMITY VENOUS DUPLEX  Result Date: 09/16/2019 UPPER VENOUS STUDY  Indications: Pain, and Swelling Limitations: Pain. Comparison Study: No prior study Performing Technologist: Maudry Mayhew MHA, RDMS, RVT, RDCS  Examination Guidelines: A complete evaluation includes B-mode imaging, spectral Doppler, color Doppler, and power Doppler as needed of all accessible portions of each vessel. Bilateral testing is considered an integral part of a complete examination. Limited examinations for reoccurring indications may be performed as noted.  Right Findings: +----------+------------+---------+-----------+----------+--------------+ RIGHT     CompressiblePhasicitySpontaneousProperties   Summary     +----------+------------+---------+-----------+----------+--------------+ IJV           Full       Yes       Yes                              +----------+------------+---------+-----------+----------+--------------+ Subclavian    Full       Yes       Yes                             +----------+------------+---------+-----------+----------+--------------+ Axillary      Full       Yes       Yes                             +----------+------------+---------+-----------+----------+--------------+ Brachial      Full       Yes       Yes                             +----------+------------+---------+-----------+----------+--------------+ Radial        Full                                                 +----------+------------+---------+-----------+----------+--------------+ Ulnar         Full                                                 +----------+------------+---------+-----------+----------+--------------+ Cephalic      Full                                                 +----------+------------+---------+-----------+----------+--------------+ Basilic                                             Not visualized +----------+------------+---------+-----------+----------+--------------+  Left Findings: +----------+------------+---------+-----------+----------+--------------+ LEFT      CompressiblePhasicitySpontaneousProperties   Summary     +----------+------------+---------+-----------+----------+--------------+  Subclavian                                          Not visualized +----------+------------+---------+-----------+----------+--------------+  Summary:  Right: No evidence of deep vein thrombosis in the upper extremity. No evidence of superficial vein thrombosis in the upper extremity.  *See table(s) above for measurements and observations.  Diagnosing physician: Harold Barban MD Electronically signed by Harold Barban MD on 09/16/2019 at 4:19:31 PM.    Final     Assessment & Plan:     Follow-up: No follow-ups on file.  Walker Kehr, MD

## 2020-01-14 NOTE — Assessment & Plan Note (Signed)
Worse Medrol pack

## 2020-01-19 ENCOUNTER — Telehealth: Payer: Self-pay | Admitting: Internal Medicine

## 2020-01-19 NOTE — Telephone Encounter (Signed)
torsemide (DEMADEX) 100 MG tablet Patient states this medication is too strong and its running her down and wants to know if she needs to still keep taking it because she states all the fluid is gone. Patient # [phone]

## 2020-01-19 NOTE — Telephone Encounter (Signed)
Take Torsemide 100 mg 1-2 or 3 times a week as needed for swelling. Keep ROV Thx

## 2020-01-19 NOTE — Telephone Encounter (Signed)
Notified pt w/MD response.../lmb 

## 2020-01-21 ENCOUNTER — Telehealth (INDEPENDENT_AMBULATORY_CARE_PROVIDER_SITE_OTHER): Payer: Medicare Other | Admitting: Internal Medicine

## 2020-01-21 ENCOUNTER — Ambulatory Visit: Payer: Medicare Other | Admitting: Internal Medicine

## 2020-01-21 ENCOUNTER — Encounter: Payer: Self-pay | Admitting: Internal Medicine

## 2020-01-21 ENCOUNTER — Telehealth: Payer: Medicare Other | Admitting: Family

## 2020-01-21 ENCOUNTER — Other Ambulatory Visit: Payer: Self-pay

## 2020-01-21 DIAGNOSIS — J449 Chronic obstructive pulmonary disease, unspecified: Secondary | ICD-10-CM

## 2020-01-21 DIAGNOSIS — I5031 Acute diastolic (congestive) heart failure: Secondary | ICD-10-CM

## 2020-01-21 DIAGNOSIS — K121 Other forms of stomatitis: Secondary | ICD-10-CM | POA: Diagnosis not present

## 2020-01-21 DIAGNOSIS — R5381 Other malaise: Secondary | ICD-10-CM | POA: Diagnosis not present

## 2020-01-21 MED ORDER — MAGIC MOUTHWASH: 5.0000 mL | Freq: Four times a day (QID) | ORAL | 1 refills | Status: DC

## 2020-01-21 NOTE — Assessment & Plan Note (Signed)
Due to higher dose of torsemide.  She was instructed to take torsemide 50-100 mg 1-2 times a week to avoid dehydration/volume loss. Discontinue Gatorade due to high salt content.  Drink sips of water frequently

## 2020-01-21 NOTE — Progress Notes (Signed)
Virtual Visit via Telephone Note  I connected with [name] on 01/21/20 at 11:20 AM EST by telephone and verified that I am speaking with the correct person using two identifiers.  Location: Patient: [name] Provider: AVP   I discussed the limitations, risks, security and privacy concerns of performing an evaluation and management service by telephone and the availability of in person appointments. I also discussed with the patient that there may be a patient responsible charge related to this service. The patient expressed understanding and agreed to proceed.   History of Present Illness:   C/o dry mouth, weakness, mouth irritation.  She has been feeling drained.  She stopped torsemide 2 days ago.  She has been drinking water and Gatorade.  She is not short of breath.  There is no leg swelling.  Observations/Objective:  She sounds normal on the phone Assessment and Plan:  See plan Follow Up Instructions:    I discussed the assessment and treatment plan with the patient. The patient was provided an opportunity to ask questions and all were answered. The patient agreed with the plan and demonstrated an understanding of the instructions.   The patient was advised to call back or seek an in-person evaluation if the symptoms worsen or if the condition fails to improve as anticipated.  I provided 21 minutes of non-face-to-face time during this encounter.   Walker Kehr, MD

## 2020-01-21 NOTE — Assessment & Plan Note (Signed)
Breathing is better on higher dose of torsemide.  She was instructed to take torsemide 50-100 mg 1-2 times a week to avoid dehydration/volume loss. Discontinue Gatorade due to high salt content.  Drink sips of water frequently Continue with Trelegy

## 2020-01-21 NOTE — Assessment & Plan Note (Signed)
Better on the higher dose of torsemide.  She was instructed to take torsemide 50-100 mg 1-2 times a week to avoid dehydration/volume loss. Discontinue Gatorade

## 2020-01-21 NOTE — Assessment & Plan Note (Signed)
Likely due to dry mouth.  Rehydrate.  Magic mouthwash as needed

## 2020-01-25 NOTE — Progress Notes (Signed)
Subjective:    Patient ID: [name], female    DOB: [birth year], 84 y.o.   MRN: [id number]  HPI The patient is here for an acute visit.  She is here with multiple complaints.  Losing weight.  Not eating well - has thrush in mouth. It hurts to eat. She has no bottom teeth, but states she can still eat most things.  She has tried two different mouthwashes for her mouth and they have not worked.    Her legs are weak.  She has COPD and can not walk long distances due to SOB.  She wants to walk.  She has a chronic cough and brings up sputum that is discolored, but states it is not worse than usual.      Medications and allergies reviewed with patient and updated if appropriate.  Patient Active Problem List   Diagnosis Date Noted  . Stomatitis 01/21/2020  . Edema 12/03/2019  . AKI (acute kidney injury) ([place])   . Acute diastolic CHF (congestive heart failure) ([place])   . Hypokalemia 09/15/2019  . CKD (chronic kidney disease), stage III ([place]) 09/15/2019  . Elevated troponin 09/15/2019  . Hypertensive emergency 09/15/2019  . Hyperlipidemia with target LDL less than 70 08/27/2019  . Acute ST elevation myocardial infarction (STEMI) of lateral wall ([place]) 08/26/2019  . Heart attack ([place]) 08/26/2019  . Ankle pain, left 07/23/2019  . Intertrigo 02/05/2019  . Thrush 01/10/2019  . Shingles 09/16/2018  . CAD S/P percutaneous coronary angioplasty 05/06/2018  . Esophageal dysmotility 01/11/2018  . Weight gain 01/01/2018  . Abdominal bloating 01/01/2018  . Hyperkalemia 06/04/2017  . Grief at loss of child 04/02/2017  . Adenopathy, cervical 11/29/2016  . Dysuria 10/31/2016  . Vertigo 10/31/2016  . Ureteral calculus 09/11/2016  . CAP (community acquired pneumonia) 08/30/2016  . Fatigue 08/24/2016  . Sensation of pressure in bladder area 08/24/2016  . Kidney stone 08/13/2016  . Flank pain 08/10/2016  . Right upper quadrant abdominal tenderness without rebound tenderness 08/10/2016  .  Leukocytosis 08/10/2016  . Acute abdomen 08/10/2016  . Intractable nausea and vomiting 08/10/2016  . ARF (acute renal failure) ([place]) 08/10/2016  . Urolithiasis 08/10/2016  . Abdominal pain 08/10/2016  . Anemia 05/19/2016  . Generalized anxiety disorder 01/14/2016  . Dysphagia 04/16/2015  . Dry skin dermatitis 12/28/2014  . Atrophic vaginitis 05/26/2014  . Varicose veins of both lower extremities 12/12/2013  . Restless leg syndrome 03/17/2013  . Impacted cerumen of left ear 03/17/2013  . Osteoarthritis, hand 09/05/2011  . Epistaxis, recurrent 08/17/2011  . Well adult exam 01/31/2011  . Low back pain 01/31/2011  . Palpitations 05/24/2010  . Cerumen impaction 02/23/2010  . PHARYNGITIS, ACUTE 02/23/2010  . SINUSITIS, ACUTE 01/07/2010  . SOB (shortness of breath) 09/23/2009  . BRONCHITIS, ACUTE 08/03/2009  . Osteoarthritis 07/06/2009  . TOBACCO USE, QUIT 03/08/2009  . ALLERGIC RHINITIS 11/06/2008  . NAUSEA ALONE 08/19/2008  . DIARRHEA 08/19/2008  . Essential hypertension 07/15/2008  . EMPHYSEMA 07/15/2008  . Peptic ulcer 07/15/2008  . ARTHRITIS 07/15/2008  . HEADACHE 05/25/2008  . SYNCOPE 03/23/2008  . WEIGHT LOSS, ABNORMAL 03/23/2008  . HEARING IMPAIRMENT 12/23/2007  . B12 deficiency 10/14/2007  . Malaise 10/14/2007  . INSOMNIA, PERSISTENT 07/31/2007  . Anxiety state 03/01/2007  . DEPRESSION 03/01/2007  . Paroxysmal atrial fibrillation ([place]) 02/26/2007  . Vitamin D deficiency 02/19/2007  . GERD 02/19/2007  . COPD with chronic bronchitis ([place]) 08/07/2006    Current Outpatient Medications on File Prior to  Visit  Medication Sig Dispense Refill  . aspirin 81 MG chewable tablet Chew 1 tablet (81 mg total) by mouth daily.    . Biotin 1000 MCG tablet Take 1,000 mcg by mouth daily.    . Cholecalciferol ([employer] VITAMIN D3) 1000 UNITS capsule Take 1,000 Units by mouth daily.      . clonazePAM (KLONOPIN) 0.5 MG tablet TAKE 1 TABLET BY MOUTH AT BEDTIME AS NEEDED FOR LEG CRAMPS 90  tablet 1  . clopidogrel (PLAVIX) 75 MG tablet Take 1 tablet (75 mg total) by mouth daily with breakfast. 90 tablet 3  . cyanocobalamin 1000 MCG tablet Take 1,000 mcg by mouth daily.    Marland Kitchen docusate sodium (COLACE) 100 MG capsule Take 1 capsule (100 mg total) by mouth 2 (two) times daily. 10 capsule 0  . Fluticasone-Umeclidin-Vilant (TRELEGY ELLIPTA) 100-62.5-25 MCG/INH AEPB Inhale 1 Inhaler into the lungs daily. 3 each 3  . isosorbide mononitrate (IMDUR) 30 MG 24 hr tablet Take 1 tablet (30 mg total) by mouth daily. 30 tablet 0  . ketoconazole (NIZORAL) 2 % cream APPLY TOPICALLY TO THE AFFECTED AREA DAILY (Patient taking differently: Apply 1 application topically daily as needed for irritation. ) 45 g 1  . methylPREDNISolone (MEDROL DOSEPAK) 4 MG TBPK tablet As directed 21 tablet 0  . metoprolol tartrate (LOPRESSOR) 25 MG tablet Take 1 tablet (25 mg total) by mouth 2 (two) times daily. 180 tablet 3  . nitroGLYCERIN (NITROSTAT) 0.4 MG SL tablet Place 1 tablet (0.4 mg total) under the tongue every 5 (five) minutes as needed for chest pain. 25 tablet 0  . pantoprazole (PROTONIX) 40 MG tablet Take 1 tablet (40 mg total) by mouth daily. 90 tablet 3  . rosuvastatin (CRESTOR) 10 MG tablet Take 1 tablet (10 mg total) by mouth daily. 90 tablet 3  . tobramycin (TOBREX) 0.3 % ophthalmic solution Place 1 drop into both eyes every 6 (six) hours.     . magic mouthwash SOLN Take 5 mLs by mouth 4 (four) times daily. (Patient not taking: Reported on 01/26/2020) 200 mL 1  . magic mouthwash w/lidocaine SOLN Take 5 mLs by mouth 4 (four) times daily as needed for mouth pain. (Patient not taking: Reported on 01/26/2020) 240 mL 0  . torsemide (DEMADEX) 100 MG tablet Take 1 tablet (100 mg total) by mouth daily. (Patient not taking: Reported on 01/26/2020) 90 tablet 1   No current facility-administered medications on file prior to visit.    Past Medical History:  Diagnosis Date  . Anxiety   . Arthritis   . Atrial  fibrillation ([place])   . B12 deficiency   . COPD (chronic obstructive pulmonary disease) ([place])   . Depression   . GERD (gastroesophageal reflux disease)   . Hypertension   . Osteoarthrosis, hand    both hands  . Peptic ulcer, unspecified site, unspecified as acute or chronic, without mention of hemorrhage, perforation, or obstruction   . Pneumonia   . PONV (postoperative nausea and vomiting)   . Vitamin D deficiency disease     Past Surgical History:  Procedure Laterality Date  . BLADDER SURGERY     Bladder tack and intestines  . CORONARY/GRAFT ACUTE MI REVASCULARIZATION N/A 08/26/2019   Procedure: Coronary/Graft Acute MI Revascularization;  Surgeon: Sherren Mocha, MD;  Location: [facility];  Service: Cardiovascular;  Laterality: N/A;  . CYSTOCELE REPAIR    . CYSTOSCOPY WITH RETROGRADE PYELOGRAM, URETEROSCOPY AND STENT PLACEMENT Right 09/11/2016   Procedure: CYSTOSCOPY WITH RETROGRADE PYELOGRAM,  URETEROSCOPY AND STENT REPLACEMENT;  Surgeon: Cleon Gustin, MD;  Location: [facility];  Service: Urology;  Laterality: Right;  . CYSTOSCOPY WITH STENT PLACEMENT Right 08/11/2016   Procedure: CYSTOSCOPY, URETERSCOPY,  RIGHT RETROGRADE WITH RIGHT STENT PLACEMENT;  Surgeon: Festus Aloe, MD;  Location: [facility];  Service: Urology;  Laterality: Right;  . SPLENECTOMY      Social History   Socioeconomic History  . Marital status: Divorced    Spouse name: Not on file  . Number of children: 2  . Years of education: Not on file  . Highest education level: Not on file  Occupational History  . Occupation: Airline pilot: [employer]  Tobacco Use  . Smoking status: Former Smoker    Packs/day: 1.00    Types: Cigarettes    Quit date: 06/08/08    Years since quitting: 11.8  . Smokeless tobacco: Never Used  . Tobacco comment: pt unsure how many years she smoked  Vaping Use  . Vaping Use: Never used  Substance and Sexual Activity  . Alcohol use: No  . Drug use: No  . Sexual activity:  Not Currently  Other Topics Concern  . Not on file  Social History Narrative   Retired, works part time   Divorced   Former Smoker   1- Son alcoholic abusive   Daughter died in June 09, 2006   Social Determinants of Health   Financial Resource Strain: High Risk  . Difficulty of Paying Living Expenses: Very hard  Food Insecurity: Food Insecurity Present  . Worried About Charity fundraiser in the Last Year: Often true  . Ran Out of Food in the Last Year: Often true  Transportation Needs: Unmet Transportation Needs  . Lack of Transportation (Medical): Yes  . Lack of Transportation (Non-Medical): Yes  Physical Activity: Unknown  . Days of Exercise per Week: Patient refused  . Minutes of Exercise per Session: Not on file  Stress: No Stress Concern Present  . Feeling of Stress : Not at all  Social Connections:   . Frequency of Communication with Friends and Family: Not on file  . Frequency of Social Gatherings with Friends and Family: Not on file  . Attends Religious Services: Not on file  . Active Member of Clubs or Organizations: Not on file  . Attends Archivist Meetings: Not on file  . Marital Status: Not on file    Family History  Problem Relation Age of Onset  . Heart disease Father   . Arthritis Other        Family history of  . Coronary artery disease Other        Family history of  . Hypertension Other        Family history of  . Diabetes Daughter        51 died    Review of Systems  Constitutional: Positive for appetite change and fatigue. Negative for fever.  HENT: Negative for sore throat and trouble swallowing.        Feels like there is mucus in her throat - can't it up or down  Respiratory: Positive for cough (yellow - brown mucus - chronic) and shortness of breath. Negative for wheezing.   Cardiovascular: Positive for chest pain (with coughing). Negative for leg swelling.  Gastrointestinal: Positive for constipation and nausea (every morning). Negative  for abdominal pain and diarrhea.       No gerd  Neurological: Positive for dizziness and light-headedness. Negative for headaches.  Objective:   Vitals:   01/26/20 1403  BP: 140/90  Pulse: (!) 101  Temp: 98.2 F (36.8 C)  SpO2: 96%   BP Readings from Last 3 Encounters:  01/26/20 140/90  01/14/20 (!) 148/70  12/03/19 (!) 148/80   Wt Readings from Last 3 Encounters:  01/26/20 114 lb 9.6 oz (52 kg)  01/14/20 120 lb 6.4 oz (54.6 kg)  12/03/19 117 lb (53.1 kg)   Body mass index is 19.67 kg/m.   Physical Exam Constitutional:      General: She is not in acute distress.    Appearance: Normal appearance. She is not ill-appearing.  HENT:     Head: Normocephalic and atraumatic.  Cardiovascular:     Rate and Rhythm: Normal rate. Rhythm irregular.  Pulmonary:     Effort: Pulmonary effort is normal. No respiratory distress.     Breath sounds: Rales (bibasilar 1/2 way up - dry) present. No wheezing.  Musculoskeletal:     Cervical back: Neck supple. No tenderness.     Right lower leg: No edema.     Left lower leg: No edema.  Lymphadenopathy:     Cervical: No cervical adenopathy.  Skin:    General: Skin is warm and dry.  Neurological:     Mental Status: She is alert.          Assessment & Plan:   COPD with chronic bronchitis Chronic  She states symptoms are not worse than usual She does have SOB with exertion Deferred CXR ? pulm fibrosis Deferred CXR Does not use inhaler - does not help Deferred pulm referral  Thrush: Start nystatin S and S This is causing her not to eat and lose weight Advised f/u is symptoms do not improve for further evaluation  physical deconditioning Home PT ordered  GERD;  Chronic ? controlled Has increased mucus in throat Globus sensation in throat Increase protonix 40 mg to BID Deferred GI referral  Overall not feeling well, multiple compliants She deferred blood work Advised f/u with PCP   See Problem List for  Assessment and Plan of chronic medical problems.    This visit occurred during the SARS-CoV-2 public health emergency.  Safety protocols were in place, including screening questions prior to the visit, additional usage of staff PPE, and extensive cleaning of exam room while observing appropriate contact time as indicated for disinfecting solutions.

## 2020-01-26 ENCOUNTER — Ambulatory Visit (INDEPENDENT_AMBULATORY_CARE_PROVIDER_SITE_OTHER): Payer: Medicare Other | Admitting: Internal Medicine

## 2020-01-26 ENCOUNTER — Encounter: Payer: Self-pay | Admitting: Internal Medicine

## 2020-01-26 ENCOUNTER — Other Ambulatory Visit: Payer: Self-pay

## 2020-01-26 VITALS — BP 140/90 | HR 101 | Temp 98.2°F | Ht 64.0 in | Wt 114.6 lb

## 2020-01-26 DIAGNOSIS — K219 Gastro-esophageal reflux disease without esophagitis: Secondary | ICD-10-CM

## 2020-01-26 DIAGNOSIS — B370 Candidal stomatitis: Secondary | ICD-10-CM

## 2020-01-26 DIAGNOSIS — J449 Chronic obstructive pulmonary disease, unspecified: Secondary | ICD-10-CM | POA: Diagnosis not present

## 2020-01-26 DIAGNOSIS — R5381 Other malaise: Secondary | ICD-10-CM | POA: Diagnosis not present

## 2020-01-26 MED ORDER — NYSTATIN 100000 UNIT/ML MT SUSP: 5.0000 mL | Freq: Four times a day (QID) | OROMUCOSAL | 0 refills | Status: DC

## 2020-01-26 MED ORDER — PANTOPRAZOLE SODIUM 40 MG PO TBEC: 40.0000 mg | DELAYED_RELEASE_TABLET | Freq: Two times a day (BID) | ORAL | 3 refills | Status: DC

## 2020-01-26 NOTE — Patient Instructions (Addendum)
   Medications changes include :   Increase the pantoprazole to twice daily - take the medication 30 minutes prior to a meal.  Use the nystatin mouthwash for 10 days.    Your prescription(s) have been submitted to your pharmacy. Please take as directed and contact our office if you believe you are having problem(s) with the medication(s).   A referral was ordered for home physical therapy.       Someone from their office will call you to schedule an appointment.     Follow up with Dr Alain Marion.

## 2020-01-28 ENCOUNTER — Telehealth: Payer: Self-pay | Admitting: Internal Medicine

## 2020-01-28 NOTE — Telephone Encounter (Signed)
Noted. Thx.

## 2020-01-28 NOTE — Telephone Encounter (Signed)
[name] with [facility] called and said that the patient has requested 02/02/2020 be the start of care date due to the holiday. She just wanted to notify Dr. Alain Marion.

## 2020-01-30 DIAGNOSIS — J438 Other emphysema: Secondary | ICD-10-CM | POA: Diagnosis not present

## 2020-01-30 DIAGNOSIS — J449 Chronic obstructive pulmonary disease, unspecified: Secondary | ICD-10-CM | POA: Diagnosis not present

## 2020-02-02 ENCOUNTER — Telehealth (INDEPENDENT_AMBULATORY_CARE_PROVIDER_SITE_OTHER): Payer: Medicare Other | Admitting: Internal Medicine

## 2020-02-02 ENCOUNTER — Encounter: Payer: Self-pay | Admitting: Internal Medicine

## 2020-02-02 DIAGNOSIS — E538 Deficiency of other specified B group vitamins: Secondary | ICD-10-CM | POA: Diagnosis not present

## 2020-02-02 DIAGNOSIS — Z7982 Long term (current) use of aspirin: Secondary | ICD-10-CM | POA: Diagnosis not present

## 2020-02-02 DIAGNOSIS — N183 Chronic kidney disease, stage 3 unspecified: Secondary | ICD-10-CM | POA: Diagnosis not present

## 2020-02-02 DIAGNOSIS — I252 Old myocardial infarction: Secondary | ICD-10-CM | POA: Diagnosis not present

## 2020-02-02 DIAGNOSIS — Z9181 History of falling: Secondary | ICD-10-CM | POA: Diagnosis not present

## 2020-02-02 DIAGNOSIS — R634 Abnormal weight loss: Secondary | ICD-10-CM | POA: Diagnosis not present

## 2020-02-02 DIAGNOSIS — F32A Depression, unspecified: Secondary | ICD-10-CM | POA: Diagnosis not present

## 2020-02-02 DIAGNOSIS — R29898 Other symptoms and signs involving the musculoskeletal system: Secondary | ICD-10-CM | POA: Diagnosis not present

## 2020-02-02 DIAGNOSIS — N179 Acute kidney failure, unspecified: Secondary | ICD-10-CM | POA: Diagnosis not present

## 2020-02-02 DIAGNOSIS — M19042 Primary osteoarthritis, left hand: Secondary | ICD-10-CM | POA: Diagnosis not present

## 2020-02-02 DIAGNOSIS — I5031 Acute diastolic (congestive) heart failure: Secondary | ICD-10-CM | POA: Diagnosis not present

## 2020-02-02 DIAGNOSIS — E559 Vitamin D deficiency, unspecified: Secondary | ICD-10-CM | POA: Diagnosis not present

## 2020-02-02 DIAGNOSIS — R42 Dizziness and giddiness: Secondary | ICD-10-CM | POA: Diagnosis not present

## 2020-02-02 DIAGNOSIS — I130 Hypertensive heart and chronic kidney disease with heart failure and stage 1 through stage 4 chronic kidney disease, or unspecified chronic kidney disease: Secondary | ICD-10-CM | POA: Diagnosis not present

## 2020-02-02 DIAGNOSIS — J449 Chronic obstructive pulmonary disease, unspecified: Secondary | ICD-10-CM | POA: Diagnosis not present

## 2020-02-02 DIAGNOSIS — R0602 Shortness of breath: Secondary | ICD-10-CM | POA: Diagnosis not present

## 2020-02-02 DIAGNOSIS — I480 Paroxysmal atrial fibrillation: Secondary | ICD-10-CM | POA: Diagnosis not present

## 2020-02-02 DIAGNOSIS — Z7902 Long term (current) use of antithrombotics/antiplatelets: Secondary | ICD-10-CM | POA: Diagnosis not present

## 2020-02-02 DIAGNOSIS — Z7952 Long term (current) use of systemic steroids: Secondary | ICD-10-CM | POA: Diagnosis not present

## 2020-02-02 DIAGNOSIS — I2510 Atherosclerotic heart disease of native coronary artery without angina pectoris: Secondary | ICD-10-CM | POA: Diagnosis not present

## 2020-02-02 DIAGNOSIS — B370 Candidal stomatitis: Secondary | ICD-10-CM | POA: Diagnosis not present

## 2020-02-02 DIAGNOSIS — M19041 Primary osteoarthritis, right hand: Secondary | ICD-10-CM | POA: Diagnosis not present

## 2020-02-02 DIAGNOSIS — Z87891 Personal history of nicotine dependence: Secondary | ICD-10-CM | POA: Diagnosis not present

## 2020-02-02 NOTE — Progress Notes (Signed)
Virtual Visit via Telephone Note  I connected with [name] on 02/02/20 at  3:20 PM EST by telephone and verified that I am speaking with the correct person using two identifiers.  Location: Patient: home Provider: office   I discussed the limitations, risks, security and privacy concerns of performing an evaluation and management service by telephone and the availability of in person appointments. I also discussed with the patient that there may be a patient responsible charge related to this service. The patient expressed understanding and agreed to proceed.   History of Present Illness:   SOB and DOE all the time  Observations/Objective:  Not dyspneic w/talking  Assessment and Plan:  See plan  Follow Up Instructions:    I discussed the assessment and treatment plan with the patient. The patient was provided an opportunity to ask questions and all were answered. The patient agreed with the plan and demonstrated an understanding of the instructions.   The patient was advised to call back or seek an in-person evaluation if the symptoms worsen or if the condition fails to improve as anticipated.  I provided 22 minutes of non-face-to-face time during this encounter.   Walker Kehr, MD

## 2020-02-02 NOTE — Assessment & Plan Note (Signed)
Worse CXR Pulm ref Trelegy

## 2020-02-03 ENCOUNTER — Other Ambulatory Visit: Payer: Self-pay

## 2020-02-03 ENCOUNTER — Ambulatory Visit (INDEPENDENT_AMBULATORY_CARE_PROVIDER_SITE_OTHER)
Admission: RE | Admit: 2020-02-03 | Discharge: 2020-02-03 | Disposition: A | Discharge status: Home / Self Care | Payer: Medicare Other | Source: Ambulatory Visit | Attending: Internal Medicine | Admitting: Internal Medicine

## 2020-02-03 ENCOUNTER — Telehealth: Payer: Self-pay | Admitting: Internal Medicine

## 2020-02-03 DIAGNOSIS — R0602 Shortness of breath: Secondary | ICD-10-CM

## 2020-02-03 DIAGNOSIS — J449 Chronic obstructive pulmonary disease, unspecified: Secondary | ICD-10-CM | POA: Diagnosis not present

## 2020-02-03 DIAGNOSIS — J90 Pleural effusion, not elsewhere classified: Secondary | ICD-10-CM | POA: Diagnosis not present

## 2020-02-03 DIAGNOSIS — J9811 Atelectasis: Secondary | ICD-10-CM | POA: Diagnosis not present

## 2020-02-03 NOTE — Telephone Encounter (Signed)
Liji from [facility] called   Verbal orders are needed for  Physical therapy 2 times for 6 weeks and then 1 time for 2 weeks.   As well as a skilled nurse to assist the patient for conditional care.  Please give her a call back at [phone]

## 2020-02-04 ENCOUNTER — Telehealth: Payer: Self-pay | Admitting: Cardiology

## 2020-02-04 NOTE — Telephone Encounter (Addendum)
Verbals left on [employer].

## 2020-02-04 NOTE — Telephone Encounter (Signed)
Left message for pt to call.

## 2020-02-04 NOTE — Telephone Encounter (Signed)
Okay.  Thanks.

## 2020-02-04 NOTE — Telephone Encounter (Signed)
New Message:    Please call, she says she have some questions about her breathing.

## 2020-02-05 ENCOUNTER — Telehealth: Payer: Self-pay | Admitting: Internal Medicine

## 2020-02-05 DIAGNOSIS — F32A Depression, unspecified: Secondary | ICD-10-CM | POA: Diagnosis not present

## 2020-02-05 DIAGNOSIS — B370 Candidal stomatitis: Secondary | ICD-10-CM | POA: Diagnosis not present

## 2020-02-05 DIAGNOSIS — N183 Chronic kidney disease, stage 3 unspecified: Secondary | ICD-10-CM | POA: Diagnosis not present

## 2020-02-05 DIAGNOSIS — E559 Vitamin D deficiency, unspecified: Secondary | ICD-10-CM | POA: Diagnosis not present

## 2020-02-05 DIAGNOSIS — Z87891 Personal history of nicotine dependence: Secondary | ICD-10-CM | POA: Diagnosis not present

## 2020-02-05 DIAGNOSIS — R29898 Other symptoms and signs involving the musculoskeletal system: Secondary | ICD-10-CM | POA: Diagnosis not present

## 2020-02-05 DIAGNOSIS — Z7982 Long term (current) use of aspirin: Secondary | ICD-10-CM | POA: Diagnosis not present

## 2020-02-05 DIAGNOSIS — I5031 Acute diastolic (congestive) heart failure: Secondary | ICD-10-CM | POA: Diagnosis not present

## 2020-02-05 DIAGNOSIS — I130 Hypertensive heart and chronic kidney disease with heart failure and stage 1 through stage 4 chronic kidney disease, or unspecified chronic kidney disease: Secondary | ICD-10-CM | POA: Diagnosis not present

## 2020-02-05 DIAGNOSIS — Z7902 Long term (current) use of antithrombotics/antiplatelets: Secondary | ICD-10-CM | POA: Diagnosis not present

## 2020-02-05 DIAGNOSIS — M19041 Primary osteoarthritis, right hand: Secondary | ICD-10-CM | POA: Diagnosis not present

## 2020-02-05 DIAGNOSIS — R634 Abnormal weight loss: Secondary | ICD-10-CM | POA: Diagnosis not present

## 2020-02-05 DIAGNOSIS — Z7952 Long term (current) use of systemic steroids: Secondary | ICD-10-CM | POA: Diagnosis not present

## 2020-02-05 DIAGNOSIS — I480 Paroxysmal atrial fibrillation: Secondary | ICD-10-CM | POA: Diagnosis not present

## 2020-02-05 DIAGNOSIS — E538 Deficiency of other specified B group vitamins: Secondary | ICD-10-CM | POA: Diagnosis not present

## 2020-02-05 DIAGNOSIS — R42 Dizziness and giddiness: Secondary | ICD-10-CM | POA: Diagnosis not present

## 2020-02-05 DIAGNOSIS — Z9181 History of falling: Secondary | ICD-10-CM | POA: Diagnosis not present

## 2020-02-05 DIAGNOSIS — N179 Acute kidney failure, unspecified: Secondary | ICD-10-CM | POA: Diagnosis not present

## 2020-02-05 DIAGNOSIS — I252 Old myocardial infarction: Secondary | ICD-10-CM | POA: Diagnosis not present

## 2020-02-05 DIAGNOSIS — M19042 Primary osteoarthritis, left hand: Secondary | ICD-10-CM | POA: Diagnosis not present

## 2020-02-05 DIAGNOSIS — J449 Chronic obstructive pulmonary disease, unspecified: Secondary | ICD-10-CM | POA: Diagnosis not present

## 2020-02-05 DIAGNOSIS — I2510 Atherosclerotic heart disease of native coronary artery without angina pectoris: Secondary | ICD-10-CM | POA: Diagnosis not present

## 2020-02-05 NOTE — Telephone Encounter (Signed)
    Liji from [facility] calling to request advice for oxygen. She states the patient was at 82% not wearing oxygen Order needed for oxygen, is oxygen needed only prn? Please outline parameters  Please call [phone]

## 2020-02-06 NOTE — Telephone Encounter (Signed)
Notified Ligi w/MD response. She is wanting to know how much oxygen pt should be on along w/parameters. Not listed on med list../lmb

## 2020-02-06 NOTE — Telephone Encounter (Signed)
Left message for pt to call.

## 2020-02-06 NOTE — Telephone Encounter (Signed)
Returned call to pt she states that she got some O2 and thinks that she is fine now.

## 2020-02-06 NOTE — Telephone Encounter (Signed)
Patient calling stating we need to call [place] regarding her oxygen, unable to explain why. [phone]

## 2020-02-06 NOTE — Telephone Encounter (Signed)
Okay oxygen.  She was supposed to see pulmonologist.  Thanks

## 2020-02-06 NOTE — Telephone Encounter (Signed)
Patient returning call.

## 2020-02-07 ENCOUNTER — Other Ambulatory Visit: Payer: Self-pay

## 2020-02-07 ENCOUNTER — Encounter (HOSPITAL_COMMUNITY): Payer: Self-pay | Admitting: Emergency Medicine

## 2020-02-07 ENCOUNTER — Emergency Department (HOSPITAL_COMMUNITY)
Admission: EM | Admit: 2020-02-07 | Discharge: 2020-02-07 | Disposition: A | Discharge status: Home / Self Care | Payer: Medicare Other | Attending: Emergency Medicine | Admitting: Emergency Medicine

## 2020-02-07 DIAGNOSIS — I499 Cardiac arrhythmia, unspecified: Secondary | ICD-10-CM | POA: Diagnosis not present

## 2020-02-07 DIAGNOSIS — R6889 Other general symptoms and signs: Secondary | ICD-10-CM | POA: Diagnosis not present

## 2020-02-07 DIAGNOSIS — Z5321 Procedure and treatment not carried out due to patient leaving prior to being seen by health care provider: Secondary | ICD-10-CM | POA: Diagnosis not present

## 2020-02-07 DIAGNOSIS — Z743 Need for continuous supervision: Secondary | ICD-10-CM | POA: Diagnosis not present

## 2020-02-07 DIAGNOSIS — R0602 Shortness of breath: Secondary | ICD-10-CM | POA: Diagnosis not present

## 2020-02-07 DIAGNOSIS — J3489 Other specified disorders of nose and nasal sinuses: Secondary | ICD-10-CM | POA: Diagnosis not present

## 2020-02-07 NOTE — ED Notes (Signed)
Pt called sister to pick her up because she doesn't want to wait any longer Pt seen leaving

## 2020-02-07 NOTE — ED Triage Notes (Signed)
Pt to triage via [facility] from home.  States they were called out for SOB but actually pt is wanting a humidifier for her oxygen.  Pt wears 3 1/2 liters of oxygen at home and the nurse case manager told her to put [place] in her nose for comfort and pt states that is not what Alvera Novel is for and refused to try it.  States her nose is dry.  Pt upset over wait and requested EMS to take her home.  Explained that they are not able to take her back home.  No distress.

## 2020-02-08 NOTE — Telephone Encounter (Signed)
2 L/min via nasal cannula.  Thanks

## 2020-02-09 ENCOUNTER — Telehealth: Payer: Self-pay | Admitting: Cardiology

## 2020-02-09 DIAGNOSIS — R29898 Other symptoms and signs involving the musculoskeletal system: Secondary | ICD-10-CM | POA: Diagnosis not present

## 2020-02-09 DIAGNOSIS — Z87891 Personal history of nicotine dependence: Secondary | ICD-10-CM | POA: Diagnosis not present

## 2020-02-09 DIAGNOSIS — N183 Chronic kidney disease, stage 3 unspecified: Secondary | ICD-10-CM | POA: Diagnosis not present

## 2020-02-09 DIAGNOSIS — R42 Dizziness and giddiness: Secondary | ICD-10-CM | POA: Diagnosis not present

## 2020-02-09 DIAGNOSIS — I480 Paroxysmal atrial fibrillation: Secondary | ICD-10-CM | POA: Diagnosis not present

## 2020-02-09 DIAGNOSIS — I5031 Acute diastolic (congestive) heart failure: Secondary | ICD-10-CM | POA: Diagnosis not present

## 2020-02-09 DIAGNOSIS — Z7902 Long term (current) use of antithrombotics/antiplatelets: Secondary | ICD-10-CM | POA: Diagnosis not present

## 2020-02-09 DIAGNOSIS — R634 Abnormal weight loss: Secondary | ICD-10-CM | POA: Diagnosis not present

## 2020-02-09 DIAGNOSIS — Z7952 Long term (current) use of systemic steroids: Secondary | ICD-10-CM | POA: Diagnosis not present

## 2020-02-09 DIAGNOSIS — Z7982 Long term (current) use of aspirin: Secondary | ICD-10-CM | POA: Diagnosis not present

## 2020-02-09 DIAGNOSIS — B370 Candidal stomatitis: Secondary | ICD-10-CM | POA: Diagnosis not present

## 2020-02-09 DIAGNOSIS — M19041 Primary osteoarthritis, right hand: Secondary | ICD-10-CM | POA: Diagnosis not present

## 2020-02-09 DIAGNOSIS — I2510 Atherosclerotic heart disease of native coronary artery without angina pectoris: Secondary | ICD-10-CM | POA: Diagnosis not present

## 2020-02-09 DIAGNOSIS — N179 Acute kidney failure, unspecified: Secondary | ICD-10-CM | POA: Diagnosis not present

## 2020-02-09 DIAGNOSIS — J449 Chronic obstructive pulmonary disease, unspecified: Secondary | ICD-10-CM | POA: Diagnosis not present

## 2020-02-09 DIAGNOSIS — I130 Hypertensive heart and chronic kidney disease with heart failure and stage 1 through stage 4 chronic kidney disease, or unspecified chronic kidney disease: Secondary | ICD-10-CM | POA: Diagnosis not present

## 2020-02-09 DIAGNOSIS — I252 Old myocardial infarction: Secondary | ICD-10-CM | POA: Diagnosis not present

## 2020-02-09 DIAGNOSIS — F32A Depression, unspecified: Secondary | ICD-10-CM | POA: Diagnosis not present

## 2020-02-09 DIAGNOSIS — E559 Vitamin D deficiency, unspecified: Secondary | ICD-10-CM | POA: Diagnosis not present

## 2020-02-09 DIAGNOSIS — Z9181 History of falling: Secondary | ICD-10-CM | POA: Diagnosis not present

## 2020-02-09 DIAGNOSIS — E538 Deficiency of other specified B group vitamins: Secondary | ICD-10-CM | POA: Diagnosis not present

## 2020-02-09 DIAGNOSIS — M19042 Primary osteoarthritis, left hand: Secondary | ICD-10-CM | POA: Diagnosis not present

## 2020-02-09 NOTE — Telephone Encounter (Signed)
Spoke with patient. Patient reports she can't go on like this much longer. She reports she is short of breath. Dry cough noted on phone. Patient reports she can't lay down at night and has sat upright in her chair because she can't breathe. She reports she's not getting air in her airway. She reports her oxygen isn't doing her any good. Patient is able to carry on lengthy conversation without shortness of breath being noted.   Patient has had no weight gain or edema. She has lost 20 lbs in 2 months per patient. She reports her tongue is coated in something and her throat doesn't feel well.   Patient has called PCP and reports no available appointments. Offered appointment in office on 12/29 but patient refused. Due to no sooner appointments and patient complaints of severe shortness of breath, patient advised to go to the ER. She reports she went to the ER 2 days ago and they took her to the lobby to wait. She refuses to go to the ER or to an urgent care.   Will route to MD for review.

## 2020-02-09 NOTE — Telephone Encounter (Signed)
° °  Pt c/o Shortness Of Breath: STAT if SOB developed within the last 24 hours or pt is noticeably SOB on the phone  1. Are you currently SOB (can you hear that pt is SOB on the phone)? Yes  2. How long have you been experiencing SOB? Since last week   3. Are you SOB when sitting or when up moving around? All the time   4. Are you currently experiencing any other symptoms? SOB is getting worst    Pt is still have SOB and she said its getting worst and she can't get any air go down her throat

## 2020-02-09 NOTE — Telephone Encounter (Signed)
Notified Liji w/MD response..../lmb 

## 2020-02-09 NOTE — Telephone Encounter (Signed)
Schedule APPov Paulino Cork  

## 2020-02-10 NOTE — Telephone Encounter (Signed)
Spoke with pt, Follow up scheduled  

## 2020-02-11 ENCOUNTER — Telehealth: Payer: Self-pay | Admitting: Internal Medicine

## 2020-02-11 DIAGNOSIS — Z7952 Long term (current) use of systemic steroids: Secondary | ICD-10-CM | POA: Diagnosis not present

## 2020-02-11 DIAGNOSIS — N179 Acute kidney failure, unspecified: Secondary | ICD-10-CM | POA: Diagnosis not present

## 2020-02-11 DIAGNOSIS — I480 Paroxysmal atrial fibrillation: Secondary | ICD-10-CM | POA: Diagnosis not present

## 2020-02-11 DIAGNOSIS — Z9181 History of falling: Secondary | ICD-10-CM | POA: Diagnosis not present

## 2020-02-11 DIAGNOSIS — M19042 Primary osteoarthritis, left hand: Secondary | ICD-10-CM | POA: Diagnosis not present

## 2020-02-11 DIAGNOSIS — R29898 Other symptoms and signs involving the musculoskeletal system: Secondary | ICD-10-CM | POA: Diagnosis not present

## 2020-02-11 DIAGNOSIS — J449 Chronic obstructive pulmonary disease, unspecified: Secondary | ICD-10-CM | POA: Diagnosis not present

## 2020-02-11 DIAGNOSIS — E538 Deficiency of other specified B group vitamins: Secondary | ICD-10-CM | POA: Diagnosis not present

## 2020-02-11 DIAGNOSIS — Z7902 Long term (current) use of antithrombotics/antiplatelets: Secondary | ICD-10-CM | POA: Diagnosis not present

## 2020-02-11 DIAGNOSIS — I130 Hypertensive heart and chronic kidney disease with heart failure and stage 1 through stage 4 chronic kidney disease, or unspecified chronic kidney disease: Secondary | ICD-10-CM | POA: Diagnosis not present

## 2020-02-11 DIAGNOSIS — Z87891 Personal history of nicotine dependence: Secondary | ICD-10-CM | POA: Diagnosis not present

## 2020-02-11 DIAGNOSIS — R42 Dizziness and giddiness: Secondary | ICD-10-CM | POA: Diagnosis not present

## 2020-02-11 DIAGNOSIS — M19041 Primary osteoarthritis, right hand: Secondary | ICD-10-CM | POA: Diagnosis not present

## 2020-02-11 DIAGNOSIS — F32A Depression, unspecified: Secondary | ICD-10-CM | POA: Diagnosis not present

## 2020-02-11 DIAGNOSIS — B370 Candidal stomatitis: Secondary | ICD-10-CM | POA: Diagnosis not present

## 2020-02-11 DIAGNOSIS — I5031 Acute diastolic (congestive) heart failure: Secondary | ICD-10-CM | POA: Diagnosis not present

## 2020-02-11 DIAGNOSIS — I252 Old myocardial infarction: Secondary | ICD-10-CM | POA: Diagnosis not present

## 2020-02-11 DIAGNOSIS — R634 Abnormal weight loss: Secondary | ICD-10-CM | POA: Diagnosis not present

## 2020-02-11 DIAGNOSIS — E559 Vitamin D deficiency, unspecified: Secondary | ICD-10-CM | POA: Diagnosis not present

## 2020-02-11 DIAGNOSIS — Z7982 Long term (current) use of aspirin: Secondary | ICD-10-CM | POA: Diagnosis not present

## 2020-02-11 DIAGNOSIS — N183 Chronic kidney disease, stage 3 unspecified: Secondary | ICD-10-CM | POA: Diagnosis not present

## 2020-02-11 DIAGNOSIS — I2510 Atherosclerotic heart disease of native coronary artery without angina pectoris: Secondary | ICD-10-CM | POA: Diagnosis not present

## 2020-02-11 MED ORDER — NYSTATIN 100000 UNIT/ML MT SUSP: 500000.0000 [IU] | Freq: Four times a day (QID) | OROMUCOSAL | 0 refills | Status: DC

## 2020-02-11 NOTE — Telephone Encounter (Signed)
Okay.  Thanks.

## 2020-02-11 NOTE — Telephone Encounter (Signed)
Notified Jessica w/MD response.Marland KitchenJohny Coffey

## 2020-02-11 NOTE — Telephone Encounter (Signed)
[name] with [name] called and is requesting a verbal order for a nursing eval.

## 2020-02-11 NOTE — Telephone Encounter (Signed)
   Patient calling to request additional medication for thrush. She states her mouth is "coated" and sore

## 2020-02-11 NOTE — Telephone Encounter (Signed)
Okay.  Done.  Thanks 

## 2020-02-12 NOTE — Telephone Encounter (Signed)
Notified pt MD sent rx to [facility]../lmb 

## 2020-02-13 ENCOUNTER — Ambulatory Visit: Payer: Medicare Other | Admitting: Cardiology

## 2020-02-13 ENCOUNTER — Other Ambulatory Visit: Payer: Self-pay

## 2020-02-13 ENCOUNTER — Encounter: Payer: Self-pay | Admitting: Cardiology

## 2020-02-13 VITALS — BP 162/90 | HR 114 | Ht 64.0 in | Wt 116.4 lb

## 2020-02-13 DIAGNOSIS — I2510 Atherosclerotic heart disease of native coronary artery without angina pectoris: Secondary | ICD-10-CM

## 2020-02-13 DIAGNOSIS — I10 Essential (primary) hypertension: Secondary | ICD-10-CM

## 2020-02-13 DIAGNOSIS — N1832 Chronic kidney disease, stage 3b: Secondary | ICD-10-CM

## 2020-02-13 DIAGNOSIS — I5031 Acute diastolic (congestive) heart failure: Secondary | ICD-10-CM

## 2020-02-13 DIAGNOSIS — E785 Hyperlipidemia, unspecified: Secondary | ICD-10-CM | POA: Diagnosis not present

## 2020-02-13 DIAGNOSIS — I480 Paroxysmal atrial fibrillation: Secondary | ICD-10-CM | POA: Diagnosis not present

## 2020-02-13 DIAGNOSIS — J449 Chronic obstructive pulmonary disease, unspecified: Secondary | ICD-10-CM

## 2020-02-13 DIAGNOSIS — I161 Hypertensive emergency: Secondary | ICD-10-CM

## 2020-02-13 DIAGNOSIS — Z9861 Coronary angioplasty status: Secondary | ICD-10-CM

## 2020-02-13 MED ORDER — DILTIAZEM HCL ER COATED BEADS 120 MG PO CP24: 120.0000 mg | ORAL_CAPSULE | Freq: Every day | ORAL | 3 refills | Status: DC

## 2020-02-13 NOTE — Assessment & Plan Note (Signed)
CRI 3B- SCr 1.5, GFR 32

## 2020-02-13 NOTE — Assessment & Plan Note (Signed)
I suggested she resume Torsemide- she agrees to take 50mg  daily

## 2020-02-13 NOTE — Patient Instructions (Signed)
Medication Instructions:  START- Diltiazem 120 mg by mouth daily  *If you need a refill on your cardiac medications before your next appointment, please call your pharmacy*   Lab Work: None Ordered   Testing/Procedures: None Ordered   Follow-Up: At [facility], you and your health needs are our priority.  As part of our continuing mission to provide you with exceptional heart care, we have created designated Provider Care Teams.  These Care Teams include your primary Cardiologist (physician) and Advanced Practice Providers (APPs -  Physician Assistants and Nurse Practitioners) who all work together to provide you with the care you need, when you need it.  We recommend signing up for the patient portal called "MyChart".  Sign up information is provided on this After Visit Summary.  MyChart is used to connect with patients for Virtual Visits (Telemedicine).  Patients are able to view lab/test results, encounter notes, upcoming appointments, etc.  Non-urgent messages can be sent to your provider as well.   To learn more about what you can do with MyChart, go to [email or web address].    Your next appointment:   January 3rd @ 1:20 pm  The format for your next appointment:   In Person  Provider:   You may see Kirk Ruths, MD or one of the following Advanced Practice Providers on your designated Care Team:    Kerin Ransom, PA-C  [place], [place]  Coletta Memos, [place]

## 2020-02-13 NOTE — Assessment & Plan Note (Signed)
B/P up- Torsemide resumed, Diltiazem added

## 2020-02-13 NOTE — Progress Notes (Signed)
Cardiology Office Note:    Date:  02/13/2020   ID:  [name], DOB September 23, 1935, MRN [id number]  PCP:  Cassandria Anger, MD  Cardiologist:  Kirk Ruths, MD  Electrophysiologist:  None   Referring MD: Cassandria Anger, MD   CC: increased SOB  History of Present Illness:    [name] is a pleasant 84 y.o. female who lives in her own home with a hx of PAF who has been followed by Dr Stanford Breed for years.  She had been controlled on flecainide.  The patient has declined anticoagulation previously.  She was in her usual state of health and doing pretty well at home until June 2021.  She presented 08/26/2019 with a lateral ST EMI.  At cath she had a thrombotic occlusion of a diagonal and underwent angioplasty.  Echocardiogram showed an ejection fraction of 60 to 65% with mild LVH and moderate left atrial enlargement.  Flecainide was stopped at that time as she now has a diagnosis of coronary disease.  She is also suspected to have interstitial lung disease.  She has a pulmonary evaluation pending.  Other medical issues include hypertension, dyslipidemia, and chronic renal insufficiency stage IIIb.  After her admission in June she has been seen in the office and at the hospital several times.  She had an office visit September 10, 2019.  She was admitted a week later for 2 days with hypertensive crisis.  She was seen in the office on 10/28/2019 and felt to be in diastolic heart failure and her diuretics were adjusted.  She was seen again November 05, 2019 and November 14, 2019.  No significant changes were made then, she was in sinus rhythm on those visits but did complain of intermittent palpitations.  Patient presents today with complaints of increasing shortness of breath.  Her EKG today shows atrial fibrillation with a ventricular response of 114.  She admitted to me that she is not taking her torsemide, I am not sure why that is.  She does use oxygen at home and says that seems to help.   She has some trace lower extremity edema on exam.  Her blood pressure is somewhat elevated 160/90.   Past Medical History:  Diagnosis Date  . Anxiety   . Arthritis   . Atrial fibrillation ([place])   . B12 deficiency   . COPD (chronic obstructive pulmonary disease) ([place])   . Depression   . GERD (gastroesophageal reflux disease)   . Hypertension   . Osteoarthrosis, hand    both hands  . Peptic ulcer, unspecified site, unspecified as acute or chronic, without mention of hemorrhage, perforation, or obstruction   . Pneumonia   . PONV (postoperative nausea and vomiting)   . Vitamin D deficiency disease     Past Surgical History:  Procedure Laterality Date  . BLADDER SURGERY     Bladder tack and intestines  . CORONARY/GRAFT ACUTE MI REVASCULARIZATION N/A 08/26/2019   Procedure: Coronary/Graft Acute MI Revascularization;  Surgeon: Sherren Mocha, MD;  Location: [facility];  Service: Cardiovascular;  Laterality: N/A;  . CYSTOCELE REPAIR    . CYSTOSCOPY WITH RETROGRADE PYELOGRAM, URETEROSCOPY AND STENT PLACEMENT Right 09/11/2016   Procedure: CYSTOSCOPY WITH RETROGRADE PYELOGRAM, URETEROSCOPY AND STENT REPLACEMENT;  Surgeon: Cleon Gustin, MD;  Location: [facility];  Service: Urology;  Laterality: Right;  . CYSTOSCOPY WITH STENT PLACEMENT Right 08/11/2016   Procedure: CYSTOSCOPY, URETERSCOPY,  RIGHT RETROGRADE WITH RIGHT STENT PLACEMENT;  Surgeon: Festus Aloe, MD;  Location: [facility];  Service: Urology;  Laterality: Right;  . SPLENECTOMY      Current Medications: No outpatient medications have been marked as taking for the 02/13/20 encounter (Office Visit) with Erlene Quan, PA-C.     Allergies:   Amoxicillin, Codeine, Levaquin [levofloxacin in d5w], and Other   Social History   Socioeconomic History  . Marital status: Divorced    Spouse name: Not on file  . Number of children: 2  . Years of education: Not on file  . Highest education level: Not on file  Occupational  History  . Occupation: Airline pilot: [employer]  Tobacco Use  . Smoking status: Former Smoker    Packs/day: 1.00    Types: Cigarettes    Quit date: 06-05-2008    Years since quitting: 11.9  . Smokeless tobacco: Never Used  . Tobacco comment: pt unsure how many years she smoked  Vaping Use  . Vaping Use: Never used  Substance and Sexual Activity  . Alcohol use: No  . Drug use: No  . Sexual activity: Not Currently  Other Topics Concern  . Not on file  Social History Narrative   Retired, works part time   Divorced   Former Smoker   1- Son alcoholic abusive   Daughter died in 06/06/06   Social Determinants of Health   Financial Resource Strain: High Risk  . Difficulty of Paying Living Expenses: Very hard  Food Insecurity: Food Insecurity Present  . Worried About Charity fundraiser in the Last Year: Often true  . Ran Out of Food in the Last Year: Often true  Transportation Needs: Unmet Transportation Needs  . Lack of Transportation (Medical): Yes  . Lack of Transportation (Non-Medical): Yes  Physical Activity: Unknown  . Days of Exercise per Week: Patient refused  . Minutes of Exercise per Session: Not on file  Stress: No Stress Concern Present  . Feeling of Stress : Not at all  Social Connections: Not on file     Family History: The patient's family history includes Arthritis in an other family member; Coronary artery disease in an other family member; Diabetes in her daughter; Heart disease in her father; Hypertension in an other family member.  ROS:   Please see the history of present illness.     All other systems reviewed and are negative.  EKGs/Labs/Other Studies Reviewed:    The following studies were reviewed today:  Echo 09/16/2019- IMPRESSIONS    1. Left ventricular ejection fraction, by estimation, is 50 to 55%. The  left ventricle has low normal function. The left ventricle has no regional  wall motion abnormalities. There is mild concentric left  ventricular  hypertrophy. Left ventricular  diastolic function could not be evaluated.  2. Right ventricular systolic function is normal. The right ventricular  size is normal. Tricuspid regurgitation signal is inadequate for assessing  PA pressure.  3. Left atrial size was mildly dilated.  4. The mitral valve is normal in structure. Trivial mitral valve  regurgitation. No evidence of mitral stenosis.  5. The aortic valve is tricuspid. Aortic valve regurgitation is mild. No  aortic stenosis is present.  6. The inferior vena cava is dilated in size with <50% respiratory  variability, suggesting right atrial pressure of 15 mmHg.   EKG:  EKG is ordered today.  The ekg ordered today demonstrates AF with VR 114  Recent Labs: 09/15/2019: B Natriuretic Peptide 2,581.2 09/16/2019: TSH 3.830 09/17/2019: ALT 16 09/18/2019: Hemoglobin 11.5;  Magnesium 1.7; Platelets 224 11/07/2019: BUN 22; Creatinine, Ser 1.52; Potassium 4.2; Sodium 143  Recent Lipid Panel    Component Value Date/Time   CHOL 239 (H) 08/26/2019 1240   TRIG 155 (H) 08/26/2019 1240   HDL 54 08/26/2019 1240   CHOLHDL 4.4 08/26/2019 1240   VLDL 31 08/26/2019 1240   LDLCALC 154 (H) 08/26/2019 1240   LDLDIRECT 144.3 01/31/2011 0905    Physical Exam:    VS:  BP (!) 162/90   Pulse (!) 114   Ht 5\' 4"  (1.626 m)   Wt 116 lb 6.4 oz (52.8 kg)   BMI 19.98 kg/m     Wt Readings from Last 3 Encounters:  02/13/20 116 lb 6.4 oz (52.8 kg)  01/26/20 114 lb 9.6 oz (52 kg)  01/14/20 120 lb 6.4 oz (54.6 kg)     GEN: Cachectic Caucasian female, on O2,  in no acute distress at rest HEENT: Normal NECK: No JVD; No carotid bruits CARDIAC: irregularly irregular, no murmurs, rubs, gallops RESPIRATORY:  Velcro crackles bilaterally ABDOMEN: Soft, non-distended MUSCULOSKELETAL:  Trace-1+ LE edema; No deformity  SKIN: Warm and dry NEUROLOGIC:  Alert and oriented x 3 PSYCHIATRIC:  Normal affect   ASSESSMENT:    Acute diastolic CHF  (congestive heart failure) ([place]) I suggested she resume Torsemide- she agrees to take 50mg  daily  Paroxysmal atrial fibrillation ([place]) Long history of PAF previously controlled with Flecainide which was discontinued in June 2021 secondary to documented CAD. CHADs VASc=6- patient has declined anticoagulation  She now has recurrent AF with increased VR that is most likely contributing to her decline. Add Diltiazem CD 120 mg- ? Consider EP evaluation-? Tikosyn  CAD S/P percutaneous coronary angioplasty Dx1 POBA 08/26/2019 secondary to thrombotic occlusion-STEMI. No other significant CAD, normal LVF  COPD with chronic bronchitis (HCC) Suspected ILD- she has an appointment with pulmonary 02/24/2020  CKD (chronic kidney disease), stage III (HCC) CRI 3B- SCr 1.5, GFR 32  Essential hypertension B/P up- Torsemide resumed, Diltiazem added  PLAN:    Resume Torsemide 50mg  daily.  Start Diltiazem CD 120 mg daily.  Keep f/u with Dr Stanford Breed and Dr Loanne Drilling as scheduled.    Medication Adjustments/Labs and Tests Ordered: Current medicines are reviewed at length with the patient today.  Concerns regarding medicines are outlined above.  No orders of the defined types were placed in this encounter.  Meds ordered this encounter  Medications  . diltiazem (CARDIZEM CD) 120 MG 24 hr capsule    Sig: Take 1 capsule (120 mg total) by mouth daily.    Dispense:  90 capsule    Refill:  3    Patient Instructions  Medication Instructions:  START- Diltiazem 120 mg by mouth daily  *If you need a refill on your cardiac medications before your next appointment, please call your pharmacy*   Lab Work: None Ordered   Testing/Procedures: None Ordered   Follow-Up: At [facility], you and your health needs are our priority.  As part of our continuing mission to provide you with exceptional heart care, we have created designated Provider Care Teams.  These Care Teams include your primary Cardiologist  (physician) and Advanced Practice Providers (APPs -  Physician Assistants and Nurse Practitioners) who all work together to provide you with the care you need, when you need it.  We recommend signing up for the patient portal called "MyChart".  Sign up information is provided on this After Visit Summary.  MyChart is used to connect with patients for Virtual  Visits (Telemedicine).  Patients are able to view lab/test results, encounter notes, upcoming appointments, etc.  Non-urgent messages can be sent to your provider as well.   To learn more about what you can do with MyChart, go to [email or web address].    Your next appointment:   January 3rd @ 1:20 pm  The format for your next appointment:   In Person  Provider:   You may see Kirk Ruths, MD or one of the following Advanced Practice Providers on your designated Care Team:    Kerin Ransom, PA-C  [place], [place]  Coletta Memos, FNP         Signed, Kerin Ransom, [place]  02/13/2020 2:37 PM    [place]

## 2020-02-13 NOTE — Assessment & Plan Note (Signed)
Long history of PAF previously controlled with Flecainide which was discontinued in June 2021 secondary to documented CAD. CHADs VASc=6- patient has declined anticoagulation  She now has recurrent AF with increased VR that is most likely contributing to her decline. Add Diltiazem CD 120 mg- ? Consider EP evaluation-? Tikosyn

## 2020-02-13 NOTE — Assessment & Plan Note (Signed)
Dx1 POBA 08/26/2019 secondary to thrombotic occlusion-STEMI. No other significant CAD, normal LVF

## 2020-02-13 NOTE — Assessment & Plan Note (Signed)
Suspected ILD- she has an appointment with pulmonary 02/24/2020

## 2020-02-16 ENCOUNTER — Telehealth: Payer: Self-pay

## 2020-02-16 ENCOUNTER — Telehealth: Payer: Self-pay | Admitting: Cardiology

## 2020-02-16 ENCOUNTER — Telehealth (INDEPENDENT_AMBULATORY_CARE_PROVIDER_SITE_OTHER): Payer: Medicare Other | Admitting: Family

## 2020-02-16 ENCOUNTER — Telehealth: Payer: Self-pay | Admitting: Internal Medicine

## 2020-02-16 DIAGNOSIS — Z7982 Long term (current) use of aspirin: Secondary | ICD-10-CM

## 2020-02-16 DIAGNOSIS — Z9181 History of falling: Secondary | ICD-10-CM

## 2020-02-16 DIAGNOSIS — I480 Paroxysmal atrial fibrillation: Secondary | ICD-10-CM

## 2020-02-16 DIAGNOSIS — N179 Acute kidney failure, unspecified: Secondary | ICD-10-CM

## 2020-02-16 DIAGNOSIS — J449 Chronic obstructive pulmonary disease, unspecified: Secondary | ICD-10-CM

## 2020-02-16 DIAGNOSIS — I5031 Acute diastolic (congestive) heart failure: Secondary | ICD-10-CM

## 2020-02-16 DIAGNOSIS — E538 Deficiency of other specified B group vitamins: Secondary | ICD-10-CM

## 2020-02-16 DIAGNOSIS — M19041 Primary osteoarthritis, right hand: Secondary | ICD-10-CM

## 2020-02-16 DIAGNOSIS — F411 Generalized anxiety disorder: Secondary | ICD-10-CM

## 2020-02-16 DIAGNOSIS — Z87891 Personal history of nicotine dependence: Secondary | ICD-10-CM

## 2020-02-16 DIAGNOSIS — I2510 Atherosclerotic heart disease of native coronary artery without angina pectoris: Secondary | ICD-10-CM

## 2020-02-16 DIAGNOSIS — R42 Dizziness and giddiness: Secondary | ICD-10-CM

## 2020-02-16 DIAGNOSIS — R29898 Other symptoms and signs involving the musculoskeletal system: Secondary | ICD-10-CM

## 2020-02-16 DIAGNOSIS — I252 Old myocardial infarction: Secondary | ICD-10-CM

## 2020-02-16 DIAGNOSIS — R634 Abnormal weight loss: Secondary | ICD-10-CM

## 2020-02-16 DIAGNOSIS — N183 Chronic kidney disease, stage 3 unspecified: Secondary | ICD-10-CM

## 2020-02-16 DIAGNOSIS — Z7952 Long term (current) use of systemic steroids: Secondary | ICD-10-CM

## 2020-02-16 DIAGNOSIS — F32A Depression, unspecified: Secondary | ICD-10-CM

## 2020-02-16 DIAGNOSIS — M19042 Primary osteoarthritis, left hand: Secondary | ICD-10-CM

## 2020-02-16 DIAGNOSIS — E559 Vitamin D deficiency, unspecified: Secondary | ICD-10-CM

## 2020-02-16 DIAGNOSIS — B370 Candidal stomatitis: Secondary | ICD-10-CM

## 2020-02-16 DIAGNOSIS — Z7902 Long term (current) use of antithrombotics/antiplatelets: Secondary | ICD-10-CM

## 2020-02-16 DIAGNOSIS — I130 Hypertensive heart and chronic kidney disease with heart failure and stage 1 through stage 4 chronic kidney disease, or unspecified chronic kidney disease: Secondary | ICD-10-CM

## 2020-02-16 NOTE — Telephone Encounter (Signed)
Pt c/o medication issue:   1. Name of Medication:  dilTIAZem HCl Coated Beads    2. How are you currently taking this medication (dosage and times per day)? Stopped taking  3. Are you having a reaction (difficulty breathing--STAT)? yes  4. What is your medication issue? Patient states that she has been throwing up for 3 days ever since she started this medication so she has decided to stop taking it.

## 2020-02-16 NOTE — Progress Notes (Signed)
[name] is a 84 y.o. female with the following history as recorded in EpicCare:  Patient Active Problem List   Diagnosis Date Noted  . Stomatitis 01/21/2020  . Edema 12/03/2019  . AKI (acute kidney injury) ([place])   . Acute diastolic CHF (congestive heart failure) ([place])   . Hypokalemia 09/15/2019  . CKD (chronic kidney disease), stage III ([place]) 09/15/2019  . Elevated troponin 09/15/2019  . Hypertensive emergency 09/15/2019  . Dyslipidemia, goal LDL below 70 08/27/2019  . Acute ST elevation myocardial infarction (STEMI) of lateral wall ([place]) 08/26/2019  . Heart attack ([place]) 08/26/2019  . Ankle pain, left 07/23/2019  . Intertrigo 02/05/2019  . Thrush 01/10/2019  . Shingles 09/16/2018  . CAD S/P percutaneous coronary angioplasty 05/06/2018  . Esophageal dysmotility 01/11/2018  . Weight gain 01/01/2018  . Abdominal bloating 01/01/2018  . Hyperkalemia 06/04/2017  . Grief at loss of child 04/02/2017  . Adenopathy, cervical 11/29/2016  . Dysuria 10/31/2016  . Vertigo 10/31/2016  . Ureteral calculus 09/11/2016  . CAP (community acquired pneumonia) 08/30/2016  . Fatigue 08/24/2016  . Sensation of pressure in bladder area 08/24/2016  . Kidney stone 08/13/2016  . Flank pain 08/10/2016  . Right upper quadrant abdominal tenderness without rebound tenderness 08/10/2016  . Leukocytosis 08/10/2016  . Acute abdomen 08/10/2016  . Intractable nausea and vomiting 08/10/2016  . ARF (acute renal failure) ([place]) 08/10/2016  . Urolithiasis 08/10/2016  . Abdominal pain 08/10/2016  . Anemia 05/19/2016  . Generalized anxiety disorder 01/14/2016  . Dysphagia 04/16/2015  . Dry skin dermatitis 12/28/2014  . Atrophic vaginitis 05/26/2014  . Varicose veins of both lower extremities 12/12/2013  . Restless leg syndrome 03/17/2013  . Impacted cerumen of left ear 03/17/2013  . Osteoarthritis, hand 09/05/2011  . Epistaxis, recurrent 08/17/2011  . Well adult exam 01/31/2011  . Low back pain  01/31/2011  . Palpitations 05/24/2010  . Cerumen impaction 02/23/2010  . PHARYNGITIS, ACUTE 02/23/2010  . SINUSITIS, ACUTE 01/07/2010  . SOB (shortness of breath) 09/23/2009  . BRONCHITIS, ACUTE 08/03/2009  . Osteoarthritis 07/06/2009  . TOBACCO USE, QUIT 03/08/2009  . ALLERGIC RHINITIS 11/06/2008  . NAUSEA ALONE 08/19/2008  . DIARRHEA 08/19/2008  . Essential hypertension 07/15/2008  . EMPHYSEMA 07/15/2008  . Peptic ulcer 07/15/2008  . ARTHRITIS 07/15/2008  . HEADACHE 05/25/2008  . SYNCOPE 03/23/2008  . WEIGHT LOSS, ABNORMAL 03/23/2008  . HEARING IMPAIRMENT 12/23/2007  . B12 deficiency 10/14/2007  . Malaise 10/14/2007  . INSOMNIA, PERSISTENT 07/31/2007  . Anxiety state 03/01/2007  . DEPRESSION 03/01/2007  . Paroxysmal atrial fibrillation ([place]) 02/26/2007  . Vitamin D deficiency 02/19/2007  . GERD 02/19/2007  . COPD with chronic bronchitis ([place]) 08/07/2006    Current Outpatient Medications  Medication Sig Dispense Refill  . aspirin 81 MG chewable tablet Chew 1 tablet (81 mg total) by mouth daily.    . Biotin 1000 MCG tablet Take 1,000 mcg by mouth daily.    . Cholecalciferol ([employer] VITAMIN D3) 1000 UNITS capsule Take 1,000 Units by mouth daily.      . clonazePAM (KLONOPIN) 0.5 MG tablet TAKE 1 TABLET BY MOUTH AT BEDTIME AS NEEDED FOR LEG CRAMPS 90 tablet 1  . clopidogrel (PLAVIX) 75 MG tablet Take 1 tablet (75 mg total) by mouth daily with breakfast. 90 tablet 3  . cyanocobalamin 1000 MCG tablet Take 1,000 mcg by mouth daily.    Marland Kitchen docusate sodium (COLACE) 100 MG capsule Take 1 capsule (100 mg total) by mouth 2 (two) times daily. 10 capsule  0  . Fluticasone-Umeclidin-Vilant (TRELEGY ELLIPTA) 100-62.5-25 MCG/INH AEPB Inhale 1 Inhaler into the lungs daily. 3 each 3  . isosorbide mononitrate (IMDUR) 30 MG 24 hr tablet Take 1 tablet (30 mg total) by mouth daily. 30 tablet 0  . ketoconazole (NIZORAL) 2 % cream APPLY TOPICALLY TO THE AFFECTED AREA DAILY (Patient taking differently:  Apply 1 application topically daily as needed for irritation. ) 45 g 1  . metoprolol tartrate (LOPRESSOR) 25 MG tablet Take 1 tablet (25 mg total) by mouth 2 (two) times daily. 180 tablet 3  . nitroGLYCERIN (NITROSTAT) 0.4 MG SL tablet Place 1 tablet (0.4 mg total) under the tongue every 5 (five) minutes as needed for chest pain. 25 tablet 0  . nystatin (MYCOSTATIN) 100000 UNIT/ML suspension Take 5 mLs (500,000 Units total) by mouth 4 (four) times daily. Use for 10 days 200 mL 0  . pantoprazole (PROTONIX) 40 MG tablet Take 1 tablet (40 mg total) by mouth 2 (two) times daily before a meal. 180 tablet 3  . rosuvastatin (CRESTOR) 10 MG tablet Take 1 tablet (10 mg total) by mouth daily. 90 tablet 3  . tobramycin (TOBREX) 0.3 % ophthalmic solution Place 1 drop into both eyes every 6 (six) hours.     . torsemide (DEMADEX) 100 MG tablet Take 1 tablet (100 mg total) by mouth daily. (Patient not taking: Reported on 01/26/2020) 90 tablet 1   No current facility-administered medications for this visit.    Allergies: Amoxicillin, Codeine, Levaquin [levofloxacin in d5w], and Other  Past Medical History:  Diagnosis Date  . Anxiety   . Arthritis   . Atrial fibrillation ([place])   . B12 deficiency   . COPD (chronic obstructive pulmonary disease) ([place])   . Depression   . GERD (gastroesophageal reflux disease)   . Hypertension   . Osteoarthrosis, hand    both hands  . Peptic ulcer, unspecified site, unspecified as acute or chronic, without mention of hemorrhage, perforation, or obstruction   . Pneumonia   . PONV (postoperative nausea and vomiting)   . Vitamin D deficiency disease     Past Surgical History:  Procedure Laterality Date  . BLADDER SURGERY     Bladder tack and intestines  . CORONARY/GRAFT ACUTE MI REVASCULARIZATION N/A 08/26/2019   Procedure: Coronary/Graft Acute MI Revascularization;  Surgeon: Sherren Mocha, MD;  Location: [facility];  Service: Cardiovascular;  Laterality: N/A;  .  CYSTOCELE REPAIR    . CYSTOSCOPY WITH RETROGRADE PYELOGRAM, URETEROSCOPY AND STENT PLACEMENT Right 09/11/2016   Procedure: CYSTOSCOPY WITH RETROGRADE PYELOGRAM, URETEROSCOPY AND STENT REPLACEMENT;  Surgeon: Cleon Gustin, MD;  [facility];  Service: Urology;  Laterality: Right;  . CYSTOSCOPY WITH STENT PLACEMENT Right 08/11/2016   Procedure: CYSTOSCOPY, URETERSCOPY,  RIGHT RETROGRADE WITH RIGHT STENT PLACEMENT;  Surgeon: Festus Aloe, MD;  [facility];  Service: Urology;  Laterality: Right;  . SPLENECTOMY      Family History  Problem Relation Age of Onset  . Heart disease Father   . Arthritis Other        Family history of  . Coronary artery disease Other        Family history of  . Hypertension Other        Family history of  . Diabetes Daughter        13 died    Social History   Tobacco Use  . Smoking status: Former Smoker    Packs/day: 1.00    Types: Cigarettes  Quit date: 2010    Years since quitting: 11.9  . Smokeless tobacco: Never Used  . Tobacco comment: pt unsure how many years she smoked  Substance Use Topics  . Alcohol use: No    Subjective:   I connected with [name] on 02/16/20 at  3:20 PM EST by a telephone call and verified that I am speaking with the correct person using two identifiers.   I discussed the limitations of evaluation and management by telemedicine and the availability of in person appointments. The patient expressed understanding and agreed to proceed. Provider in office/ patient is at home; provider and patient are only 2 people on telephone call.   Patient called with same complaint she has expressed previously- similar complaints during the 2 visits in November- "just not feeling well/ concern for losing weight/ worsening mobility;" denies any sudden or acute changes today; notes she cannot physically come to the office at this time as she does not have portable oxygen- planning to have her PCP order this for her; does  have home health working with her but unsure of the planned follow-up schedule;     Objective:  There were no vitals filed for this visit.  Lungs: Respirations unlabored;  Neurologic: Alert and oriented; speech intact;   Assessment:  1. Weight loss     Plan:  Patient is concerned about unexplained weight loss- she notes her weight today was 114- it was 116 at cardiologist on Friday and documented at 114 at [facility] on 01/26/2020; she is encouraged to try and schedule in office visit with her PCP to discuss her concerns; she is also asked to reach out to her home health agency to have a nurse follow-up with her at her home for in person evaluation- she agrees to this; she will follow-up with her PCP as well regarding order for portable oxygen;  Time spent 11 minutes No follow-ups on file.  No orders of the defined types were placed in this encounter.   Requested Prescriptions    No prescriptions requested or ordered in this encounter

## 2020-02-16 NOTE — Telephone Encounter (Signed)
Mary from [facility] called   Needs a verbal order for nurse visits to be twice a week for 4 weeks and once a week for 3 weeks.    Please call her back at [phone]

## 2020-02-16 NOTE — Telephone Encounter (Signed)
Returned the call to the patient. She stated that since starting the Diltiazem 120 mg once daily, she started getting nauseous and developed a bad rash. She stopped the medication yesterday and stated that today she feels better.   She has been advised that we will let Dr. Stanford Breed know and she stated that she did not want anymore medications because she already takes enough.   She has a follow up appointment with Dr. Stanford Breed on 03/08/20

## 2020-02-16 NOTE — Telephone Encounter (Signed)
Pt states she has been having n/v x2 wks that is not getting better (could be heard actively gagging during call), nosebleeds & 20lb wt loss.  Pt refused to be sent to [facility]health for triage & demanding virtual visit with PCP.  PCP has no availability until next week.  Offered visit with different provider & pt reluctantly agreed.

## 2020-02-16 NOTE — Telephone Encounter (Signed)
Schedule APPov [name]  

## 2020-02-16 NOTE — Telephone Encounter (Signed)
Spoke with pt, she will not make an appointment at this time. She will stop the diltiazem and will wait for the appointment with dr Stanford Breed in January. She will call back with issues or concerns.

## 2020-02-17 ENCOUNTER — Telehealth: Payer: Self-pay | Admitting: Internal Medicine

## 2020-02-17 DIAGNOSIS — Z7902 Long term (current) use of antithrombotics/antiplatelets: Secondary | ICD-10-CM | POA: Diagnosis not present

## 2020-02-17 DIAGNOSIS — N179 Acute kidney failure, unspecified: Secondary | ICD-10-CM | POA: Diagnosis not present

## 2020-02-17 DIAGNOSIS — R29898 Other symptoms and signs involving the musculoskeletal system: Secondary | ICD-10-CM | POA: Diagnosis not present

## 2020-02-17 DIAGNOSIS — I480 Paroxysmal atrial fibrillation: Secondary | ICD-10-CM | POA: Diagnosis not present

## 2020-02-17 DIAGNOSIS — Z87891 Personal history of nicotine dependence: Secondary | ICD-10-CM | POA: Diagnosis not present

## 2020-02-17 DIAGNOSIS — R42 Dizziness and giddiness: Secondary | ICD-10-CM | POA: Diagnosis not present

## 2020-02-17 DIAGNOSIS — E559 Vitamin D deficiency, unspecified: Secondary | ICD-10-CM | POA: Diagnosis not present

## 2020-02-17 DIAGNOSIS — F32A Depression, unspecified: Secondary | ICD-10-CM | POA: Diagnosis not present

## 2020-02-17 DIAGNOSIS — B370 Candidal stomatitis: Secondary | ICD-10-CM | POA: Diagnosis not present

## 2020-02-17 DIAGNOSIS — I5031 Acute diastolic (congestive) heart failure: Secondary | ICD-10-CM | POA: Diagnosis not present

## 2020-02-17 DIAGNOSIS — M19042 Primary osteoarthritis, left hand: Secondary | ICD-10-CM | POA: Diagnosis not present

## 2020-02-17 DIAGNOSIS — J449 Chronic obstructive pulmonary disease, unspecified: Secondary | ICD-10-CM | POA: Diagnosis not present

## 2020-02-17 DIAGNOSIS — E538 Deficiency of other specified B group vitamins: Secondary | ICD-10-CM | POA: Diagnosis not present

## 2020-02-17 DIAGNOSIS — Z7952 Long term (current) use of systemic steroids: Secondary | ICD-10-CM | POA: Diagnosis not present

## 2020-02-17 DIAGNOSIS — I130 Hypertensive heart and chronic kidney disease with heart failure and stage 1 through stage 4 chronic kidney disease, or unspecified chronic kidney disease: Secondary | ICD-10-CM | POA: Diagnosis not present

## 2020-02-17 DIAGNOSIS — Z7982 Long term (current) use of aspirin: Secondary | ICD-10-CM | POA: Diagnosis not present

## 2020-02-17 DIAGNOSIS — R634 Abnormal weight loss: Secondary | ICD-10-CM | POA: Diagnosis not present

## 2020-02-17 DIAGNOSIS — M19041 Primary osteoarthritis, right hand: Secondary | ICD-10-CM | POA: Diagnosis not present

## 2020-02-17 DIAGNOSIS — Z9181 History of falling: Secondary | ICD-10-CM | POA: Diagnosis not present

## 2020-02-17 DIAGNOSIS — I252 Old myocardial infarction: Secondary | ICD-10-CM | POA: Diagnosis not present

## 2020-02-17 DIAGNOSIS — I2510 Atherosclerotic heart disease of native coronary artery without angina pectoris: Secondary | ICD-10-CM | POA: Diagnosis not present

## 2020-02-17 DIAGNOSIS — N183 Chronic kidney disease, stage 3 unspecified: Secondary | ICD-10-CM | POA: Diagnosis not present

## 2020-02-17 NOTE — Telephone Encounter (Signed)
Okay.  Thanks.

## 2020-02-17 NOTE — Telephone Encounter (Signed)
Notified Mary w/MD response.Marland KitchenJohny Coffey

## 2020-02-17 NOTE — Telephone Encounter (Signed)
   Anissa from [facility] calling to report patient weight today is 108 Also reporting patient has nausea and vomiting   Anissa [phone]

## 2020-02-18 NOTE — Telephone Encounter (Signed)
Noted.  Please go to ER if sick.  Thanks

## 2020-02-18 NOTE — Telephone Encounter (Signed)
Notified Anissa w/MD response. Anissa states that was her first visit w/her, and she see that the pt has a lot going on. She advise pt to go to ER, but pt decline as well, but need to report it to the provider...Johny Chess

## 2020-02-20 ENCOUNTER — Telehealth: Payer: Self-pay | Admitting: Internal Medicine

## 2020-02-20 DIAGNOSIS — N179 Acute kidney failure, unspecified: Secondary | ICD-10-CM | POA: Diagnosis not present

## 2020-02-20 DIAGNOSIS — I5031 Acute diastolic (congestive) heart failure: Secondary | ICD-10-CM | POA: Diagnosis not present

## 2020-02-20 DIAGNOSIS — R42 Dizziness and giddiness: Secondary | ICD-10-CM | POA: Diagnosis not present

## 2020-02-20 DIAGNOSIS — I480 Paroxysmal atrial fibrillation: Secondary | ICD-10-CM | POA: Diagnosis not present

## 2020-02-20 DIAGNOSIS — N183 Chronic kidney disease, stage 3 unspecified: Secondary | ICD-10-CM | POA: Diagnosis not present

## 2020-02-20 DIAGNOSIS — M19042 Primary osteoarthritis, left hand: Secondary | ICD-10-CM | POA: Diagnosis not present

## 2020-02-20 DIAGNOSIS — Z9181 History of falling: Secondary | ICD-10-CM | POA: Diagnosis not present

## 2020-02-20 DIAGNOSIS — Z87891 Personal history of nicotine dependence: Secondary | ICD-10-CM | POA: Diagnosis not present

## 2020-02-20 DIAGNOSIS — Z7952 Long term (current) use of systemic steroids: Secondary | ICD-10-CM | POA: Diagnosis not present

## 2020-02-20 DIAGNOSIS — M19041 Primary osteoarthritis, right hand: Secondary | ICD-10-CM | POA: Diagnosis not present

## 2020-02-20 DIAGNOSIS — Z7902 Long term (current) use of antithrombotics/antiplatelets: Secondary | ICD-10-CM | POA: Diagnosis not present

## 2020-02-20 DIAGNOSIS — R29898 Other symptoms and signs involving the musculoskeletal system: Secondary | ICD-10-CM | POA: Diagnosis not present

## 2020-02-20 DIAGNOSIS — R634 Abnormal weight loss: Secondary | ICD-10-CM | POA: Diagnosis not present

## 2020-02-20 DIAGNOSIS — B370 Candidal stomatitis: Secondary | ICD-10-CM | POA: Diagnosis not present

## 2020-02-20 DIAGNOSIS — J449 Chronic obstructive pulmonary disease, unspecified: Secondary | ICD-10-CM | POA: Diagnosis not present

## 2020-02-20 DIAGNOSIS — I2510 Atherosclerotic heart disease of native coronary artery without angina pectoris: Secondary | ICD-10-CM | POA: Diagnosis not present

## 2020-02-20 DIAGNOSIS — E538 Deficiency of other specified B group vitamins: Secondary | ICD-10-CM | POA: Diagnosis not present

## 2020-02-20 DIAGNOSIS — I130 Hypertensive heart and chronic kidney disease with heart failure and stage 1 through stage 4 chronic kidney disease, or unspecified chronic kidney disease: Secondary | ICD-10-CM | POA: Diagnosis not present

## 2020-02-20 DIAGNOSIS — F32A Depression, unspecified: Secondary | ICD-10-CM | POA: Diagnosis not present

## 2020-02-20 DIAGNOSIS — E559 Vitamin D deficiency, unspecified: Secondary | ICD-10-CM | POA: Diagnosis not present

## 2020-02-20 DIAGNOSIS — Z7982 Long term (current) use of aspirin: Secondary | ICD-10-CM | POA: Diagnosis not present

## 2020-02-20 DIAGNOSIS — I252 Old myocardial infarction: Secondary | ICD-10-CM | POA: Diagnosis not present

## 2020-02-20 NOTE — Telephone Encounter (Signed)
   Janett Billow from [facility] states she is currently out with the patient and the patient states she has been itching out of control for the past 6 days. Janett Billow wondering what we recommended for this if it was OTC or if something needed to be sent in.  Janett Billow- [phone] Okay to LVM

## 2020-02-21 ENCOUNTER — Other Ambulatory Visit: Payer: Self-pay | Admitting: Internal Medicine

## 2020-02-23 ENCOUNTER — Telehealth: Payer: Self-pay | Admitting: Internal Medicine

## 2020-02-23 DIAGNOSIS — R634 Abnormal weight loss: Secondary | ICD-10-CM | POA: Diagnosis not present

## 2020-02-23 DIAGNOSIS — B370 Candidal stomatitis: Secondary | ICD-10-CM | POA: Diagnosis not present

## 2020-02-23 DIAGNOSIS — I130 Hypertensive heart and chronic kidney disease with heart failure and stage 1 through stage 4 chronic kidney disease, or unspecified chronic kidney disease: Secondary | ICD-10-CM | POA: Diagnosis not present

## 2020-02-23 DIAGNOSIS — Z7902 Long term (current) use of antithrombotics/antiplatelets: Secondary | ICD-10-CM | POA: Diagnosis not present

## 2020-02-23 DIAGNOSIS — N183 Chronic kidney disease, stage 3 unspecified: Secondary | ICD-10-CM | POA: Diagnosis not present

## 2020-02-23 DIAGNOSIS — I480 Paroxysmal atrial fibrillation: Secondary | ICD-10-CM | POA: Diagnosis not present

## 2020-02-23 DIAGNOSIS — E538 Deficiency of other specified B group vitamins: Secondary | ICD-10-CM | POA: Diagnosis not present

## 2020-02-23 DIAGNOSIS — N179 Acute kidney failure, unspecified: Secondary | ICD-10-CM | POA: Diagnosis not present

## 2020-02-23 DIAGNOSIS — F32A Depression, unspecified: Secondary | ICD-10-CM | POA: Diagnosis not present

## 2020-02-23 DIAGNOSIS — I2510 Atherosclerotic heart disease of native coronary artery without angina pectoris: Secondary | ICD-10-CM | POA: Diagnosis not present

## 2020-02-23 DIAGNOSIS — Z7952 Long term (current) use of systemic steroids: Secondary | ICD-10-CM | POA: Diagnosis not present

## 2020-02-23 DIAGNOSIS — I5031 Acute diastolic (congestive) heart failure: Secondary | ICD-10-CM | POA: Diagnosis not present

## 2020-02-23 DIAGNOSIS — R42 Dizziness and giddiness: Secondary | ICD-10-CM | POA: Diagnosis not present

## 2020-02-23 DIAGNOSIS — Z9181 History of falling: Secondary | ICD-10-CM | POA: Diagnosis not present

## 2020-02-23 DIAGNOSIS — J449 Chronic obstructive pulmonary disease, unspecified: Secondary | ICD-10-CM | POA: Diagnosis not present

## 2020-02-23 DIAGNOSIS — Z7982 Long term (current) use of aspirin: Secondary | ICD-10-CM | POA: Diagnosis not present

## 2020-02-23 DIAGNOSIS — I252 Old myocardial infarction: Secondary | ICD-10-CM | POA: Diagnosis not present

## 2020-02-23 DIAGNOSIS — Z87891 Personal history of nicotine dependence: Secondary | ICD-10-CM | POA: Diagnosis not present

## 2020-02-23 DIAGNOSIS — M19041 Primary osteoarthritis, right hand: Secondary | ICD-10-CM | POA: Diagnosis not present

## 2020-02-23 DIAGNOSIS — R29898 Other symptoms and signs involving the musculoskeletal system: Secondary | ICD-10-CM | POA: Diagnosis not present

## 2020-02-23 DIAGNOSIS — E559 Vitamin D deficiency, unspecified: Secondary | ICD-10-CM | POA: Diagnosis not present

## 2020-02-23 DIAGNOSIS — M19042 Primary osteoarthritis, left hand: Secondary | ICD-10-CM | POA: Diagnosis not present

## 2020-02-23 NOTE — Telephone Encounter (Signed)
    Patient calling to report she is itching all over, Seeking advice

## 2020-02-23 NOTE — Telephone Encounter (Signed)
Please see any available provider.  Try Benadryl over-the-counter.  If there are any new medicines that should start to take, they may be the culprit. Thanks

## 2020-02-23 NOTE — Telephone Encounter (Signed)
Liji a physical therapist from [facility] calling requesting verbal orders to decrease frequency for this week and next week for only one time due to the holidays.  [phone] Okay to lvm

## 2020-02-23 NOTE — Telephone Encounter (Signed)
Oxygen tank order has already been faxed to [facility]. Pls advise on phychiatric eval../lmb

## 2020-02-23 NOTE — Telephone Encounter (Signed)
Okay.  Thanks.

## 2020-02-23 NOTE — Telephone Encounter (Signed)
Anissa the home health nurse requesting Dr Alain Marion consider a phychiatric evaluation Also, requesting small portable oxygen tank  Phone [phone]

## 2020-02-23 NOTE — Telephone Encounter (Signed)
Notified Ligi w/ MD response../lmb 

## 2020-02-23 NOTE — Telephone Encounter (Signed)
Notified Jessica w/MD response,.Marland KitchenJohny Chess

## 2020-02-24 ENCOUNTER — Institutional Professional Consult (permissible substitution): Payer: Medicare Other | Admitting: Pulmonary Disease

## 2020-02-24 NOTE — Telephone Encounter (Signed)
Does the patient want to be referred to see [facility] psychiatrist?  Thanks

## 2020-02-25 ENCOUNTER — Telehealth: Payer: Self-pay | Admitting: Cardiology

## 2020-02-25 NOTE — Telephone Encounter (Signed)
Called pt she states she does not need to see psychiatrist../lmb

## 2020-02-25 NOTE — Telephone Encounter (Signed)
Spoke to pt who state she was discharged from the hospital with 3L of O2 but feels like she is still struggling to breath. She state when she walk from her chair to the bathroom, she feels like she can't catch her breath. Pt also report she feels like something is in her throat and she can't catch a deep breath.She denies chest pain but states she does have some swelling in her feet. Pt also report she feels like she is about to experience a nervous break down.   Nurse advised pt to seek care at ER or urgent care due to worsening SOB. Pt verbalized understanding.

## 2020-02-25 NOTE — Telephone Encounter (Signed)
Pt c/o Shortness Of Breath: STAT if SOB developed within the last 24 hours or pt is noticeably SOB on the phone  1. Are you currently SOB (can you hear that pt is SOB on the phone)? Yes  2. How long have you been experiencing SOB? A longtime   3. Are you SOB when sitting or when up moving around? Both   4. Are you currently experiencing any other symptoms? No  [name] is calling stating she feels her oxygen needs to be turned up due to still having SOB with it on.

## 2020-02-26 DIAGNOSIS — M19042 Primary osteoarthritis, left hand: Secondary | ICD-10-CM | POA: Diagnosis not present

## 2020-02-26 DIAGNOSIS — I2510 Atherosclerotic heart disease of native coronary artery without angina pectoris: Secondary | ICD-10-CM | POA: Diagnosis not present

## 2020-02-26 DIAGNOSIS — F32A Depression, unspecified: Secondary | ICD-10-CM | POA: Diagnosis not present

## 2020-02-26 DIAGNOSIS — I252 Old myocardial infarction: Secondary | ICD-10-CM | POA: Diagnosis not present

## 2020-02-26 DIAGNOSIS — B370 Candidal stomatitis: Secondary | ICD-10-CM | POA: Diagnosis not present

## 2020-02-26 DIAGNOSIS — R634 Abnormal weight loss: Secondary | ICD-10-CM | POA: Diagnosis not present

## 2020-02-26 DIAGNOSIS — Z9181 History of falling: Secondary | ICD-10-CM | POA: Diagnosis not present

## 2020-02-26 DIAGNOSIS — R42 Dizziness and giddiness: Secondary | ICD-10-CM | POA: Diagnosis not present

## 2020-02-26 DIAGNOSIS — E538 Deficiency of other specified B group vitamins: Secondary | ICD-10-CM | POA: Diagnosis not present

## 2020-02-26 DIAGNOSIS — E559 Vitamin D deficiency, unspecified: Secondary | ICD-10-CM | POA: Diagnosis not present

## 2020-02-26 DIAGNOSIS — Z7982 Long term (current) use of aspirin: Secondary | ICD-10-CM | POA: Diagnosis not present

## 2020-02-26 DIAGNOSIS — I5031 Acute diastolic (congestive) heart failure: Secondary | ICD-10-CM | POA: Diagnosis not present

## 2020-02-26 DIAGNOSIS — I130 Hypertensive heart and chronic kidney disease with heart failure and stage 1 through stage 4 chronic kidney disease, or unspecified chronic kidney disease: Secondary | ICD-10-CM | POA: Diagnosis not present

## 2020-02-26 DIAGNOSIS — Z7902 Long term (current) use of antithrombotics/antiplatelets: Secondary | ICD-10-CM | POA: Diagnosis not present

## 2020-02-26 DIAGNOSIS — N179 Acute kidney failure, unspecified: Secondary | ICD-10-CM | POA: Diagnosis not present

## 2020-02-26 DIAGNOSIS — J449 Chronic obstructive pulmonary disease, unspecified: Secondary | ICD-10-CM | POA: Diagnosis not present

## 2020-02-26 DIAGNOSIS — Z87891 Personal history of nicotine dependence: Secondary | ICD-10-CM | POA: Diagnosis not present

## 2020-02-26 DIAGNOSIS — M19041 Primary osteoarthritis, right hand: Secondary | ICD-10-CM | POA: Diagnosis not present

## 2020-02-26 DIAGNOSIS — I480 Paroxysmal atrial fibrillation: Secondary | ICD-10-CM | POA: Diagnosis not present

## 2020-02-26 DIAGNOSIS — N183 Chronic kidney disease, stage 3 unspecified: Secondary | ICD-10-CM | POA: Diagnosis not present

## 2020-02-26 DIAGNOSIS — R29898 Other symptoms and signs involving the musculoskeletal system: Secondary | ICD-10-CM | POA: Diagnosis not present

## 2020-02-26 DIAGNOSIS — Z7952 Long term (current) use of systemic steroids: Secondary | ICD-10-CM | POA: Diagnosis not present

## 2020-02-29 DIAGNOSIS — J449 Chronic obstructive pulmonary disease, unspecified: Secondary | ICD-10-CM | POA: Diagnosis not present

## 2020-02-29 DIAGNOSIS — J438 Other emphysema: Secondary | ICD-10-CM | POA: Diagnosis not present

## 2020-03-01 ENCOUNTER — Telehealth: Payer: Self-pay | Admitting: Internal Medicine

## 2020-03-01 ENCOUNTER — Telehealth: Payer: Self-pay | Admitting: Cardiology

## 2020-03-01 DIAGNOSIS — Z9181 History of falling: Secondary | ICD-10-CM | POA: Diagnosis not present

## 2020-03-01 DIAGNOSIS — I2510 Atherosclerotic heart disease of native coronary artery without angina pectoris: Secondary | ICD-10-CM | POA: Diagnosis not present

## 2020-03-01 DIAGNOSIS — E559 Vitamin D deficiency, unspecified: Secondary | ICD-10-CM | POA: Diagnosis not present

## 2020-03-01 DIAGNOSIS — Z7982 Long term (current) use of aspirin: Secondary | ICD-10-CM | POA: Diagnosis not present

## 2020-03-01 DIAGNOSIS — I480 Paroxysmal atrial fibrillation: Secondary | ICD-10-CM | POA: Diagnosis not present

## 2020-03-01 DIAGNOSIS — M19042 Primary osteoarthritis, left hand: Secondary | ICD-10-CM | POA: Diagnosis not present

## 2020-03-01 DIAGNOSIS — Z7952 Long term (current) use of systemic steroids: Secondary | ICD-10-CM | POA: Diagnosis not present

## 2020-03-01 DIAGNOSIS — E538 Deficiency of other specified B group vitamins: Secondary | ICD-10-CM | POA: Diagnosis not present

## 2020-03-01 DIAGNOSIS — R634 Abnormal weight loss: Secondary | ICD-10-CM | POA: Diagnosis not present

## 2020-03-01 DIAGNOSIS — Z7902 Long term (current) use of antithrombotics/antiplatelets: Secondary | ICD-10-CM | POA: Diagnosis not present

## 2020-03-01 DIAGNOSIS — Z87891 Personal history of nicotine dependence: Secondary | ICD-10-CM | POA: Diagnosis not present

## 2020-03-01 DIAGNOSIS — J449 Chronic obstructive pulmonary disease, unspecified: Secondary | ICD-10-CM | POA: Diagnosis not present

## 2020-03-01 DIAGNOSIS — R29898 Other symptoms and signs involving the musculoskeletal system: Secondary | ICD-10-CM | POA: Diagnosis not present

## 2020-03-01 DIAGNOSIS — I252 Old myocardial infarction: Secondary | ICD-10-CM | POA: Diagnosis not present

## 2020-03-01 DIAGNOSIS — F32A Depression, unspecified: Secondary | ICD-10-CM | POA: Diagnosis not present

## 2020-03-01 DIAGNOSIS — R42 Dizziness and giddiness: Secondary | ICD-10-CM | POA: Diagnosis not present

## 2020-03-01 DIAGNOSIS — N179 Acute kidney failure, unspecified: Secondary | ICD-10-CM | POA: Diagnosis not present

## 2020-03-01 DIAGNOSIS — N183 Chronic kidney disease, stage 3 unspecified: Secondary | ICD-10-CM | POA: Diagnosis not present

## 2020-03-01 DIAGNOSIS — B370 Candidal stomatitis: Secondary | ICD-10-CM | POA: Diagnosis not present

## 2020-03-01 DIAGNOSIS — M19041 Primary osteoarthritis, right hand: Secondary | ICD-10-CM | POA: Diagnosis not present

## 2020-03-01 DIAGNOSIS — I130 Hypertensive heart and chronic kidney disease with heart failure and stage 1 through stage 4 chronic kidney disease, or unspecified chronic kidney disease: Secondary | ICD-10-CM | POA: Diagnosis not present

## 2020-03-01 DIAGNOSIS — I5031 Acute diastolic (congestive) heart failure: Secondary | ICD-10-CM | POA: Diagnosis not present

## 2020-03-01 NOTE — Telephone Encounter (Signed)
[facility] Report/Call: Caller reports dry cough x2 weeks, sore throat, Caller short of breath at rest. Caller currently at home by herself.   Advised EMS 911 now.  Caller refused.

## 2020-03-01 NOTE — Telephone Encounter (Signed)
[name] w/ [name] is requesting a social work evaluation. Her number is [phone].

## 2020-03-01 NOTE — Telephone Encounter (Signed)
Called Anessia back from [facility], she has went to check on patient today for home health, and advised that she had listened to her and states that patient is out of rhythm, she has had a 5lb weight gain, and was 94% on room air, but had been siting for a while and still refused to go to the hospital.  Home health nurse just wanted to verify what we had spoken to her about today.

## 2020-03-01 NOTE — Telephone Encounter (Signed)
Pt c/o Shortness Of Breath: STAT if SOB developed within the last 24 hours or pt is noticeably SOB on the phone  1. Are you currently SOB (can you hear that pt is SOB on the phone)? SOB and yes I can hear  2. How long have you been experiencing SOB? For about a month  3. Are you SOB when sitting or when up moving around? Moving around   4. Are you currently experiencing any other symptoms? No   Patient c/o Palpitations:  High priority if patient c/o lightheadedness, shortness of breath, or chest pain  1) How long have you had palpitations/irregular HR/ Afib? Are you having the symptoms now? For a while  2) Are you currently experiencing lightheadedness, SOB or CP? SOB some chest   3) Do you have a history of afib (atrial fibrillation) or irregular heart rhythm? Not sure  Have you checked your BP or HR? (document readings if available): not normal 4) Are you experiencing any other symptoms? That she can not move much

## 2020-03-01 NOTE — Telephone Encounter (Signed)
Patient called and said she has a cough and chest tightness and a sore throat and she said when she coughs her chest hurts and some SOB. Transferred to [facility].

## 2020-03-01 NOTE — Telephone Encounter (Signed)
[facility] nurse called and stated patient is refusing to go to ED. Nurse had already hung up with patient.   Will be sending over triage note.

## 2020-03-01 NOTE — Telephone Encounter (Signed)
Follow up:      Please call  [phone] Anissa from [place] calling concerning this patient.

## 2020-03-01 NOTE — Telephone Encounter (Signed)
Agree with ER eval; if refuses, APP ov Kirk Ruths

## 2020-03-01 NOTE — Telephone Encounter (Signed)
Patient was sent to triage- very short of breath on the phone, she states that anytime she gets up to walk she can feel her heart racing and she is unable to catch her breath. I asked if she had any chest pain, and she states that she just does not feel great. She is unaware if she is having any arrhythmias and has no way to check  what her HR is running. Hearing how short of breath on the phone patient was, I did advise that she call 911 to have assistance. Patient denied stating "she did not want another bill". I strongly encouraged her to call and go to emergency room, but patient refused. I explained to her that at this time with her symptoms she was experiencing emergency was the way to go to get the help she needed. Patient states she will wait for her appointment with Dr.Crenshaw, I did advise I would send him a message to make him aware.

## 2020-03-01 NOTE — Progress Notes (Signed)
HPI: FU CAD and paroxysmal atrial fibrillation. A previous monitor revealed paroxysmal atrial fibrillation/flutter with a rapid ventricular response. Note she refuses anticoagulation. A CardioNet was performed in Feb 2012 secondary to palpitations and revealed sinus rhythm with PACs and rare PVCs.  Patient had acute lateral myocardial infarction June 2021. Cardiac catheterization revealed an occluded first diagonal and 30% mid LAD.  Patient had balloon angioplasty due to small vessel size. Readmitted with hypertensive urgency and congestive heart failure July 2021.  She was diuresed but creatinine increased and Lasix was discontinued.  Her blood pressure medications were adjusted.  Follow-up echocardiogram July 2021 showed ejection fraction 50 to 55%, mild left atrial enlargement and mild aortic insufficiency.  Chest x-ray November 2021 showed interstitial lung markings, small bilateral pleural effusions and COPD/emphysema.  Patient noted to be in recurrent atrial fibrillation December 2021.  She was also not taking her diuretic.  Her diuretic was resumed and Cardizem added.  Since I last saw her,patient states that she has developed progressive dyspnea on exertion.  She becomes dyspneic even walking in the house.  She also describes orthopnea.  She has worsening pedal edema.  She has chest pain with cough but otherwise no symptoms.  She feels her heart pound when she ambulates.  She denies syncope.  Current Outpatient Medications  Medication Sig Dispense Refill  . aspirin 81 MG chewable tablet Chew 1 tablet (81 mg total) by mouth daily.    . Biotin 1000 MCG tablet Take 1,000 mcg by mouth daily.    . Cholecalciferol 25 MCG (1000 UT) capsule Take 1,000 Units by mouth daily.    . clonazePAM (KLONOPIN) 0.5 MG tablet TAKE 1 TABLET BY MOUTH AT BEDTIME AS NEEDED FOR LEG CRAMPS 90 tablet 1  . clopidogrel (PLAVIX) 75 MG tablet Take 1 tablet (75 mg total) by mouth daily with breakfast. 90 tablet 3  .  cyanocobalamin 1000 MCG tablet Take 1,000 mcg by mouth daily.    Marland Kitchen docusate sodium (COLACE) 100 MG capsule Take 1 capsule (100 mg total) by mouth 2 (two) times daily. 10 capsule 0  . Fluticasone-Umeclidin-Vilant (TRELEGY ELLIPTA) 100-62.5-25 MCG/INH AEPB Inhale 1 Inhaler into the lungs daily. 3 each 3  . isosorbide mononitrate (IMDUR) 30 MG 24 hr tablet Take 1 tablet (30 mg total) by mouth daily. 30 tablet 0  . ketoconazole (NIZORAL) 2 % cream APPLY TOPICALLY TO THE AFFECTED AREA DAILY (Patient taking differently: Apply 1 application topically daily as needed for irritation.) 45 g 1  . metoprolol tartrate (LOPRESSOR) 25 MG tablet Take 1 tablet (25 mg total) by mouth 2 (two) times daily. 180 tablet 3  . nitroGLYCERIN (NITROSTAT) 0.4 MG SL tablet Place 1 tablet (0.4 mg total) under the tongue every 5 (five) minutes as needed for chest pain. 25 tablet 0  . nystatin (MYCOSTATIN) 100000 UNIT/ML suspension Take 5 mLs (500,000 Units total) by mouth 4 (four) times daily. Use for 10 days 200 mL 0  . pantoprazole (PROTONIX) 40 MG tablet TAKE 1 TABLET(40 MG) BY MOUTH DAILY 90 tablet 3  . rosuvastatin (CRESTOR) 10 MG tablet Take 1 tablet (10 mg total) by mouth daily. 90 tablet 3  . tobramycin (TOBREX) 0.3 % ophthalmic solution Place 1 drop into both eyes every 6 (six) hours.     . torsemide (DEMADEX) 100 MG tablet Take 1 tablet (100 mg total) by mouth daily. 90 tablet 1   No current facility-administered medications for this visit.     Past Medical  History:  Diagnosis Date  . Anxiety   . Arthritis   . Atrial fibrillation ([place])   . B12 deficiency   . COPD (chronic obstructive pulmonary disease) ([place])   . Depression   . GERD (gastroesophageal reflux disease)   . Hypertension   . Osteoarthrosis, hand    both hands  . Peptic ulcer, unspecified site, unspecified as acute or chronic, without mention of hemorrhage, perforation, or obstruction   . Pneumonia   . PONV (postoperative nausea and vomiting)    . Vitamin D deficiency disease     Past Surgical History:  Procedure Laterality Date  . BLADDER SURGERY     Bladder tack and intestines  . CORONARY/GRAFT ACUTE MI REVASCULARIZATION N/A 08/26/2019   Procedure: Coronary/Graft Acute MI Revascularization;  Surgeon: Sherren Mocha, MD;  Location: [facility];  Service: Cardiovascular;  Laterality: N/A;  . CYSTOCELE REPAIR    . CYSTOSCOPY WITH RETROGRADE PYELOGRAM, URETEROSCOPY AND STENT PLACEMENT Right 09/11/2016   Procedure: CYSTOSCOPY WITH RETROGRADE PYELOGRAM, URETEROSCOPY AND STENT REPLACEMENT;  Surgeon: Cleon Gustin, MD;  Location: [facility];  Service: Urology;  Laterality: Right;  . CYSTOSCOPY WITH STENT PLACEMENT Right 08/11/2016   Procedure: CYSTOSCOPY, URETERSCOPY,  RIGHT RETROGRADE WITH RIGHT STENT PLACEMENT;  Surgeon: Festus Aloe, MD;  Location: [facility];  Service: Urology;  Laterality: Right;  . SPLENECTOMY      Social History   Socioeconomic History  . Marital status: Divorced    Spouse name: Not on file  . Number of children: 2  . Years of education: Not on file  . Highest education level: Not on file  Occupational History  . Occupation: Airline pilot: [employer]  Tobacco Use  . Smoking status: Former Smoker    Packs/day: 1.00    Types: Cigarettes    Quit date: 2008-05-27    Years since quitting: 12.0  . Smokeless tobacco: Never Used  . Tobacco comment: pt unsure how many years she smoked  Vaping Use  . Vaping Use: Never used  Substance and Sexual Activity  . Alcohol use: No  . Drug use: No  . Sexual activity: Not Currently  Other Topics Concern  . Not on file  Social History Narrative   Retired, works part time   Divorced   Former Smoker   1- Son alcoholic abusive   Daughter died in May 28, 2006   Social Determinants of Health   Financial Resource Strain: High Risk  . Difficulty of Paying Living Expenses: Very hard  Food Insecurity: Food Insecurity Present  . Worried About Charity fundraiser in the  Last Year: Often true  . Ran Out of Food in the Last Year: Often true  Transportation Needs: Unmet Transportation Needs  . Lack of Transportation (Medical): Yes  . Lack of Transportation (Non-Medical): Yes  Physical Activity: Unknown  . Days of Exercise per Week: Patient refused  . Minutes of Exercise per Session: Not on file  Stress: No Stress Concern Present  . Feeling of Stress : Not at all  Social Connections: Not on file  Intimate Partner Violence: Not on file    Family History  Problem Relation Age of Onset  . Heart disease Father   . Arthritis Other        Family history of  . Coronary artery disease Other        Family history of  . Hypertension Other        Family history of  . Diabetes Daughter  28 died    ROS: Patient describes weight loss, orthopnea, pedal edema but no fevers or chills, productive cough, hemoptysis, dysphasia, odynophagia, melena, hematochezia, dysuria, hematuria, rash, seizure activity, orthopnea, PND, claudication. Remaining systems are negative.  Physical Exam: Well-developed somewhat frail in no acute distress.  Skin is warm and dry.  HEENT is normal.  Normal eyelids. Neck is supple.  Positive JVD. Chest with diminished breath sounds bases Cardiovascular exam is irregular and tachycardic, no murmur Abdominal exam nontender or distended. No masses palpated. Extremities show 2+ ankle edema. neuro grossly intact  ECG-atrial fibrillation at a rate of 108, nonspecific ST changes, rightward axis.  Personally reviewed  A/P  1 acute on chronic diastolic congestive heart failure-patient is significantly volume overloaded and symptomatic.  This is likely related to atrial fibrillation including uncontrolled rate.  We will admit to telemetry.  We will control heart rate with IV Cardizem.  Hold Lopressor.  I discussed anticoagulation with patient and she is now agreeable.  Begin apixaban 2.5 mg twice daily.  Will schedule TEE guided cardioversion  later in the week as I think reestablishing sinus rhythm would markedly improve her symptoms.  Repeat echocardiogram to reassess LV function.  Hold Demadex.  We will treat with Lasix 40 mg IV twice daily and follow renal function.  2 persistent atrial fibrillation-I think this is contributing to patient's congestive heart failure.  Plan as outlined above.  Begin IV Cardizem for rate control as well as Eliquis.  Proceed with TEE guided cardioversion.  Will likely need to initiate amiodarone once sinus is reestablished.  Flecainide was discontinued previously due to newly diagnosed coronary disease.  3 hypertension-patient's blood pressure is controlled.  Continue present medications and follow.  4 coronary artery disease-continue statin.  Discontinue aspirin and Plavix given need for apixaban.  5 hyperlipidemia-continue statin.  6 history of chronic stage III kidney disease-follow renal function closely with diuresis.  Kirk Ruths, MD

## 2020-03-01 NOTE — Telephone Encounter (Signed)
Sent to Dr. Alain Marion.

## 2020-03-01 NOTE — Telephone Encounter (Signed)
Spoke with patient to advise her of Dr. Jacalyn Lefevre recommendations. Patient states that she is not going to the ER. Offered patient an appointment tomorrow with APP in office, patient refused and only wants to see Dr. Stanford Breed. Patient states that she is going to go to an urgent care walk in clinic today.   Advised patient to call back if any issues or concerns.

## 2020-03-02 ENCOUNTER — Telehealth: Payer: Self-pay | Admitting: Internal Medicine

## 2020-03-02 ENCOUNTER — Telehealth: Payer: Self-pay

## 2020-03-02 DIAGNOSIS — Z7952 Long term (current) use of systemic steroids: Secondary | ICD-10-CM | POA: Diagnosis not present

## 2020-03-02 DIAGNOSIS — Z9181 History of falling: Secondary | ICD-10-CM | POA: Diagnosis not present

## 2020-03-02 DIAGNOSIS — N183 Chronic kidney disease, stage 3 unspecified: Secondary | ICD-10-CM | POA: Diagnosis not present

## 2020-03-02 DIAGNOSIS — E559 Vitamin D deficiency, unspecified: Secondary | ICD-10-CM | POA: Diagnosis not present

## 2020-03-02 DIAGNOSIS — I2510 Atherosclerotic heart disease of native coronary artery without angina pectoris: Secondary | ICD-10-CM | POA: Diagnosis not present

## 2020-03-02 DIAGNOSIS — Z7982 Long term (current) use of aspirin: Secondary | ICD-10-CM | POA: Diagnosis not present

## 2020-03-02 DIAGNOSIS — M19041 Primary osteoarthritis, right hand: Secondary | ICD-10-CM | POA: Diagnosis not present

## 2020-03-02 DIAGNOSIS — E538 Deficiency of other specified B group vitamins: Secondary | ICD-10-CM | POA: Diagnosis not present

## 2020-03-02 DIAGNOSIS — Z7902 Long term (current) use of antithrombotics/antiplatelets: Secondary | ICD-10-CM | POA: Diagnosis not present

## 2020-03-02 DIAGNOSIS — R42 Dizziness and giddiness: Secondary | ICD-10-CM | POA: Diagnosis not present

## 2020-03-02 DIAGNOSIS — I480 Paroxysmal atrial fibrillation: Secondary | ICD-10-CM | POA: Diagnosis not present

## 2020-03-02 DIAGNOSIS — I130 Hypertensive heart and chronic kidney disease with heart failure and stage 1 through stage 4 chronic kidney disease, or unspecified chronic kidney disease: Secondary | ICD-10-CM | POA: Diagnosis not present

## 2020-03-02 DIAGNOSIS — I252 Old myocardial infarction: Secondary | ICD-10-CM | POA: Diagnosis not present

## 2020-03-02 DIAGNOSIS — I5031 Acute diastolic (congestive) heart failure: Secondary | ICD-10-CM | POA: Diagnosis not present

## 2020-03-02 DIAGNOSIS — N179 Acute kidney failure, unspecified: Secondary | ICD-10-CM | POA: Diagnosis not present

## 2020-03-02 DIAGNOSIS — J449 Chronic obstructive pulmonary disease, unspecified: Secondary | ICD-10-CM | POA: Diagnosis not present

## 2020-03-02 DIAGNOSIS — Z87891 Personal history of nicotine dependence: Secondary | ICD-10-CM | POA: Diagnosis not present

## 2020-03-02 DIAGNOSIS — F32A Depression, unspecified: Secondary | ICD-10-CM | POA: Diagnosis not present

## 2020-03-02 DIAGNOSIS — B370 Candidal stomatitis: Secondary | ICD-10-CM | POA: Diagnosis not present

## 2020-03-02 DIAGNOSIS — R29898 Other symptoms and signs involving the musculoskeletal system: Secondary | ICD-10-CM | POA: Diagnosis not present

## 2020-03-02 DIAGNOSIS — M19042 Primary osteoarthritis, left hand: Secondary | ICD-10-CM | POA: Diagnosis not present

## 2020-03-02 DIAGNOSIS — R634 Abnormal weight loss: Secondary | ICD-10-CM | POA: Diagnosis not present

## 2020-03-02 NOTE — Telephone Encounter (Signed)
Returned call to [employer] she states that pt took her BP before she arrived and it was 200's/100's pt took her medication while nurse was there and it did come down to 150/90. Pt continues to be SOB and out of rhythm. O2=98% on O2. Nurse states that pt has been advised many times that she needs to go to the ER to be evaluated and see if they will cardiovert her and pt is refusing. She states that she will "wait for Dr Jacalyn Lefevre appointment to see what he wants to do". Her appt is 03-08-20. Please advise

## 2020-03-02 NOTE — Telephone Encounter (Signed)
[name] called back in today and stated that she is still the same.  Her bp was up this morning.  It was 168/102.  She went out to her home and had her take her meds.  It went down to 150/90 and her HR was 98.  She was advised several times to go to the er.  Pt refuses to go .

## 2020-03-02 NOTE — Telephone Encounter (Signed)
Okay with me. Thanks 

## 2020-03-02 NOTE — Telephone Encounter (Signed)
Virtual office visit with any provider.  ER visit if worse.  Thanks

## 2020-03-02 NOTE — Telephone Encounter (Signed)
Verbals given  

## 2020-03-02 NOTE — Telephone Encounter (Signed)
Anissa from [facility] calling wanting to go over the patients medication list with someone. [phone]

## 2020-03-02 NOTE — Telephone Encounter (Signed)
Returned [name]'s call to go over patient med ist however was placed on hold.

## 2020-03-02 NOTE — Telephone Encounter (Signed)
Spoke with pt and was able to inform her of Dr. Judeen Hammans instructions pt is refusing and states she is not going to wait hours to be seen and she does not have enough O2 for that length of a wait.

## 2020-03-02 NOTE — Telephone Encounter (Signed)
Noted. Thanks.

## 2020-03-03 NOTE — Telephone Encounter (Signed)
    Please call Anissa at [facility] to review med list

## 2020-03-03 NOTE — Telephone Encounter (Signed)
Sent to Dr. Alain Marion.

## 2020-03-03 NOTE — Telephone Encounter (Signed)
Spoke to patient she stated she has been having fast heart beat and sob for the past 2 months.Stated she is having swelling in both feet.Advised she needs to go to ED.Stated she did not want to go to ED she has appointment with Dr.Crenshaw Mon 03/08/20.Appointment offered with extender tomorrow 12/30 but she refused.Stated she will wait and come 1/3.

## 2020-03-03 NOTE — Telephone Encounter (Signed)
    STAT if HR is under 50 or over 120 (normal HR is 60-100 beats per minute)  1) What is your heart rate? 103  2) Do you have a log of your heart rate readings (document readings)?   3) Do you have any other symptoms? Pt said she is having SOB even though she's on oxygen and her HR elevated. She wanted to know what she can do

## 2020-03-04 ENCOUNTER — Ambulatory Visit: Payer: Medicare Other | Admitting: Physician Assistant

## 2020-03-04 NOTE — Telephone Encounter (Signed)
Noted. Thx.

## 2020-03-08 ENCOUNTER — Encounter: Payer: Self-pay | Admitting: Cardiology

## 2020-03-08 ENCOUNTER — Inpatient Hospital Stay (HOSPITAL_COMMUNITY)
Admission: AD | Admit: 2020-03-08 | Discharge: 2020-03-14 | DRG: 291 | Disposition: A | Discharge status: Home / Self Care | Payer: Medicare Other | Source: Ambulatory Visit | Attending: Cardiology | Admitting: Cardiology

## 2020-03-08 ENCOUNTER — Telehealth: Payer: Self-pay

## 2020-03-08 ENCOUNTER — Other Ambulatory Visit: Payer: Self-pay

## 2020-03-08 ENCOUNTER — Ambulatory Visit: Payer: Medicare Other | Admitting: Cardiology

## 2020-03-08 ENCOUNTER — Inpatient Hospital Stay (HOSPITAL_COMMUNITY): Payer: Medicare Other

## 2020-03-08 VITALS — BP 139/74 | HR 107 | Ht 64.0 in | Wt 107.0 lb

## 2020-03-08 DIAGNOSIS — J9811 Atelectasis: Secondary | ICD-10-CM | POA: Diagnosis present

## 2020-03-08 DIAGNOSIS — Z79899 Other long term (current) drug therapy: Secondary | ICD-10-CM

## 2020-03-08 DIAGNOSIS — N1832 Chronic kidney disease, stage 3b: Secondary | ICD-10-CM | POA: Diagnosis not present

## 2020-03-08 DIAGNOSIS — F32A Depression, unspecified: Secondary | ICD-10-CM | POA: Diagnosis present

## 2020-03-08 DIAGNOSIS — E785 Hyperlipidemia, unspecified: Secondary | ICD-10-CM | POA: Diagnosis present

## 2020-03-08 DIAGNOSIS — N183 Chronic kidney disease, stage 3 unspecified: Secondary | ICD-10-CM | POA: Diagnosis present

## 2020-03-08 DIAGNOSIS — Z7982 Long term (current) use of aspirin: Secondary | ICD-10-CM

## 2020-03-08 DIAGNOSIS — I088 Other rheumatic multiple valve diseases: Secondary | ICD-10-CM | POA: Diagnosis not present

## 2020-03-08 DIAGNOSIS — I5041 Acute combined systolic (congestive) and diastolic (congestive) heart failure: Principal | ICD-10-CM | POA: Diagnosis present

## 2020-03-08 DIAGNOSIS — I513 Intracardiac thrombosis, not elsewhere classified: Secondary | ICD-10-CM | POA: Diagnosis not present

## 2020-03-08 DIAGNOSIS — K219 Gastro-esophageal reflux disease without esophagitis: Secondary | ICD-10-CM | POA: Diagnosis not present

## 2020-03-08 DIAGNOSIS — I2510 Atherosclerotic heart disease of native coronary artery without angina pectoris: Secondary | ICD-10-CM | POA: Diagnosis not present

## 2020-03-08 DIAGNOSIS — I5031 Acute diastolic (congestive) heart failure: Secondary | ICD-10-CM

## 2020-03-08 DIAGNOSIS — Z9081 Acquired absence of spleen: Secondary | ICD-10-CM

## 2020-03-08 DIAGNOSIS — I5033 Acute on chronic diastolic (congestive) heart failure: Secondary | ICD-10-CM | POA: Diagnosis present

## 2020-03-08 DIAGNOSIS — Z8249 Family history of ischemic heart disease and other diseases of the circulatory system: Secondary | ICD-10-CM

## 2020-03-08 DIAGNOSIS — Z87891 Personal history of nicotine dependence: Secondary | ICD-10-CM | POA: Diagnosis not present

## 2020-03-08 DIAGNOSIS — Z833 Family history of diabetes mellitus: Secondary | ICD-10-CM | POA: Diagnosis not present

## 2020-03-08 DIAGNOSIS — I4819 Other persistent atrial fibrillation: Secondary | ICD-10-CM | POA: Diagnosis present

## 2020-03-08 DIAGNOSIS — E538 Deficiency of other specified B group vitamins: Secondary | ICD-10-CM | POA: Diagnosis not present

## 2020-03-08 DIAGNOSIS — I5043 Acute on chronic combined systolic (congestive) and diastolic (congestive) heart failure: Secondary | ICD-10-CM | POA: Diagnosis present

## 2020-03-08 DIAGNOSIS — E559 Vitamin D deficiency, unspecified: Secondary | ICD-10-CM | POA: Diagnosis present

## 2020-03-08 DIAGNOSIS — Z7902 Long term (current) use of antithrombotics/antiplatelets: Secondary | ICD-10-CM

## 2020-03-08 DIAGNOSIS — I480 Paroxysmal atrial fibrillation: Secondary | ICD-10-CM | POA: Diagnosis not present

## 2020-03-08 DIAGNOSIS — R059 Cough, unspecified: Secondary | ICD-10-CM | POA: Diagnosis not present

## 2020-03-08 DIAGNOSIS — J439 Emphysema, unspecified: Secondary | ICD-10-CM | POA: Diagnosis present

## 2020-03-08 DIAGNOSIS — M7989 Other specified soft tissue disorders: Secondary | ICD-10-CM | POA: Diagnosis not present

## 2020-03-08 DIAGNOSIS — R079 Chest pain, unspecified: Secondary | ICD-10-CM | POA: Diagnosis not present

## 2020-03-08 DIAGNOSIS — Z9861 Coronary angioplasty status: Secondary | ICD-10-CM

## 2020-03-08 DIAGNOSIS — Z20822 Contact with and (suspected) exposure to covid-19: Secondary | ICD-10-CM | POA: Diagnosis not present

## 2020-03-08 DIAGNOSIS — I130 Hypertensive heart and chronic kidney disease with heart failure and stage 1 through stage 4 chronic kidney disease, or unspecified chronic kidney disease: Principal | ICD-10-CM | POA: Diagnosis present

## 2020-03-08 DIAGNOSIS — I10 Essential (primary) hypertension: Secondary | ICD-10-CM | POA: Diagnosis present

## 2020-03-08 DIAGNOSIS — R0902 Hypoxemia: Secondary | ICD-10-CM | POA: Diagnosis not present

## 2020-03-08 DIAGNOSIS — I252 Old myocardial infarction: Secondary | ICD-10-CM

## 2020-03-08 DIAGNOSIS — I4891 Unspecified atrial fibrillation: Secondary | ICD-10-CM | POA: Diagnosis not present

## 2020-03-08 DIAGNOSIS — I5021 Acute systolic (congestive) heart failure: Secondary | ICD-10-CM | POA: Diagnosis not present

## 2020-03-08 DIAGNOSIS — I371 Nonrheumatic pulmonary valve insufficiency: Secondary | ICD-10-CM | POA: Diagnosis not present

## 2020-03-08 DIAGNOSIS — M79671 Pain in right foot: Secondary | ICD-10-CM

## 2020-03-08 DIAGNOSIS — I5042 Chronic combined systolic (congestive) and diastolic (congestive) heart failure: Secondary | ICD-10-CM | POA: Diagnosis present

## 2020-03-08 DIAGNOSIS — E876 Hypokalemia: Secondary | ICD-10-CM | POA: Diagnosis not present

## 2020-03-08 DIAGNOSIS — I340 Nonrheumatic mitral (valve) insufficiency: Secondary | ICD-10-CM | POA: Diagnosis not present

## 2020-03-08 DIAGNOSIS — J90 Pleural effusion, not elsewhere classified: Secondary | ICD-10-CM | POA: Diagnosis not present

## 2020-03-08 DIAGNOSIS — F419 Anxiety disorder, unspecified: Secondary | ICD-10-CM | POA: Diagnosis present

## 2020-03-08 DIAGNOSIS — M7731 Calcaneal spur, right foot: Secondary | ICD-10-CM | POA: Diagnosis not present

## 2020-03-08 LAB — COMPREHENSIVE METABOLIC PANEL
ALT: 36 U/L (ref 0–44)
AST: 52 U/L — ABNORMAL HIGH (ref 15–41)
Albumin: 3 g/dL — ABNORMAL LOW (ref 3.5–5.0)
Alkaline Phosphatase: 59 U/L (ref 38–126)
Anion gap: 13 (ref 5–15)
BUN: 36 mg/dL — ABNORMAL HIGH (ref 8–23)
CO2: 37 mmol/L — ABNORMAL HIGH (ref 22–32)
Calcium: 9.3 mg/dL (ref 8.9–10.3)
Chloride: 89 mmol/L — ABNORMAL LOW (ref 98–111)
Creatinine, Ser: 1.86 mg/dL — ABNORMAL HIGH (ref 0.44–1.00)
GFR, Estimated: 27 mL/min — ABNORMAL LOW (ref >60)
Glucose, Bld: 75 mg/dL (ref 70–99)
Potassium: 4.7 mmol/L (ref 3.5–5.1)
Sodium: 139 mmol/L (ref 135–145)
Total Bilirubin: 0.9 mg/dL (ref 0.3–1.2)
Total Protein: 6.1 g/dL — ABNORMAL LOW (ref 6.5–8.1)

## 2020-03-08 LAB — CBC WITH DIFFERENTIAL/PLATELET
Abs Immature Granulocytes: 0.05 10*3/uL (ref 0.00–0.07)
Basophils Absolute: 0 10*3/uL (ref 0.0–0.1)
Basophils Relative: 0 %
Eosinophils Absolute: 0.1 10*3/uL (ref 0.0–0.5)
Eosinophils Relative: 1 %
HCT: 41 % (ref 36.0–46.0)
Hemoglobin: 13 g/dL (ref 12.0–15.0)
Immature Granulocytes: 1 %
Lymphocytes Relative: 18 %
Lymphs Abs: 1.9 10*3/uL (ref 0.7–4.0)
MCH: 26.6 pg (ref 26.0–34.0)
MCHC: 31.7 g/dL (ref 30.0–36.0)
MCV: 83.8 fL (ref 80.0–100.0)
Monocytes Absolute: 1.3 10*3/uL — ABNORMAL HIGH (ref 0.1–1.0)
Monocytes Relative: 12 %
Neutro Abs: 7.1 10*3/uL (ref 1.7–7.7)
Neutrophils Relative %: 68 %
Platelets: 412 10*3/uL — ABNORMAL HIGH (ref 150–400)
RBC: 4.89 MIL/uL (ref 3.87–5.11)
RDW: 16 % — ABNORMAL HIGH (ref 11.5–15.5)
WBC: 10.5 10*3/uL (ref 4.0–10.5)
nRBC: 0 % (ref 0.0–0.2)

## 2020-03-08 LAB — RESP PANEL BY RT-PCR (FLU A&B, COVID) ARPGX2
Influenza A by PCR: NEGATIVE
Influenza B by PCR: NEGATIVE
SARS Coronavirus 2 by RT PCR: NEGATIVE

## 2020-03-08 LAB — BRAIN NATRIURETIC PEPTIDE: B Natriuretic Peptide: 3154.1 pg/mL — ABNORMAL HIGH (ref 0.0–100.0)

## 2020-03-08 LAB — T4, FREE: Free T4: 1.34 ng/dL — ABNORMAL HIGH (ref 0.61–1.12)

## 2020-03-08 LAB — MAGNESIUM: Magnesium: 2.2 mg/dL (ref 1.7–2.4)

## 2020-03-08 LAB — TSH: TSH: 7.708 u[IU]/mL — ABNORMAL HIGH (ref 0.350–4.500)

## 2020-03-08 MED ORDER — ROSUVASTATIN CALCIUM 5 MG PO TABS
10.0000 mg | ORAL_TABLET | Freq: Every day | ORAL | Status: DC
  Administered 2020-03-09 – 2020-03-14 (×6): 10 mg via ORAL
  Filled 2020-03-08 (×6): qty 2

## 2020-03-08 MED ORDER — DILTIAZEM LOAD VIA INFUSION
10.0000 mg | Freq: Once | INTRAVENOUS | Status: AC
  Administered 2020-03-08: 10 mg via INTRAVENOUS
  Filled 2020-03-08: qty 10

## 2020-03-08 MED ORDER — ACETAMINOPHEN 325 MG PO TABS
650.0000 mg | ORAL_TABLET | ORAL | Status: DC | PRN
  Administered 2020-03-10: 650 mg via ORAL
  Filled 2020-03-08: qty 2

## 2020-03-08 MED ORDER — CLONAZEPAM 0.5 MG PO TABS
0.5000 mg | ORAL_TABLET | Freq: Every evening | ORAL | Status: DC | PRN
  Administered 2020-03-08 – 2020-03-13 (×4): 0.5 mg via ORAL
  Filled 2020-03-08 (×4): qty 1

## 2020-03-08 MED ORDER — HYDROCERIN EX CREA
TOPICAL_CREAM | Freq: Two times a day (BID) | CUTANEOUS | Status: DC
  Filled 2020-03-08: qty 113

## 2020-03-08 MED ORDER — ONDANSETRON HCL 4 MG/2ML IJ SOLN: 4.0000 mg | Freq: Four times a day (QID) | INTRAMUSCULAR | Status: DC | PRN

## 2020-03-08 MED ORDER — HYDROXYZINE HCL 10 MG PO TABS
10.0000 mg | ORAL_TABLET | Freq: Four times a day (QID) | ORAL | Status: DC | PRN
  Administered 2020-03-09 – 2020-03-13 (×4): 10 mg via ORAL
  Filled 2020-03-08 (×4): qty 1

## 2020-03-08 MED ORDER — PANTOPRAZOLE SODIUM 40 MG PO TBEC
40.0000 mg | DELAYED_RELEASE_TABLET | Freq: Every day | ORAL | Status: DC
  Administered 2020-03-09 – 2020-03-14 (×6): 40 mg via ORAL
  Filled 2020-03-08 (×6): qty 1

## 2020-03-08 MED ORDER — DILTIAZEM HCL-DEXTROSE 125-5 MG/125ML-% IV SOLN (PREMIX)
5.0000 mg/h | INTRAVENOUS | Status: DC
  Administered 2020-03-09: 10 mg/h via INTRAVENOUS
  Filled 2020-03-08 (×2): qty 125

## 2020-03-08 MED ORDER — DIPHENHYDRAMINE HCL 25 MG PO CAPS
25.0000 mg | ORAL_CAPSULE | Freq: Every evening | ORAL | Status: DC | PRN
  Administered 2020-03-08: 25 mg via ORAL
  Filled 2020-03-08: qty 1

## 2020-03-08 MED ORDER — NITROGLYCERIN 0.4 MG SL SUBL: 0.4000 mg | SUBLINGUAL_TABLET | SUBLINGUAL | Status: DC | PRN

## 2020-03-08 MED ORDER — FUROSEMIDE 10 MG/ML IJ SOLN
40.0000 mg | Freq: Two times a day (BID) | INTRAMUSCULAR | Status: DC
  Administered 2020-03-08 – 2020-03-10 (×4): 40 mg via INTRAVENOUS
  Filled 2020-03-08 (×4): qty 4

## 2020-03-08 MED ORDER — APIXABAN 2.5 MG PO TABS
2.5000 mg | ORAL_TABLET | Freq: Two times a day (BID) | ORAL | Status: DC
  Administered 2020-03-08 – 2020-03-14 (×12): 2.5 mg via ORAL
  Filled 2020-03-08 (×12): qty 1

## 2020-03-08 MED ORDER — DOCUSATE SODIUM 100 MG PO CAPS
100.0000 mg | ORAL_CAPSULE | Freq: Two times a day (BID) | ORAL | Status: DC
  Administered 2020-03-09 – 2020-03-14 (×10): 100 mg via ORAL
  Filled 2020-03-08 (×11): qty 1

## 2020-03-08 MED ORDER — TOBRAMYCIN 0.3 % OP SOLN
1.0000 [drp] | Freq: Four times a day (QID) | OPHTHALMIC | Status: DC
  Administered 2020-03-09: 1 [drp] via OPHTHALMIC
  Filled 2020-03-08: qty 5

## 2020-03-08 NOTE — H&P (Addendum)
Patient seen in the office today by Dr. Stanford Breed and reported progressive dyspnea on exertion as well as orthopnea and worsening pedal edema. She also reported chest pain when coughing. She was found to be in atrial fibrillation with RVR and was noted to be volume overloaded on exam. Therefore, she was admitted from the office with plans for further management of acute on chronic diastolic CHF and persistent atrial fibrillation with RVR. Please see Dr. Jacalyn Lefevre office visit note below which will serve as H&P. I did go to check on patient once she arrived to the floor. She reported feeling short of breath but is not in any distress. O2 sats in the mid 90's. She also reports she has been itching at night recently and request something for this.   Please see Dr. Jacalyn Lefevre note for full detailed plan but here is a summary: - Will check routine labs (CBC, CMET, Mg, TSH, BNP). Will also check for COVID. - Will order chest x-ray.  - Will order updated Echo to reassess LV function. - Will hold home Lopressor and start IV Cardizem.  - Will stop home Aspirin and Plavix and start Eliquis 2.5mg  twice daily. - Will hold home Torsemide and start IV Lasix 40mg  twice daily. - Will plan for TEE/DCCV later this week (Thursday). May need to initiate Amiodarone once sinus rhythm is re-established. - Patient dropped a pepper shaker on her right foot yesterday and she has a large bruise and tenderness to this area. She states she could not put her shoe on today due to the pain. Will order foot x-ray to make sure there is not a fracture.  - Will order PRN Benadryl for itching at night.  Darreld Mclean, PA-C 03/08/2020 6:18 PM Pager: [phone]       HPI: FU CAD and paroxysmal atrial fibrillation. A previous monitor revealed paroxysmal atrial fibrillation/flutter with a rapid ventricular response. Note she refuses anticoagulation. A CardioNet was performed in Feb 2012 secondary to palpitations and revealed  sinus rhythm with PACs and rare PVCs.Patient had acute lateral myocardial infarction June 2021. Cardiac catheterization revealed an occluded first diagonal and 30% mid LAD. Patient had balloon angioplasty due to small vessel size. Readmitted with hypertensive urgency and congestive heart failure July 2021. She was diuresed but creatinine increased and Lasix was discontinued. Her blood pressure medications were adjusted. Follow-up echocardiogram July 2021 showed ejection fraction 50 to 55%, mild left atrial enlargement and mild aortic insufficiency.  Chest x-ray November 2021 showed interstitial lung markings, small bilateral pleural effusions and COPD/emphysema.  Patient noted to be in recurrent atrial fibrillation December 2021.  She was also not taking her diuretic.  Her diuretic was resumed and Cardizem added.  Since I last saw her,patient states that she has developed progressive dyspnea on exertion.  She becomes dyspneic even walking in the house.  She also describes orthopnea.  She has worsening pedal edema.  She has chest pain with cough but otherwise no symptoms.  She feels her heart pound when she ambulates.  She denies syncope.        Current Outpatient Medications  Medication Sig Dispense Refill  . aspirin 81 MG chewable tablet Chew 1 tablet (81 mg total) by mouth daily.    . Biotin 1000 MCG tablet Take 1,000 mcg by mouth daily.    . Cholecalciferol 25 MCG (1000 UT) capsule Take 1,000 Units by mouth daily.    . clonazePAM (KLONOPIN) 0.5 MG tablet TAKE 1 TABLET BY MOUTH AT BEDTIME AS NEEDED  FOR LEG CRAMPS 90 tablet 1  . clopidogrel (PLAVIX) 75 MG tablet Take 1 tablet (75 mg total) by mouth daily with breakfast. 90 tablet 3  . cyanocobalamin 1000 MCG tablet Take 1,000 mcg by mouth daily.    Marland Kitchen docusate sodium (COLACE) 100 MG capsule Take 1 capsule (100 mg total) by mouth 2 (two) times daily. 10 capsule 0  . Fluticasone-Umeclidin-Vilant (TRELEGY ELLIPTA) 100-62.5-25 MCG/INH AEPB  Inhale 1 Inhaler into the lungs daily. 3 each 3  . isosorbide mononitrate (IMDUR) 30 MG 24 hr tablet Take 1 tablet (30 mg total) by mouth daily. 30 tablet 0  . ketoconazole (NIZORAL) 2 % cream APPLY TOPICALLY TO THE AFFECTED AREA DAILY (Patient taking differently: Apply 1 application topically daily as needed for irritation.) 45 g 1  . metoprolol tartrate (LOPRESSOR) 25 MG tablet Take 1 tablet (25 mg total) by mouth 2 (two) times daily. 180 tablet 3  . nitroGLYCERIN (NITROSTAT) 0.4 MG SL tablet Place 1 tablet (0.4 mg total) under the tongue every 5 (five) minutes as needed for chest pain. 25 tablet 0  . nystatin (MYCOSTATIN) 100000 UNIT/ML suspension Take 5 mLs (500,000 Units total) by mouth 4 (four) times daily. Use for 10 days 200 mL 0  . pantoprazole (PROTONIX) 40 MG tablet TAKE 1 TABLET(40 MG) BY MOUTH DAILY 90 tablet 3  . rosuvastatin (CRESTOR) 10 MG tablet Take 1 tablet (10 mg total) by mouth daily. 90 tablet 3  . tobramycin (TOBREX) 0.3 % ophthalmic solution Place 1 drop into both eyes every 6 (six) hours.     . torsemide (DEMADEX) 100 MG tablet Take 1 tablet (100 mg total) by mouth daily. 90 tablet 1   No current facility-administered medications for this visit.         Past Medical History:  Diagnosis Date  . Anxiety   . Arthritis   . Atrial fibrillation ([place])   . B12 deficiency   . COPD (chronic obstructive pulmonary disease) ([place])   . Depression   . GERD (gastroesophageal reflux disease)   . Hypertension   . Osteoarthrosis, hand    both hands  . Peptic ulcer, unspecified site, unspecified as acute or chronic, without mention of hemorrhage, perforation, or obstruction   . Pneumonia   . PONV (postoperative nausea and vomiting)   . Vitamin D deficiency disease          Past Surgical History:  Procedure Laterality Date  . BLADDER SURGERY     Bladder tack and intestines  . CORONARY/GRAFT ACUTE MI REVASCULARIZATION N/A 08/26/2019   Procedure:  Coronary/Graft Acute MI Revascularization;  Surgeon: Sherren Mocha, MD;  Location: [facility];  Service: Cardiovascular;  Laterality: N/A;  . CYSTOCELE REPAIR    . CYSTOSCOPY WITH RETROGRADE PYELOGRAM, URETEROSCOPY AND STENT PLACEMENT Right 09/11/2016   Procedure: CYSTOSCOPY WITH RETROGRADE PYELOGRAM, URETEROSCOPY AND STENT REPLACEMENT;  Surgeon: Cleon Gustin, MD;  Location: [facility];  Service: Urology;  Laterality: Right;  . CYSTOSCOPY WITH STENT PLACEMENT Right 08/11/2016   Procedure: CYSTOSCOPY, URETERSCOPY,  RIGHT RETROGRADE WITH RIGHT STENT PLACEMENT;  Surgeon: Festus Aloe, MD;  Location: [facility];  Service: Urology;  Laterality: Right;  . SPLENECTOMY      Social History        Socioeconomic History  . Marital status: Divorced    Spouse name: Not on file  . Number of children: 2  . Years of education: Not on file  . Highest education level: Not on file  Occupational History  .  Occupation: Airline pilot: [employer]  Tobacco Use  . Smoking status: Former Smoker    Packs/day: 1.00    Types: Cigarettes    Quit date: 06/08/2008    Years since quitting: 12.0  . Smokeless tobacco: Never Used  . Tobacco comment: pt unsure how many years she smoked  Vaping Use  . Vaping Use: Never used  Substance and Sexual Activity  . Alcohol use: No  . Drug use: No  . Sexual activity: Not Currently  Other Topics Concern  . Not on file  Social History Narrative   Retired, works part time   Divorced   Former Smoker   1- Son alcoholic abusive   Daughter died in 2006/06/09   Social Determinants of Health      Financial Resource Strain: High Risk  . Difficulty of Paying Living Expenses: Very hard  Food Insecurity: Food Insecurity Present  . Worried About Charity fundraiser in the Last Year: Often true  . Ran Out of Food in the Last Year: Often true  Transportation Needs: Unmet Transportation Needs  . Lack of Transportation (Medical): Yes  . Lack of  Transportation (Non-Medical): Yes  Physical Activity: Unknown  . Days of Exercise per Week: Patient refused  . Minutes of Exercise per Session: Not on file  Stress: No Stress Concern Present  . Feeling of Stress : Not at all  Social Connections: Not on file  Intimate Partner Violence: Not on file         Family History  Problem Relation Age of Onset  . Heart disease Father   . Arthritis Other        Family history of  . Coronary artery disease Other        Family history of  . Hypertension Other        Family history of  . Diabetes Daughter        63 died    ROS: Patient describes weight loss, orthopnea, pedal edema but no fevers or chills, productive cough, hemoptysis, dysphasia, odynophagia, melena, hematochezia, dysuria, hematuria, rash, seizure activity, orthopnea, PND, claudication. Remaining systems are negative.  Physical Exam: Well-developed somewhat frail in no acute distress.  Skin is warm and dry.  HEENT is normal.  Normal eyelids. Neck is supple.  Positive JVD. Chest with diminished breath sounds bases Cardiovascular exam is irregular and tachycardic, no murmur Abdominal exam nontender or distended. No masses palpated. Extremities show 2+ ankle edema. neuro grossly intact  ECG-atrial fibrillation at a rate of 108, nonspecific ST changes, rightward axis.  Personally reviewed  A/P  1 acute on chronic diastolic congestive heart failure-patient is significantly volume overloaded and symptomatic.  This is likely related to atrial fibrillation including uncontrolled rate.  We will admit to telemetry.  We will control heart rate with IV Cardizem.  Hold Lopressor.  I discussed anticoagulation with patient and she is now agreeable.  Begin apixaban 2.5 mg twice daily.  Will schedule TEE guided cardioversion later in the week as I think reestablishing sinus rhythm would markedly improve her symptoms.  Repeat echocardiogram to reassess LV function.  Hold  Demadex.  We will treat with Lasix 40 mg IV twice daily and follow renal function.  2 persistent atrial fibrillation-I think this is contributing to patient's congestive heart failure.  Plan as outlined above.  Begin IV Cardizem for rate control as well as Eliquis.  Proceed with TEE guided cardioversion.  Will likely need to initiate amiodarone once sinus is reestablished.  Flecainide was discontinued previously due to newly diagnosed coronary disease.  3 hypertension-patient's blood pressure is controlled.  Continue present medications and follow.  4 coronary artery disease-continue statin.  Discontinue aspirin and Plavix given need for apixaban.  5 hyperlipidemia-continue statin.  6 history of chronic stage III kidney disease-follow renal function closely with diuresis.  Kirk Ruths, MD

## 2020-03-08 NOTE — Telephone Encounter (Signed)
° ° °  Patient calling to report itching all over. Please call

## 2020-03-08 NOTE — Telephone Encounter (Signed)
Patient wasn't sure if someone called her today regarding whether a room was ready for her at the hospital. Was in chart to check

## 2020-03-08 NOTE — Progress Notes (Addendum)
   Reviewed admission labs/imaging.  BNP elevated in the 3,000s. Chest x-ray showed moderate bilateral pleural effusion. No overt edema. Creatinine 1.86. Baseline seems to be in 1.4 to 1.8 range. Will need to monitor closely with diuresis. Repeat BMET ordered for the morning.  Potassium 4.7 and Magnesium 2.2.   TSH elevated at 7.7 (was 3.8 in 09/2019). Will check free T4 and free T3.  Darreld Mclean, PA-C 03/08/2020 9:11 PM

## 2020-03-09 ENCOUNTER — Inpatient Hospital Stay (HOSPITAL_COMMUNITY): Payer: Medicare Other

## 2020-03-09 DIAGNOSIS — I4891 Unspecified atrial fibrillation: Secondary | ICD-10-CM | POA: Diagnosis not present

## 2020-03-09 DIAGNOSIS — I5033 Acute on chronic diastolic (congestive) heart failure: Secondary | ICD-10-CM

## 2020-03-09 LAB — BASIC METABOLIC PANEL
Anion gap: 16 — ABNORMAL HIGH (ref 5–15)
BUN: 35 mg/dL — ABNORMAL HIGH (ref 8–23)
CO2: 33 mmol/L — ABNORMAL HIGH (ref 22–32)
Calcium: 8.7 mg/dL — ABNORMAL LOW (ref 8.9–10.3)
Chloride: 88 mmol/L — ABNORMAL LOW (ref 98–111)
Creatinine, Ser: 1.71 mg/dL — ABNORMAL HIGH (ref 0.44–1.00)
GFR, Estimated: 30 mL/min — ABNORMAL LOW (ref >60)
Glucose, Bld: 112 mg/dL — ABNORMAL HIGH (ref 70–99)
Potassium: 2.9 mmol/L — ABNORMAL LOW (ref 3.5–5.1)
Sodium: 137 mmol/L (ref 135–145)

## 2020-03-09 LAB — ECHOCARDIOGRAM COMPLETE
Area-P 1/2: 6.54 cm2
Height: 64 in
P 1/2 time: 296 msec
S' Lateral: 3 cm
Weight: 1696.66 oz

## 2020-03-09 LAB — CBC
HCT: 37.7 % (ref 36.0–46.0)
Hemoglobin: 12 g/dL (ref 12.0–15.0)
MCH: 26.5 pg (ref 26.0–34.0)
MCHC: 31.8 g/dL (ref 30.0–36.0)
MCV: 83.4 fL (ref 80.0–100.0)
Platelets: 377 10*3/uL (ref 150–400)
RBC: 4.52 MIL/uL (ref 3.87–5.11)
RDW: 16.2 % — ABNORMAL HIGH (ref 11.5–15.5)
WBC: 10.7 10*3/uL — ABNORMAL HIGH (ref 4.0–10.5)
nRBC: 0 % (ref 0.0–0.2)

## 2020-03-09 MED ORDER — SODIUM CHLORIDE 0.9 % IV SOLN: INTRAVENOUS | Status: DC

## 2020-03-09 MED ORDER — POTASSIUM CHLORIDE CRYS ER 10 MEQ PO TBCR
10.0000 meq | EXTENDED_RELEASE_TABLET | Freq: Once | ORAL | Status: AC
  Administered 2020-03-09: 10 meq via ORAL
  Filled 2020-03-09: qty 1

## 2020-03-09 MED ORDER — MENTHOL 3 MG MT LOZG
1.0000 | LOZENGE | OROMUCOSAL | Status: DC | PRN
  Administered 2020-03-10: 3 mg via ORAL
  Filled 2020-03-09 (×2): qty 9

## 2020-03-09 MED ORDER — HYDROCORTISONE 1 % EX CREA
1.0000 "application " | TOPICAL_CREAM | Freq: Three times a day (TID) | CUTANEOUS | Status: DC | PRN
  Administered 2020-03-12 – 2020-03-13 (×2): 1 via TOPICAL
  Filled 2020-03-09: qty 28

## 2020-03-09 MED ORDER — ISOSORBIDE MONONITRATE ER 30 MG PO TB24
30.0000 mg | ORAL_TABLET | Freq: Every day | ORAL | Status: DC
  Administered 2020-03-09 – 2020-03-14 (×6): 30 mg via ORAL
  Filled 2020-03-09 (×6): qty 1

## 2020-03-09 MED ORDER — METOPROLOL TARTRATE 25 MG PO TABS
25.0000 mg | ORAL_TABLET | Freq: Two times a day (BID) | ORAL | Status: DC
  Administered 2020-03-09 – 2020-03-10 (×3): 25 mg via ORAL
  Filled 2020-03-09 (×3): qty 1

## 2020-03-09 NOTE — Plan of Care (Signed)
The patient is currently sleeping in bed in no acute distress. C/o pain in her mouth. Throat lozenges ordered PRN and administered x1 with effective results. She ambulates with supervision of 1 person and is continent of bowel and bladder. VS remain WNL. She remains in a fib with a controlled heart rate. Pt to go for TEE and cardioversion in the morning.Fuller Canada, RN

## 2020-03-09 NOTE — Progress Notes (Signed)
Progress Note  Patient Name: [name] Date of Encounter: 03/09/2020  [facility] Cardiologist: Kirk Ruths, MD   Subjective   "My mouth is dry.  I have not been able to smell for 3 wks"   Denise CP  Breathing is not good   Inpatient Medications    Scheduled Meds: . apixaban  2.5 mg Oral BID  . docusate sodium  100 mg Oral BID  . furosemide  40 mg Intravenous BID  . hydrocerin   Topical BID  . pantoprazole  40 mg Oral Daily  . rosuvastatin  10 mg Oral Daily  . tobramycin  1 drop Both Eyes Q6H   Continuous Infusions: . diltiazem (CARDIZEM) infusion 10 mg/hr (03/08/20 2322)   PRN Meds: acetaminophen, clonazePAM, hydrocortisone cream, hydrOXYzine, nitroGLYCERIN, ondansetron (ZOFRAN) IV   Vital Signs    Vitals:   03/08/20 2300 03/09/20 0500 03/09/20 0530 03/09/20 0735  BP: (!) 153/90  (!) 153/69   Pulse: 93     Resp: 18  17   Temp: (!) 97 F (36.1 C)  (!) 97.5 F (36.4 C) 97.6 F (36.4 C)  TempSrc:   Oral Oral  SpO2: 94%  (!) 88%   Weight:  48.1 kg    Height:        Intake/Output Summary (Last 24 hours) at 03/09/2020 0917 Last data filed at 03/09/2020 0853 Gross per 24 hour  Intake 528.33 ml  Output 1450 ml  Net -921.67 ml   Last 3 Weights 03/09/2020 03/08/2020 03/08/2020  Weight (lbs) 106 lb 0.7 oz 107 lb 107 lb  Weight (kg) 48.1 kg 48.535 kg 48.535 kg      Telemetry    Afib 80s - Personally Reviewed  ECG    No new  - Personally Reviewed  Physical Exam   GEN: Thin 85 yo in NAD  Neck: No JVD Cardiac: Irreg irreg   NO S3   Respiratory: Rhonchi   GI: Soft, nontender, non-distended  MS: 1+ LE  edema; No deformity. Neuro:  Nonfocal  Psych: Normal affect   Labs    High Sensitivity Troponin:  No results for input(s): TROPONINIHS in the last 720 hours.    Chemistry Recent Labs  Lab 03/08/20 1900 03/09/20 0524  NA 139 137  K 4.7 2.9*  CL 89* 88*  CO2 37* 33*  GLUCOSE 75 112*  BUN 36* 35*  CREATININE 1.86* 1.71*  CALCIUM 9.3 8.7*   PROT 6.1*  --   ALBUMIN 3.0*  --   AST 52*  --   ALT 36  --   ALKPHOS 59  --   BILITOT 0.9  --   GFRNONAA 27* 30*  ANIONGAP 13 16*     Hematology Recent Labs  Lab 03/08/20 1900 03/09/20 0524  WBC 10.5 10.7*  RBC 4.89 4.52  HGB 13.0 12.0  HCT 41.0 37.7  MCV 83.8 83.4  MCH 26.6 26.5  MCHC 31.7 31.8  RDW 16.0* 16.2*  PLT 412* 377    BNP Recent Labs  Lab 03/08/20 1900  BNP 3,154.1*     DDimer No results for input(s): DDIMER in the last 168 hours.   Radiology    DG CHEST PORT 1 VIEW  Result Date: 03/08/2020 CLINICAL DATA:  Chest pain and cough today. EXAM: PORTABLE CHEST 1 VIEW COMPARISON:  02/03/2020 FINDINGS: Cardiac silhouette is mildly enlarged. Aorta is densely calcified. No mediastinal or hilar masses. There is opacity at both lung bases consistent with pleural effusions, which obscure the hemidiaphragms.  Mild associated dependent lung base atelectasis. Remainder of the lungs is essentially clear with no convincing pneumonia or pulmonary edema. No pneumothorax. Skeletal structures are grossly intact. IMPRESSION: 1. Moderate bilateral pleural effusions with associated lung base opacity, the latter consistent with atelectasis. No convincing pneumonia or pulmonary edema and no change from the prior study. Electronically Signed   By: Lajean Manes M.D.   On: 03/08/2020 19:26   DG Foot 2 Views Right  Result Date: 03/08/2020 CLINICAL DATA:  Right foot pain and swelling. Dropped a heavy object on the right foot yesterday. EXAM: RIGHT FOOT - 2 VIEW COMPARISON:  None. FINDINGS: No fracture or bone lesion. Joints are normally aligned. Skeletal structures are demineralized. There are small dorsal and plantar calcaneal spurs. There is dorsal forefoot soft tissue swelling. IMPRESSION: No fracture or dislocation Electronically Signed   By: Lajean Manes M.D.   On: 03/08/2020 19:24    Cardiac Studies   Echo pending   Patient Profile     85 y.o. female with Hx of CAD, PAF  admitted from clinic yesterday for Rx of CHF    Assessment & Plan    1  Acute on chronic diastolic CHF  Pt seen yesterday  In cinic   Still with dyspnea.  BNP 3154   Felt to probably be due to afib with RVR Admitted for diuresis and Rx of afib Has put out about 1 L so far Echo today     2  Atrial fibrillation   Felt contributing to problem 1  She is now on IV diltiazem   Rats are OK but would be better if in SR   Will tentatively place on TEE cardioversion board for tomorrow.  Echo today   3  HTN   BP is high   Will add back Imdur and metoprolol     4 CAD   No symptoms of angina    5  HL  On statin   6   CKD  Cr 1.71  Down from 1.86 yesterday     For questions or updates, please contact [place] Please consult [email or web address] for contact info under        Signed, Dorris Carnes, MD  03/09/2020, 9:17 AM

## 2020-03-09 NOTE — Progress Notes (Signed)
  Echocardiogram 2D Echocardiogram has been performed.  [name] 03/09/2020, 11:40 AM

## 2020-03-09 NOTE — H&P (Signed)
Stephanie Perla, MD  Physician  Cardiology  Progress Notes     Signed  Encounter Date:  03/08/2020      Related encounter: Office Visit from 03/08/2020 in [place]         Show:Clear all [x] Manual[x] Template[x] Copied  Added by: [x] Stephanie Perla, MD   [] Hover for details       HPI: FU CAD and paroxysmal atrial fibrillation. A previous monitor revealed paroxysmal atrial fibrillation/flutter with a rapid ventricular response. Note she refuses anticoagulation. A CardioNet was performed in Feb 2012 secondary to palpitations and revealed sinus rhythm with PACs and rare PVCs.Patient had acute lateral myocardial infarction June 2021. Cardiac catheterization revealed an occluded first diagonal and 30% mid LAD. Patient had balloon angioplasty due to small vessel size. Readmitted with hypertensive urgency and congestive heart failure July 2021. She was diuresed but creatinine increased and Lasix was discontinued. Her blood pressure medications were adjusted. Follow-up echocardiogram July 2021 showed ejection fraction 50 to 55%, mild left atrial enlargement and mild aortic insufficiency.  Chest x-ray November 2021 showed interstitial lung markings, small bilateral pleural effusions and COPD/emphysema.  Patient noted to be in recurrent atrial fibrillation December 2021.  She was also not taking her diuretic.  Her diuretic was resumed and Cardizem added.  Since I last saw her,patient states that she has developed progressive dyspnea on exertion.  She becomes dyspneic even walking in the house.  She also describes orthopnea.  She has worsening pedal edema.  She has chest pain with cough but otherwise no symptoms.  She feels her heart pound when she ambulates.  She denies syncope.        Current Outpatient Medications  Medication Sig Dispense Refill  . aspirin 81 MG chewable tablet Chew 1 tablet (81 mg total) by mouth daily.    . Biotin 1000 MCG  tablet Take 1,000 mcg by mouth daily.    . Cholecalciferol 25 MCG (1000 UT) capsule Take 1,000 Units by mouth daily.    . clonazePAM (KLONOPIN) 0.5 MG tablet TAKE 1 TABLET BY MOUTH AT BEDTIME AS NEEDED FOR LEG CRAMPS 90 tablet 1  . clopidogrel (PLAVIX) 75 MG tablet Take 1 tablet (75 mg total) by mouth daily with breakfast. 90 tablet 3  . cyanocobalamin 1000 MCG tablet Take 1,000 mcg by mouth daily.    Marland Kitchen docusate sodium (COLACE) 100 MG capsule Take 1 capsule (100 mg total) by mouth 2 (two) times daily. 10 capsule 0  . Fluticasone-Umeclidin-Vilant (TRELEGY ELLIPTA) 100-62.5-25 MCG/INH AEPB Inhale 1 Inhaler into the lungs daily. 3 each 3  . isosorbide mononitrate (IMDUR) 30 MG 24 hr tablet Take 1 tablet (30 mg total) by mouth daily. 30 tablet 0  . ketoconazole (NIZORAL) 2 % cream APPLY TOPICALLY TO THE AFFECTED AREA DAILY (Patient taking differently: Apply 1 application topically daily as needed for irritation.) 45 g 1  . metoprolol tartrate (LOPRESSOR) 25 MG tablet Take 1 tablet (25 mg total) by mouth 2 (two) times daily. 180 tablet 3  . nitroGLYCERIN (NITROSTAT) 0.4 MG SL tablet Place 1 tablet (0.4 mg total) under the tongue every 5 (five) minutes as needed for chest pain. 25 tablet 0  . nystatin (MYCOSTATIN) 100000 UNIT/ML suspension Take 5 mLs (500,000 Units total) by mouth 4 (four) times daily. Use for 10 days 200 mL 0  . pantoprazole (PROTONIX) 40 MG tablet TAKE 1 TABLET(40 MG) BY MOUTH DAILY 90 tablet 3  . rosuvastatin (CRESTOR) 10 MG  tablet Take 1 tablet (10 mg total) by mouth daily. 90 tablet 3  . tobramycin (TOBREX) 0.3 % ophthalmic solution Place 1 drop into both eyes every 6 (six) hours.     . torsemide (DEMADEX) 100 MG tablet Take 1 tablet (100 mg total) by mouth daily. 90 tablet 1   No current facility-administered medications for this visit.         Past Medical History:  Diagnosis Date  . Anxiety   . Arthritis   . Atrial fibrillation ([place])   . B12 deficiency    . COPD (chronic obstructive pulmonary disease) ([place])   . Depression   . GERD (gastroesophageal reflux disease)   . Hypertension   . Osteoarthrosis, hand    both hands  . Peptic ulcer, unspecified site, unspecified as acute or chronic, without mention of hemorrhage, perforation, or obstruction   . Pneumonia   . PONV (postoperative nausea and vomiting)   . Vitamin D deficiency disease          Past Surgical History:  Procedure Laterality Date  . BLADDER SURGERY     Bladder tack and intestines  . CORONARY/GRAFT ACUTE MI REVASCULARIZATION N/A 08/26/2019   Procedure: Coronary/Graft Acute MI Revascularization;  Surgeon: Sherren Mocha, MD;  Location: [facility];  Service: Cardiovascular;  Laterality: N/A;  . CYSTOCELE REPAIR    . CYSTOSCOPY WITH RETROGRADE PYELOGRAM, URETEROSCOPY AND STENT PLACEMENT Right 09/11/2016   Procedure: CYSTOSCOPY WITH RETROGRADE PYELOGRAM, URETEROSCOPY AND STENT REPLACEMENT;  Surgeon: Cleon Gustin, MD;  Location: [facility];  Service: Urology;  Laterality: Right;  . CYSTOSCOPY WITH STENT PLACEMENT Right 08/11/2016   Procedure: CYSTOSCOPY, URETERSCOPY,  RIGHT RETROGRADE WITH RIGHT STENT PLACEMENT;  Surgeon: Festus Aloe, MD;  Location: [facility];  Service: Urology;  Laterality: Right;  . SPLENECTOMY      Social History        Socioeconomic History  . Marital status: Divorced    Spouse name: Not on file  . Number of children: 2  . Years of education: Not on file  . Highest education level: Not on file  Occupational History  . Occupation: Airline pilot: [employer]  Tobacco Use  . Smoking status: Former Smoker    Packs/day: 1.00    Types: Cigarettes    Quit date: Jun 14, 2008    Years since quitting: 12.0  . Smokeless tobacco: Never Used  . Tobacco comment: pt unsure how many years she smoked  Vaping Use  . Vaping Use: Never used  Substance and Sexual Activity  . Alcohol use: No  . Drug use: No  . Sexual  activity: Not Currently  Other Topics Concern  . Not on file  Social History Narrative   Retired, works part time   Divorced   Former Smoker   1- Son alcoholic abusive   Daughter died in Jun 15, 2006   Social Determinants of Health      Financial Resource Strain: High Risk  . Difficulty of Paying Living Expenses: Very hard  Food Insecurity: Food Insecurity Present  . Worried About Charity fundraiser in the Last Year: Often true  . Ran Out of Food in the Last Year: Often true  Transportation Needs: Unmet Transportation Needs  . Lack of Transportation (Medical): Yes  . Lack of Transportation (Non-Medical): Yes  Physical Activity: Unknown  . Days of Exercise per Week: Patient refused  . Minutes of Exercise per Session: Not on file  Stress: No Stress Concern Present  . Feeling of  Stress : Not at all  Social Connections: Not on file  Intimate Partner Violence: Not on file         Family History  Problem Relation Age of Onset  . Heart disease Father   . Arthritis Other        Family history of  . Coronary artery disease Other        Family history of  . Hypertension Other        Family history of  . Diabetes Daughter        7 died    ROS: Patient describes weight loss, orthopnea, pedal edema but no fevers or chills, productive cough, hemoptysis, dysphasia, odynophagia, melena, hematochezia, dysuria, hematuria, rash, seizure activity, orthopnea, PND, claudication. Remaining systems are negative.  Physical Exam: Well-developed somewhat frail in no acute distress.  Skin is warm and dry.  HEENT is normal.  Normal eyelids. Neck is supple.  Positive JVD. Chest with diminished breath sounds bases Cardiovascular exam is irregular and tachycardic, no murmur Abdominal exam nontender or distended. No masses palpated. Extremities show 2+ ankle edema. neuro grossly intact  ECG-atrial fibrillation at a rate of 108, nonspecific ST changes, rightward axis.   Personally reviewed  A/P  1 acute on chronic diastolic congestive heart failure-patient is significantly volume overloaded and symptomatic.  This is likely related to atrial fibrillation including uncontrolled rate.  We will admit to telemetry.  We will control heart rate with IV Cardizem.  Hold Lopressor.  I discussed anticoagulation with patient and she is now agreeable.  Begin apixaban 2.5 mg twice daily.  Will schedule TEE guided cardioversion later in the week as I think reestablishing sinus rhythm would markedly improve her symptoms.  Repeat echocardiogram to reassess LV function.  Hold Demadex.  We will treat with Lasix 40 mg IV twice daily and follow renal function.  2 persistent atrial fibrillation-I think this is contributing to patient's congestive heart failure.  Plan as outlined above.  Begin IV Cardizem for rate control as well as Eliquis.  Proceed with TEE guided cardioversion.  Will likely need to initiate amiodarone once sinus is reestablished.  Flecainide was discontinued previously due to newly diagnosed coronary disease.  3 hypertension-patient's blood pressure is controlled.  Continue present medications and follow.  4 coronary artery disease-continue statin.  Discontinue aspirin and Plavix given need for apixaban.  5 hyperlipidemia-continue statin.  6 history of chronic stage III kidney disease-follow renal function closely with diuresis.  Kirk Ruths, MD

## 2020-03-10 ENCOUNTER — Inpatient Hospital Stay (HOSPITAL_COMMUNITY): Payer: Medicare Other

## 2020-03-10 ENCOUNTER — Encounter (HOSPITAL_COMMUNITY): Payer: Self-pay | Admitting: Cardiology

## 2020-03-10 ENCOUNTER — Encounter (HOSPITAL_COMMUNITY)
Admission: AD | Disposition: A | Discharge status: Home / Self Care | Payer: Self-pay | Source: Ambulatory Visit | Attending: Cardiology

## 2020-03-10 ENCOUNTER — Inpatient Hospital Stay (HOSPITAL_COMMUNITY): Payer: Medicare Other | Admitting: Certified Registered"

## 2020-03-10 ENCOUNTER — Ambulatory Visit (HOSPITAL_COMMUNITY): Admission: RE | Admit: 2020-03-10 | Payer: Medicare Other | Source: Home / Self Care | Admitting: Cardiovascular Disease

## 2020-03-10 DIAGNOSIS — I371 Nonrheumatic pulmonary valve insufficiency: Secondary | ICD-10-CM | POA: Diagnosis not present

## 2020-03-10 DIAGNOSIS — I5033 Acute on chronic diastolic (congestive) heart failure: Secondary | ICD-10-CM | POA: Diagnosis not present

## 2020-03-10 DIAGNOSIS — I340 Nonrheumatic mitral (valve) insufficiency: Secondary | ICD-10-CM

## 2020-03-10 HISTORY — PX: TEE WITHOUT CARDIOVERSION: SHX5443

## 2020-03-10 LAB — BASIC METABOLIC PANEL
Anion gap: 10 (ref 5–15)
BUN: 25 mg/dL — ABNORMAL HIGH (ref 8–23)
CO2: 38 mmol/L — ABNORMAL HIGH (ref 22–32)
Calcium: 8.6 mg/dL — ABNORMAL LOW (ref 8.9–10.3)
Chloride: 88 mmol/L — ABNORMAL LOW (ref 98–111)
Creatinine, Ser: 1.6 mg/dL — ABNORMAL HIGH (ref 0.44–1.00)
GFR, Estimated: 32 mL/min — ABNORMAL LOW (ref >60)
Glucose, Bld: 90 mg/dL (ref 70–99)
Potassium: 3.4 mmol/L — ABNORMAL LOW (ref 3.5–5.1)
Sodium: 136 mmol/L (ref 135–145)

## 2020-03-10 LAB — T3, FREE: T3, Free: 2.6 pg/mL (ref 2.0–4.4)

## 2020-03-10 SURGERY — ECHOCARDIOGRAM, TRANSESOPHAGEAL
Anesthesia: Monitor Anesthesia Care

## 2020-03-10 MED ORDER — PERFLUTREN LIPID MICROSPHERE
1.0000 mL | INTRAVENOUS | Status: AC | PRN
  Administered 2020-03-10: 2 mL via INTRAVENOUS
  Filled 2020-03-10: qty 10

## 2020-03-10 MED ORDER — FUROSEMIDE 10 MG/ML IJ SOLN
80.0000 mg | Freq: Once | INTRAMUSCULAR | Status: AC
  Administered 2020-03-10: 80 mg via INTRAVENOUS
  Filled 2020-03-10: qty 8

## 2020-03-10 MED ORDER — LACTATED RINGERS IV SOLN: INTRAVENOUS | Status: DC | PRN

## 2020-03-10 MED ORDER — POTASSIUM CHLORIDE CRYS ER 20 MEQ PO TBCR: 20.0000 meq | EXTENDED_RELEASE_TABLET | Freq: Every day | ORAL | Status: DC

## 2020-03-10 MED ORDER — POTASSIUM CHLORIDE CRYS ER 20 MEQ PO TBCR
40.0000 meq | EXTENDED_RELEASE_TABLET | Freq: Every day | ORAL | Status: DC
  Administered 2020-03-10: 40 meq via ORAL
  Filled 2020-03-10 (×2): qty 2

## 2020-03-10 MED ORDER — BUTAMBEN-TETRACAINE-BENZOCAINE 2-2-14 % EX AERO
INHALATION_SPRAY | CUTANEOUS | Status: DC | PRN
  Administered 2020-03-10: 1 via TOPICAL

## 2020-03-10 MED ORDER — METOPROLOL TARTRATE 50 MG PO TABS
50.0000 mg | ORAL_TABLET | Freq: Two times a day (BID) | ORAL | Status: DC
  Administered 2020-03-10: 50 mg via ORAL
  Filled 2020-03-10: qty 1

## 2020-03-10 MED ORDER — PROPOFOL 500 MG/50ML IV EMUL
INTRAVENOUS | Status: DC | PRN
  Administered 2020-03-10: 100 ug/kg/min via INTRAVENOUS

## 2020-03-10 NOTE — CV Procedure (Signed)
TEE: Anesthesia: Propofol  Large broccoli apearing LAA with dense spontaneous contrast and 1 x 1 cm acute on chronic Thrombus near tip. Confirmed on orthogonal views and with definity Bi atrial enlargement  Mild MR Mild TR Moderate PR AV sclerosis  No effusion  RV enlargement with moderate reduction in function   [facility] not performed Discussed with Dr Harrington Challenger Patient has only received 4 doses of Eliquis   Jenkins Rouge MD [facility]

## 2020-03-10 NOTE — H&P (View-Only) (Signed)
Progress Note  Patient Name: [name] Date of Encounter: 03/10/2020  [facility] Cardiologist: Kirk Ruths, MD   Subjective   Pt still with SOB   "My mouth is dry"  No CP   Inpatient Medications    Scheduled Meds: . apixaban  2.5 mg Oral BID  . docusate sodium  100 mg Oral BID  . furosemide  40 mg Intravenous BID  . hydrocerin   Topical BID  . isosorbide mononitrate  30 mg Oral Daily  . metoprolol tartrate  25 mg Oral BID  . pantoprazole  40 mg Oral Daily  . rosuvastatin  10 mg Oral Daily   Continuous Infusions: . sodium chloride    . diltiazem (CARDIZEM) infusion Stopped (03/09/20 1400)   PRN Meds: acetaminophen, clonazePAM, hydrocortisone cream, hydrOXYzine, menthol-cetylpyridinium, nitroGLYCERIN, ondansetron (ZOFRAN) IV   Vital Signs    Vitals:   03/09/20 2011 03/10/20 0054 03/10/20 0504 03/10/20 0507  BP: (!) 156/93 (!) 154/87 126/68   Pulse: 95 81 91   Resp: 20 17 17    Temp: 97.6 F (36.4 C) 98 F (36.7 C) 98.3 F (36.8 C)   TempSrc: Oral Oral Oral   SpO2: 94% 90% 96%   Weight:    48.2 kg  Height:        Intake/Output Summary (Last 24 hours) at 03/10/2020 0809 Last data filed at 03/09/2020 1823 Gross per 24 hour  Intake 720 ml  Output 1000 ml  Net -280 ml   Net 1.8 L   Last 3 Weights 03/10/2020 03/09/2020 03/08/2020  Weight (lbs) 106 lb 4.2 oz 106 lb 0.7 oz 107 lb  Weight (kg) 48.2 kg 48.1 kg 48.535 kg      Telemetry    Afib 80-90s- Personally Reviewed  ECG    No new  - Personally Reviewed  Physical Exam   GEN: Thin 85 yo in NAD  Neck: No JVD Cardiac: Irreg irreg   NO S3   Respiratory: Decreased airflow   GI: Soft, nontender, non-distended  MS: TrLE  edema; No deformity. Neuro:  Nonfocal  Psych: Normal affect   Labs    High Sensitivity Troponin:  No results for input(s): TROPONINIHS in the last 720 hours.    Chemistry Recent Labs  Lab 03/08/20 1900 03/09/20 0524 03/10/20 0236  NA 139 137 136  K 4.7 2.9* 3.4*  CL 89*  88* 88*  CO2 37* 33* 38*  GLUCOSE 75 112* 90  BUN 36* 35* 25*  CREATININE 1.86* 1.71* 1.60*  CALCIUM 9.3 8.7* 8.6*  PROT 6.1*  --   --   ALBUMIN 3.0*  --   --   AST 52*  --   --   ALT 36  --   --   ALKPHOS 59  --   --   BILITOT 0.9  --   --   GFRNONAA 27* 30* 32*  ANIONGAP 13 16* 10     Hematology Recent Labs  Lab 03/08/20 1900 03/09/20 0524  WBC 10.5 10.7*  RBC 4.89 4.52  HGB 13.0 12.0  HCT 41.0 37.7  MCV 83.8 83.4  MCH 26.6 26.5  MCHC 31.7 31.8  RDW 16.0* 16.2*  PLT 412* 377    BNP Recent Labs  Lab 03/08/20 1900  BNP 3,154.1*     DDimer No results for input(s): DDIMER in the last 168 hours.   Radiology    DG CHEST PORT 1 VIEW  Result Date: 03/08/2020 CLINICAL DATA:  Chest pain and cough today. EXAM:  PORTABLE CHEST 1 VIEW COMPARISON:  02/03/2020 FINDINGS: Cardiac silhouette is mildly enlarged. Aorta is densely calcified. No mediastinal or hilar masses. There is opacity at both lung bases consistent with pleural effusions, which obscure the hemidiaphragms. Mild associated dependent lung base atelectasis. Remainder of the lungs is essentially clear with no convincing pneumonia or pulmonary edema. No pneumothorax. Skeletal structures are grossly intact. IMPRESSION: 1. Moderate bilateral pleural effusions with associated lung base opacity, the latter consistent with atelectasis. No convincing pneumonia or pulmonary edema and no change from the prior study. Electronically Signed   By: Lajean Manes M.D.   On: 03/08/2020 19:26   DG Foot 2 Views Right  Result Date: 03/08/2020 CLINICAL DATA:  Right foot pain and swelling. Dropped a heavy object on the right foot yesterday. EXAM: RIGHT FOOT - 2 VIEW COMPARISON:  None. FINDINGS: No fracture or bone lesion. Joints are normally aligned. Skeletal structures are demineralized. There are small dorsal and plantar calcaneal spurs. There is dorsal forefoot soft tissue swelling. IMPRESSION: No fracture or dislocation Electronically  Signed   By: Lajean Manes M.D.   On: 03/08/2020 19:24   ECHOCARDIOGRAM COMPLETE  Result Date: 03/09/2020    ECHOCARDIOGRAM REPORT   Patient Name:   [name] Date of Exam: 03/09/2020 Medical Rec #:  [id number]     Height:       64.0 in Accession #:    [id number]    Weight:       106.0 lb Date of Birth:  1935/09/15     BSA:          1.494 m Patient Age:    59 years      BP:           153/69 mmHg Patient Gender: F             HR:           93 bpm. Exam Location:  Inpatient Procedure: 2D Echo, Cardiac Doppler and Color Doppler Indications:    Atrial fibrillation  History:        Patient has prior history of Echocardiogram examinations, most                 recent 09/16/2019. CAD and Previous Myocardial Infarction, COPD,                 Arrythmias:Atrial Fibrillation, Signs/Symptoms:Shortness of                 Breath; Risk Factors:Hypertension and Dyslipidemia.  Sonographer:    Dustin Flock Referring Phys: [id number] [place]  1. Left ventricular ejection fraction, by estimation, is 35 to 40%. The left ventricle has moderately decreased function. The left ventricle demonstrates global hypokinesis. There is mild concentric left ventricular hypertrophy. Left ventricular diastolic function could not be evaluated.  2. Right ventricular systolic function is moderately reduced. The right ventricular size is mildly enlarged. There is severely elevated pulmonary artery systolic pressure. The estimated right ventricular systolic pressure is 71.2 mmHg.  3. Left atrial size was mild to moderately dilated.  4. Right atrial size was mildly dilated.  5. The mitral valve is grossly normal. Mild to moderate mitral valve regurgitation. No evidence of mitral stenosis.  6. The aortic valve is tricuspid. There is mild calcification of the aortic valve. There is mild thickening of the aortic valve. Aortic valve regurgitation is mild. Mild aortic valve sclerosis is present, with no evidence of aortic valve  stenosis.  7. The inferior vena  cava is dilated in size with <50% respiratory variability, suggesting right atrial pressure of 15 mmHg. Comparison(s): Changes from prior study are noted. EF now moderately reduced 35-40%. Global HK. RVSP elevated ~69 mmHG. FINDINGS  Left Ventricle: Left ventricular ejection fraction, by estimation, is 35 to 40%. The left ventricle has moderately decreased function. The left ventricle demonstrates global hypokinesis. The left ventricular internal cavity size was normal in size. There is mild concentric left ventricular hypertrophy. Left ventricular diastolic function could not be evaluated due to atrial fibrillation. Left ventricular diastolic function could not be evaluated. Right Ventricle: The right ventricular size is mildly enlarged. No increase in right ventricular wall thickness. Right ventricular systolic function is moderately reduced. There is severely elevated pulmonary artery systolic pressure. The tricuspid regurgitant velocity is 3.68 m/s, and with an assumed right atrial pressure of 15 mmHg, the estimated right ventricular systolic pressure is 16.1 mmHg. Left Atrium: Left atrial size was mild to moderately dilated. Right Atrium: Right atrial size was mildly dilated. Pericardium: Trivial pericardial effusion is present. Mitral Valve: The mitral valve is grossly normal. Mild to moderate mitral valve regurgitation. No evidence of mitral valve stenosis. Tricuspid Valve: The tricuspid valve is grossly normal. Tricuspid valve regurgitation is mild . No evidence of tricuspid stenosis. Aortic Valve: The aortic valve is tricuspid. There is mild calcification of the aortic valve. There is mild thickening of the aortic valve. Aortic valve regurgitation is mild. Aortic regurgitation PHT measures 296 msec. Mild aortic valve sclerosis is present, with no evidence of aortic valve stenosis. Pulmonic Valve: The pulmonic valve was grossly normal. Pulmonic valve regurgitation is trivial.  No evidence of pulmonic stenosis. Aorta: The aortic root is normal in size and structure. Venous: The inferior vena cava is dilated in size with less than 50% respiratory variability, suggesting right atrial pressure of 15 mmHg. IAS/Shunts: The atrial septum is grossly normal. Additional Comments: There is a small pleural effusion in the left lateral region.  LEFT VENTRICLE PLAX 2D LVIDd:         4.50 cm  Diastology LVIDs:         3.00 cm  LV e' medial:    4.03 cm/s LV PW:         1.30 cm  LV E/e' medial:  21.2 LV IVS:        1.20 cm  LV e' lateral:   8.59 cm/s LVOT diam:     2.20 cm  LV E/e' lateral: 9.9 LV SV:         71 LV SV Index:   47 LVOT Area:     3.80 cm  RIGHT VENTRICLE RV Basal diam:  2.30 cm RV S prime:     8.92 cm/s TAPSE (M-mode): 1.4 cm LEFT ATRIUM             Index       RIGHT ATRIUM           Index LA diam:        4.10 cm 2.74 cm/m  RA Area:     18.40 cm LA Vol (A2C):   65.6 ml 43.91 ml/m RA Volume:   48.90 ml  32.73 ml/m LA Vol (A4C):   45.3 ml 30.32 ml/m LA Biplane Vol: 55.8 ml 37.35 ml/m  AORTIC VALVE LVOT Vmax:   91.40 cm/s LVOT Vmean:  67.000 cm/s LVOT VTI:    0.186 m AI PHT:      296 msec  AORTA Ao Root diam: 2.80 cm MITRAL VALVE  TRICUSPID VALVE MV Area (PHT): 6.54 cm    TR Peak grad:   54.2 mmHg MV Decel Time: 116 msec    TR Vmax:        368.00 cm/s MV E velocity: 85.30 cm/s MV A velocity: 37.70 cm/s  SHUNTS MV E/A ratio:  2.26        Systemic VTI:  0.19 m                            Systemic Diam: 2.20 cm Eleonore Chiquito MD Electronically signed by Eleonore Chiquito MD Signature Date/Time: 03/09/2020/12:57:38 PM    Final     Cardiac Studies   Echo 03/10/19  1. Left ventricular ejection fraction, by estimation, is 35 to 40%. The left ventricle has moderately decreased function. The left ventricle demonstrates global hypokinesis. There is mild concentric left ventricular hypertrophy. Left ventricular diastolic function could not be evaluated. 2. Right ventricular systolic  function is moderately reduced. The right ventricular size is mildly enlarged. There is severely elevated pulmonary artery systolic pressure. The estimated right ventricular systolic pressure is 50.0 mmHg. 3. Left atrial size was mild to moderately dilated. 4. Right atrial size was mildly dilated. 5. The mitral valve is grossly normal. Mild to moderate mitral valve regurgitation. No evidence of mitral stenosis. 6. The aortic valve is tricuspid. There is mild calcification of the aortic valve. There is mild thickening of the aortic valve. Aortic valve regurgitation is mild. Mild aortic valve sclerosis is present, with no evidence of aortic valve stenosis. 7. The inferior vena cava is dilated in size with <50% respiratory variability, suggesting right atrial pressure of 15 mmHg.  Patient Profile     85 y.o. female with Hx of CAD, PAF admitted from clinic yesterday for Rx of CHF    Assessment & Plan    1  Acute CHF Pt had been admitted in July with diastolic CHF  Pt admitted from [place] on Monday for CHF and recurrent afib with RVR  She had not been taking her diuretic.  Echo yestrday shows LVEF is down from previous  May be related to higher heart rates (tachy induced)  She has now put out only 1.4 L   Clinically improved from Monday  Hold further lasix   REceived 40 KCL this AM       2  Atrial fibrillation   Felt contributing to problem problem Rates now  controlled     Plan for TEE cardioversion today   I would not plan on antiarrhythmic at this point. On anticoagulant now (Eliquis 2.5 bid)    3  HTN   BP labile  FOllow for now    4 CAD   Patient had MI in June 2021   Cath with occluded D1 and 30% mid LAD  Underwent PTCA  No symptoms of angina  Plan for rhythm control and follow up of LV function    5  HL  On statin   6   CKD  Cr improved today at 1.6  Repleting K   Hold lasix IV for now  Pt lives at home alone  Had been doing good until this summer   For questions or updates,  please contact [facility] Please consult [email or web address] for contact info under        Signed, Dorris Carnes, MD  03/10/2020, 8:09 AM

## 2020-03-10 NOTE — Anesthesia Postprocedure Evaluation (Signed)
Anesthesia Post Note  Patient: [name]  Procedure(s) Performed: TRANSESOPHAGEAL ECHOCARDIOGRAM (TEE) (N/A )     Patient location during evaluation: Endoscopy Anesthesia Type: MAC Level of consciousness: awake and alert Pain management: pain level controlled Vital Signs Assessment: post-procedure vital signs reviewed and stable Respiratory status: spontaneous breathing, nonlabored ventilation and respiratory function stable Cardiovascular status: blood pressure returned to baseline and stable Postop Assessment: no apparent nausea or vomiting Anesthetic complications: no   No complications documented.  Last Vitals:  Vitals:   03/10/20 1224 03/10/20 1230  BP: (!) 134/59 (!) 151/68  Pulse: 79 73  Resp: 16 14  Temp: (!) 36.2 C   SpO2: 97% 98%    Last Pain:  Vitals:   03/10/20 1230  TempSrc:   PainSc: 0-No pain                 Merlinda Frederick

## 2020-03-10 NOTE — Progress Notes (Signed)
Note results of TEE   Thrombus present   Will need anticoagulation for 3 to 4 wks before cardioversion  Plan for rate control  Will increase metoprolol as BP tolerates Volume status does not appear bad but will give additional lasix   Dorris Carnes MD

## 2020-03-10 NOTE — Progress Notes (Signed)
Progress Note  Patient Name: [name] Date of Encounter: 03/10/2020  [facility] Cardiologist: Kirk Ruths, MD   Subjective   Pt still with SOB   "My mouth is dry"  No CP   Inpatient Medications    Scheduled Meds: . apixaban  2.5 mg Oral BID  . docusate sodium  100 mg Oral BID  . furosemide  40 mg Intravenous BID  . hydrocerin   Topical BID  . isosorbide mononitrate  30 mg Oral Daily  . metoprolol tartrate  25 mg Oral BID  . pantoprazole  40 mg Oral Daily  . rosuvastatin  10 mg Oral Daily   Continuous Infusions: . sodium chloride    . diltiazem (CARDIZEM) infusion Stopped (03/09/20 1400)   PRN Meds: acetaminophen, clonazePAM, hydrocortisone cream, hydrOXYzine, menthol-cetylpyridinium, nitroGLYCERIN, ondansetron (ZOFRAN) IV   Vital Signs    Vitals:   03/09/20 2011 03/10/20 0054 03/10/20 0504 03/10/20 0507  BP: (!) 156/93 (!) 154/87 126/68   Pulse: 95 81 91   Resp: 20 17 17    Temp: 97.6 F (36.4 C) 98 F (36.7 C) 98.3 F (36.8 C)   TempSrc: Oral Oral Oral   SpO2: 94% 90% 96%   Weight:    48.2 kg  Height:        Intake/Output Summary (Last 24 hours) at 03/10/2020 0809 Last data filed at 03/09/2020 1823 Gross per 24 hour  Intake 720 ml  Output 1000 ml  Net -280 ml   Net 1.8 L   Last 3 Weights 03/10/2020 03/09/2020 03/08/2020  Weight (lbs) 106 lb 4.2 oz 106 lb 0.7 oz 107 lb  Weight (kg) 48.2 kg 48.1 kg 48.535 kg      Telemetry    Afib 80-90s- Personally Reviewed  ECG    No new  - Personally Reviewed  Physical Exam   GEN: Thin 85 yo in NAD  Neck: No JVD Cardiac: Irreg irreg   NO S3   Respiratory: Decreased airflow   GI: Soft, nontender, non-distended  MS: TrLE  edema; No deformity. Neuro:  Nonfocal  Psych: Normal affect   Labs    High Sensitivity Troponin:  No results for input(s): TROPONINIHS in the last 720 hours.    Chemistry Recent Labs  Lab 03/08/20 1900 03/09/20 0524 03/10/20 0236  NA 139 137 136  K 4.7 2.9* 3.4*  CL 89*  88* 88*  CO2 37* 33* 38*  GLUCOSE 75 112* 90  BUN 36* 35* 25*  CREATININE 1.86* 1.71* 1.60*  CALCIUM 9.3 8.7* 8.6*  PROT 6.1*  --   --   ALBUMIN 3.0*  --   --   AST 52*  --   --   ALT 36  --   --   ALKPHOS 59  --   --   BILITOT 0.9  --   --   GFRNONAA 27* 30* 32*  ANIONGAP 13 16* 10     Hematology Recent Labs  Lab 03/08/20 1900 03/09/20 0524  WBC 10.5 10.7*  RBC 4.89 4.52  HGB 13.0 12.0  HCT 41.0 37.7  MCV 83.8 83.4  MCH 26.6 26.5  MCHC 31.7 31.8  RDW 16.0* 16.2*  PLT 412* 377    BNP Recent Labs  Lab 03/08/20 1900  BNP 3,154.1*     DDimer No results for input(s): DDIMER in the last 168 hours.   Radiology    DG CHEST PORT 1 VIEW  Result Date: 03/08/2020 CLINICAL DATA:  Chest pain and cough today. EXAM:  PORTABLE CHEST 1 VIEW COMPARISON:  02/03/2020 FINDINGS: Cardiac silhouette is mildly enlarged. Aorta is densely calcified. No mediastinal or hilar masses. There is opacity at both lung bases consistent with pleural effusions, which obscure the hemidiaphragms. Mild associated dependent lung base atelectasis. Remainder of the lungs is essentially clear with no convincing pneumonia or pulmonary edema. No pneumothorax. Skeletal structures are grossly intact. IMPRESSION: 1. Moderate bilateral pleural effusions with associated lung base opacity, the latter consistent with atelectasis. No convincing pneumonia or pulmonary edema and no change from the prior study. Electronically Signed   By: Lajean Manes M.D.   On: 03/08/2020 19:26   DG Foot 2 Views Right  Result Date: 03/08/2020 CLINICAL DATA:  Right foot pain and swelling. Dropped a heavy object on the right foot yesterday. EXAM: RIGHT FOOT - 2 VIEW COMPARISON:  None. FINDINGS: No fracture or bone lesion. Joints are normally aligned. Skeletal structures are demineralized. There are small dorsal and plantar calcaneal spurs. There is dorsal forefoot soft tissue swelling. IMPRESSION: No fracture or dislocation Electronically  Signed   By: Lajean Manes M.D.   On: 03/08/2020 19:24   ECHOCARDIOGRAM COMPLETE  Result Date: 03/09/2020    ECHOCARDIOGRAM REPORT   Patient Name:   [name] Date of Exam: 03/09/2020 Medical Rec #:  [id number]     Height:       64.0 in Accession #:    [id number]    Weight:       106.0 lb Date of Birth:  02/12/1936     BSA:          1.494 m Patient Age:    85 years      BP:           153/69 mmHg Patient Gender: F             HR:           93 bpm. Exam Location:  Inpatient Procedure: 2D Echo, Cardiac Doppler and Color Doppler Indications:    Atrial fibrillation  History:        Patient has prior history of Echocardiogram examinations, most                 recent 09/16/2019. CAD and Previous Myocardial Infarction, COPD,                 Arrythmias:Atrial Fibrillation, Signs/Symptoms:Shortness of                 Breath; Risk Factors:Hypertension and Dyslipidemia.  Sonographer:    Dustin Flock Referring Phys: [id number] [place]  1. Left ventricular ejection fraction, by estimation, is 35 to 40%. The left ventricle has moderately decreased function. The left ventricle demonstrates global hypokinesis. There is mild concentric left ventricular hypertrophy. Left ventricular diastolic function could not be evaluated.  2. Right ventricular systolic function is moderately reduced. The right ventricular size is mildly enlarged. There is severely elevated pulmonary artery systolic pressure. The estimated right ventricular systolic pressure is 67.3 mmHg.  3. Left atrial size was mild to moderately dilated.  4. Right atrial size was mildly dilated.  5. The mitral valve is grossly normal. Mild to moderate mitral valve regurgitation. No evidence of mitral stenosis.  6. The aortic valve is tricuspid. There is mild calcification of the aortic valve. There is mild thickening of the aortic valve. Aortic valve regurgitation is mild. Mild aortic valve sclerosis is present, with no evidence of aortic valve  stenosis.  7. The inferior vena  cava is dilated in size with <50% respiratory variability, suggesting right atrial pressure of 15 mmHg. Comparison(s): Changes from prior study are noted. EF now moderately reduced 35-40%. Global HK. RVSP elevated ~69 mmHG. FINDINGS  Left Ventricle: Left ventricular ejection fraction, by estimation, is 35 to 40%. The left ventricle has moderately decreased function. The left ventricle demonstrates global hypokinesis. The left ventricular internal cavity size was normal in size. There is mild concentric left ventricular hypertrophy. Left ventricular diastolic function could not be evaluated due to atrial fibrillation. Left ventricular diastolic function could not be evaluated. Right Ventricle: The right ventricular size is mildly enlarged. No increase in right ventricular wall thickness. Right ventricular systolic function is moderately reduced. There is severely elevated pulmonary artery systolic pressure. The tricuspid regurgitant velocity is 3.68 m/s, and with an assumed right atrial pressure of 15 mmHg, the estimated right ventricular systolic pressure is 50.0 mmHg. Left Atrium: Left atrial size was mild to moderately dilated. Right Atrium: Right atrial size was mildly dilated. Pericardium: Trivial pericardial effusion is present. Mitral Valve: The mitral valve is grossly normal. Mild to moderate mitral valve regurgitation. No evidence of mitral valve stenosis. Tricuspid Valve: The tricuspid valve is grossly normal. Tricuspid valve regurgitation is mild . No evidence of tricuspid stenosis. Aortic Valve: The aortic valve is tricuspid. There is mild calcification of the aortic valve. There is mild thickening of the aortic valve. Aortic valve regurgitation is mild. Aortic regurgitation PHT measures 296 msec. Mild aortic valve sclerosis is present, with no evidence of aortic valve stenosis. Pulmonic Valve: The pulmonic valve was grossly normal. Pulmonic valve regurgitation is trivial.  No evidence of pulmonic stenosis. Aorta: The aortic root is normal in size and structure. Venous: The inferior vena cava is dilated in size with less than 50% respiratory variability, suggesting right atrial pressure of 15 mmHg. IAS/Shunts: The atrial septum is grossly normal. Additional Comments: There is a small pleural effusion in the left lateral region.  LEFT VENTRICLE PLAX 2D LVIDd:         4.50 cm  Diastology LVIDs:         3.00 cm  LV e' medial:    4.03 cm/s LV PW:         1.30 cm  LV E/e' medial:  21.2 LV IVS:        1.20 cm  LV e' lateral:   8.59 cm/s LVOT diam:     2.20 cm  LV E/e' lateral: 9.9 LV SV:         71 LV SV Index:   47 LVOT Area:     3.80 cm  RIGHT VENTRICLE RV Basal diam:  2.30 cm RV S prime:     8.92 cm/s TAPSE (M-mode): 1.4 cm LEFT ATRIUM             Index       RIGHT ATRIUM           Index LA diam:        4.10 cm 2.74 cm/m  RA Area:     18.40 cm LA Vol (A2C):   65.6 ml 43.91 ml/m RA Volume:   48.90 ml  32.73 ml/m LA Vol (A4C):   45.3 ml 30.32 ml/m LA Biplane Vol: 55.8 ml 37.35 ml/m  AORTIC VALVE LVOT Vmax:   91.40 cm/s LVOT Vmean:  67.000 cm/s LVOT VTI:    0.186 m AI PHT:      296 msec  AORTA Ao Root diam: 2.80 cm MITRAL VALVE  TRICUSPID VALVE MV Area (PHT): 6.54 cm    TR Peak grad:   54.2 mmHg MV Decel Time: 116 msec    TR Vmax:        368.00 cm/s MV E velocity: 85.30 cm/s MV A velocity: 37.70 cm/s  SHUNTS MV E/A ratio:  2.26        Systemic VTI:  0.19 m                            Systemic Diam: 2.20 cm Eleonore Chiquito MD Electronically signed by Eleonore Chiquito MD Signature Date/Time: 03/09/2020/12:57:38 PM    Final     Cardiac Studies   Echo 03/10/19  1. Left ventricular ejection fraction, by estimation, is 35 to 40%. The left ventricle has moderately decreased function. The left ventricle demonstrates global hypokinesis. There is mild concentric left ventricular hypertrophy. Left ventricular diastolic function could not be evaluated. 2. Right ventricular systolic  function is moderately reduced. The right ventricular size is mildly enlarged. There is severely elevated pulmonary artery systolic pressure. The estimated right ventricular systolic pressure is 46.5 mmHg. 3. Left atrial size was mild to moderately dilated. 4. Right atrial size was mildly dilated. 5. The mitral valve is grossly normal. Mild to moderate mitral valve regurgitation. No evidence of mitral stenosis. 6. The aortic valve is tricuspid. There is mild calcification of the aortic valve. There is mild thickening of the aortic valve. Aortic valve regurgitation is mild. Mild aortic valve sclerosis is present, with no evidence of aortic valve stenosis. 7. The inferior vena cava is dilated in size with <50% respiratory variability, suggesting right atrial pressure of 15 mmHg.  Patient Profile     85 y.o. female with Hx of CAD, PAF admitted from clinic yesterday for Rx of CHF    Assessment & Plan    1  Acute CHF Pt had been admitted in July with diastolic CHF  Pt admitted from [place] [facility] on Monday for CHF and recurrent afib with RVR  She had not been taking her diuretic.  Echo yestrday shows LVEF is down from previous  May be related to higher heart rates (tachy induced)  She has now put out only 1.4 L   Clinically improved from Monday  Hold further lasix   REceived 40 KCL this AM       2  Atrial fibrillation   Felt contributing to problem problem Rates now  controlled     Plan for TEE cardioversion today   I would not plan on antiarrhythmic at this point. On anticoagulant now (Eliquis 2.5 bid)    3  HTN   BP labile  FOllow for now    4 CAD   Patient had MI in June 2021   Cath with occluded D1 and 30% mid LAD  Underwent PTCA  No symptoms of angina  Plan for rhythm control and follow up of LV function    5  HL  On statin   6   CKD  Cr improved today at 1.6  Repleting K   Hold lasix IV for now  Pt lives at home alone  Had been doing good until this summer   For questions or updates,  please contact [facility] Please consult [email or web address] for contact info under        Signed, Dorris Carnes, MD  03/10/2020, 8:09 AM

## 2020-03-10 NOTE — Anesthesia Preprocedure Evaluation (Addendum)
Anesthesia Evaluation  Patient identified by MRN, date of birth, ID band Patient awake    Reviewed: Allergy & Precautions, NPO status , Patient's Chart, lab work & pertinent test results  History of Anesthesia Complications (+) PONV  Airway Mallampati: II  TM Distance: >3 FB Neck ROM: Full    Dental no notable dental hx. (+) Lower Dentures, Upper Dentures   Pulmonary pneumonia, COPD, former smoker,    Pulmonary exam normal breath sounds clear to auscultation       Cardiovascular hypertension, + CAD, + Past MI and +CHF   Rhythm:Irregular Rate:Normal     Neuro/Psych  Headaches, PSYCHIATRIC DISORDERS Anxiety Depression    GI/Hepatic PUD, GERD  ,  Endo/Other    Renal/GU Renal disease     Musculoskeletal  (+) Arthritis , Osteoarthritis,    Abdominal Normal abdominal exam  (+)   Peds  Hematology  (+) anemia ,   Anesthesia Other Findings   Reproductive/Obstetrics                           Anesthesia Physical Anesthesia Plan  ASA: III  Anesthesia Plan: MAC   Post-op Pain Management:    Induction:   PONV Risk Score and Plan: 2 and Propofol infusion, TIVA and Ondansetron  Airway Management Planned: Natural Airway and Simple Face Mask  Additional Equipment:   Intra-op Plan:   Post-operative Plan:   Informed Consent: I have reviewed the patients History and Physical, chart, labs and discussed the procedure including the risks, benefits and alternatives for the proposed anesthesia with the patient or authorized representative who has indicated his/her understanding and acceptance.       Plan Discussed with: CRNA and Anesthesiologist  Anesthesia Plan Comments:         Anesthesia Quick Evaluation

## 2020-03-10 NOTE — Interval H&P Note (Signed)
History and Physical Interval Note:  03/10/2020 11:24 AM  [name]  has presented today for surgery, with the diagnosis of AFIB.  The various methods of treatment have been discussed with the patient and family. After consideration of risks, benefits and other options for treatment, the patient has consented to  Procedure(s): TRANSESOPHAGEAL ECHOCARDIOGRAM (TEE) (N/A) CARDIOVERSION (N/A) as a surgical intervention.  The patient's history has been reviewed, patient examined, no change in status, stable for surgery.  I have reviewed the patient's chart and labs.  Questions were answered to the patient's satisfaction.     Jenkins Rouge

## 2020-03-10 NOTE — Transfer of Care (Signed)
Immediate Anesthesia Transfer of Care Note  Patient: [name]  Procedure(s) Performed: TRANSESOPHAGEAL ECHOCARDIOGRAM (TEE) (N/A )  Patient Location: Endoscopy Unit  Anesthesia Type:MAC  Level of Consciousness: lethargic and responds to stimulation  Airway & Oxygen Therapy: Patient Spontanous Breathing and Patient connected to nasal cannula oxygen  Post-op Assessment: Report given to RN  Post vital signs: Reviewed and stable  Last Vitals:  Vitals Value Taken Time  BP    Temp    Pulse 72 03/10/20 1222  Resp 14 03/10/20 1222  SpO2 97 % 03/10/20 1222  Vitals shown include unvalidated device data.  Last Pain:  Vitals:   03/10/20 1124  TempSrc: Tympanic  PainSc: 0-No pain         Complications: No complications documented.

## 2020-03-10 NOTE — Progress Notes (Signed)
  Echocardiogram Echocardiogram Transesophageal has been performed.  [name] 03/10/2020, 12:24 PM

## 2020-03-10 NOTE — Anesthesia Procedure Notes (Signed)
Procedure Name: MAC Date/Time: 03/10/2020 11:50 AM Performed by: Barrington Ellison, CRNA Pre-anesthesia Checklist: Patient identified, Emergency Drugs available, Suction available, Patient being monitored and Timeout performed Patient Re-evaluated:Patient Re-evaluated prior to induction Oxygen Delivery Method: Nasal cannula

## 2020-03-11 ENCOUNTER — Telehealth: Payer: Self-pay | Admitting: Cardiology

## 2020-03-11 DIAGNOSIS — I5033 Acute on chronic diastolic (congestive) heart failure: Secondary | ICD-10-CM | POA: Diagnosis not present

## 2020-03-11 LAB — BASIC METABOLIC PANEL
Anion gap: 14 (ref 5–15)
BUN: 26 mg/dL — ABNORMAL HIGH (ref 8–23)
CO2: 32 mmol/L (ref 22–32)
Calcium: 8.3 mg/dL — ABNORMAL LOW (ref 8.9–10.3)
Chloride: 89 mmol/L — ABNORMAL LOW (ref 98–111)
Creatinine, Ser: 1.66 mg/dL — ABNORMAL HIGH (ref 0.44–1.00)
GFR, Estimated: 31 mL/min — ABNORMAL LOW (ref >60)
Glucose, Bld: 80 mg/dL (ref 70–99)
Potassium: 3.2 mmol/L — ABNORMAL LOW (ref 3.5–5.1)
Sodium: 135 mmol/L (ref 135–145)

## 2020-03-11 MED ORDER — FUROSEMIDE 10 MG/ML IJ SOLN
80.0000 mg | Freq: Once | INTRAMUSCULAR | Status: AC
  Administered 2020-03-11: 80 mg via INTRAVENOUS
  Filled 2020-03-11: qty 8

## 2020-03-11 MED ORDER — POTASSIUM CHLORIDE CRYS ER 20 MEQ PO TBCR
40.0000 meq | EXTENDED_RELEASE_TABLET | Freq: Once | ORAL | Status: DC
  Filled 2020-03-11: qty 2

## 2020-03-11 MED ORDER — POTASSIUM CHLORIDE 20 MEQ PO PACK
40.0000 meq | PACK | Freq: Once | ORAL | Status: AC
  Administered 2020-03-11: 40 meq via ORAL
  Filled 2020-03-11: qty 2

## 2020-03-11 MED ORDER — POTASSIUM CHLORIDE 20 MEQ PO PACK
40.0000 meq | PACK | Freq: Every day | ORAL | Status: DC
  Administered 2020-03-12: 40 meq via ORAL
  Filled 2020-03-11: qty 2

## 2020-03-11 MED ORDER — METOPROLOL TARTRATE 25 MG PO TABS
25.0000 mg | ORAL_TABLET | Freq: Once | ORAL | Status: AC
  Administered 2020-03-11: 25 mg via ORAL
  Filled 2020-03-11: qty 1

## 2020-03-11 MED ORDER — METOPROLOL TARTRATE 50 MG PO TABS
75.0000 mg | ORAL_TABLET | Freq: Two times a day (BID) | ORAL | Status: DC
  Administered 2020-03-11: 75 mg via ORAL
  Filled 2020-03-11: qty 1

## 2020-03-11 MED ORDER — POTASSIUM CHLORIDE 20 MEQ PO PACK: 40.0000 meq | PACK | Freq: Once | ORAL | Status: DC

## 2020-03-11 MED ORDER — METOPROLOL TARTRATE 50 MG PO TABS
50.0000 mg | ORAL_TABLET | Freq: Four times a day (QID) | ORAL | Status: DC
  Administered 2020-03-11 – 2020-03-13 (×10): 50 mg via ORAL
  Filled 2020-03-11 (×10): qty 1

## 2020-03-11 MED ORDER — ENSURE ENLIVE PO LIQD
237.0000 mL | Freq: Three times a day (TID) | ORAL | Status: DC
  Administered 2020-03-11 – 2020-03-14 (×9): 237 mL via ORAL

## 2020-03-11 MED ORDER — ADULT MULTIVITAMIN W/MINERALS CH
1.0000 | ORAL_TABLET | Freq: Every day | ORAL | Status: DC
  Administered 2020-03-12 – 2020-03-14 (×3): 1 via ORAL
  Filled 2020-03-11 (×3): qty 1

## 2020-03-11 NOTE — Telephone Encounter (Signed)
Spoke with pt sister, questions regarding current hospitalization answered.

## 2020-03-11 NOTE — Telephone Encounter (Signed)
     Pt's sister would like to speak with a nurse about the pt. She said pt is in the hospital and no one is taking care of her there.

## 2020-03-11 NOTE — Progress Notes (Incomplete)
Progress Note  Patient Name: [name] Date of Encounter: 03/11/2020  [facility] Cardiologist: Kirk Ruths, MD ***  Subjective   ***  Inpatient Medications    Scheduled Meds: . apixaban  2.5 mg Oral BID  . docusate sodium  100 mg Oral BID  . hydrocerin   Topical BID  . isosorbide mononitrate  30 mg Oral Daily  . metoprolol tartrate  75 mg Oral BID  . pantoprazole  40 mg Oral Daily  . potassium chloride  40 mEq Oral Daily  . rosuvastatin  10 mg Oral Daily   Continuous Infusions:  PRN Meds: acetaminophen, clonazePAM, hydrocortisone cream, hydrOXYzine, menthol-cetylpyridinium, nitroGLYCERIN, ondansetron (ZOFRAN) IV   Vital Signs    Vitals:   03/10/20 1930 03/10/20 2201 03/11/20 0309 03/11/20 0316  BP: (!) 144/90 (!) 161/97  (!) 149/98  Pulse:  85  (!) 109  Resp: 17   15  Temp: (!) 97 F (36.1 C) (!) 97.1 F (36.2 C)  98.1 F (36.7 C)  TempSrc: Oral Oral  Oral  SpO2: 95%   96%  Weight:   46 kg   Height:        Intake/Output Summary (Last 24 hours) at 03/11/2020 0927 Last data filed at 03/10/2020 1939 Gross per 24 hour  Intake 150 ml  Output 1300 ml  Net -1150 ml   Last 3 Weights 03/11/2020 03/10/2020 03/10/2020  Weight (lbs) 101 lb 6.4 oz 107 lb 106 lb 4.2 oz  Weight (kg) 45.995 kg 48.535 kg 48.2 kg      Telemetry    *** - Personally Reviewed  ECG    *** - Personally Reviewed  Physical Exam  *** GEN: No acute distress.   Neck: No JVD Cardiac: RRR, no murmurs, rubs, or gallops.  Respiratory: Clear to auscultation bilaterally. GI: Soft, nontender, non-distended  MS: No edema; No deformity. Neuro:  Nonfocal  Psych: Normal affect   Labs    High Sensitivity Troponin:  No results for input(s): TROPONINIHS in the last 720 hours.    Chemistry Recent Labs  Lab 03/08/20 1900 03/09/20 0524 03/10/20 0236 03/11/20 0348  NA 139 137 136 135  K 4.7 2.9* 3.4* 3.2*  CL 89* 88* 88* 89*  CO2 37* 33* 38* 32  GLUCOSE 75 112* 90 80  BUN 36* 35* 25*  26*  CREATININE 1.86* 1.71* 1.60* 1.66*  CALCIUM 9.3 8.7* 8.6* 8.3*  PROT 6.1*  --   --   --   ALBUMIN 3.0*  --   --   --   AST 52*  --   --   --   ALT 36  --   --   --   ALKPHOS 59  --   --   --   BILITOT 0.9  --   --   --   GFRNONAA 27* 30* 32* 31*  ANIONGAP 13 16* 10 14     Hematology Recent Labs  Lab 03/08/20 1900 03/09/20 0524  WBC 10.5 10.7*  RBC 4.89 4.52  HGB 13.0 12.0  HCT 41.0 37.7  MCV 83.8 83.4  MCH 26.6 26.5  MCHC 31.7 31.8  RDW 16.0* 16.2*  PLT 412* 377    BNP Recent Labs  Lab 03/08/20 1900  BNP 3,154.1*     DDimer No results for input(s): DDIMER in the last 168 hours.   Radiology    ECHOCARDIOGRAM COMPLETE  Result Date: 03/09/2020    ECHOCARDIOGRAM REPORT   Patient Name:   [name]  Date of Exam: 03/09/2020 Medical Rec #:  [id number]     Height:       64.0 in Accession #:    [id number]    Weight:       106.0 lb Date of Birth:  [birth year]     BSA:          1.494 m Patient Age:    85 years      BP:           153/69 mmHg Patient Gender: F             HR:           93 bpm. Exam Location:  Inpatient Procedure: 2D Echo, Cardiac Doppler and Color Doppler Indications:    Atrial fibrillation  History:        Patient has prior history of Echocardiogram examinations, most                 recent 09/16/2019. CAD and Previous Myocardial Infarction, COPD,                 Arrythmias:Atrial Fibrillation, Signs/Symptoms:Shortness of                 Breath; Risk Factors:Hypertension and Dyslipidemia.  Sonographer:    Dustin Flock Referring Phys: [id number] [place]  1. Left ventricular ejection fraction, by estimation, is 35 to 40%. The left ventricle has moderately decreased function. The left ventricle demonstrates global hypokinesis. There is mild concentric left ventricular hypertrophy. Left ventricular diastolic function could not be evaluated.  2. Right ventricular systolic function is moderately reduced. The right ventricular size is mildly enlarged.  There is severely elevated pulmonary artery systolic pressure. The estimated right ventricular systolic pressure is 78.9 mmHg.  3. Left atrial size was mild to moderately dilated.  4. Right atrial size was mildly dilated.  5. The mitral valve is grossly normal. Mild to moderate mitral valve regurgitation. No evidence of mitral stenosis.  6. The aortic valve is tricuspid. There is mild calcification of the aortic valve. There is mild thickening of the aortic valve. Aortic valve regurgitation is mild. Mild aortic valve sclerosis is present, with no evidence of aortic valve stenosis.  7. The inferior vena cava is dilated in size with <50% respiratory variability, suggesting right atrial pressure of 15 mmHg. Comparison(s): Changes from prior study are noted. EF now moderately reduced 35-40%. Global HK. RVSP elevated ~69 mmHG. FINDINGS  Left Ventricle: Left ventricular ejection fraction, by estimation, is 35 to 40%. The left ventricle has moderately decreased function. The left ventricle demonstrates global hypokinesis. The left ventricular internal cavity size was normal in size. There is mild concentric left ventricular hypertrophy. Left ventricular diastolic function could not be evaluated due to atrial fibrillation. Left ventricular diastolic function could not be evaluated. Right Ventricle: The right ventricular size is mildly enlarged. No increase in right ventricular wall thickness. Right ventricular systolic function is moderately reduced. There is severely elevated pulmonary artery systolic pressure. The tricuspid regurgitant velocity is 3.68 m/s, and with an assumed right atrial pressure of 15 mmHg, the estimated right ventricular systolic pressure is 38.1 mmHg. Left Atrium: Left atrial size was mild to moderately dilated. Right Atrium: Right atrial size was mildly dilated. Pericardium: Trivial pericardial effusion is present. Mitral Valve: The mitral valve is grossly normal. Mild to moderate mitral valve  regurgitation. No evidence of mitral valve stenosis. Tricuspid Valve: The tricuspid valve is grossly normal. Tricuspid valve regurgitation is mild . No  evidence of tricuspid stenosis. Aortic Valve: The aortic valve is tricuspid. There is mild calcification of the aortic valve. There is mild thickening of the aortic valve. Aortic valve regurgitation is mild. Aortic regurgitation PHT measures 296 msec. Mild aortic valve sclerosis is present, with no evidence of aortic valve stenosis. Pulmonic Valve: The pulmonic valve was grossly normal. Pulmonic valve regurgitation is trivial. No evidence of pulmonic stenosis. Aorta: The aortic root is normal in size and structure. Venous: The inferior vena cava is dilated in size with less than 50% respiratory variability, suggesting right atrial pressure of 15 mmHg. IAS/Shunts: The atrial septum is grossly normal. Additional Comments: There is a small pleural effusion in the left lateral region.  LEFT VENTRICLE PLAX 2D LVIDd:         4.50 cm  Diastology LVIDs:         3.00 cm  LV e' medial:    4.03 cm/s LV PW:         1.30 cm  LV E/e' medial:  21.2 LV IVS:        1.20 cm  LV e' lateral:   8.59 cm/s LVOT diam:     2.20 cm  LV E/e' lateral: 9.9 LV SV:         71 LV SV Index:   47 LVOT Area:     3.80 cm  RIGHT VENTRICLE RV Basal diam:  2.30 cm RV S prime:     8.92 cm/s TAPSE (M-mode): 1.4 cm LEFT ATRIUM             Index       RIGHT ATRIUM           Index LA diam:        4.10 cm 2.74 cm/m  RA Area:     18.40 cm LA Vol (A2C):   65.6 ml 43.91 ml/m RA Volume:   48.90 ml  32.73 ml/m LA Vol (A4C):   45.3 ml 30.32 ml/m LA Biplane Vol: 55.8 ml 37.35 ml/m  AORTIC VALVE LVOT Vmax:   91.40 cm/s LVOT Vmean:  67.000 cm/s LVOT VTI:    0.186 m AI PHT:      296 msec  AORTA Ao Root diam: 2.80 cm MITRAL VALVE               TRICUSPID VALVE MV Area (PHT): 6.54 cm    TR Peak grad:   54.2 mmHg MV Decel Time: 116 msec    TR Vmax:        368.00 cm/s MV E velocity: 85.30 cm/s MV A velocity: 37.70  cm/s  SHUNTS MV E/A ratio:  2.26        Systemic VTI:  0.19 m                            Systemic Diam: 2.20 cm Eleonore Chiquito MD Electronically signed by Eleonore Chiquito MD Signature Date/Time: 03/09/2020/12:57:38 PM    Final    ECHO TEE  Result Date: 03/10/2020    TRANSESOPHOGEAL ECHO REPORT   Patient Name:   KENESHIA TENA Date of Exam: 03/10/2020 Medical Rec #:  [id number]     Height:       64.0 in Accession #:    [id number]    Weight:       106.3 lb Date of Birth:  [birth year]     BSA:          1.495 m Patient Age:  84 years      BP:           125/105 mmHg Patient Gender: F             HR:           88 bpm. Exam Location:  Inpatient Procedure: 3D Echo, Transesophageal Echo, Cardiac Doppler, Color Doppler and            Intracardiac Opacification Agent Indications:     R94.31 Abnormal EKG  History:         Patient has prior history of Echocardiogram examinations, most                  recent 03/09/2020. CHF, Previous Myocardial Infarction and CAD,                  Abnormal ECG, COPD, Arrythmias:Atrial Fibrillation,                  Signs/Symptoms:Dyspnea; Risk Factors:Former Smoker.  Sonographer:     Roseanna Rainbow RDCS Referring Phys:  [id number] PAULA V ROSS Diagnosing Phys: Jenkins Rouge MD PROCEDURE: After discussion of the risks and benefits of a TEE, an informed consent was obtained from the patient. The transesophogeal probe was passed without difficulty through the esophogus of the patient. Local oropharyngeal anesthetic was provided with Cetacaine. Sedation performed by different physician. The patient was monitored while under deep sedation. Anesthestetic sedation was provided intravenously by Anesthesiology: 110mg  of Propofol. The patient's vital signs; including heart rate, blood  pressure, and oxygen saturation; remained stable throughout the procedure. The patient developed no complications during the procedure. IMPRESSIONS  1. Global hypokinesis worse in inferior wall. Left ventricular ejection fraction, by  estimation, is 30 to 35%. The left ventricle has moderately decreased function. The left ventricle demonstrates global hypokinesis. The left ventricular internal cavity  size was moderately dilated. Left ventricular diastolic function could not be evaluated.  2. Right ventricular systolic function is moderately reduced. The right ventricular size is mildly enlarged.  3. Dense spontaneous contrast. 1 x 1 cm acute/chronic LAA thrombus confirmed on orthogonal views and with definity . Left atrial size was moderately dilated. A left atrial/left atrial appendage thrombus was detected.  4. Right atrial size was mildly dilated.  5. The mitral valve is degenerative. Mild mitral valve regurgitation. Moderate mitral annular calcification.  6. The aortic valve is tricuspid. Aortic valve regurgitation is not visualized. Mild to moderate aortic valve sclerosis/calcification is present, without any evidence of aortic stenosis.  7. Pulmonic valve regurgitation is moderate. FINDINGS  Left Ventricle: Global hypokinesis worse in inferior wall. Left ventricular ejection fraction, by estimation, is 30 to 35%. The left ventricle has moderately decreased function. The left ventricle demonstrates global hypokinesis. The left ventricular internal cavity size was moderately dilated. There is no left ventricular hypertrophy. Left ventricular diastolic function could not be evaluated. Right Ventricle: The right ventricular size is mildly enlarged. Right vetricular wall thickness was not assessed. Right ventricular systolic function is moderately reduced. Left Atrium: Dense spontaneous contrast. 1 x 1 cm acute/chronic LAA thrombus confirmed on orthogonal views and with definity. Left atrial size was moderately dilated. A left atrial/left atrial appendage thrombus was detected. Right Atrium: Right atrial size was mildly dilated. Pericardium: There is no evidence of pericardial effusion. Mitral Valve: The mitral valve is degenerative in  appearance. There is mild thickening of the mitral valve leaflet(s). There is mild calcification of the mitral valve leaflet(s). Moderate mitral annular calcification. Mild mitral valve  regurgitation. Tricuspid Valve: The tricuspid valve is normal in structure. Tricuspid valve regurgitation is mild. Aortic Valve: The aortic valve is tricuspid. Aortic valve regurgitation is not visualized. Mild to moderate aortic valve sclerosis/calcification is present, without any evidence of aortic stenosis. Pulmonic Valve: The pulmonic valve was normal in structure. Pulmonic valve regurgitation is moderate. Aorta: The aortic root is normal in size and structure. IAS/Shunts: No atrial level shunt detected by color flow Doppler. Jenkins Rouge MD Electronically signed by Jenkins Rouge MD Signature Date/Time: 03/10/2020/12:32:18 PM    Final     Cardiac Studies   Echo 03/10/19  1. Left ventricular ejection fraction, by estimation, is 35 to 40%. The left ventricle has moderately decreased function. The left ventricle demonstrates global hypokinesis. There is mild concentric left ventricular hypertrophy. Left ventricular diastolic function could not be evaluated. 2. Right ventricular systolic function is moderately reduced. The right ventricular size is mildly enlarged. There is severely elevated pulmonary artery systolic pressure. The estimated right ventricular systolic pressure is 35.3 mmHg. 3. Left atrial size was mild to moderately dilated. 4. Right atrial size was mildly dilated. 5. The mitral valve is grossly normal. Mild to moderate mitral valve regurgitation. No evidence of mitral stenosis. 6. The aortic valve is tricuspid. There is mild calcification of the aortic valve. There is mild thickening of the aortic valve. Aortic valve regurgitation is mild. Mild aortic valve sclerosis is present, with no evidence of aortic valve stenosis. 7. The inferior vena cava is dilated in size with <50% respiratory variability,  suggesting right atrial pressure of 15 mmHg.  Patient Profile     85 y.o. female with a PMH of CAD, chronic diastolic CHF, paroxysmal atrial fibrillation, HTN, HLD, and CKD stage 3, who was admitted to the hospital from clinic 03/08/20 after presenting with progressive DOE, orthopnea, and pedal edema, found to be in atrial fibrillation with RVR.   Assessment & Plan    1. Acute combined CHF: echo this admission with EF 35-40%, global hypokinesis, mild concentric LVH, indeterminate LV diastolic function, moderately reduced RV function with mild enlargement, severely elevated PA pressure, mild-moderate LAE, mild RAE, mild-moderate MR, mild AI, and mild TR. She was diuresed with IV lasix with UOP -1.5L in the past 24 hours and -2.9L this admission with at least one unmeasured urine output occurrence and incomplete intake documentation. Weight is 101lbs today from 107lb on admission.  - Continue metoprolol tartrate - anticipate transition to succinate prior to discharge.   2. Paroxysmal atrial fibrillation: persistent Afib this admission. She was started on apixaban 2.5mg  BID for stroke ppx. Rates improved with short term diltiazem gtt, now on metoprolol tartrate withrates generally stable in the 70s-100s. Plan was for TEE/DCCV, however she was found to have a LAA thrombus.  - Continue apixaban 2.5mg  BID  - Continue metoprolol tartrate for rate control with plans to transition to succinate prior to discharge - Consider DCCV after 3-4 weeks of uninterrupted anticoagulation if Afib persists  3. LAA thrombus: noted on TEE yesterday  - Continue apixaban  4. CAD s/p PTCA to D1 08/2019: no anginal complaints. No aspirin given need for anticoagulation - Continue BBlocker - Continue statin  5. HTN: BP up a bit today - Continue metoprolol and adjust as needed  6. HLD: LDL 154 08/2019, started on low dose crestor - Will repeat FLP in AM to monitor for improvement - Continue crestor  For questions or  updates, please contact [place] Please consult [email or web address] for contact info under  Signed, Abigail Butts, PA-C  03/11/2020, 9:27 AM    .med

## 2020-03-11 NOTE — Progress Notes (Signed)
Initial Nutrition Assessment  DOCUMENTATION CODES:   Underweight  INTERVENTION:   -Ensure Enlive po TID, each supplement provides 350 kcal and 20 grams of protein -MVI with minerals daily  NUTRITION DIAGNOSIS:   Inadequate oral intake related to decreased appetite as evidenced by meal completion < 50%.  GOAL:   Patient will meet greater than or equal to 90% of their needs  MONITOR:   PO intake,Supplement acceptance,Labs,Weight trends,Skin,I & O's  REASON FOR ASSESSMENT:   Other (Comment)    ASSESSMENT:   85 y.o. female with Hx of CAD, PAF admitted from clinic yesterday for Rx of CHF  Pt admitted with CHF.   1/5- s/p TEE- revealed thrombus  Reviewed I/O's: -1.5 L x 24 hours and -2.9 L since admission  UOP: 1.7 L x 24 hours  Pt unavailable at time of attempted contact. Unable to obtain further nutrition-related history or complete nutrition-focused physical exam at this time.   Per cardiology notes, will require anticoagulation for 3-4 weeks prior to cardioversion.   Pt with poor appetite. Noted meal completion 0-50%.   Reviewed wt hx; pt has experienced a 13.3% wt loss over the past 3 months, which is significant for time frame.   Pt is at high risk for malnutrition given underweight status, advanced age, multiple co-morbidties, and recent significant weight loss. However, unable to identify malnutrition at this time. Pt would greatly benefit from addition of oral nutrition supplements.   Medications reviewed and include colace and potassium chloride.   Labs reviewed: K: 3.2.   Diet Order:   Diet Order            Diet Heart Room service appropriate? Yes; Fluid consistency: Thin  Diet effective now                 EDUCATION NEEDS:   No education needs have been identified at this time  Skin:  Skin Assessment: Reviewed RN Assessment  Last BM:  03/08/20  Height:   Ht Readings from Last 1 Encounters:  03/10/20 5\' 4"  (1.626 m)    Weight:   Wt  Readings from Last 1 Encounters:  03/11/20 46 kg    Ideal Body Weight:  54.5 kg  BMI:  Body mass index is 17.41 kg/m.  Estimated Nutritional Needs:   Kcal:  1650-1850  Protein:  80-95 grams  Fluid:  > 1.6 L    [name], RD, LDN, [place] Registered Dietitian II Certified Diabetes Care and Education Specialist Please refer to [facility] for RD and/or RD on-call/weekend/after hours pager

## 2020-03-11 NOTE — Plan of Care (Signed)
  Problem: Clinical Measurements: Goal: Ability to maintain clinical measurements within normal limits will improve Outcome: Progressing   Problem: Clinical Measurements: Goal: Will remain free from infection Outcome: Progressing   

## 2020-03-11 NOTE — Progress Notes (Signed)
Pt with complaints of sore throat and difficulty swallowing. Paged provider on call [place]. Will continue to monitor pt.

## 2020-03-11 NOTE — Progress Notes (Signed)
Progress Note  Patient Name: [name] Date of Encounter: 03/11/2020  [facility] Cardiologist: Kirk Ruths, MD   Subjective   "Im short of breath"  Inpatient Medications    Scheduled Meds: . apixaban  2.5 mg Oral BID  . docusate sodium  100 mg Oral BID  . hydrocerin   Topical BID  . isosorbide mononitrate  30 mg Oral Daily  . metoprolol tartrate  50 mg Oral BID  . pantoprazole  40 mg Oral Daily  . potassium chloride  40 mEq Oral Daily  . rosuvastatin  10 mg Oral Daily   Continuous Infusions:  PRN Meds: acetaminophen, clonazePAM, hydrocortisone cream, hydrOXYzine, menthol-cetylpyridinium, nitroGLYCERIN, ondansetron (ZOFRAN) IV   Vital Signs    Vitals:   03/10/20 1930 03/10/20 2201 03/11/20 0309 03/11/20 0316  BP: (!) 144/90 (!) 161/97  (!) 149/98  Pulse:  85  (!) 109  Resp: 17   15  Temp: (!) 97 F (36.1 C) (!) 97.1 F (36.2 C)  98.1 F (36.7 C)  TempSrc: Oral Oral  Oral  SpO2: 95%   96%  Weight:   46 kg   Height:        Intake/Output Summary (Last 24 hours) at 03/11/2020 0831 Last data filed at 03/10/2020 1939 Gross per 24 hour  Intake 150 ml  Output 1650 ml  Net -1500 ml   Net neg 3L   Last 3 Weights 03/11/2020 03/10/2020 03/10/2020  Weight (lbs) 101 lb 6.4 oz 107 lb 106 lb 4.2 oz  Weight (kg) 45.995 kg 48.535 kg 48.2 kg      Telemetry    Afib 80 to 120s  - Personally Reviewed  ECG    No new  - Personally Reviewed  Physical Exam   GEN: Thin 85 yo in NAD  Neck: No JVD Cardiac: Irreg irreg   NO S3   Respiratory: Rhonchi  Rales at R base   GI: Soft, nontender, non-distended  MS: 1+ LE  edema; No deformity. Neuro:  Nonfocal  Psych: Normal affect   Labs    High Sensitivity Troponin:  No results for input(s): TROPONINIHS in the last 720 hours.    Chemistry Recent Labs  Lab 03/08/20 1900 03/09/20 0524 03/10/20 0236 03/11/20 0348  NA 139 137 136 135  K 4.7 2.9* 3.4* 3.2*  CL 89* 88* 88* 89*  CO2 37* 33* 38* 32  GLUCOSE 75 112*  90 80  BUN 36* 35* 25* 26*  CREATININE 1.86* 1.71* 1.60* 1.66*  CALCIUM 9.3 8.7* 8.6* 8.3*  PROT 6.1*  --   --   --   ALBUMIN 3.0*  --   --   --   AST 52*  --   --   --   ALT 36  --   --   --   ALKPHOS 59  --   --   --   BILITOT 0.9  --   --   --   GFRNONAA 27* 30* 32* 31*  ANIONGAP 13 16* 10 14     Hematology Recent Labs  Lab 03/08/20 1900 03/09/20 0524  WBC 10.5 10.7*  RBC 4.89 4.52  HGB 13.0 12.0  HCT 41.0 37.7  MCV 83.8 83.4  MCH 26.6 26.5  MCHC 31.7 31.8  RDW 16.0* 16.2*  PLT 412* 377    BNP Recent Labs  Lab 03/08/20 1900  BNP 3,154.1*     DDimer No results for input(s): DDIMER in the last 168 hours.   Radiology  ECHOCARDIOGRAM COMPLETE  Result Date: 03/09/2020    ECHOCARDIOGRAM REPORT   Patient Name:   [name] Date of Exam: 03/09/2020 Medical Rec #:  [id number]     Height:       64.0 in Accession #:    [id number]    Weight:       106.0 lb Date of Birth:  1935-06-16     BSA:          1.494 m Patient Age:    55 years      BP:           153/69 mmHg Patient Gender: F             HR:           93 bpm. Exam Location:  Inpatient Procedure: 2D Echo, Cardiac Doppler and Color Doppler Indications:    Atrial fibrillation  History:        Patient has prior history of Echocardiogram examinations, most                 recent 09/16/2019. CAD and Previous Myocardial Infarction, COPD,                 Arrythmias:Atrial Fibrillation, Signs/Symptoms:Shortness of                 Breath; Risk Factors:Hypertension and Dyslipidemia.  Sonographer:    Dustin Flock Referring Phys: [id number] [place]  1. Left ventricular ejection fraction, by estimation, is 35 to 40%. The left ventricle has moderately decreased function. The left ventricle demonstrates global hypokinesis. There is mild concentric left ventricular hypertrophy. Left ventricular diastolic function could not be evaluated.  2. Right ventricular systolic function is moderately reduced. The right ventricular  size is mildly enlarged. There is severely elevated pulmonary artery systolic pressure. The estimated right ventricular systolic pressure is 44.8 mmHg.  3. Left atrial size was mild to moderately dilated.  4. Right atrial size was mildly dilated.  5. The mitral valve is grossly normal. Mild to moderate mitral valve regurgitation. No evidence of mitral stenosis.  6. The aortic valve is tricuspid. There is mild calcification of the aortic valve. There is mild thickening of the aortic valve. Aortic valve regurgitation is mild. Mild aortic valve sclerosis is present, with no evidence of aortic valve stenosis.  7. The inferior vena cava is dilated in size with <50% respiratory variability, suggesting right atrial pressure of 15 mmHg. Comparison(s): Changes from prior study are noted. EF now moderately reduced 35-40%. Global HK. RVSP elevated ~69 mmHG. FINDINGS  Left Ventricle: Left ventricular ejection fraction, by estimation, is 35 to 40%. The left ventricle has moderately decreased function. The left ventricle demonstrates global hypokinesis. The left ventricular internal cavity size was normal in size. There is mild concentric left ventricular hypertrophy. Left ventricular diastolic function could not be evaluated due to atrial fibrillation. Left ventricular diastolic function could not be evaluated. Right Ventricle: The right ventricular size is mildly enlarged. No increase in right ventricular wall thickness. Right ventricular systolic function is moderately reduced. There is severely elevated pulmonary artery systolic pressure. The tricuspid regurgitant velocity is 3.68 m/s, and with an assumed right atrial pressure of 15 mmHg, the estimated right ventricular systolic pressure is 18.5 mmHg. Left Atrium: Left atrial size was mild to moderately dilated. Right Atrium: Right atrial size was mildly dilated. Pericardium: Trivial pericardial effusion is present. Mitral Valve: The mitral valve is grossly normal. Mild to  moderate mitral valve regurgitation. No  evidence of mitral valve stenosis. Tricuspid Valve: The tricuspid valve is grossly normal. Tricuspid valve regurgitation is mild . No evidence of tricuspid stenosis. Aortic Valve: The aortic valve is tricuspid. There is mild calcification of the aortic valve. There is mild thickening of the aortic valve. Aortic valve regurgitation is mild. Aortic regurgitation PHT measures 296 msec. Mild aortic valve sclerosis is present, with no evidence of aortic valve stenosis. Pulmonic Valve: The pulmonic valve was grossly normal. Pulmonic valve regurgitation is trivial. No evidence of pulmonic stenosis. Aorta: The aortic root is normal in size and structure. Venous: The inferior vena cava is dilated in size with less than 50% respiratory variability, suggesting right atrial pressure of 15 mmHg. IAS/Shunts: The atrial septum is grossly normal. Additional Comments: There is a small pleural effusion in the left lateral region.  LEFT VENTRICLE PLAX 2D LVIDd:         4.50 cm  Diastology LVIDs:         3.00 cm  LV e' medial:    4.03 cm/s LV PW:         1.30 cm  LV E/e' medial:  21.2 LV IVS:        1.20 cm  LV e' lateral:   8.59 cm/s LVOT diam:     2.20 cm  LV E/e' lateral: 9.9 LV SV:         71 LV SV Index:   47 LVOT Area:     3.80 cm  RIGHT VENTRICLE RV Basal diam:  2.30 cm RV S prime:     8.92 cm/s TAPSE (M-mode): 1.4 cm LEFT ATRIUM             Index       RIGHT ATRIUM           Index LA diam:        4.10 cm 2.74 cm/m  RA Area:     18.40 cm LA Vol (A2C):   65.6 ml 43.91 ml/m RA Volume:   48.90 ml  32.73 ml/m LA Vol (A4C):   45.3 ml 30.32 ml/m LA Biplane Vol: 55.8 ml 37.35 ml/m  AORTIC VALVE LVOT Vmax:   91.40 cm/s LVOT Vmean:  67.000 cm/s LVOT VTI:    0.186 m AI PHT:      296 msec  AORTA Ao Root diam: 2.80 cm MITRAL VALVE               TRICUSPID VALVE MV Area (PHT): 6.54 cm    TR Peak grad:   54.2 mmHg MV Decel Time: 116 msec    TR Vmax:        368.00 cm/s MV E velocity: 85.30 cm/s MV  A velocity: 37.70 cm/s  SHUNTS MV E/A ratio:  2.26        Systemic VTI:  0.19 m                            Systemic Diam: 2.20 cm Eleonore Chiquito MD Electronically signed by Eleonore Chiquito MD Signature Date/Time: 03/09/2020/12:57:38 PM    Final    ECHO TEE  Result Date: 03/10/2020    TRANSESOPHOGEAL ECHO REPORT   Patient Name:   WESTON FULCO Date of Exam: 03/10/2020 Medical Rec #:  [id number]     Height:       64.0 in Accession #:    [id number]    Weight:       106.3 lb Date of Birth:  1935/04/26     BSA:          1.495 m Patient Age:    10 years      BP:           125/105 mmHg Patient Gender: F             HR:           88 bpm. Exam Location:  Inpatient Procedure: 3D Echo, Transesophageal Echo, Cardiac Doppler, Color Doppler and            Intracardiac Opacification Agent Indications:     R94.31 Abnormal EKG  History:         Patient has prior history of Echocardiogram examinations, most                  recent 03/09/2020. CHF, Previous Myocardial Infarction and CAD,                  Abnormal ECG, COPD, Arrythmias:Atrial Fibrillation,                  Signs/Symptoms:Dyspnea; Risk Factors:Former Smoker.  Sonographer:     Roseanna Rainbow RDCS Referring Phys:  [id number] Shauntea Lok V Ramina Hulet Diagnosing Phys: Jenkins Rouge MD PROCEDURE: After discussion of the risks and benefits of a TEE, an informed consent was obtained from the patient. The transesophogeal probe was passed without difficulty through the esophogus of the patient. Local oropharyngeal anesthetic was provided with Cetacaine. Sedation performed by different physician. The patient was monitored while under deep sedation. Anesthestetic sedation was provided intravenously by Anesthesiology: 110mg  of Propofol. The patient's vital signs; including heart rate, blood  pressure, and oxygen saturation; remained stable throughout the procedure. The patient developed no complications during the procedure. IMPRESSIONS  1. Global hypokinesis worse in inferior wall. Left ventricular ejection  fraction, by estimation, is 30 to 35%. The left ventricle has moderately decreased function. The left ventricle demonstrates global hypokinesis. The left ventricular internal cavity  size was moderately dilated. Left ventricular diastolic function could not be evaluated.  2. Right ventricular systolic function is moderately reduced. The right ventricular size is mildly enlarged.  3. Dense spontaneous contrast. 1 x 1 cm acute/chronic LAA thrombus confirmed on orthogonal views and with definity . Left atrial size was moderately dilated. A left atrial/left atrial appendage thrombus was detected.  4. Right atrial size was mildly dilated.  5. The mitral valve is degenerative. Mild mitral valve regurgitation. Moderate mitral annular calcification.  6. The aortic valve is tricuspid. Aortic valve regurgitation is not visualized. Mild to moderate aortic valve sclerosis/calcification is present, without any evidence of aortic stenosis.  7. Pulmonic valve regurgitation is moderate. FINDINGS  Left Ventricle: Global hypokinesis worse in inferior wall. Left ventricular ejection fraction, by estimation, is 30 to 35%. The left ventricle has moderately decreased function. The left ventricle demonstrates global hypokinesis. The left ventricular internal cavity size was moderately dilated. There is no left ventricular hypertrophy. Left ventricular diastolic function could not be evaluated. Right Ventricle: The right ventricular size is mildly enlarged. Right vetricular wall thickness was not assessed. Right ventricular systolic function is moderately reduced. Left Atrium: Dense spontaneous contrast. 1 x 1 cm acute/chronic LAA thrombus confirmed on orthogonal views and with definity. Left atrial size was moderately dilated. A left atrial/left atrial appendage thrombus was detected. Right Atrium: Right atrial size was mildly dilated. Pericardium: There is no evidence of pericardial effusion. Mitral Valve: The mitral valve is  degenerative in appearance. There is mild  thickening of the mitral valve leaflet(s). There is mild calcification of the mitral valve leaflet(s). Moderate mitral annular calcification. Mild mitral valve regurgitation. Tricuspid Valve: The tricuspid valve is normal in structure. Tricuspid valve regurgitation is mild. Aortic Valve: The aortic valve is tricuspid. Aortic valve regurgitation is not visualized. Mild to moderate aortic valve sclerosis/calcification is present, without any evidence of aortic stenosis. Pulmonic Valve: The pulmonic valve was normal in structure. Pulmonic valve regurgitation is moderate. Aorta: The aortic root is normal in size and structure. IAS/Shunts: No atrial level shunt detected by color flow Doppler. Jenkins Rouge MD Electronically signed by Jenkins Rouge MD Signature Date/Time: 03/10/2020/12:32:18 PM    Final     Cardiac Studies   Echo 03/09/20  1. Left ventricular ejection fraction, by estimation, is 35 to 40%. The left ventricle has moderately decreased function. The left ventricle demonstrates global hypokinesis. There is mild concentric left ventricular hypertrophy. Left ventricular diastolic function could not be evaluated. 2. Right ventricular systolic function is moderately reduced. The right ventricular size is mildly enlarged. There is severely elevated pulmonary artery systolic pressure. The estimated right ventricular systolic pressure is 23.5 mmHg. 3. Left atrial size was mild to moderately dilated. 4. Right atrial size was mildly dilated. 5. The mitral valve is grossly normal. Mild to moderate mitral valve regurgitation. No evidence of mitral stenosis. 6. The aortic valve is tricuspid. There is mild calcification of the aortic valve. There is mild thickening of the aortic valve. Aortic valve regurgitation is mild. Mild aortic valve sclerosis is present, with no evidence of aortic valve stenosis. 7. The inferior vena cava is dilated in size with <50%  respiratory variability, suggesting right atrial pressure of 15 mmHg.  Patient Profile     85 y.o. female with Hx of CAD, PAF admitted from clinic yesterday for Rx of CHF    Assessment & Plan    1  Acute systolic CHF    Echo as noted above  LV function down   May be rate related    Has diuresed 2.9 L  Still with increaed volume on exam Will diurese some more   2  Atrial fibrillation   Felt contributing to problem 1  Unfortunately TEE yesterday showed LAA thrombus   Will need 3 to 4 wks anticoagulation prior to cardioversion   Plan for rate control   Needs better rate control   Will increase metoprolol  Dose at shorter intervals to titrate      3  HTN   BP is labile      4 CAD   Pt with MI this past summer with PTCA to diagonal    No symptoms of angina    5  HL  On statin   6   CKD  Cr 1. 66   Today   Follow     For questions or updates, please contact [facility] Please consult [email or web address] for contact info under        Signed, Dorris Carnes, MD  03/11/2020, 8:31 AM

## 2020-03-12 DIAGNOSIS — I5033 Acute on chronic diastolic (congestive) heart failure: Secondary | ICD-10-CM | POA: Diagnosis not present

## 2020-03-12 LAB — BASIC METABOLIC PANEL
Anion gap: 12 (ref 5–15)
BUN: 31 mg/dL — ABNORMAL HIGH (ref 8–23)
CO2: 32 mmol/L (ref 22–32)
Calcium: 8.1 mg/dL — ABNORMAL LOW (ref 8.9–10.3)
Chloride: 89 mmol/L — ABNORMAL LOW (ref 98–111)
Creatinine, Ser: 1.73 mg/dL — ABNORMAL HIGH (ref 0.44–1.00)
GFR, Estimated: 29 mL/min — ABNORMAL LOW (ref >60)
Glucose, Bld: 153 mg/dL — ABNORMAL HIGH (ref 70–99)
Potassium: 4 mmol/L (ref 3.5–5.1)
Sodium: 133 mmol/L — ABNORMAL LOW (ref 135–145)

## 2020-03-12 LAB — BRAIN NATRIURETIC PEPTIDE: B Natriuretic Peptide: 2943.6 pg/mL — ABNORMAL HIGH (ref 0.0–100.0)

## 2020-03-12 MED ORDER — DILTIAZEM HCL 30 MG PO TABS
30.0000 mg | ORAL_TABLET | Freq: Four times a day (QID) | ORAL | Status: DC
  Administered 2020-03-12 – 2020-03-14 (×8): 30 mg via ORAL
  Filled 2020-03-12 (×8): qty 1

## 2020-03-12 MED ORDER — PHENOL 1.4 % MT LIQD: 1.0000 | OROMUCOSAL | Status: DC | PRN

## 2020-03-12 NOTE — Progress Notes (Signed)
Pt with complaint of sob with exertion. Pt states her throat feels like it isn't open and she has now developed a cough. Paged provider on call. Awaiting further orders. Pt oxygen saturation at 93% on 3l [place]

## 2020-03-12 NOTE — Progress Notes (Addendum)
Progress Note  Patient Name: JANIELLE MITTELSTADT Date of Encounter: 03/12/2020  Primary Cardiologist: Kirk Ruths, MD   Subjective   Very frustrated this morning. Attempting to call "her lawyer" is we don't let her leave.   Inpatient Medications    Scheduled Meds: . apixaban  2.5 mg Oral BID  . docusate sodium  100 mg Oral BID  . feeding supplement  237 mL Oral TID BM  . hydrocerin   Topical BID  . isosorbide mononitrate  30 mg Oral Daily  . metoprolol tartrate  50 mg Oral QID  . multivitamin with minerals  1 tablet Oral Daily  . pantoprazole  40 mg Oral Daily  . potassium chloride  40 mEq Oral Daily  . rosuvastatin  10 mg Oral Daily   Continuous Infusions:  PRN Meds: acetaminophen, clonazePAM, hydrocortisone cream, hydrOXYzine, menthol-cetylpyridinium, nitroGLYCERIN, ondansetron (ZOFRAN) IV   Vital Signs    Vitals:   03/11/20 1440 03/11/20 2026 03/12/20 0600 03/12/20 0645  BP: 136/89 124/85  (!) 144/96  Pulse: 99 95    Resp:  18  20  Temp:  98.3 F (36.8 C)  98.2 F (36.8 C)  TempSrc:  Oral  Oral  SpO2:  98%  98%  Weight:   46 kg   Height:        Intake/Output Summary (Last 24 hours) at 03/12/2020 0754 Last data filed at 03/11/2020 2135 Gross per 24 hour  Intake 480 ml  Output 1150 ml  Net -670 ml   Filed Weights   03/10/20 1124 03/11/20 0309 03/12/20 0600  Weight: 48.5 kg 46 kg 46 kg    Physical Exam   General: Elderly, NAD Neck: Negative for carotid bruits. No JVD Lungs:Clear to ausculation bilaterally. No wheezes, rales, or rhonchi. Breathing is unlabored. Cardiovascular: Irregularly irregular. No murmurs Abdomen: Soft, non-tender, non-distended. No obvious abdominal masses. Extremities: No edema. Radial pulses 2+ bilaterally Neuro: Alert and oriented. No focal deficits. No facial asymmetry. MAE spontaneously. Psych: Responds to questions appropriately with normal affect.    Labs    Chemistry Recent Labs  Lab 03/08/20 1900 03/09/20 0524  03/10/20 0236 03/11/20 0348  NA 139 137 136 135  K 4.7 2.9* 3.4* 3.2*  CL 89* 88* 88* 89*  CO2 37* 33* 38* 32  GLUCOSE 75 112* 90 80  BUN 36* 35* 25* 26*  CREATININE 1.86* 1.71* 1.60* 1.66*  CALCIUM 9.3 8.7* 8.6* 8.3*  PROT 6.1*  --   --   --   ALBUMIN 3.0*  --   --   --   AST 52*  --   --   --   ALT 36  --   --   --   ALKPHOS 59  --   --   --   BILITOT 0.9  --   --   --   GFRNONAA 27* 30* 32* 31*  ANIONGAP 13 16* 10 14     Hematology Recent Labs  Lab 03/08/20 1900 03/09/20 0524  WBC 10.5 10.7*  RBC 4.89 4.52  HGB 13.0 12.0  HCT 41.0 37.7  MCV 83.8 83.4  MCH 26.6 26.5  MCHC 31.7 31.8  RDW 16.0* 16.2*  PLT 412* 377    Cardiac EnzymesNo results for input(s): TROPONINI in the last 168 hours. No results for input(s): TROPIPOC in the last 168 hours.   BNP Recent Labs  Lab 03/08/20 1900  BNP 3,154.1*     DDimer No results for input(s): DDIMER in the last  168 hours.   Radiology    ECHO TEE  Result Date: 03/10/2020    TRANSESOPHOGEAL ECHO REPORT   Patient Name:   GENAE STRINE Date of Exam: 03/10/2020 Medical Rec #:  [id number]     Height:       64.0 in Accession #:    [id number]    Weight:       106.3 lb Date of Birth:  1935-06-12     BSA:          1.495 m Patient Age:    26 years      BP:           125/105 mmHg Patient Gender: F             HR:           88 bpm. Exam Location:  Inpatient Procedure: 3D Echo, Transesophageal Echo, Cardiac Doppler, Color Doppler and            Intracardiac Opacification Agent Indications:     R94.31 Abnormal EKG  History:         Patient has prior history of Echocardiogram examinations, most                  recent 03/09/2020. CHF, Previous Myocardial Infarction and CAD,                  Abnormal ECG, COPD, Arrythmias:Atrial Fibrillation,                  Signs/Symptoms:Dyspnea; Risk Factors:Former Smoker.  Sonographer:     Roseanna Rainbow RDCS Referring Phys:  [id number] Jamont Mellin V Lindalou Soltis Diagnosing Phys: Jenkins Rouge MD PROCEDURE: After discussion of the risks  and benefits of a TEE, an informed consent was obtained from the patient. The transesophogeal probe was passed without difficulty through the esophogus of the patient. Local oropharyngeal anesthetic was provided with Cetacaine. Sedation performed by different physician. The patient was monitored while under deep sedation. Anesthestetic sedation was provided intravenously by Anesthesiology: 110mg  of Propofol. The patient's vital signs; including heart rate, blood  pressure, and oxygen saturation; remained stable throughout the procedure. The patient developed no complications during the procedure. IMPRESSIONS  1. Global hypokinesis worse in inferior wall. Left ventricular ejection fraction, by estimation, is 30 to 35%. The left ventricle has moderately decreased function. The left ventricle demonstrates global hypokinesis. The left ventricular internal cavity  size was moderately dilated. Left ventricular diastolic function could not be evaluated.  2. Right ventricular systolic function is moderately reduced. The right ventricular size is mildly enlarged.  3. Dense spontaneous contrast. 1 x 1 cm acute/chronic LAA thrombus confirmed on orthogonal views and with definity . Left atrial size was moderately dilated. A left atrial/left atrial appendage thrombus was detected.  4. Right atrial size was mildly dilated.  5. The mitral valve is degenerative. Mild mitral valve regurgitation. Moderate mitral annular calcification.  6. The aortic valve is tricuspid. Aortic valve regurgitation is not visualized. Mild to moderate aortic valve sclerosis/calcification is present, without any evidence of aortic stenosis.  7. Pulmonic valve regurgitation is moderate. FINDINGS  Left Ventricle: Global hypokinesis worse in inferior wall. Left ventricular ejection fraction, by estimation, is 30 to 35%. The left ventricle has moderately decreased function. The left ventricle demonstrates global hypokinesis. The left ventricular internal  cavity size was moderately dilated. There is no left ventricular hypertrophy. Left ventricular diastolic function could not be evaluated. Right Ventricle: The right ventricular size is mildly enlarged. Right vetricular  wall thickness was not assessed. Right ventricular systolic function is moderately reduced. Left Atrium: Dense spontaneous contrast. 1 x 1 cm acute/chronic LAA thrombus confirmed on orthogonal views and with definity. Left atrial size was moderately dilated. A left atrial/left atrial appendage thrombus was detected. Right Atrium: Right atrial size was mildly dilated. Pericardium: There is no evidence of pericardial effusion. Mitral Valve: The mitral valve is degenerative in appearance. There is mild thickening of the mitral valve leaflet(s). There is mild calcification of the mitral valve leaflet(s). Moderate mitral annular calcification. Mild mitral valve regurgitation. Tricuspid Valve: The tricuspid valve is normal in structure. Tricuspid valve regurgitation is mild. Aortic Valve: The aortic valve is tricuspid. Aortic valve regurgitation is not visualized. Mild to moderate aortic valve sclerosis/calcification is present, without any evidence of aortic stenosis. Pulmonic Valve: The pulmonic valve was normal in structure. Pulmonic valve regurgitation is moderate. Aorta: The aortic root is normal in size and structure. IAS/Shunts: No atrial level shunt detected by color flow Doppler. Jenkins Rouge MD Electronically signed by Jenkins Rouge MD Signature Date/Time: 03/10/2020/12:32:18 PM    Final    Telemetry    03/12/20 AF with rates in the 100's - Personally Reviewed  ECG    No new tracing as of 03/12/20 - Personally Reviewed  Cardiac Studies   Echo 03/09/20  1. Left ventricular ejection fraction, by estimation, is 35 to 40%. The left ventricle has moderately decreased function. The left ventricle demonstrates global hypokinesis. There is mild concentric left ventricular hypertrophy. Left  ventricular diastolic function could not be evaluated. 2. Right ventricular systolic function is moderately reduced. The right ventricular size is mildly enlarged. There is severely elevated pulmonary artery systolic pressure. The estimated right ventricular systolic pressure is 86.5 mmHg. 3. Left atrial size was mild to moderately dilated. 4. Right atrial size was mildly dilated. 5. The mitral valve is grossly normal. Mild to moderate mitral valve regurgitation. No evidence of mitral stenosis. 6. The aortic valve is tricuspid. There is mild calcification of the aortic valve. There is mild thickening of the aortic valve. Aortic valve regurgitation is mild. Mild aortic valve sclerosis is present, with no evidence of aortic valve stenosis. 7. The inferior vena cava is dilated in size with <50% respiratory variability, suggesting right atrial pressure of 15 mmHg.  Patient Profile     85 y.o. female with hx of CAD, chronic diastolic CHF, paroxysmal atrial fibrillation, HTN, HLD, and CKD stage 3, who was admitted to the hospital from clinic 03/08/20 after presenting with progressive DOE, orthopnea, and pedal edema, found to be in atrial fibrillation with RVR.   Assessment & Plan    1. Acute systolic CHF: -Echocardiogram performed 03/09/20 with EF 35-40%, global hypokinesis, mild concentric LVH, indeterminate LV diastolic function, moderately reduced RV function with mild enlargement, severely elevated PA pressure, mild-moderate LAE, mild RAE, mild-moderate MR, mild AI, and mild TR -Plan to continue diureses  Get BMET today   -Weight, 101lb with an admission weight at 107lb  -I&O, net negative 3.6L -Creatinine, 1.66 on 03/11/20   2. Persistent atrial fibrillation with LAA thrombus per TEE: -She was started on apixaban 2.5mg  BID for stroke ppx. Rates improved with short term diltiazem gtt, now on metoprolol tartrate with rates generally stable in the 70s-100s.  -Plan was for TEE/DCCV, however she  was found to have a LAA thrombus.  -Plan for apixaban 2.5mg  BID and metoprolol tartrate for rate control with plans to transition to succinate prior to discharge -Plan DCCV after 3-4  weeks of uninterrupted anticoagulation if Afib persistsrate control at this time   3. HTN: -Stable, 144/96>>124/85>>136/89 -Continue beta blocker   4. CAD: -s/p PTCA to Diag 08/2019 -No reports of chest pain or angina  -No ASA in the setting of AC   5. HLD: -Last LDL, 154    08/2019 -Continue statin therapy  REcheck LDL today    6. CKD stage III: -Creatinine, 1.66 on 03/11/20 -Baseline appears to be in the 1.3-1.7 range  Will recheck BMET and BNP today    Signed, Kathyrn Drown NP-C [place] Pager: [phone] 03/12/2020, 7:54 AM    Pt seen and examined   I agree wit hfindings as noted above by [place] Pt complaining of sore throat  ALso upset that she did not get help with bathroom   Had to clean herself up  On exam Lungs:  Decreased airflow   Rales at bases Cardiac Irreg Irreg   No S3 Ext are without edema  Tele with afib around 100 at rest  Recomm:  Persistent atrial fibrillation   Rates still up despite 50 q 6 hours of metoprolol WIll add low dose dilt to see if helps rates (even with LV function being down a little, need better rate control.  Dilt may have a better effect) Continue anticoagulation Plan for cardioversion in 3 to 4 wks   CHF  Volume difficult  I think she still has some extra fluid   She has significant COPD so no reserve   WIll get BMET and BNP  COnsider additional lasix   Ambulate later today and follow rates     Pt lives at home alone  Dorris Carnes MD   For questions or updates, please contact   Please consult [email or web address] for contact info under Cardiology/STEMI.

## 2020-03-12 NOTE — Care Management Important Message (Signed)
Important Message  Patient Details  Name: [name] MRN: [id number] Date of Birth: May 29, 1935   Medicare Important Message Given:  Yes     Shelda Altes 03/12/2020, 10:52 AM

## 2020-03-12 NOTE — Progress Notes (Signed)
Pt with complaint of sob and chest pain rated 5/10. Paged providers, vs were taken (see flowsheets), and EKG was done. Pt was assessed. Awaiting further orders. Pain has sense resolved and pt thinks is anxiety related. Will notify provider and continue to monitor pt.

## 2020-03-12 NOTE — Progress Notes (Signed)
  Mobility Specialist Criteria Algorithm Info.   Mobility Team:  [facility] elevated:Self regulated Activity: Ambulated in hall; Dangled on edge of bed; Ambulated to bathroom Range of motion: Active; All extremities Level of assistance: Contact guard assist, steadying assist Assistive device: None; Other (Comment) (Hand rails in hallway) Minutes sitting in chair:  Minutes stood: 7 minutes Minutes ambulated: 7 minutes Distance ambulated (ft): 280 ft Mobility response: Tolerated well Bed Position: Semi-fowlers  Patient received ambulating to bathroom with nursing, eager to participate in mobility when finished. She explained that PTA her sister assists her as needed otherwise she is very active and completely independent w/ADL'S. After using restroom she ambulated in hallway 240 feet at min guard due to slight instability. Declined RW but did use railing in hallway to steady. Completed thorough education on energy conservation and slower walking pace as she fatigues quickly. Upon completion of ambulation pt declined sitting in recliner but agreed to dangle EOB. Tolerated ambulation well without incident and complained of SOB that subsided quickly with rest. She is now sitting EOB with all needs met.  03/12/2020 11:57 AM

## 2020-03-12 NOTE — Plan of Care (Signed)
  Problem: Clinical Measurements: Goal: Ability to maintain clinical measurements within normal limits will improve Outcome: Progressing   Problem: Clinical Measurements: Goal: Will remain free from infection Outcome: Progressing   

## 2020-03-12 NOTE — TOC Progression Note (Addendum)
Transition of Care [facility]) - Progression Note    Patient Details  Name: [name] MRN: [id number] Date of Birth: [birth year]  Transition of [facility]) CM/SW Contact  Zenon Mayo, RN Phone Number: 03/12/2020, 11:33 PM  Clinical Narrative:    Patient is from home, has left thrombus, diuresing, s/o sob, she will be on eliquis, will have to check with pharmacy where script will be sent for price of eliquis if patient is dc over weekend.  She will need the 30 day coupon also.        Expected Discharge Plan and Services                                                 Social Determinants of Health (SDOH) Interventions    Readmission Risk Interventions Readmission Risk Prevention Plan 08/28/2019  Transportation Screening Complete  PCP or Specialist Appt within 5-7 Days Complete  Home Care Screening Complete  Medication Review (RN CM) Complete  Some recent data might be hidden

## 2020-03-13 DIAGNOSIS — I2510 Atherosclerotic heart disease of native coronary artery without angina pectoris: Secondary | ICD-10-CM | POA: Diagnosis not present

## 2020-03-13 DIAGNOSIS — I10 Essential (primary) hypertension: Secondary | ICD-10-CM

## 2020-03-13 DIAGNOSIS — N183 Chronic kidney disease, stage 3 unspecified: Secondary | ICD-10-CM

## 2020-03-13 DIAGNOSIS — I4819 Other persistent atrial fibrillation: Secondary | ICD-10-CM

## 2020-03-13 DIAGNOSIS — E785 Hyperlipidemia, unspecified: Secondary | ICD-10-CM

## 2020-03-13 DIAGNOSIS — I5021 Acute systolic (congestive) heart failure: Secondary | ICD-10-CM | POA: Diagnosis not present

## 2020-03-13 LAB — BASIC METABOLIC PANEL
Anion gap: 12 (ref 5–15)
BUN: 29 mg/dL — ABNORMAL HIGH (ref 8–23)
CO2: 32 mmol/L (ref 22–32)
Calcium: 8.7 mg/dL — ABNORMAL LOW (ref 8.9–10.3)
Chloride: 90 mmol/L — ABNORMAL LOW (ref 98–111)
Creatinine, Ser: 1.7 mg/dL — ABNORMAL HIGH (ref 0.44–1.00)
GFR, Estimated: 30 mL/min — ABNORMAL LOW (ref >60)
Glucose, Bld: 88 mg/dL (ref 70–99)
Potassium: 3.9 mmol/L (ref 3.5–5.1)
Sodium: 134 mmol/L — ABNORMAL LOW (ref 135–145)

## 2020-03-13 LAB — BRAIN NATRIURETIC PEPTIDE: B Natriuretic Peptide: 3757.7 pg/mL — ABNORMAL HIGH (ref 0.0–100.0)

## 2020-03-13 MED ORDER — FUROSEMIDE 10 MG/ML IJ SOLN
80.0000 mg | Freq: Two times a day (BID) | INTRAMUSCULAR | Status: DC
  Administered 2020-03-13: 80 mg via INTRAVENOUS
  Filled 2020-03-13: qty 8

## 2020-03-13 NOTE — Progress Notes (Signed)
Progress Note  Patient Name: [name] Date of Encounter: 03/13/2020  Primary Cardiologist: Kirk Ruths, MD   Subjective   Breathing OK in bed   "Look what Im coughing up"   NO CP   Inpatient Medications    Scheduled Meds: . apixaban  2.5 mg Oral BID  . diltiazem  30 mg Oral Q6H  . docusate sodium  100 mg Oral BID  . feeding supplement  237 mL Oral TID BM  . hydrocerin   Topical BID  . isosorbide mononitrate  30 mg Oral Daily  . metoprolol tartrate  50 mg Oral QID  . multivitamin with minerals  1 tablet Oral Daily  . pantoprazole  40 mg Oral Daily  . potassium chloride  40 mEq Oral Daily  . rosuvastatin  10 mg Oral Daily   Continuous Infusions:  PRN Meds: acetaminophen, clonazePAM, hydrocortisone cream, hydrOXYzine, menthol-cetylpyridinium, nitroGLYCERIN, ondansetron (ZOFRAN) IV, phenol   Vital Signs    Vitals:   03/12/20 1457 03/12/20 1830 03/12/20 1944 03/13/20 0016  BP: 123/85 126/72 134/73 (!) 145/74  Pulse: 78 80 (!) 108 85  Resp: 16  20   Temp: 97.7 F (36.5 C)  99.8 F (37.7 C)   TempSrc: Oral  Oral   SpO2: 95%  99% 95%  Weight:      Height:        Intake/Output Summary (Last 24 hours) at 03/13/2020 0550 Last data filed at 03/12/2020 1750 Gross per 24 hour  Intake 750 ml  Output 403 ml  Net 347 ml   Filed Weights   03/10/20 1124 03/11/20 0309 03/12/20 0600  Weight: 48.5 kg 46 kg 46 kg    Physical Exam   General:  Thin elderly, NAD Neck: . No JVD Lungs:Clear to ausculation bilaterally\ Cardiovascular: Irregularly irregular. No murmurs Abdomen: Soft, non-tender, non-distended. No obvious abdominal masses. Extremities: No edema. Radial pulses 2+ bilaterallyt.    Labs    Chemistry Recent Labs  Lab 03/08/20 1900 03/09/20 0524 03/10/20 0236 03/11/20 0348 03/12/20 1012  NA 139   < > 136 135 133*  K 4.7   < > 3.4* 3.2* 4.0  CL 89*   < > 88* 89* 89*  CO2 37*   < > 38* 32 32  GLUCOSE 75   < > 90 80 153*  BUN 36*   < > 25* 26*  31*  CREATININE 1.86*   < > 1.60* 1.66* 1.73*  CALCIUM 9.3   < > 8.6* 8.3* 8.1*  PROT 6.1*  --   --   --   --   ALBUMIN 3.0*  --   --   --   --   AST 52*  --   --   --   --   ALT 36  --   --   --   --   ALKPHOS 59  --   --   --   --   BILITOT 0.9  --   --   --   --   GFRNONAA 27*   < > 32* 31* 29*  ANIONGAP 13   < > 10 14 12    < > = values in this interval not displayed.     Hematology Recent Labs  Lab 03/08/20 1900 03/09/20 0524  WBC 10.5 10.7*  RBC 4.89 4.52  HGB 13.0 12.0  HCT 41.0 37.7  MCV 83.8 83.4  MCH 26.6 26.5  MCHC 31.7 31.8  RDW 16.0* 16.2*  PLT  412* 377    Cardiac EnzymesNo results for input(s): TROPONINI in the last 168 hours. No results for input(s): TROPIPOC in the last 168 hours.   BNP Recent Labs  Lab 03/08/20 1900 03/12/20 1012  BNP 3,154.1* 2,943.6*     DDimer No results for input(s): DDIMER in the last 168 hours.   Radiology    No results found. Telemetry    Afib 80s - Personally Reviewed  ECG    No new Personally Reviewed  Cardiac Studies   Echo 03/09/20  1. Left ventricular ejection fraction, by estimation, is 35 to 40%. The left ventricle has moderately decreased function. The left ventricle demonstrates global hypokinesis. There is mild concentric left ventricular hypertrophy. Left ventricular diastolic function could not be evaluated. 2. Right ventricular systolic function is moderately reduced. The right ventricular size is mildly enlarged. There is severely elevated pulmonary artery systolic pressure. The estimated right ventricular systolic pressure is 17.4 mmHg. 3. Left atrial size was mild to moderately dilated. 4. Right atrial size was mildly dilated. 5. The mitral valve is grossly normal. Mild to moderate mitral valve regurgitation. No evidence of mitral stenosis. 6. The aortic valve is tricuspid. There is mild calcification of the aortic valve. There is mild thickening of the aortic valve. Aortic valve regurgitation  is mild. Mild aortic valve sclerosis is present, with no evidence of aortic valve stenosis. 7. The inferior vena cava is dilated in size with <50% respiratory variability, suggesting right atrial pressure of 15 mmHg.  Patient Profile     85 y.o. female with hx of CAD, chronic diastolic CHF, paroxysmal atrial fibrillation, HTN, HLD, and CKD stage 3, who was admitted to the hospital from clinic 03/08/20 after presenting with progressive DOE, orthopnea, and pedal edema, found to be in atrial fibrillation with RVR.   Assessment & Plan    1. Acute systolic CHF:  May be tachy induced   -Echocardiogram performed 03/09/20 with EF 35-40%, global hypokinesis, mild concentric LVH, indeterminate LV diastolic function, moderately reduced RV function with mild enlargement, severely elevated PA pressure, mild-moderate LAE, mild RAE, mild-moderate MR, mild AI, and mild TR -Pt has diuresed 3.3 L   Volume does not appear bad On metoprolol and imdur   Not on hydralazine given Cr and need for rate control Alos on low dose dilt which was started for rate control   WOuld keep at current dosing since has helped with rate     2. Persistent atrial fibrillation with LAA thrombus per TEE: -She was started on apixaban 2.5mg  BID for stroke ppx. Rates improved with short term diltiazem gtt, now on metoprolol tartrate with rates generally stable in the 70s-100s.  -Plan was for TEE/DCCV, however she was found to have a LAA thrombus.  -Plan for Rx with apixaban 2.5mg  BID and metoprolol tartrate for rate control   With addition  Of low dose diltiazem rates are improved   -Plan DCCV after 3-4 weeks of uninterrupted anticoagulation   3. HTN: -Fair control  -Continue beta blocker   4. CAD: -s/p PTCA to Diag 08/2019 -No reports of chest pain or angina  -No ASA in the setting of AC   5. HLD: -Last LDL, 154    08/2019 -Continue statin therapy  REcheck LDL today    6. CKD stage III: -Creatinine, 1.73  Yesterday  Repeat  today    Patient should ambulate today to determine if needs oxygen    May need additional diuresis.   Course has been slow.  Dorris Carnes MD

## 2020-03-13 NOTE — Progress Notes (Signed)
Current vitals are as follows:   03/13/20 0915  Vitals  Temp 98.2 F (36.8 C)  Temp Source Oral  BP (!) 116/55  MAP (mmHg) 70  BP Location Right Arm  BP Method Automatic  Patient Position (if appropriate) Sitting  Pulse Rate 76  Pulse Rate Source Dinamap  ECG Heart Rate 75  Resp 14  Level of Consciousness  Level of Consciousness Alert  MEWS COLOR  MEWS Score Color Green  Oxygen Therapy  SpO2 99 %  O2 Device Nasal Cannula  O2 Flow Rate (L/min) 3 L/min  MEWS Score  MEWS Temp 0  MEWS Systolic 0  MEWS Pulse 0  MEWS RR 0  MEWS LOC 0  MEWS Score 0   Per Dorene Ar, NP, okay to give morning scheduled doses of metoprolol and ispsorbide mononitrate. Orders were carried out.

## 2020-03-14 ENCOUNTER — Encounter (HOSPITAL_COMMUNITY): Payer: Self-pay | Admitting: Cardiology

## 2020-03-14 DIAGNOSIS — I513 Intracardiac thrombosis, not elsewhere classified: Secondary | ICD-10-CM

## 2020-03-14 DIAGNOSIS — R0902 Hypoxemia: Secondary | ICD-10-CM | POA: Diagnosis present

## 2020-03-14 DIAGNOSIS — I4819 Other persistent atrial fibrillation: Secondary | ICD-10-CM | POA: Diagnosis not present

## 2020-03-14 DIAGNOSIS — I10 Essential (primary) hypertension: Secondary | ICD-10-CM | POA: Diagnosis not present

## 2020-03-14 DIAGNOSIS — I5021 Acute systolic (congestive) heart failure: Secondary | ICD-10-CM | POA: Diagnosis not present

## 2020-03-14 DIAGNOSIS — I2510 Atherosclerotic heart disease of native coronary artery without angina pectoris: Secondary | ICD-10-CM | POA: Diagnosis not present

## 2020-03-14 HISTORY — DX: Hypoxemia: R09.02

## 2020-03-14 HISTORY — DX: Intracardiac thrombosis, not elsewhere classified: I51.3

## 2020-03-14 LAB — BASIC METABOLIC PANEL
Anion gap: 13 (ref 5–15)
BUN: 31 mg/dL — ABNORMAL HIGH (ref 8–23)
CO2: 28 mmol/L (ref 22–32)
Calcium: 8.6 mg/dL — ABNORMAL LOW (ref 8.9–10.3)
Chloride: 89 mmol/L — ABNORMAL LOW (ref 98–111)
Creatinine, Ser: 1.58 mg/dL — ABNORMAL HIGH (ref 0.44–1.00)
GFR, Estimated: 32 mL/min — ABNORMAL LOW (ref >60)
Glucose, Bld: 115 mg/dL — ABNORMAL HIGH (ref 70–99)
Potassium: 4.6 mmol/L (ref 3.5–5.1)
Sodium: 130 mmol/L — ABNORMAL LOW (ref 135–145)

## 2020-03-14 MED ORDER — PANTOPRAZOLE SODIUM 40 MG PO TBEC: 40.0000 mg | DELAYED_RELEASE_TABLET | Freq: Every day | ORAL | Status: DC

## 2020-03-14 MED ORDER — DILTIAZEM HCL ER COATED BEADS 120 MG PO CP24: 120.0000 mg | ORAL_CAPSULE | Freq: Every day | ORAL | 6 refills | Status: DC

## 2020-03-14 MED ORDER — POTASSIUM CHLORIDE CRYS ER 10 MEQ PO TBCR: 10.0000 meq | EXTENDED_RELEASE_TABLET | Freq: Every day | ORAL | 6 refills | Status: DC

## 2020-03-14 MED ORDER — DOCUSATE SODIUM 100 MG PO CAPS: 100.0000 mg | ORAL_CAPSULE | Freq: Every day | ORAL | Status: AC | PRN

## 2020-03-14 MED ORDER — DILTIAZEM HCL ER COATED BEADS 120 MG PO CP24
120.0000 mg | ORAL_CAPSULE | Freq: Every day | ORAL | Status: DC
  Administered 2020-03-14: 120 mg via ORAL
  Filled 2020-03-14: qty 1

## 2020-03-14 MED ORDER — METOPROLOL TARTRATE 100 MG PO TABS
100.0000 mg | ORAL_TABLET | Freq: Two times a day (BID) | ORAL | Status: DC
  Administered 2020-03-14: 100 mg via ORAL
  Filled 2020-03-14: qty 1

## 2020-03-14 MED ORDER — METOPROLOL TARTRATE 100 MG PO TABS: 100.0000 mg | ORAL_TABLET | Freq: Two times a day (BID) | ORAL | 6 refills | Status: DC

## 2020-03-14 MED ORDER — ACETAMINOPHEN 325 MG PO TABS: 650.0000 mg | ORAL_TABLET | ORAL | Status: DC | PRN

## 2020-03-14 MED ORDER — ROSUVASTATIN CALCIUM 10 MG PO TABS: 10.0000 mg | ORAL_TABLET | Freq: Every day | ORAL | 3 refills | Status: DC

## 2020-03-14 MED ORDER — APIXABAN 2.5 MG PO TABS: 2.5000 mg | ORAL_TABLET | Freq: Two times a day (BID) | ORAL | 6 refills | Status: DC

## 2020-03-14 MED ORDER — FUROSEMIDE 10 MG/ML IJ SOLN
80.0000 mg | Freq: Once | INTRAMUSCULAR | Status: AC
  Administered 2020-03-14: 80 mg via INTRAVENOUS
  Filled 2020-03-14: qty 8

## 2020-03-14 MED ORDER — CLONAZEPAM 0.5 MG PO TABS: 0.5000 mg | ORAL_TABLET | Freq: Every evening | ORAL | Status: AC | PRN

## 2020-03-14 MED ORDER — FUROSEMIDE 80 MG PO TABS: 80.0000 mg | ORAL_TABLET | Freq: Every day | ORAL | Status: DC

## 2020-03-14 MED ORDER — HYDROCERIN EX CREA: 1.0000 "application " | TOPICAL_CREAM | Freq: Two times a day (BID) | CUTANEOUS | 0 refills | Status: DC

## 2020-03-14 MED ORDER — FUROSEMIDE 80 MG PO TABS: 80.0000 mg | ORAL_TABLET | Freq: Every day | ORAL | 6 refills | Status: DC

## 2020-03-14 MED ORDER — ISOSORBIDE MONONITRATE ER 30 MG PO TB24: 30.0000 mg | ORAL_TABLET | Freq: Every day | ORAL | 6 refills | Status: DC

## 2020-03-14 MED ORDER — POTASSIUM CHLORIDE CRYS ER 10 MEQ PO TBCR
10.0000 meq | EXTENDED_RELEASE_TABLET | Freq: Every day | ORAL | Status: DC
  Administered 2020-03-14: 10 meq via ORAL
  Filled 2020-03-14: qty 1

## 2020-03-14 MED ORDER — METOLAZONE 2.5 MG PO TABS: 2.5000 mg | ORAL_TABLET | Freq: Once | ORAL | Status: DC

## 2020-03-14 NOTE — Discharge Instructions (Signed)
If you have scales, weigh daily and if weight up 3 pounds in a day or 5 pounds in a week, call Dr. Jacalyn Lefevre office. Low salt diet to prevent fluid from accumulating.   Call Dr. Jacalyn Lefevre office for any questions or issues      Information on my medicine - ELIQUIS (apixaban)  This medication education was reviewed with me or my healthcare representative as part of my discharge preparation  Why was Eliquis prescribed for you? Eliquis was prescribed for you to reduce the risk of a blood clot forming that can cause a stroke if you have a medical condition called atrial fibrillation (a type of irregular heartbeat).  What do You need to know about Eliquis ? Take your Eliquis TWICE DAILY - one tablet in the morning and one tablet in the evening with or without food. If you have difficulty swallowing the tablet whole please discuss with your pharmacist how to take the medication safely.  Take Eliquis exactly as prescribed by your doctor and DO NOT stop taking Eliquis without talking to the doctor who prescribed the medication.  Stopping may increase your risk of developing a stroke.  Refill your prescription before you run out.  After discharge, you should have regular check-up appointments with your healthcare provider that is prescribing your Eliquis.  In the future your dose may need to be changed if your kidney function or weight changes by a significant amount or as you get older.  What do you do if you miss a dose? If you miss a dose, take it as soon as you remember on the same day and resume taking twice daily.  Do not take more than one dose of ELIQUIS at the same time to make up a missed dose.  Important Safety Information A possible side effect of Eliquis is bleeding. You should call your healthcare provider right away if you experience any of the following: ? Bleeding from an injury or your nose that does not stop. ? Unusual colored urine (red or dark brown) or unusual  colored stools (red or black). ? Unusual bruising for unknown reasons. ? A serious fall or if you hit your head (even if there is no bleeding).  Some medicines may interact with Eliquis and might increase your risk of bleeding or clotting while on Eliquis. To help avoid this, consult your healthcare provider or pharmacist prior to using any new prescription or non-prescription medications, including herbals, vitamins, non-steroidal anti-inflammatory drugs (NSAIDs) and supplements.  This website has more information on Eliquis (apixaban): http://www.eliquis.com/eliquis/home

## 2020-03-14 NOTE — Discharge Summary (Signed)
Discharge Summary    Patient ID: [name] MRN: [id number]; DOB: 1935/07/20  Admit date: 03/08/2020 Discharge date: 03/14/2020  Primary Care Provider: Cassandria Anger, MD  Primary Cardiologist: Kirk Ruths, MD  Primary Electrophysiologist:  None   Discharge Diagnoses    Principal Problem:   Acute combined systolic and diastolic heart failure [facility]) Active Problems:   [place]ural thrombus of left atrium 03/09/20   Hypoxia with need for home oxygen    Benign essential HTN   Persistent atrial fibrillation ([place])   CAD S/P percutaneous coronary angioplasty 08/2019   Dyslipidemia, goal LDL below 70   CKD (chronic kidney disease), stage III ([place])    Diagnostic Studies/Procedures    TTE 03/09/20  IMPRESSIONS    1. Left ventricular ejection fraction, by estimation, is 35 to 40%. The  left ventricle has moderately decreased function. The left ventricle  demonstrates global hypokinesis. There is mild concentric left ventricular  hypertrophy. Left ventricular  diastolic function could not be evaluated.  2. Right ventricular systolic function is moderately reduced. The right  ventricular size is mildly enlarged. There is severely elevated pulmonary  artery systolic pressure. The estimated right ventricular systolic  pressure is 67.6 mmHg.  3. Left atrial size was mild to moderately dilated.  4. Right atrial size was mildly dilated.  5. The mitral valve is grossly normal. Mild to moderate mitral valve  regurgitation. No evidence of mitral stenosis.  6. The aortic valve is tricuspid. There is mild calcification of the  aortic valve. There is mild thickening of the aortic valve. Aortic valve  regurgitation is mild. Mild aortic valve sclerosis is present, with no  evidence of aortic valve stenosis.  7. The inferior vena cava is dilated in size with <50% respiratory  variability, suggesting right atrial pressure of 15 mmHg.   Comparison(s): Changes from prior study are noted.  EF now moderately  reduced 35-40%. Global HK. RVSP elevated ~69 mmHG.   FINDINGS  Left Ventricle: Left ventricular ejection fraction, by estimation, is 35  to 40%. The left ventricle has moderately decreased function. The left  ventricle demonstrates global hypokinesis. The left ventricular internal  cavity size was normal in size.  There is mild concentric left ventricular hypertrophy. Left ventricular  diastolic function could not be evaluated due to atrial fibrillation. Left  ventricular diastolic function could not be evaluated.   Right Ventricle: The right ventricular size is mildly enlarged. No  increase in right ventricular wall thickness. Right ventricular systolic  function is moderately reduced. There is severely elevated pulmonary  artery systolic pressure. The tricuspid  regurgitant velocity is 3.68 m/s, and with an assumed right atrial  pressure of 15 mmHg, the estimated right ventricular systolic pressure is  72.0 mmHg.   Left Atrium: Left atrial size was mild to moderately dilated.   Right Atrium: Right atrial size was mildly dilated.   Pericardium: Trivial pericardial effusion is present.   Mitral Valve: The mitral valve is grossly normal. Mild to moderate mitral  valve regurgitation. No evidence of mitral valve stenosis.   Tricuspid Valve: The tricuspid valve is grossly normal. Tricuspid valve  regurgitation is mild . No evidence of tricuspid stenosis.   Aortic Valve: The aortic valve is tricuspid. There is mild calcification  of the aortic valve. There is mild thickening of the aortic valve. Aortic  valve regurgitation is mild. Aortic regurgitation PHT measures 296 msec.  Mild aortic valve sclerosis is  present, with no evidence of aortic valve stenosis.  Pulmonic Valve: The pulmonic valve was grossly normal. Pulmonic valve  regurgitation is trivial. No evidence of pulmonic stenosis.   Aorta: The aortic root is normal in size and structure.   Venous: The  inferior vena cava is dilated in size with less than 50%  respiratory variability, suggesting right atrial pressure of 15 mmHg.   IAS/Shunts: The atrial septum is grossly normal.   Additional Comments: There is a small pleural effusion in the left lateral  region.     LEFT VENTRICLE  PLAX 2D  LVIDd:     4.50 cm Diastology  LVIDs:     3.00 cm LV e' medial:  4.03 cm/s  LV PW:     1.30 cm LV E/e' medial: 21.2  LV IVS:    1.20 cm LV e' lateral:  8.59 cm/s  LVOT diam:   2.20 cm LV E/e' lateral: 9.9  LV SV:     71  LV SV Index:  47  LVOT Area:   3.80 cm     RIGHT VENTRICLE  RV Basal diam: 2.30 cm  RV S prime:   8.92 cm/s  TAPSE (M-mode): 1.4 cm   LEFT ATRIUM       Index    RIGHT ATRIUM      Index  LA diam:    4.10 cm 2.74 cm/m RA Area:   18.40 cm  LA Vol (A2C):  65.6 ml 43.91 ml/m RA Volume:  48.90 ml 32.73 ml/m  LA Vol (A4C):  45.3 ml 30.32 ml/m  LA Biplane Vol: 55.8 ml 37.35 ml/m  AORTIC VALVE  LVOT Vmax:  91.40 cm/s  LVOT Vmean: 67.000 cm/s  LVOT VTI:  0.186 m  AI PHT:   296 msec    AORTA  Ao Root diam: 2.80 cm   MITRAL VALVE        TRICUSPID VALVE  MV Area (PHT): 6.54 cm  TR Peak grad:  54.2 mmHg  MV Decel Time: 116 msec  TR Vmax:    368.00 cm/s  MV E velocity: 85.30 cm/s  MV A velocity: 37.70 cm/s SHUNTS  MV E/A ratio: 2.26    Systemic VTI: 0.19 m               Systemic Diam: 2.20 cm   Eleonore Chiquito MD  Electronically signed by Eleonore Chiquito MD  Signature Date/Time: 03/09/2020/12:57:38 PM   _____________    TEE 03/10/20 IMPRESSIONS    1. Global hypokinesis worse in inferior wall. Left ventricular ejection  fraction, by estimation, is 30 to 35%. The left ventricle has moderately  decreased function. The left ventricle demonstrates global hypokinesis.  The left ventricular internal cavity  size was moderately dilated. Left ventricular  diastolic function could  not be evaluated.  2. Right ventricular systolic function is moderately reduced. The right  ventricular size is mildly enlarged.  3. Dense spontaneous contrast. 1 x 1 cm acute/chronic LAA thrombus  confirmed on orthogonal views and with definity . Left atrial size was  moderately dilated. A left atrial/left atrial appendage thrombus was  detected.  4. Right atrial size was mildly dilated.  5. The mitral valve is degenerative. Mild mitral valve regurgitation.  Moderate mitral annular calcification.  6. The aortic valve is tricuspid. Aortic valve regurgitation is not  visualized. Mild to moderate aortic valve sclerosis/calcification is  present, without any evidence of aortic stenosis.  7. Pulmonic valve regurgitation is moderate.   FINDINGS  Left Ventricle: Global hypokinesis worse in inferior wall. Left  ventricular  ejection fraction, by estimation, is 30 to 35%. The left  ventricle has moderately decreased function. The left ventricle  demonstrates global hypokinesis. The left ventricular  internal cavity size was moderately dilated. There is no left ventricular  hypertrophy. Left ventricular diastolic function could not be evaluated.   Right Ventricle: The right ventricular size is mildly enlarged. Right  vetricular wall thickness was not assessed. Right ventricular systolic  function is moderately reduced.   Left Atrium: Dense spontaneous contrast. 1 x 1 cm acute/chronic LAA  thrombus confirmed on orthogonal views and with definity. Left atrial size  was moderately dilated. A left atrial/left atrial appendage thrombus was  detected.   Right Atrium: Right atrial size was mildly dilated.   Pericardium: There is no evidence of pericardial effusion.   Mitral Valve: The mitral valve is degenerative in appearance. There is  mild thickening of the mitral valve leaflet(s). There is mild  calcification of the mitral valve leaflet(s). Moderate mitral  annular  calcification. Mild mitral valve regurgitation.   Tricuspid Valve: The tricuspid valve is normal in structure. Tricuspid  valve regurgitation is mild.   Aortic Valve: The aortic valve is tricuspid. Aortic valve regurgitation is  not visualized. Mild to moderate aortic valve sclerosis/calcification is  present, without any evidence of aortic stenosis.   Pulmonic Valve: The pulmonic valve was normal in structure. Pulmonic valve  regurgitation is moderate.   Aorta: The aortic root is normal in size and structure.   IAS/Shunts: No atrial level shunt detected by color flow Doppler.   History of Present Illness     [name] is a 85 y.o. female with hx of PAF, pt has refused anticoagulation, CAD with Acute MI 08/2019 and PTCA 1st diag, HTN, diastolic CHF, presented to Office 03/09/20 with increased dyspnea despite diuretic and Cardizem for atrial fib.  She had DOE and orthopnea, increasing edema and her a fib with RVR at 108.  Non specific changes.  She was sent to [facility] for direct admit to manage HF and control atrial fib.     Prior to leaving the office Dr. Stanford Breed discussed anticoagulation and pt was agreeable to take. Eliquis was to be started.  Plan for TEE DCCV once reate controlled with IV dilt, BB held and anticoagulated.   It was felt the atrial fib with RVR was contributing to CHF.  She had tried flecainide but with CAD unable to use now.   BNP on admit was 3,154, K+ 4.7 Cr 1.86  Hgb 13, WBC 10.5, plts 412.    TSH 7.708 free T3 2.6, free T4 1.34   COVID neg PCXR Moderate bilateral pleural effusions with associated lung base opacity, the latter consistent with atelectasis. No convincing pneumonia or pulmonary edema and no change from the prior study.  Hospital Course     Consultants: none   Pt diuresed with IV lasix and on IV dilt her HR was controlled.   BP elevated Imdur and metoprolol added back.  On TTE she had decrease in EF and on TTE she was found to have thrombus  LAA so no DCCV was performed.  She had mild MR, mild TR and moderate PR.  Throughout care no symptoms of angina.    Wt at discharge is 47.6 Kg on admit 48.5 Kg  She is neg 2,904 since admit.  BNP at discharge remains high 3,757   Cr. 1.70  With walking on RA she desat to 88% when placed back on 2 L [place] her sp02  back up to 93%.  Will arrange home oxygen for pt.  She has home 02 but not portable so will need for discharge.    Plan for DCCV in 3-4 weeks with anticoagulation.   Her a fib with controlled rate at discharge.  BP stable at discharge and Cr remained stable at 1.7.    Did the patient have an acute coronary syndrome (MI, NSTEMI, STEMI, etc) this admission?:  No                               Did the patient have a percutaneous coronary intervention (stent / angioplasty)?:  No.       _____________  Discharge Vitals Blood pressure (!) 155/88, pulse 85, temperature 98.6 F (37 C), temperature source Oral, resp. rate 18, height 5\' 4"  (1.626 m), weight 47.6 kg, SpO2 93 %.  Filed Weights   03/12/20 0600 03/13/20 0616 03/14/20 0501  Weight: 46 kg 46.6 kg 47.6 kg    Labs & Radiologic Studies    CBC No results for input(s): WBC, NEUTROABS, HGB, HCT, MCV, PLT in the last 72 hours. Basic Metabolic Panel Recent Labs    03/12/20 1012 03/13/20 0758  NA 133* 134*  K 4.0 3.9  CL 89* 90*  CO2 32 32  GLUCOSE 153* 88  BUN 31* 29*  CREATININE 1.73* 1.70*  CALCIUM 8.1* 8.7*   Liver Function Tests No results for input(s): AST, ALT, ALKPHOS, BILITOT, PROT, ALBUMIN in the last 72 hours. No results for input(s): LIPASE, AMYLASE in the last 72 hours. High Sensitivity Troponin:   No results for input(s): TROPONINIHS in the last 720 hours.  BNP Invalid input(s): POCBNP D-Dimer No results for input(s): DDIMER in the last 72 hours. Hemoglobin A1C No results for input(s): HGBA1C in the last 72 hours. Fasting Lipid Panel No results for input(s): CHOL, HDL, LDLCALC, TRIG, CHOLHDL, LDLDIRECT  in the last 72 hours. Thyroid Function Tests No results for input(s): TSH, T4TOTAL, T3FREE, THYROIDAB in the last 72 hours.  Invalid input(s): FREET3 _____________  DG CHEST PORT 1 VIEW  Result Date: 03/08/2020 CLINICAL DATA:  Chest pain and cough today. EXAM: PORTABLE CHEST 1 VIEW COMPARISON:  02/03/2020 FINDINGS: Cardiac silhouette is mildly enlarged. Aorta is densely calcified. No mediastinal or hilar masses. There is opacity at both lung bases consistent with pleural effusions, which obscure the hemidiaphragms. Mild associated dependent lung base atelectasis. Remainder of the lungs is essentially clear with no convincing pneumonia or pulmonary edema. No pneumothorax. Skeletal structures are grossly intact. IMPRESSION: 1. Moderate bilateral pleural effusions with associated lung base opacity, the latter consistent with atelectasis. No convincing pneumonia or pulmonary edema and no change from the prior study. Electronically Signed   By: Lajean Manes M.D.   On: 03/08/2020 19:26   DG Foot 2 Views Right  Result Date: 03/08/2020 CLINICAL DATA:  Right foot pain and swelling. Dropped a heavy object on the right foot yesterday. EXAM: RIGHT FOOT - 2 VIEW COMPARISON:  None. FINDINGS: No fracture or bone lesion. Joints are normally aligned. Skeletal structures are demineralized. There are small dorsal and plantar calcaneal spurs. There is dorsal forefoot soft tissue swelling. IMPRESSION: No fracture or dislocation Electronically Signed   By: Lajean Manes M.D.   On: 03/08/2020 19:24   ECHOCARDIOGRAM COMPLETE  Result Date: 03/09/2020    ECHOCARDIOGRAM REPORT   Patient Name:   BENNETTA RUDDEN Date of Exam: 03/09/2020 Medical Rec #:  [id number]     Height:       64.0 in Accession #:    [id number]    Weight:       106.0 lb Date of Birth:  [birth year]     BSA:          1.494 m Patient Age:    16 years      BP:           153/69 mmHg Patient Gender: F             HR:           93 bpm. Exam Location:  Inpatient Procedure:  2D Echo, Cardiac Doppler and Color Doppler Indications:    Atrial fibrillation  History:        Patient has prior history of Echocardiogram examinations, most                 recent 09/16/2019. CAD and Previous Myocardial Infarction, COPD,                 Arrythmias:Atrial Fibrillation, Signs/Symptoms:Shortness of                 Breath; Risk Factors:Hypertension and Dyslipidemia.  Sonographer:    Dustin Flock Referring Phys: [id number] [place]  1. Left ventricular ejection fraction, by estimation, is 35 to 40%. The left ventricle has moderately decreased function. The left ventricle demonstrates global hypokinesis. There is mild concentric left ventricular hypertrophy. Left ventricular diastolic function could not be evaluated.  2. Right ventricular systolic function is moderately reduced. The right ventricular size is mildly enlarged. There is severely elevated pulmonary artery systolic pressure. The estimated right ventricular systolic pressure is 30.8 mmHg.  3. Left atrial size was mild to moderately dilated.  4. Right atrial size was mildly dilated.  5. The mitral valve is grossly normal. Mild to moderate mitral valve regurgitation. No evidence of mitral stenosis.  6. The aortic valve is tricuspid. There is mild calcification of the aortic valve. There is mild thickening of the aortic valve. Aortic valve regurgitation is mild. Mild aortic valve sclerosis is present, with no evidence of aortic valve stenosis.  7. The inferior vena cava is dilated in size with <50% respiratory variability, suggesting right atrial pressure of 15 mmHg. Comparison(s): Changes from prior study are noted. EF now moderately reduced 35-40%. Global HK. RVSP elevated ~69 mmHG. FINDINGS  Left Ventricle: Left ventricular ejection fraction, by estimation, is 35 to 40%. The left ventricle has moderately decreased function. The left ventricle demonstrates global hypokinesis. The left ventricular internal cavity size was  normal in size. There is mild concentric left ventricular hypertrophy. Left ventricular diastolic function could not be evaluated due to atrial fibrillation. Left ventricular diastolic function could not be evaluated. Right Ventricle: The right ventricular size is mildly enlarged. No increase in right ventricular wall thickness. Right ventricular systolic function is moderately reduced. There is severely elevated pulmonary artery systolic pressure. The tricuspid regurgitant velocity is 3.68 m/s, and with an assumed right atrial pressure of 15 mmHg, the estimated right ventricular systolic pressure is 65.7 mmHg. Left Atrium: Left atrial size was mild to moderately dilated. Right Atrium: Right atrial size was mildly dilated. Pericardium: Trivial pericardial effusion is present. Mitral Valve: The mitral valve is grossly normal. Mild to moderate mitral valve regurgitation. No evidence of mitral valve stenosis. Tricuspid Valve: The tricuspid valve is grossly normal. Tricuspid valve regurgitation is mild . No evidence of tricuspid stenosis. Aortic Valve: The aortic  valve is tricuspid. There is mild calcification of the aortic valve. There is mild thickening of the aortic valve. Aortic valve regurgitation is mild. Aortic regurgitation PHT measures 296 msec. Mild aortic valve sclerosis is present, with no evidence of aortic valve stenosis. Pulmonic Valve: The pulmonic valve was grossly normal. Pulmonic valve regurgitation is trivial. No evidence of pulmonic stenosis. Aorta: The aortic root is normal in size and structure. Venous: The inferior vena cava is dilated in size with less than 50% respiratory variability, suggesting right atrial pressure of 15 mmHg. IAS/Shunts: The atrial septum is grossly normal. Additional Comments: There is a small pleural effusion in the left lateral region.  LEFT VENTRICLE PLAX 2D LVIDd:         4.50 cm  Diastology LVIDs:         3.00 cm  LV e' medial:    4.03 cm/s LV PW:         1.30 cm  LV  E/e' medial:  21.2 LV IVS:        1.20 cm  LV e' lateral:   8.59 cm/s LVOT diam:     2.20 cm  LV E/e' lateral: 9.9 LV SV:         71 LV SV Index:   47 LVOT Area:     3.80 cm  RIGHT VENTRICLE RV Basal diam:  2.30 cm RV S prime:     8.92 cm/s TAPSE (M-mode): 1.4 cm LEFT ATRIUM             Index       RIGHT ATRIUM           Index LA diam:        4.10 cm 2.74 cm/m  RA Area:     18.40 cm LA Vol (A2C):   65.6 ml 43.91 ml/m RA Volume:   48.90 ml  32.73 ml/m LA Vol (A4C):   45.3 ml 30.32 ml/m LA Biplane Vol: 55.8 ml 37.35 ml/m  AORTIC VALVE LVOT Vmax:   91.40 cm/s LVOT Vmean:  67.000 cm/s LVOT VTI:    0.186 m AI PHT:      296 msec  AORTA Ao Root diam: 2.80 cm MITRAL VALVE               TRICUSPID VALVE MV Area (PHT): 6.54 cm    TR Peak grad:   54.2 mmHg MV Decel Time: 116 msec    TR Vmax:        368.00 cm/s MV E velocity: 85.30 cm/s MV A velocity: 37.70 cm/s  SHUNTS MV E/A ratio:  2.26        Systemic VTI:  0.19 m                            Systemic Diam: 2.20 cm Eleonore Chiquito MD Electronically signed by Eleonore Chiquito MD Signature Date/Time: 03/09/2020/12:57:38 PM    Final    ECHO TEE  Result Date: 03/10/2020    TRANSESOPHOGEAL ECHO REPORT   Patient Name:   DIVINITY KYLER Date of Exam: 03/10/2020 Medical Rec #:  [id number]     Height:       64.0 in Accession #:    [id number]    Weight:       106.3 lb Date of Birth:  January 09, 1936     BSA:          1.495 m Patient Age:    81 years  BP:           125/105 mmHg Patient Gender: F             HR:           88 bpm. Exam Location:  Inpatient Procedure: 3D Echo, Transesophageal Echo, Cardiac Doppler, Color Doppler and            Intracardiac Opacification Agent Indications:     R94.31 Abnormal EKG  History:         Patient has prior history of Echocardiogram examinations, most                  recent 03/09/2020. CHF, Previous Myocardial Infarction and CAD,                  Abnormal ECG, COPD, Arrythmias:Atrial Fibrillation,                  Signs/Symptoms:Dyspnea; Risk  Factors:Former Smoker.  Sonographer:     Roseanna Rainbow RDCS Referring Phys:  [id number] PAULA V ROSS Diagnosing Phys: Jenkins Rouge MD PROCEDURE: After discussion of the risks and benefits of a TEE, an informed consent was obtained from the patient. The transesophogeal probe was passed without difficulty through the esophogus of the patient. Local oropharyngeal anesthetic was provided with Cetacaine. Sedation performed by different physician. The patient was monitored while under deep sedation. Anesthestetic sedation was provided intravenously by Anesthesiology: 110mg  of Propofol. The patient's vital signs; including heart rate, blood  pressure, and oxygen saturation; remained stable throughout the procedure. The patient developed no complications during the procedure. IMPRESSIONS  1. Global hypokinesis worse in inferior wall. Left ventricular ejection fraction, by estimation, is 30 to 35%. The left ventricle has moderately decreased function. The left ventricle demonstrates global hypokinesis. The left ventricular internal cavity  size was moderately dilated. Left ventricular diastolic function could not be evaluated.  2. Right ventricular systolic function is moderately reduced. The right ventricular size is mildly enlarged.  3. Dense spontaneous contrast. 1 x 1 cm acute/chronic LAA thrombus confirmed on orthogonal views and with definity . Left atrial size was moderately dilated. A left atrial/left atrial appendage thrombus was detected.  4. Right atrial size was mildly dilated.  5. The mitral valve is degenerative. Mild mitral valve regurgitation. Moderate mitral annular calcification.  6. The aortic valve is tricuspid. Aortic valve regurgitation is not visualized. Mild to moderate aortic valve sclerosis/calcification is present, without any evidence of aortic stenosis.  7. Pulmonic valve regurgitation is moderate. FINDINGS  Left Ventricle: Global hypokinesis worse in inferior wall. Left ventricular ejection fraction, by  estimation, is 30 to 35%. The left ventricle has moderately decreased function. The left ventricle demonstrates global hypokinesis. The left ventricular internal cavity size was moderately dilated. There is no left ventricular hypertrophy. Left ventricular diastolic function could not be evaluated. Right Ventricle: The right ventricular size is mildly enlarged. Right vetricular wall thickness was not assessed. Right ventricular systolic function is moderately reduced. Left Atrium: Dense spontaneous contrast. 1 x 1 cm acute/chronic LAA thrombus confirmed on orthogonal views and with definity. Left atrial size was moderately dilated. A left atrial/left atrial appendage thrombus was detected. Right Atrium: Right atrial size was mildly dilated. Pericardium: There is no evidence of pericardial effusion. Mitral Valve: The mitral valve is degenerative in appearance. There is mild thickening of the mitral valve leaflet(s). There is mild calcification of the mitral valve leaflet(s). Moderate mitral annular calcification. Mild mitral valve regurgitation. Tricuspid Valve: The tricuspid valve is  normal in structure. Tricuspid valve regurgitation is mild. Aortic Valve: The aortic valve is tricuspid. Aortic valve regurgitation is not visualized. Mild to moderate aortic valve sclerosis/calcification is present, without any evidence of aortic stenosis. Pulmonic Valve: The pulmonic valve was normal in structure. Pulmonic valve regurgitation is moderate. Aorta: The aortic root is normal in size and structure. IAS/Shunts: No atrial level shunt detected by color flow Doppler. Jenkins Rouge MD Electronically signed by Jenkins Rouge MD Signature Date/Time: 03/10/2020/12:32:18 PM    Final    Disposition   Pt is being discharged home today in good condition.  Follow-up Plans & Appointments   If you have scales, weigh daily and if weight up 3 pounds in a day or 5 pounds in a week, call Dr. Jacalyn Lefevre office. Low salt diet to prevent  fluid from accumulating.   Call Dr. Jacalyn Lefevre office for any questions or issues       Follow-up Information    Crenshaw, Denice Bors, MD Follow up.   Specialty: Cardiology Why: the office should call you Monday with an appointment, if you have not heard from them by Tuesday - please call the office. Contact information: [address] [place] [address] [phone]                Discharge Medications   Allergies as of 03/14/2020      Reactions   Amoxicillin Other (See Comments)   "makes me crazy" Has patient had a PCN reaction causing immediate rash, facial/tongue/throat swelling, SOB or lightheadedness with hypotension: No Has patient had a PCN reaction causing severe rash involving mucus membranes or skin necrosis: No Has patient had a PCN reaction that required hospitalization: No Has patient had a PCN reaction occurring within the last 10 years: No If all of the above answers are "NO", then may proceed with Cephalosporin use.   Codeine Nausea And Vomiting   Levaquin [levofloxacin In D5w] Nausea And Vomiting   Other Nausea And Vomiting   Pt does not want to take any pain meds      Medication List    STOP taking these medications   aspirin 81 MG chewable tablet   clopidogrel 75 MG tablet Commonly known as: PLAVIX   nystatin 100000 UNIT/ML suspension Commonly known as: MYCOSTATIN   tobramycin 0.3 % ophthalmic solution Commonly known as: TOBREX   torsemide 100 MG tablet Commonly known as: DEMADEX     TAKE these medications   acetaminophen 325 MG tablet Commonly known as: TYLENOL Take 2 tablets (650 mg total) by mouth every 4 (four) hours as needed for headache or mild pain.   apixaban 2.5 MG Tabs tablet Commonly known as: ELIQUIS Take 1 tablet (2.5 mg total) by mouth 2 (two) times daily.   Biotin 1000 MCG tablet Take 1,000 mcg by mouth daily.   Cholecalciferol 25 MCG (1000 UT) capsule Take 1,000 Units by mouth daily.   clonazePAM  0.5 MG tablet Commonly known as: KLONOPIN Take 1 tablet (0.5 mg total) by mouth at bedtime as needed (leg cramps). TAKE 1 TABLET BY MOUTH AT BEDTIME AS NEEDED FOR LEG CRAMPS   cyanocobalamin 1000 MCG tablet Take 1,000 mcg by mouth daily.   diltiazem 120 MG 24 hr capsule Commonly known as: CARDIZEM CD Take 1 capsule (120 mg total) by mouth daily.   docusate sodium 100 MG capsule Commonly known as: COLACE Take 1 capsule (100 mg total) by mouth daily as needed for mild constipation.   furosemide 80 MG tablet  Commonly known as: LASIX Take 1 tablet (80 mg total) by mouth daily. Start taking on: March 15, 2020   hydrocerin Crea Apply 1 application topically 2 (two) times daily.   isosorbide mononitrate 30 MG 24 hr tablet Commonly known as: IMDUR Take 1 tablet (30 mg total) by mouth daily.   ketoconazole 2 % cream Commonly known as: NIZORAL APPLY TOPICALLY TO THE AFFECTED AREA DAILY What changed: See the new instructions.   metoprolol tartrate 100 MG tablet Commonly known as: LOPRESSOR Take 1 tablet (100 mg total) by mouth 2 (two) times daily. What changed:   medication strength  how much to take   nitroGLYCERIN 0.4 MG SL tablet Commonly known as: Nitrostat Place 1 tablet (0.4 mg total) under the tongue every 5 (five) minutes as needed for chest pain.   pantoprazole 40 MG tablet Commonly known as: PROTONIX Take 1 tablet (40 mg total) by mouth daily. What changed: See the new instructions.   potassium chloride 10 MEQ tablet Commonly known as: KLOR-CON Take 1 tablet (10 mEq total) by mouth daily.   rosuvastatin 10 MG tablet Commonly known as: CRESTOR Take 1 tablet (10 mg total) by mouth daily. What changed: Another medication with the same name was removed. Continue taking this medication, and follow the directions you see here.   Systane Balance 0.6 % Soln Generic drug: Propylene Glycol Place 1 drop into both eyes 2 (two) times daily as needed (dry eyes).    Trelegy Ellipta 100-62.5-25 MCG/INH Aepb Generic drug: Fluticasone-Umeclidin-Vilant Inhale 1 Inhaler into the lungs daily.            Durable Medical Equipment  (From admission, onward)         Start     Ordered   03/14/20 1044  For home use only DME oxygen  Once       Question Answer Comment  Length of Need 6 Months   Mode or (Route) Nasal cannula   Liters per Minute 2   Frequency Continuous (stationary and portable oxygen unit needed)   Oxygen conserving device Yes   Oxygen delivery system Gas      03/14/20 1043             Outstanding Labs/Studies   BMP  Duration of Discharge Encounter   Greater than 30 minutes including physician time.  Signed, Cecilie Kicks, NP 03/14/2020, 11:39 AM

## 2020-03-14 NOTE — Progress Notes (Signed)
Patient refused bed alarm, and stated that she wants to go to restroom by herself. Education was provided, but patient stated that she has not fallen for over 20 years, and does not need any help.

## 2020-03-14 NOTE — TOC Transition Note (Addendum)
Transition of Care [facility]) - CM/SW Discharge Note   Patient Details  Name: [name] MRN: [id number] Date of Birth: 08-Feb-1936  [facility]) CM/SW Contact:  Carles Collet, RN Phone Number: 03/14/2020, 2:42 PM   Clinical Narrative:    Damaris Schooner w patient at bedside. She states that she has home nocturnal/ PRN oxygen with [employer]. New orders for continuous. Notified [employer] of new order and for need for tank for transport home. Patient states that her sister [name] will pick her up, bedside nurse to call. Provided with Eliquis 30 day coupon.   Patient adamantly refusing to use oxygen upon DC, refusing to take home O2 for transport.     Final next level of care: Home/Self Care Barriers to Discharge: No Barriers Identified   Patient Goals and CMS Choice        Discharge Placement                       Discharge Plan and Services                DME Arranged: Oxygen DME Agency: [employer] Date DME Agency Contacted: 03/14/20 Time DME Agency Contacted: [phone] Representative spoke with at DME Agency: [place] ([facility]) Interventions     Readmission Risk Interventions Readmission Risk Prevention Plan 08/28/2019  Transportation Screening Complete  PCP or Specialist Appt within 5-7 Days Complete  Home Care Screening Complete  Medication Review (RN CM) Complete  Some recent data might be hidden

## 2020-03-14 NOTE — Progress Notes (Addendum)
Progress Note  Patient Name: [name] Date of Encounter: 03/14/2020  Primary Cardiologist: Kirk Ruths, MD   Subjective   I want to go home   Denies SOB   NO CP   Inpatient Medications    Scheduled Meds: . apixaban  2.5 mg Oral BID  . diltiazem  30 mg Oral Q6H  . docusate sodium  100 mg Oral BID  . feeding supplement  237 mL Oral TID BM  . furosemide  80 mg Intravenous BID  . hydrocerin   Topical BID  . isosorbide mononitrate  30 mg Oral Daily  . metoprolol tartrate  50 mg Oral QID  . multivitamin with minerals  1 tablet Oral Daily  . pantoprazole  40 mg Oral Daily  . rosuvastatin  10 mg Oral Daily   Continuous Infusions:  PRN Meds: acetaminophen, clonazePAM, hydrocortisone cream, hydrOXYzine, menthol-cetylpyridinium, nitroGLYCERIN, ondansetron (ZOFRAN) IV, phenol   Vital Signs    Vitals:   03/13/20 2323 03/13/20 2327 03/14/20 0456 03/14/20 0501  BP: (!) 140/109 (!) 140/109 (!) 148/69   Pulse:  71 90   Resp:   16   Temp:   98.6 F (37 C)   TempSrc:   Oral   SpO2:  100% 94%   Weight:    47.6 kg  Height:        Intake/Output Summary (Last 24 hours) at 03/14/2020 0701 Last data filed at 03/13/2020 2113 Gross per 24 hour  Intake 360 ml  Output --  Net 360 ml   Filed Weights   03/12/20 0600 03/13/20 0616 03/14/20 0501  Weight: 46 kg 46.6 kg 47.6 kg    Physical Exam   General:  Thin elderly, NAD Neck: . No JVD Lungs:Clear to ausculation bilaterally\ Cardiovascular: Irregularly irregular. No murmurs Abdomen: Soft, non-tender, non-distended. No obvious abdominal masses. Extremities: No edema. Radial pulses 2+ bilaterallyt.    Labs    Chemistry Recent Labs  Lab 03/08/20 1900 03/09/20 0524 03/11/20 0348 03/12/20 1012 03/13/20 0758  NA 139   < > 135 133* 134*  K 4.7   < > 3.2* 4.0 3.9  CL 89*   < > 89* 89* 90*  CO2 37*   < > 32 32 32  GLUCOSE 75   < > 80 153* 88  BUN 36*   < > 26* 31* 29*  CREATININE 1.86*   < > 1.66* 1.73* 1.70*   CALCIUM 9.3   < > 8.3* 8.1* 8.7*  PROT 6.1*  --   --   --   --   ALBUMIN 3.0*  --   --   --   --   AST 52*  --   --   --   --   ALT 36  --   --   --   --   ALKPHOS 59  --   --   --   --   BILITOT 0.9  --   --   --   --   GFRNONAA 27*   < > 31* 29* 30*  ANIONGAP 13   < > 14 12 12    < > = values in this interval not displayed.     Hematology Recent Labs  Lab 03/08/20 1900 03/09/20 0524  WBC 10.5 10.7*  RBC 4.89 4.52  HGB 13.0 12.0  HCT 41.0 37.7  MCV 83.8 83.4  MCH 26.6 26.5  MCHC 31.7 31.8  RDW 16.0* 16.2*  PLT 412* 377  Cardiac EnzymesNo results for input(s): TROPONINI in the last 168 hours. No results for input(s): TROPIPOC in the last 168 hours.   BNP Recent Labs  Lab 03/08/20 1900 03/12/20 1012 03/13/20 0759  BNP 3,154.1* 2,943.6* 3,757.7*     DDimer No results for input(s): DDIMER in the last 168 hours.   Radiology    No results found. Telemetry    Afib 80s - Personally Reviewed  ECG    No new Personally Reviewed  Cardiac Studies   Echo 03/09/20  1. Left ventricular ejection fraction, by estimation, is 35 to 40%. The left ventricle has moderately decreased function. The left ventricle demonstrates global hypokinesis. There is mild concentric left ventricular hypertrophy. Left ventricular diastolic function could not be evaluated. 2. Right ventricular systolic function is moderately reduced. The right ventricular size is mildly enlarged. There is severely elevated pulmonary artery systolic pressure. The estimated right ventricular systolic pressure is 93.7 mmHg. 3. Left atrial size was mild to moderately dilated. 4. Right atrial size was mildly dilated. 5. The mitral valve is grossly normal. Mild to moderate mitral valve regurgitation. No evidence of mitral stenosis. 6. The aortic valve is tricuspid. There is mild calcification of the aortic valve. There is mild thickening of the aortic valve. Aortic valve regurgitation is mild. Mild aortic  valve sclerosis is present, with no evidence of aortic valve stenosis. 7. The inferior vena cava is dilated in size with <50% respiratory variability, suggesting right atrial pressure of 15 mmHg.  Patient Profile     85 y.o. female with hx of CAD, chronic diastolic CHF, paroxysmal atrial fibrillation, HTN, HLD, and CKD stage 3, who was admitted to the hospital from clinic 03/08/20 after presenting with progressive DOE, orthopnea, and pedal edema, found to be in atrial fibrillation with RVR.   Assessment & Plan    1. Acute systolic CHF:  May be tachy induced   -Echocardiogram performed 03/09/20 with EF 35-40%, global hypokinesis, mild concentric LVH, indeterminate LV diastolic function, moderately reduced RV function with mild enlargement, severely elevated PA pressure, mild-moderate LAE, mild RAE, mild-moderate MR, mild AI, and mild TR -Pt has diuresed 3. L   Volume overall improved from admit  Hs been gitting IV lasix   I would send home on 80 po daily    Her Na is a little low  Pulling back Her BNP is up but this is in setting of Afib      2. Persistent atrial fibrillation with LAA thrombus per TEE: -She was started on apixaban 2.5mg  BID for stroke ppx. Rates improved with short term diltiazem gtt, now on metoprolol tartrate with rates generally stable in the 70s-100s.  -Plan was for TEE/DCCV, however she was found to have a LAA thrombus.  -Plan for Rx with apixaban 2.5mg  BID and metoprolol tartrate  Swithc to BID Dilt added as needed for rate control   -Plan DCCV after 3-4 weeks of uninterrupted anticoagulation  Will need amiodarone after cardioversion to maintain.    3. HTN: -Fair control  -Continue beta blocker and dilt    4. CAD: -s/p PTCA to Diag 08/2019 -No reports of chest pain or angina  -No ASA in the setting of AC   5. HLD: -Last LDL, 154    08/2019 -Continue statin therapy  REcheck LDL today    6. CKD stage III: -Creatinine, 1.7   Pt will need close follow up as  outpt as she has been fragile this week    Hopefully will do ok  until cardioversion      Dorris Carnes MD

## 2020-03-14 NOTE — Progress Notes (Addendum)
Patient refused to take home oxygen for transport and stated that she does not need oxygen. Rechecked her oxygen at rest on RA:91 %.

## 2020-03-14 NOTE — Progress Notes (Signed)
SATURATION QUALIFICATIONS: (This note is used to comply with regulatory documentation for home oxygen)  Patient Saturations on Room Air at Rest = 88%  Patient Saturations on Room Air while Ambulating = 88%  Patient Saturations on 2 Liters of oxygen while Ambulating = 93%  Please briefly explain why patient needs home oxygen: Patient desat to 88% while ambulating on RA

## 2020-03-15 ENCOUNTER — Telehealth: Payer: Self-pay | Admitting: Internal Medicine

## 2020-03-15 MED ORDER — NYSTATIN 100000 UNIT/ML MT SUSP: 5.0000 mL | Freq: Four times a day (QID) | OROMUCOSAL | 0 refills | Status: DC

## 2020-03-15 NOTE — Telephone Encounter (Signed)
Called in nystatin liquid to swish and swallow 5 mL 4 times a day for 5 days.

## 2020-03-15 NOTE — Telephone Encounter (Signed)
Patients sister calling to request RX for patient's issue with thrush and sores in mouth

## 2020-03-15 NOTE — Telephone Encounter (Signed)
Called pt sister there was no answer LMOM mouth wash sent to pof.Marland KitchenJohny Chess

## 2020-03-15 NOTE — Telephone Encounter (Signed)
    Mary from [facility] calling for resumption of care order for nursing and PT Phone [phone]

## 2020-03-15 NOTE — Telephone Encounter (Signed)
Tried to call [facility] back.. lady picked up phone and said she was transferring me to her, but no one answer the phone x's 10 rings.Marland KitchenJohny Chess

## 2020-03-15 NOTE — Telephone Encounter (Signed)
MD is out of the office pls advise../lmb 

## 2020-03-16 ENCOUNTER — Telehealth: Payer: Self-pay | Admitting: Internal Medicine

## 2020-03-16 ENCOUNTER — Telehealth: Payer: Self-pay | Admitting: Cardiology

## 2020-03-16 NOTE — Telephone Encounter (Signed)
Spoke to patient she said her heart rate and B/P have been elevated since she came home from hospital.Stated she wanted to know why her medications were changed.Stated she feels awful.This morning B/P 176/72 pulse 107.[place] B/P 148/64 pulse 117.Medications reviewed with patient.She has not been taking Metoprolol 100 mg.She will start taking today as prescribed 100 mg twice a day.Post hospital appointment scheduled with Coletta Memos NP 03/25/20 at 2:15 pm.Advised to bring a list of B/P readings and all medications to appointment.

## 2020-03-16 NOTE — Telephone Encounter (Signed)
STAT if HR is under 50 or over 120 (normal HR is 60-100 beats per minute)  1) What is your heart rate? 117  2) Do you have a log of your heart rate readings (document readings)? No log available.  3) Do you have any other symptoms? Patient states her HR has been extremely elevated. She is requesting a call from Dr. Jacalyn Lefevre nurse to discuss further.

## 2020-03-16 NOTE — Telephone Encounter (Signed)
[name] from [facility] calling to make [place] aware that the patient is refusing PT but they are going to continue with nursing.  [phone]

## 2020-03-17 ENCOUNTER — Telehealth: Payer: Self-pay | Admitting: Internal Medicine

## 2020-03-17 DIAGNOSIS — N179 Acute kidney failure, unspecified: Secondary | ICD-10-CM | POA: Diagnosis not present

## 2020-03-17 DIAGNOSIS — Z7952 Long term (current) use of systemic steroids: Secondary | ICD-10-CM | POA: Diagnosis not present

## 2020-03-17 DIAGNOSIS — I480 Paroxysmal atrial fibrillation: Secondary | ICD-10-CM | POA: Diagnosis not present

## 2020-03-17 DIAGNOSIS — Z87891 Personal history of nicotine dependence: Secondary | ICD-10-CM | POA: Diagnosis not present

## 2020-03-17 DIAGNOSIS — R29898 Other symptoms and signs involving the musculoskeletal system: Secondary | ICD-10-CM | POA: Diagnosis not present

## 2020-03-17 DIAGNOSIS — E538 Deficiency of other specified B group vitamins: Secondary | ICD-10-CM | POA: Diagnosis not present

## 2020-03-17 DIAGNOSIS — R42 Dizziness and giddiness: Secondary | ICD-10-CM | POA: Diagnosis not present

## 2020-03-17 DIAGNOSIS — Z7982 Long term (current) use of aspirin: Secondary | ICD-10-CM | POA: Diagnosis not present

## 2020-03-17 DIAGNOSIS — Z9181 History of falling: Secondary | ICD-10-CM | POA: Diagnosis not present

## 2020-03-17 DIAGNOSIS — E559 Vitamin D deficiency, unspecified: Secondary | ICD-10-CM | POA: Diagnosis not present

## 2020-03-17 DIAGNOSIS — R634 Abnormal weight loss: Secondary | ICD-10-CM | POA: Diagnosis not present

## 2020-03-17 DIAGNOSIS — I2510 Atherosclerotic heart disease of native coronary artery without angina pectoris: Secondary | ICD-10-CM | POA: Diagnosis not present

## 2020-03-17 DIAGNOSIS — I252 Old myocardial infarction: Secondary | ICD-10-CM | POA: Diagnosis not present

## 2020-03-17 DIAGNOSIS — F32A Depression, unspecified: Secondary | ICD-10-CM | POA: Diagnosis not present

## 2020-03-17 DIAGNOSIS — I5031 Acute diastolic (congestive) heart failure: Secondary | ICD-10-CM | POA: Diagnosis not present

## 2020-03-17 DIAGNOSIS — N183 Chronic kidney disease, stage 3 unspecified: Secondary | ICD-10-CM | POA: Diagnosis not present

## 2020-03-17 DIAGNOSIS — M19042 Primary osteoarthritis, left hand: Secondary | ICD-10-CM | POA: Diagnosis not present

## 2020-03-17 DIAGNOSIS — I130 Hypertensive heart and chronic kidney disease with heart failure and stage 1 through stage 4 chronic kidney disease, or unspecified chronic kidney disease: Secondary | ICD-10-CM | POA: Diagnosis not present

## 2020-03-17 DIAGNOSIS — Z7902 Long term (current) use of antithrombotics/antiplatelets: Secondary | ICD-10-CM | POA: Diagnosis not present

## 2020-03-17 DIAGNOSIS — J449 Chronic obstructive pulmonary disease, unspecified: Secondary | ICD-10-CM | POA: Diagnosis not present

## 2020-03-17 DIAGNOSIS — B370 Candidal stomatitis: Secondary | ICD-10-CM | POA: Diagnosis not present

## 2020-03-17 DIAGNOSIS — M19041 Primary osteoarthritis, right hand: Secondary | ICD-10-CM | POA: Diagnosis not present

## 2020-03-17 NOTE — Telephone Encounter (Signed)
   Patient requesting cough medication be called to pharmacy She states she still has a cough since being discharged from hospital

## 2020-03-17 NOTE — Telephone Encounter (Signed)
Notified Mary ok for Nursing eval../lmb

## 2020-03-18 ENCOUNTER — Telehealth: Payer: Self-pay | Admitting: Cardiology

## 2020-03-18 NOTE — Telephone Encounter (Signed)
   Pt c/o medication issue:  1. Name of Medication: all heart medications  2. How are you currently taking this medication (dosage and times per day)?   3. Are you having a reaction (difficulty breathing--STAT)?   4. What is your medication issue? Pt said she wanted to speak with a nurse to discuss all her medications. She said there's something wrong because she is getting weaker and she can't walk. She requested to get a call back as soon as possible

## 2020-03-18 NOTE — Telephone Encounter (Signed)
Pt needs to continue lopressor at present dose for rate control of atrial fibrillation Kirk Ruths

## 2020-03-18 NOTE — Telephone Encounter (Signed)
Spoke with pt who states since recent med changes from the hospital, she feels worse. She report feeling more SOB but denies swelling or increased weight. She stated she has no energy and feels like she can't operate. Pt state she feel 100 mg of Metoprolol is too much and requesting to return to previous dose. Current BP 112/57 HR 71.   Will forward to MD

## 2020-03-19 ENCOUNTER — Telehealth: Payer: Self-pay | Admitting: Cardiology

## 2020-03-19 NOTE — Telephone Encounter (Signed)
STAT if patient feels like he/she is going to faint   1) Are you dizzy now? Yes  2) Do you feel faint or have you passed out? No   3) Do you have any other symptoms? SOB   4) Have you checked your HR and BP (record if available)? No   Pt c/o Shortness Of Breath: STAT if SOB developed within the last 24 hours or pt is noticeably SOB on the phone  1. Are you currently SOB (can you hear that pt is SOB on the phone)? Yes   2. How long have you been experiencing SOB? Has been going on for awhile   3. Are you SOB when sitting or when up moving around? Both   4. Are you currently experiencing any other symptoms? Dizziness  Thinks one of the medications is causing her dizziness.

## 2020-03-19 NOTE — Telephone Encounter (Signed)
Called to follow up with pt to echo Dr. Jacalyn Lefevre advice to be seen in the ER. Pt did not answer. Left detailed message urging pt to be evaluated if she was not already arranging to do so.

## 2020-03-19 NOTE — Telephone Encounter (Signed)
Agree with ER eval Kirk Ruths

## 2020-03-19 NOTE — Telephone Encounter (Signed)
Direct transfer into triage. Pt states she is having extreme dizziness. Pt is audibly short of breath on the phone. She is questioning whether he medications need to be adjusted. When asked if pt feels like she will pass out, she states, "I'm not gonna get up and find out." Advised pt that given her severe symptoms, she should call 911. Pt states "I'm not gonna do that. They don't do anything for you." Advised pt that she should not delay evaluation. Pt states she has a home care nurse who can come to see her, and that she will give her a call to let her know how she is feeling. Advised the pt that a visit from her nurse is not a substitute for EMS.

## 2020-03-21 MED ORDER — HYDROCODONE-HOMATROPINE 5-1.5 MG/5ML PO SYRP: 5.0000 mL | ORAL_SOLUTION | Freq: Three times a day (TID) | ORAL | 0 refills | Status: DC | PRN

## 2020-03-21 NOTE — Telephone Encounter (Signed)
Okay Hycodan.  Thanks

## 2020-03-22 ENCOUNTER — Other Ambulatory Visit: Payer: Self-pay | Admitting: *Deleted

## 2020-03-22 ENCOUNTER — Encounter: Payer: Self-pay | Admitting: *Deleted

## 2020-03-22 DIAGNOSIS — I509 Heart failure, unspecified: Secondary | ICD-10-CM

## 2020-03-22 NOTE — Patient Outreach (Signed)
[facility]) Care Management  [place]  03/23/2020   [name] 1935-05-27 [id number]    RED ON EMMI ALERT - General Discharge Day # 4 Date: 1/14 Red Alert Reason: lost interest in things   Outreach attempt #1, successful.  Identity verified.  This care manager introduced self and stated purpose of call.  [facility] care management services explained.     Social: Member report she answered losing interest in things due to recent hospitalization.  State she is feeling no better than when she was admitted.  She is still having intermittent shortness of breath and swelling in her extremities.  Noted per chart that she was advised by cardiology office to present to the ED on 1/14 after a triage call however she didn't go.  Discussed need for evaluation again today, she state she is not going to the ED again, fear of contracting Covid.  Discussed precautions and benefits of assessment, she again declines.  She did have nurse from [place] visit the home once but state she has called the office and told them not to send anyone out, feels they didn't do anything additional to what she was already doing.    She still feels as if she need someone to come in to help with reviewing her medications and assessment of needs.  She feel it is hard for her to work with people over the phone due to her shortness of breath and hearing.  Discussed having [facility] team to visit the home, she agrees.  She lives alone, only family she has is her 83 year old sister.  State she is able to bathe and dress on her own as well as preparing meals, has to take her time due to breathing.  She would benefit from having in home aide services, however unable to afford.  Refuses to consider ALF placement as well.  Conditions: Per chart, has history of A-fib, HTN, CAD, CHF, MI, COPD, GERD, Dyslipidemia, CKD, and osteoarthritis.  Medications: Reviewed with member, state she is starting to have a more  difficulty time managing them.  Would love to have someone review them with her in the home and possibly have pill packaging to make management easier.  She had in the past refused anticoagulation therapy, now agrees and taking.  She will take for 3 weeks and plan for cardioversion.  Appointments: Follow up appointment with cardiology on 1/20, sister will provide transportation.  Does not want to depend on her for much of anything else as she is 85 years old with limited ability to help.  Also has appointment with pulmonology on 1/24.     Encounter Medications:  Outpatient Encounter Medications as of 03/22/2020  Medication Sig Note  . apixaban (ELIQUIS) 2.5 MG TABS tablet Take 1 tablet (2.5 mg total) by mouth 2 (two) times daily.   . Biotin 1000 MCG tablet Take 1,000 mcg by mouth daily.   . Cholecalciferol 25 MCG (1000 UT) capsule Take 1,000 Units by mouth daily.   . clonazePAM (KLONOPIN) 0.5 MG tablet Take 1 tablet (0.5 mg total) by mouth at bedtime as needed (leg cramps). TAKE 1 TABLET BY MOUTH AT BEDTIME AS NEEDED FOR LEG CRAMPS   . cyanocobalamin 1000 MCG tablet Take 1,000 mcg by mouth daily.   Marland Kitchen diltiazem (CARDIZEM CD) 120 MG 24 hr capsule Take 1 capsule (120 mg total) by mouth daily.   Marland Kitchen docusate sodium (COLACE) 100 MG capsule Take 1 capsule (100 mg total) by  mouth daily as needed for mild constipation.   . Fluticasone-Umeclidin-Vilant (TRELEGY ELLIPTA) 100-62.5-25 MCG/INH AEPB Inhale 1 Inhaler into the lungs daily. 03/08/2020: Patient reports she is not taking this.  . hydrocerin (EUCERIN) CREA Apply 1 application topically 2 (two) times daily.   Marland Kitchen HYDROcodone-homatropine (HYCODAN) 5-1.5 MG/5ML syrup Take 5 mLs by mouth every 8 (eight) hours as needed for cough.   . isosorbide mononitrate (IMDUR) 30 MG 24 hr tablet Take 1 tablet (30 mg total) by mouth daily.   Marland Kitchen ketoconazole (NIZORAL) 2 % cream APPLY TOPICALLY TO THE AFFECTED AREA DAILY (Patient taking differently: Apply 1 application  topically daily as needed for irritation.)   . metoprolol tartrate (LOPRESSOR) 100 MG tablet Take 1 tablet (100 mg total) by mouth 2 (two) times daily.   . nitroGLYCERIN (NITROSTAT) 0.4 MG SL tablet Place 1 tablet (0.4 mg total) under the tongue every 5 (five) minutes as needed for chest pain.   Marland Kitchen nystatin (MYCOSTATIN) 100000 UNIT/ML suspension Take 5 mLs (500,000 Units total) by mouth 4 (four) times daily.   . pantoprazole (PROTONIX) 40 MG tablet Take 1 tablet (40 mg total) by mouth daily.   . potassium chloride (KLOR-CON) 10 MEQ tablet Take 1 tablet (10 mEq total) by mouth daily.   Marland Kitchen Propylene Glycol (SYSTANE BALANCE) 0.6 % SOLN Place 1 drop into both eyes 2 (two) times daily as needed (dry eyes).   . rosuvastatin (CRESTOR) 10 MG tablet Take 1 tablet (10 mg total) by mouth daily.   . [DISCONTINUED] furosemide (LASIX) 80 MG tablet Take 1 tablet (80 mg total) by mouth daily.   Marland Kitchen acetaminophen (TYLENOL) 325 MG tablet Take 2 tablets (650 mg total) by mouth every 4 (four) hours as needed for headache or mild pain. (Patient not taking: Reported on 03/22/2020)    No facility-administered encounter medications on file as of 03/22/2020.    Functional Status:  In your present state of health, do you have any difficulty performing the following activities: 09/16/2019 09/02/2019  Hearing? Tempie Donning  Comment [place]? Y N  Comment having blurred vision -  Difficulty concentrating or making decisions? N Y  Walking or climbing stairs? Y Y  Dressing or bathing? N N  Doing errands, shopping? Tempie Donning  Preparing Food and eating ? - Y  Using the Toilet? - N  In the past six months, have you accidently leaked urine? - Y  Do you have problems with loss of bowel control? - N  Managing your Medications? - N  Managing your Finances? - N  Housekeeping or managing your Housekeeping? - Y  Some recent data might be hidden    Fall/Depression Screening: Fall Risk  09/02/2019 01/01/2018 08/10/2016  Falls in the past year?  0 No No  Number falls in past yr: 0 - -  Injury with Fall? 0 - -  Risk for fall due to : Impaired balance/gait - -  Follow up Falls evaluation completed - -   PHQ 2/9 Scores 03/22/2020 09/02/2019 01/01/2018 01/01/2018 08/10/2016 09/17/2015 08/28/2014  PHQ - 2 Score 3 2 0 0 0 0 0  PHQ- 9 Score 11 - - - - - -    Assessment:  Goals Addressed            This Visit's Progress   . [facility] - Find Help in My Community       Timeframe:  Short-Term Goal Priority:  Medium Start Date:  03/22/2020                   Expected End Date:      04/22/2020                 Follow Up Date 04/05/2020   - follow-up on any referrals for help I am given - have a back-up plan - make a list of family or friends that I can call    Why is this important?    Knowing how and where to find help for yourself or family in your neighborhood and community is an important skill.   You will want to take some steps to learn how.    Notes:   1/17 - Referral placed to Care Guide to help with food stamp application    . [facility] - Track and Manage Fluids and Swelling-Heart Failure       Timeframe:  Short-Term Goal Priority:  High Start Date:       03/22/2020                      Expected End Date:        02/19/2021               Follow Up Date 04/05/2020    - call office if I gain more than 2 pounds in one day or 5 pounds in one week - keep legs up while sitting - track weight in diary - use salt in moderation - watch for swelling in feet, ankles and legs every day - weigh myself daily    Why is this important?    It is important to check your weight daily and watch how much salt and liquids you have.   It will help you to manage your heart failure.    Notes:     . [facility] - Track and Manage Heart Rate and Rhythm-Atrial Fibrillation       Timeframe:  Long-Range Goal Priority:  Medium Start Date:       03/22/2020                      Expected End Date:      05/20/2020                 Follow Up Date 04/05/2020     - bring symptom diary to all appointments - check pulse (heart) rate before taking medicine - check pulse (heart) rate once a day - make a plan to eat healthy - keep all lab appointments    Why is this important?    Atrial fibrillation may have no symptoms. Sometimes the symptoms get worse or happen more often.   It is important to keep track of what your symptoms are and when they happen.   A change in symptoms is important to discuss with your doctor or nurse.   Being active and healthy eating will also help you manage your heart condition.     Notes:        Plan:  Follow-up:  Patient agrees to Care Plan and Follow-up.  Will place referral to [facility] for in home assessment of needs.  Will place referral to Care Guide for food resources.  Will notify PCP of involvement.  Will send education regarding A-fib management.  Will send [facility] calendar book.  Will follow up with member within the next 2 weeks.  Valente David, RN, MSN [facility] Care Management  [facility] Care Manager [phone]

## 2020-03-22 NOTE — Progress Notes (Deleted)
Cardiology Clinic Note   Patient Name: [name] Date of Encounter: 03/22/2020  Primary Care Provider:  Cassandria Anger, MD Primary Cardiologist:  Kirk Ruths, MD  Patient Profile    Stephanie Q. Lee26 year old female presents the clinic today for follow-up of her essential hypertension, paroxysmal atrial fibrillation, and coronary artery disease.  Past Medical History    Past Medical History:  Diagnosis Date  . Anxiety   . Arthritis   . Atrial fibrillation ([place])   . B12 deficiency   . COPD (chronic obstructive pulmonary disease) ([place])   . Depression   . GERD (gastroesophageal reflux disease)   . Hypertension   . Hypoxia with need for home oxygen  03/14/2020  . [place] thrombus of left atrium 03/09/20 03/14/2020  . Osteoarthrosis, hand    both hands  . Peptic ulcer, unspecified site, unspecified as acute or chronic, without mention of hemorrhage, perforation, or obstruction   . Pneumonia   . PONV (postoperative nausea and vomiting)   . Vitamin D deficiency disease    Past Surgical History:  Procedure Laterality Date  . BLADDER SURGERY     Bladder tack and intestines  . CORONARY/GRAFT ACUTE MI REVASCULARIZATION N/A 08/26/2019   Procedure: Coronary/Graft Acute MI Revascularization;  Surgeon: Sherren Mocha, MD;  Location: [facility];  Service: Cardiovascular;  Laterality: N/A;  . CYSTOCELE REPAIR    . CYSTOSCOPY WITH RETROGRADE PYELOGRAM, URETEROSCOPY AND STENT PLACEMENT Right 09/11/2016   Procedure: CYSTOSCOPY WITH RETROGRADE PYELOGRAM, URETEROSCOPY AND STENT REPLACEMENT;  Surgeon: Cleon Gustin, MD;  Location: [facility];  Service: Urology;  Laterality: Right;  . CYSTOSCOPY WITH STENT PLACEMENT Right 08/11/2016   Procedure: CYSTOSCOPY, URETERSCOPY,  RIGHT RETROGRADE WITH RIGHT STENT PLACEMENT;  Surgeon: Festus Aloe, MD;  Location: [facility];  Service: Urology;  Laterality: Right;  . SPLENECTOMY    . TEE WITHOUT CARDIOVERSION N/A 03/10/2020   Procedure:  TRANSESOPHAGEAL ECHOCARDIOGRAM (TEE);  Surgeon: Josue Hector, MD;  Location: [facility];  Service: Cardiovascular;  Laterality: N/A;    Allergies  Allergies  Allergen Reactions  . Amoxicillin Other (See Comments)    "makes me crazy" Has patient had a PCN reaction causing immediate rash, facial/tongue/throat swelling, SOB or lightheadedness with hypotension: No Has patient had a PCN reaction causing severe rash involving mucus membranes or skin necrosis: No Has patient had a PCN reaction that required hospitalization: No Has patient had a PCN reaction occurring within the last 10 years: No If all of the above answers are "NO", then may proceed with Cephalosporin use.   . Codeine Nausea And Vomiting  . Levaquin [Levofloxacin In D5w] Nausea And Vomiting  . Other Nausea And Vomiting    Pt does not want to take any pain meds    History of Present Illness    [name] has a PMH of essential hypertension, paroxysmal atrial fibrillation (not on anticoagulation-patient refused), syncope, varicose veins of both lower extremities, CAD status post PCA, COPD with chronic bronchitis, acute sinusitis, CKD stage III, and emphysema.  She is admitted to the hospital on 08/26/2019 for acute lateral STEMI and underwent PTCA of the first diagonal which was100 %percent stenosed (reduced to 50%) no PCI was done due to the small caliber of the vessel and minimal downstream blood flow.  She had been in usual state of health until the morning of presentation when she developed severe substernal chest pain with nausea and weakness. She indicated that she felt ill throughout the morning and eventually called  EMS. Her EKG on arrival demonstrated lateral STEMI and a code STEMI was activated. Upon arrival to the emergency department she continued to have discomfort in her chest, felt lightheaded, and nauseous. She indicated that she had some shortness of breath as well. However, she has known COPD.  Echocardiogram showed normal LVEF with suboptimal view for wall motion assessments, mild LVH and no valvular disease. Her creatinine was elevated but appeared to be at her more recent baseline of 1.5-2.0. Her creatinine on 08/27/2019 was 1.82. Peak high-sensitivity troponins 3863. Her LDL 154. Hemoglobin A1c 6.3. While she was ambulating with cardiac rehab her oxygen saturation dropped to 87% therefore she was discharged on supplemental oxygen, 3 L nasal cannula.  She presentedto the clinic 7/7/2021for follow-up evaluation and statedshe didnot want to be on clopidogrel. She feltthat being on blood thinner wastoo high of a risk. It was explained that she neededthis medication to keep her diagonal lesion PTCA open. She indicatedthat she hadoccasional palpitations at night when laying down to go to sleep. On exam her heart rate and rhythmwere regular. I increasedher metoprolol to 25 mg twice daily due to elevated blood pressure and plannedher follow-up with me in 1 month for reevaluation.   She presented to the emergency department on 09/12/2019 with complaints of shortness of breath and elevated blood pressure. Her echocardiogram showed low normal LV function. Her Lopressor was increased to 50 mg twice daily. She was instructed to weigh herself daily. Her creatinine has slightly increased from 1.79-1.83.  She was seen in follow-up by Dr. Stanford Breed on 10/28/2019 for her proximal atrial fibrillation. She again refused anticoagulation. She indicated that she had DOE, orthopnea and bilateral lower extremity edema. She denied chest pain syncope. Furosemide was added back into her medication regimen for lower extremity edema.  She presented to the clinic 11/05/2019 for follow-up evaluation and statedher breathing had not improved much. She stated that her shoes were fitting much better and her pants /clothes were fitting much better. However, she was concerned that she was losing so  much weight. She stated that she was drinking 15 cups of water per day and eating 5-6 times per day. She did not understand why she was still losing weight. We discussed the importance of limiting her fluid and weighing herself daily. She expressed understanding that the fluid would be impacting her breathing. I  continued her current dose of furosemide, had her restrict her fluids, continued to weigh daily, avoid salt in her diet and planned follow-up as scheduled with Dr. Stanford Breed.  She contacted the nurse triage line on 11/11/2019 and indicated that she had continued shortness of breath and that her heart was out of rhythm.  She indicated that she was unable to do daily activities and felt awful.  She present to the clinic 11/14/19 and stated she had reduced her fluid consumption to 64 ounces daily. Her weight has maintained at 116 pounds. She stated that she felt her heart going in and out of rhythm and felt like she was increasingly short of breath. She had been prescribed home oxygen which she stated she did not like to use. She only used her home oxygen when she felt bad. We discussed the need for her oxygen with her COPD. She expressed understanding. However, she stated she did not wish to bring her oxygen out of her house. I  asked her to follow-up with her PCP and her oxygen prescriber so that she may obtain portable oxygen. She stated that she felt much  better when she was on her flecainide. We did review her EKG today and I showed her that she continues to be in normal sinus rhythm at a normal rate. I will decreased her furosemide to 10 mg daily, continued her fluid restriction and  encouraged her to maintain her low-sodium diet. She stated that she would contact her PCP to discuss portable oxygen. I planned follow-up in 3 months.  She was seen in follow-up by Kerin Ransom, PA-C on 02/13/2020.  During that time she complained of increasing shortness of breath.  Her EKG showed atrial fibrillation  with ventricular response of 114 bpm.  She admitted to not taking her torsemide.  She had trace lower extremity edema on exam and her blood pressure was elevated at 160/90.  Her torsemide 50 mg daily and diltiazem 120 mg was added back to her medication regimen.   She was a direct admit from the office to the hospital 03/08/2020. She complained of dyspnea on exertion, orthopnea and increasing lower extremity edema. Her EKG showed atrial fibrillation with RVR with a rate of 108 bpm. Prior to leaving the office she was agreeable to taking anticoagulation. Apixaban was started at that time. The plan was made for TEE DCCV once rate control was achieved with IV diltiazem. It was felt that her atrial fibrillation with RVR was contributing to her CHF. She was previously tried on flecainide but due to her coronary artery disease was unable to use. She was COVID-negative. Her CXR showed moderate bilateral pleural effusion with associated lung base opacity and was consistent with atelectasis. No convincing pneumonia or pulmonary edema and no changes from prior study were noted.  She received IV diuresis and was placed on IV diltiazem for heart rate control. Her Imdur and metoprolol were added back for blood pressure control. Her echocardiogram showed a thrombus in her left atrial appendage so DCCV was not performed. She was also noted to have mild MR, mild TR and moderate PR. She denied chest pain throughout her hospitalization. The plan was made for DCCV in 3-4 weeks with anticoagulation. At discharge her BP was stable and her creatinine remained at 1.7.   Contacted nurse triage line on 03/23/2020 with complaints of increased shortness of breath. Her furosemide was discontinued and she was placed on Demadex 40 mg twice a day. Plan for bmet in 1 week.  She presents the clinic today for follow-up evaluation states***  *** denies chest pain, shortness of breath, lower extremity edema, fatigue, palpitations, melena,  hematuria, hemoptysis, diaphoresis, weakness, presyncope, syncope, orthopnea, and PND.     Home Medications    Prior to Admission medications   Medication Sig Start Date End Date Taking? Authorizing Provider  acetaminophen (TYLENOL) 325 MG tablet Take 2 tablets (650 mg total) by mouth every 4 (four) hours as needed for headache or mild pain. 03/14/20   Isaiah Serge, NP  apixaban (ELIQUIS) 2.5 MG TABS tablet Take 1 tablet (2.5 mg total) by mouth 2 (two) times daily. 03/14/20   Isaiah Serge, NP  Biotin 1000 MCG tablet Take 1,000 mcg by mouth daily.    [provider]  Cholecalciferol 25 MCG (1000 UT) capsule Take 1,000 Units by mouth daily.    [provider]  clonazePAM (KLONOPIN) 0.5 MG tablet Take 1 tablet (0.5 mg total) by mouth at bedtime as needed (leg cramps). TAKE 1 TABLET BY MOUTH AT BEDTIME AS NEEDED FOR LEG CRAMPS 03/14/20   Isaiah Serge, NP  cyanocobalamin 1000 MCG tablet  Take 1,000 mcg by mouth daily.    [provider]  diltiazem (CARDIZEM CD) 120 MG 24 hr capsule Take 1 capsule (120 mg total) by mouth daily. 03/14/20   Isaiah Serge, NP  docusate sodium (COLACE) 100 MG capsule Take 1 capsule (100 mg total) by mouth daily as needed for mild constipation. 03/14/20   Isaiah Serge, NP  Fluticasone-Umeclidin-Vilant (TRELEGY ELLIPTA) 100-62.5-25 MCG/INH AEPB Inhale 1 Inhaler into the lungs daily. 12/03/19   Plotnikov, Evie Lacks, MD  furosemide (LASIX) 80 MG tablet Take 1 tablet (80 mg total) by mouth daily. 03/15/20   Isaiah Serge, NP  hydrocerin (EUCERIN) CREA Apply 1 application topically 2 (two) times daily. 03/14/20   Isaiah Serge, NP  HYDROcodone-homatropine [facility]) 5-1.5 MG/5ML syrup Take 5 mLs by mouth every 8 (eight) hours as needed for cough. 03/21/20   Plotnikov, Evie Lacks, MD  isosorbide mononitrate (IMDUR) 30 MG 24 hr tablet Take 1 tablet (30 mg total) by mouth daily. 03/14/20   Isaiah Serge, NP  ketoconazole (NIZORAL) 2 % cream APPLY  TOPICALLY TO THE AFFECTED AREA DAILY Patient taking differently: Apply 1 application topically daily as needed for irritation. 08/11/19   Plotnikov, Evie Lacks, MD  metoprolol tartrate (LOPRESSOR) 100 MG tablet Take 1 tablet (100 mg total) by mouth 2 (two) times daily. 03/14/20   Isaiah Serge, NP  nitroGLYCERIN (NITROSTAT) 0.4 MG SL tablet Place 1 tablet (0.4 mg total) under the tongue every 5 (five) minutes as needed for chest pain. 08/28/19 08/27/20  Kathyrn Drown D, NP  nystatin (MYCOSTATIN) 100000 UNIT/ML suspension Take 5 mLs (500,000 Units total) by mouth 4 (four) times daily. 03/15/20   Hoyt Koch, MD  pantoprazole (PROTONIX) 40 MG tablet Take 1 tablet (40 mg total) by mouth daily. 03/14/20   Isaiah Serge, NP  potassium chloride (KLOR-CON) 10 MEQ tablet Take 1 tablet (10 mEq total) by mouth daily. 03/14/20   Isaiah Serge, NP  Propylene Glycol (SYSTANE BALANCE) 0.6 % SOLN Place 1 drop into both eyes 2 (two) times daily as needed (dry eyes).    [provider]  rosuvastatin (CRESTOR) 10 MG tablet Take 1 tablet (10 mg total) by mouth daily. 03/14/20   Isaiah Serge, NP    Family History    Family History  Problem Relation Age of Onset  . Heart disease Father   . Arthritis Other        Family history of  . Coronary artery disease Other        Family history of  . Hypertension Other        Family history of  . Diabetes Daughter        40 died   She indicated that her mother is deceased. She indicated that her father is deceased. She indicated that her maternal grandmother is deceased. She indicated that her maternal grandfather is deceased. She indicated that her paternal grandmother is deceased. She indicated that her paternal grandfather is deceased. She indicated that the status of her daughter is unknown. She indicated that the status of her other is unknown.  Social History    Social History   Socioeconomic History  . Marital status: Divorced    Spouse name:  Not on file  . Number of children: 2  . Years of education: Not on file  . Highest education level: Not on file  Occupational History  . Occupation: Airline pilot: [employer]  Tobacco Use  .  Smoking status: Former Smoker    Packs/day: 1.00    Types: Cigarettes    Quit date: 06-11-08    Years since quitting: 12.0  . Smokeless tobacco: Never Used  . Tobacco comment: pt unsure how many years she smoked  Vaping Use  . Vaping Use: Never used  Substance and Sexual Activity  . Alcohol use: No  . Drug use: No  . Sexual activity: Not Currently  Other Topics Concern  . Not on file  Social History Narrative   Retired, works part time   Divorced   Former Smoker   1- Son alcoholic abusive   Daughter died in 06-12-2006   Social Determinants of Health   Financial Resource Strain: High Risk  . Difficulty of Paying Living Expenses: Very hard  Food Insecurity: Food Insecurity Present  . Worried About Charity fundraiser in the Last Year: Often true  . Ran Out of Food in the Last Year: Often true  Transportation Needs: Unmet Transportation Needs  . Lack of Transportation (Medical): Yes  . Lack of Transportation (Non-Medical): Yes  Physical Activity: Unknown  . Days of Exercise per Week: Patient refused  . Minutes of Exercise per Session: Not on file  Stress: No Stress Concern Present  . Feeling of Stress : Not at all  Social Connections: Not on file  Intimate Partner Violence: Not on file     Review of Systems    General:  No chills, fever, night sweats or weight changes.  Cardiovascular:  No chest pain, dyspnea on exertion, edema, orthopnea, palpitations, paroxysmal nocturnal dyspnea. Dermatological: No rash, lesions/masses Respiratory: No cough, dyspnea Urologic: No hematuria, dysuria Abdominal:   No nausea, vomiting, diarrhea, bright red blood per rectum, melena, or hematemesis Neurologic:  No visual changes, wkns, changes in mental status. All other systems reviewed and are  otherwise negative except as noted above.  Physical Exam    VS:  There were no vitals taken for this visit. , BMI There is no height or weight on file to calculate BMI. GEN: Well nourished, well developed, in no acute distress. HEENT: normal. Neck: Supple, no JVD, carotid bruits, or masses. Cardiac: RRR, no murmurs, rubs, or gallops. No clubbing, cyanosis, edema.  Radials/DP/PT 2+ and equal bilaterally.  Respiratory:  Respirations regular and unlabored, clear to auscultation bilaterally. GI: Soft, nontender, nondistended, BS + x 4. MS: no deformity or atrophy. Skin: warm and dry, no rash. Neuro:  Strength and sensation are intact. Psych: Normal affect.  Accessory Clinical Findings    Recent Labs: 03/08/2020: ALT 36; Magnesium 2.2; TSH 7.708 03/09/2020: Hemoglobin 12.0; Platelets 377 03/13/2020: B Natriuretic Peptide 3,757.7 03/14/2020: BUN 31; Creatinine, Ser 1.58; Potassium 4.6; Sodium 130   Recent Lipid Panel    Component Value Date/Time   CHOL 239 (H) 08/26/2019 1240   TRIG 155 (H) 08/26/2019 1240   HDL 54 08/26/2019 1240   CHOLHDL 4.4 08/26/2019 1240   VLDL 31 08/26/2019 1240   LDLCALC 154 (H) 08/26/2019 1240   LDLDIRECT 144.3 01/31/2011 0905    ECG personally reviewed by me today- *** - No acute changes  EKG 03/12/2020 Atrial fibrillation nonspecific ST abnormality 76 bpm  Echocardiogram6/23/2021 IMPRESSIONS    1. Left ventricular ejection fraction, by estimation, is 60 to 65%. The  left ventricle has normal function. Left ventricular endocardial border  not optimally defined to evaluate regional wall motion. There is mild  concentric left ventricular  hypertrophy. Left ventricular diastolic function could not be evaluated.  2. Right  ventricular systolic function is normal. The right ventricular  size is normal.  3. Left atrial size was moderately dilated.  4. The mitral valve is normal in structure. No evidence of mitral valve  regurgitation. No evidence of  mitral stenosis.  5. The aortic valve is tricuspid. Aortic valve regurgitation is mild.   Cardiac catheterization 08/26/2019  1st Diag lesion is 100% stenosed.  Balloon angioplasty was performed using a BALLOON SAPPHIRE 2.0X12.  Post intervention, there is a 50% residual stenosis.  Mid LAD lesion is 30% stenosed.   Echocardiogram 09/16/2019 IMPRESSIONS    1. Left ventricular ejection fraction, by estimation, is 50 to 55%. The  left ventricle has low normal function. The left ventricle has no regional  wall motion abnormalities. There is mild concentric left ventricular  hypertrophy. Left ventricular  diastolic function could not be evaluated.  2. Right ventricular systolic function is normal. The right ventricular  size is normal. Tricuspid regurgitation signal is inadequate for assessing  PA pressure.  3. Left atrial size was mildly dilated.  4. The mitral valve is normal in structure. Trivial mitral valve  regurgitation. No evidence of mitral stenosis.  5. The aortic valve is tricuspid. Aortic valve regurgitation is mild. No  aortic stenosis is present.  6. The inferior vena cava is dilated in size with <50% respiratory  variability, suggesting right atrial pressure of 15 mmHg.  Echocardiogram 03/10/2020 IMPRESSIONS    1. Global hypokinesis worse in inferior wall. Left ventricular ejection  fraction, by estimation, is 30 to 35%. The left ventricle has moderately  decreased function. The left ventricle demonstrates global hypokinesis.  The left ventricular internal cavity  size was moderately dilated. Left ventricular diastolic function could  not be evaluated.  2. Right ventricular systolic function is moderately reduced. The right  ventricular size is mildly enlarged.  3. Dense spontaneous contrast. 1 x 1 cm acute/chronic LAA thrombus  confirmed on orthogonal views and with definity . Left atrial size was  moderately dilated. A left atrial/left atrial  appendage thrombus was  detected.  4. Right atrial size was mildly dilated.  5. The mitral valve is degenerative. Mild mitral valve regurgitation.  Moderate mitral annular calcification.  6. The aortic valve is tricuspid. Aortic valve regurgitation is not  visualized. Mild to moderate aortic valve sclerosis/calcification is  present, without any evidence of aortic stenosis.  7. Pulmonic valve regurgitation is moderate.   Assessment & Plan   1.  PAF- EKG today shows***. Denies  episodes of palpitations. Was a direct admit from the office to the hospital 03/08/2020. She complained of dyspnea on exertion, orthopnea and increasing lower extremity edema. Her EKG showed atrial fibrillation with RVR with a rate of 108 bpm. Prior to leaving the office she was agreeable to taking anticoagulation. Her echocardiogram showed a thrombus in her left atrial appendage so DCCV was not performed. She was also noted to have mild MR, mild TR and moderate PR. She denied chest pain throughout her hospitalization. Plan was made for DCCV in 3-4 weeks with anticoagulation.    Continueapixaban, diltiazem, metoprolol, aspirin, Plavix Heart healthy low-sodium diet-salty 6 given Increase physical activity as tolerated  Acute on chronic diastolic CHF- chronic DOE.  Continues to be euvolemic today. Weight today116.4*** pounds and stable.   Echocardiogram 03/10/2020 global hypokinesis worse in inferior wall. Left ventricular ejection fraction, by estimation, is 30 to 35%..Denies dizziness, presyncope, syncope Heart healthy low-sodium diet-salty 6 given-reviewed Increase physical activity as tolerated-instructed to use walker for safety reasons,  not stability reasons.-Reviewed Continue daily weights-contact office with a weight increase of 3 pounds overnight or 5 pounds in 1 week-compliant with daily weights. Continue fluid restriction Continue apixaban, diltiazem, Imdur, metoprolol, torsemide Order BMP in 1  week  Coronary artery disease-no chest pain today.  Continueaspirin, Plavix, atorvastatin, amlodipine,metoprolol, Imdurnitroglycerin Increase metoprolol to 25 mg twice daily Heart healthy low-sodium diet-salty 6 given Increase physical activity as tolerated   Essential hypertension-BP today***130/70.Well-controlled at home. Holding ACE/ARB due to renal function. Continueamlodipine,Continuemetoprolol  Heart healthy low-sodium diet-salty 6 given Increase physical activity as tolerated  Hyperlipidemia-goal less than 70. Low-dose rosuvastatin prescribed due to advanced age. Continue rosuvastatin 10 mg daily Heart healthy low-sodium high-fiber diet Increase physical activity as tolerated  COPD with chronic bronchitis-no increased work of breathing but continues to be chronic in nature. ***Continues tonot use home oxygen.States that she does not use home oxygen but was prescribed this. 97% on room air Follow-up with pulmonary/PCP  Disposition: Follow-up withDr. Stanford Breed or me in 2 weeks and Dr. Stanford Breed in 2 months.   Jossie Ng. Benedicta Sultan NP-C    03/22/2020, 7:07 AM [address] Office [phone] Fax [phone]  Notice: This dictation was prepared with Dragon dictation along with smaller phrase technology. Any transcriptional errors that result from this process are unintentional and may not be corrected upon review.  I spent***minutes examining this patient, reviewing medications, and using patient centered shared decision making involving her cardiac care.  Prior to her visit I spent greater than 20 minutes reviewing her past medical history,  medications, and prior cardiac tests.

## 2020-03-23 ENCOUNTER — Telehealth: Payer: Self-pay | Admitting: Cardiology

## 2020-03-23 DIAGNOSIS — I252 Old myocardial infarction: Secondary | ICD-10-CM | POA: Diagnosis not present

## 2020-03-23 DIAGNOSIS — N179 Acute kidney failure, unspecified: Secondary | ICD-10-CM | POA: Diagnosis not present

## 2020-03-23 DIAGNOSIS — I2510 Atherosclerotic heart disease of native coronary artery without angina pectoris: Secondary | ICD-10-CM | POA: Diagnosis not present

## 2020-03-23 DIAGNOSIS — M19042 Primary osteoarthritis, left hand: Secondary | ICD-10-CM | POA: Diagnosis not present

## 2020-03-23 DIAGNOSIS — I480 Paroxysmal atrial fibrillation: Secondary | ICD-10-CM | POA: Diagnosis not present

## 2020-03-23 DIAGNOSIS — Z7952 Long term (current) use of systemic steroids: Secondary | ICD-10-CM | POA: Diagnosis not present

## 2020-03-23 DIAGNOSIS — R29898 Other symptoms and signs involving the musculoskeletal system: Secondary | ICD-10-CM | POA: Diagnosis not present

## 2020-03-23 DIAGNOSIS — N183 Chronic kidney disease, stage 3 unspecified: Secondary | ICD-10-CM | POA: Diagnosis not present

## 2020-03-23 DIAGNOSIS — Z9181 History of falling: Secondary | ICD-10-CM | POA: Diagnosis not present

## 2020-03-23 DIAGNOSIS — Z7902 Long term (current) use of antithrombotics/antiplatelets: Secondary | ICD-10-CM | POA: Diagnosis not present

## 2020-03-23 DIAGNOSIS — E538 Deficiency of other specified B group vitamins: Secondary | ICD-10-CM | POA: Diagnosis not present

## 2020-03-23 DIAGNOSIS — M19041 Primary osteoarthritis, right hand: Secondary | ICD-10-CM | POA: Diagnosis not present

## 2020-03-23 DIAGNOSIS — Z79899 Other long term (current) drug therapy: Secondary | ICD-10-CM

## 2020-03-23 DIAGNOSIS — I130 Hypertensive heart and chronic kidney disease with heart failure and stage 1 through stage 4 chronic kidney disease, or unspecified chronic kidney disease: Secondary | ICD-10-CM | POA: Diagnosis not present

## 2020-03-23 DIAGNOSIS — E559 Vitamin D deficiency, unspecified: Secondary | ICD-10-CM | POA: Diagnosis not present

## 2020-03-23 DIAGNOSIS — J449 Chronic obstructive pulmonary disease, unspecified: Secondary | ICD-10-CM | POA: Diagnosis not present

## 2020-03-23 DIAGNOSIS — R42 Dizziness and giddiness: Secondary | ICD-10-CM | POA: Diagnosis not present

## 2020-03-23 DIAGNOSIS — R634 Abnormal weight loss: Secondary | ICD-10-CM | POA: Diagnosis not present

## 2020-03-23 DIAGNOSIS — Z87891 Personal history of nicotine dependence: Secondary | ICD-10-CM | POA: Diagnosis not present

## 2020-03-23 DIAGNOSIS — Z7982 Long term (current) use of aspirin: Secondary | ICD-10-CM | POA: Diagnosis not present

## 2020-03-23 DIAGNOSIS — B370 Candidal stomatitis: Secondary | ICD-10-CM | POA: Diagnosis not present

## 2020-03-23 DIAGNOSIS — F32A Depression, unspecified: Secondary | ICD-10-CM | POA: Diagnosis not present

## 2020-03-23 DIAGNOSIS — I5031 Acute diastolic (congestive) heart failure: Secondary | ICD-10-CM | POA: Diagnosis not present

## 2020-03-23 NOTE — Telephone Encounter (Signed)
DC Lasix; Demadex 40 mg twice daily; bmet 1 week; follow-up as scheduled. Kirk Ruths

## 2020-03-23 NOTE — Telephone Encounter (Signed)
Spoke with patient and relayed med change per MD. She has torsemide '20mg'$  tablets at home - she will take 2 tablets twice daily. Advised if she is unable to keep her 1/20 visit, please contact office. Advised that she get non-fasting BMET next week

## 2020-03-23 NOTE — Patient Instructions (Signed)
Goals Addressed            This Visit's Progress   . [facility] - Find Help in My Community       Timeframe:  Short-Term Goal Priority:  Medium Start Date:          03/22/2020                   Expected End Date:      04/22/2020                 Follow Up Date 04/05/2020   - follow-up on any referrals for help I am given - have a back-up plan - make a list of family or friends that I can call    Why is this important?    Knowing how and where to find help for yourself or family in your neighborhood and community is an important skill.   You will want to take some steps to learn how.    Notes:   1/17 - Referral placed to Care Guide to help with food stamp application    . [facility] - Track and Manage Fluids and Swelling-Heart Failure       Timeframe:  Short-Term Goal Priority:  High Start Date:       03/22/2020                      Expected End Date:        02/19/2021               Follow Up Date 04/05/2020    - call office if I gain more than 2 pounds in one day or 5 pounds in one week - keep legs up while sitting - track weight in diary - use salt in moderation - watch for swelling in feet, ankles and legs every day - weigh myself daily    Why is this important?    It is important to check your weight daily and watch how much salt and liquids you have.   It will help you to manage your heart failure.    Notes:     . [facility] - Track and Manage Heart Rate and Rhythm-Atrial Fibrillation       Timeframe:  Long-Range Goal Priority:  Medium Start Date:       03/22/2020                      Expected End Date:      05/20/2020                 Follow Up Date 04/05/2020    - bring symptom diary to all appointments - check pulse (heart) rate before taking medicine - check pulse (heart) rate once a day - make a plan to eat healthy - keep all lab appointments    Why is this important?    Atrial fibrillation may have no symptoms. Sometimes the symptoms get worse or happen more often.    It is important to keep track of what your symptoms are and when they happen.   A change in symptoms is important to discuss with your doctor or nurse.   Being active and healthy eating will also help you manage your heart condition.     Notes:        Atrial Fibrillation  Atrial fibrillation is a type of heartbeat that is irregular or fast. If you have this condition, your heart  beats without any order. This makes it hard for your heart to pump blood in a normal way. Atrial fibrillation may come and go, or it may become a long-lasting problem. If this condition is not treated, it can put you at higher risk for stroke, heart failure, and other heart problems. What are the causes? This condition may be caused by diseases that damage the heart. They include:  High blood pressure.  Heart failure.  Heart valve disease.  Heart surgery. Other causes include:  Diabetes.  Thyroid disease.  Being overweight.  Kidney disease. Sometimes the cause is not known. What increases the risk? You are more likely to develop this condition if:  You are older.  You smoke.  You exercise often and very hard.  You have a family history of this condition.  You are a man.  You use drugs.  You drink a lot of alcohol.  You have lung conditions, such as emphysema, pneumonia, or COPD.  You have sleep apnea. What are the signs or symptoms? Common symptoms of this condition include:  A feeling that your heart is beating very fast.  Chest pain or discomfort.  Feeling short of breath.  Suddenly feeling light-headed or weak.  Getting tired easily during activity.  Fainting.  Sweating. In some cases, there are no symptoms. How is this treated? Treatment for this condition depends on underlying conditions and how you feel when you have atrial fibrillation. They include:  Medicines to: ? Prevent blood clots. ? Treat heart rate or heart rhythm problems.  Using devices, such  as a pacemaker, to correct heart rhythm problems.  Doing surgery to remove the part of the heart that sends bad signals.  Closing an area where clots can form in the heart (left atrial appendage). In some cases, your doctor will treat other underlying conditions. Follow these instructions at home: Medicines  Take over-the-counter and prescription medicines only as told by your doctor.  Do not take any new medicines without first talking to your doctor.  If you are taking blood thinners: ? Talk with your doctor before you take any medicines that have aspirin or NSAIDs, such as ibuprofen, in them. ? Take your medicine exactly as told by your doctor. Take it at the same time each day. ? Avoid activities that could hurt or bruise you. Follow instructions about how to prevent falls. ? Wear a bracelet that says you are taking blood thinners. Or, carry a card that lists what medicines you take. Lifestyle  Do not use any products that have nicotine or tobacco in them. These include cigarettes, e-cigarettes, and chewing tobacco. If you need help quitting, ask your doctor.  Eat heart-healthy foods. Talk with your doctor about the right eating plan for you.  Exercise regularly as told by your doctor.  Do not drink alcohol.  Lose weight if you are overweight.  Do not use drugs, including cannabis.      General instructions  If you have a condition that causes breathing to stop for a short period of time (apnea), treat it as told by your doctor.  Keep a healthy weight. Do not use diet pills unless your doctor says they are safe for you. Diet pills may make heart problems worse.  Keep all follow-up visits as told by your doctor. This is important. Contact a doctor if:  You notice a change in the speed, rhythm, or strength of your heartbeat.  You are taking a blood-thinning medicine and you get more bruising.  You get tired more easily when you move or exercise.  You have a sudden  change in weight. Get help right away if:  You have pain in your chest or your belly (abdomen).  You have trouble breathing.  You have side effects of blood thinners, such as blood in your vomit, poop (stool), or pee (urine), or bleeding that cannot stop.  You have any signs of a stroke. "BE FAST" is an easy way to remember the main warning signs: ? B - Balance. Signs are dizziness, sudden trouble walking, or loss of balance. ? E - Eyes. Signs are trouble seeing or a change in how you see. ? F - Face. Signs are sudden weakness or loss of feeling in the face, or the face or eyelid drooping on one side. ? A - Arms. Signs are weakness or loss of feeling in an arm. This happens suddenly and usually on one side of the body. ? S - Speech. Signs are sudden trouble speaking, slurred speech, or trouble understanding what people say. ? T - Time. Time to call emergency services. Write down what time symptoms started.  You have other signs of a stroke, such as: ? A sudden, very bad headache with no known cause. ? Feeling like you may vomit (nausea). ? Vomiting. ? A seizure. These symptoms may be an emergency. Do not wait to see if the symptoms will go away. Get medical help right away. Call your local emergency services (911 in the [place] Do not drive yourself to the hospital.   Summary  Atrial fibrillation is a type of heartbeat that is irregular or fast.  You are at higher risk of this condition if you smoke, are older, have diabetes, or are overweight.  Follow your doctor's instructions about medicines, diet, exercise, and follow-up visits.  Get help right away if you have signs or symptoms of a stroke.  Get help right away if you cannot catch your breath, or you have chest pain or discomfort. This information is not intended to replace advice given to you by your health care provider. Make sure you discuss any questions you have with your health care provider. Document Revised: 08/14/2018  Document Reviewed: 08/14/2018 Elsevier Patient Education  [facility].

## 2020-03-23 NOTE — Telephone Encounter (Signed)
Returned call to patient of Dr. Stanford Breed - she reports she is retaining fluid, in her feet up to her knees. She thinks maybe in her stomach also. She reports her breathing is "terrible"   Her PCP gave her a prescription for torsemide '100mg'$  and she states this works better than furosemide '80mg'$  daily  Patient states she is 105lbs - she is not weighing routinely   She would like to know if she can take torsemide instead of furosemide She has a post-hospital follow up with Denyse Amass NP on 1/20  Sent to Dr. Stanford Breed to review

## 2020-03-23 NOTE — Telephone Encounter (Signed)
Notified pt MD sent cough syrup to pof...[name]

## 2020-03-23 NOTE — Telephone Encounter (Signed)
° ° °  Pt would like to speak with a nurse, she said its about her medications

## 2020-03-25 ENCOUNTER — Telehealth: Payer: Self-pay | Admitting: Cardiology

## 2020-03-25 ENCOUNTER — Ambulatory Visit: Payer: Medicare Other | Admitting: General Practice

## 2020-03-25 MED ORDER — ISOSORBIDE MONONITRATE ER 30 MG PO TB24: 30.0000 mg | ORAL_TABLET | Freq: Every day | ORAL | 6 refills | Status: DC

## 2020-03-25 MED ORDER — DILTIAZEM HCL ER COATED BEADS 120 MG PO CP24: 120.0000 mg | ORAL_CAPSULE | Freq: Every day | ORAL | 6 refills | Status: DC

## 2020-03-25 MED ORDER — APIXABAN 2.5 MG PO TABS: 2.5000 mg | ORAL_TABLET | Freq: Two times a day (BID) | ORAL | 6 refills | Status: DC

## 2020-03-25 MED ORDER — ROSUVASTATIN CALCIUM 10 MG PO TABS: 10.0000 mg | ORAL_TABLET | Freq: Every day | ORAL | 3 refills | Status: DC

## 2020-03-25 MED ORDER — TORSEMIDE 20 MG PO TABS: 40.0000 mg | ORAL_TABLET | Freq: Two times a day (BID) | ORAL | 6 refills | Status: DC

## 2020-03-25 MED ORDER — POTASSIUM CHLORIDE CRYS ER 10 MEQ PO TBCR: 10.0000 meq | EXTENDED_RELEASE_TABLET | Freq: Every day | ORAL | 6 refills | Status: AC

## 2020-03-25 MED ORDER — METOPROLOL TARTRATE 100 MG PO TABS: 100.0000 mg | ORAL_TABLET | Freq: Two times a day (BID) | ORAL | 6 refills | Status: DC

## 2020-03-25 MED ORDER — PANTOPRAZOLE SODIUM 40 MG PO TBEC: 40.0000 mg | DELAYED_RELEASE_TABLET | Freq: Every day | ORAL | 6 refills | Status: AC

## 2020-03-25 NOTE — Telephone Encounter (Addendum)
Left message to call back to schedule an appt next week. Ok w/any APP [name] is Pre-op next week

## 2020-03-25 NOTE — Telephone Encounter (Signed)
New message:     Patient calling and had to cancel this morning apt. Patient is sick and need a apt next, but there is nothing for next week. Patient do not a vv apt.

## 2020-03-25 NOTE — Telephone Encounter (Signed)
Spoke to patient she stated she does not see good and thinks she may have took 2 Eliquis tablets this morning.Stated she would like medication refills sent to a pharmacy that pre packages.Advised do not take pm dose of Eliquis today.Advised take Eliquis 2.5 mg tomorrow as prescribed.Advised I will send medications ordered by Dr.Crenshaw to [facility].Once they receive prescriptions a tech will call and schedule appointment to come to your home.

## 2020-03-25 NOTE — Telephone Encounter (Signed)
° ° ° °  Pt c/o medication issue:  1. Name of Medication:   apixaban (ELIQUIS) 2.5 MG TABS tablet    2. How are you currently taking this medication (dosage and times per day)? Take 1 tablet (2.5 mg total) by mouth 2 (two) times daily.  3. Are you having a reaction (difficulty breathing--STAT)?   4. What is your medication issue? Pt said she accidentally took 2 of her Eliquis, she wanted to know if that's ok

## 2020-03-25 NOTE — Telephone Encounter (Signed)
No appropriate for VV

## 2020-03-26 DIAGNOSIS — Z7902 Long term (current) use of antithrombotics/antiplatelets: Secondary | ICD-10-CM | POA: Diagnosis not present

## 2020-03-26 DIAGNOSIS — M19042 Primary osteoarthritis, left hand: Secondary | ICD-10-CM | POA: Diagnosis not present

## 2020-03-26 DIAGNOSIS — Z87891 Personal history of nicotine dependence: Secondary | ICD-10-CM | POA: Diagnosis not present

## 2020-03-26 DIAGNOSIS — Z7982 Long term (current) use of aspirin: Secondary | ICD-10-CM | POA: Diagnosis not present

## 2020-03-26 DIAGNOSIS — Z7952 Long term (current) use of systemic steroids: Secondary | ICD-10-CM | POA: Diagnosis not present

## 2020-03-26 DIAGNOSIS — R42 Dizziness and giddiness: Secondary | ICD-10-CM | POA: Diagnosis not present

## 2020-03-26 DIAGNOSIS — M19041 Primary osteoarthritis, right hand: Secondary | ICD-10-CM | POA: Diagnosis not present

## 2020-03-26 DIAGNOSIS — Z9181 History of falling: Secondary | ICD-10-CM | POA: Diagnosis not present

## 2020-03-26 DIAGNOSIS — N179 Acute kidney failure, unspecified: Secondary | ICD-10-CM | POA: Diagnosis not present

## 2020-03-26 DIAGNOSIS — R29898 Other symptoms and signs involving the musculoskeletal system: Secondary | ICD-10-CM | POA: Diagnosis not present

## 2020-03-26 DIAGNOSIS — N183 Chronic kidney disease, stage 3 unspecified: Secondary | ICD-10-CM | POA: Diagnosis not present

## 2020-03-26 DIAGNOSIS — I130 Hypertensive heart and chronic kidney disease with heart failure and stage 1 through stage 4 chronic kidney disease, or unspecified chronic kidney disease: Secondary | ICD-10-CM | POA: Diagnosis not present

## 2020-03-26 DIAGNOSIS — E559 Vitamin D deficiency, unspecified: Secondary | ICD-10-CM | POA: Diagnosis not present

## 2020-03-26 DIAGNOSIS — B370 Candidal stomatitis: Secondary | ICD-10-CM | POA: Diagnosis not present

## 2020-03-26 DIAGNOSIS — J449 Chronic obstructive pulmonary disease, unspecified: Secondary | ICD-10-CM | POA: Diagnosis not present

## 2020-03-26 DIAGNOSIS — I2510 Atherosclerotic heart disease of native coronary artery without angina pectoris: Secondary | ICD-10-CM | POA: Diagnosis not present

## 2020-03-26 DIAGNOSIS — E538 Deficiency of other specified B group vitamins: Secondary | ICD-10-CM | POA: Diagnosis not present

## 2020-03-26 DIAGNOSIS — R634 Abnormal weight loss: Secondary | ICD-10-CM | POA: Diagnosis not present

## 2020-03-26 DIAGNOSIS — I480 Paroxysmal atrial fibrillation: Secondary | ICD-10-CM | POA: Diagnosis not present

## 2020-03-26 DIAGNOSIS — I252 Old myocardial infarction: Secondary | ICD-10-CM | POA: Diagnosis not present

## 2020-03-26 DIAGNOSIS — I5031 Acute diastolic (congestive) heart failure: Secondary | ICD-10-CM | POA: Diagnosis not present

## 2020-03-26 DIAGNOSIS — F32A Depression, unspecified: Secondary | ICD-10-CM | POA: Diagnosis not present

## 2020-03-26 NOTE — Telephone Encounter (Signed)
Left message for patient to call back and reschedule in office appointment with someone on Dr. Lonia Skinner team.

## 2020-03-29 ENCOUNTER — Institutional Professional Consult (permissible substitution): Payer: Medicare Other | Admitting: Pulmonary Disease

## 2020-03-29 DIAGNOSIS — I5041 Acute combined systolic (congestive) and diastolic (congestive) heart failure: Secondary | ICD-10-CM | POA: Diagnosis not present

## 2020-03-29 DIAGNOSIS — E875 Hyperkalemia: Secondary | ICD-10-CM | POA: Diagnosis not present

## 2020-03-29 DIAGNOSIS — D649 Anemia, unspecified: Secondary | ICD-10-CM | POA: Diagnosis not present

## 2020-03-29 DIAGNOSIS — N390 Urinary tract infection, site not specified: Secondary | ICD-10-CM | POA: Diagnosis not present

## 2020-03-29 DIAGNOSIS — R300 Dysuria: Secondary | ICD-10-CM | POA: Diagnosis not present

## 2020-03-29 DIAGNOSIS — I10 Essential (primary) hypertension: Secondary | ICD-10-CM | POA: Diagnosis not present

## 2020-03-29 DIAGNOSIS — N189 Chronic kidney disease, unspecified: Secondary | ICD-10-CM | POA: Diagnosis not present

## 2020-03-29 NOTE — Telephone Encounter (Signed)
Left message for pt to call.

## 2020-03-29 NOTE — Telephone Encounter (Signed)
Call returned to the patient. She stated that she was still having shortness of breath on exertion. She denies shortness of breath at rest. Her Torsemide was changed to 40 mg bid on 1/18. She did have her BMET done this morning by her home nurse. She stated that they are supposed to be faxed to the office.  Her current weight was 105 pounds which she stated stays that way.   She did say she has swelling bilateral. She has been advised to elevate her legs when she can and we will wait for the BMET results to check her kidney function.   She would like to increase her Torsemide to 100 mg. She has been advised to wait until further notice.   She moved her follow up to 2/17 with Coletta Memos, NP

## 2020-03-29 NOTE — Telephone Encounter (Signed)
Appt scheduled 2-17

## 2020-03-29 NOTE — Telephone Encounter (Signed)
Patient calling back in to state again that she thinks 100 mg torsemide dosage works better for her. She was unable to keep her 1/20 appointment with Denyse Amass and rescheduled for Feb 17th. Please advise   Pt c/o swelling: STAT is pt has developed SOB within 24 hours  1) How much weight have you gained and in what time span?  No   2) If swelling, where is the swelling located?  Feet and knees  3) Are you currently taking a fluid pill?  Yes   4) Are you currently SOB?  Yeah when she gets up and moving around   5) Do you have a log of your daily weights (if so, list)? No log of daily weights  6) Have you gained 3 pounds in a day or 5 pounds in a week?  n/a  7) Have you traveled recently?  No

## 2020-03-30 DIAGNOSIS — I130 Hypertensive heart and chronic kidney disease with heart failure and stage 1 through stage 4 chronic kidney disease, or unspecified chronic kidney disease: Secondary | ICD-10-CM | POA: Diagnosis not present

## 2020-03-30 DIAGNOSIS — E559 Vitamin D deficiency, unspecified: Secondary | ICD-10-CM | POA: Diagnosis not present

## 2020-03-30 DIAGNOSIS — R634 Abnormal weight loss: Secondary | ICD-10-CM | POA: Diagnosis not present

## 2020-03-30 DIAGNOSIS — N179 Acute kidney failure, unspecified: Secondary | ICD-10-CM | POA: Diagnosis not present

## 2020-03-30 DIAGNOSIS — I2510 Atherosclerotic heart disease of native coronary artery without angina pectoris: Secondary | ICD-10-CM | POA: Diagnosis not present

## 2020-03-30 DIAGNOSIS — Z87891 Personal history of nicotine dependence: Secondary | ICD-10-CM | POA: Diagnosis not present

## 2020-03-30 DIAGNOSIS — J449 Chronic obstructive pulmonary disease, unspecified: Secondary | ICD-10-CM | POA: Diagnosis not present

## 2020-03-30 DIAGNOSIS — Z7982 Long term (current) use of aspirin: Secondary | ICD-10-CM | POA: Diagnosis not present

## 2020-03-30 DIAGNOSIS — N183 Chronic kidney disease, stage 3 unspecified: Secondary | ICD-10-CM | POA: Diagnosis not present

## 2020-03-30 DIAGNOSIS — Z7902 Long term (current) use of antithrombotics/antiplatelets: Secondary | ICD-10-CM | POA: Diagnosis not present

## 2020-03-30 DIAGNOSIS — I252 Old myocardial infarction: Secondary | ICD-10-CM | POA: Diagnosis not present

## 2020-03-30 DIAGNOSIS — Z7952 Long term (current) use of systemic steroids: Secondary | ICD-10-CM | POA: Diagnosis not present

## 2020-03-30 DIAGNOSIS — M19042 Primary osteoarthritis, left hand: Secondary | ICD-10-CM | POA: Diagnosis not present

## 2020-03-30 DIAGNOSIS — I480 Paroxysmal atrial fibrillation: Secondary | ICD-10-CM | POA: Diagnosis not present

## 2020-03-30 DIAGNOSIS — B370 Candidal stomatitis: Secondary | ICD-10-CM | POA: Diagnosis not present

## 2020-03-30 DIAGNOSIS — E538 Deficiency of other specified B group vitamins: Secondary | ICD-10-CM | POA: Diagnosis not present

## 2020-03-30 DIAGNOSIS — F32A Depression, unspecified: Secondary | ICD-10-CM | POA: Diagnosis not present

## 2020-03-30 DIAGNOSIS — R42 Dizziness and giddiness: Secondary | ICD-10-CM | POA: Diagnosis not present

## 2020-03-30 DIAGNOSIS — Z9181 History of falling: Secondary | ICD-10-CM | POA: Diagnosis not present

## 2020-03-30 DIAGNOSIS — R29898 Other symptoms and signs involving the musculoskeletal system: Secondary | ICD-10-CM | POA: Diagnosis not present

## 2020-03-30 DIAGNOSIS — I5031 Acute diastolic (congestive) heart failure: Secondary | ICD-10-CM | POA: Diagnosis not present

## 2020-03-30 DIAGNOSIS — M19041 Primary osteoarthritis, right hand: Secondary | ICD-10-CM | POA: Diagnosis not present

## 2020-03-30 NOTE — Telephone Encounter (Signed)
Need results of bmet before changing demadex dose Kirk Ruths

## 2020-03-31 DIAGNOSIS — J438 Other emphysema: Secondary | ICD-10-CM | POA: Diagnosis not present

## 2020-03-31 DIAGNOSIS — J449 Chronic obstructive pulmonary disease, unspecified: Secondary | ICD-10-CM | POA: Diagnosis not present

## 2020-04-01 ENCOUNTER — Telehealth: Payer: Self-pay | Admitting: Internal Medicine

## 2020-04-01 NOTE — Telephone Encounter (Signed)
   Telephone encounter was:  Unsuccessful.  04/01/2020 Name: SHAMONI MEARS MRN: [id number] DOB: 1935-11-22  Unsuccessful outbound call made today to assist with:  Food Insecurity  Outreach Attempt:  1st Attempt  Could not leave a message due to no voicemail pick up will try to call patient within the week.  [place], Care Management Phone: [phone] Email: [email or web address]

## 2020-04-02 ENCOUNTER — Telehealth: Payer: Self-pay | Admitting: Cardiology

## 2020-04-02 NOTE — Telephone Encounter (Signed)
Spoke with patient. Patient reports she is short of breath with activity but that she can't live her life in her recliner. She reports she has swelling in her feet which has not gone down. She reports it is not worse just isn't going away. She report she is taking her torsemide '80mg'$  BID and doesn't understand why it was changed from '100mg'$  when that was working for her. Patient reports her weight is the same everyday and that she weighed 105lbs this morning. She doesn't have salt in her house. She is propping her feet up in her recliner with 2 pillows. She has a follow up on 2/22 with Coletta Memos. Will route to MD.

## 2020-04-02 NOTE — Telephone Encounter (Signed)
Pt c/o swelling: STAT is pt has developed SOB within 24 hours  1) How much weight have you gained and in what time span? Not sure   2) If swelling, where is the swelling located? Feet, Legs and Ankles   3) Are you currently taking a fluid pill? Yes   4) Are you currently SOB? States she lives with SOB, does worsen with fluid   5) Do you have a log of your daily weights (if so, list)? No   6) Have you gained 3 pounds in a day or 5 pounds in a week? No, states she is the same weight   7) Have you traveled recently? No   Anshi states she cannot put on her shoes due to swelling. Please advise.

## 2020-04-04 DIAGNOSIS — R918 Other nonspecific abnormal finding of lung field: Secondary | ICD-10-CM | POA: Diagnosis not present

## 2020-04-04 DIAGNOSIS — I509 Heart failure, unspecified: Secondary | ICD-10-CM | POA: Diagnosis not present

## 2020-04-05 ENCOUNTER — Other Ambulatory Visit: Payer: Self-pay | Admitting: *Deleted

## 2020-04-05 NOTE — Telephone Encounter (Signed)
Schedule APPov sooner [name]

## 2020-04-05 NOTE — Patient Outreach (Signed)
[facility]) Care Management  04/05/2020  [name] 02-03-1936 [id number]   Outgoing call placed to member to follow up on management of chronic conditions and to complete initial assessment.  State she is still having intermittent issues with shortness of breath.  [facility] in the home during call, state member now has pneumonia, will be placed on antibiotics.  She was able to review other medications in the home, member taking as instructed.  They will also place order to have home PT sessions to increase strength.    Member has office visits with cardiology on 2/9 and with pulmonology on 2/11.  She remains concerned that she has been taxing on her sister, depending on her for many things.  Unfortunately, there is no other family.  She feel sister is capable of transporting to MD visits, think the bigger issue is grocery shopping.  State neither she or her sister is able to carry bags in/out of the home/store.  She does not have mobile meals at this time.  Per chart, Care Guide attempted to contact member on 1/27 without success.  She state she has been having trouble with phone, request Care guide to call back.  Denies any urgent concerns, encouraged to contact this care manager with questions.    Will follow up and collaborate with Care Guide regarding community resources (transportation and food) and will follow up with member within the next month.  Denies any urgent concerns, encouraged to contact this care manager with questions.    Goals Addressed            This Visit's Progress   . [facility] - Find Help in My Community   On track    Timeframe:  Short-Term Goal Priority:  Medium Start Date:          03/22/2020                   Expected End Date:      04/22/2020                 Follow Up Date 05/04/2020   - follow-up on any referrals for help I am given - have a back-up plan - make a list of family or friends that I can call    Why is this important?     Knowing how and where to find help for yourself or family in your neighborhood and community is an important skill.   You will want to take some steps to learn how.    Notes:   1/17 - Referral placed to Care Guide to help with food stamp application  [id number] - Referral placed to [facility], most recent visit today    . [facility] - Track and Manage Fluids and Swelling-Heart Failure   On track    Timeframe:  Short-Term Goal Priority:  High Start Date:       03/22/2020                      Expected End Date:        04/22/2020               Follow Up Date 05/04/2020    - call office if I gain more than 2 pounds in one day or 5 pounds in one week - keep legs up while sitting - track weight in diary - use salt in moderation - watch for swelling in feet, ankles and legs every day -  weigh myself daily    Why is this important?    It is important to check your weight daily and watch how much salt and liquids you have.   It will help you to manage your heart failure.    Notes:   1/31 - Reminded to monitor salt levels.  Collaborate with Care Guide to provide prepared low sodium meals    . [facility] - Track and Manage Heart Rate and Rhythm-Atrial Fibrillation   On track    Timeframe:  Long-Range Goal Priority:  Medium Start Date:       03/22/2020                      Expected End Date:      05/20/2020                 Follow Up Date 05/04/2020    - bring symptom diary to all appointments - check pulse (heart) rate before taking medicine - check pulse (heart) rate once a day - make a plan to eat healthy - keep all lab appointments    Why is this important?    Atrial fibrillation may have no symptoms. Sometimes the symptoms get worse or happen more often.   It is important to keep track of what your symptoms are and when they happen.   A change in symptoms is important to discuss with your doctor or nurse.   Being active and healthy eating will also help you manage your heart condition.      Notes:   1/31 - Reminded to monitor weight, BP and HR daily      Valente David, Therapist, sports, MSN [place] [phone]

## 2020-04-05 NOTE — Telephone Encounter (Signed)
Spoke with pt, offered appointment this morning to be seen and she refused. Follow up scheduled Wednesday next week. Patient refused to go to the ER for evaluation but agreed to appointment next week.

## 2020-04-09 ENCOUNTER — Telehealth: Payer: Self-pay | Admitting: Internal Medicine

## 2020-04-09 NOTE — Telephone Encounter (Signed)
   Telephone encounter was:  Successful.  04/09/2020 Name: [name] MRN: [id number] DOB: 11-Dec-1935  [name] is a 85 y.o. year old female who is a primary care patient of Plotnikov, Evie Lacks, MD . The community resource team was consulted for assistance with [place] guide performed the following interventions: Discussed resources to assist with food insecurity. Patient stated that she has already spoken with someone regarding Meals on Wheels and they are not delivering at the moment. Informed patient that she might be eligible to receive assistance from [facility]. Completed referral for patient and sent to [employer]. Explained to patient that she will be receiving a call from the organization. Patient stated understanding. .  Follow Up Plan:  Care guide will follow up with patient by phone over the next week.  [place], Care Management Phone: [phone] Email: [email or web address]

## 2020-04-11 NOTE — Progress Notes (Deleted)
Cardiology Office Note:    Date:  04/11/2020   ID:  [name], DOB 10-21-1935, MRN [id number]  PCP:  Stephanie Anger, MD  Cardiologist:  Stephanie Ruths, MD  Electrophysiologist:  None   Referring MD: Stephanie Anger, MD   Chief Complaint: hospital follow-up for CHF and atrial fibrillation  History of Present Illness:    [name] is a 85 y.o. female with a history of CAD s/p balloon angioplasty to 1st Diagonal in 08/2019, chronic combined CHF with EF of 35-40%, persistent atrial fibrillation/lfutter s/p on Eliquis, COPD, hypertension, and GERD who is followed by Dr. Stanford Coffey and presents today for hospital follow-up of CHF.  A previous monitor revealed paroxysmal atrial fibrillation/flutter with RVR. However, at that time patient refused anticoagulation. Patient was admitted with an acute lateral MI in 08/2019. Cardiac catheterization at that time showed an occluded 1st Diagonal and 30% stenosis mid LAD. She underwent balloon angioplasty of Diagonal lesion due to small size of the vessel. She was readmitted in 09/2019 with hypertensive urgency and CHF. She was diuresed but creatinine increased so Lasix was discontinued. BP medications were adjusted. Echo during this admission showed LVEF of 50-55% with normal wall motion and mild LVH and mild AI. In 02/2020 patient was noted to be back in atrial fibrillation. She was not taking her diuretic at the time. Therefore, diuretic was resumed and Cardizem was added. She was last seen by Dr. Stanford Coffey on 03/08/2020 at which time she had developed progressive dyspnea on exertion (even  With just walking in the house) as well as orthopnea and worsening pedal edema. She was still in atrial fibrillation with RVR. Volume overload was felt to be secondary to atrial fibrillation. Patient finally agreed to start Eliquis and she was admitted for IV diuresis and TEE/DCCV. BNP was elevated at 3,154. Chest x-ray showed moderate bilateral pleural effusions.  She was started on IV Lasix and IV Diltiazem. Echo showed LVEF of 35-40% with global hypokinesis and mild LVH and mild to moderate MR. RV mildly elevated with moderately reduced systolic function and severely elevated PASP of 69.2 mmHg. Imdur was added and Metoprolol was restarted due to elevated BP. Patient underwent TEE on 03/10/2020 which showed a thrombus in left atrial appendage so DCCV was not performed. Patient was negative 2.9 L this admission. Discharge weight 104.96 lbs. She was discharged on Lasix '80mg'$  daily (previously on Torsemide '100mg'$  daily), Lopressor '100mg'$  twice daily, Diltiazem '120mg'$  daily, Imdur '30mg'$  daily, Eliquis 2.'5mg'$  twice daily. ARB not added due to renal function. Aspirin and Plavix were discontinued on admission due to need for anticoagulation. She desatted to 88% while walking on room air so she was discharged with home O2. Plan was for outpatient DCCV after 3-4 weeks of uninterrupted anticoagulation.   She has called our office multiple times since discharge with a variety of complaints included dizziness, elevated, heart rates, increased shortness of breath, and worsening edema. During one of these phone calls, she was advised to go to the ED due to sounding audible short of breath on the phone but refused. Lasix was ultimately stopped and she was transitioned back to Torsemide at a lower dose of '40mg'$  twice daily. She has expressed frustration multiple times over why she cannot go back on Torsemide '100mg'$  daily. However, we were waiting for BMET results from home health RN.  Patient presents today for follow-up. ***  Combined Chronic CHF - Patient recently admitted with acute CHF felt to be secondary to atrial fibrillation  with RVR. - Echo during recent admission showed  35-40% with global hypokinesis and mild LVH and mild to moderate MR. RV mildly elevated with moderately reduced systolic function and severely elevated PASP of 69.2 mmHg.  - *** - Torsemide *** - No ACEi/ARB due  to renal function. *** - Continue Lopressor '100mg'$  twice daily. *** - Continue Imdur '30mg'$  daily. - Discussed importance of daily weights and sodium/fluid restrictions.  Persisted Atrial Fibrillation  History Paroxysmal Atrial Flutter - TEE during recent admission showed thrombus in left atrial appendage so DCCV was not performed. Plan was for outpatient DCCV in 3-4 weeks after uninterrupted anticoagulation. - *** - Continue Lopressor '100mg'$  twice daily. - Currently on Diltiazem '120mg'$  daily as well. This is not ideal given reduced EF but was continued during recent admission due to need for rate control.  - Continue Eliquis 2.'5mg'$  twice daily.  -   CAD - S/p balloon angioplasty to 1st Diag in 08/2019.  - No angina.  - No aspirin due to need for anticoagulation. - Continue beta-blocker and high-intensity statin.  Hypertension  Hyperlipidemia - Continue Crestor '10mg'$  daily.  CKD Stage III - Creatinine 1.58 on recent discharge. Baseline 1.3 to 1.8. - ***   Past Medical History:  Diagnosis Date  . Anxiety   . Arthritis   . Atrial fibrillation ([place])   . B12 deficiency   . COPD (chronic obstructive pulmonary disease) ([place])   . Depression   . GERD (gastroesophageal reflux disease)   . Hypertension   . Hypoxia with need for home oxygen  03/14/2020  . Mural thrombus of left atrium 03/09/20 03/14/2020  . Osteoarthrosis, hand    both hands  . Peptic ulcer, unspecified site, unspecified as acute or chronic, without mention of hemorrhage, perforation, or obstruction   . Pneumonia   . PONV (postoperative nausea and vomiting)   . Vitamin D deficiency disease     Past Surgical History:  Procedure Laterality Date  . BLADDER SURGERY     Bladder tack and intestines  . CORONARY/GRAFT ACUTE MI REVASCULARIZATION N/A 08/26/2019   Procedure: Coronary/Graft Acute MI Revascularization;  Surgeon: Sherren Mocha, MD;  Location: [facility];  Service: Cardiovascular;  Laterality: N/A;  .  CYSTOCELE REPAIR    . CYSTOSCOPY WITH RETROGRADE PYELOGRAM, URETEROSCOPY AND STENT PLACEMENT Right 09/11/2016   Procedure: CYSTOSCOPY WITH RETROGRADE PYELOGRAM, URETEROSCOPY AND STENT REPLACEMENT;  Surgeon: Cleon Gustin, MD;  Location: [facility];  Service: Urology;  Laterality: Right;  . CYSTOSCOPY WITH STENT PLACEMENT Right 08/11/2016   Procedure: CYSTOSCOPY, URETERSCOPY,  RIGHT RETROGRADE WITH RIGHT STENT PLACEMENT;  Surgeon: Festus Aloe, MD;  Location: [facility];  Service: Urology;  Laterality: Right;  . SPLENECTOMY    . TEE WITHOUT CARDIOVERSION N/A 03/10/2020   Procedure: TRANSESOPHAGEAL ECHOCARDIOGRAM (TEE);  Surgeon: Josue Hector, MD;  Location: [facility];  Service: Cardiovascular;  Laterality: N/A;    Current Medications: No outpatient medications have been marked as taking for the 04/14/20 encounter (Appointment) with Darreld Mclean, PA-C.     Allergies:   Amoxicillin, Codeine, Levaquin [levofloxacin in d5w], and Other   Social History   Socioeconomic History  . Marital status: Divorced    Spouse name: Not on file  . Number of children: 2  . Years of education: Not on file  . Highest education level: Not on file  Occupational History  . Occupation: Airline pilot: [employer]  Tobacco Use  . Smoking status: Former Smoker    Packs/day:  1.00    Types: Cigarettes    Quit date: 06/02/2008    Years since quitting: 12.1  . Smokeless tobacco: Never Used  . Tobacco comment: pt unsure how many years she smoked  Vaping Use  . Vaping Use: Never used  Substance and Sexual Activity  . Alcohol use: No  . Drug use: No  . Sexual activity: Not Currently  Other Topics Concern  . Not on file  Social History Narrative   Retired, works part time   Divorced   Former Smoker   1- Son alcoholic abusive   Daughter died in 06-03-06   Social Determinants of Health   Financial Resource Strain: High Risk  . Difficulty of Paying Living Expenses: Very hard  Food Insecurity: Food  Insecurity Present  . Worried About Charity fundraiser in the Last Year: Sometimes true  . Ran Out of Food in the Last Year: Sometimes true  Transportation Needs: No Transportation Needs  . Lack of Transportation (Medical): No  . Lack of Transportation (Non-Medical): No  Physical Activity: Unknown  . Days of Exercise per Week: Patient refused  . Minutes of Exercise per Session: Not on file  Stress: No Stress Concern Present  . Feeling of Stress : Not at all  Social Connections: Not on file     Family History: The patient's ***family history includes Arthritis in an other family member; Coronary artery disease in an other family member; Diabetes in her daughter; Heart disease in her father; Hypertension in an other family member.  ROS:   Please see the history of present illness.    *** All other systems reviewed and are negative.  EKGs/Labs/Other Studies Reviewed:    The following studies were reviewed today:  Cardiac Catheterization 08/26/2019:  1st Diag lesion is 100% stenosed.  Balloon angioplasty was performed using a BALLOON SAPPHIRE 2.0X12.  Post intervention, there is a 50% residual stenosis.  Mid LAD lesion is 30% stenosed.   1.  Total occlusion of the superior branch of the first diagonal with contrast staining, consistent with infarct vessel, treated with balloon angioplasty alone due to small vessel size and poor distal runoff 2.  Calcified coronary arteries with no significant obstructive disease in the left main, left circumflex, LAD proper, or RCA 3.  Minimally elevated LVEDP  Plan: ICU care, add clopidogrel when nausea subsides, risk reduction measures as tolerated.  Unclear whether this is a de novo plaque rupture versus an embolic event.  Patient does have known paroxysmal atrial fibrillation and has refused anticoagulation. _______________  TTE 03/09/2020: Impressions: 1. Left ventricular ejection fraction, by estimation, is 35 to 40%. The  left ventricle  has moderately decreased function. The left ventricle  demonstrates global hypokinesis. There is mild concentric left ventricular  hypertrophy. Left ventricular  diastolic function could not be evaluated.  2. Right ventricular systolic function is moderately reduced. The right  ventricular size is mildly enlarged. There is severely elevated pulmonary  artery systolic pressure. The estimated right ventricular systolic  pressure is 123456 mmHg.  3. Left atrial size was mild to moderately dilated.  4. Right atrial size was mildly dilated.  5. The mitral valve is grossly normal. Mild to moderate mitral valve  regurgitation. No evidence of mitral stenosis.  6. The aortic valve is tricuspid. There is mild calcification of the  aortic valve. There is mild thickening of the aortic valve. Aortic valve  regurgitation is mild. Mild aortic valve sclerosis is present, with no  evidence of aortic  valve stenosis.  7. The inferior vena cava is dilated in size with <50% respiratory  variability, suggesting right atrial pressure of 15 mmHg.   Comparison(s): Changes from prior study are noted. EF now moderately  reduced 35-40%. Global HK. RVSP elevated ~69 mmHG.  _______________  TEE 03/10/2020: Impressions: 1. Global hypokinesis worse in inferior wall. Left ventricular ejection  fraction, by estimation, is 30 to 35%. The left ventricle has moderately  decreased function. The left ventricle demonstrates global hypokinesis.  The left ventricular internal cavity  size was moderately dilated. Left ventricular diastolic function could  not be evaluated.  2. Right ventricular systolic function is moderately reduced. The right  ventricular size is mildly enlarged.  3. Dense spontaneous contrast. 1 x 1 cm acute/chronic LAA thrombus  confirmed on orthogonal views and with definity . Left atrial size was  moderately dilated. A left atrial/left atrial appendage thrombus was  detected.  4. Right atrial  size was mildly dilated.  5. The mitral valve is degenerative. Mild mitral valve regurgitation.  Moderate mitral annular calcification.  6. The aortic valve is tricuspid. Aortic valve regurgitation is not  visualized. Mild to moderate aortic valve sclerosis/calcification is  present, without any evidence of aortic stenosis.  7. Pulmonic valve regurgitation is moderate.    EKG:  EKG ordered today. EKG personally reviewed and demonstrates ***.  Recent Labs: 03/08/2020: ALT 36; Magnesium 2.2; TSH 7.708 03/09/2020: Hemoglobin 12.0; Platelets 377 03/13/2020: B Natriuretic Peptide 3,757.7 03/14/2020: BUN 31; Creatinine, Ser 1.58; Potassium 4.6; Sodium 130  Recent Lipid Panel    Component Value Date/Time   CHOL 239 (H) 08/26/2019 1240   TRIG 155 (H) 08/26/2019 1240   HDL 54 08/26/2019 1240   CHOLHDL 4.4 08/26/2019 1240   VLDL 31 08/26/2019 1240   LDLCALC 154 (H) 08/26/2019 1240   LDLDIRECT 144.3 01/31/2011 0905    Physical Exam:    Vital Signs: There were no vitals taken for this visit.    Wt Readings from Last 3 Encounters:  03/14/20 104 lb 14.4 oz (47.6 kg)  03/08/20 107 lb (48.5 kg)  02/13/20 116 lb 6.4 oz (52.8 kg)     General: 85 y.o. female in no acute distress. HEENT: Normocephalic and atraumatic. Sclera clear. EOMs intact. Neck: Supple. No carotid bruits. No JVD. Heart: *** RRR. Distinct S1 and S2. No murmurs, gallops, or rubs. Radial and distal pedal pulses 2+ and equal bilaterally. Lungs: No increased work of breathing. Clear to ausculation bilaterally. No wheezes, rhonchi, or rales.  Abdomen: Soft, non-distended, and non-tender to palpation. Bowel sounds present in all 4 quadrants.  MSK: Normal strength and tone for age. *** Extremities: No lower extremity edema.    Skin: Warm and dry. Neuro: Alert and oriented x3. No focal deficits. Psych: Normal affect. Responds appropriately.   Assessment:    No diagnosis found.  Plan:     Disposition: Follow up in  ***   Medication Adjustments/Labs and Tests Ordered: Current medicines are reviewed at length with the patient today.  Concerns regarding medicines are outlined above.  No orders of the defined types were placed in this encounter.  No orders of the defined types were placed in this encounter.   There are no Patient Instructions on file for this visit.   Signed, Darreld Mclean, PA-C  04/11/2020 4:07 PM     [facility]

## 2020-04-12 ENCOUNTER — Telehealth: Payer: Self-pay | Admitting: Cardiology

## 2020-04-12 DIAGNOSIS — I5031 Acute diastolic (congestive) heart failure: Secondary | ICD-10-CM

## 2020-04-12 NOTE — Telephone Encounter (Signed)
Spoke with pt, we received lab work from 03/29/20 and the patients potassium level is 3.1. per dr Stanford Breed the patient needs to take 20 meq of potassium daily. She is currently taking that dose by her report. She also ask about help with getting her oxygen that she can bring out of the house. She can not pick up the tank she has a home. Called and spoke with [facility] at [phone]. Order for portable oxygen faxed to [facility] at [phone].

## 2020-04-13 ENCOUNTER — Telehealth: Payer: Self-pay | Admitting: Pulmonary Disease

## 2020-04-13 NOTE — Telephone Encounter (Signed)
Spoke with patient. She stated that she was diagnosed with PNA on 04/05/20. She had a mobile xray done then and it showed PNA. She has been placed on doxycycline. She is still not feeling well. She's on 3L of O2 24/7 and still feels like she can't catch her breath at times. She is still using her Trelegy inhaler once a day. I advised her if she is still feeling SOB with the oxygen, she may need to call 911. She verbalized understanding.   She's worried about her O2. She stated that cardiology is trying to get her setup with [employer] but she needs to come in and fill out paperwork. She is not feeling well for this. I did ensure her that if she comes to the office, we can get put her on one of our tanks.   I advised her that since it was Tuesday to keep her appointment and I will document in her chart for [place] to reach out to her on Thursday to see how she is feeling. I will also send a reminder to myself for Thursday to make sure she is contacted. She is aware of this.   Will keep encounter open for follow up.

## 2020-04-13 NOTE — Telephone Encounter (Signed)
Patient states she was told the only way for her to get the portable oxygen is to go to the [facility] and fill out paperwork. She states that is the whole reason for the portable oxygen, because she cannot get there.

## 2020-04-13 NOTE — Telephone Encounter (Signed)
Called [facility] to discuss:  Transferred to respiratory department, lmtcb

## 2020-04-14 ENCOUNTER — Ambulatory Visit: Payer: Medicare Other | Admitting: Student

## 2020-04-14 DIAGNOSIS — R1031 Right lower quadrant pain: Secondary | ICD-10-CM | POA: Diagnosis not present

## 2020-04-14 NOTE — Telephone Encounter (Signed)
   Telephone encounter was:  Successful.  04/14/2020 Name: [name] MRN: [id number] DOB: 01/31/1936  [name] is a 85 y.o. year old female who is a primary care patient of Plotnikov, Evie Lacks, MD . The community resource team was consulted for assistance with [place] guide performed the following interventions: Spoke with patient today regarding referral. Paitent stated that  somone from [facility] gave her a call and she is supposed to be receiving a call back. Asked patient again if she would like a list of food pantries sent to her and the patient declined. Patient also had questions about [facility] that provided her oxygen tank. Informed patient that she will need to give them a call to discuss her needs. Patient stated understanding. No additonal needs at this time.  .  Follow Up Plan:  No further follow up planned at this time. The patient has been provided with needed resources. Referral will be closed.   [place], Care Management Phone: [phone] Email: [email or web address]

## 2020-04-15 ENCOUNTER — Telehealth: Payer: Self-pay | Admitting: Cardiology

## 2020-04-15 NOTE — Telephone Encounter (Signed)
Called patient to check on her, she did not answer. I left a VM for her to call back.

## 2020-04-15 NOTE — Telephone Encounter (Signed)
Follow Up:     Pt is returning  [name] call from today

## 2020-04-15 NOTE — Telephone Encounter (Signed)
The patient called in for an update on her portable oxygen. She has been made aware that the prescription was faxed a few days ago.  Call placed to [facility]. They have received the prescrption and are working on it. They are in process of trying to schedule a home visit.

## 2020-04-16 ENCOUNTER — Encounter: Payer: Self-pay | Admitting: Pulmonary Disease

## 2020-04-16 ENCOUNTER — Other Ambulatory Visit: Payer: Self-pay

## 2020-04-16 ENCOUNTER — Ambulatory Visit: Payer: Medicare Other | Admitting: Pulmonary Disease

## 2020-04-16 VITALS — BP 126/80 | HR 93 | Temp 97.4°F | Ht 64.0 in | Wt 106.0 lb

## 2020-04-16 DIAGNOSIS — Z7189 Other specified counseling: Secondary | ICD-10-CM | POA: Diagnosis not present

## 2020-04-16 DIAGNOSIS — J438 Other emphysema: Secondary | ICD-10-CM

## 2020-04-16 DIAGNOSIS — J9611 Chronic respiratory failure with hypoxia: Secondary | ICD-10-CM

## 2020-04-16 MED ORDER — LEVALBUTEROL TARTRATE 45 MCG/ACT IN AERO: 2.0000 | INHALATION_SPRAY | RESPIRATORY_TRACT | 12 refills | Status: AC | PRN

## 2020-04-16 NOTE — Progress Notes (Signed)
Subjective:   PATIENT ID: [name] GENDER: female DOB: [birth year], MRN: [id number]   HPI  Chief Complaint  Patient presents with  . Consult    PNA 04/05/2020. Still having SOB. Trelegey not working. 3 L of O2 at night, not helping.  DME-adapt.     Reason for Visit: New consult for shortness of breath  [name] is a 85 year old female never smoker with COPD, chronic hypoxemic respiratory failure, atrial fibrillation who presents as a new consult  for shortness of breath.  She was previously seen in the [facility] by Dr. Chase Caller one time in 2018. Her last visit was with NP Groce on 01/11/2017 for emphysema seen on CT.  She currently is on Trelegy.  Of note she was recently hospitalized from 1/3 to 03/14/2020 for acute combined systolic and diastolic heart failure requiring IV Lasix and IV diltiazem. Since discharge she was diagnosed with pneumonia on 04/05/2020 where she had a mobile x-ray completed.  She was started on doxycycline which she doesn't feel like it helped. Her breathing is still difficult and feel like she is rattling. Denies fevers, chills. Denies cough, sputum production or wheezing. Shortness of breath occurs with short distances. Rest improves it.   Social History: Unsure of # of years smoking but daughter reports most of her life We need to celebrate her 47th birthday together. That is her goal  I have personally reviewed patient's past medical/family/social history, allergies, current medications.  Past Medical History:  Diagnosis Date  . Anxiety   . Arthritis   . Atrial fibrillation ([place])   . B12 deficiency   . COPD (chronic obstructive pulmonary disease) ([place])   . Depression   . GERD (gastroesophageal reflux disease)   . Hypertension   . Hypoxia with need for home oxygen  03/14/2020  . [place] thrombus of left atrium 03/09/20 03/14/2020  . Osteoarthrosis, hand    both hands  . Peptic ulcer, unspecified site, unspecified as acute or chronic,  without mention of hemorrhage, perforation, or obstruction   . Pneumonia   . PONV (postoperative nausea and vomiting)   . Vitamin D deficiency disease      Family History  Problem Relation Age of Onset  . Heart disease Father   . Arthritis Other        Family history of  . Coronary artery disease Other        Family history of  . Hypertension Other        Family history of  . Diabetes Daughter        10 died     Social History   Occupational History  . Occupation: Airline pilot: [employer]  Tobacco Use  . Smoking status: Former Smoker    Packs/day: 1.00    Types: Cigarettes    Quit date: 2010    Years since quitting: 12.1  . Smokeless tobacco: Never Used  . Tobacco comment: Pt unsure how many years she has been smoking   Vaping Use  . Vaping Use: Never used  Substance and Sexual Activity  . Alcohol use: No  . Drug use: No  . Sexual activity: Not Currently    Allergies  Allergen Reactions  . Amoxicillin Other (See Comments)    "makes me crazy" Has patient had a PCN reaction causing immediate rash, facial/tongue/throat swelling, SOB or lightheadedness with hypotension: No Has patient had a PCN reaction causing severe rash involving mucus membranes or skin necrosis: No Has patient  had a PCN reaction that required hospitalization: No Has patient had a PCN reaction occurring within the last 10 years: No If all of the above answers are "NO", then may proceed with Cephalosporin use.   . Codeine Nausea And Vomiting  . Levaquin [Levofloxacin In D5w] Nausea And Vomiting  . Other Nausea And Vomiting    Pt does not want to take any pain meds     Outpatient Medications Prior to Visit  Medication Sig Dispense Refill  . apixaban (ELIQUIS) 2.5 MG TABS tablet Take 1 tablet (2.5 mg total) by mouth 2 (two) times daily. 60 tablet 6  . Biotin 1000 MCG tablet Take 1,000 mcg by mouth daily.    . Cholecalciferol 25 MCG (1000 UT) capsule Take 1,000 Units by mouth daily.    .  clonazePAM (KLONOPIN) 0.5 MG tablet Take 1 tablet (0.5 mg total) by mouth at bedtime as needed (leg cramps). TAKE 1 TABLET BY MOUTH AT BEDTIME AS NEEDED FOR LEG CRAMPS    . cyanocobalamin 1000 MCG tablet Take 1,000 mcg by mouth daily.    Marland Kitchen diltiazem (CARDIZEM CD) 120 MG 24 hr capsule Take 1 capsule (120 mg total) by mouth daily. 30 capsule 6  . docusate sodium (COLACE) 100 MG capsule Take 1 capsule (100 mg total) by mouth daily as needed for mild constipation.    . Fluticasone-Umeclidin-Vilant (TRELEGY ELLIPTA) 100-62.5-25 MCG/INH AEPB Inhale 1 Inhaler into the lungs daily. 3 each 3  . hydrocerin (EUCERIN) CREA Apply 1 application topically 2 (two) times daily.  0  . isosorbide mononitrate (IMDUR) 30 MG 24 hr tablet Take 1 tablet (30 mg total) by mouth daily. 30 tablet 6  . ketoconazole (NIZORAL) 2 % cream APPLY TOPICALLY TO THE AFFECTED AREA DAILY (Patient taking differently: Apply 1 application topically daily as needed for irritation.) 45 g 1  . metoprolol tartrate (LOPRESSOR) 100 MG tablet Take 1 tablet (100 mg total) by mouth 2 (two) times daily. 60 tablet 6  . pantoprazole (PROTONIX) 40 MG tablet Take 1 tablet (40 mg total) by mouth daily. 30 tablet 6  . potassium chloride (KLOR-CON) 10 MEQ tablet Take 1 tablet (10 mEq total) by mouth daily. 30 tablet 6  . Propylene Glycol (SYSTANE BALANCE) 0.6 % SOLN Place 1 drop into both eyes 2 (two) times daily as needed (dry eyes).    . rosuvastatin (CRESTOR) 10 MG tablet Take 1 tablet (10 mg total) by mouth daily. 90 tablet 3  . torsemide (DEMADEX) 20 MG tablet Take 2 tablets (40 mg total) by mouth 2 (two) times daily. 60 tablet 6  . acetaminophen (TYLENOL) 325 MG tablet Take 2 tablets (650 mg total) by mouth every 4 (four) hours as needed for headache or mild pain. (Patient not taking: No sig reported)    . HYDROcodone-homatropine (HYCODAN) 5-1.5 MG/5ML syrup Take 5 mLs by mouth every 8 (eight) hours as needed for cough. (Patient not taking: Reported on  04/16/2020) 120 mL 0  . nitroGLYCERIN (NITROSTAT) 0.4 MG SL tablet Place 1 tablet (0.4 mg total) under the tongue every 5 (five) minutes as needed for chest pain. (Patient not taking: Reported on 04/16/2020) 25 tablet 0  . nystatin (MYCOSTATIN) 100000 UNIT/ML suspension Take 5 mLs (500,000 Units total) by mouth 4 (four) times daily. (Patient not taking: Reported on 04/16/2020) 60 mL 0   No facility-administered medications prior to visit.    Review of Systems  Constitutional: Negative for chills, diaphoresis, fever, malaise/fatigue and weight loss.  HENT: Negative for  congestion, ear pain and sore throat.   Respiratory: Positive for sputum production and shortness of breath. Negative for cough, hemoptysis and wheezing.   Cardiovascular: Negative for chest pain, palpitations and leg swelling.  Gastrointestinal: Negative for abdominal pain, heartburn and nausea.  Genitourinary: Negative for frequency.  Musculoskeletal: Negative for joint pain and myalgias.  Skin: Negative for itching and rash.  Neurological: Negative for dizziness, weakness and headaches.  Endo/Heme/Allergies: Does not bruise/bleed easily.  Psychiatric/Behavioral: Negative for depression. The patient is not nervous/anxious.      Objective:   Vitals:   04/16/20 1325  BP: 126/80  Pulse: 93  Temp: (!) 97.4 F (36.3 C)  SpO2: 93%  Weight: 106 lb (48.1 kg)  Height: '5\' 4"'$  (1.626 m)   SpO2: 93 % O2 Device: None (Room air)  Physical Exam: General: Elderly chronically ill-appearing, no acute distress HENT: [place], AT, OP clear, MMM Eyes: EOMI, no scleral icterus Respiratory: Clear to auscultation bilaterally.  No crackles, wheezing or rales Cardiovascular: RRR, -M/R/G, no JVD Extremities:-Edema,-tenderness Neuro: AAO x4, CNII-XII grossly intact Skin: Intact, no rashes or bruising Psych: Normal mood, normal affect  Data Reviewed:  Imaging: CXR 03/08/20 - Moderate bilateral pleural effusions, atelectasis  CT Chest  01/01/17 - Severe centrilobular emphysema, diffuse bronchial wall thickening, biapical calcifications present since 2007 and unchanged, LLL scarring  PFT: None on file  Labs: CBC    Component Value Date/Time   WBC 10.7 (H) 03/09/2020 0524   RBC 4.52 03/09/2020 0524   HGB 12.0 03/09/2020 0524   HCT 37.7 03/09/2020 0524   PLT 377 03/09/2020 0524   MCV 83.4 03/09/2020 0524   MCV 87.9 08/10/2016 1551   MCH 26.5 03/09/2020 0524   MCHC 31.8 03/09/2020 0524   RDW 16.2 (H) 03/09/2020 0524   LYMPHSABS 1.9 03/08/2020 1900   MONOABS 1.3 (H) 03/08/2020 1900   EOSABS 0.1 03/08/2020 1900   BASOSABS 0.0 03/08/2020 1900   Imaging, labs and test noted above have been reviewed independently by me.    Assessment & Plan:   Discussion: 85 year old female with severe emphysema with recent hear failure followed by pneumonia in the last two months. Symptomatic. No evidence of active infection. We discussed clinical course of her COPD and compliance with inhalers. She would not benefit from macrolide therapy. We could consider pulmonary rehab in the future but she may need to start with PT referral first  COPD with emphysema --CONTINUE Trelegy ONE puff ONCE a day --START Xopenex AS NEEDED for shortness of breath or wheezing  Chronic hypoxemic respiratory failure - unable to tolerate ambulatory O2 --Wear oxygen with activity and sleep  --Goal oxygen level is >88%  Goals of Care We discussed goals of care. She states she has completed a DNR. She does not wish to of CPR or life prolonging measures such as mechanical ventilation.  Health Maintenance Immunization History  Administered Date(s) Administered  . Fluad Quad(high Dose 65+) 10/28/2018, 01/14/2020  . Influenza Split 12/05/2011  . Influenza Whole 12/21/2009, 01/24/2011  . Influenza, High Dose Seasonal PF 10/31/2016, 01/01/2018  . Influenza,inj,Quad PF,6+ Mos 11/06/2012, 12/28/2014  . Influenza-Unspecified 12/04/2013, 12/19/2015  .  Pneumococcal Conjugate-13 12/28/2014  . Pneumococcal Polysaccharide-23 03/08/2009, 04/09/2018  . Tdap 03/08/2013   CT Lung Screen - not indicated  No orders of the defined types were placed in this encounter.  Meds ordered this encounter  Medications  . levalbuterol (XOPENEX HFA) 45 MCG/ACT inhaler    Sig: Inhale 2 puffs into the lungs every 4 (  four) hours as needed for wheezing or shortness of breath.    Dispense:  1 each    Refill:  12    Return in about 3 months (around 07/14/2020).  [place], MD [facility] 04/16/2020 1:43 PM  Office Number [phone]

## 2020-04-16 NOTE — Patient Instructions (Addendum)
COPD with emphysema --CONTINUE Trelegy ONE puff ONCE a day --START Xopenex AS NEEDED for shortness of breath or wheezing  Chronic hypoxemic respiratory failure --Wear oxygen with activity and sleep  --Goal oxygen level is >88%  Follow-up in 3 months

## 2020-04-19 ENCOUNTER — Other Ambulatory Visit: Payer: Self-pay | Admitting: *Deleted

## 2020-04-19 ENCOUNTER — Ambulatory Visit: Payer: Medicare Other | Admitting: General Practice

## 2020-04-19 NOTE — Patient Outreach (Signed)
[facility]) Care Management  04/19/2020  [name] [birth year] [id number]   Patient is being transitioned to Chronic Care Management with the primary provider office, case closed.  Valente David, [place], MSN [place] [phone]

## 2020-04-20 NOTE — Telephone Encounter (Signed)
Pt seen in office by Dr. Loanne Drilling on 2.11.22. Will close encounter.

## 2020-04-21 DIAGNOSIS — H353223 Exudative age-related macular degeneration, left eye, with inactive scar: Secondary | ICD-10-CM | POA: Diagnosis not present

## 2020-04-21 DIAGNOSIS — H35371 Puckering of macula, right eye: Secondary | ICD-10-CM | POA: Diagnosis not present

## 2020-04-21 DIAGNOSIS — H353114 Nonexudative age-related macular degeneration, right eye, advanced atrophic with subfoveal involvement: Secondary | ICD-10-CM | POA: Diagnosis not present

## 2020-04-22 ENCOUNTER — Telehealth: Payer: Self-pay | Admitting: Pulmonary Disease

## 2020-04-22 ENCOUNTER — Telehealth: Payer: Self-pay | Admitting: Cardiology

## 2020-04-22 ENCOUNTER — Ambulatory Visit: Payer: Medicare Other | Admitting: General Practice

## 2020-04-22 NOTE — Telephone Encounter (Signed)
Spoke to patient she stated she saw a Lung Dr.last week and she told her sob was coming from her emphysema.Stated a cxr was not done.Post hospital follow up scheduled with Almyra Deforest PA 2/22 at 11:15 am.

## 2020-04-22 NOTE — Telephone Encounter (Signed)
Pt called back and she stated that she did not have a cxr at her visit with JE on 2/11.  She stated that she was told on 1/31 that she had PNA and she was given doxy to take.  She stated that she is still having a hard time catching her breath at times and she still feels SHOB.  She said that she has coughed so much that her chest and back is sore.  She wanted to know if JE feels that her emphysema is getting worse?  JE please advise. Thanks   COPD with emphysema --CONTINUE Trelegy ONE puff ONCE a day --START Xopenex AS NEEDED for shortness of breath or wheezing  Chronic hypoxemic respiratory failure --Wear oxygen with activity and sleep  --Goal oxygen level is >88%  Follow-up in 3 months

## 2020-04-22 NOTE — Telephone Encounter (Signed)
I have called and LM on VM for the pt to call back 

## 2020-04-22 NOTE — Telephone Encounter (Signed)
Called patient no answer. Left message to call back.

## 2020-04-22 NOTE — Telephone Encounter (Signed)
Pt c/o of Chest Pain: STAT if CP now or developed within 24 hours  1. Are you having CP right now? No   2. Are you experiencing any other symptoms (ex. SOB, nausea, vomiting, sweating)? SOB   3. How long have you been experiencing CP? A week   4. Is your CP continuous or coming and going? Coming and going   5. Have you taken Nitroglycerin? No   [name] is calling stating she currently has pneumonia. She states when she coughs she has pain in her chest and back. [name] has been scheduled for an appointment on 05/04/20 with Almyra Deforest. Please advise.  ?

## 2020-04-23 ENCOUNTER — Telehealth: Payer: Self-pay

## 2020-04-23 NOTE — Telephone Encounter (Signed)
Pharmacy name: [facility] requested: Xopenex HFA CMM?: yes Key: [id number] Covered alternatives: albuterol Tried and failed: albuterol Decision: sent to plan.  Sent to [place] for follow-up.

## 2020-04-26 ENCOUNTER — Encounter: Payer: Self-pay | Admitting: Pulmonary Disease

## 2020-04-26 NOTE — Telephone Encounter (Signed)
Approved on February 18 Request Reference Number: [id number].   XOPENEX HFA AER is approved through 03/05/2021.   Your patient may now fill this prescription and it will be covered.

## 2020-04-26 NOTE — Telephone Encounter (Signed)
LMTCB to check status of patient

## 2020-04-27 ENCOUNTER — Other Ambulatory Visit: Payer: Self-pay

## 2020-04-27 ENCOUNTER — Encounter: Payer: Self-pay | Admitting: Physician Assistant

## 2020-04-27 ENCOUNTER — Encounter: Payer: Medicare Other | Admitting: Primary Care

## 2020-04-27 ENCOUNTER — Ambulatory Visit: Payer: Medicare Other | Admitting: Physician Assistant

## 2020-04-27 ENCOUNTER — Telehealth: Payer: Self-pay | Admitting: Cardiology

## 2020-04-27 ENCOUNTER — Ambulatory Visit: Payer: Medicare Other | Admitting: General Practice

## 2020-04-27 VITALS — BP 142/82 | HR 77 | Ht 64.0 in | Wt 103.0 lb

## 2020-04-27 DIAGNOSIS — Z79899 Other long term (current) drug therapy: Secondary | ICD-10-CM | POA: Diagnosis not present

## 2020-04-27 DIAGNOSIS — J449 Chronic obstructive pulmonary disease, unspecified: Secondary | ICD-10-CM

## 2020-04-27 DIAGNOSIS — I4819 Other persistent atrial fibrillation: Secondary | ICD-10-CM

## 2020-04-27 DIAGNOSIS — I2510 Atherosclerotic heart disease of native coronary artery without angina pectoris: Secondary | ICD-10-CM | POA: Diagnosis not present

## 2020-04-27 DIAGNOSIS — I10 Essential (primary) hypertension: Secondary | ICD-10-CM

## 2020-04-27 DIAGNOSIS — E785 Hyperlipidemia, unspecified: Secondary | ICD-10-CM | POA: Diagnosis not present

## 2020-04-27 MED ORDER — AMIODARONE HCL 200 MG PO TABS: 200.0000 mg | ORAL_TABLET | Freq: Every day | ORAL | 3 refills | Status: AC

## 2020-04-27 NOTE — Progress Notes (Signed)
Cardiology Office Note:    Date:  04/28/2020   ID:  [name], DOB 1935/03/11, MRN [id number]  PCP:  Cassandria Anger, MD   [place]  Cardiologist:  Kirk Ruths, MD  Advanced Practice Provider:  No care team member to display Electrophysiologist:  None   Referring MD: Cassandria Anger, MD   Chief Complaint  Patient presents with  . Follow-up    Post hospital.     History of Present Illness:    [name] is a 85 y.o. female with a hx of PAF, CAD, hyperlipidemia, COPD with emphysema, GERD and hypertension.  She has known history of PAF seen on her monitor however she refused anticoagulation therapy.  She had a acute lateral MI in June 2021.  Cardiac catheterization at the time revealed occluded first diagonal with 30% mid LAD.  She had balloon angioplasty due to small vessel size.  She was readmitted with hypertensive urgency and acute heart failure in July 2021.  She underwent diuresis, however creatinine jumped, Lasix was subsequently discontinued.  Follow-up echocardiogram in July 2021 showed EF 50 to 55%, mild LAE, mild aortic insufficiency.  She was noted to be in recurrent atrial fibrillation in December 2021. She was also not taking her diuretic at the time. Diuretic was resumed and Cardizem was added.  Patient was last seen by Dr. Stanford Breed on 03/08/2020 at which time she appears to be significantly volume overloaded.  She was felt to be in acute on chronic diastolic heart failure in the setting of uncontrolled heart rate related to atrial fibrillation.  She was finally agreeable to start on Eliquis 2.5 mg twice a day.  Echocardiogram obtained on 03/09/2020 showed EF 35 to 40%, mild LVH, moderately reduced RVEF, RVSP 69.2 mmHg, mild to moderate LAE.  She was started on IV Lasix and IV diltiazem to help with rate control and volume overload.  Transesophageal echocardiogram was planned to cardiovert her on the Eliquis, unfortunately she was found to  have thrombus in the left atrial appendage, therefore DCCV was aborted.  Her discharge weight was 47.6 kg.  Since discharge, she continued to have breathing issues, diuretic was switched to torsemide 40 mg twice daily.  Since discharge, she has been diagnosed with pneumonia near the end of January and was started on doxycycline.  Patient presents today for follow-up.  She says she has dyspnea with minimal exertion.  She is quite frustrated with her current symptom.  I suspect her shortness of breath is due to combination of pulmonary issue and atrial fibrillation.  I discussed her case with Dr. Stanford Breed.  We recommend she start on amiodarone therapy.  We also recommended TEE DCCV.  Unfortunately, the TEE DCCV require a Covid test at upcoming testing facility which she adamantly refused.  She says she relies on her sister to drive her to the facility and it is too far for them.  Unfortunately, [facility] policy does not allow Covid test result from local [facility] or [facility] pharmacy.  I have called the charge nurse in our endoscopy department, we can potentially arrange for her to get the Covid test in the morning of the TEE DCCV as long as she can arrive 3 hours early to get the Covid test in her car.  She is still unwilling to proceed with the study at this point.  She is aware that as long as she remains in atrial fibrillation, her dyspnea on exertion may persist.  I will  obtain a CBC and a basic metabolic panel today to make sure that her renal function has not changed on the higher dose of torsemide.   Past Medical History:  Diagnosis Date  . Anxiety   . Arthritis   . Atrial fibrillation ([place])   . B12 deficiency   . COPD (chronic obstructive pulmonary disease) ([place])   . Depression   . GERD (gastroesophageal reflux disease)   . Hypertension   . Hypoxia with need for home oxygen  03/14/2020  . [place] thrombus of left atrium 03/09/20 03/14/2020  . Osteoarthrosis, hand    both hands  . Peptic ulcer,  unspecified site, unspecified as acute or chronic, without mention of hemorrhage, perforation, or obstruction   . Pneumonia   . PONV (postoperative nausea and vomiting)   . Vitamin D deficiency disease     Past Surgical History:  Procedure Laterality Date  . BLADDER SURGERY     Bladder tack and intestines  . CORONARY/GRAFT ACUTE MI REVASCULARIZATION N/A 08/26/2019   Procedure: Coronary/Graft Acute MI Revascularization;  Surgeon: Sherren Mocha, MD;  Location: [facility];  Service: Cardiovascular;  Laterality: N/A;  . CYSTOCELE REPAIR    . CYSTOSCOPY WITH RETROGRADE PYELOGRAM, URETEROSCOPY AND STENT PLACEMENT Right 09/11/2016   Procedure: CYSTOSCOPY WITH RETROGRADE PYELOGRAM, URETEROSCOPY AND STENT REPLACEMENT;  Surgeon: Cleon Gustin, MD;  Location: [facility];  Service: Urology;  Laterality: Right;  . CYSTOSCOPY WITH STENT PLACEMENT Right 08/11/2016   Procedure: CYSTOSCOPY, URETERSCOPY,  RIGHT RETROGRADE WITH RIGHT STENT PLACEMENT;  Surgeon: Festus Aloe, MD;  Location: [facility];  Service: Urology;  Laterality: Right;  . SPLENECTOMY    . TEE WITHOUT CARDIOVERSION N/A 03/10/2020   Procedure: TRANSESOPHAGEAL ECHOCARDIOGRAM (TEE);  Surgeon: Josue Hector, MD;  Location: [facility];  Service: Cardiovascular;  Laterality: N/A;    Current Medications: Current Meds  Medication Sig  . acetaminophen (TYLENOL) 325 MG tablet Take 2 tablets (650 mg total) by mouth every 4 (four) hours as needed for headache or mild pain. (Patient not taking: Reported on 04/27/2020)  . amiodarone (PACERONE) 200 MG tablet Take 1 tablet (200 mg total) by mouth daily.  Marland Kitchen apixaban (ELIQUIS) 2.5 MG TABS tablet Take 1 tablet (2.5 mg total) by mouth 2 (two) times daily.  . Biotin 1000 MCG tablet Take 1,000 mcg by mouth daily.  . Cholecalciferol 25 MCG (1000 UT) capsule Take 1,000 Units by mouth daily.  . clonazePAM (KLONOPIN) 0.5 MG tablet Take 1 tablet (0.5 mg total) by mouth at bedtime as needed (leg cramps).  TAKE 1 TABLET BY MOUTH AT BEDTIME AS NEEDED FOR LEG CRAMPS  . cyanocobalamin 1000 MCG tablet Take 1,000 mcg by mouth daily.  Marland Kitchen diltiazem (CARDIZEM CD) 120 MG 24 hr capsule Take 1 capsule (120 mg total) by mouth daily.  Marland Kitchen docusate sodium (COLACE) 100 MG capsule Take 1 capsule (100 mg total) by mouth daily as needed for mild constipation.  . hydrocerin (EUCERIN) CREA Apply 1 application topically 2 (two) times daily.  Marland Kitchen HYDROcodone-homatropine (HYCODAN) 5-1.5 MG/5ML syrup Take 5 mLs by mouth every 8 (eight) hours as needed for cough.  . isosorbide mononitrate (IMDUR) 30 MG 24 hr tablet Take 1 tablet (30 mg total) by mouth daily.  Marland Kitchen ketoconazole (NIZORAL) 2 % cream APPLY TOPICALLY TO THE AFFECTED AREA DAILY (Patient taking differently: Apply 1 application topically daily as needed for irritation.)  . levalbuterol (XOPENEX HFA) 45 MCG/ACT inhaler Inhale 2 puffs into the lungs every 4 (four) hours as needed  for wheezing or shortness of breath.  . levothyroxine (SYNTHROID) 25 MCG tablet Take 25 mcg by mouth every morning.  . metoprolol tartrate (LOPRESSOR) 100 MG tablet Take 1 tablet (100 mg total) by mouth 2 (two) times daily.  . nitroGLYCERIN (NITROSTAT) 0.4 MG SL tablet Place 1 tablet (0.4 mg total) under the tongue every 5 (five) minutes as needed for chest pain.  Marland Kitchen nystatin (MYCOSTATIN) 100000 UNIT/ML suspension Take 5 mLs (500,000 Units total) by mouth 4 (four) times daily.  . pantoprazole (PROTONIX) 40 MG tablet Take 1 tablet (40 mg total) by mouth daily.  . potassium chloride (KLOR-CON) 10 MEQ tablet Take 1 tablet (10 mEq total) by mouth daily.  Marland Kitchen Propylene Glycol (SYSTANE BALANCE) 0.6 % SOLN Place 1 drop into both eyes 2 (two) times daily as needed (dry eyes).  . rosuvastatin (CRESTOR) 10 MG tablet Take 1 tablet (10 mg total) by mouth daily.  Marland Kitchen torsemide (DEMADEX) 20 MG tablet Take 2 tablets (40 mg total) by mouth 2 (two) times daily.     Allergies:   Amoxicillin, Codeine, Levaquin  [levofloxacin in d5w], and Other   Social History   Socioeconomic History  . Marital status: Divorced    Spouse name: Not on file  . Number of children: 2  . Years of education: Not on file  . Highest education level: Not on file  Occupational History  . Occupation: Airline pilot: [employer]  Tobacco Use  . Smoking status: Former Smoker    Packs/day: 1.00    Types: Cigarettes    Quit date: 06/06/08    Years since quitting: 12.1  . Smokeless tobacco: Never Used  . Tobacco comment: Pt unsure how many years she has been smoking   Vaping Use  . Vaping Use: Never used  Substance and Sexual Activity  . Alcohol use: No  . Drug use: No  . Sexual activity: Not Currently  Other Topics Concern  . Not on file  Social History Narrative   Retired, works part time   Divorced   Former Smoker   1- Son alcoholic abusive   Daughter died in 2006/06/07   Social Determinants of Health   Financial Resource Strain: High Risk  . Difficulty of Paying Living Expenses: Very hard  Food Insecurity: Food Insecurity Present  . Worried About Charity fundraiser in the Last Year: Sometimes true  . Ran Out of Food in the Last Year: Sometimes true  Transportation Needs: No Transportation Needs  . Lack of Transportation (Medical): No  . Lack of Transportation (Non-Medical): No  Physical Activity: Unknown  . Days of Exercise per Week: Patient refused  . Minutes of Exercise per Session: Not on file  Stress: No Stress Concern Present  . Feeling of Stress : Not at all  Social Connections: Not on file     Family History: The patient's family history includes Arthritis in an other family member; Coronary artery disease in an other family member; Diabetes in her daughter; Heart disease in her father; Hypertension in an other family member.  ROS:   Please see the history of present illness.     All other systems reviewed and are negative.  EKGs/Labs/Other Studies Reviewed:    The following studies were  reviewed today:  Echo 03/09/2020 1. Left ventricular ejection fraction, by estimation, is 35 to 40%. The  left ventricle has moderately decreased function. The left ventricle  demonstrates global hypokinesis. There is mild concentric left ventricular  hypertrophy. Left ventricular  diastolic function could not be evaluated.  2. Right ventricular systolic function is moderately reduced. The right  ventricular size is mildly enlarged. There is severely elevated pulmonary  artery systolic pressure. The estimated right ventricular systolic  pressure is 123456 mmHg.  3. Left atrial size was mild to moderately dilated.  4. Right atrial size was mildly dilated.  5. The mitral valve is grossly normal. Mild to moderate mitral valve  regurgitation. No evidence of mitral stenosis.  6. The aortic valve is tricuspid. There is mild calcification of the  aortic valve. There is mild thickening of the aortic valve. Aortic valve  regurgitation is mild. Mild aortic valve sclerosis is present, with no  evidence of aortic valve stenosis.  7. The inferior vena cava is dilated in size with <50% respiratory  variability, suggesting right atrial pressure of 15 mmHg.   EKG:  EKG is ordered today.  The ekg ordered today demonstrates Atrial fibrillation  Recent Labs: 03/08/2020: ALT 36; Magnesium 2.2; TSH 7.708 03/13/2020: B Natriuretic Peptide 3,757.7 04/27/2020: BUN 30; Creatinine, Ser 1.42; Hemoglobin 13.2; Platelets 408; Potassium 3.8; Sodium 140  Recent Lipid Panel    Component Value Date/Time   CHOL 239 (H) 08/26/2019 1240   TRIG 155 (H) 08/26/2019 1240   HDL 54 08/26/2019 1240   CHOLHDL 4.4 08/26/2019 1240   VLDL 31 08/26/2019 1240   LDLCALC 154 (H) 08/26/2019 1240   LDLDIRECT 144.3 01/31/2011 0905     Risk Assessment/Calculations:    CHA2DS2-VASc Score = 6  This indicates a 9.7% annual risk of stroke. The patient's score is based upon: CHF History: Yes HTN History: Yes Diabetes History:  No Stroke History: No Vascular Disease History: Yes Age Score: 2 Gender Score: 1      Physical Exam:    VS:  BP (!) 142/82   Pulse 77   Ht '5\' 4"'$  (1.626 m)   Wt 103 lb (46.7 kg)   SpO2 94%   BMI 17.68 kg/m     Wt Readings from Last 3 Encounters:  04/27/20 103 lb (46.7 kg)  04/16/20 106 lb (48.1 kg)  03/14/20 104 lb 14.4 oz (47.6 kg)     GEN:  Well nourished, well developed in no acute distress HEENT: Normal NECK: No JVD; No carotid bruits LYMPHATICS: No lymphadenopathy CARDIAC: Irregularly irregular, no murmurs, rubs, gallops RESPIRATORY: Diminished breath sound, no rales, wheezing or rhonchi. ABDOMEN: Soft, non-tender, non-distended MUSCULOSKELETAL:  No edema; No deformity  SKIN: Warm and dry NEUROLOGIC:  Alert and oriented x 3 PSYCHIATRIC:  Normal affect   ASSESSMENT:    1. Persistent atrial fibrillation ([place])   2. Medication management   3. Coronary artery disease involving native coronary artery of native heart without angina pectoris   4. Hyperlipidemia with target LDL less than 70   5. COPD with chronic bronchitis ([place])   6. Essential hypertension    PLAN:    In order of problems listed above:  1. Persistent atrial fibrillation: Patient continues to have significant dyspnea on minimal exertion.  I suspect this is related to combination of her pulmonary issue and atrial fibrillation.  We recommend start on amiodarone 200 mg daily.  We recommended outpatient TEE DCCV by Dr. Stanford Breed, however the patient is unwilling to drive to the [id number] facility designated by [facility]: Patient changed her mind and is willing to proceed with TEE DCCV and obtain the rapid Covid test 3 hours prior to the procedure.  I spoke with the endoscopy department, on  the day of the procedure, she will arrive 3 hours early and called the endoscopy department.  They will send a nurse to her car to obtain the rapid Covid test.  She is to stay in her car until the result of  the Covid test come back so she can proceed with TEE DCCV.  -She has been compliant with Eliquis however given the prior history of left atrial appendage clot, will proceed with TEE prior to DCCV.  -We likely will discontinue her diltiazem after the cardioversion and continue on metoprolol by itself.  2. CAD: Denies any chest pain.  3. Combined systolic and diastolic heart failure: Recent drop in the ejection fraction likely related to atrial fibrillation.  Prior to recent hospitalization, she was on 100 mg daily of torsemide. Interestingly, she was discharged on 80 mg daily of Lasix. Since discharge, Lasix has been switched back to torsemide 40 mg twice daily dosing.  She will need a basic metabolic panel today.  4. Hyperlipidemia: On Crestor  5. Hypertension: Continue on current therapy  6. COPD: Followed by pulmonology service   Shared Decision Making/Informed Consent The risks [stroke, cardiac arrhythmias rarely resulting in the need for a temporary or permanent pacemaker, skin irritation or burns, esophageal damage, perforation (1:10,000 risk), bleeding, pharyngeal hematoma as well as other potential complications associated with conscious sedation including aspiration, arrhythmia, respiratory failure and death], benefits (treatment guidance, restoration of normal sinus rhythm, diagnostic support) and alternatives of a transesophageal echocardiogram guided cardioversion were discussed in detail with [name] and she is willing to proceed.       Medication Adjustments/Labs and Tests Ordered: Current medicines are reviewed at length with the patient today.  Concerns regarding medicines are outlined above.  Orders Placed This Encounter  Procedures  . Basic metabolic panel  . CBC  . EKG 12-Lead   Meds ordered this encounter  Medications  . amiodarone (PACERONE) 200 MG tablet    Sig: Take 1 tablet (200 mg total) by mouth daily.    Dispense:  90 tablet    Refill:  3    Patient  Instructions  Medication Instructions:   START Amiodarone 200 mg 2 times a day  *If you need a refill on your cardiac medications before your next appointment, please call your pharmacy*  Lab Work: Your physician recommends that you return for lab work TODAY:   BMET  CBC  You will need a COVID-19  test prior to your procedure. This is a Drive Up Visit at [id number] [place], [place] [address]. Someone will direct you to the appropriate testing line. Stay in your car and someone will be with you shortly.  If you have labs (blood work) drawn today and your tests are completely normal, you will receive your results only by: Marland Kitchen MyChart Message (if you have MyChart) OR . A paper copy in the mail If you have any lab test that is abnormal or we need to change your treatment, we will call you to review the results.  Testing/Procedures: Your physician has recommended that you have a Cardioversion (DCCV). Electrical Cardioversion uses a jolt of electricity to your heart either through paddles or wired patches attached to your chest. This is a controlled, usually prescheduled, procedure. Defibrillation is done under light anesthesia in the hospital, and you usually go home the day of the procedure. This is done to get your heart back into a normal rhythm. You are not awake for the procedure. Please see the instruction  sheet given to you today.  Scheduled for 05/11/20 at 7:30 AM   Follow-Up: At [facility], you and your health needs are our priority.  As part of our continuing mission to provide you with exceptional heart care, we have created designated Provider Care Teams.  These Care Teams include your primary Cardiologist (physician) and Advanced Practice Providers (APPs -  Physician Assistants and Nurse Practitioners) who all work together to provide you with the care you need, when you need it.  We recommend signing up for the patient portal called "MyChart".  Sign up information is  provided on this After Visit Summary.  MyChart is used to connect with patients for Virtual Visits (Telemedicine).  Patients are able to view lab/test results, encounter notes, upcoming appointments, etc.  Non-urgent messages can be sent to your provider as well.   To learn more about what you can do with MyChart, go to [email or web address].    Your next appointment:   3-4 weeks   The format for your next appointment:   In Person  Provider:   Kirk Ruths, MD  Other Instructions     Dear [name] You are scheduled for a TEE/Cardioversion/TEE Cardioversion on Tuesday 05/11/20 with Dr. Kirk Ruths.  Please arrive at the [facility] (Main Entrance A) at [facility]: [address] [place], [place] [address] at 6:30 AM.   DIET: Nothing to eat or drink after midnight except a sip of water with medications (see medication instructions below)  Medication Instructions: Hold Potassium the morning of procedure Hold Torsemide the morning of procedure   Continue your anticoagulant: Eliquis You will need to continue your anticoagulant after your procedure until you  are told by your  Provider that it is safe to stop   Labs: If patient is on Coumadin, patient needs pt/INR, CBC, BMET within 3 days (No pt/INR needed for patients taking Xarelto, Eliquis, Pradaxa) For patients receiving anesthesia for TEE and all Cardioversion patients: BMET, CBC within 1 week  Come to:  (Lab option #1) Come to the lab at [facility] between the hours of 8:00 am and 4:30 pm. You do not have to be fasting. (Lab option #2) Lab at an alternate location (Lab option #3) your lab work will be done at the hospital prior to your procedure - you will need to arrive 1  hours ahead of your procedure  You must have a responsible person to drive you home and stay in the waiting area during your procedure. Failure to do so could result in cancellation.  Bring your insurance  cards.  *Special Note: Every effort is made to have your procedure done on time. Occasionally there are emergencies that occur at the hospital that may cause delays. Please be patient if a delay does occur.      Hilbert Corrigan, [place]  04/28/2020 10:58 PM    [facility]

## 2020-04-27 NOTE — Telephone Encounter (Signed)
Pt c/o medication issue:  1. Name of Medication: amiodarone (PACERONE) 200 MG tablet  2. How are you currently taking this medication (dosage and times per day)? n/a  3. Are you having a reaction (difficulty breathing--STAT)? no  4. What is your medication issue? Patient states that she is not taking this medication. She said that this medication needs to be taking in the hospital with supervision and she is not going to die alone at home. Please advise.

## 2020-04-27 NOTE — Telephone Encounter (Signed)
Returned call to patient she stated she would like to speak to [employer] PA.She saw him today and he prescribed Amiodarone.She has read side effects and she is afraid to start taking.Advised I will send message to [facility] PA.

## 2020-04-27 NOTE — Telephone Encounter (Signed)
Spoke with pt who states "I don't have very much more time on this earth I feel..." Pt states she is going to heart doctor today and wants to know if her emphysema is getting worse or if it something else that is making her feel bad. Pt is scheduled with NP Geraldo Pitter via telephone at 3pm to discuss pt concerns. Pt states understanding. Routing to [employer]  as Juluis Rainier. Nothing further needed at this time.

## 2020-04-27 NOTE — Telephone Encounter (Signed)
Patient contacted today for televisit. She expressed her wish to cancel appointment stating "I want to cancel, do not see the point" and then hung up. Will route with Dr. Loanne Drilling. Keep regular follow-up. She saw cardiology earlier today.

## 2020-04-27 NOTE — Patient Instructions (Addendum)
Medication Instructions:   START Amiodarone 200 mg 2 times a day  *If you need a refill on your cardiac medications before your next appointment, please call your pharmacy*  Lab Work: Your physician recommends that you return for lab work TODAY:   BMET  CBC  You will need a COVID-19  test prior to your procedure. This is a Drive Up Visit at [id number] [place], [place] [address]. Someone will direct you to the appropriate testing line. Stay in your car and someone will be with you shortly.  If you have labs (blood work) drawn today and your tests are completely normal, you will receive your results only by: Marland Kitchen MyChart Message (if you have MyChart) OR . A paper copy in the mail If you have any lab test that is abnormal or we need to change your treatment, we will call you to review the results.  Testing/Procedures: Your physician has recommended that you have a Cardioversion (DCCV). Electrical Cardioversion uses a jolt of electricity to your heart either through paddles or wired patches attached to your chest. This is a controlled, usually prescheduled, procedure. Defibrillation is done under light anesthesia in the hospital, and you usually go home the day of the procedure. This is done to get your heart back into a normal rhythm. You are not awake for the procedure. Please see the instruction sheet given to you today.  Scheduled for 05/11/20 at 7:30 AM   Follow-Up: At [facility], you and your health needs are our priority.  As part of our continuing mission to provide you with exceptional heart care, we have created designated Provider Care Teams.  These Care Teams include your primary Cardiologist (physician) and Advanced Practice Providers (APPs -  Physician Assistants and Nurse Practitioners) who all work together to provide you with the care you need, when you need it.  We recommend signing up for the patient portal called "MyChart".  Sign up information is provided on this  After Visit Summary.  MyChart is used to connect with patients for Virtual Visits (Telemedicine).  Patients are able to view lab/test results, encounter notes, upcoming appointments, etc.  Non-urgent messages can be sent to your provider as well.   To learn more about what you can do with MyChart, go to [email or web address].    Your next appointment:   3-4 weeks   The format for your next appointment:   In Person  Provider:   Kirk Ruths, MD  Other Instructions     Dear [name] You are scheduled for a TEE/Cardioversion/TEE Cardioversion on Tuesday 05/11/20 with Dr. Kirk Ruths.  Please arrive at the [facility] (Main Entrance A) at [facility]: [address] [place], [place] [address] at 6:30 AM.   DIET: Nothing to eat or drink after midnight except a sip of water with medications (see medication instructions below)  Medication Instructions: Hold Potassium the morning of procedure Hold Torsemide the morning of procedure   Continue your anticoagulant: Eliquis You will need to continue your anticoagulant after your procedure until you  are told by your  Provider that it is safe to stop   Labs: If patient is on Coumadin, patient needs pt/INR, CBC, BMET within 3 days (No pt/INR needed for patients taking Xarelto, Eliquis, Pradaxa) For patients receiving anesthesia for TEE and all Cardioversion patients: BMET, CBC within 1 week  Come to:  (Lab option #1) Come to the lab at [place] [address] between the hours of  8:00 am and 4:30 pm. You do not have to be fasting. (Lab option #2) Lab at an alternate location (Lab option #3) your lab work will be done at the hospital prior to your procedure - you will need to arrive 1  hours ahead of your procedure  You must have a responsible person to drive you home and stay in the waiting area during your procedure. Failure to do so could result in cancellation.  Bring your insurance cards.  *Special Note:  Every effort is made to have your procedure done on time. Occasionally there are emergencies that occur at the hospital that may cause delays. Please be patient if a delay does occur.

## 2020-04-28 ENCOUNTER — Other Ambulatory Visit: Payer: Self-pay | Admitting: Physician Assistant

## 2020-04-28 ENCOUNTER — Encounter: Payer: Self-pay | Admitting: Physician Assistant

## 2020-04-28 ENCOUNTER — Telehealth: Payer: Self-pay | Admitting: Physician Assistant

## 2020-04-28 LAB — CBC
Hematocrit: 42.6 % (ref 34.0–46.6)
Hemoglobin: 13.2 g/dL (ref 11.1–15.9)
MCH: 25.7 pg — ABNORMAL LOW (ref 26.6–33.0)
MCHC: 31 g/dL — ABNORMAL LOW (ref 31.5–35.7)
MCV: 83 fL (ref 79–97)
Platelets: 408 10*3/uL (ref 150–450)
RBC: 5.14 x10E6/uL (ref 3.77–5.28)
RDW: 17.1 % — ABNORMAL HIGH (ref 11.7–15.4)
WBC: 11.3 10*3/uL — ABNORMAL HIGH (ref 3.4–10.8)

## 2020-04-28 LAB — BASIC METABOLIC PANEL
BUN/Creatinine Ratio: 21 (ref 12–28)
BUN: 30 mg/dL — ABNORMAL HIGH (ref 8–27)
CO2: 29 mmol/L (ref 20–29)
Calcium: 9.4 mg/dL (ref 8.7–10.3)
Chloride: 91 mmol/L — ABNORMAL LOW (ref 96–106)
Creatinine, Ser: 1.42 mg/dL — ABNORMAL HIGH (ref 0.57–1.00)
GFR calc Af Amer: 40 mL/min/{1.73_m2} — ABNORMAL LOW (ref >59)
GFR calc non Af Amer: 34 mL/min/{1.73_m2} — ABNORMAL LOW (ref >59)
Glucose: 66 mg/dL (ref 65–99)
Potassium: 3.8 mmol/L (ref 3.5–5.2)
Sodium: 140 mmol/L (ref 134–144)

## 2020-04-28 NOTE — Telephone Encounter (Signed)
Spoke with patient and rescheduled.  She did want a later cardioversion time  She will arrive at 9:00 am for 10:00 am appointment on 05/11/2020  Wanted to go to Covid testing site for screening not arrive early morning

## 2020-04-28 NOTE — Telephone Encounter (Signed)
[name] is calling stating she wishes to reschedule all the appointments she canceled yesterday due to not having transportation. She states she now has a ride and wishes to reschedule for all the same dates and times.

## 2020-04-29 ENCOUNTER — Encounter: Payer: Self-pay | Admitting: *Deleted

## 2020-04-30 ENCOUNTER — Telehealth: Payer: Self-pay | Admitting: Cardiology

## 2020-04-30 NOTE — Telephone Encounter (Signed)
Unclear if her SOB is truly from amiodarone, she has been short of breath for awhile form both pulmonary and afib issues. If she cannot tolerate amiodarone, that is fine. My concern is her TEE DCCV that's coming up will not hold without antiarrhythmic therapy. Will check with Dr. Stanford Breed for his recommendation.   Also I see she is scheduled for COVID test at [facility] location on 3/4, which she had adamantly refused during our conversation citing the distant location for her sister to drive her there and we arranged for her to arrive in the hospital 3 hours early for her TEE DCCV on 3/8 to obtain in-house COVID test, I don't if she changed her mind again about that and decided to get the COVID test at the [facility] testing facility.

## 2020-04-30 NOTE — Telephone Encounter (Signed)
     Pt c/o medication issue:  1. Name of Medication:   amiodarone (PACERONE) 200 MG tablet    2. How are you currently taking this medication (dosage and times per day)?  3. Are you having a reaction (difficulty breathing--STAT)?   4. What is your medication issue? Pt said she's having side effect with this medication, she said she's having hard time to breath especially when she lays down

## 2020-04-30 NOTE — Telephone Encounter (Signed)
If she is only willing to take 100 mg twice daily of amiodarone would continue. Kirk Ruths

## 2020-04-30 NOTE — Telephone Encounter (Signed)
Spoke to patient she stated she is taking Amiodarone 200 mg 1/2 tablet twice a day.Stated she is not going to take any more.She cannot get a good breath.Stated sob is worse.I will send message to [facility].

## 2020-05-01 DIAGNOSIS — J438 Other emphysema: Secondary | ICD-10-CM | POA: Diagnosis not present

## 2020-05-01 DIAGNOSIS — J449 Chronic obstructive pulmonary disease, unspecified: Secondary | ICD-10-CM | POA: Diagnosis not present

## 2020-05-03 ENCOUNTER — Telehealth: Payer: Self-pay | Admitting: Cardiology

## 2020-05-03 NOTE — Telephone Encounter (Signed)
Would take amiodarone 200 mg daily as directed to help maintain sinus folllowing DCCV; plan if TEE/DCCV 3/8 Kirk Ruths

## 2020-05-03 NOTE — Telephone Encounter (Signed)
Pt c/o Shortness Of Breath: STAT if SOB developed within the last 24 hours or pt is noticeably SOB on the phone  1. Are you currently SOB (can you hear that pt is SOB on the phone)? Yes   2. How long have you been experiencing SOB? About 4 days  3. Are you SOB when sitting or when up moving around? Both patient can sleep  4. Are you currently experiencing any other symptoms? Tired and states the new medication makes her fell very weird

## 2020-05-03 NOTE — Telephone Encounter (Signed)
Spoke with pt, Aware of dr crenshaw's recommendations.  °

## 2020-05-03 NOTE — Telephone Encounter (Signed)
Spoke with patient who states she is still experiencing shortness of breath. Patient states she has weighed 106lbs for the last several days and was previously 103. Patient denies a weight gain greater than 3 pounds overnight or 5 pounds in a week. Patient states she feels like she has phlegm in her throat and is having a hard time breathing. Patient states her legs are still swollen but states its not more than normal. Patient states that currently her blood pressure is 159/92 with pulse of 73. Patient denies any chest pain, dizziness or any other symptoms. Patient states her torsemide dose was recently decreased and she states that the '80mg'$  daily is not working for her.   Patient states that she has not tried her inhaler but states she is going to try that now and see if it helps. Patient wanted to see if Dr. Stanford Breed had any recommendations.  Patient also wanted to make Dr. Stanford Breed aware that she is not taking her amiodarone and feels as if it made her breathing worse and states that she only took 2 doses. Patient aware of ED precautions should symyptoms worsen. Advised patient I would forward message to Dr. Stanford Breed to make him aware.

## 2020-05-04 ENCOUNTER — Ambulatory Visit: Payer: Self-pay | Admitting: *Deleted

## 2020-05-04 ENCOUNTER — Ambulatory Visit: Payer: Medicare Other | Admitting: Physician Assistant

## 2020-05-04 NOTE — Telephone Encounter (Signed)
Patient has restarted amiodarone. She continue to have dyspnea and leg swelling. I decided to increase her torsemide to '60mg'$  AM and '40mg'$  PM. Note, patient was on '100mg'$  daily of torsemide prior to her admission last year.   She is scheduled to obtain COVID test at [facility] location this Friday before her TEE DCCV next Tuesday. (previously she refused testing for COVID at [facility] and only agreed to COVID test in the morning of TEE DCCV, apparently she has changed her mind)

## 2020-05-05 ENCOUNTER — Telehealth: Payer: Self-pay | Admitting: Cardiology

## 2020-05-05 ENCOUNTER — Telehealth: Payer: Self-pay | Admitting: *Deleted

## 2020-05-05 NOTE — Telephone Encounter (Signed)
Returned the call to the patient. She states that she did not call and nothing was needed at this time.

## 2020-05-05 NOTE — Telephone Encounter (Signed)
,   Chronic Care Management   Outreach Note  05/05/2020 Name: [name] MRN: [id number] DOB: Oct 28, 1935  Referred by: Cassandria Anger, MD Reason for referral : Care Coordination (CHF; COPD; HTN- Unsuccessful outreach attempt)  An unsuccessful telephone outreach was attempted today. The patient was referred to the case management team for assistance with care management and care coordination.  She was previously active with the [place] team.  Follow Up Plan:   A HIPAA compliant phone message was left for the patient providing contact information and requesting a return call.   Telephone follow up appointment with care management team member scheduled for: Wednesday May 12, 2020 at 10:00 am  Oneta Rack, RN, BSN, [place] [phone]

## 2020-05-05 NOTE — Telephone Encounter (Signed)
Patient is requesting to clarify which heart medications she has to take. Please return call to discuss.

## 2020-05-06 ENCOUNTER — Telehealth: Payer: Self-pay | Admitting: *Deleted

## 2020-05-06 NOTE — Telephone Encounter (Signed)
  Care Management   Outreach Note  05/06/2020 Name: [name] MRN: [id number] DOB: [birth year]  Referred by: Cassandria Anger, MD Reason for referral : Care Coordination (RN CM Telephone Outreach, unsuccessful second attempt)  A second unsuccessful telephone outreach was attempted today. The patient returned my call after previous attempt yesterday and left me a voice message requesting call back; she was referred to the case management team for assistance with care management and care coordination.   Follow Up Plan:   A HIPAA compliant phone message was left for the patient providing contact information and requesting a return call.   I will re-attempt outreach to patient again next week  Oneta Rack, RN, BSN, [place] [phone]

## 2020-05-07 ENCOUNTER — Other Ambulatory Visit (HOSPITAL_COMMUNITY): Payer: Medicare Other

## 2020-05-07 ENCOUNTER — Other Ambulatory Visit (HOSPITAL_COMMUNITY)
Admission: RE | Admit: 2020-05-07 | Discharge: 2020-05-07 | Disposition: A | Discharge status: Home / Self Care | Payer: Medicare Other | Source: Ambulatory Visit | Attending: Cardiology | Admitting: Cardiology

## 2020-05-07 DIAGNOSIS — Z01812 Encounter for preprocedural laboratory examination: Secondary | ICD-10-CM | POA: Insufficient documentation

## 2020-05-07 DIAGNOSIS — Z20822 Contact with and (suspected) exposure to covid-19: Secondary | ICD-10-CM | POA: Diagnosis not present

## 2020-05-07 LAB — SARS CORONAVIRUS 2 (TAT 6-24 HRS): SARS Coronavirus 2: NEGATIVE

## 2020-05-10 ENCOUNTER — Telehealth: Payer: Medicare Other

## 2020-05-11 ENCOUNTER — Ambulatory Visit (HOSPITAL_COMMUNITY): Payer: Medicare Other | Admitting: Anesthesiology

## 2020-05-11 ENCOUNTER — Ambulatory Visit (HOSPITAL_COMMUNITY)
Admission: RE | Admit: 2020-05-11 | Discharge: 2020-05-11 | Disposition: A | Discharge status: Home / Self Care | Payer: Medicare Other | Attending: Cardiology | Admitting: Cardiology

## 2020-05-11 ENCOUNTER — Ambulatory Visit (HOSPITAL_COMMUNITY): Admit: 2020-05-11 | Payer: Medicare Other | Admitting: Cardiology

## 2020-05-11 ENCOUNTER — Encounter (HOSPITAL_COMMUNITY): Payer: Self-pay | Admitting: Cardiology

## 2020-05-11 ENCOUNTER — Other Ambulatory Visit: Payer: Self-pay

## 2020-05-11 ENCOUNTER — Encounter (HOSPITAL_COMMUNITY): Payer: Self-pay

## 2020-05-11 ENCOUNTER — Ambulatory Visit (HOSPITAL_BASED_OUTPATIENT_CLINIC_OR_DEPARTMENT_OTHER)
Admission: RE | Admit: 2020-05-11 | Discharge: 2020-05-11 | Disposition: A | Discharge status: Home / Self Care | Payer: Medicare Other | Source: Ambulatory Visit | Attending: Physician Assistant | Admitting: Physician Assistant

## 2020-05-11 ENCOUNTER — Encounter (HOSPITAL_COMMUNITY)
Admission: RE | Disposition: A | Discharge status: Home / Self Care | Payer: Self-pay | Source: Home / Self Care | Attending: Cardiology

## 2020-05-11 DIAGNOSIS — J449 Chronic obstructive pulmonary disease, unspecified: Secondary | ICD-10-CM | POA: Diagnosis not present

## 2020-05-11 DIAGNOSIS — Z881 Allergy status to other antibiotic agents status: Secondary | ICD-10-CM | POA: Diagnosis not present

## 2020-05-11 DIAGNOSIS — I340 Nonrheumatic mitral (valve) insufficiency: Secondary | ICD-10-CM | POA: Diagnosis not present

## 2020-05-11 DIAGNOSIS — Z880 Allergy status to penicillin: Secondary | ICD-10-CM | POA: Insufficient documentation

## 2020-05-11 DIAGNOSIS — E559 Vitamin D deficiency, unspecified: Secondary | ICD-10-CM | POA: Diagnosis not present

## 2020-05-11 DIAGNOSIS — Z8249 Family history of ischemic heart disease and other diseases of the circulatory system: Secondary | ICD-10-CM | POA: Insufficient documentation

## 2020-05-11 DIAGNOSIS — I2510 Atherosclerotic heart disease of native coronary artery without angina pectoris: Secondary | ICD-10-CM | POA: Diagnosis not present

## 2020-05-11 DIAGNOSIS — I110 Hypertensive heart disease with heart failure: Secondary | ICD-10-CM | POA: Diagnosis not present

## 2020-05-11 DIAGNOSIS — E785 Hyperlipidemia, unspecified: Secondary | ICD-10-CM | POA: Diagnosis not present

## 2020-05-11 DIAGNOSIS — Z79890 Hormone replacement therapy (postmenopausal): Secondary | ICD-10-CM | POA: Insufficient documentation

## 2020-05-11 DIAGNOSIS — I4891 Unspecified atrial fibrillation: Secondary | ICD-10-CM

## 2020-05-11 DIAGNOSIS — Z87891 Personal history of nicotine dependence: Secondary | ICD-10-CM | POA: Insufficient documentation

## 2020-05-11 DIAGNOSIS — I083 Combined rheumatic disorders of mitral, aortic and tricuspid valves: Secondary | ICD-10-CM | POA: Insufficient documentation

## 2020-05-11 DIAGNOSIS — I4819 Other persistent atrial fibrillation: Secondary | ICD-10-CM | POA: Diagnosis not present

## 2020-05-11 DIAGNOSIS — Z7901 Long term (current) use of anticoagulants: Secondary | ICD-10-CM | POA: Diagnosis not present

## 2020-05-11 DIAGNOSIS — Z79899 Other long term (current) drug therapy: Secondary | ICD-10-CM | POA: Insufficient documentation

## 2020-05-11 DIAGNOSIS — E876 Hypokalemia: Secondary | ICD-10-CM | POA: Diagnosis not present

## 2020-05-11 DIAGNOSIS — I351 Nonrheumatic aortic (valve) insufficiency: Secondary | ICD-10-CM

## 2020-05-11 DIAGNOSIS — Z9081 Acquired absence of spleen: Secondary | ICD-10-CM | POA: Insufficient documentation

## 2020-05-11 DIAGNOSIS — Z885 Allergy status to narcotic agent status: Secondary | ICD-10-CM | POA: Diagnosis not present

## 2020-05-11 DIAGNOSIS — I5042 Chronic combined systolic (congestive) and diastolic (congestive) heart failure: Secondary | ICD-10-CM | POA: Diagnosis not present

## 2020-05-11 HISTORY — PX: CARDIOVERSION: SHX1299

## 2020-05-11 HISTORY — PX: TEE WITHOUT CARDIOVERSION: SHX5443

## 2020-05-11 LAB — POCT I-STAT, CHEM 8
BUN: 49 mg/dL — ABNORMAL HIGH (ref 8–23)
Calcium, Ion: 0.87 mmol/L — CL (ref 1.15–1.40)
Chloride: 90 mmol/L — ABNORMAL LOW (ref 98–111)
Creatinine, Ser: 1.7 mg/dL — ABNORMAL HIGH (ref 0.44–1.00)
Glucose, Bld: 90 mg/dL (ref 70–99)
HCT: 46 % (ref 36.0–46.0)
Hemoglobin: 15.6 g/dL — ABNORMAL HIGH (ref 12.0–15.0)
Potassium: 4.9 mmol/L (ref 3.5–5.1)
Sodium: 133 mmol/L — ABNORMAL LOW (ref 135–145)
TCO2: 39 mmol/L — ABNORMAL HIGH (ref 22–32)

## 2020-05-11 SURGERY — ECHOCARDIOGRAM, TRANSESOPHAGEAL
Anesthesia: Monitor Anesthesia Care

## 2020-05-11 MED ORDER — PROPOFOL 500 MG/50ML IV EMUL
INTRAVENOUS | Status: DC | PRN
  Administered 2020-05-11: 50 ug/kg/min via INTRAVENOUS

## 2020-05-11 MED ORDER — PROPOFOL 10 MG/ML IV BOLUS
INTRAVENOUS | Status: DC | PRN
  Administered 2020-05-11: 20 mg via INTRAVENOUS
  Administered 2020-05-11: 30 mg via INTRAVENOUS

## 2020-05-11 MED ORDER — SODIUM CHLORIDE 0.9 % IV SOLN: INTRAVENOUS | Status: DC | PRN

## 2020-05-11 MED ORDER — SODIUM CHLORIDE 0.9 % IV SOLN: INTRAVENOUS | Status: DC

## 2020-05-11 MED ORDER — PROPOFOL 10 MG/ML IV BOLUS: INTRAVENOUS | Status: DC | PRN

## 2020-05-11 NOTE — Anesthesia Procedure Notes (Signed)
Procedure Name: MAC Date/Time: 05/11/2020 10:05 AM Performed by: Kathryne Hitch, CRNA Pre-anesthesia Checklist: Patient identified, Emergency Drugs available, Suction available and Patient being monitored Patient Re-evaluated:Patient Re-evaluated prior to induction Oxygen Delivery Method: Simple face mask Preoxygenation: Pre-oxygenation with 100% oxygen Induction Type: IV induction Placement Confirmation: positive ETCO2 Dental Injury: Teeth and Oropharynx as per pre-operative assessment

## 2020-05-11 NOTE — Transfer of Care (Signed)
Immediate Anesthesia Transfer of Care Note  Patient: [name]  Procedure(s) Performed: TRANSESOPHAGEAL ECHOCARDIOGRAM (TEE) (N/A ) CARDIOVERSION (N/A )  Patient Location: Endoscopy Unit  Anesthesia Type:MAC  Level of Consciousness: drowsy and patient cooperative  Airway & Oxygen Therapy: Patient Spontanous Breathing and Patient connected to nasal cannula oxygen  Post-op Assessment: Report given to RN and Post -op Vital signs reviewed and stable  Post vital signs: Reviewed and stable  Last Vitals:  Vitals Value Taken Time  BP 154/35   Temp    Pulse 65   Resp    SpO2 96     Last Pain:  Vitals:   05/11/20 0921  PainSc: 0-No pain         Complications: No complications documented.

## 2020-05-11 NOTE — H&P (Signed)
Office Visit  04/27/2020 [facility] [place], [place], [place]  Cardiology  Persistent atrial fibrillation ([name]) +5 more  Dx  Follow-up ; Referred by Plotnikov, Evie Lacks, MD  Reason for Visit    Additional Documentation  Vitals:  BP 142/82Important   Pulse 77  Ht '5\' 4"'$  (1.626 m)  Wt 46.7 kg  SpO2 94%  BMI 17.68 kg/m  BSA 1.45 m    More Vitals  Flowsheets:  Anthropometrics,  NEWS,  MEWS Score,  CHADS2-VASc Score    Encounter Info:  Billing Info,  History,  Allergies,  Detailed Report     All Notes   Progress Notes by Almyra Deforest, PA at 04/27/2020 11:15 AM  Author: Almyra Deforest, PA Author Type: Physician Assistant Filed: 04/28/2020 11:05 PM  Note Status: Signed Cosign: Cosign Not Required Encounter Date: 04/27/2020  Editor: Almyra Deforest, [place] (Physician Assistant)                Cardiology Office Note:    Date:  04/28/2020   ID:  [name], DOB [birth year], MRN [id number]  PCP:  Cassandria Anger, MD              [place]  Cardiologist:  Kirk Ruths, MD  Advanced Practice Provider:  No care team member to display Electrophysiologist:  None   Referring MD: Cassandria Anger, MD   Chief Complaint  Patient presents with  . Follow-up    Post hospital.     History of Present Illness:    [name] is a 85 y.o. female with a hx of PAF, CAD, hyperlipidemia, COPD with emphysema, GERD and hypertension.  She has known history of PAF seen on her monitor however she refused anticoagulation therapy.  She had a acute lateral MI in June 2021.  Cardiac catheterization at the time revealed occluded first diagonal with 30% mid LAD.  She had balloon angioplasty due to small vessel size.  She was readmitted with hypertensive urgency and acute heart failure in July 2021.  She underwent diuresis, however creatinine jumped, Lasix was subsequently discontinued.  Follow-up echocardiogram in July 2021 showed EF 50 to 55%, mild LAE,  mild aortic insufficiency.  She was noted to be in recurrent atrial fibrillation in December 2021. She was also not taking her diuretic at the time. Diuretic was resumed and Cardizem was added.  Patient was last seen by Dr. Stanford Breed on 03/08/2020 at which time she appears to be significantly volume overloaded.  She was felt to be in acute on chronic diastolic heart failure in the setting of uncontrolled heart rate related to atrial fibrillation.  She was finally agreeable to start on Eliquis 2.5 mg twice a day.  Echocardiogram obtained on 03/09/2020 showed EF 35 to 40%, mild LVH, moderately reduced RVEF, RVSP 69.2 mmHg, mild to moderate LAE.  She was started on IV Lasix and IV diltiazem to help with rate control and volume overload.  Transesophageal echocardiogram was planned to cardiovert her on the Eliquis, unfortunately she was found to have thrombus in the left atrial appendage, therefore DCCV was aborted.  Her discharge weight was 47.6 kg.  Since discharge, she continued to have breathing issues, diuretic was switched to torsemide 40 mg twice daily.  Since discharge, she has been diagnosed with pneumonia near the end of January and was started on doxycycline.  Patient presents today for follow-up.  She says she has dyspnea with minimal exertion.  She is quite frustrated with  her current symptom.  I suspect her shortness of breath is due to combination of pulmonary issue and atrial fibrillation.  I discussed her case with Dr. Stanford Breed.  We recommend she start on amiodarone therapy.  We also recommended TEE DCCV.  Unfortunately, the TEE DCCV require a Covid test at upcoming testing facility which she adamantly refused.  She says she relies on her sister to drive her to the facility and it is too far for them.  Unfortunately, [facility] policy does not allow Covid test result from local [facility] or [facility] pharmacy.  I have called the charge nurse in our endoscopy department, we can potentially arrange for her to  get the Covid test in the morning of the TEE DCCV as long as she can arrive 3 hours early to get the Covid test in her car.  She is still unwilling to proceed with the study at this point.  She is aware that as long as she remains in atrial fibrillation, her dyspnea on exertion may persist.  I will obtain a CBC and a basic metabolic panel today to make sure that her renal function has not changed on the higher dose of torsemide.       Past Medical History:  Diagnosis Date  . Anxiety   . Arthritis   . Atrial fibrillation ([place])   . B12 deficiency   . COPD (chronic obstructive pulmonary disease) ([place])   . Depression   . GERD (gastroesophageal reflux disease)   . Hypertension   . Hypoxia with need for home oxygen  03/14/2020  . [place] thrombus of left atrium 03/09/20 03/14/2020  . Osteoarthrosis, hand    both hands  . Peptic ulcer, unspecified site, unspecified as acute or chronic, without mention of hemorrhage, perforation, or obstruction   . Pneumonia   . PONV (postoperative nausea and vomiting)   . Vitamin D deficiency disease     Past Surgical History:  Procedure Laterality Date  . BLADDER SURGERY     Bladder tack and intestines  . CORONARY/GRAFT ACUTE MI REVASCULARIZATION N/A 08/26/2019   Procedure: Coronary/Graft Acute MI Revascularization;  Surgeon: Sherren Mocha, MD;  Location: [facility];  Service: Cardiovascular;  Laterality: N/A;  . CYSTOCELE REPAIR    . CYSTOSCOPY WITH RETROGRADE PYELOGRAM, URETEROSCOPY AND STENT PLACEMENT Right 09/11/2016   Procedure: CYSTOSCOPY WITH RETROGRADE PYELOGRAM, URETEROSCOPY AND STENT REPLACEMENT;  Surgeon: Cleon Gustin, MD;  Location: [facility];  Service: Urology;  Laterality: Right;  . CYSTOSCOPY WITH STENT PLACEMENT Right 08/11/2016   Procedure: CYSTOSCOPY, URETERSCOPY,  RIGHT RETROGRADE WITH RIGHT STENT PLACEMENT;  Surgeon: Festus Aloe, MD;  Location: [facility];  Service: Urology;  Laterality: Right;  .  SPLENECTOMY    . TEE WITHOUT CARDIOVERSION N/A 03/10/2020   Procedure: TRANSESOPHAGEAL ECHOCARDIOGRAM (TEE);  Surgeon: Josue Hector, MD;  Location: [facility];  Service: Cardiovascular;  Laterality: N/A;    Current Medications:     Current Meds  Medication Sig  . acetaminophen (TYLENOL) 325 MG tablet Take 2 tablets (650 mg total) by mouth every 4 (four) hours as needed for headache or mild pain. (Patient not taking: Reported on 04/27/2020)  . amiodarone (PACERONE) 200 MG tablet Take 1 tablet (200 mg total) by mouth daily.  Marland Kitchen apixaban (ELIQUIS) 2.5 MG TABS tablet Take 1 tablet (2.5 mg total) by mouth 2 (two) times daily.  . Biotin 1000 MCG tablet Take 1,000 mcg by mouth daily.  . Cholecalciferol 25 MCG (1000 UT) capsule Take 1,000 Units by  mouth daily.  . clonazePAM (KLONOPIN) 0.5 MG tablet Take 1 tablet (0.5 mg total) by mouth at bedtime as needed (leg cramps). TAKE 1 TABLET BY MOUTH AT BEDTIME AS NEEDED FOR LEG CRAMPS  . cyanocobalamin 1000 MCG tablet Take 1,000 mcg by mouth daily.  Marland Kitchen diltiazem (CARDIZEM CD) 120 MG 24 hr capsule Take 1 capsule (120 mg total) by mouth daily.  Marland Kitchen docusate sodium (COLACE) 100 MG capsule Take 1 capsule (100 mg total) by mouth daily as needed for mild constipation.  . hydrocerin (EUCERIN) CREA Apply 1 application topically 2 (two) times daily.  Marland Kitchen HYDROcodone-homatropine (HYCODAN) 5-1.5 MG/5ML syrup Take 5 mLs by mouth every 8 (eight) hours as needed for cough.  . isosorbide mononitrate (IMDUR) 30 MG 24 hr tablet Take 1 tablet (30 mg total) by mouth daily.  Marland Kitchen ketoconazole (NIZORAL) 2 % cream APPLY TOPICALLY TO THE AFFECTED AREA DAILY (Patient taking differently: Apply 1 application topically daily as needed for irritation.)  . levalbuterol (XOPENEX HFA) 45 MCG/ACT inhaler Inhale 2 puffs into the lungs every 4 (four) hours as needed for wheezing or shortness of breath.  . levothyroxine (SYNTHROID) 25 MCG tablet Take 25 mcg by mouth every morning.  .  metoprolol tartrate (LOPRESSOR) 100 MG tablet Take 1 tablet (100 mg total) by mouth 2 (two) times daily.  . nitroGLYCERIN (NITROSTAT) 0.4 MG SL tablet Place 1 tablet (0.4 mg total) under the tongue every 5 (five) minutes as needed for chest pain.  Marland Kitchen nystatin (MYCOSTATIN) 100000 UNIT/ML suspension Take 5 mLs (500,000 Units total) by mouth 4 (four) times daily.  . pantoprazole (PROTONIX) 40 MG tablet Take 1 tablet (40 mg total) by mouth daily.  . potassium chloride (KLOR-CON) 10 MEQ tablet Take 1 tablet (10 mEq total) by mouth daily.  Marland Kitchen Propylene Glycol (SYSTANE BALANCE) 0.6 % SOLN Place 1 drop into both eyes 2 (two) times daily as needed (dry eyes).  . rosuvastatin (CRESTOR) 10 MG tablet Take 1 tablet (10 mg total) by mouth daily.  Marland Kitchen torsemide (DEMADEX) 20 MG tablet Take 2 tablets (40 mg total) by mouth 2 (two) times daily.     Allergies:   Amoxicillin, Codeine, Levaquin [levofloxacin in d5w], and Other   Social History        Socioeconomic History  . Marital status: Divorced    Spouse name: Not on file  . Number of children: 2  . Years of education: Not on file  . Highest education level: Not on file  Occupational History  . Occupation: Airline pilot: [employer]  Tobacco Use  . Smoking status: Former Smoker    Packs/day: 1.00    Types: Cigarettes    Quit date: 06/03/2008    Years since quitting: 12.1  . Smokeless tobacco: Never Used  . Tobacco comment: Pt unsure how many years she has been smoking   Vaping Use  . Vaping Use: Never used  Substance and Sexual Activity  . Alcohol use: No  . Drug use: No  . Sexual activity: Not Currently  Other Topics Concern  . Not on file  Social History Narrative   Retired, works part time   Divorced   Former Smoker   1- Son alcoholic abusive   Daughter died in 2006/06/04   Social Determinants of Health      Financial Resource Strain: High Risk  . Difficulty of Paying Living Expenses: Very hard  Food Insecurity: Food  Insecurity Present  . Worried About Charity fundraiser in the  Last Year: Sometimes true  . Ran Out of Food in the Last Year: Sometimes true  Transportation Needs: No Transportation Needs  . Lack of Transportation (Medical): No  . Lack of Transportation (Non-Medical): No  Physical Activity: Unknown  . Days of Exercise per Week: Patient refused  . Minutes of Exercise per Session: Not on file  Stress: No Stress Concern Present  . Feeling of Stress : Not at all  Social Connections: Not on file     Family History: The patient's family history includes Arthritis in an other family member; Coronary artery disease in an other family member; Diabetes in her daughter; Heart disease in her father; Hypertension in an other family member.  ROS:   Please see the history of present illness.     All other systems reviewed and are negative.  EKGs/Labs/Other Studies Reviewed:    The following studies were reviewed today:  Echo 03/09/2020 1. Left ventricular ejection fraction, by estimation, is 35 to 40%. The  left ventricle has moderately decreased function. The left ventricle  demonstrates global hypokinesis. There is mild concentric left ventricular  hypertrophy. Left ventricular  diastolic function could not be evaluated.  2. Right ventricular systolic function is moderately reduced. The right  ventricular size is mildly enlarged. There is severely elevated pulmonary  artery systolic pressure. The estimated right ventricular systolic  pressure is 123456 mmHg.  3. Left atrial size was mild to moderately dilated.  4. Right atrial size was mildly dilated.  5. The mitral valve is grossly normal. Mild to moderate mitral valve  regurgitation. No evidence of mitral stenosis.  6. The aortic valve is tricuspid. There is mild calcification of the  aortic valve. There is mild thickening of the aortic valve. Aortic valve  regurgitation is mild. Mild aortic valve sclerosis is present, with no   evidence of aortic valve stenosis.  7. The inferior vena cava is dilated in size with <50% respiratory  variability, suggesting right atrial pressure of 15 mmHg.   EKG:  EKG is ordered today.  The ekg ordered today demonstrates Atrial fibrillation  Recent Labs: 03/08/2020: ALT 36; Magnesium 2.2; TSH 7.708 03/13/2020: B Natriuretic Peptide 3,757.7 04/27/2020: BUN 30; Creatinine, Ser 1.42; Hemoglobin 13.2; Platelets 408; Potassium 3.8; Sodium 140  Recent Lipid Panel    Component Value Date/Time   CHOL 239 (H) 08/26/2019 1240   TRIG 155 (H) 08/26/2019 1240   HDL 54 08/26/2019 1240   CHOLHDL 4.4 08/26/2019 1240   VLDL 31 08/26/2019 1240   LDLCALC 154 (H) 08/26/2019 1240   LDLDIRECT 144.3 01/31/2011 0905     Risk Assessment/Calculations:    CHA2DS2-VASc Score = 6  This indicates a 9.7% annual risk of stroke. The patient's score is based upon: CHF History: Yes HTN History: Yes Diabetes History: No Stroke History: No Vascular Disease History: Yes Age Score: 2 Gender Score: 1     Physical Exam:    VS:  BP (!) 142/82   Pulse 77   Ht '5\' 4"'$  (1.626 m)   Wt 103 lb (46.7 kg)   SpO2 94%   BMI 17.68 kg/m        Wt Readings from Last 3 Encounters:  04/27/20 103 lb (46.7 kg)  04/16/20 106 lb (48.1 kg)  03/14/20 104 lb 14.4 oz (47.6 kg)     GEN:  Well nourished, well developed in no acute distress HEENT: Normal NECK: No JVD; No carotid bruits LYMPHATICS: No lymphadenopathy CARDIAC: Irregularly irregular, no murmurs, rubs, gallops RESPIRATORY: Diminished  breath sound, no rales, wheezing or rhonchi. ABDOMEN: Soft, non-tender, non-distended MUSCULOSKELETAL:  No edema; No deformity  SKIN: Warm and dry NEUROLOGIC:  Alert and oriented x 3 PSYCHIATRIC:  Normal affect   ASSESSMENT:    1. Persistent atrial fibrillation ([place])   2. Medication management   3. Coronary artery disease involving native coronary artery of native heart without angina pectoris    4. Hyperlipidemia with target LDL less than 70   5. COPD with chronic bronchitis ([place])   6. Essential hypertension    PLAN:    In order of problems listed above:  1. Persistent atrial fibrillation: Patient continues to have significant dyspnea on minimal exertion.  I suspect this is related to combination of her pulmonary issue and atrial fibrillation.  We recommend start on amiodarone 200 mg daily.  We recommended outpatient TEE DCCV by Dr. Stanford Breed, however the patient is unwilling to drive to the [id number] facility designated by [facility]: Patient changed her mind and is willing to proceed with TEE DCCV and obtain the rapid Covid test 3 hours prior to the procedure.  I spoke with the endoscopy department, on the day of the procedure, she will arrive 3 hours early and called the endoscopy department.  They will send a nurse to her car to obtain the rapid Covid test.  She is to stay in her car until the result of the Covid test come back so she can proceed with TEE DCCV.             -She has been compliant with Eliquis however given the prior history of left atrial appendage clot, will proceed with TEE prior to DCCV.             -We likely will discontinue her diltiazem after the cardioversion and continue on metoprolol by itself.  2. CAD: Denies any chest pain.  3. Combined systolic and diastolic heart failure: Recent drop in the ejection fraction likely related to atrial fibrillation.  Prior to recent hospitalization, she was on 100 mg daily of torsemide. Interestingly, she was discharged on 80 mg daily of Lasix. Since discharge, Lasix has been switched back to torsemide 40 mg twice daily dosing.  She will need a basic metabolic panel today.  4. Hyperlipidemia: On Crestor  5. Hypertension: Continue on current therapy  6. COPD: Followed by pulmonology service   Shared Decision Making/Informed Consent The risks [stroke, cardiac arrhythmias rarely  resulting in the need for a temporary or permanent pacemaker, skin irritation or burns, esophageal damage, perforation (1:10,000 risk), bleeding, pharyngeal hematoma as well as other potential complications associated with conscious sedation including aspiration, arrhythmia, respiratory failure and death], benefits (treatment guidance, restoration of normal sinus rhythm, diagnostic support) and alternatives of a transesophageal echocardiogram guided cardioversion were discussed in detail with [name] and she is willing to proceed.       Medication Adjustments/Labs and Tests Ordered: Current medicines are reviewed at length with the patient today.  Concerns regarding medicines are outlined above.     Orders Placed This Encounter  Procedures  . Basic metabolic panel  . CBC  . EKG 12-Lead       Meds ordered this encounter  Medications  . amiodarone (PACERONE) 200 MG tablet    Sig: Take 1 tablet (200 mg total) by mouth daily.    Dispense:  90 tablet    Refill:  3    Patient Instructions  Medication Instructions:   START Amiodarone 200 mg 2 times a day  *If you need a refill on your cardiac medications before your next appointment, please call your pharmacy*  Lab Work: Your physician recommends that you return for lab work TODAY:   BMET  CBC  You will need a COVID-19  test prior to your procedure. This is a Drive Up Visit at [id number] [place], [place] [address].Someone will direct you to the appropriate testing line. Stay in your car and someone will be with you shortly.  If you have labs (blood work) drawn today and your tests are completely normal, you will receive your results only by:  [place] (if you have MyChart) OR  A paper copy in the mail If you have any lab test that is abnormal or we need to change your treatment, we will call you to review the results.  Testing/Procedures: Your physician has recommended that you have a  Cardioversion (DCCV). Electrical Cardioversion uses a jolt of electricity to your heart either through paddles or wired patches attached to your chest. This is a controlled, usually prescheduled, procedure. Defibrillation is done under light anesthesia in the hospital, and you usually go home the day of the procedure. This is done to get your heart back into a normal rhythm. You are not awake for the procedure. Please see the instruction sheet given to you today.  Scheduled for 05/11/20 at 7:30 AM   Follow-Up: At [facility], you and your health needs are our priority. As part of our continuing mission to provide you with exceptional heart care, we have created designated Provider Care Teams. These Care Teams include your primary Cardiologist (physician) and Advanced Practice Providers (APPs -  Physician Assistants and Nurse Practitioners) who all work together to provide you with the care you need, when you need it.  We recommend signing up for the patient portal called "MyChart".  Sign up information is provided on this After Visit Summary.  MyChart is used to connect with patients for Virtual Visits (Telemedicine).  Patients are able to view lab/test results, encounter notes, upcoming appointments, etc.  Non-urgent messages can be sent to your provider as well.   To learn more about what you can do with MyChart, go to [email or web address].    Your next appointment:   3-4 weeks   The format for your next appointment:   In Person  Provider:   Kirk Ruths, MD  Other Instructions     Dear [name] You are scheduled for a TEE/Cardioversion/TEE Cardioversion on Tuesday 05/11/20 with Dr. Kirk Ruths.  Please arrive at the [facility] (Main Entrance A) at [facility]: [address] [place], [place] [address] at 6:30 AM.   DIET: Nothing to eat or drink after midnight except a sip of water with medications (see medication instructions below)  Medication  Instructions: Hold Potassium the morning of procedure Hold Torsemide the morning of procedure   Continue your anticoagulant: Eliquis You will need to continue your anticoagulant after your procedure until you            are told by your  Provider that it is safe to stop   Labs: If patient is on Coumadin, patient needs pt/INR, CBC, BMET within 3 days (No pt/INR needed for patients taking Xarelto, Eliquis, Pradaxa) For patients receiving anesthesia for TEE and all Cardioversion patients: BMET, CBC within 1 week  Come to:  (Lab option #1) Come to the lab at [address] between the hours of 8:00 am and 4:30 pm. You do not have to be fasting. (Lab option #2) Lab at an alternate location (Lab option #3) your lab work will be done at the hospital prior to your procedure - you will need to arrive 1  hours ahead of your procedure  You must have a responsible person to drive you home and stay in the waiting area during your procedure. Failure to do so could result in cancellation.  Bring your insurance cards.  *Special Note: Every effort is made to have your procedure done on time. Occasionally there are emergencies that occur at the hospital that may cause delays. Please be patient if a delay does occur.      Hilbert Corrigan, [place]  04/28/2020 10:58 PM    [facility]       For TEE/DCCV; no changes; compliant with apixaban. Kirk Ruths

## 2020-05-11 NOTE — Progress Notes (Signed)
  Echocardiogram Echocardiogram Transesophageal has been performed.  Merrie Roof F 05/11/2020, 10:50 AM

## 2020-05-11 NOTE — Progress Notes (Signed)
    Transesophageal Echocardiogram Note  [name] [id number] [birth year]  Procedure: Transesophageal Echocardiogram Indications: Atrial fibrillation   Procedure Details Consent: Obtained Time Out: Verified patient identification, verified procedure, site/side was marked, verified correct patient position, special equipment/implants available, Radiology Safety Procedures followed,  medications/allergies/relevent history reviewed, required imaging and test results available.  Performed  Medications:  Pt sedated by anesthesia with diprovan 120 mg IV total.  Low normal LV function; no LAA thrombus; mild AI; moderate MR.  Pt subsequently underwent successful DCCV with 120J to NSR; atrial fibrillation recurred; second attempt with 150J resulted in sinus with pacs; continue apixaban.   Complications: No apparent complications Patient did tolerate procedure well.  Kirk Ruths, MD

## 2020-05-11 NOTE — Interval H&P Note (Signed)
History and Physical Interval Note:  05/11/2020 9:23 AM  [name]  has presented today for surgery, with the diagnosis of AFIB.  The various methods of treatment have been discussed with the patient and family. After consideration of risks, benefits and other options for treatment, the patient has consented to  Procedure(s): TRANSESOPHAGEAL ECHOCARDIOGRAM (TEE) (N/A) CARDIOVERSION (N/A) as a surgical intervention.  The patient's history has been reviewed, patient examined, no change in status, stable for surgery.  I have reviewed the patient's chart and labs.  Questions were answered to the patient's satisfaction.     Kirk Ruths

## 2020-05-11 NOTE — Anesthesia Preprocedure Evaluation (Addendum)
Anesthesia Evaluation  Patient identified by MRN, date of birth, ID band Patient awake    Reviewed: Allergy & Precautions, NPO status , Patient's Chart, lab work & pertinent test results  History of Anesthesia Complications (+) PONV  Airway Mallampati: I  TM Distance: >3 FB Neck ROM: Full    Dental  (+) Lower Dentures, Upper Dentures, Dental Advisory Given   Pulmonary pneumonia, COPD, former smoker,    Pulmonary exam normal        Cardiovascular hypertension, + CAD, + Past MI and +CHF   Rhythm:Irregular Rate:Tachycardia  IMPRESSIONS   1. Global hypokinesis worse in inferior wall. Left ventricular ejection fraction, by estimation, is 30 to 35%. The left ventricle has moderately decreased function. The left ventricle demonstrates global hypokinesis. The left ventricular internal cavity size was moderately dilated. Left ventricular diastolic function could not be evaluated. 2. Right ventricular systolic function is moderately reduced. The right ventricular size is mildly enlarged. 3. Dense spontaneous contrast. 1 x 1 cm acute/chronic LAA thrombus confirmed on orthogonal views and with definity . Left atrial size was moderately dilated. A left atrial/left atrial appendage thrombus was detected. 4. Right atrial size was mildly dilated. 5. The mitral valve is degenerative. Mild mitral valve regurgitation. Moderate mitral annular calcification. 6. The aortic valve is tricuspid. Aortic valve regurgitation is not visualized. Mild to moderate aortic valve sclerosis/calcification is present, without any evidence of aortic stenosis. 7. Pulmonic valve regurgitation is moderate.   Neuro/Psych  Headaches, PSYCHIATRIC DISORDERS Anxiety Depression    GI/Hepatic PUD, GERD  ,  Endo/Other    Renal/GU Renal disease     Musculoskeletal  (+) Arthritis , Osteoarthritis,    Abdominal   Peds  Hematology  (+) anemia ,   Anesthesia  Other Findings   Reproductive/Obstetrics                            Anesthesia Physical  Anesthesia Plan  ASA: III  Anesthesia Plan: MAC   Post-op Pain Management:    Induction:   PONV Risk Score and Plan: 2 and Propofol infusion, TIVA and Ondansetron  Airway Management Planned: Natural Airway and Simple Face Mask  Additional Equipment:   Intra-op Plan:   Post-operative Plan:   Informed Consent: I have reviewed the patients History and Physical, chart, labs and discussed the procedure including the risks, benefits and alternatives for the proposed anesthesia with the patient or authorized representative who has indicated his/her understanding and acceptance.     Dental advisory given  Plan Discussed with: Anesthesiologist and CRNA  Anesthesia Plan Comments:        Anesthesia Quick Evaluation

## 2020-05-11 NOTE — Discharge Instructions (Signed)
TEE  YOU HAD AN CARDIAC PROCEDURE TODAY: Refer to the procedure report and other information in the discharge instructions given to you for any specific questions about what was found during the examination. If this information does not answer your questions, please call [facility] office at [phone] to clarify.   DIET: Your first meal following the procedure should be a light meal and then it is ok to progress to your normal diet. A half-sandwich or bowl of soup is an example of a good first meal. Heavy or fried foods are harder to digest and may make you feel nauseous or bloated. Drink plenty of fluids but you should avoid alcoholic beverages for 24 hours. If you had a esophageal dilation, please see attached instructions for diet.    Transesophageal Echocardiogram Transesophageal echocardiogram (TEE) is a test that uses sound waves to take pictures of your heart. TEE is done by passing a small probe attached to a flexible tube down the part of the body that moves food from your mouth to your stomach (esophagus). The pictures give clear images of your heart. This can help your doctor see if there are problems with your heart. Tell a doctor about:  Any allergies you have.  All medicines you are taking. This includes vitamins, herbs, eye drops, creams, and over-the-counter medicines.  Any problems you or family members have had with anesthetic medicines.  Any blood disorders you have.  Any surgeries you have had.  Any medical conditions you have.  Any swallowing problems.  Whether you have or have had a blockage in the part of the body that moves food from your mouth to your stomach.  Whether you are pregnant or may be pregnant. What are the risks? In general, this is a safe procedure. But, problems may occur, such as:  Damage to nearby structures or organs.  A tear in the part of the body that moves food from your mouth to your stomach.  Irregular heartbeat.  Hoarse  voice or trouble swallowing.  Bleeding. What happens before the procedure? Medicines  Ask your doctor about changing or stopping: ? Your normal medicines. ? Vitamins, herbs, and supplements. ? Over-the-counter medicines.  Do not take aspirin or ibuprofen unless you are told to. General instructions  Follow instructions from your doctor about what you cannot eat or drink.  You will take out any dentures or dental retainers.  Plan to have a responsible adult take you home from the hospital or clinic.  Plan to have a responsible adult care for you for the time you are told after you leave the hospital or clinic. This is important. What happens during the procedure?  An IV will be put into one of your veins.  You may be given: ? A sedative. This medicine helps you relax. ? A medicine to numb the back of your throat. This may be sprayed or gargled.  Your blood pressure, heart rate, and breathing will be watched.  You may be asked to lie on your left side.  A bite block will be placed in your mouth. This keeps you from biting the tube.  The tip of the probe will be placed into the back of your mouth.  You will be asked to swallow.  Your doctor will take pictures of your heart.  The probe and bite block will be taken out after the test is done. The procedure may vary among doctors and hospitals.   What can I expect after the procedure?  You will be monitored until you leave the hospital or clinic. This includes checking your blood pressure, heart rate, breathing rate, and blood oxygen level.  Your throat may feel sore and numb. This will get better over time. You will not be allowed to eat or drink until the numbness has gone away.  It is common to have a sore throat for a day or two.  It is up to you to get the results of your procedure. Ask how to get your results when they are ready. Follow these instructions at home:  If you were given a sedative during your  procedure, do not drive or use machines until your doctor says that it is safe.  Return to your normal activities when your doctor says that it is safe.  Keep all follow-up visits. Summary  TEE is a test that uses sound waves to take pictures of your heart.  You will be given a medicine to help you relax.  Do not drive or use machines until your doctor says that it is safe. This information is not intended to replace advice given to you by your health care provider. Make sure you discuss any questions you have with your health care provider. Document Revised: 10/14/2019 Document Reviewed: 10/14/2019 Elsevier Patient Education  2021 [place].  Hospital doctor cardioversion is the delivery of a jolt of electricity to restore a normal rhythm to the heart. A rhythm that is too fast or is not regular keeps the heart from pumping well. In this procedure, sticky patches or metal paddles are placed on the chest to deliver electricity to the heart from a device. This procedure may be done in an emergency if:  There is low or no blood pressure as a result of the heart rhythm.  Normal rhythm must be restored as fast as possible to protect the brain and heart from further damage.  It may save a life. This may also be a scheduled procedure for irregular or fast heart rhythms that are not immediately life-threatening. Tell a health care provider about:  Any allergies you have.  All medicines you are taking, including vitamins, herbs, eye drops, creams, and over-the-counter medicines.  Any problems you or family members have had with anesthetic medicines.  Any blood disorders you have.  Any surgeries you have had.  Any medical conditions you have.  Whether you are pregnant or may be pregnant. What are the risks? Generally, this is a safe procedure. However, problems may occur, including:  Allergic reactions to medicines.  A blood clot that breaks free and  travels to other parts of your body.  The possible return of an abnormal heart rhythm within hours or days after the procedure.  Your heart stopping (cardiac arrest). This is rare. What happens before the procedure? Medicines  Your health care provider may have you start taking: ? Blood-thinning medicines (anticoagulants) so your blood does not clot as easily. ? Medicines to help stabilize your heart rate and rhythm.  Ask your health care provider about: ? Changing or stopping your regular medicines. This is especially important if you are taking diabetes medicines or blood thinners. ? Taking medicines such as aspirin and ibuprofen. These medicines can thin your blood. Do not take these medicines unless your health care provider tells you to take them. ? Taking over-the-counter medicines, vitamins, herbs, and supplements. General instructions  Follow instructions from your health care provider about eating or drinking restrictions.  Plan to have someone take you  home from the hospital or clinic.  If you will be going home right after the procedure, plan to have someone with you for 24 hours.  Ask your health care provider what steps will be taken to help prevent infection. These may include washing your skin with a germ-killing soap. What happens during the procedure?  An IV will be inserted into one of your veins.  Sticky patches (electrodes) or metal paddles may be placed on your chest.  You will be given a medicine to help you relax (sedative).  An electrical shock will be delivered. The procedure may vary among health care providers and hospitals.   What can I expect after the procedure?  Your blood pressure, heart rate, breathing rate, and blood oxygen level will be monitored until you leave the hospital or clinic.  Your heart rhythm will be watched to make sure it does not change.  You may have some redness on the skin where the shocks were given. Follow these  instructions at home:  Do not drive for 24 hours if you were given a sedative during your procedure.  Take over-the-counter and prescription medicines only as told by your health care provider.  Ask your health care provider how to check your pulse. Check it often.  Rest for 48 hours after the procedure or as told by your health care provider.  Avoid or limit your caffeine use as told by your health care provider.  Keep all follow-up visits as told by your health care provider. This is important. Contact a health care provider if:  You feel like your heart is beating too quickly or your pulse is not regular.  You have a serious muscle cramp that does not go away. Get help right away if:  You have discomfort in your chest.  You are dizzy or you feel faint.  You have trouble breathing or you are short of breath.  Your speech is slurred.  You have trouble moving an arm or leg on one side of your body.  Your fingers or toes turn cold or blue. Summary  Electrical cardioversion is the delivery of a jolt of electricity to restore a normal rhythm to the heart.  This procedure may be done right away in an emergency or may be a scheduled procedure if the condition is not an emergency.  Generally, this is a safe procedure.  After the procedure, check your pulse often as told by your health care provider. This information is not intended to replace advice given to you by your health care provider. Make sure you discuss any questions you have with your health care provider. Document Revised: 09/23/2018 Document Reviewed: 09/23/2018 Elsevier Patient Education  2021 [place]. ACTIVITY: Your care partner should take you home directly after the procedure. You should plan to take it easy, moving slowly for the rest of the day. You can resume normal activity the day after the procedure however YOU SHOULD NOT DRIVE, use power tools, machinery or perform tasks that involve climbing or  major physical exertion for 24 hours (because of the sedation medicines used during the test).   SYMPTOMS TO REPORT IMMEDIATELY: A cardiologist can be reached at any hour. Please call [phone] for any of the following symptoms:  Vomiting of blood or coffee ground material  New, significant abdominal pain  New, significant chest pain or pain under the shoulder blades  Painful or persistently difficult swallowing  New shortness of breath  Black, tarry-looking or red, bloody stools  FOLLOW UP:  Please also call with any specific questions about appointments or follow up tests.

## 2020-05-11 NOTE — Anesthesia Postprocedure Evaluation (Signed)
Anesthesia Post Note  Patient: [name]  Procedure(s) Performed: TRANSESOPHAGEAL ECHOCARDIOGRAM (TEE) (N/A ) CARDIOVERSION (N/A )     Patient location during evaluation: PACU Anesthesia Type: MAC Level of consciousness: awake and alert Pain management: pain level controlled Vital Signs Assessment: post-procedure vital signs reviewed and stable Respiratory status: spontaneous breathing and respiratory function stable Cardiovascular status: stable Postop Assessment: no apparent nausea or vomiting Anesthetic complications: no   No complications documented.  Last Vitals:  Vitals:   05/11/20 1100 05/11/20 1105  BP: (!) 153/48   Pulse: (!) 52 (!) 111  Resp: 17 (!) 24  Temp:    SpO2: 94% (!) 82%    Last Pain:  Vitals:   05/11/20 1105  TempSrc:   PainSc: 0-No pain                 Malayshia All DANIEL

## 2020-05-12 ENCOUNTER — Telehealth: Payer: Medicare Other

## 2020-05-12 ENCOUNTER — Encounter (HOSPITAL_COMMUNITY): Payer: Self-pay | Admitting: Cardiology

## 2020-05-13 ENCOUNTER — Telehealth: Payer: Self-pay | Admitting: Cardiology

## 2020-05-13 NOTE — Telephone Encounter (Signed)
Pt continues to complain of SOB and no energy Per pt weight is stable ,watches salt intake and is in NSR . Also note thyroid level abnormal Pt is on Levothyroxine not sure who is following .Will forward to [facility] for review and recommendations ./cy

## 2020-05-13 NOTE — Telephone Encounter (Signed)
Pt c/o Shortness Of Breath: STAT if SOB developed within the last 24 hours or pt is noticeably SOB on the phone  1. Are you currently SOB (can you hear that pt is SOB on the phone)? Patient states she is a little SOB right now because she gets a little SOB when taking, but she is not noticeable SOB    2. How long have you been experiencing SOB? Past few months, becoming gradually worse  3. Are you SOB when sitting or when up moving around? When up and moving around  4. Are you currently experiencing any other symptoms? No energy

## 2020-05-14 ENCOUNTER — Ambulatory Visit: Payer: Medicare Other | Admitting: *Deleted

## 2020-05-14 NOTE — Chronic Care Management (AMB) (Signed)
   Care Management    RN Visit Note  05/14/2020 Name: DEJANE PARRON MRN: [id number] DOB: 06-26-1935  Subjective: [name] is a 85 y.o. year old female who is a primary care patient of Plotnikov, Evie Lacks, MD. The care management team was consulted for assistance with disease management and care coordination needs.    Engaged with patient by telephone for initial visit in response to provider referral for case management and/or care coordination services.  Patient was recently followed by [facility] for care coordination needs.  Consent to Services:   Ms. Nedd was given information about Care Management services today including:  1. Care Management services includes personalized support from designated clinical staff supervised by her physician, including individualized plan of care and coordination with other care providers 2. 24/7 contact phone numbers for assistance for urgent and routine care needs. 3. The patient may stop case management services at any time by phone call to the office staff.  Patient does not agree to services and states that she is active with [facility] team who handles all of her care, as she is unable to travel to doctor appointments.   Plan: No further follow up required: patient active with [facility] and declines additional needs  Oneta Rack, RN, BSN, [place] [phone]: direct office [phone]: mobile

## 2020-05-14 NOTE — Patient Instructions (Signed)
Visit Information  Thank you for allowing me to share the care management and care coordination services that are available to you as part of your health plan and services through your primary care provider and medical home. Please reach out to me at [phone] if the care management/care coordination team may be of assistance to you in the future.   Oneta Rack, RN, BSN, [facility] RN Care Coordination- [place] [phone]: direct office [phone]: mobile

## 2020-05-17 NOTE — Telephone Encounter (Signed)
Call originated 3/10 TEE was on 3/8  Routed to MD for advice on when to follow up and to review previously noted concerns per Altha Harm LPN's note

## 2020-05-17 NOTE — Telephone Encounter (Signed)
FU APPov Kirk Ruths

## 2020-05-17 NOTE — Telephone Encounter (Signed)
Follow up scheduled

## 2020-05-17 NOTE — Telephone Encounter (Signed)
    Pt is calling back to follow up, she also wanted to know if she needs a follow up after her TEE

## 2020-05-18 ENCOUNTER — Telehealth: Payer: Self-pay | Admitting: Pulmonary Disease

## 2020-05-18 DIAGNOSIS — J441 Chronic obstructive pulmonary disease with (acute) exacerbation: Secondary | ICD-10-CM

## 2020-05-18 MED ORDER — AZITHROMYCIN 250 MG PO TABS: 250.0000 mg | ORAL_TABLET | Freq: Every day | ORAL | 0 refills | Status: DC

## 2020-05-18 NOTE — Telephone Encounter (Signed)
Called patient. She reports worsening shortness of breath, sputum production and cough. Associated with chest congestion. Denies fevers, chills or chest pain. Reports wearing oxygen 24/7 however levels never <99%. No distress on the phone. Able to complete her sentences.  We discussed treatment for COPD exacerbation. She declined steroids. Will rx azithromycin. I advised her to contact our office if symptoms do not improve after 48 hours after starting medications to request visit. If her symptoms become urgent, I have advised her to be evaluated in the ED.  Assessment COPD exacerbation  Plan Azithromycin Start mucinex as directed  Rodman Pickle, M.D. [facility] Pulmonary/Critical Care Medicine 05/18/2020 1:19 PM

## 2020-05-18 NOTE — Telephone Encounter (Signed)
Call returned to patient, confirmed DOB. Patient reports she is more SOB than her normal. She states everybody keeps saying its emphysema. She said she didn't know it just gets worse over night.  She reports she is using her trelegy and xopenex and it does not help. She thinks her mucous is the problem. She reports she cant get it up and it makes it harder to breath. She is using her oxygen 24-7. Reports her o2 is 99%. Patient able to speak in complete sentences. No acute distress.   JE please advise.

## 2020-05-20 ENCOUNTER — Telehealth: Payer: Self-pay | Admitting: Cardiology

## 2020-05-20 ENCOUNTER — Telehealth: Payer: Self-pay | Admitting: Internal Medicine

## 2020-05-20 NOTE — Telephone Encounter (Signed)
Pt aware of recommendations and verbalizes understanding ./cy 

## 2020-05-20 NOTE — Telephone Encounter (Signed)
Pt c/o medication issue:  1. Name of Medication: Hydroxyzine  2. How are you currently taking this medication (dosage and times per day)? Patient has not taken this medication  3. Are you having a reaction (difficulty breathing--STAT)? No   4. What is your medication issue?   Patient states she has been extremely anxious lately and her home health nurse is recommending this medication. Patient would like to be sure this medication will not interfere with any cardiac medications. She states she would also like to discuss Crestor when she received a call back, but she did not no into detail.

## 2020-05-20 NOTE — Telephone Encounter (Signed)
Okay to take hydroxyzine as prescribed.   Wait until follow up with Dr Stanford Breed in April to determine if Crestor can be discontinued, unless discontinued by Primary care for adverse event or intolerance.

## 2020-05-20 NOTE — Telephone Encounter (Signed)
Patient calling, states she was given an antibiotic by her cardiologist and she read the directions wrong and took to much of it. Transferred to [facility] for further evaluation.

## 2020-05-20 NOTE — Telephone Encounter (Signed)
Will forward to Pharmacy to review if med ( Hydroxyzine ) will interfere with current medications Also pt stated she needs to get portable O2 tank so may leave the house Per pt this may help with anxiety ,also was told Thyroid is off , and also was told did no t have to take Crestor Pt aware needs to contact PCP  Re other questions as Dr Stanford Breed does not order O2 ,follow TSH , and not sure who is following cholesterol last values were from last summer ./cy

## 2020-05-21 NOTE — Telephone Encounter (Signed)
Called pt inform her she need to call her cardiologist who rx medication. Dr. Alain Marion is out of the office until Monday 21 st..lmb

## 2020-05-21 NOTE — Telephone Encounter (Signed)
[facility]   Caller Honour Wachs, was given antibiotics and read the directions wrong and took possibly '500mg'$  too many.   Advised to call PCP.

## 2020-05-24 ENCOUNTER — Telehealth: Payer: Self-pay | Admitting: Cardiology

## 2020-05-24 NOTE — Telephone Encounter (Signed)
Spoke with pt, she reports she is only taking the torsemide once daily and she has swelling in her feet and legs. Advised patient to take the 40 mg of torsemide twice daily and to elevated legs as much as possible. She reports mucus in her throat and that makes it hard for her to breathe. She reports some indigestion at times. Advised the patient to increase the protonix to twice daily for a couple weeks to see if that helps with the mucus. She will call back with concerns or problems.

## 2020-05-24 NOTE — Telephone Encounter (Signed)
Pt c/o swelling: STAT is pt has developed SOB within 24 hours  1) How much weight have you gained and in what time span? Pt states that she lost weight  2) If swelling, where is the swelling located? From feet to knee's  3) Are you currently taking a fluid pill? Yes, torsemide but she is not sure of the instructions. She cannot remember what Dr. Stanford Breed changed the directions to.  4) Are you currently SOB?  Yes - for about a month or two  5) Do you have a log of your daily weights (if so, list)? 106, 103  6) Have you gained 3 pounds in a day or 5 pounds in a week? no  7) Have you traveled recently? no   Patient wants to know how to tell if there is fluid building around her heart. She also wants to know the directions for her torsemide.

## 2020-05-26 ENCOUNTER — Telehealth: Payer: Self-pay | Admitting: Pulmonary Disease

## 2020-05-26 NOTE — Telephone Encounter (Signed)
Called and spoke with patient, she states that she does not have Medicare part D and does not want it because it does not pay for Trelegy.  She is still using the xopenex and has all the inhalers that she needs at this time.  She states she went and got her booster for covid and her arm hurts today.  She is taking Tylenol for the pain with little relief.  Advised to use some heat and see if that helps.  Advised to let us know if she needs anything further.  She verbalized understanding.  Nothing further needed.

## 2020-05-28 ENCOUNTER — Telehealth: Payer: Self-pay | Admitting: Pulmonary Disease

## 2020-05-28 NOTE — Telephone Encounter (Signed)
Left message for patient to call back  

## 2020-05-29 DIAGNOSIS — J449 Chronic obstructive pulmonary disease, unspecified: Secondary | ICD-10-CM | POA: Diagnosis not present

## 2020-05-29 DIAGNOSIS — J438 Other emphysema: Secondary | ICD-10-CM | POA: Diagnosis not present

## 2020-05-31 NOTE — Telephone Encounter (Signed)
Called and spoke with patient. She stated that she was calling back because she had not heard anything about the CXR she requested last month. I advised her that we had gotten her scheduled for a televisit with Beth last month as JE had asked but she had declined the visit. She states she does not remember this.   She is requesting a CXR and an explanation on her emphysema. I attempted to get her scheduled for an OV but she refused.   JE, do you have any suggestions? Thanks.

## 2020-06-01 NOTE — Telephone Encounter (Signed)
On review of records, patient called office on 3/15 requesting CXR and I had returned her call to discuss her symptoms. She did not require CXR at that time and had agreed to management for COPD exacerbation including contacting the office if her symptoms did not resolve/improve within 48 hours. We had also discussed the clinical course of COPD during that telephone visit.   If she is not taking any of the recommended inhalers, we will need her to come in for a visit with NP to discuss best inhaler options to manage her COPD. If symptoms warrant CXR, we can also get it at that time. Please investigate why she refuses office visit. Does she need transportation? At the very least she needs a telephone visit with NP.  Rodman Pickle, M.D. [facility] Pulmonary/Critical Care Medicine 06/01/2020 7:14 AM

## 2020-06-01 NOTE — Telephone Encounter (Signed)
Called and spoke with Patient. Patient stated she is using Trelegy and Xopenex inhalers everyday. Patient stated she is having increased dry mouth, which she feels makes it harder for her to use her inhalers. Patient stated she is using O2 as needed. Patient stated she is having increased fatigue and dyspnea. Patient stated she slept with her O2 past 2 nights, because she felt winded. Patient stated it is hard to come in the office, because she feels increase fatigue. Patient stated she has to sit and rest more frequently. Patient stated she would like to discuss how bad her emphysema is and how bad it will get. Patient is scheduled televisit with Tammy, NP 06/07/20, at 1130 to review inhalers, medications, and emphysema.

## 2020-06-07 ENCOUNTER — Encounter: Payer: Self-pay | Admitting: Adult Health

## 2020-06-07 ENCOUNTER — Ambulatory Visit (INDEPENDENT_AMBULATORY_CARE_PROVIDER_SITE_OTHER): Payer: Medicare Other | Admitting: Adult Health

## 2020-06-07 ENCOUNTER — Other Ambulatory Visit: Payer: Self-pay

## 2020-06-07 ENCOUNTER — Telehealth: Payer: Self-pay

## 2020-06-07 DIAGNOSIS — J9611 Chronic respiratory failure with hypoxia: Secondary | ICD-10-CM

## 2020-06-07 DIAGNOSIS — J438 Other emphysema: Secondary | ICD-10-CM

## 2020-06-07 NOTE — Progress Notes (Signed)
Virtual Visit via Telephone Note  I connected with [name] on 06/07/20 at 11:30 AM EDT by telephone and verified that I am speaking with the correct person using two identifiers.  Location: Patient: Home  Provider: Office    I discussed the limitations, risks, security and privacy concerns of performing an evaluation and management service by telephone and the availability of in person appointments. I also discussed with the patient that there may be a patient responsible charge related to this service. The patient expressed understanding and agreed to proceed.   History of Present Illness: 85 year old female former smoker seen for reconsult for shortness of breath and COPD with emphysema with Dr. Loanne Drilling on April 16, 2020.  Patient is on chronic oxygen at home.  She is a former smoker.   Today's telemedicine visit is a 80-monthfollow-up for COPD and oxygen dependent respiratory failure.  Last visit patient was reconsulted for COPD with emphysema.  She was recommended continue on Trelegy inhaler daily.  She was started on Xopenex as needed for wheezing and shortness of breath.  She was continued on her oxygen therapy. Uses Oxygen 3l/m .  Patient complains that she continues to get short of breath with activities.  Has low energy level.  Has decreased activity tolerance.  Previous CT chest in 2018 showed moderate to severe emphysema.  No previous PFTs are on record.  Last chest x-ray showed bilateral pleural effusions. Continues to lose weight , down 20lbs over last 2 months. Appetite is low . Tries to eat but has not appetite .  She was hospitalized in January 2022 with decompensated congestive heart failure.  She did require aggressive IV diuresis.  Had recently been treated for pneumonia at the end of January. Lives at home alone. Does not drive.  Followed by cardiology for CHF she says that her lower extremity swelling is much improved on her current regimen.  She does have A. fib and  was Started on Amiodarone recently .    Observations/Objective: Transesophageal echo May 11, 2020 showed EF at 45 to 50%, LV with mild decreased function.  Right ventricular size is normal with normal systolic function.  Left atrium severely dilated.  Negative for left atrial appendage thrombus.  Mild plaque involving the descending aorta.  CXR 03/08/20 - Moderate bilateral pleural effusions, atelectasis  CT Chest 01/01/17 - Severe centrilobular emphysema, diffuse bronchial wall thickening, biapical calcifications present since 2007 and unchanged, LLL scarring  Goals of care: DNR   Assessment and Plan: Severe COPD and emphysema.  Would like to get pulmonary function testing to establish baseline lung function. For now continue on triple therapy.  Repeat chest x-ray on return visit. Patient has high symptom burden.  May benefit from palliative care consult however would like to see patient in person in the office and establish lung function baseline before referral  Chronic oxygen dependent respiratory failure.  Patient continue on oxygen to maintain O2 saturations greater than 88 to 90%. Needs a POC device.  As she does not have any portable tanks.  Plan  Patient Instructions  Continue on TRELEGY 1 puff daily, rinse after use.  Continue on Oxygen 3l/m , goal is to have oxygen >88-90%.  Will look at paperwork for POC oxygen device .  Follow up in office in 1-2  weeks  with chest xray and PFT  Please contact office for sooner follow up if symptoms do not improve or worsen or seek emergency care  Follow Up Instructions:    I discussed the assessment and treatment plan with the patient. The patient was provided an opportunity to ask questions and all were answered. The patient agreed with the plan and demonstrated an understanding of the instructions.   The patient was advised to call back or seek an in-person evaluation if the symptoms worsen or if the condition  fails to improve as anticipated.  I provided 22  minutes of non-face-to-face time during this encounter.   Rexene Edison, NP

## 2020-06-07 NOTE — Addendum Note (Signed)
Addended by: Geanie Logan on: 06/07/2020 02:29 PM   Modules accepted: Orders

## 2020-06-07 NOTE — Patient Instructions (Addendum)
Continue on TRELEGY 1 puff daily, rinse after use.  Continue on Oxygen 3l/m , goal is to have oxygen >88-90%.  Will look at paperwork for POC oxygen device .  Follow up in office in 1-2  weeks  with chest xray and PFT  Please contact office for sooner follow up if symptoms do not improve or worsen or seek emergency care

## 2020-06-07 NOTE — Telephone Encounter (Signed)
Scheduled for follow up appt at patient's earliest convenience. She didn't want to schedule for a PFT at this time.

## 2020-06-09 ENCOUNTER — Telehealth: Payer: Self-pay | Admitting: Adult Health

## 2020-06-10 ENCOUNTER — Ambulatory Visit: Payer: Medicare Other | Admitting: Internal Medicine

## 2020-06-10 NOTE — Telephone Encounter (Signed)
Nothing noted in message. Will close encounter.  

## 2020-06-11 ENCOUNTER — Telehealth: Payer: Self-pay | Admitting: Primary Care

## 2020-06-11 DIAGNOSIS — J449 Chronic obstructive pulmonary disease, unspecified: Secondary | ICD-10-CM

## 2020-06-11 NOTE — Telephone Encounter (Signed)
I have called the pt and she is aware that the cxr will be visible in the system.

## 2020-06-11 NOTE — Telephone Encounter (Signed)
Spoke with the pt  She just had televisit with TP on 06/07/20 and was advised scheduled ov with pft and cxr 1-2 wks  While looking to schedule these appts her phone cut off  Called her back and she picked up and said her phone was almost dead and then hung up  Will call back later  If she calls back please make these appts

## 2020-06-14 ENCOUNTER — Other Ambulatory Visit: Payer: Self-pay | Admitting: Internal Medicine

## 2020-06-14 ENCOUNTER — Encounter: Payer: Self-pay | Admitting: Internal Medicine

## 2020-06-14 ENCOUNTER — Ambulatory Visit (INDEPENDENT_AMBULATORY_CARE_PROVIDER_SITE_OTHER): Payer: Medicare Other | Admitting: Internal Medicine

## 2020-06-14 ENCOUNTER — Other Ambulatory Visit: Payer: Self-pay

## 2020-06-14 DIAGNOSIS — R634 Abnormal weight loss: Secondary | ICD-10-CM

## 2020-06-14 DIAGNOSIS — I5031 Acute diastolic (congestive) heart failure: Secondary | ICD-10-CM

## 2020-06-14 DIAGNOSIS — E538 Deficiency of other specified B group vitamins: Secondary | ICD-10-CM

## 2020-06-14 DIAGNOSIS — H539 Unspecified visual disturbance: Secondary | ICD-10-CM | POA: Diagnosis not present

## 2020-06-14 LAB — COMPREHENSIVE METABOLIC PANEL
ALT: 17 U/L (ref 0–35)
AST: 32 U/L (ref 0–37)
Albumin: 3.5 g/dL (ref 3.5–5.2)
Alkaline Phosphatase: 62 U/L (ref 39–117)
BUN: 36 mg/dL — ABNORMAL HIGH (ref 6–23)
CO2: 40 mEq/L — ABNORMAL HIGH (ref 19–32)
Calcium: 9.8 mg/dL (ref 8.4–10.5)
Chloride: 88 mEq/L — ABNORMAL LOW (ref 96–112)
Creatinine, Ser: 2.14 mg/dL — ABNORMAL HIGH (ref 0.40–1.20)
GFR: 21.07 mL/min — ABNORMAL LOW (ref >60.00)
Glucose, Bld: 98 mg/dL (ref 70–99)
Potassium: 4.4 mEq/L (ref 3.5–5.1)
Sodium: 136 mEq/L (ref 135–145)
Total Bilirubin: 0.6 mg/dL (ref 0.2–1.2)
Total Protein: 7.1 g/dL (ref 6.0–8.3)

## 2020-06-14 LAB — TSH: TSH: 4.55 u[IU]/mL — ABNORMAL HIGH (ref 0.35–4.50)

## 2020-06-14 LAB — VITAMIN B12: Vitamin B-12: >1506 pg/mL — ABNORMAL HIGH (ref 211–911)

## 2020-06-14 LAB — T4, FREE: Free T4: 1.61 ng/dL — ABNORMAL HIGH (ref 0.60–1.60)

## 2020-06-14 MED ORDER — MEGESTROL ACETATE 400 MG/10ML PO SUSP: 200.0000 mg | Freq: Two times a day (BID) | ORAL | 1 refills | Status: AC

## 2020-06-14 NOTE — Assessment & Plan Note (Signed)
?  amidarone related vs other - temporary She will d/w Cardiology next week

## 2020-06-14 NOTE — Progress Notes (Signed)
Subjective:  Patient ID: [name], female    DOB: March 27, 1935  Age: 85 y.o. MRN: [id number]  CC: Weight Loss (Pt states she has no appetite.Marland Kitchen)   HPI [name] presents for wt loss, A fib, anxiety f/u Pt lost her bottom denture twice  Outpatient Medications Prior to Visit  Medication Sig Dispense Refill  . acetaminophen (TYLENOL) 325 MG tablet Take 2 tablets (650 mg total) by mouth every 4 (four) hours as needed for headache or mild pain.    Marland Kitchen amiodarone (PACERONE) 200 MG tablet Take 1 tablet (200 mg total) by mouth daily. 90 tablet 3  . apixaban (ELIQUIS) 2.5 MG TABS tablet Take 1 tablet (2.5 mg total) by mouth 2 (two) times daily. 60 tablet 6  . Biotin 1000 MCG tablet Take 1,000 mcg by mouth daily.    . Cholecalciferol 25 MCG (1000 UT) capsule Take 1,000 Units by mouth daily.    . clonazePAM (KLONOPIN) 0.5 MG tablet Take 1 tablet (0.5 mg total) by mouth at bedtime as needed (leg cramps). TAKE 1 TABLET BY MOUTH AT BEDTIME AS NEEDED FOR LEG CRAMPS    . cyanocobalamin 1000 MCG tablet Take 1,000 mcg by mouth daily.    Marland Kitchen diltiazem (CARDIZEM CD) 120 MG 24 hr capsule Take 1 capsule (120 mg total) by mouth daily. 30 capsule 6  . docusate sodium (COLACE) 100 MG capsule Take 1 capsule (100 mg total) by mouth daily as needed for mild constipation.    . Fluticasone-Umeclidin-Vilant (TRELEGY ELLIPTA) 100-62.5-25 MCG/INH AEPB Inhale 1 Inhaler into the lungs daily. 3 each 3  . hydrocerin (EUCERIN) CREA Apply 1 application topically 2 (two) times daily.  0  . HYDROcodone-homatropine (HYCODAN) 5-1.5 MG/5ML syrup Take 5 mLs by mouth every 8 (eight) hours as needed for cough. 120 mL 0  . isosorbide mononitrate (IMDUR) 30 MG 24 hr tablet Take 1 tablet (30 mg total) by mouth daily. 30 tablet 6  . ketoconazole (NIZORAL) 2 % cream APPLY TOPICALLY TO THE AFFECTED AREA DAILY (Patient taking differently: Apply 1 application topically daily as needed for irritation.) 45 g 1  . levalbuterol (XOPENEX HFA) 45  MCG/ACT inhaler Inhale 2 puffs into the lungs every 4 (four) hours as needed for wheezing or shortness of breath. 1 each 12  . levothyroxine (SYNTHROID) 25 MCG tablet Take 25 mcg by mouth every morning.    . metoprolol tartrate (LOPRESSOR) 100 MG tablet Take 1 tablet (100 mg total) by mouth 2 (two) times daily. 60 tablet 6  . nitroGLYCERIN (NITROSTAT) 0.4 MG SL tablet Place 1 tablet (0.4 mg total) under the tongue every 5 (five) minutes as needed for chest pain. 25 tablet 0  . nystatin (MYCOSTATIN) 100000 UNIT/ML suspension Take 5 mLs (500,000 Units total) by mouth 4 (four) times daily. 60 mL 0  . pantoprazole (PROTONIX) 40 MG tablet Take 1 tablet (40 mg total) by mouth daily. 30 tablet 6  . potassium chloride (KLOR-CON) 10 MEQ tablet Take 1 tablet (10 mEq total) by mouth daily. 30 tablet 6  . Propylene Glycol (SYSTANE BALANCE) 0.6 % SOLN Place 1 drop into both eyes 2 (two) times daily as needed (dry eyes).    . rosuvastatin (CRESTOR) 10 MG tablet Take 1 tablet (10 mg total) by mouth daily. 90 tablet 3  . torsemide (DEMADEX) 20 MG tablet Take 2 tablets (40 mg total) by mouth 2 (two) times daily. 60 tablet 6  . potassium chloride (KLOR-CON) 10 MEQ tablet Take 10 mEq by  mouth 2 (two) times daily.    Marland Kitchen azithromycin (ZITHROMAX) 250 MG tablet Take 1 tablet (250 mg total) by mouth daily. (Patient not taking: No sig reported) 6 tablet 0   No facility-administered medications prior to visit.    ROS: Review of Systems  Constitutional: Positive for fatigue and unexpected weight change. Negative for activity change, appetite change and chills.  HENT: Negative for congestion, mouth sores and sinus pressure.   Eyes: Positive for visual disturbance.  Respiratory: Negative for cough and chest tightness.   Gastrointestinal: Negative for abdominal pain and nausea.  Genitourinary: Negative for difficulty urinating, frequency and vaginal pain.  Musculoskeletal: Positive for arthralgias and gait problem.  Negative for back pain.  Skin: Negative for pallor and rash.  Neurological: Positive for weakness. Negative for dizziness, tremors, numbness and headaches.  Psychiatric/Behavioral: Positive for decreased concentration and dysphoric mood. Negative for confusion and sleep disturbance. The patient is nervous/anxious.     Objective:  BP 130/70 (BP Location: Left Arm)   Pulse (!) 46   Temp 97.6 F (36.4 C) (Oral)   Ht '5\' 4"'$  (1.626 m)   Wt 98 lb 9.6 oz (44.7 kg)   SpO2 93%   BMI 16.92 kg/m   BP Readings from Last 3 Encounters:  06/14/20 130/70  05/11/20 (!) 153/48  04/27/20 (!) 142/82    Wt Readings from Last 3 Encounters:  06/14/20 98 lb 9.6 oz (44.7 kg)  05/11/20 103 lb (46.7 kg)  04/27/20 103 lb (46.7 kg)    Physical Exam Constitutional:      General: She is not in acute distress.    Appearance: She is well-developed.  HENT:     Head: Normocephalic.     Right Ear: External ear normal.     Left Ear: External ear normal.     Nose: Nose normal.  Eyes:     General:        Right eye: No discharge.        Left eye: No discharge.     Conjunctiva/sclera: Conjunctivae normal.     Pupils: Pupils are equal, round, and reactive to light.  Neck:     Thyroid: No thyromegaly.     Vascular: No JVD.     Trachea: No tracheal deviation.  Cardiovascular:     Rate and Rhythm: Normal rate and regular rhythm.     Heart sounds: Normal heart sounds.  Pulmonary:     Effort: No respiratory distress.     Breath sounds: No stridor. No wheezing.  Abdominal:     General: Bowel sounds are normal. There is no distension.     Palpations: Abdomen is soft. There is no mass.     Tenderness: There is no abdominal tenderness. There is no guarding or rebound.  Musculoskeletal:        General: No tenderness.     Cervical back: Normal range of motion and neck supple.  Lymphadenopathy:     Cervical: No cervical adenopathy.  Skin:    Findings: No erythema or rash.  Neurological:     Mental Status:  She is oriented to person, place, and time.     Cranial Nerves: No cranial nerve deficit.     Motor: No abnormal muscle tone.     Coordination: Coordination abnormal.     Gait: Gait abnormal.     Deep Tendon Reflexes: Reflexes normal.  Psychiatric:        Behavior: Behavior normal.        Thought Content: Thought  content normal.        Judgment: Judgment normal.     Lab Results  Component Value Date   WBC 11.3 (H) 04/27/2020   HGB 15.6 (H) 05/11/2020   HCT 46.0 05/11/2020   PLT 408 04/27/2020   GLUCOSE 90 05/11/2020   CHOL 239 (H) 08/26/2019   TRIG 155 (H) 08/26/2019   HDL 54 08/26/2019   LDLDIRECT 144.3 01/31/2011   LDLCALC 154 (H) 08/26/2019   ALT 36 03/08/2020   AST 52 (H) 03/08/2020   NA 133 (L) 05/11/2020   K 4.9 05/11/2020   CL 90 (L) 05/11/2020   CREATININE 1.70 (H) 05/11/2020   BUN 49 (H) 05/11/2020   CO2 29 04/27/2020   TSH 7.708 (H) 03/08/2020   INR 1.0 09/15/2019   HGBA1C 6.3 (H) 08/26/2019    ECHO TEE  Result Date: 05/11/2020    TRANSESOPHOGEAL ECHO REPORT   Patient Name:   STAISHA VICARY Date of Exam: 05/11/2020 Medical Rec #:  [id number]     Height:       64.0 in Accession #:    [id number]    Weight:       103.0 lb Date of Birth:  09-01-1935     BSA:          1.476 m Patient Age:    61 years      BP:           153/48 mmHg Patient Gender: F             HR:           66 bpm. Exam Location:  Inpatient Procedure: Transesophageal Echo, Cardiac Doppler and Color Doppler Indications:     I48.91* Unspeicified atrial fibrillation  History:         Patient has prior history of Echocardiogram examinations, most                  recent 03/10/2020.  Sonographer:     Merrie Roof RDCS Referring Phys:  [id number] HAO MENG Diagnosing Phys: Kirk Ruths MD PROCEDURE: The transesophogeal probe was passed without difficulty through the esophogus of the patient. Sedation performed by different physician. The patient was monitored while under deep sedation. Anesthestetic sedation was provided  intravenously by Anesthesiology: '120mg'$  of Propofol. The patient developed no complications during the procedure. IMPRESSIONS  1. Left ventricular ejection fraction, by estimation, is 45 to 50%. The left ventricle has mildly decreased function.  2. Right ventricular systolic function is normal. The right ventricular size is normal.  3. Left atrial size was severely dilated. No left atrial/left atrial appendage thrombus was detected.  4. Right atrial size was mildly dilated.  5. The mitral valve is normal in structure. Moderate mitral valve regurgitation.  6. The aortic valve is tricuspid. Aortic valve regurgitation is mild.  7. There is mild (Grade II) plaque involving the descending aorta. FINDINGS  Left Ventricle: Left ventricular ejection fraction, by estimation, is 45 to 50%. The left ventricle has mildly decreased function. The left ventricular internal cavity size was normal in size. Right Ventricle: The right ventricular size is normal. Right vetricular wall thickness was not assessed. Right ventricular systolic function is normal. Left Atrium: Left atrial size was severely dilated. No left atrial/left atrial appendage thrombus was detected. Right Atrium: Right atrial size was mildly dilated. Pericardium: There is no evidence of pericardial effusion. Mitral Valve: The mitral valve is normal in structure. Moderate mitral valve regurgitation. Tricuspid Valve: The tricuspid valve is normal  in structure. Tricuspid valve regurgitation is mild. Aortic Valve: The aortic valve is tricuspid. Aortic valve regurgitation is mild. Pulmonic Valve: The pulmonic valve was normal in structure. Pulmonic valve regurgitation is trivial. Aorta: The aortic root is normal in size and structure. There is mild (Grade II) plaque involving the descending aorta. IAS/Shunts: No atrial level shunt detected by color flow Doppler. Kirk Ruths MD Electronically signed by Kirk Ruths MD Signature Date/Time: 05/11/2020/11:40:07 AM    Final      Assessment & Plan:   There are no diagnoses linked to this encounter.   No orders of the defined types were placed in this encounter.    Follow-up: No follow-ups on file.  Walker Kehr, MD

## 2020-06-14 NOTE — Assessment & Plan Note (Signed)
On B12 

## 2020-06-14 NOTE — Assessment & Plan Note (Signed)
Pt lost her bottom denture twice [employer] Try Megace  Potential benefits of a long term anabolic steroid  use as well as potential risks  and complications were explained to the patient and were aknowledged.

## 2020-06-14 NOTE — Assessment & Plan Note (Signed)
Check BMET

## 2020-06-14 NOTE — Addendum Note (Signed)
Addended by: Jacobo Forest on: 06/14/2020 02:53 PM   Modules accepted: Orders

## 2020-06-14 NOTE — Patient Instructions (Addendum)
Wt Readings from Last 3 Encounters:  06/14/20 98 lb 9.6 oz (44.7 kg)  05/11/20 103 lb (46.7 kg)  04/27/20 103 lb (46.7 kg)    Get a hand blender

## 2020-06-16 ENCOUNTER — Other Ambulatory Visit: Payer: Self-pay | Admitting: Internal Medicine

## 2020-06-16 DIAGNOSIS — R634 Abnormal weight loss: Secondary | ICD-10-CM

## 2020-06-16 DIAGNOSIS — R002 Palpitations: Secondary | ICD-10-CM

## 2020-06-16 NOTE — Progress Notes (Signed)
HPI: FU CAD and paroxysmal atrial fibrillation. Patient had acute lateral myocardial infarction June 2021. Cardiac catheterization revealed an occluded first diagonal and 30% mid LAD. Patient had balloon angioplasty due to small vessel size. Follow-up echocardiogram July 2021 showed ejection fraction 50 to 55%, mild left atrial enlargement and mild aortic insufficiency. Patient noted to be in recurrent atrial fibrillation December 2021. Patient was symptomatic with worsening CHF symptoms. Transesophageal echocardiogram March 2022 showed ejection fraction 45 to 50%, severe left atrial enlargement, mild right atrial enlargement, moderate mitral regurgitation, mild aortic insufficiency.  Patient subsequently had successful cardioversion to sinus rhythm.  She has had difficulties with compliance.  Since I last saw her,she describes dyspnea on exertion but no orthopnea, PND, pedal edema, chest pain or syncope.  Current Outpatient Medications  Medication Sig Dispense Refill  . acetaminophen (TYLENOL) 325 MG tablet Take 2 tablets (650 mg total) by mouth every 4 (four) hours as needed for headache or mild pain.    Marland Kitchen amiodarone (PACERONE) 200 MG tablet Take 1 tablet (200 mg total) by mouth daily. 90 tablet 3  . apixaban (ELIQUIS) 2.5 MG TABS tablet Take 1 tablet (2.5 mg total) by mouth 2 (two) times daily. 60 tablet 6  . Biotin 1000 MCG tablet Take 1,000 mcg by mouth daily.    . Cholecalciferol 25 MCG (1000 UT) capsule Take 1,000 Units by mouth daily.    . clonazePAM (KLONOPIN) 0.5 MG tablet Take 1 tablet (0.5 mg total) by mouth at bedtime as needed (leg cramps). TAKE 1 TABLET BY MOUTH AT BEDTIME AS NEEDED FOR LEG CRAMPS    . cyanocobalamin 1000 MCG tablet Take 1,000 mcg by mouth daily.    Marland Kitchen diltiazem (CARDIZEM CD) 120 MG 24 hr capsule Take 1 capsule (120 mg total) by mouth daily. 30 capsule 6  . docusate sodium (COLACE) 100 MG capsule Take 1 capsule (100 mg total) by mouth daily as needed for mild  constipation.    . Fluticasone-Umeclidin-Vilant (TRELEGY ELLIPTA) 100-62.5-25 MCG/INH AEPB Inhale 1 Inhaler into the lungs daily. 3 each 3  . hydrocerin (EUCERIN) CREA Apply 1 application topically 2 (two) times daily.  0  . isosorbide mononitrate (IMDUR) 30 MG 24 hr tablet Take 1 tablet (30 mg total) by mouth daily. 30 tablet 6  . ketoconazole (NIZORAL) 2 % cream APPLY TOPICALLY TO THE AFFECTED AREA DAILY (Patient taking differently: Apply topically daily as needed. As Directed) 45 g 1  . levalbuterol (XOPENEX HFA) 45 MCG/ACT inhaler Inhale 2 puffs into the lungs every 4 (four) hours as needed for wheezing or shortness of breath. 1 each 12  . levothyroxine (SYNTHROID) 25 MCG tablet Take 25 mcg by mouth every morning.    . megestrol (MEGACE) 400 MG/10ML suspension Take 5 mLs (200 mg total) by mouth 2 (two) times daily. 240 mL 1  . metoprolol tartrate (LOPRESSOR) 100 MG tablet Take 1 tablet (100 mg total) by mouth 2 (two) times daily. 60 tablet 6  . nitroGLYCERIN (NITROSTAT) 0.4 MG SL tablet Place 1 tablet (0.4 mg total) under the tongue every 5 (five) minutes as needed for chest pain. 25 tablet 0  . nystatin (MYCOSTATIN) 100000 UNIT/ML suspension Take 5 mLs (500,000 Units total) by mouth 4 (four) times daily. 60 mL 0  . pantoprazole (PROTONIX) 40 MG tablet Take 1 tablet (40 mg total) by mouth daily. 30 tablet 6  . potassium chloride (KLOR-CON) 10 MEQ tablet Take 1 tablet (10 mEq total) by mouth daily. 30 tablet  6  . Propylene Glycol (SYSTANE BALANCE) 0.6 % SOLN Place 1 drop into both eyes 2 (two) times daily as needed (dry eyes).    . rosuvastatin (CRESTOR) 10 MG tablet Take 1 tablet (10 mg total) by mouth daily. 90 tablet 3  . torsemide (DEMADEX) 20 MG tablet Take 2 tablets (40 mg total) by mouth 2 (two) times daily. 60 tablet 6   No current facility-administered medications for this visit.     Past Medical History:  Diagnosis Date  . Anxiety   . Arthritis   . Atrial fibrillation ([place])   .  B12 deficiency   . COPD (chronic obstructive pulmonary disease) ([place])   . Depression   . GERD (gastroesophageal reflux disease)   . Hypertension   . Hypoxia with need for home oxygen  03/14/2020  . [place] thrombus of left atrium 03/09/20 03/14/2020  . Osteoarthrosis, hand    both hands  . Peptic ulcer, unspecified site, unspecified as acute or chronic, without mention of hemorrhage, perforation, or obstruction   . Pneumonia   . PONV (postoperative nausea and vomiting)   . Vitamin D deficiency disease     Past Surgical History:  Procedure Laterality Date  . BLADDER SURGERY     Bladder tack and intestines  . CARDIOVERSION N/A 05/11/2020   Procedure: CARDIOVERSION;  Surgeon: Lelon Perla, MD;  Location[facility] ENDOSCOPY;  Service: Cardiovascular;  Laterality: N/A;  . CORONARY/GRAFT ACUTE MI REVASCULARIZATION N/A 08/26/2019   Procedure: Coronary/Graft Acute MI Revascularization;  Surgeon: Sherren Mocha, MD;  Location: [facility] LAB;  Service: Cardiovascular;  Laterality: N/A;  . CYSTOCELE REPAIR    . CYSTOSCOPY WITH RETROGRADE PYELOGRAM, URETEROSCOPY AND STENT PLACEMENT Right 09/11/2016   Procedure: CYSTOSCOPY WITH RETROGRADE PYELOGRAM, URETEROSCOPY AND STENT REPLACEMENT;  Surgeon: Cleon Gustin, MD;  Location: [facility] ORS;  Service: Urology;  Laterality: Right;  . CYSTOSCOPY WITH STENT PLACEMENT Right 08/11/2016   Procedure: CYSTOSCOPY, URETERSCOPY,  RIGHT RETROGRADE WITH RIGHT STENT PLACEMENT;  Surgeon: Festus Aloe, MD;  Location: [facility] ORS;  Service: Urology;  Laterality: Right;  . SPLENECTOMY    . TEE WITHOUT CARDIOVERSION N/A 03/10/2020   Procedure: TRANSESOPHAGEAL ECHOCARDIOGRAM (TEE);  Surgeon: Josue Hector, MD;  Location: [facility] ENDOSCOPY;  Service: Cardiovascular;  Laterality: N/A;  . TEE WITHOUT CARDIOVERSION N/A 05/11/2020   Procedure: TRANSESOPHAGEAL ECHOCARDIOGRAM (TEE);  Surgeon: Lelon Perla, MD;  Location: [facility] ENDOSCOPY;  Service: Cardiovascular;  Laterality: N/A;     Social History   Socioeconomic History  . Marital status: Divorced    Spouse name: Not on file  . Number of children: 2  . Years of education: Not on file  . Highest education level: Not on file  Occupational History  . Occupation: Airline pilot: [employer]  Tobacco Use  . Smoking status: Former Smoker    Packs/day: 1.00    Types: Cigarettes    Quit date: 06/12/08    Years since quitting: 12.3  . Smokeless tobacco: Never Used  . Tobacco comment: Pt unsure how many years she has been smoking   Vaping Use  . Vaping Use: Never used  Substance and Sexual Activity  . Alcohol use: No  . Drug use: No  . Sexual activity: Not Currently  Other Topics Concern  . Not on file  Social History Narrative   Retired, works part time   Divorced   Former Smoker   1- Son alcoholic abusive   Daughter died in 2006-06-13   Social Determinants of  Health   Financial Resource Strain: High Risk  . Difficulty of Paying Living Expenses: Very hard  Food Insecurity: Food Insecurity Present  . Worried About Charity fundraiser in the Last Year: Sometimes true  . Ran Out of Food in the Last Year: Sometimes true  Transportation Needs: No Transportation Needs  . Lack of Transportation (Medical): No  . Lack of Transportation (Non-Medical): No  Physical Activity: Unknown  . Days of Exercise per Week: Patient refused  . Minutes of Exercise per Session: Not on file  Stress: No Stress Concern Present  . Feeling of Stress : Not at all  Social Connections: Not on file  Intimate Partner Violence: Not on file    Family History  Problem Relation Age of Onset  . Heart disease Father   . Arthritis Other        Family history of  . Coronary artery disease Other        Family history of  . Hypertension Other        Family history of  . Diabetes Daughter        73 died    ROS: no fevers or chills, productive cough, hemoptysis, dysphasia, odynophagia, melena, hematochezia, dysuria, hematuria, rash,  seizure activity, orthopnea, PND, pedal edema, claudication. Remaining systems are negative.  Physical Exam: Well-developed well-nourished in no acute distress.  Skin is warm and dry.  HEENT is normal.  Neck is supple.  Chest is clear to auscultation with normal expansion.  Cardiovascular exam is regular rate and rhythm.  Abdominal exam nontender or distended. No masses palpated. Extremities show no edema. neuro grossly intact  ECG-Marked sinus bradycardia with occasional junctional escape beat, heart rate 35, first-degree AV block, nonspecific ST changes, cannot rule out septal infarct.  Personally reviewed  A/P  1 chronic diastolic congestive heart failure-she is not volume overloaded on examination.  Renal function also worse on recent laboratories.  Decrease Demadex to 20 mg twice daily.  She will return in 2 days and we will recheck potassium and renal function as well as BNP on that day.  2 paroxysmal atrial fibrillation-patient remains in sinus rhythm status post TEE guided cardioversion.  However she is bradycardic (electrocardiogram with rhythm strip shows sinus bradycardia with first-degree AV block, intermittent junctional escape beat).  We will continue amiodarone at 100 mg daily.  Discontinue Cardizem.  Hold metoprolol this evening and tomorrow morning and resume at 25 mg twice daily tomorrow evening.  Follow heart rate (I will see her back in 48 hours to make sure that she is stable).  She denies presyncope or syncope.  3 hypertension-blood pressure controlled.  However she is bradycardic and I am discontinuing Cardizem and decreasing metoprolol.  Follow blood pressure and adjust medications as needed.  We will need to watch for rebound hypertension given significant reduction in metoprolol dose.  4 coronary artery disease-continue statin.  She is not on aspirin given need for apixaban.  5 hyperlipidemia-continue statin.  6 chronic stage III kidney disease-creatinine  increased on recent laboratories.  We will recheck in 48 hours after decreasing diuretic.  Kirk Ruths, MD

## 2020-06-21 ENCOUNTER — Ambulatory Visit: Payer: Medicare Other | Admitting: Primary Care

## 2020-06-22 ENCOUNTER — Encounter: Payer: Self-pay | Admitting: Cardiology

## 2020-06-22 ENCOUNTER — Ambulatory Visit (INDEPENDENT_AMBULATORY_CARE_PROVIDER_SITE_OTHER): Payer: Medicare Other | Admitting: Cardiology

## 2020-06-22 ENCOUNTER — Other Ambulatory Visit: Payer: Self-pay

## 2020-06-22 VITALS — BP 114/68 | HR 39 | Ht 64.0 in | Wt 102.0 lb

## 2020-06-22 DIAGNOSIS — I480 Paroxysmal atrial fibrillation: Secondary | ICD-10-CM

## 2020-06-22 DIAGNOSIS — I10 Essential (primary) hypertension: Secondary | ICD-10-CM

## 2020-06-22 DIAGNOSIS — I5032 Chronic diastolic (congestive) heart failure: Secondary | ICD-10-CM | POA: Diagnosis not present

## 2020-06-22 DIAGNOSIS — I2510 Atherosclerotic heart disease of native coronary artery without angina pectoris: Secondary | ICD-10-CM

## 2020-06-22 DIAGNOSIS — E785 Hyperlipidemia, unspecified: Secondary | ICD-10-CM

## 2020-06-22 MED ORDER — TORSEMIDE 20 MG PO TABS: 20.0000 mg | ORAL_TABLET | Freq: Two times a day (BID) | ORAL | 6 refills | Status: DC

## 2020-06-22 MED ORDER — METOPROLOL TARTRATE 25 MG PO TABS: 25.0000 mg | ORAL_TABLET | Freq: Two times a day (BID) | ORAL | 3 refills | Status: DC

## 2020-06-22 NOTE — Patient Instructions (Signed)
Medication Instructions:   STOP DILTIAZEM  DO NOT TAKE METOPROLOL TONIGHT 4/19 AND DO NOT TAKE TOMORROW MORNING 4/20- RESTART METOPROLOL 25 MG TOMORROW EVENING 4/20 AND TAKE METOPROLOL 25 MG Thursday MORNING 4/21  *If you need a refill on your cardiac medications before your next appointment, please call your pharmacy*   Follow-Up: At [facility], you and your health needs are our priority.  As part of our continuing mission to provide you with exceptional heart care, we have created designated Provider Care Teams.  These Care Teams include your primary Cardiologist (physician) and Advanced Practice Providers (APPs -  Physician Assistants and Nurse Practitioners) who all work together to provide you with the care you need, when you need it.  We recommend signing up for the patient portal called "MyChart".  Sign up information is provided on this After Visit Summary.  MyChart is used to connect with patients for Virtual Visits (Telemedicine).  Patients are able to view lab/test results, encounter notes, upcoming appointments, etc.  Non-urgent messages can be sent to your provider as well.   To learn more about what you can do with MyChart, go to [email or web address].    Your next appointment:    Thursday 06/24/20 @ 12:20 PM

## 2020-06-23 ENCOUNTER — Telehealth: Payer: Self-pay | Admitting: Home Health

## 2020-06-23 ENCOUNTER — Emergency Department (HOSPITAL_COMMUNITY)
Admission: EM | Admit: 2020-06-23 | Discharge: 2020-06-24 | Disposition: A | Discharge status: Home / Self Care | Payer: Medicare Other | Attending: Emergency Medicine | Admitting: Emergency Medicine

## 2020-06-23 ENCOUNTER — Ambulatory Visit: Payer: Medicare Other | Admitting: Primary Care

## 2020-06-23 DIAGNOSIS — R030 Elevated blood-pressure reading, without diagnosis of hypertension: Secondary | ICD-10-CM | POA: Diagnosis present

## 2020-06-23 DIAGNOSIS — Z87891 Personal history of nicotine dependence: Secondary | ICD-10-CM | POA: Diagnosis not present

## 2020-06-23 DIAGNOSIS — Z79899 Other long term (current) drug therapy: Secondary | ICD-10-CM | POA: Insufficient documentation

## 2020-06-23 DIAGNOSIS — I130 Hypertensive heart and chronic kidney disease with heart failure and stage 1 through stage 4 chronic kidney disease, or unspecified chronic kidney disease: Secondary | ICD-10-CM | POA: Diagnosis not present

## 2020-06-23 DIAGNOSIS — I499 Cardiac arrhythmia, unspecified: Secondary | ICD-10-CM | POA: Diagnosis not present

## 2020-06-23 DIAGNOSIS — R6889 Other general symptoms and signs: Secondary | ICD-10-CM | POA: Diagnosis not present

## 2020-06-23 DIAGNOSIS — I2510 Atherosclerotic heart disease of native coronary artery without angina pectoris: Secondary | ICD-10-CM | POA: Diagnosis not present

## 2020-06-23 DIAGNOSIS — Z7951 Long term (current) use of inhaled steroids: Secondary | ICD-10-CM | POA: Insufficient documentation

## 2020-06-23 DIAGNOSIS — I5042 Chronic combined systolic (congestive) and diastolic (congestive) heart failure: Secondary | ICD-10-CM | POA: Diagnosis not present

## 2020-06-23 DIAGNOSIS — I10 Essential (primary) hypertension: Secondary | ICD-10-CM

## 2020-06-23 DIAGNOSIS — H579 Unspecified disorder of eye and adnexa: Secondary | ICD-10-CM | POA: Diagnosis not present

## 2020-06-23 DIAGNOSIS — E876 Hypokalemia: Secondary | ICD-10-CM | POA: Insufficient documentation

## 2020-06-23 DIAGNOSIS — Z7901 Long term (current) use of anticoagulants: Secondary | ICD-10-CM | POA: Diagnosis not present

## 2020-06-23 DIAGNOSIS — N289 Disorder of kidney and ureter, unspecified: Secondary | ICD-10-CM | POA: Insufficient documentation

## 2020-06-23 DIAGNOSIS — R079 Chest pain, unspecified: Secondary | ICD-10-CM | POA: Diagnosis not present

## 2020-06-23 DIAGNOSIS — Z743 Need for continuous supervision: Secondary | ICD-10-CM | POA: Diagnosis not present

## 2020-06-23 DIAGNOSIS — J449 Chronic obstructive pulmonary disease, unspecified: Secondary | ICD-10-CM | POA: Diagnosis not present

## 2020-06-23 DIAGNOSIS — N183 Chronic kidney disease, stage 3 unspecified: Secondary | ICD-10-CM | POA: Insufficient documentation

## 2020-06-23 DIAGNOSIS — I5041 Acute combined systolic (congestive) and diastolic (congestive) heart failure: Secondary | ICD-10-CM | POA: Insufficient documentation

## 2020-06-23 DIAGNOSIS — I160 Hypertensive urgency: Secondary | ICD-10-CM | POA: Diagnosis not present

## 2020-06-23 NOTE — Telephone Encounter (Addendum)
Patient called reporting BP elevated to 223/81, HR 79. She is nervous about her BP being so high. She states she is not clear on the instruction of her antihypertensive. She has stopped her Cardizem '120mg'$  and metoprolol '100mg'$  as instructed since yesterday's office visit, due to bradycardia at 30s. She was instructed to resume metoprolol '25mg'$  BID tonight but has not taken any metoprolol yet. She reports no longer taking Imdur '30mg'$  which remains on her medication list. She reports mild dizziness, denied any SOB, chest pain, headache, slurred speech. Advised the patient to take her scheduled metoprolol '25mg'$  now (as instructed by Dr Stanford Breed yesterday per note), re-check BP and HR in 1-2 hours. If BP remains elevated >200 and HR >60, Ok to take another dose of Metoprolol '25mg'$ . If BP remains persistently high and she develops chest pain, SOB, worsening dizziness , headache, call 911 for ER evaluation. Patient has an appointment tomorrow 06/24/20 with cardiology for re-evaluation of bradycardia after mediation change, instructed the patient to discuss medication concern and clarify if she should take Imdur with provider. She voiced understanding of above plan.  Called back to see if her repeat BP is improved, patient initially answered and then hang up. She did not answer for 2nd and 3rd call, voicemail left for the patient with instruction that if her BP remains elevated with above mentioned symptoms, may go to the nearest ER.

## 2020-06-23 NOTE — ED Triage Notes (Signed)
Pt arrived via ems due to hypertension. Pt has had a recent change in her home medications due to being sinus brady in the 40s. Pts cardiologist advised the pt stop her Diltiazem, do not take her metoprolol the night of 4/19. Pt instructed to restart her metoprolol 25 mg the evening of 4/20, and then took another 25 mg at 9:30 pm per cardiologist order. Pt has ahx of afib,cad. Pt reports some blurry vision which is not new. EMS reports last bp was 205/97.

## 2020-06-23 NOTE — Telephone Encounter (Addendum)
Patient called back to relay that she is currently feeling absolutely fine - no further dizziness. No CP, visual changes, nausea, headache, blurred vision, chest pain, weakness or SOB. She just retook blood pressure and it is still 123456 systolic. She checked her HR again and got 188. She rechecked it again and her device malfunctioned. It kept deflating and inflating and gives her an "E." Given her persistent HTN, I am concerned that low dose metoprolol will not bring it down effectively. We cannot press the dose much further either due to her recent bradycardia. I have recommended she proceed to the ER for further management. She absolutely refuses go to the emergency room under any circumstances or seek medical attention. She adamantly insists on taking a 2nd metoprolol dose as previously instructed and will follow up tomorrow as scheduled. Reiterated importance of seeking care if BP remains persistently elevated or if she develops sx but she is just not interested at this time.  Mohammedali Bedoy PA-C

## 2020-06-23 NOTE — ED Provider Notes (Signed)
[facility] EMERGENCY DEPARTMENT Provider Note   CSN: [id number] Arrival date & time: 06/23/20  2255   History Chief Complaint  Patient presents with  . Hypertension    [name] is a 85 y.o. female.  The history is provided by the patient.  Hypertension  She has history of hypertension, hyperlipidemia, coronary artery disease, diastolic heart failure, paroxysmal atrial fibrillation anticoagulated on apixaban and comes in because of elevated blood pressure.  She had seen her cardiologist yesterday who adjusted her medication because her heart rate was very slow and advised her to monitor blood pressure.  Blood pressure has been going up steadily during the day today and got as high as [id number] systolic.  She denies any headache, tinnitus, epistaxis, chest pain, heaviness, tightness, pressure.  Past Medical History:  Diagnosis Date  . Anxiety   . Arthritis   . Atrial fibrillation ([place])   . B12 deficiency   . COPD (chronic obstructive pulmonary disease) ([place])   . Depression   . GERD (gastroesophageal reflux disease)   . Hypertension   . Hypoxia with need for home oxygen  03/14/2020  . Mural thrombus of left atrium 03/09/20 03/14/2020  . Osteoarthrosis, hand    both hands  . Peptic ulcer, unspecified site, unspecified as acute or chronic, without mention of hemorrhage, perforation, or obstruction   . Pneumonia   . PONV (postoperative nausea and vomiting)   . Vitamin D deficiency disease     Patient Active Problem List   Diagnosis Date Noted  . Vision changes 06/14/2020  . Mural thrombus of left atrium 03/09/20 03/14/2020  . Hypoxia with need for home oxygen  03/14/2020  . Acute combined systolic and diastolic heart failure ([place]) 03/08/2020  . Stomatitis 01/21/2020  . Edema 12/03/2019  . AKI (acute kidney injury) ([place])   . Acute diastolic CHF (congestive heart failure) ([place])   . Hypokalemia 09/15/2019  . CKD (chronic kidney disease), stage III ([place]) 09/15/2019   . Elevated troponin 09/15/2019  . Hypertensive emergency 09/15/2019  . Dyslipidemia, goal LDL below 70 08/27/2019  . Acute ST elevation myocardial infarction (STEMI) of lateral wall ([place]) 08/26/2019  . Heart attack ([place]) 08/26/2019  . Ankle pain, left 07/23/2019  . Intertrigo 02/05/2019  . Thrush 01/10/2019  . Shingles 09/16/2018  . CAD S/P percutaneous coronary angioplasty 08/2019 05/06/2018  . Esophageal dysmotility 01/11/2018  . Weight gain 01/01/2018  . Abdominal bloating 01/01/2018  . Hyperkalemia 06/04/2017  . Grief at loss of child 04/02/2017  . Adenopathy, cervical 11/29/2016  . Dysuria 10/31/2016  . Vertigo 10/31/2016  . Ureteral calculus 09/11/2016  . CAP (community acquired pneumonia) 08/30/2016  . Fatigue 08/24/2016  . Sensation of pressure in bladder area 08/24/2016  . Kidney stone 08/13/2016  . Flank pain 08/10/2016  . Right upper quadrant abdominal tenderness without rebound tenderness 08/10/2016  . Leukocytosis 08/10/2016  . Acute abdomen 08/10/2016  . Intractable nausea and vomiting 08/10/2016  . ARF (acute renal failure) ([place]) 08/10/2016  . Urolithiasis 08/10/2016  . Abdominal pain 08/10/2016  . Anemia 05/19/2016  . Generalized anxiety disorder 01/14/2016  . Dysphagia 04/16/2015  . Dry skin dermatitis 12/28/2014  . Atrophic vaginitis 05/26/2014  . Varicose veins of both lower extremities 12/12/2013  . Restless leg syndrome 03/17/2013  . Impacted cerumen of left ear 03/17/2013  . Osteoarthritis, hand 09/05/2011  . Epistaxis, recurrent 08/17/2011  . Well adult exam 01/31/2011  . Low back pain 01/31/2011  . Palpitations 05/24/2010  . Cerumen impaction  02/23/2010  . PHARYNGITIS, ACUTE 02/23/2010  . SINUSITIS, ACUTE 01/07/2010  . SOB (shortness of breath) 09/23/2009  . BRONCHITIS, ACUTE 08/03/2009  . Osteoarthritis 07/06/2009  . TOBACCO USE, QUIT 03/08/2009  . ALLERGIC RHINITIS 11/06/2008  . NAUSEA ALONE 08/19/2008  . DIARRHEA 08/19/2008  . Benign  essential HTN 07/15/2008  . EMPHYSEMA 07/15/2008  . Peptic ulcer 07/15/2008  . ARTHRITIS 07/15/2008  . HEADACHE 05/25/2008  . SYNCOPE 03/23/2008  . WEIGHT LOSS, ABNORMAL 03/23/2008  . HEARING IMPAIRMENT 12/23/2007  . B12 deficiency 10/14/2007  . Malaise 10/14/2007  . INSOMNIA, PERSISTENT 07/31/2007  . Anxiety state 03/01/2007  . DEPRESSION 03/01/2007  . Persistent atrial fibrillation ([place]) 02/26/2007  . Vitamin D deficiency 02/19/2007  . GERD 02/19/2007  . COPD with chronic bronchitis ([place]) 08/07/2006    Past Surgical History:  Procedure Laterality Date  . BLADDER SURGERY     Bladder tack and intestines  . CARDIOVERSION N/A 05/11/2020   Procedure: CARDIOVERSION;  Surgeon: Lelon Perla, MD;  Location: [facility] ENDOSCOPY;  Service: Cardiovascular;  Laterality: N/A;  . CORONARY/GRAFT ACUTE MI REVASCULARIZATION N/A 08/26/2019   Procedure: Coronary/Graft Acute MI Revascularization;  Surgeon: Sherren Mocha, MD;  Location: [facility] CV LAB;  Service: Cardiovascular;  Laterality: N/A;  . CYSTOCELE REPAIR    . CYSTOSCOPY WITH RETROGRADE PYELOGRAM, URETEROSCOPY AND STENT PLACEMENT Right 09/11/2016   Procedure: CYSTOSCOPY WITH RETROGRADE PYELOGRAM, URETEROSCOPY AND STENT REPLACEMENT;  Surgeon: Cleon Gustin, MD;  Location: [facility] ORS;  Service: Urology;  Laterality: Right;  . CYSTOSCOPY WITH STENT PLACEMENT Right 08/11/2016   Procedure: CYSTOSCOPY, URETERSCOPY,  RIGHT RETROGRADE WITH RIGHT STENT PLACEMENT;  Surgeon: Festus Aloe, MD;  Location: [facility] ORS;  Service: Urology;  Laterality: Right;  . SPLENECTOMY    . TEE WITHOUT CARDIOVERSION N/A 03/10/2020   Procedure: TRANSESOPHAGEAL ECHOCARDIOGRAM (TEE);  Surgeon: Josue Hector, MD;  Location: [facility] ENDOSCOPY;  Service: Cardiovascular;  Laterality: N/A;  . TEE WITHOUT CARDIOVERSION N/A 05/11/2020   Procedure: TRANSESOPHAGEAL ECHOCARDIOGRAM (TEE);  Surgeon: Lelon Perla, MD;  Location: [facility] ENDOSCOPY;  Service: Cardiovascular;  Laterality: N/A;      OB History   No obstetric history on file.     Family History  Problem Relation Age of Onset  . Heart disease Father   . Arthritis Other        Family history of  . Coronary artery disease Other        Family history of  . Hypertension Other        Family history of  . Diabetes Daughter        76 died    Social History   Tobacco Use  . Smoking status: Former Smoker    Packs/day: 1.00    Types: Cigarettes    Quit date: 2010    Years since quitting: 12.3  . Smokeless tobacco: Never Used  . Tobacco comment: Pt unsure how many years she has been smoking   Vaping Use  . Vaping Use: Never used  Substance Use Topics  . Alcohol use: No  . Drug use: No    Home Medications Prior to Admission medications   Medication Sig Start Date End Date Taking? Authorizing Provider  acetaminophen (TYLENOL) 325 MG tablet Take 2 tablets (650 mg total) by mouth every 4 (four) hours as needed for headache or mild pain. 03/14/20   Isaiah Serge, NP  amiodarone (PACERONE) 200 MG tablet Take 1 tablet (200 mg total) by mouth daily. Patient taking differently: Take 100 mg by  mouth daily. 04/27/20   Almyra Deforest, PA  apixaban (ELIQUIS) 2.5 MG TABS tablet Take 1 tablet (2.5 mg total) by mouth 2 (two) times daily. 03/25/20   Lelon Perla, MD  Biotin 1000 MCG tablet Take 1,000 mcg by mouth daily.    [provider]  Cholecalciferol 25 MCG (1000 UT) capsule Take 1,000 Units by mouth daily.    [provider]  clonazePAM (KLONOPIN) 0.5 MG tablet Take 1 tablet (0.5 mg total) by mouth at bedtime as needed (leg cramps). TAKE 1 TABLET BY MOUTH AT BEDTIME AS NEEDED FOR LEG CRAMPS 03/14/20   Isaiah Serge, NP  cyanocobalamin 1000 MCG tablet Take 1,000 mcg by mouth daily.    [provider]  docusate sodium (COLACE) 100 MG capsule Take 1 capsule (100 mg total) by mouth daily as needed for mild constipation. 03/14/20   Isaiah Serge, NP  Fluticasone-Umeclidin-Vilant (TRELEGY  ELLIPTA) 100-62.5-25 MCG/INH AEPB Inhale 1 Inhaler into the lungs daily. 12/03/19   Plotnikov, Evie Lacks, MD  hydrocerin (EUCERIN) CREA Apply 1 application topically 2 (two) times daily. 03/14/20   Isaiah Serge, NP  isosorbide mononitrate (IMDUR) 30 MG 24 hr tablet Take 1 tablet (30 mg total) by mouth daily. 03/25/20   Lelon Perla, MD  ketoconazole (NIZORAL) 2 % cream APPLY TOPICALLY TO THE AFFECTED AREA DAILY Patient taking differently: Apply topically daily as needed. As Directed 08/11/19   Plotnikov, Evie Lacks, MD  levalbuterol [facility] HFA) 45 MCG/ACT inhaler Inhale 2 puffs into the lungs every 4 (four) hours as needed for wheezing or shortness of breath. 04/16/20   Margaretha Seeds, MD  levothyroxine (SYNTHROID) 25 MCG tablet Take 25 mcg by mouth every morning. 03/30/20   [provider]  megestrol (MEGACE) 400 MG/10ML suspension Take 5 mLs (200 mg total) by mouth 2 (two) times daily. 06/14/20 07/14/20  Plotnikov, Evie Lacks, MD  metoprolol tartrate (LOPRESSOR) 25 MG tablet Take 1 tablet (25 mg total) by mouth 2 (two) times daily. 06/22/20 09/20/20  Lelon Perla, MD  nitroGLYCERIN (NITROSTAT) 0.4 MG SL tablet Place 1 tablet (0.4 mg total) under the tongue every 5 (five) minutes as needed for chest pain. 08/28/19 08/27/20  Kathyrn Drown D, NP  nystatin (MYCOSTATIN) 100000 UNIT/ML suspension Take 5 mLs (500,000 Units total) by mouth 4 (four) times daily. 03/15/20   Hoyt Koch, MD  pantoprazole (PROTONIX) 40 MG tablet Take 1 tablet (40 mg total) by mouth daily. 03/25/20   Lelon Perla, MD  potassium chloride (KLOR-CON) 10 MEQ tablet Take 1 tablet (10 mEq total) by mouth daily. 03/25/20   Lelon Perla, MD  Propylene Glycol (SYSTANE BALANCE) 0.6 % SOLN Place 1 drop into both eyes 2 (two) times daily as needed (dry eyes).    [provider]  rosuvastatin (CRESTOR) 10 MG tablet Take 1 tablet (10 mg total) by mouth daily. 03/25/20   Lelon Perla, MD  torsemide  (DEMADEX) 20 MG tablet Take 1 tablet (20 mg total) by mouth 2 (two) times daily. 06/22/20   Lelon Perla, MD    Allergies    Amoxicillin, Codeine, Levaquin [levofloxacin in d5w], and Other  Review of Systems   Review of Systems  All other systems reviewed and are negative.   Physical Exam Updated Vital Signs BP (!) 188/103   Pulse 66   Temp 97.6 F (36.4 C) (Oral)   Resp 18   Ht '5\' 4"'$  (1.626 m)   Wt 44 kg  SpO2 96%   BMI 16.65 kg/m   Physical Exam Vitals and nursing note reviewed.   85 year old female, resting comfortably and in no acute distress. Vital signs are significant for elevated blood pressure. Oxygen saturation is 96%, which is normal. Head is normocephalic and atraumatic. PERRLA, EOMI. Oropharynx is clear. Neck is nontender and supple without adenopathy or JVD. Back is nontender and there is no CVA tenderness. Lungs are clear without rales, wheezes, or rhonchi. Chest is nontender. Heart has regular rate and rhythm without murmur. Abdomen is soft, flat, nontender without masses or hepatosplenomegaly and peristalsis is normoactive. Extremities have no cyanosis or edema, full range of motion is present. Skin is warm and dry without rash. Neurologic: Mental status is normal, cranial nerves are intact, there are no motor or sensory deficits.  ED Results / Procedures / Treatments   Labs (all labs ordered are listed, but only abnormal results are displayed) Labs Reviewed  BASIC METABOLIC PANEL - Abnormal; Notable for the following components:      Result Value   Potassium 2.8 (*)    Chloride 92 (*)    CO2 36 (*)    BUN 29 (*)    Creatinine, Ser 1.98 (*)    GFR, Estimated 25 (*)    All other components within normal limits  CBC WITH DIFFERENTIAL/PLATELET - Abnormal; Notable for the following components:   RDW 22.4 (*)    Monocytes Absolute 1.5 (*)    All other components within normal limits  MAGNESIUM    EKG EKG  Interpretation  Date/Time:  Wednesday June 23 2020 23:11:37 EDT Ventricular Rate:  68 PR Interval:  51 QRS Duration: 227 QT Interval:  512 QTC Calculation: 553 R Axis:   130 Text Interpretation: Sinus rhythm Atrial premature complex Short PR interval Consider left ventricular hypertrophy Prolonged QT interval Artifact Baseline wander When compared with ECG of 05/11/2020, QT has lengthened Premature atrial complexes are now present Confirmed by Delora Fuel ([id number]) on 06/24/2020 12:03:45 AM  Procedures Procedures  CRITICAL CARE Performed by: Delora Fuel Total critical care time: 50 minutes Critical care time was exclusive of separately billable procedures and treating other patients. Critical care was necessary to treat or prevent imminent or life-threatening deterioration. Critical care was time spent personally by me on the following activities: development of treatment plan with patient and/or surrogate as well as nursing, discussions with consultants, evaluation of patient's response to treatment, examination of patient, obtaining history from patient or surrogate, ordering and performing treatments and interventions, ordering and review of laboratory studies, ordering and review of radiographic studies, pulse oximetry and re-evaluation of patient's condition.  Medications Ordered in ED Medications - No data to display  ED Course  I have reviewed the triage vital signs and the nursing notes.  Pertinent labs & imaging results that were available during my care of the patient were reviewed by me and considered in my medical decision making (see chart for details).  MDM Rules/Calculators/A&P Elevated blood pressure.  Blood pressure in the ED is high, but not critically high.  Old records are reviewed confirming cardiology office visit yesterday at which time she had adjustments made to her blood pressure medications because of a heart rate of 39 and blood pressure 114/68.  She was supposed  to discontinue diltiazem and stop metoprolol today and then resume tomorrow at a lower dose.  Heart rate today is adequate.  She will be observed in the ED and if blood pressure does not get too  high, I feel she probably would be able to see her cardiologist tomorrow, which had already been scheduled.  12:39 AM Blood pressure has increased to 218/85.  She is given a dose of hydralazine.  Blood pressure has come down slightly with hydralazine.  She is given a second dose of hydralazine and it is staying consistently under 200 but still high.  Labs have come back showing severe hypokalemia with potassium 2.8.  She is given oral and intravenous potassium.  Renal insufficiency is present not significantly changed from baseline.  Will check magnesium level.  Correction of hypokalemia and possible hypomagnesemia may also help with blood pressure control.  Cardiology has been consulted and will come and evaluate the patient in the ED.  Case is signed out to Dr. Rogene Houston.  Final Clinical Impression(s) / ED Diagnoses Final diagnoses:  Elevated blood pressure reading with diagnosis of hypertension  Hypokalemia  Renal insufficiency    Rx / DC Orders ED Discharge Orders    None       Delora Fuel, MD [id number] [phone]

## 2020-06-23 NOTE — ED Notes (Signed)
Md Roxanne Mins aware of pts bp of 210/81

## 2020-06-24 ENCOUNTER — Encounter (HOSPITAL_COMMUNITY): Payer: Self-pay | Admitting: Cardiology

## 2020-06-24 ENCOUNTER — Ambulatory Visit: Payer: Medicare Other | Admitting: Cardiology

## 2020-06-24 ENCOUNTER — Other Ambulatory Visit: Payer: Self-pay

## 2020-06-24 ENCOUNTER — Other Ambulatory Visit: Payer: Self-pay | Admitting: Physician Assistant

## 2020-06-24 ENCOUNTER — Telehealth: Payer: Self-pay | Admitting: Cardiology

## 2020-06-24 DIAGNOSIS — I160 Hypertensive urgency: Secondary | ICD-10-CM

## 2020-06-24 DIAGNOSIS — E876 Hypokalemia: Secondary | ICD-10-CM

## 2020-06-24 DIAGNOSIS — N1832 Chronic kidney disease, stage 3b: Secondary | ICD-10-CM

## 2020-06-24 DIAGNOSIS — I5042 Chronic combined systolic (congestive) and diastolic (congestive) heart failure: Secondary | ICD-10-CM

## 2020-06-24 LAB — BASIC METABOLIC PANEL
Anion gap: 10 (ref 5–15)
BUN: 29 mg/dL — ABNORMAL HIGH (ref 8–23)
CO2: 36 mmol/L — ABNORMAL HIGH (ref 22–32)
Calcium: 9.1 mg/dL (ref 8.9–10.3)
Chloride: 92 mmol/L — ABNORMAL LOW (ref 98–111)
Creatinine, Ser: 1.98 mg/dL — ABNORMAL HIGH (ref 0.44–1.00)
GFR, Estimated: 25 mL/min — ABNORMAL LOW (ref >60)
Glucose, Bld: 82 mg/dL (ref 70–99)
Potassium: 2.8 mmol/L — ABNORMAL LOW (ref 3.5–5.1)
Sodium: 138 mmol/L (ref 135–145)

## 2020-06-24 LAB — CBC WITH DIFFERENTIAL/PLATELET
Abs Immature Granulocytes: 0.05 10*3/uL (ref 0.00–0.07)
Basophils Absolute: 0 10*3/uL (ref 0.0–0.1)
Basophils Relative: 0 %
Eosinophils Absolute: 0.1 10*3/uL (ref 0.0–0.5)
Eosinophils Relative: 1 %
HCT: 39 % (ref 36.0–46.0)
Hemoglobin: 12.5 g/dL (ref 12.0–15.0)
Immature Granulocytes: 1 %
Lymphocytes Relative: 16 %
Lymphs Abs: 1.6 10*3/uL (ref 0.7–4.0)
MCH: 26.7 pg (ref 26.0–34.0)
MCHC: 32.1 g/dL (ref 30.0–36.0)
MCV: 83.3 fL (ref 80.0–100.0)
Monocytes Absolute: 1.5 10*3/uL — ABNORMAL HIGH (ref 0.1–1.0)
Monocytes Relative: 15 %
Neutro Abs: 6.7 10*3/uL (ref 1.7–7.7)
Neutrophils Relative %: 67 %
Platelets: 294 10*3/uL (ref 150–400)
RBC: 4.68 MIL/uL (ref 3.87–5.11)
RDW: 22.4 % — ABNORMAL HIGH (ref 11.5–15.5)
WBC: 10 10*3/uL (ref 4.0–10.5)
nRBC: 0 % (ref 0.0–0.2)

## 2020-06-24 MED ORDER — POTASSIUM CHLORIDE CRYS ER 20 MEQ PO TBCR
40.0000 meq | EXTENDED_RELEASE_TABLET | Freq: Once | ORAL | Status: AC
  Administered 2020-06-24: 40 meq via ORAL
  Filled 2020-06-24: qty 2

## 2020-06-24 MED ORDER — HYDRALAZINE HCL 25 MG PO TABS
25.0000 mg | ORAL_TABLET | Freq: Once | ORAL | Status: AC
  Administered 2020-06-24: 25 mg via ORAL
  Filled 2020-06-24: qty 1

## 2020-06-24 MED ORDER — POTASSIUM CHLORIDE 10 MEQ/100ML IV SOLN
10.0000 meq | INTRAVENOUS | Status: AC
  Administered 2020-06-24 (×2): 10 meq via INTRAVENOUS
  Filled 2020-06-24 (×2): qty 100

## 2020-06-24 MED ORDER — HYDRALAZINE HCL 20 MG/ML IJ SOLN
10.0000 mg | Freq: Once | INTRAMUSCULAR | Status: AC
  Administered 2020-06-24: 10 mg via INTRAVENOUS
  Filled 2020-06-24: qty 1

## 2020-06-24 NOTE — Telephone Encounter (Signed)
    [name] called, she said pt was brought to [facility] ED due to her low potassium she cancelled pt's appt today and wanted for Dr. Stanford Breed to know

## 2020-06-24 NOTE — Telephone Encounter (Signed)
This RN routed to primary nurse.

## 2020-06-24 NOTE — ED Notes (Signed)
Pt stated "it is getting hard to breathe because my blood pressure is so high". RN was notified. Pt BP 204/81 and SPO2 93% on the monitor.

## 2020-06-24 NOTE — ED Notes (Signed)
Pt assisted to bedside commode

## 2020-06-24 NOTE — Progress Notes (Deleted)
HPI: FU CAD and paroxysmal atrial fibrillation. Patient had acute lateral myocardial infarction June 2021. Cardiac catheterization revealed an occluded first diagonal and 30% mid LAD. Patient had balloon angioplasty due to small vessel size. Follow-up echocardiogram July 2021 showed ejection fraction 50 to 55%, mild left atrial enlargement and mild aortic insufficiency.Patient noted to be in recurrent atrial fibrillation December 2021. Patient was symptomatic with worsening CHF symptoms. Transesophageal echocardiogram March 2022 showed ejection fraction 45 to 50%, severe left atrial enlargement, mild right atrial enlargement, moderate mitral regurgitation, mild aortic insufficiency.  Patient subsequently had successful cardioversion to sinus rhythm.  She has had difficulties with compliance. At last ov patient noted to be bradycardic with heart rate in the 30s.  Cardizem was discontinued and metoprolol decreased.  Patient subsequently developed hypertension presented to the emergency room.  Initial blood pressure 188/103.  Potassium was noted to be 2.8.  Heart rate improved.  Patient given hydralazine and potassium supplemented.  Since I last saw her,  No current facility-administered medications for this visit.   Current Outpatient Medications  Medication Sig Dispense Refill  . acetaminophen (TYLENOL) 325 MG tablet Take 2 tablets (650 mg total) by mouth every 4 (four) hours as needed for headache or mild pain.    Marland Kitchen amiodarone (PACERONE) 200 MG tablet Take 1 tablet (200 mg total) by mouth daily. (Patient taking differently: Take 100 mg by mouth daily.) 90 tablet 3  . apixaban (ELIQUIS) 2.5 MG TABS tablet Take 1 tablet (2.5 mg total) by mouth 2 (two) times daily. 60 tablet 6  . Biotin 1000 MCG tablet Take 1,000 mcg by mouth daily.    . Cholecalciferol 25 MCG (1000 UT) capsule Take 1,000 Units by mouth daily.    . clonazePAM (KLONOPIN) 0.5 MG tablet Take 1 tablet (0.5 mg total) by mouth at  bedtime as needed (leg cramps). TAKE 1 TABLET BY MOUTH AT BEDTIME AS NEEDED FOR LEG CRAMPS    . cyanocobalamin 1000 MCG tablet Take 1,000 mcg by mouth daily.    Marland Kitchen docusate sodium (COLACE) 100 MG capsule Take 1 capsule (100 mg total) by mouth daily as needed for mild constipation.    . Fluticasone-Umeclidin-Vilant (TRELEGY ELLIPTA) 100-62.5-25 MCG/INH AEPB Inhale 1 Inhaler into the lungs daily. 3 each 3  . hydrocerin (EUCERIN) CREA Apply 1 application topically 2 (two) times daily.  0  . isosorbide mononitrate (IMDUR) 30 MG 24 hr tablet Take 1 tablet (30 mg total) by mouth daily. 30 tablet 6  . ketoconazole (NIZORAL) 2 % cream APPLY TOPICALLY TO THE AFFECTED AREA DAILY (Patient taking differently: Apply topically daily as needed. As Directed) 45 g 1  . levalbuterol (XOPENEX HFA) 45 MCG/ACT inhaler Inhale 2 puffs into the lungs every 4 (four) hours as needed for wheezing or shortness of breath. 1 each 12  . levothyroxine (SYNTHROID) 25 MCG tablet Take 25 mcg by mouth every morning.    . megestrol (MEGACE) 400 MG/10ML suspension Take 5 mLs (200 mg total) by mouth 2 (two) times daily. 240 mL 1  . metoprolol tartrate (LOPRESSOR) 25 MG tablet Take 1 tablet (25 mg total) by mouth 2 (two) times daily. 180 tablet 3  . nitroGLYCERIN (NITROSTAT) 0.4 MG SL tablet Place 1 tablet (0.4 mg total) under the tongue every 5 (five) minutes as needed for chest pain. 25 tablet 0  . nystatin (MYCOSTATIN) 100000 UNIT/ML suspension Take 5 mLs (500,000 Units total) by mouth 4 (four) times daily. 60 mL 0  . pantoprazole (  PROTONIX) 40 MG tablet Take 1 tablet (40 mg total) by mouth daily. 30 tablet 6  . potassium chloride (KLOR-CON) 10 MEQ tablet Take 1 tablet (10 mEq total) by mouth daily. 30 tablet 6  . Propylene Glycol (SYSTANE BALANCE) 0.6 % SOLN Place 1 drop into both eyes 2 (two) times daily as needed (dry eyes).    . rosuvastatin (CRESTOR) 10 MG tablet Take 1 tablet (10 mg total) by mouth daily. 90 tablet 3  . torsemide  (DEMADEX) 20 MG tablet Take 1 tablet (20 mg total) by mouth 2 (two) times daily. 60 tablet 6   Facility-Administered Medications Ordered in Other Visits  Medication Dose Route Frequency Provider Last Rate Last Admin  . potassium chloride 10 mEq in 100 mL IVPB  10 mEq Intravenous Q1 Hr x 2 Delora Fuel, MD [id number] mL/hr at 06/24/20 0635 10 mEq at 06/24/20 [id number]     Past Medical History:  Diagnosis Date  . Anxiety   . Arthritis   . Atrial fibrillation ([place])   . B12 deficiency   . COPD (chronic obstructive pulmonary disease) ([place])   . Depression   . GERD (gastroesophageal reflux disease)   . Hypertension   . Hypoxia with need for home oxygen  03/14/2020  . [place] thrombus of left atrium 03/09/20 03/14/2020  . Osteoarthrosis, hand    both hands  . Peptic ulcer, unspecified site, unspecified as acute or chronic, without mention of hemorrhage, perforation, or obstruction   . Pneumonia   . PONV (postoperative nausea and vomiting)   . Vitamin D deficiency disease     Past Surgical History:  Procedure Laterality Date  . BLADDER SURGERY     Bladder tack and intestines  . CARDIOVERSION N/A 05/11/2020   Procedure: CARDIOVERSION;  Surgeon: Lelon Perla, MD;  Location: [facility] ENDOSCOPY;  Service: Cardiovascular;  Laterality: N/A;  . CORONARY/GRAFT ACUTE MI REVASCULARIZATION N/A 08/26/2019   Procedure: Coronary/Graft Acute MI Revascularization;  Surgeon: Sherren Mocha, MD;  Location: [facility] CV LAB;  Service: Cardiovascular;  Laterality: N/A;  . CYSTOCELE REPAIR    . CYSTOSCOPY WITH RETROGRADE PYELOGRAM, URETEROSCOPY AND STENT PLACEMENT Right 09/11/2016   Procedure: CYSTOSCOPY WITH RETROGRADE PYELOGRAM, URETEROSCOPY AND STENT REPLACEMENT;  Surgeon: Cleon Gustin, MD;  Location: [facility] ORS;  Service: Urology;  Laterality: Right;  . CYSTOSCOPY WITH STENT PLACEMENT Right 08/11/2016   Procedure: CYSTOSCOPY, URETERSCOPY,  RIGHT RETROGRADE WITH RIGHT STENT PLACEMENT;  Surgeon: Festus Aloe, MD;   Location: [facility] ORS;  Service: Urology;  Laterality: Right;  . SPLENECTOMY    . TEE WITHOUT CARDIOVERSION N/A 03/10/2020   Procedure: TRANSESOPHAGEAL ECHOCARDIOGRAM (TEE);  Surgeon: Josue Hector, MD;  Location: [facility] ENDOSCOPY;  Service: Cardiovascular;  Laterality: N/A;  . TEE WITHOUT CARDIOVERSION N/A 05/11/2020   Procedure: TRANSESOPHAGEAL ECHOCARDIOGRAM (TEE);  Surgeon: Lelon Perla, MD;  Location: [facility] ENDOSCOPY;  Service: Cardiovascular;  Laterality: N/A;    Social History   Socioeconomic History  . Marital status: Divorced    Spouse name: Not on file  . Number of children: 2  . Years of education: Not on file  . Highest education level: Not on file  Occupational History  . Occupation: Airline pilot: [employer]  Tobacco Use  . Smoking status: Former Smoker    Packs/day: 1.00    Types: Cigarettes    Quit date: 2010    Years since quitting: 12.3  . Smokeless tobacco: Never Used  . Tobacco comment: Pt unsure how many years she has been  smoking   Vaping Use  . Vaping Use: Never used  Substance and Sexual Activity  . Alcohol use: No  . Drug use: No  . Sexual activity: Not Currently  Other Topics Concern  . Not on file  Social History Narrative   Retired, works part time   Divorced   Former Smoker   1- Son alcoholic abusive   Daughter died in 06-10-06   Social Determinants of Health   Financial Resource Strain: High Risk  . Difficulty of Paying Living Expenses: Very hard  Food Insecurity: Food Insecurity Present  . Worried About Charity fundraiser in the Last Year: Sometimes true  . Ran Out of Food in the Last Year: Sometimes true  Transportation Needs: No Transportation Needs  . Lack of Transportation (Medical): No  . Lack of Transportation (Non-Medical): No  Physical Activity: Unknown  . Days of Exercise per Week: Patient refused  . Minutes of Exercise per Session: Not on file  Stress: No Stress Concern Present  . Feeling of Stress : Not at all  Social  Connections: Not on file  Intimate Partner Violence: Not on file    Family History  Problem Relation Age of Onset  . Heart disease Father   . Arthritis Other        Family history of  . Coronary artery disease Other        Family history of  . Hypertension Other        Family history of  . Diabetes Daughter        50 died    ROS: no fevers or chills, productive cough, hemoptysis, dysphasia, odynophagia, melena, hematochezia, dysuria, hematuria, rash, seizure activity, orthopnea, PND, pedal edema, claudication. Remaining systems are negative.  Physical Exam: Well-developed well-nourished in no acute distress.  Skin is warm and dry.  HEENT is normal.  Neck is supple.  Chest is clear to auscultation with normal expansion.  Cardiovascular exam is regular rate and rhythm.  Abdominal exam nontender or distended. No masses palpated. Extremities show no edema. neuro grossly intact  ECG-July 01, 2020-sinus rhythm with PACs, heart rate 68, prolonged QT interval.  Personally reviewed  A/P  1  Kirk Ruths, MD

## 2020-06-24 NOTE — Discharge Instructions (Addendum)
Follow-up with cardiology tomorrow morning as per Dr. Percival Spanish.  He is submitted prescriptions for your new medications.  Take them as directed.

## 2020-06-24 NOTE — ED Provider Notes (Signed)
Patient seen after some time by cardiology and they were extremely helpful.  We were unable to repeat potation's potassium or get the magnesium.  They decided not to pursue that any further.  Patient given additional oral potassium here given IV hydralazine again.  But did not make much change in her blood pressure.  Dr. Orlean Patten and will see them in the cardiology clinic tomorrow has made arrangements for that.  He is also put in new prescriptions for her.   Fredia Sorrow, MD 06/24/20 1534

## 2020-06-24 NOTE — ED Notes (Signed)
Pt states she is having a hard time breathing. Lung sounds diminished but clear. Pt states she wears O2 at home all the time. Pt SpO2 92%. Placed on 2 liters for comfort. Pt at 96% 2 liters [place]

## 2020-06-24 NOTE — Consult Note (Signed)
Cardiology Consultation:   Patient ID: [name] MRN: [id number]; DOB: 06/08/1935  Admit date: 06/23/2020 Date of Consult: 06/24/2020  PCP:  Cassandria Anger, MD   [place]  Cardiologist:  Kirk Ruths, MD   Patient Profile:   [name] is a 85 y.o. 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 Rogene Houston.  History of Present Illness:   [name] has a history of STEMI 08/26/19 treated with balloon angioplasty alone to D1 due to small vessel size and poor distal runoff. No disease in LM, LCx, LAD, or RCA. Unclear if this was a plaque rupture or embolic event. Pt stated she did not want to take plavix at follow up. She was noted to be in recurrent Afib Dec 2021. On 03/08/20, pt was symptomatic with worsening CHF symptoms felt related to Afib RVR.  Echo 03/09/20 showed EF 35-40%, mild LVH, moderately reduced RV function, and mild to moderate LAE. She was agreeable to start 2.5 mg eliquis BID with plans for TEE-guided DCCV. TEE on 03/10/20 found LAA thrombus and DCCV was aborted. She remained SOB and diuretic was switched to 40 mg torsemide BID. She was seen by Almyra Deforest [facility] 04/27/20 with continued complaints of dyspnea. She was treated for PNA in Jan and has COPD. She refused TEE-DCCV at that time due to needing a COVID swab. She called our office and rescheduled. Repeat TEE March 2022 with right atrial enlargement, moderate MR, mild AI, and resolution of thrombus. She underwent DCCV 05/11/20 to sinus rhythm. She was last seen in clinic by Dr. Stanford Breed 06/22/20. Due to worsening renal function. demadex was reduced to 20 mg BID for HFpEF. She was instructed to repeat labs in 2 days. She was bradycardic with first degree AV block with intermittent junctional escape beat. Amiodarone 100 mg BID  continued, but cardizem was discontinued and Metoprolol was reduced to 25 mg BID. Dr. Stanford Breed noted that rebound hypertension could occur due to prolonged BB that was reduced in dose.   She called our after-hours call line on 06/23/20 stating her BP was elevated and HR 188. She was instructed to go to the ER, but adamantly refused. She instead took 50 mg lopressor.  She ultimately presented to ER for HTN.  Upon lab draw yesterday, K was 2.8.  She was sinus bradycardic in the 40s.  BP with EMS was 205/97. ON presentation she was 188/103 - 218/85. Cardiology was consulted for further management.   K 2.8 (4.4) sCr trending down 1.98 (2.14 on 06/14/20) HR 60-70s  During my interview, she reports no symptoms except continued dyspnea on exertion. She is adamant about discharging from the ER today. BP still 0000000 systolic. She has received 25 mg hydralazine x 2 doses. She has received 10 mEq IV K x 3 + 40 mEq PO potassium. She denies headache, dizziness, near syncope, palpitations and chest pain. No lower extremity swelling. Main complaint currently is being NPO.    Past Medical History:  Diagnosis Date  . Anxiety   . Arthritis   . Atrial fibrillation ([place])   . B12 deficiency   . COPD (chronic obstructive pulmonary disease) ([place])   . Depression   . GERD (gastroesophageal reflux disease)   . Hypertension   . Hypoxia with need  for home oxygen  03/14/2020  . [place] thrombus of left atrium 03/09/20 03/14/2020  . Osteoarthrosis, hand    both hands  . Peptic ulcer, unspecified site, unspecified as acute or chronic, without mention of hemorrhage, perforation, or obstruction   . Pneumonia   . PONV (postoperative nausea and vomiting)   . Vitamin D deficiency disease     Past Surgical History:  Procedure Laterality Date  . BLADDER SURGERY     Bladder tack and intestines  . CARDIOVERSION N/A 05/11/2020   Procedure: CARDIOVERSION;  Surgeon: Lelon Perla, MD;  Location[facility] ENDOSCOPY;  Service:  Cardiovascular;  Laterality: N/A;  . CORONARY/GRAFT ACUTE MI REVASCULARIZATION N/A 08/26/2019   Procedure: Coronary/Graft Acute MI Revascularization;  Surgeon: Sherren Mocha, MD;  Location: [place] CV LAB;  Service: Cardiovascular;  Laterality: N/A;  . CYSTOCELE REPAIR    . CYSTOSCOPY WITH RETROGRADE PYELOGRAM, URETEROSCOPY AND STENT PLACEMENT Right 09/11/2016   Procedure: CYSTOSCOPY WITH RETROGRADE PYELOGRAM, URETEROSCOPY AND STENT REPLACEMENT;  Surgeon: Cleon Gustin, MD;  Location: [facility] ORS;  Service: Urology;  Laterality: Right;  . CYSTOSCOPY WITH STENT PLACEMENT Right 08/11/2016   Procedure: CYSTOSCOPY, URETERSCOPY,  RIGHT RETROGRADE WITH RIGHT STENT PLACEMENT;  Surgeon: Festus Aloe, MD;  Location: [facility] ORS;  Service: Urology;  Laterality: Right;  . SPLENECTOMY    . TEE WITHOUT CARDIOVERSION N/A 03/10/2020   Procedure: TRANSESOPHAGEAL ECHOCARDIOGRAM (TEE);  Surgeon: Josue Hector, MD;  Location: [facility] ENDOSCOPY;  Service: Cardiovascular;  Laterality: N/A;  . TEE WITHOUT CARDIOVERSION N/A 05/11/2020   Procedure: TRANSESOPHAGEAL ECHOCARDIOGRAM (TEE);  Surgeon: Lelon Perla, MD;  Location: [facility] ENDOSCOPY;  Service: Cardiovascular;  Laterality: N/A;     Home Medications:  Prior to Admission medications   Medication Sig Start Date End Date Taking? Authorizing Provider  amiodarone (PACERONE) 200 MG tablet Take 1 tablet (200 mg total) by mouth daily. Patient taking differently: Take 100 mg by mouth 2 (two) times daily. 04/27/20  Yes Almyra Deforest, PA  apixaban (ELIQUIS) 2.5 MG TABS tablet Take 1 tablet (2.5 mg total) by mouth 2 (two) times daily. 03/25/20  Yes Lelon Perla, MD  Cholecalciferol 25 MCG (1000 UT) capsule Take 1,000 Units by mouth daily.   Yes [provider]  clonazePAM (KLONOPIN) 0.5 MG tablet Take 1 tablet (0.5 mg total) by mouth at bedtime as needed (leg cramps). TAKE 1 TABLET BY MOUTH AT BEDTIME AS NEEDED FOR LEG CRAMPS 03/14/20  Yes Isaiah Serge, NP  docusate  sodium (COLACE) 100 MG capsule Take 1 capsule (100 mg total) by mouth daily as needed for mild constipation. 03/14/20  Yes Isaiah Serge, NP  Fluticasone-Umeclidin-Vilant (TRELEGY ELLIPTA) 100-62.5-25 MCG/INH AEPB Inhale 1 Inhaler into the lungs daily. 12/03/19  Yes Plotnikov, Evie Lacks, MD  levalbuterol [facility] HFA) 45 MCG/ACT inhaler Inhale 2 puffs into the lungs every 4 (four) hours as needed for wheezing or shortness of breath. 04/16/20  Yes Margaretha Seeds, MD  levothyroxine (SYNTHROID) 25 MCG tablet Take 25 mcg by mouth every morning. 03/30/20  Yes [provider]  megestrol (MEGACE) 400 MG/10ML suspension Take 5 mLs (200 mg total) by mouth 2 (two) times daily. 06/14/20 07/14/20 Yes Plotnikov, Evie Lacks, MD  metoprolol tartrate (LOPRESSOR) 25 MG tablet Take 1 tablet (25 mg total) by mouth 2 (two) times daily. 06/22/20 09/20/20 Yes Crenshaw, Denice Bors, MD  nitroGLYCERIN (NITROSTAT) 0.4 MG SL tablet Place 1 tablet (0.4 mg total) under the tongue every 5 (five) minutes as needed for chest pain.  08/28/19 08/27/20 Yes Kathyrn Drown D, NP  pantoprazole (PROTONIX) 40 MG tablet Take 1 tablet (40 mg total) by mouth daily. 03/25/20  Yes Lelon Perla, MD  potassium chloride (KLOR-CON) 10 MEQ tablet Take 1 tablet (10 mEq total) by mouth daily. Patient taking differently: Take 10 mEq by mouth 2 (two) times daily. 03/25/20  Yes Lelon Perla, MD  Propylene Glycol (SYSTANE BALANCE) 0.6 % SOLN Place 1 drop into both eyes 2 (two) times daily as needed (dry eyes).   Yes [provider]  rosuvastatin (CRESTOR) 10 MG tablet Take 1 tablet (10 mg total) by mouth daily. 03/25/20  Yes Lelon Perla, MD  torsemide (DEMADEX) 20 MG tablet Take 1 tablet (20 mg total) by mouth 2 (two) times daily. Patient taking differently: Take 40 mg by mouth 2 (two) times daily. 06/22/20  Yes Lelon Perla, MD  acetaminophen (TYLENOL) 325 MG tablet Take 2 tablets (650 mg total) by mouth every 4 (four) hours as  needed for headache or mild pain. Patient not taking: Reported on 06/24/2020 03/14/20   Isaiah Serge, NP  Biotin 1000 MCG tablet Take 1,000 mcg by mouth daily. Patient not taking: Reported on 06/24/2020    [provider]  cyanocobalamin 1000 MCG tablet Take 1,000 mcg by mouth daily. Patient not taking: Reported on 06/24/2020    [provider]  hydrocerin (EUCERIN) CREA Apply 1 application topically 2 (two) times daily. Patient not taking: Reported on 06/24/2020 03/14/20   Isaiah Serge, NP  isosorbide mononitrate (IMDUR) 30 MG 24 hr tablet Take 1 tablet (30 mg total) by mouth daily. Patient not taking: Reported on 06/24/2020 03/25/20   Lelon Perla, MD  ketoconazole (NIZORAL) 2 % cream APPLY TOPICALLY TO THE AFFECTED AREA DAILY Patient not taking: Reported on 06/24/2020 08/11/19   Plotnikov, Evie Lacks, MD  nystatin (MYCOSTATIN) 100000 UNIT/ML suspension Take 5 mLs (500,000 Units total) by mouth 4 (four) times daily. Patient not taking: Reported on 06/24/2020 03/15/20   Hoyt Koch, MD    Inpatient Medications: Scheduled Meds:  Continuous Infusions:  PRN Meds:   Allergies:    Allergies  Allergen Reactions  . Amoxicillin Other (See Comments)    "makes me crazy" Has patient had a PCN reaction causing immediate rash, facial/tongue/throat swelling, SOB or lightheadedness with hypotension: No Has patient had a PCN reaction causing severe rash involving mucus membranes or skin necrosis: No Has patient had a PCN reaction that required hospitalization: No Has patient had a PCN reaction occurring within the last 10 years: No If all of the above answers are "NO", then may proceed with Cephalosporin use.   . Codeine Nausea And Vomiting  . Levaquin [Levofloxacin In D5w] Nausea And Vomiting  . Other Nausea And Vomiting    Pt does not want to take any pain meds    Social History:   Social History   Socioeconomic History  . Marital status: Divorced    Spouse  name: Not on file  . Number of children: 2  . Years of education: Not on file  . Highest education level: Not on file  Occupational History  . Occupation: Airline pilot: [employer]  Tobacco Use  . Smoking status: Former Smoker    Packs/day: 1.00    Types: Cigarettes    Quit date: 2010    Years since quitting: 12.3  . Smokeless tobacco: Never Used  . Tobacco comment: Pt unsure how many years she has been smoking  Vaping Use  . Vaping Use: Never used  Substance and Sexual Activity  . Alcohol use: No  . Drug use: No  . Sexual activity: Not Currently  Other Topics Concern  . Not on file  Social History Narrative   Retired, works part time   Divorced   Former Smoker   1- Son alcoholic abusive   Daughter died in June 16, 2006   Social Determinants of Health   Financial Resource Strain: High Risk  . Difficulty of Paying Living Expenses: Very hard  Food Insecurity: Food Insecurity Present  . Worried About Charity fundraiser in the Last Year: Sometimes true  . Ran Out of Food in the Last Year: Sometimes true  Transportation Needs: No Transportation Needs  . Lack of Transportation (Medical): No  . Lack of Transportation (Non-Medical): No  Physical Activity: Unknown  . Days of Exercise per Week: Patient refused  . Minutes of Exercise per Session: Not on file  Stress: No Stress Concern Present  . Feeling of Stress : Not at all  Social Connections: Not on file  Intimate Partner Violence: Not on file    Family History:    Family History  Problem Relation Age of Onset  . Heart disease Father   . Arthritis Other        Family history of  . Coronary artery disease Other        Family history of  . Hypertension Other        Family history of  . Diabetes Daughter        98 died     ROS:  Please see the history of present illness.   All other ROS reviewed and negative.     Physical Exam/Data:   Vitals:   06/24/20 1041 06/24/20 1100 06/24/20 1130 06/24/20 1145  BP: (!)  151/90 (!) 202/76 (!) 189/72 (!) 208/75  Pulse: 65 74 68 76  Resp: '19 19 19 19  '$ Temp:      TempSrc:      SpO2: 96% 93% 94% 95%  Weight:      Height:        Intake/Output Summary (Last 24 hours) at 06/24/2020 1201 Last data filed at 06/24/2020 0955 Gross per 24 hour  Intake 244.9 ml  Output --  Net 244.9 ml   Last 3 Weights 06/23/2020 06/22/2020 06/14/2020  Weight (lbs) 97 lb 102 lb 98 lb 9.6 oz  Weight (kg) 43.999 kg 46.267 kg 44.725 kg     Body mass index is 16.65 kg/m.  General:  Elderly thin female in NAD HEENT: normal Neck: no JVD Vascular: No carotid bruits  Cardiac:  normal S1, S2; RRR; no murmur Lungs:  clear to auscultation bilaterally, no wheezing, rhonchi or rales  Abd: soft, nontender, no hepatomegaly  Ext: minimal B LE edema, left leg tender Musculoskeletal:  No deformities, BUE and BLE strength normal and equal Skin: warm and dry  Neuro:  CNs 2-12 intact, no focal abnormalities noted Psych:  Normal affect   EKG:  The EKG was personally reviewed and demonstrates:  Sinus rhythm with HR 68, PACs, prior septal infarct (old) Telemetry:  Telemetry was personally reviewed and demonstrates:  Sinus rhythm in the 60-70s, telemetry alarming Afib but I believe this is sinus with PACs - difficult due to artifact  Relevant CV Studies:  TEE 05/11/20: 1. Left ventricular ejection fraction, by estimation, is 45 to 50%. The  left ventricle has mildly decreased function.  2. Right ventricular systolic function is  normal. The right ventricular  size is normal.  3. Left atrial size was severely dilated. No left atrial/left atrial  appendage thrombus was detected.  4. Right atrial size was mildly dilated.  5. The mitral valve is normal in structure. Moderate mitral valve  regurgitation.  6. The aortic valve is tricuspid. Aortic valve regurgitation is mild.  7. There is mild (Grade II) plaque involving the descending aorta.   Laboratory Data:  High Sensitivity Troponin:   No results for input(s): TROPONINIHS in the last 720 hours.   Chemistry Recent Labs  Lab 06/23/20 2308  NA 138  K 2.8*  CL 92*  CO2 36*  GLUCOSE 82  BUN 29*  CREATININE 1.98*  CALCIUM 9.1  GFRNONAA 25*  ANIONGAP 10    No results for input(s): PROT, ALBUMIN, AST, ALT, ALKPHOS, BILITOT in the last 168 hours. Hematology Recent Labs  Lab 06/23/20 2308  WBC 10.0  RBC 4.68  HGB 12.5  HCT 39.0  MCV 83.3  MCH 26.7  MCHC 32.1  RDW 22.4*  PLT 294   BNPNo results for input(s): BNP, PROBNP in the last 168 hours.  DDimer No results for input(s): DDIMER in the last 168 hours.   Radiology/Studies:  No results found.   Assessment and Plan:   Hypertensive urgency - pt recently stopped cardizem and reduced dose of metoprolol - appears to have rebound hypertension - she reports that a home health agency threw away her imdur and she does not think she has been taking this - will add 30 mg imdur and 50 mg hydralazine TID   Hypokalemia - check K following supplementation by EDP - prefer this is > 4.0 - Mg > 2.0   Chronic systolic and diastolic heart failure - recent TEE with preserved 45-50% - was 35-40% in Jan 2022 with RVR - GDMT difficult given renal function - imdur and hydralazine as above - she also reports that she is taking 40 mg torsemide BID - given renal function, will reduce this to 40 qAm and 20 q PM with plans for further reduction in OP clinic setting - improvement in EF encouraging   PAF s/p DCCV March 2022 Bradycardia in clinic 06/22/20 First degree heart block - remains in sinus rhythm, bradycardia has largely resolved with discontinuation of carrdizem - continue 25 mg lopressor  - continue 100 mg amiodarone BID - continue 2.5 mg eliquis BID - this is the appropriate dose      Risk Assessment/Risk Scores:     CHA2DS2-VASc Score = 6  This indicates a 9.7% annual risk of stroke. The patient's score is based upon: CHF History: Yes HTN History:  Yes Diabetes History: No Stroke History: No Vascular Disease History: Yes Age Score: 2 Gender Score: 1       For questions or updates, please contact [place] Please consult [email or web address] for contact info under    Signed, Ledora Bottcher, PA  06/24/2020 12:01 PM  History and all data above reviewed.  Patient examined.  I agree with the findings as above.   The patient presented because of increased BP.  She has had recent changes to her PO meds as above.  Her meds were discontinued because of symptomatic bradycardia.  I confirmed that she was symptomatic with her lower HR.  She lives alone and really does not report any symptoms associated with this increased BP.  She is found to be hypokalemic in the ED.  (She has had total 60 meq KCL but does  not have enough IV access for more IV.)  She has been given hydralazine 25 mg PO twice since admission but her SBP is remaining elevated at 200.  She absolutely wants to go home.  The patient exam reveals COR: RRR  ,  Lungs: Clear  ,  Abd: Positive bowel sounds, no rebound no guarding, Ext No edema  .  All available labs, radiology testing, previous records reviewed. Agree with documented assessment and plan. HYPERTENSIVE URGENCY:  She does not want to be admitted and I believe that the agitation over being in the ED for so long his contributing to her hypertension.  I have asked the ED to give 10 mg IV hydralazine and the patient agrees to stay for an hour or so to monitor.   We would then add hydralazine 50 mg tid to her home meds and she can be instructed to take an extra 1/2 tablet q8h if the BP remains above 160.  We would then make further adjustments based on the readings at home.   We have added Imdur.  She might need Norvasc but we can do that as an outpatient.  HYPOKALEMIA:  We cannot give more IV potassium so she will get another 40 meq PO crushed.  We need to arrange a BMET tomorrow in the office.   She needs to be on at least 20 meq of  KCL daily at home which is double her previous dose.    [name]  1:13 PM  06/24/2020

## 2020-06-24 NOTE — Social Work (Signed)
CSW coordinated transportation for Pt via [employer]

## 2020-06-25 ENCOUNTER — Encounter (HOSPITAL_COMMUNITY): Payer: Self-pay

## 2020-06-25 ENCOUNTER — Telehealth: Payer: Self-pay | Admitting: Cardiology

## 2020-06-25 ENCOUNTER — Emergency Department (HOSPITAL_COMMUNITY)
Admission: EM | Admit: 2020-06-25 | Discharge: 2020-06-26 | Disposition: A | Discharge status: Home / Self Care | Payer: Medicare Other | Attending: Emergency Medicine | Admitting: Emergency Medicine

## 2020-06-25 ENCOUNTER — Other Ambulatory Visit: Payer: Self-pay

## 2020-06-25 ENCOUNTER — Other Ambulatory Visit: Payer: Self-pay | Admitting: *Deleted

## 2020-06-25 DIAGNOSIS — I5041 Acute combined systolic (congestive) and diastolic (congestive) heart failure: Secondary | ICD-10-CM | POA: Diagnosis not present

## 2020-06-25 DIAGNOSIS — N289 Disorder of kidney and ureter, unspecified: Secondary | ICD-10-CM

## 2020-06-25 DIAGNOSIS — I4819 Other persistent atrial fibrillation: Secondary | ICD-10-CM | POA: Diagnosis not present

## 2020-06-25 DIAGNOSIS — I130 Hypertensive heart and chronic kidney disease with heart failure and stage 1 through stage 4 chronic kidney disease, or unspecified chronic kidney disease: Secondary | ICD-10-CM | POA: Diagnosis not present

## 2020-06-25 DIAGNOSIS — Z87891 Personal history of nicotine dependence: Secondary | ICD-10-CM | POA: Insufficient documentation

## 2020-06-25 DIAGNOSIS — J449 Chronic obstructive pulmonary disease, unspecified: Secondary | ICD-10-CM | POA: Insufficient documentation

## 2020-06-25 DIAGNOSIS — H579 Unspecified disorder of eye and adnexa: Secondary | ICD-10-CM | POA: Diagnosis not present

## 2020-06-25 DIAGNOSIS — R6889 Other general symptoms and signs: Secondary | ICD-10-CM | POA: Diagnosis not present

## 2020-06-25 DIAGNOSIS — Z7901 Long term (current) use of anticoagulants: Secondary | ICD-10-CM | POA: Diagnosis not present

## 2020-06-25 DIAGNOSIS — Z7952 Long term (current) use of systemic steroids: Secondary | ICD-10-CM | POA: Diagnosis not present

## 2020-06-25 DIAGNOSIS — I2510 Atherosclerotic heart disease of native coronary artery without angina pectoris: Secondary | ICD-10-CM | POA: Insufficient documentation

## 2020-06-25 DIAGNOSIS — I440 Atrioventricular block, first degree: Secondary | ICD-10-CM | POA: Diagnosis not present

## 2020-06-25 DIAGNOSIS — N183 Chronic kidney disease, stage 3 unspecified: Secondary | ICD-10-CM | POA: Insufficient documentation

## 2020-06-25 DIAGNOSIS — Z79899 Other long term (current) drug therapy: Secondary | ICD-10-CM | POA: Insufficient documentation

## 2020-06-25 DIAGNOSIS — Z743 Need for continuous supervision: Secondary | ICD-10-CM | POA: Diagnosis not present

## 2020-06-25 DIAGNOSIS — R29810 Facial weakness: Secondary | ICD-10-CM | POA: Diagnosis not present

## 2020-06-25 DIAGNOSIS — I499 Cardiac arrhythmia, unspecified: Secondary | ICD-10-CM | POA: Diagnosis not present

## 2020-06-25 DIAGNOSIS — I10 Essential (primary) hypertension: Secondary | ICD-10-CM

## 2020-06-25 DIAGNOSIS — R0602 Shortness of breath: Secondary | ICD-10-CM | POA: Diagnosis not present

## 2020-06-25 MED ORDER — ISOSORBIDE MONONITRATE ER 30 MG PO TB24: 30.0000 mg | ORAL_TABLET | Freq: Every day | ORAL | 6 refills | Status: AC

## 2020-06-25 MED ORDER — TORSEMIDE 20 MG PO TABS
ORAL_TABLET | ORAL | 3 refills | Status: DC
Start: 2020-06-25 — End: 2020-06-25

## 2020-06-25 MED ORDER — TORSEMIDE 20 MG PO TABS: ORAL_TABLET | ORAL | 3 refills | Status: AC

## 2020-06-25 MED ORDER — HYDRALAZINE HCL 50 MG PO TABS: 50.0000 mg | ORAL_TABLET | Freq: Three times a day (TID) | ORAL | 3 refills | Status: DC

## 2020-06-25 NOTE — ED Triage Notes (Signed)
Patient BIB [facility] from home for hypertension, was seen on 4/20 for same, has been having blurry vision in R eye, denies chest pain and shortness of breath.

## 2020-06-25 NOTE — ED Provider Notes (Signed)
MSE was initiated and I personally evaluated the patient and placed orders (if any) at  11:45 PM on June 25, 2020.  Patient returns to ED for high blood pressure. Has been put on new prescriptions by cardiology, seen yesterday by cards in the office (and seen in the ED on consult 4/20). Today, her right eye became blurry. H/O macular degeneration that causes left eye blindness but no visual disturbance in the right until today. Also feels more SOB than usual with h/o COPD. On 3 L prn.   Pleasant 85 yo BP 178/116 here  The patient appears stable so that the remainder of the MSE may be completed by another provider.   Charlann Lange, PA-C 06/25/20 2348    Palumbo, April, MD 06/26/20 [phone]

## 2020-06-25 NOTE — Addendum Note (Signed)
Addended by: Ricci Barker on: 06/25/2020 06:12 PM   Modules accepted: Orders

## 2020-06-25 NOTE — Telephone Encounter (Signed)
I called to check on the patients BP readings and to make sure that she is getting her labs drawn.  There was no answer and I tried to call her sister as well.  No contact.  I will try again.  Left MTCB.

## 2020-06-25 NOTE — Telephone Encounter (Signed)
Spoke with pt, she is confussed about her medications and is unable to see good enough to figure them out. Will go to the patient home to help.

## 2020-06-25 NOTE — Telephone Encounter (Signed)
PT is calling back to speak with Dr.Crenshaw's nurse.Please Advise

## 2020-06-25 NOTE — Progress Notes (Signed)
Medications sent in per hospital discharge note (Dr. Percival Spanish)

## 2020-06-26 ENCOUNTER — Observation Stay (HOSPITAL_COMMUNITY)
Admission: EM | Admit: 2020-06-26 | Discharge: 2020-06-28 | Disposition: A | Discharge status: Home / Self Care | Payer: Medicare Other | Attending: Internal Medicine | Admitting: Internal Medicine

## 2020-06-26 ENCOUNTER — Other Ambulatory Visit: Payer: Self-pay

## 2020-06-26 ENCOUNTER — Emergency Department (HOSPITAL_COMMUNITY): Payer: Medicare Other

## 2020-06-26 ENCOUNTER — Encounter (HOSPITAL_COMMUNITY): Payer: Self-pay

## 2020-06-26 DIAGNOSIS — I517 Cardiomegaly: Secondary | ICD-10-CM | POA: Diagnosis not present

## 2020-06-26 DIAGNOSIS — Z7901 Long term (current) use of anticoagulants: Secondary | ICD-10-CM | POA: Insufficient documentation

## 2020-06-26 DIAGNOSIS — Z87891 Personal history of nicotine dependence: Secondary | ICD-10-CM | POA: Diagnosis not present

## 2020-06-26 DIAGNOSIS — S0003XA Contusion of scalp, initial encounter: Secondary | ICD-10-CM | POA: Diagnosis not present

## 2020-06-26 DIAGNOSIS — S0990XA Unspecified injury of head, initial encounter: Secondary | ICD-10-CM | POA: Diagnosis not present

## 2020-06-26 DIAGNOSIS — I2510 Atherosclerotic heart disease of native coronary artery without angina pectoris: Secondary | ICD-10-CM | POA: Diagnosis not present

## 2020-06-26 DIAGNOSIS — N1832 Chronic kidney disease, stage 3b: Secondary | ICD-10-CM | POA: Diagnosis not present

## 2020-06-26 DIAGNOSIS — S0181XA Laceration without foreign body of other part of head, initial encounter: Principal | ICD-10-CM | POA: Insufficient documentation

## 2020-06-26 DIAGNOSIS — S060X9A Concussion with loss of consciousness of unspecified duration, initial encounter: Secondary | ICD-10-CM

## 2020-06-26 DIAGNOSIS — Z79899 Other long term (current) drug therapy: Secondary | ICD-10-CM | POA: Insufficient documentation

## 2020-06-26 DIAGNOSIS — R110 Nausea: Secondary | ICD-10-CM | POA: Diagnosis not present

## 2020-06-26 DIAGNOSIS — S0993XA Unspecified injury of face, initial encounter: Secondary | ICD-10-CM | POA: Diagnosis not present

## 2020-06-26 DIAGNOSIS — J449 Chronic obstructive pulmonary disease, unspecified: Secondary | ICD-10-CM | POA: Diagnosis not present

## 2020-06-26 DIAGNOSIS — M4312 Spondylolisthesis, cervical region: Secondary | ICD-10-CM | POA: Diagnosis not present

## 2020-06-26 DIAGNOSIS — I130 Hypertensive heart and chronic kidney disease with heart failure and stage 1 through stage 4 chronic kidney disease, or unspecified chronic kidney disease: Secondary | ICD-10-CM | POA: Insufficient documentation

## 2020-06-26 DIAGNOSIS — R42 Dizziness and giddiness: Secondary | ICD-10-CM

## 2020-06-26 DIAGNOSIS — N183 Chronic kidney disease, stage 3 unspecified: Secondary | ICD-10-CM | POA: Insufficient documentation

## 2020-06-26 DIAGNOSIS — S0083XA Contusion of other part of head, initial encounter: Secondary | ICD-10-CM

## 2020-06-26 DIAGNOSIS — Y92009 Unspecified place in unspecified non-institutional (private) residence as the place of occurrence of the external cause: Secondary | ICD-10-CM | POA: Diagnosis not present

## 2020-06-26 DIAGNOSIS — I10 Essential (primary) hypertension: Secondary | ICD-10-CM | POA: Diagnosis present

## 2020-06-26 DIAGNOSIS — Z20822 Contact with and (suspected) exposure to covid-19: Secondary | ICD-10-CM | POA: Insufficient documentation

## 2020-06-26 DIAGNOSIS — K219 Gastro-esophageal reflux disease without esophagitis: Secondary | ICD-10-CM

## 2020-06-26 DIAGNOSIS — I513 Intracardiac thrombosis, not elsewhere classified: Secondary | ICD-10-CM | POA: Diagnosis present

## 2020-06-26 DIAGNOSIS — W19XXXA Unspecified fall, initial encounter: Secondary | ICD-10-CM | POA: Insufficient documentation

## 2020-06-26 DIAGNOSIS — E039 Hypothyroidism, unspecified: Secondary | ICD-10-CM | POA: Diagnosis not present

## 2020-06-26 DIAGNOSIS — J984 Other disorders of lung: Secondary | ICD-10-CM | POA: Diagnosis not present

## 2020-06-26 DIAGNOSIS — J439 Emphysema, unspecified: Secondary | ICD-10-CM | POA: Diagnosis not present

## 2020-06-26 DIAGNOSIS — R55 Syncope and collapse: Secondary | ICD-10-CM | POA: Insufficient documentation

## 2020-06-26 DIAGNOSIS — S01111A Laceration without foreign body of right eyelid and periocular area, initial encounter: Secondary | ICD-10-CM | POA: Diagnosis not present

## 2020-06-26 DIAGNOSIS — E785 Hyperlipidemia, unspecified: Secondary | ICD-10-CM | POA: Diagnosis present

## 2020-06-26 DIAGNOSIS — S199XXA Unspecified injury of neck, initial encounter: Secondary | ICD-10-CM | POA: Diagnosis not present

## 2020-06-26 DIAGNOSIS — I4819 Other persistent atrial fibrillation: Secondary | ICD-10-CM | POA: Diagnosis not present

## 2020-06-26 DIAGNOSIS — I5042 Chronic combined systolic (congestive) and diastolic (congestive) heart failure: Secondary | ICD-10-CM | POA: Diagnosis not present

## 2020-06-26 DIAGNOSIS — Z9861 Coronary angioplasty status: Secondary | ICD-10-CM

## 2020-06-26 DIAGNOSIS — S060XAA Concussion with loss of consciousness status unknown, initial encounter: Secondary | ICD-10-CM

## 2020-06-26 LAB — BASIC METABOLIC PANEL
Anion gap: 11 (ref 5–15)
BUN/Creatinine Ratio: 13 (ref 12–28)
BUN: 18 mg/dL (ref 8–27)
BUN: 28 mg/dL — ABNORMAL HIGH (ref 8–23)
CO2: 22 mmol/L (ref 20–29)
CO2: 28 mmol/L (ref 22–32)
Calcium: 9.3 mg/dL (ref 8.7–10.3)
Calcium: 9.5 mg/dL (ref 8.9–10.3)
Chloride: 100 mmol/L (ref 96–106)
Chloride: 93 mmol/L — ABNORMAL LOW (ref 98–111)
Creatinine, Ser: 1.44 mg/dL — ABNORMAL HIGH (ref 0.57–1.00)
Creatinine, Ser: 1.96 mg/dL — ABNORMAL HIGH (ref 0.44–1.00)
GFR, Estimated: 25 mL/min — ABNORMAL LOW (ref >60)
Glucose, Bld: 91 mg/dL (ref 70–99)
Glucose: 103 mg/dL — ABNORMAL HIGH (ref 65–99)
Potassium: 4.1 mmol/L (ref 3.5–5.1)
Potassium: 4.5 mmol/L (ref 3.5–5.2)
Sodium: 132 mmol/L — ABNORMAL LOW (ref 135–145)
Sodium: 139 mmol/L (ref 134–144)
eGFR: 36 mL/min/{1.73_m2} — ABNORMAL LOW (ref >59)

## 2020-06-26 LAB — CBC WITH DIFFERENTIAL/PLATELET
Abs Immature Granulocytes: 0.03 10*3/uL (ref 0.00–0.07)
Abs Immature Granulocytes: 0.1 10*3/uL — ABNORMAL HIGH (ref 0.00–0.07)
Basophils Absolute: 0 10*3/uL (ref 0.0–0.1)
Basophils Absolute: 0 10*3/uL (ref 0.0–0.1)
Basophils Relative: 0 %
Basophils Relative: 0 %
Eosinophils Absolute: 0.1 10*3/uL (ref 0.0–0.5)
Eosinophils Absolute: 0 10*3/uL (ref 0.0–0.5)
Eosinophils Relative: 1 %
Eosinophils Relative: 0 %
HCT: 38 % (ref 36.0–46.0)
HCT: 39 % (ref 36.0–46.0)
Hemoglobin: 12.3 g/dL (ref 12.0–15.0)
Hemoglobin: 12.5 g/dL (ref 12.0–15.0)
Immature Granulocytes: 0 %
Immature Granulocytes: 1 %
Lymphocytes Relative: 12 %
Lymphocytes Relative: 5 %
Lymphs Abs: 1.4 10*3/uL (ref 0.7–4.0)
Lymphs Abs: 0.7 10*3/uL (ref 0.7–4.0)
MCH: 26.9 pg (ref 26.0–34.0)
MCH: 26.7 pg (ref 26.0–34.0)
MCHC: 32.4 g/dL (ref 30.0–36.0)
MCHC: 32.1 g/dL (ref 30.0–36.0)
MCV: 83 fL (ref 80.0–100.0)
MCV: 83.3 fL (ref 80.0–100.0)
Monocytes Absolute: 1.5 10*3/uL — ABNORMAL HIGH (ref 0.1–1.0)
Monocytes Absolute: 1.3 10*3/uL — ABNORMAL HIGH (ref 0.1–1.0)
Monocytes Relative: 13 %
Monocytes Relative: 9 %
Neutro Abs: 7.9 10*3/uL — ABNORMAL HIGH (ref 1.7–7.7)
Neutro Abs: 13.2 10*3/uL — ABNORMAL HIGH (ref 1.7–7.7)
Neutrophils Relative %: 74 %
Neutrophils Relative %: 85 %
Platelets: 325 10*3/uL (ref 150–400)
Platelets: 273 10*3/uL (ref 150–400)
RBC: 4.58 MIL/uL (ref 3.87–5.11)
RBC: 4.68 MIL/uL (ref 3.87–5.11)
RDW: 22.6 % — ABNORMAL HIGH (ref 11.5–15.5)
RDW: 23.2 % — ABNORMAL HIGH (ref 11.5–15.5)
WBC: 10.9 10*3/uL — ABNORMAL HIGH (ref 4.0–10.5)
WBC: 15.4 10*3/uL — ABNORMAL HIGH (ref 4.0–10.5)
nRBC: 0 % (ref 0.0–0.2)
nRBC: 0 % (ref 0.0–0.2)

## 2020-06-26 LAB — COMPREHENSIVE METABOLIC PANEL
ALT: 28 U/L (ref 0–44)
AST: 71 U/L — ABNORMAL HIGH (ref 15–41)
Albumin: 3.2 g/dL — ABNORMAL LOW (ref 3.5–5.0)
Alkaline Phosphatase: 56 U/L (ref 38–126)
Anion gap: 11 (ref 5–15)
BUN: 29 mg/dL — ABNORMAL HIGH (ref 8–23)
CO2: 28 mmol/L (ref 22–32)
Calcium: 9.6 mg/dL (ref 8.9–10.3)
Chloride: 95 mmol/L — ABNORMAL LOW (ref 98–111)
Creatinine, Ser: 1.92 mg/dL — ABNORMAL HIGH (ref 0.44–1.00)
GFR, Estimated: 26 mL/min — ABNORMAL LOW (ref >60)
Glucose, Bld: 127 mg/dL — ABNORMAL HIGH (ref 70–99)
Potassium: 4.7 mmol/L (ref 3.5–5.1)
Sodium: 134 mmol/L — ABNORMAL LOW (ref 135–145)
Total Bilirubin: 0.7 mg/dL (ref 0.3–1.2)
Total Protein: 6.6 g/dL (ref 6.5–8.1)

## 2020-06-26 LAB — MAGNESIUM: Magnesium: 1.8 mg/dL (ref 1.7–2.4)

## 2020-06-26 LAB — TYPE AND SCREEN
ABO/RH(D): A NEG
Antibody Screen: NEGATIVE

## 2020-06-26 LAB — TROPONIN I (HIGH SENSITIVITY)
Troponin I (High Sensitivity): 146 ng/L (ref <18)
Troponin I (High Sensitivity): 185 ng/L (ref <18)

## 2020-06-26 LAB — PROTIME-INR
INR: 1.1 (ref 0.8–1.2)
Prothrombin Time: 14.5 seconds (ref 11.4–15.2)

## 2020-06-26 LAB — CBG MONITORING, ED: Glucose-Capillary: 127 mg/dL — ABNORMAL HIGH (ref 70–99)

## 2020-06-26 MED ORDER — FLUTICASONE FUROATE-VILANTEROL 100-25 MCG/INH IN AEPB
1.0000 | INHALATION_SPRAY | Freq: Every day | RESPIRATORY_TRACT | Status: DC
  Administered 2020-06-27: 1 via RESPIRATORY_TRACT
  Filled 2020-06-26: qty 28

## 2020-06-26 MED ORDER — ISOSORBIDE MONONITRATE ER 30 MG PO TB24
30.0000 mg | ORAL_TABLET | Freq: Every day | ORAL | Status: DC
  Administered 2020-06-27 – 2020-06-28 (×2): 30 mg via ORAL
  Filled 2020-06-26 (×2): qty 1

## 2020-06-26 MED ORDER — APIXABAN 2.5 MG PO TABS
2.5000 mg | ORAL_TABLET | Freq: Two times a day (BID) | ORAL | Status: DC
  Administered 2020-06-27: 2.5 mg via ORAL
  Filled 2020-06-26: qty 1

## 2020-06-26 MED ORDER — UMECLIDINIUM BROMIDE 62.5 MCG/INH IN AEPB
1.0000 | INHALATION_SPRAY | Freq: Every day | RESPIRATORY_TRACT | Status: DC
  Administered 2020-06-27: 1 via RESPIRATORY_TRACT
  Filled 2020-06-26: qty 7

## 2020-06-26 MED ORDER — POLYVINYL ALCOHOL 1.4 % OP SOLN
1.0000 [drp] | Freq: Two times a day (BID) | OPHTHALMIC | Status: DC | PRN
  Filled 2020-06-26: qty 15

## 2020-06-26 MED ORDER — LIDOCAINE-EPINEPHRINE (PF) 2 %-1:200000 IJ SOLN
10.0000 mL | Freq: Once | INTRAMUSCULAR | Status: AC
  Administered 2020-06-26: 10 mL
  Filled 2020-06-26: qty 20

## 2020-06-26 MED ORDER — TORSEMIDE 20 MG PO TABS
20.0000 mg | ORAL_TABLET | Freq: Two times a day (BID) | ORAL | Status: DC
  Administered 2020-06-27: 20 mg via ORAL
  Filled 2020-06-26: qty 1

## 2020-06-26 MED ORDER — ROSUVASTATIN CALCIUM 5 MG PO TABS
10.0000 mg | ORAL_TABLET | Freq: Every day | ORAL | Status: DC
  Administered 2020-06-27 – 2020-06-28 (×2): 10 mg via ORAL
  Filled 2020-06-26 (×2): qty 2

## 2020-06-26 MED ORDER — ONDANSETRON HCL 4 MG/2ML IJ SOLN
4.0000 mg | Freq: Once | INTRAMUSCULAR | Status: AC
  Administered 2020-06-26: 4 mg via INTRAVENOUS

## 2020-06-26 MED ORDER — AMIODARONE HCL 200 MG PO TABS
100.0000 mg | ORAL_TABLET | Freq: Two times a day (BID) | ORAL | Status: DC
  Administered 2020-06-26 – 2020-06-28 (×4): 100 mg via ORAL
  Filled 2020-06-26 (×4): qty 1

## 2020-06-26 MED ORDER — POLYETHYLENE GLYCOL 3350 17 G PO PACK: 17.0000 g | PACK | Freq: Every day | ORAL | Status: DC | PRN

## 2020-06-26 MED ORDER — LEVALBUTEROL HCL 0.63 MG/3ML IN NEBU: 0.6300 mg | INHALATION_SOLUTION | RESPIRATORY_TRACT | Status: DC | PRN

## 2020-06-26 MED ORDER — METOPROLOL TARTRATE 25 MG PO TABS
25.0000 mg | ORAL_TABLET | Freq: Two times a day (BID) | ORAL | Status: DC
  Administered 2020-06-26 – 2020-06-28 (×4): 25 mg via ORAL
  Filled 2020-06-26 (×4): qty 1

## 2020-06-26 MED ORDER — FLUTICASONE-UMECLIDIN-VILANT 100-62.5-25 MCG/INH IN AEPB: 1.0000 | INHALATION_SPRAY | Freq: Every day | RESPIRATORY_TRACT | Status: DC

## 2020-06-26 MED ORDER — PANTOPRAZOLE SODIUM 40 MG PO TBEC
40.0000 mg | DELAYED_RELEASE_TABLET | Freq: Every day | ORAL | Status: DC
  Administered 2020-06-27 – 2020-06-28 (×2): 40 mg via ORAL
  Filled 2020-06-26 (×2): qty 1

## 2020-06-26 MED ORDER — DOCUSATE SODIUM 100 MG PO CAPS: 100.0000 mg | ORAL_CAPSULE | Freq: Every day | ORAL | Status: DC | PRN

## 2020-06-26 MED ORDER — LEVOTHYROXINE SODIUM 25 MCG PO TABS
25.0000 ug | ORAL_TABLET | Freq: Every morning | ORAL | Status: DC
  Administered 2020-06-28: 25 ug via ORAL

## 2020-06-26 MED ORDER — ACETAMINOPHEN 650 MG RE SUPP: 650.0000 mg | Freq: Four times a day (QID) | RECTAL | Status: DC | PRN

## 2020-06-26 MED ORDER — ACETAMINOPHEN 325 MG PO TABS
650.0000 mg | ORAL_TABLET | Freq: Four times a day (QID) | ORAL | Status: DC | PRN
  Administered 2020-06-27: 650 mg via ORAL
  Filled 2020-06-26: qty 2

## 2020-06-26 MED ORDER — SODIUM CHLORIDE 0.9% FLUSH
3.0000 mL | Freq: Two times a day (BID) | INTRAVENOUS | Status: DC
  Administered 2020-06-26 – 2020-06-28 (×4): 3 mL via INTRAVENOUS

## 2020-06-26 NOTE — ED Provider Notes (Signed)
..  Laceration Repair  Date/Time: 06/26/2020 8:50 PM Performed by: Lorayne Bender, PA-C Authorized by: Lorayne Bender, PA-C   Consent:    Consent obtained:  Verbal   Consent given by:  Patient   Risks, benefits, and alternatives were discussed: yes     Risks discussed:  Infection, need for additional repair, poor wound healing, poor cosmetic result and pain Universal protocol:    Procedure explained and questions answered to patient or proxy's satisfaction: yes     Patient identity confirmed:  Verbally with patient and provided demographic data Anesthesia:    Anesthesia method:  None Laceration details:    Location:  Face   Face location:  R eyebrow   Length (cm):  2 Pre-procedure details:    Preparation:  Patient was prepped and draped in usual sterile fashion Exploration:    Wound exploration: wound explored through full range of motion and entire depth of wound visualized   Treatment:    Area cleansed with:  Saline   Amount of cleaning:  Standard   Irrigation solution:  Sterile saline   Irrigation method:  Syringe Skin repair:    Repair method:  Tissue adhesive Approximation:    Approximation:  Close Repair type:    Repair type:  Simple Post-procedure details:    Dressing:  Open (no dressing)   Procedure completion:  Tolerated well, no immediate complications Comments:     Patient made clear she would not tolerate injection or suturing.  Edges of the wound were able to be well approximated with Dermabond.     Layla Maw 06/26/20 2101    Quintella Reichert, MD 06/27/20 1300

## 2020-06-26 NOTE — ED Notes (Signed)
Trop of 146 reported by lab.

## 2020-06-26 NOTE — H&P (Signed)
History and Physical   [name] [id number] DOB: [birth year] DOA: 06/26/2020  PCP: Cassandria Anger, MD   Patient coming from: Home  Chief Complaint: Fall  HPI: [name] is a 85 y.o. female with medical history significant of abdominal pain, heart failure, mural thrombus, CAD status post MI and stent, anxiety, depression, hypertension, CKD 3, COPD, esophageal dysmotility, GERD, hearing loss, A. fib, RLS who presents following a fall at home.  Patient had a fall that was unwitnessed earlier today at home.  She lives alone.  Friend came to check on her and found her on the floor leaned up against her couch.  Her glasses were 10 to 20 feet away and she had a laceration and hematoma on her head.  Patient does not remember the fall and she is on blood thinners due to her A. fib and mural thrombus.  She has no memory of what happened around the time of her fall, but she is currently alert and oriented x4.  Has some ongoing nausea. She denies fevers, chills, chest pain shortness of breath, abdominal pain, constipation, diarrhea.  ED Course: Vital signs in ED stable with some intermittent hypertension to the 170s.  Lab work-up showed CMP with sodium 134, chloride 95, BUN 29, creatinine 1.92 from a baseline of 1.4-2.  Glucose 127, albumin 3.2, AST 71.  CBC showed mild leukocytosis to 10.9.  Imaging included chest x-ray with small bilateral pleural effusions with associated atelectasis and cardiomegaly.  CT head which showed large right frontal scalp hematoma but no acute intracranial abnormality and no fracture.  CT C-spine which showed no acute abnormality but did demonstrate chronic DDD.  Received Zofran in ED.  Review of Systems: As per HPI otherwise all other systems reviewed and are negative.  Past Medical History:  Diagnosis Date  . Anxiety   . Arthritis   . Atrial fibrillation ([place])   . B12 deficiency   . COPD (chronic obstructive pulmonary disease) ([place])   . Depression   .  GERD (gastroesophageal reflux disease)   . Hypertension   . Hypoxia with need for home oxygen  03/14/2020  . Mural thrombus of left atrium 03/09/20 03/14/2020  . Osteoarthrosis, hand    both hands  . Peptic ulcer, unspecified site, unspecified as acute or chronic, without mention of hemorrhage, perforation, or obstruction   . PONV (postoperative nausea and vomiting)   . Vitamin D deficiency disease     Past Surgical History:  Procedure Laterality Date  . BLADDER SURGERY     Bladder tack and intestines  . CARDIOVERSION N/A 05/11/2020   Procedure: CARDIOVERSION;  Surgeon: Lelon Perla, MD;  Location: [facility] ENDOSCOPY;  Service: Cardiovascular;  Laterality: N/A;  . CORONARY/GRAFT ACUTE MI REVASCULARIZATION N/A 08/26/2019   Procedure: Coronary/Graft Acute MI Revascularization;  Surgeon: Sherren Mocha, MD;  Location: [facility] CV LAB;  Service: Cardiovascular;  Laterality: N/A;  . CYSTOCELE REPAIR    . CYSTOSCOPY WITH RETROGRADE PYELOGRAM, URETEROSCOPY AND STENT PLACEMENT Right 09/11/2016   Procedure: CYSTOSCOPY WITH RETROGRADE PYELOGRAM, URETEROSCOPY AND STENT REPLACEMENT;  Surgeon: Cleon Gustin, MD;  Location: [facility] ORS;  Service: Urology;  Laterality: Right;  . CYSTOSCOPY WITH STENT PLACEMENT Right 08/11/2016   Procedure: CYSTOSCOPY, URETERSCOPY,  RIGHT RETROGRADE WITH RIGHT STENT PLACEMENT;  Surgeon: Festus Aloe, MD;  Location: [facility] ORS;  Service: Urology;  Laterality: Right;  . SPLENECTOMY    . TEE WITHOUT CARDIOVERSION N/A 03/10/2020   Procedure: TRANSESOPHAGEAL ECHOCARDIOGRAM (TEE);  Surgeon: Josue Hector,  MD;  Location: [facility];  Service: Cardiovascular;  Laterality: N/A;  . TEE WITHOUT CARDIOVERSION N/A 05/11/2020   Procedure: TRANSESOPHAGEAL ECHOCARDIOGRAM (TEE);  Surgeon: Lelon Perla, MD;  Location: [facility] ENDOSCOPY;  Service: Cardiovascular;  Laterality: N/A;    Social History  reports that she quit smoking about 12 years ago. Her smoking use included cigarettes. She  smoked 1.00 pack per day. She has never used smokeless tobacco. She reports that she does not drink alcohol and does not use drugs.  Allergies  Allergen Reactions  . Amoxicillin Other (See Comments)    "makes me crazy" Has patient had a PCN reaction causing immediate rash, facial/tongue/throat swelling, SOB or lightheadedness with hypotension: No Has patient had a PCN reaction causing severe rash involving mucus membranes or skin necrosis: No Has patient had a PCN reaction that required hospitalization: No Has patient had a PCN reaction occurring within the last 10 years: No If all of the above answers are "NO", then may proceed with Cephalosporin use.   . Codeine Nausea And Vomiting  . Levaquin [Levofloxacin In D5w] Nausea And Vomiting  . Other Nausea And Vomiting    Pt does not want to take any pain meds    Family History  Problem Relation Age of Onset  . Heart disease Father   . Arthritis Other        Family history of  . Coronary artery disease Other        Family history of  . Hypertension Other        Family history of  . Diabetes Daughter        83 died  Reviewed on admission  Prior to Admission medications   Medication Sig Start Date End Date Taking? Authorizing Provider  acetaminophen (TYLENOL) 325 MG tablet Take 2 tablets (650 mg total) by mouth every 4 (four) hours as needed for headache or mild pain. Patient not taking: Reported on 06/24/2020 03/14/20   Isaiah Serge, NP  amiodarone (PACERONE) 200 MG tablet Take 1 tablet (200 mg total) by mouth daily. Patient taking differently: Take 100 mg by mouth 2 (two) times daily. 04/27/20   Almyra Deforest, PA  apixaban (ELIQUIS) 2.5 MG TABS tablet Take 1 tablet (2.5 mg total) by mouth 2 (two) times daily. 03/25/20   Lelon Perla, MD  Biotin 1000 MCG tablet Take 1,000 mcg by mouth daily. Patient not taking: Reported on 06/24/2020    [provider]  Cholecalciferol 25 MCG (1000 UT) capsule Take 1,000 Units by mouth  daily.    [provider]  clonazePAM (KLONOPIN) 0.5 MG tablet Take 1 tablet (0.5 mg total) by mouth at bedtime as needed (leg cramps). TAKE 1 TABLET BY MOUTH AT BEDTIME AS NEEDED FOR LEG CRAMPS 03/14/20   Isaiah Serge, NP  cyanocobalamin 1000 MCG tablet Take 1,000 mcg by mouth daily. Patient not taking: Reported on 06/24/2020    [provider]  docusate sodium (COLACE) 100 MG capsule Take 1 capsule (100 mg total) by mouth daily as needed for mild constipation. 03/14/20   Isaiah Serge, NP  Fluticasone-Umeclidin-Vilant (TRELEGY ELLIPTA) 100-62.5-25 MCG/INH AEPB Inhale 1 Inhaler into the lungs daily. 12/03/19   Plotnikov, Evie Lacks, MD  hydrALAZINE (APRESOLINE) 50 MG tablet Take 1 tablet (50 mg total) by mouth 3 (three) times daily. 06/25/20 09/23/20  Minus Breeding, MD  hydrocerin (EUCERIN) CREA Apply 1 application topically 2 (two) times daily. Patient not taking: Reported on 06/24/2020 03/14/20   Isaiah Serge,  NP  isosorbide mononitrate (IMDUR) 30 MG 24 hr tablet Take 1 tablet (30 mg total) by mouth daily. 06/25/20   Minus Breeding, MD  ketoconazole (NIZORAL) 2 % cream APPLY TOPICALLY TO THE AFFECTED AREA DAILY Patient not taking: Reported on 06/24/2020 08/11/19   Plotnikov, Evie Lacks, MD  levalbuterol [facility] HFA) 45 MCG/ACT inhaler Inhale 2 puffs into the lungs every 4 (four) hours as needed for wheezing or shortness of breath. 04/16/20   Margaretha Seeds, MD  levothyroxine (SYNTHROID) 25 MCG tablet Take 25 mcg by mouth every morning. 03/30/20   [provider]  megestrol (MEGACE) 400 MG/10ML suspension Take 5 mLs (200 mg total) by mouth 2 (two) times daily. 06/14/20 07/14/20  Plotnikov, Evie Lacks, MD  metoprolol tartrate (LOPRESSOR) 25 MG tablet Take 1 tablet (25 mg total) by mouth 2 (two) times daily. 06/22/20 09/20/20  Lelon Perla, MD  nitroGLYCERIN (NITROSTAT) 0.4 MG SL tablet Place 1 tablet (0.4 mg total) under the tongue every 5 (five) minutes as needed for chest  pain. 08/28/19 08/27/20  Kathyrn Drown D, NP  nystatin (MYCOSTATIN) 100000 UNIT/ML suspension Take 5 mLs (500,000 Units total) by mouth 4 (four) times daily. Patient not taking: Reported on 06/24/2020 03/15/20   Hoyt Koch, MD  pantoprazole (PROTONIX) 40 MG tablet Take 1 tablet (40 mg total) by mouth daily. 03/25/20   Lelon Perla, MD  potassium chloride (KLOR-CON) 10 MEQ tablet Take 1 tablet (10 mEq total) by mouth daily. Patient taking differently: Take 10 mEq by mouth 2 (two) times daily. 03/25/20   Lelon Perla, MD  Propylene Glycol (SYSTANE BALANCE) 0.6 % SOLN Place 1 drop into both eyes 2 (two) times daily as needed (dry eyes).    [provider]  rosuvastatin (CRESTOR) 10 MG tablet Take 1 tablet (10 mg total) by mouth daily. 03/25/20   Lelon Perla, MD  torsemide (DEMADEX) 20 MG tablet Take 40 mg (2 tablets) in the morning and 20 mg (one tablet) in the evening 06/25/20   Minus Breeding, MD    Physical Exam: Vitals:   06/26/20 2115 06/26/20 2130 06/26/20 2135 06/26/20 2145  BP: (!) 171/62 (!) 174/61 (!) 174/61 (!) 166/59  Pulse: 67 68 71 69  Resp: '13 17  13  '$ Temp:      SpO2: 95% 96%  93%  Weight:      Height:       Physical Exam Constitutional:      Comments: Thin elderly female with hematoma on right forehead.  HENT:     Head: Normocephalic and atraumatic.     Mouth/Throat:     Mouth: Mucous membranes are moist.     Pharynx: Oropharynx is clear.  Eyes:     Extraocular Movements: Extraocular movements intact.     Pupils: Pupils are equal, round, and reactive to light.  Cardiovascular:     Rate and Rhythm: Normal rate and regular rhythm.     Pulses: Normal pulses.     Heart sounds: Normal heart sounds.  Pulmonary:     Effort: Pulmonary effort is normal. No respiratory distress.     Breath sounds: Normal breath sounds.  Abdominal:     General: Bowel sounds are normal. There is no distension.     Palpations: Abdomen is soft.     Tenderness:  There is no abdominal tenderness.  Musculoskeletal:        General: No swelling or deformity.  Skin:    General: Skin is warm and  dry.  Neurological:     General: No focal deficit present.     Mental Status: She is oriented to person, place, and time.     Comments: Mental Status: Patient is awake, alert, oriented x3 No signs of aphasia or neglect Cranial Nerves: II: Pupils equal, round, and reactive to light.  III,IV, VI: EOMI without ptosis or diploplia.  V: Facial sensation is symmetric tolight touch. VII: Facial movement is symmetric.  VIII: hearing is intact to voice X: Uvula elevates symmetrically XI: Shoulder shrug is symmetric. XII: tongue is midline without atrophy or fasciculations.  Motor: Decreased effort thorughout, at Least 4-5/5 bilateral UE, 4-5/5 bilateral lower extremitiy Sensory: Sensation is grossly intact bilateral UEs & LEs    Labs on Admission: I have personally reviewed following labs and imaging studies  CBC: Recent Labs  Lab 06/23/20 2308 06/26/20 0004 06/26/20 1830  WBC 10.0 10.9* 15.4*  NEUTROABS 6.7 7.9* 13.2*  HGB 12.5 12.3 12.5  HCT 39.0 38.0 39.0  MCV 83.3 83.0 83.3  PLT 294 325 [id number]    Basic Metabolic Panel: Recent Labs  Lab 06/23/20 2308 06/25/20 1407 06/26/20 0004 06/26/20 1830  NA 138 139 132* 134*  K 2.8* 4.5 4.1 4.7  CL 92* 100 93* 95*  CO2 36* '22 28 28  '$ GLUCOSE 82 103* 91 127*  BUN 29* 18 28* 29*  CREATININE 1.98* 1.44* 1.96* 1.92*  CALCIUM 9.1 9.3 9.5 9.6    GFR: Estimated Creatinine Clearance: 15.7 mL/min (A) (by C-G formula based on SCr of 1.92 mg/dL (H)).  Liver Function Tests: Recent Labs  Lab 06/26/20 1830  AST 71*  ALT 28  ALKPHOS 56  BILITOT 0.7  PROT 6.6  ALBUMIN 3.2*    Urine analysis:    Component Value Date/Time   COLORURINE STRAW (A) 09/15/2019 2045   APPEARANCEUR CLEAR 09/15/2019 2045   LABSPEC 1.005 09/15/2019 2045   PHURINE 7.0 09/15/2019 2045   GLUCOSEU NEGATIVE 09/15/2019 2045    GLUCOSEU NEGATIVE 08/23/2016 1349   HGBUR NEGATIVE 09/15/2019 2045   BILIRUBINUR NEGATIVE 09/15/2019 2045   BILIRUBINUR negative 08/10/2016 1538   KETONESUR NEGATIVE 09/15/2019 2045   PROTEINUR 100 (A) 09/15/2019 2045   UROBILINOGEN 0.2 09/29/2016 1155   NITRITE NEGATIVE 09/15/2019 2045   LEUKOCYTESUR NEGATIVE 09/15/2019 2045    Radiological Exams on Admission: CT Head Wo Contrast  Result Date: 06/26/2020 CLINICAL DATA:  85 year old post fall on blood thinners. Struck head with right forehead bruising. EXAM: CT HEAD WITHOUT CONTRAST TECHNIQUE: Contiguous axial images were obtained from the base of the skull through the vertex without intravenous contrast. COMPARISON:  Head CT 09/15/2019 FINDINGS: Brain: No intracranial hemorrhage, mass effect, or midline shift. Normal for age atrophy. No hydrocephalus. The basilar cisterns are patent. No evidence of territorial infarct or acute ischemia. No extra-axial or intracranial fluid collection. Vascular: Atherosclerosis of skullbase vasculature without hyperdense vessel or abnormal calcification. Skull: No fracture or focal lesion. Sinuses/Orbits: No acute fracture. Chronic opacification of right mastoid air cells. Bilateral cataract resection. Other: Large right frontal scalp hematoma. IMPRESSION: Large right frontal scalp hematoma. No acute intracranial abnormality. No skull fracture. Electronically Signed   By: Keith Rake M.D.   On: 06/26/2020 19:25   CT Cervical Spine Wo Contrast  Result Date: 06/26/2020 CLINICAL DATA:  Neck trauma.  Fall striking head. EXAM: CT CERVICAL SPINE WITHOUT CONTRAST TECHNIQUE: Multidetector CT imaging of the cervical spine was performed without intravenous contrast. Multiplanar CT image reconstructions were also generated. COMPARISON:  None. FINDINGS: Alignment:  No traumatic subluxation. Trace degenerative anterolisthesis of C5 on C6. Mild broad-based dextroscoliotic curvature. Skull base and vertebrae: No acute  fracture. Vertebral body heights are maintained. The dens and skull base are intact. Occasional lucencies in the cervical spine are felt to be related to underlying osteopenia/osteoporosis. Bone island within T4 vertebral body. Soft tissues and spinal canal: No prevertebral fluid or swelling. No visible canal hematoma. Disc levels: Multilevel degenerative disc disease, most prominently affecting C6-C7. Multilevel facet hypertrophy. Upper chest: Biapical pleuroparenchymal scarring. Emphysema. No acute apical findings. Other: None. IMPRESSION: 1. No acute fracture or subluxation of the cervical spine. 2. Multilevel degenerative disc disease and facet hypertrophy. Electronically Signed   By: Keith Rake M.D.   On: 06/26/2020 19:28   DG Chest Port 1 View  Result Date: 06/26/2020 CLINICAL DATA:  Fall with nausea, back pain. EXAM: PORTABLE CHEST 1 VIEW COMPARISON:  None. FINDINGS: The heart is enlarged. Vascular calcifications are seen in the aortic arch. The costophrenic angles are blunted, likely reflecting small pleural effusions with associated atelectasis. No pneumothorax. Degenerative changes are seen in the spine. IMPRESSION: 1. Blunted costophrenic angles likely reflects small bilateral pleural effusions with associated atelectasis. 2. Cardiomegaly. Electronically Signed   By: Zerita Boers M.D.   On: 06/26/2020 18:46    EKG: Independently reviewed.  Not yet obtained today however EKG from visit last night shows sinus rhythm with first-degree AV block and PACs.  Assessment/Plan Principal Problem:   SYNCOPE Active Problems:   Benign essential HTN   Persistent atrial fibrillation (HCC)   COPD with chronic bronchitis (HCC)   GERD   CAD S/P percutaneous coronary angioplasty 08/2019   Dyslipidemia, goal LDL below 70   CKD (chronic kidney disease), stage III (HCC)   Chronic combined systolic and diastolic heart failure (HCC)   Mural thrombus of left atrium 03/09/20   Concussion   Hematoma of  frontal scalp  Fall Hematoma Syncope and collapse > Patient presenting after unwitnessed fall at home about which patient has no memory. > She was found on the ground near her couch by a friend and her glasses were 10 to 20 feet away and she had a laceration and hematoma on her head. > Laceration repaired in the ED.  CT head showed large right frontal scalp hematoma but no intracranial process.  CT C-spine showed no acute abnormality but did demonstrate her chronic DDD.  Chest x-ray showed only small bilateral pleural effusions and cardiomegaly. > Unclear etiology of syncope as patient has concussive symptoms and does not remember the events around the fall.  Concern for possible cardiogenic given her history.  Also could be orthostatic/soft blood pressure as her blood pressure does appear to be labile considering she was seen last night for hypertension and was hypertensive initially on presentation today and both results on their own. - Monitor on telemetry - Orthostatics - Repeat echocardiogram - Follow-up repeat EKG  Confusion > Believed to be concussive symptoms > No focal neurologic deficit - If persistent or focal neurologic deficit develops consider MRI and neurology consult. - Continue to monitor  Leukocytosis > Likely reactive.  No signs of fever or other signs of infection.  Not significantly elevated from baseline. - Trend CBC  ?AKI CKD 3 > Patient with history of CKD creatinine elevated 1.4-2 currently at 1.92, could be mildly elevated. - Avoid nephrotoxic agents - Trend renal function and electrolytes  CAD Demand ischemia Congestive heart failure > History of CAD status post MI and stent placement >  Noted to have mural thrombus in January 22 > Last echo in January with EF 30-40%, global hypokinesis, moderately reduced RV systolic function. > Mural thrombus noted on TEE 1/5 but not noted on repeat TEE on 3/8 > Initial troponin elevated to 146 she is in some mild  elevations in the past this is suspected to be demand ischemia considering her recent fall.  We will trend troponin. - Appears euvolemic we will continue home medications - Continue home Imdur, metoprolol, torsemide - Trend troponin  Atrial fibrillation - Hold tonight's dose of anticoagulation and resume Eliquis tomorrow morning - Continue home metoprolol and amiodarone  Hypertension > Blood pressure has been variable in the last few days, he is hypertensive with improvement and then become hypertensive again. - Checking orthostatics as above - Continuing home Imdur, metoprolol, torsemide - Holding home hydralazine  Hypothyroidism -Continue home Synthroid  COPD -Continue home Trelegy and as needed albuterol  GERD -Continue PPI  DVT prophylaxis: On Eliquis, holding overnight due to hematoma with plan to resume in the morning.   Code Status:   Full Family Communication:  Attempted to contact Sister [name] by phone but it appears someone else answered but I could not understand what they were saying, it sounds like there is denying that this was the wrong number. Disposition Plan:   Patient is from:  Home  Anticipated DC to:  Pending clinical course  Anticipated DC date:  1 to 3 days  Anticipated DC barriers: None  Consults called:  None  Admission status:  Observation, telemetry   Severity of Illness: The appropriate patient status for this patient is OBSERVATION. Observation status is judged to be reasonable and necessary in order to provide the required intensity of service to ensure the patient's safety. The patient's presenting symptoms, physical exam findings, and initial radiographic and laboratory data in the context of their medical condition is felt to place them at decreased risk for further clinical deterioration. Furthermore, it is anticipated that the patient will be medically stable for discharge from the hospital within 2 midnights of admission. The following factors  support the patient status of observation.   " The patient's presenting symptoms include fall, hematoma, confusion. " The physical exam findings include elderly female with scalp hematoma. " The initial radiographic and laboratory data are creatinine 1.92, sodium 134, chloride 95, BUN 29, glucose 127, WBC 10.9, troponin 146.  CT head with hematoma but no intracranial abnormality.  Marcelyn Bruins MD [facility]  How to contact the [facility] Attending or Consulting provider [place] or covering provider during after hours [place], for this patient?   1. Check the care team in [facility] and look for a) attending/consulting [facility] provider listed and b) the [facility] team listed 2. Log into [email or web address] and use [place]'s universal password to access. If you do not have the password, please contact the hospital operator. 3. Locate the [facility] provider you are looking for under [facility] and page to a number that you can be directly reached. 4. If you still have difficulty reaching the provider, please page the [facility] (Director on Call) for the Hospitalists listed on amion for assistance.  06/26/2020, 10:22 PM

## 2020-06-26 NOTE — ED Provider Notes (Signed)
[facility] EMERGENCY DEPARTMENT Provider Note   CSN: [id number] Arrival date & time: 06/25/20  2340     History Chief Complaint  Patient presents with  . Hypertension    SEMIRA Coffey is a 85 y.o. female.  The history is provided by the patient.  Hypertension This is a chronic problem. The current episode started more than 1 week ago. The problem occurs constantly. The problem has been gradually worsening. Pertinent negatives include no chest pain, no abdominal pain, no headaches and no shortness of breath. Nothing aggravates the symptoms. Nothing relieves the symptoms. Treatments tried: home BP regimen. The treatment provided no relief.  Patient with known HTN who states her medication was adjusted and BPs continue to be elevated.  A nurse came out to sort her current regimen by time but this has not improved the numbers.  No CP, no SOB.       Past Medical History:  Diagnosis Date  . Anxiety   . Arthritis   . Atrial fibrillation ([place])   . B12 deficiency   . COPD (chronic obstructive pulmonary disease) ([place])   . Depression   . GERD (gastroesophageal reflux disease)   . Hypertension   . Hypoxia with need for home oxygen  03/14/2020  . Mural thrombus of left atrium 03/09/20 03/14/2020  . Osteoarthrosis, hand    both hands  . Peptic ulcer, unspecified site, unspecified as acute or chronic, without mention of hemorrhage, perforation, or obstruction   . PONV (postoperative nausea and vomiting)   . Vitamin D deficiency disease     Patient Active Problem List   Diagnosis Date Noted  . Vision changes 06/14/2020  . Mural thrombus of left atrium 03/09/20 03/14/2020  . Hypoxia with need for home oxygen  03/14/2020  . Acute combined systolic and diastolic heart failure ([place]) 03/08/2020  . Stomatitis 01/21/2020  . Edema 12/03/2019  . AKI (acute kidney injury) ([place])   . Acute diastolic CHF (congestive heart failure) ([place])   . Hypokalemia 09/15/2019  . CKD (chronic  kidney disease), stage III ([place]) 09/15/2019  . Elevated troponin 09/15/2019  . Hypertensive emergency 09/15/2019  . Dyslipidemia, goal LDL below 70 08/27/2019  . Acute ST elevation myocardial infarction (STEMI) of lateral wall ([place]) 08/26/2019  . Heart attack ([place]) 08/26/2019  . Ankle pain, left 07/23/2019  . Intertrigo 02/05/2019  . Thrush 01/10/2019  . Shingles 09/16/2018  . CAD S/P percutaneous coronary angioplasty 08/2019 05/06/2018  . Esophageal dysmotility 01/11/2018  . Weight gain 01/01/2018  . Abdominal bloating 01/01/2018  . Hyperkalemia 06/04/2017  . Grief at loss of child 04/02/2017  . Adenopathy, cervical 11/29/2016  . Dysuria 10/31/2016  . Vertigo 10/31/2016  . Ureteral calculus 09/11/2016  . CAP (community acquired pneumonia) 08/30/2016  . Fatigue 08/24/2016  . Sensation of pressure in bladder area 08/24/2016  . Kidney stone 08/13/2016  . Flank pain 08/10/2016  . Right upper quadrant abdominal tenderness without rebound tenderness 08/10/2016  . Leukocytosis 08/10/2016  . Acute abdomen 08/10/2016  . Intractable nausea and vomiting 08/10/2016  . ARF (acute renal failure) ([place]) 08/10/2016  . Urolithiasis 08/10/2016  . Abdominal pain 08/10/2016  . Anemia 05/19/2016  . Generalized anxiety disorder 01/14/2016  . Dysphagia 04/16/2015  . Dry skin dermatitis 12/28/2014  . Atrophic vaginitis 05/26/2014  . Varicose veins of both lower extremities 12/12/2013  . Restless leg syndrome 03/17/2013  . Impacted cerumen of left ear 03/17/2013  . Osteoarthritis, hand 09/05/2011  . Epistaxis, recurrent 08/17/2011  .  Well adult exam 01/31/2011  . Low back pain 01/31/2011  . Palpitations 05/24/2010  . Cerumen impaction 02/23/2010  . PHARYNGITIS, ACUTE 02/23/2010  . SINUSITIS, ACUTE 01/07/2010  . SOB (shortness of breath) 09/23/2009  . BRONCHITIS, ACUTE 08/03/2009  . Osteoarthritis 07/06/2009  . TOBACCO USE, QUIT 03/08/2009  . ALLERGIC RHINITIS 11/06/2008  . NAUSEA ALONE  08/19/2008  . DIARRHEA 08/19/2008  . Benign essential HTN 07/15/2008  . EMPHYSEMA 07/15/2008  . Peptic ulcer 07/15/2008  . ARTHRITIS 07/15/2008  . HEADACHE 05/25/2008  . SYNCOPE 03/23/2008  . WEIGHT LOSS, ABNORMAL 03/23/2008  . HEARING IMPAIRMENT 12/23/2007  . B12 deficiency 10/14/2007  . Malaise 10/14/2007  . INSOMNIA, PERSISTENT 07/31/2007  . Anxiety state 03/01/2007  . DEPRESSION 03/01/2007  . Persistent atrial fibrillation ([place]) 02/26/2007  . Vitamin D deficiency 02/19/2007  . GERD 02/19/2007  . COPD with chronic bronchitis ([place]) 08/07/2006    Past Surgical History:  Procedure Laterality Date  . BLADDER SURGERY     Bladder tack and intestines  . CARDIOVERSION N/A 05/11/2020   Procedure: CARDIOVERSION;  Surgeon: Lelon Perla, MD;  Location[facility] ENDOSCOPY;  Service: Cardiovascular;  Laterality: N/A;  . CORONARY/GRAFT ACUTE MI REVASCULARIZATION N/A 08/26/2019   Procedure: Coronary/Graft Acute MI Revascularization;  Surgeon: Sherren Mocha, MD;  Location: [place] [facility] CV LAB;  Service: Cardiovascular;  Laterality: N/A;  . CYSTOCELE REPAIR    . CYSTOSCOPY WITH RETROGRADE PYELOGRAM, URETEROSCOPY AND STENT PLACEMENT Right 09/11/2016   Procedure: CYSTOSCOPY WITH RETROGRADE PYELOGRAM, URETEROSCOPY AND STENT REPLACEMENT;  Surgeon: Cleon Gustin, MD;  Location: [facility] ORS;  Service: Urology;  Laterality: Right;  . CYSTOSCOPY WITH STENT PLACEMENT Right 08/11/2016   Procedure: CYSTOSCOPY, URETERSCOPY,  RIGHT RETROGRADE WITH RIGHT STENT PLACEMENT;  Surgeon: Festus Aloe, MD;  Location: [facility] ORS;  Service: Urology;  Laterality: Right;  . SPLENECTOMY    . TEE WITHOUT CARDIOVERSION N/A 03/10/2020   Procedure: TRANSESOPHAGEAL ECHOCARDIOGRAM (TEE);  Surgeon: Josue Hector, MD;  Location: [facility] ENDOSCOPY;  Service: Cardiovascular;  Laterality: N/A;  . TEE WITHOUT CARDIOVERSION N/A 05/11/2020   Procedure: TRANSESOPHAGEAL ECHOCARDIOGRAM (TEE);  Surgeon: Lelon Perla, MD;  Location: [facility]  ENDOSCOPY;  Service: Cardiovascular;  Laterality: N/A;     OB History   No obstetric history on file.     Family History  Problem Relation Age of Onset  . Heart disease Father   . Arthritis Other        Family history of  . Coronary artery disease Other        Family history of  . Hypertension Other        Family history of  . Diabetes Daughter        95 died    Social History   Tobacco Use  . Smoking status: Former Smoker    Packs/day: 1.00    Types: Cigarettes    Quit date: 2010    Years since quitting: 12.3  . Smokeless tobacco: Never Used  . Tobacco comment: Pt unsure how many years she has been smoking   Vaping Use  . Vaping Use: Never used  Substance Use Topics  . Alcohol use: No  . Drug use: No    Home Medications Prior to Admission medications   Medication Sig Start Date End Date Taking? Authorizing Provider  acetaminophen (TYLENOL) 325 MG tablet Take 2 tablets (650 mg total) by mouth every 4 (four) hours as needed for headache or mild pain. Patient not taking: Reported on 06/24/2020 03/14/20   Cecilie Kicks  R, NP  amiodarone (PACERONE) 200 MG tablet Take 1 tablet (200 mg total) by mouth daily. Patient taking differently: Take 100 mg by mouth 2 (two) times daily. 04/27/20   Almyra Deforest, PA  apixaban (ELIQUIS) 2.5 MG TABS tablet Take 1 tablet (2.5 mg total) by mouth 2 (two) times daily. 03/25/20   Lelon Perla, MD  Biotin 1000 MCG tablet Take 1,000 mcg by mouth daily. Patient not taking: Reported on 06/24/2020    [provider]  Cholecalciferol 25 MCG (1000 UT) capsule Take 1,000 Units by mouth daily.    [provider]  clonazePAM (KLONOPIN) 0.5 MG tablet Take 1 tablet (0.5 mg total) by mouth at bedtime as needed (leg cramps). TAKE 1 TABLET BY MOUTH AT BEDTIME AS NEEDED FOR LEG CRAMPS 03/14/20   Isaiah Serge, NP  cyanocobalamin 1000 MCG tablet Take 1,000 mcg by mouth daily. Patient not taking: Reported on 06/24/2020    [provider]  docusate sodium (COLACE) 100 MG capsule Take 1 capsule (100 mg total) by mouth daily as needed for mild constipation. 03/14/20   Isaiah Serge, NP  Fluticasone-Umeclidin-Vilant (TRELEGY ELLIPTA) 100-62.5-25 MCG/INH AEPB Inhale 1 Inhaler into the lungs daily. 12/03/19   Plotnikov, Evie Lacks, MD  hydrALAZINE (APRESOLINE) 50 MG tablet Take 1 tablet (50 mg total) by mouth 3 (three) times daily. 06/25/20 09/23/20  Minus Breeding, MD  hydrocerin (EUCERIN) CREA Apply 1 application topically 2 (two) times daily. Patient not taking: Reported on 06/24/2020 03/14/20   Isaiah Serge, NP  isosorbide mononitrate (IMDUR) 30 MG 24 hr tablet Take 1 tablet (30 mg total) by mouth daily. 06/25/20   Minus Breeding, MD  ketoconazole (NIZORAL) 2 % cream APPLY TOPICALLY TO THE AFFECTED AREA DAILY Patient not taking: Reported on 06/24/2020 08/11/19   Plotnikov, Evie Lacks, MD  levalbuterol [facility] HFA) 45 MCG/ACT inhaler Inhale 2 puffs into the lungs every 4 (four) hours as needed for wheezing or shortness of breath. 04/16/20   Margaretha Seeds, MD  levothyroxine (SYNTHROID) 25 MCG tablet Take 25 mcg by mouth every morning. 03/30/20   [provider]  megestrol (MEGACE) 400 MG/10ML suspension Take 5 mLs (200 mg total) by mouth 2 (two) times daily. 06/14/20 07/14/20  Plotnikov, Evie Lacks, MD  metoprolol tartrate (LOPRESSOR) 25 MG tablet Take 1 tablet (25 mg total) by mouth 2 (two) times daily. 06/22/20 09/20/20  Lelon Perla, MD  nitroGLYCERIN (NITROSTAT) 0.4 MG SL tablet Place 1 tablet (0.4 mg total) under the tongue every 5 (five) minutes as needed for chest pain. 08/28/19 08/27/20  Kathyrn Drown D, NP  nystatin (MYCOSTATIN) 100000 UNIT/ML suspension Take 5 mLs (500,000 Units total) by mouth 4 (four) times daily. Patient not taking: Reported on 06/24/2020 03/15/20   Hoyt Koch, MD  pantoprazole (PROTONIX) 40 MG tablet Take 1 tablet (40 mg total) by mouth daily. 03/25/20   Lelon Perla, MD  potassium  chloride (KLOR-CON) 10 MEQ tablet Take 1 tablet (10 mEq total) by mouth daily. Patient taking differently: Take 10 mEq by mouth 2 (two) times daily. 03/25/20   Lelon Perla, MD  Propylene Glycol (SYSTANE BALANCE) 0.6 % SOLN Place 1 drop into both eyes 2 (two) times daily as needed (dry eyes).    [provider]  rosuvastatin (CRESTOR) 10 MG tablet Take 1 tablet (10 mg total) by mouth daily. 03/25/20   Lelon Perla, MD  torsemide (DEMADEX) 20 MG tablet Take 40 mg (2 tablets) in the  morning and 20 mg (one tablet) in the evening 06/25/20   Minus Breeding, MD    Allergies    Amoxicillin, Codeine, Levaquin [levofloxacin in d5w], and Other  Review of Systems   Review of Systems  Constitutional: Negative for fever.  HENT: Negative for congestion.   Eyes: Negative for visual disturbance.  Respiratory: Negative for shortness of breath.   Cardiovascular: Negative for chest pain.  Gastrointestinal: Negative for abdominal pain.  Genitourinary: Negative for difficulty urinating.  Musculoskeletal: Negative for neck stiffness.  Skin: Negative for rash.  Neurological: Negative for facial asymmetry and headaches.  Psychiatric/Behavioral: Negative for agitation.  All other systems reviewed and are negative.   Physical Exam Updated Vital Signs BP (!) 175/86 (BP Location: Left Arm)   Pulse 69   Temp 98.4 F (36.9 C)   Resp 17   Ht '5\' 4"'$  (1.626 m)   Wt 44 kg   SpO2 95%   BMI 16.65 kg/m   Physical Exam Vitals and nursing note reviewed.  Constitutional:      General: She is not in acute distress.    Appearance: Normal appearance.  HENT:     Head: Normocephalic and atraumatic.     Nose: Nose normal.  Eyes:     Conjunctiva/sclera: Conjunctivae normal.     Pupils: Pupils are equal, round, and reactive to light.  Cardiovascular:     Rate and Rhythm: Normal rate and regular rhythm.     Pulses: Normal pulses.     Heart sounds: Normal heart sounds.  Pulmonary:     Effort:  Pulmonary effort is normal.     Breath sounds: Normal breath sounds.  Abdominal:     General: Abdomen is flat. Bowel sounds are normal.     Palpations: Abdomen is soft.     Tenderness: There is no abdominal tenderness. There is no guarding.  Musculoskeletal:        General: Normal range of motion.     Cervical back: Normal range of motion and neck supple.     Right lower leg: No edema.     Left lower leg: No edema.  Skin:    General: Skin is warm and dry.     Capillary Refill: Capillary refill takes less than 2 seconds.  Neurological:     General: No focal deficit present.     Mental Status: She is alert.     Deep Tendon Reflexes: Reflexes normal.  Psychiatric:        Mood and Affect: Mood normal.     ED Results / Procedures / Treatments   Labs (all labs ordered are listed, but only abnormal results are displayed) Results for orders placed or performed during the hospital encounter of 06/25/20  CBC with Differential  Result Value Ref Range   WBC 10.9 (H) 4.0 - 10.5 K/uL   RBC 4.58 3.87 - 5.11 MIL/uL   Hemoglobin 12.3 12.0 - 15.0 g/dL   HCT 38.0 36.0 - 46.0 %   MCV 83.0 80.0 - 100.0 fL   MCH 26.9 26.0 - 34.0 pg   MCHC 32.4 30.0 - 36.0 g/dL   RDW 22.6 (H) 11.5 - 15.5 %   Platelets 325 150 - 400 K/uL   nRBC 0.0 0.0 - 0.2 %   Neutrophils Relative % 74 %   Neutro Abs 7.9 (H) 1.7 - 7.7 K/uL   Lymphocytes Relative 12 %   Lymphs Abs 1.4 0.7 - 4.0 K/uL   Monocytes Relative 13 %   Monocytes Absolute  1.5 (H) 0.1 - 1.0 K/uL   Eosinophils Relative 1 %   Eosinophils Absolute 0.1 0.0 - 0.5 K/uL   Basophils Relative 0 %   Basophils Absolute 0.0 0.0 - 0.1 K/uL   Immature Granulocytes 0 %   Abs Immature Granulocytes 0.03 0.00 - 0.07 K/uL  Basic metabolic panel  Result Value Ref Range   Sodium 132 (L) 135 - 145 mmol/L   Potassium 4.1 3.5 - 5.1 mmol/L   Chloride 93 (L) 98 - 111 mmol/L   CO2 28 22 - 32 mmol/L   Glucose, Bld 91 70 - 99 mg/dL   BUN 28 (H) 8 - 23 mg/dL    Creatinine, Ser 1.96 (H) 0.44 - 1.00 mg/dL   Calcium 9.5 8.9 - 10.3 mg/dL   GFR, Estimated 25 (L) >60 mL/min   Anion gap 11 5 - 15   No results found.  Radiology No results found.  Procedures Procedures   Medications Ordered in ED Medications - No data to display  ED Course  I have reviewed the triage vital signs and the nursing notes.  Pertinent labs & imaging results that were available during my care of the patient were reviewed by me and considered in my medical decision making (see chart for details).    Based on the [place] [facility] there is no indication for emergent lowering of BP.  BP improved without intervention.  Patient is stable for discharge with close follow up.  Secure chat sent to Dr. Stanford Breed regarding patient.    [name] was evaluated in Emergency Department on 06/26/2020 for the symptoms described in the history of present illness. She was evaluated in the context of the global COVID-19 pandemic, which necessitated consideration that the patient might be at risk for infection with the SARS-CoV-2 virus that causes COVID-19. Institutional protocols and algorithms that pertain to the evaluation of patients at risk for COVID-19 are in a state of rapid change based on information released by regulatory bodies including the CDC and federal and state organizations. These policies and algorithms were followed during the patient's care in the ED.  Final Clinical Impression(s) / ED Diagnoses  Return for intractable cough, coughing up blood, fevers >100.4 unrelieved by medication, shortness of breath, intractable vomiting, chest pain, shortness of breath, weakness, numbness, changes in speech, facial asymmetry, abdominal pain, passing out, Inability to tolerate liquids or food, cough, altered mental status or any concerns. No signs of systemic illness or infection. The patient is nontoxic-appearing on exam and vital signs are within normal limits.  I have reviewed the triage vital  signs and the nursing notes. Pertinent labs & imaging results that were available during my care of the patient were reviewed by me and considered in my medical decision making (see chart for details). After history, exam, and medical workup I feel the patient has been appropriately medically screened and is safe for discharge home. Pertinent diagnoses were discussed with the patient. Patient was given return precautions.    Ruth Kovich, MD 06/26/20 CR:[id number]

## 2020-06-26 NOTE — ED Notes (Signed)
Patient transported to CT 

## 2020-06-26 NOTE — ED Notes (Signed)
Sister [name] [phone] would like an update when possible

## 2020-06-26 NOTE — ED Notes (Signed)
Discharge papers reviewed with pt. No questions at this time. Pt verbalized understanding. Pt was provided with cab voucher. Address on file was confirmed by pt. Cab is about 64mns out. Pt placed in lobby, staff made aware she is waiting for a cab.

## 2020-06-26 NOTE — ED Provider Notes (Signed)
[facility] EMERGENCY DEPARTMENT Provider Note   CSN: [id number] Arrival date & time: 06/26/20  1812     History Chief Complaint  Patient presents with  . Fall  . Laceration  . Nausea    Unwitnessed fall, friend found her on the floor leaned up against a recliner, pt does not remember fall, EMS states that it appeared that fall happened in kitchen about 20 feet from wear blood was discovered    [name] is a 85 y.o. female.  The history is provided by the patient, the EMS personnel and medical records.  Fall  Laceration  [name] is a 85 y.o. female who presents to the Emergency Department complaining of fall. Level V caveat due to confusion. She presents the emergency department as a level II trauma alert following fall on thinners. She lives at home alone. Her friend came to check on her today she was found in the living room sitting on the floor in front of the couch. EMS found her glasses broken about 15 feet away. Patient complains of dizziness, nausea. She stopped falling.     Past Medical History:  Diagnosis Date  . Anxiety   . Arthritis   . Atrial fibrillation ([place])   . B12 deficiency   . COPD (chronic obstructive pulmonary disease) ([place])   . Depression   . GERD (gastroesophageal reflux disease)   . Hypertension   . Hypoxia with need for home oxygen  03/14/2020  . Mural thrombus of left atrium 03/09/20 03/14/2020  . Osteoarthrosis, hand    both hands  . Peptic ulcer, unspecified site, unspecified as acute or chronic, without mention of hemorrhage, perforation, or obstruction   . PONV (postoperative nausea and vomiting)   . Vitamin D deficiency disease     Patient Active Problem List   Diagnosis Date Noted  . Vision changes 06/14/2020  . Mural thrombus of left atrium 03/09/20 03/14/2020  . Hypoxia with need for home oxygen  03/14/2020  . Acute combined systolic and diastolic heart failure ([place]) 03/08/2020  . Stomatitis 01/21/2020  .  Edema 12/03/2019  . AKI (acute kidney injury) ([place])   . Acute diastolic CHF (congestive heart failure) ([place])   . Hypokalemia 09/15/2019  . CKD (chronic kidney disease), stage III ([place]) 09/15/2019  . Elevated troponin 09/15/2019  . Hypertensive emergency 09/15/2019  . Dyslipidemia, goal LDL below 70 08/27/2019  . Acute ST elevation myocardial infarction (STEMI) of lateral wall ([place]) 08/26/2019  . Heart attack ([place]) 08/26/2019  . Ankle pain, left 07/23/2019  . Intertrigo 02/05/2019  . Thrush 01/10/2019  . Shingles 09/16/2018  . CAD S/P percutaneous coronary angioplasty 08/2019 05/06/2018  . Esophageal dysmotility 01/11/2018  . Weight gain 01/01/2018  . Abdominal bloating 01/01/2018  . Hyperkalemia 06/04/2017  . Grief at loss of child 04/02/2017  . Adenopathy, cervical 11/29/2016  . Dysuria 10/31/2016  . Vertigo 10/31/2016  . Ureteral calculus 09/11/2016  . CAP (community acquired pneumonia) 08/30/2016  . Fatigue 08/24/2016  . Sensation of pressure in bladder area 08/24/2016  . Kidney stone 08/13/2016  . Flank pain 08/10/2016  . Right upper quadrant abdominal tenderness without rebound tenderness 08/10/2016  . Leukocytosis 08/10/2016  . Acute abdomen 08/10/2016  . Intractable nausea and vomiting 08/10/2016  . ARF (acute renal failure) ([place]) 08/10/2016  . Urolithiasis 08/10/2016  . Abdominal pain 08/10/2016  . Anemia 05/19/2016  . Generalized anxiety disorder 01/14/2016  . Dysphagia 04/16/2015  . Dry skin dermatitis 12/28/2014  .  Atrophic vaginitis 05/26/2014  . Varicose veins of both lower extremities 12/12/2013  . Restless leg syndrome 03/17/2013  . Impacted cerumen of left ear 03/17/2013  . Osteoarthritis, hand 09/05/2011  . Epistaxis, recurrent 08/17/2011  . Well adult exam 01/31/2011  . Low back pain 01/31/2011  . Palpitations 05/24/2010  . Cerumen impaction 02/23/2010  . PHARYNGITIS, ACUTE 02/23/2010  . SINUSITIS, ACUTE 01/07/2010  . SOB (shortness of breath)  09/23/2009  . BRONCHITIS, ACUTE 08/03/2009  . Osteoarthritis 07/06/2009  . TOBACCO USE, QUIT 03/08/2009  . ALLERGIC RHINITIS 11/06/2008  . NAUSEA ALONE 08/19/2008  . DIARRHEA 08/19/2008  . Benign essential HTN 07/15/2008  . EMPHYSEMA 07/15/2008  . Peptic ulcer 07/15/2008  . ARTHRITIS 07/15/2008  . HEADACHE 05/25/2008  . SYNCOPE 03/23/2008  . WEIGHT LOSS, ABNORMAL 03/23/2008  . HEARING IMPAIRMENT 12/23/2007  . B12 deficiency 10/14/2007  . Malaise 10/14/2007  . INSOMNIA, PERSISTENT 07/31/2007  . Anxiety state 03/01/2007  . DEPRESSION 03/01/2007  . Persistent atrial fibrillation ([place]) 02/26/2007  . Vitamin D deficiency 02/19/2007  . GERD 02/19/2007  . COPD with chronic bronchitis ([place]) 08/07/2006    Past Surgical History:  Procedure Laterality Date  . BLADDER SURGERY     Bladder tack and intestines  . CARDIOVERSION N/A 05/11/2020   Procedure: CARDIOVERSION;  Surgeon: Lelon Perla, MD;  Location: [facility] ENDOSCOPY;  Service: Cardiovascular;  Laterality: N/A;  . CORONARY/GRAFT ACUTE MI REVASCULARIZATION N/A 08/26/2019   Procedure: Coronary/Graft Acute MI Revascularization;  Surgeon: Sherren Mocha, MD;  Location: [facility] CV LAB;  Service: Cardiovascular;  Laterality: N/A;  . CYSTOCELE REPAIR    . CYSTOSCOPY WITH RETROGRADE PYELOGRAM, URETEROSCOPY AND STENT PLACEMENT Right 09/11/2016   Procedure: CYSTOSCOPY WITH RETROGRADE PYELOGRAM, URETEROSCOPY AND STENT REPLACEMENT;  Surgeon: Cleon Gustin, MD;  Location: [facility] ORS;  Service: Urology;  Laterality: Right;  . CYSTOSCOPY WITH STENT PLACEMENT Right 08/11/2016   Procedure: CYSTOSCOPY, URETERSCOPY,  RIGHT RETROGRADE WITH RIGHT STENT PLACEMENT;  Surgeon: Festus Aloe, MD;  Location: [facility] ORS;  Service: Urology;  Laterality: Right;  . SPLENECTOMY    . TEE WITHOUT CARDIOVERSION N/A 03/10/2020   Procedure: TRANSESOPHAGEAL ECHOCARDIOGRAM (TEE);  Surgeon: Josue Hector, MD;  Location: [facility] ENDOSCOPY;  Service: Cardiovascular;  Laterality:  N/A;  . TEE WITHOUT CARDIOVERSION N/A 05/11/2020   Procedure: TRANSESOPHAGEAL ECHOCARDIOGRAM (TEE);  Surgeon: Lelon Perla, MD;  Location: [facility] ENDOSCOPY;  Service: Cardiovascular;  Laterality: N/A;     OB History   No obstetric history on file.     Family History  Problem Relation Age of Onset  . Heart disease Father   . Arthritis Other        Family history of  . Coronary artery disease Other        Family history of  . Hypertension Other        Family history of  . Diabetes Daughter        78 died    Social History   Tobacco Use  . Smoking status: Former Smoker    Packs/day: 1.00    Types: Cigarettes    Quit date: 2010    Years since quitting: 12.3  . Smokeless tobacco: Never Used  . Tobacco comment: Pt unsure how many years she has been smoking   Vaping Use  . Vaping Use: Never used  Substance Use Topics  . Alcohol use: No  . Drug use: No    Home Medications Prior to Admission medications   Medication Sig Start Date End Date Taking?  Authorizing Provider  acetaminophen (TYLENOL) 325 MG tablet Take 2 tablets (650 mg total) by mouth every 4 (four) hours as needed for headache or mild pain. Patient not taking: Reported on 06/24/2020 03/14/20   Isaiah Serge, NP  amiodarone (PACERONE) 200 MG tablet Take 1 tablet (200 mg total) by mouth daily. Patient taking differently: Take 100 mg by mouth 2 (two) times daily. 04/27/20   Almyra Deforest, PA  apixaban (ELIQUIS) 2.5 MG TABS tablet Take 1 tablet (2.5 mg total) by mouth 2 (two) times daily. 03/25/20   Lelon Perla, MD  Biotin 1000 MCG tablet Take 1,000 mcg by mouth daily. Patient not taking: Reported on 06/24/2020    [provider]  Cholecalciferol 25 MCG (1000 UT) capsule Take 1,000 Units by mouth daily.    [provider]  clonazePAM (KLONOPIN) 0.5 MG tablet Take 1 tablet (0.5 mg total) by mouth at bedtime as needed (leg cramps). TAKE 1 TABLET BY MOUTH AT BEDTIME AS NEEDED FOR LEG CRAMPS 03/14/20    Isaiah Serge, NP  cyanocobalamin 1000 MCG tablet Take 1,000 mcg by mouth daily. Patient not taking: Reported on 06/24/2020    [provider]  docusate sodium (COLACE) 100 MG capsule Take 1 capsule (100 mg total) by mouth daily as needed for mild constipation. 03/14/20   Isaiah Serge, NP  Fluticasone-Umeclidin-Vilant (TRELEGY ELLIPTA) 100-62.5-25 MCG/INH AEPB Inhale 1 Inhaler into the lungs daily. 12/03/19   Plotnikov, Evie Lacks, MD  hydrALAZINE (APRESOLINE) 50 MG tablet Take 1 tablet (50 mg total) by mouth 3 (three) times daily. 06/25/20 09/23/20  Minus Breeding, MD  hydrocerin (EUCERIN) CREA Apply 1 application topically 2 (two) times daily. Patient not taking: Reported on 06/24/2020 03/14/20   Isaiah Serge, NP  isosorbide mononitrate (IMDUR) 30 MG 24 hr tablet Take 1 tablet (30 mg total) by mouth daily. 06/25/20   Minus Breeding, MD  ketoconazole (NIZORAL) 2 % cream APPLY TOPICALLY TO THE AFFECTED AREA DAILY Patient not taking: Reported on 06/24/2020 08/11/19   Plotnikov, Evie Lacks, MD  levalbuterol [facility] HFA) 45 MCG/ACT inhaler Inhale 2 puffs into the lungs every 4 (four) hours as needed for wheezing or shortness of breath. 04/16/20   Margaretha Seeds, MD  levothyroxine (SYNTHROID) 25 MCG tablet Take 25 mcg by mouth every morning. 03/30/20   [provider]  megestrol (MEGACE) 400 MG/10ML suspension Take 5 mLs (200 mg total) by mouth 2 (two) times daily. 06/14/20 07/14/20  Plotnikov, Evie Lacks, MD  metoprolol tartrate (LOPRESSOR) 25 MG tablet Take 1 tablet (25 mg total) by mouth 2 (two) times daily. 06/22/20 09/20/20  Lelon Perla, MD  nitroGLYCERIN (NITROSTAT) 0.4 MG SL tablet Place 1 tablet (0.4 mg total) under the tongue every 5 (five) minutes as needed for chest pain. 08/28/19 08/27/20  Kathyrn Drown D, NP  nystatin (MYCOSTATIN) 100000 UNIT/ML suspension Take 5 mLs (500,000 Units total) by mouth 4 (four) times daily. Patient not taking: Reported on 06/24/2020 03/15/20    Hoyt Koch, MD  pantoprazole (PROTONIX) 40 MG tablet Take 1 tablet (40 mg total) by mouth daily. 03/25/20   Lelon Perla, MD  potassium chloride (KLOR-CON) 10 MEQ tablet Take 1 tablet (10 mEq total) by mouth daily. Patient taking differently: Take 10 mEq by mouth 2 (two) times daily. 03/25/20   Lelon Perla, MD  Propylene Glycol (SYSTANE BALANCE) 0.6 % SOLN Place 1 drop into both eyes 2 (two) times daily as needed (dry eyes).  [provider]  rosuvastatin (CRESTOR) 10 MG tablet Take 1 tablet (10 mg total) by mouth daily. 03/25/20   Lelon Perla, MD  torsemide (DEMADEX) 20 MG tablet Take 40 mg (2 tablets) in the morning and 20 mg (one tablet) in the evening 06/25/20   Minus Breeding, MD    Allergies    Amoxicillin, Codeine, Levaquin [levofloxacin in d5w], and Other  Review of Systems   Review of Systems  All other systems reviewed and are negative.   Physical Exam Updated Vital Signs BP 128/78   Pulse 70   Temp 97.8 F (36.6 C)   Resp 18   Ht '5\' 4"'$  (1.626 m)   Wt 44 kg   SpO2 97%   BMI 16.65 kg/m   Physical Exam Vitals and nursing note reviewed.  Constitutional:      Appearance: She is well-developed.  HENT:     Head: Normocephalic.     Comments: Large hematoma to the right temple. There is a laceration over the right eyebrow. Eyes:     Extraocular Movements: Extraocular movements intact.     Pupils: Pupils are equal, round, and reactive to light.  Cardiovascular:     Rate and Rhythm: Normal rate and regular rhythm.     Heart sounds: No murmur heard.   Pulmonary:     Effort: Pulmonary effort is normal. No respiratory distress.     Breath sounds: Normal breath sounds.  Abdominal:     Palpations: Abdomen is soft.     Tenderness: There is no abdominal tenderness. There is no guarding or rebound.  Musculoskeletal:        General: No tenderness.     Comments: Pitting edema to bilateral lower extremities  Skin:    General: Skin is  warm and dry.  Neurological:     Mental Status: She is alert.     Comments: Oriented to person and place. Moves all extremities symmetrically. Slow to answer questions.  Psychiatric:        Behavior: Behavior normal.     ED Results / Procedures / Treatments   Labs (all labs ordered are listed, but only abnormal results are displayed) Labs Reviewed  COMPREHENSIVE METABOLIC PANEL - Abnormal; Notable for the following components:      Result Value   Sodium 134 (*)    Chloride 95 (*)    Glucose, Bld 127 (*)    BUN 29 (*)    Creatinine, Ser 1.92 (*)    Albumin 3.2 (*)    AST 71 (*)    GFR, Estimated 26 (*)    All other components within normal limits  CBC WITH DIFFERENTIAL/PLATELET - Abnormal; Notable for the following components:   WBC 15.4 (*)    RDW 23.2 (*)    Neutro Abs 13.2 (*)    Monocytes Absolute 1.3 (*)    Abs Immature Granulocytes 0.10 (*)    All other components within normal limits  CBG MONITORING, ED - Abnormal; Notable for the following components:   Glucose-Capillary 127 (*)    All other components within normal limits  TROPONIN I (HIGH SENSITIVITY) - Abnormal; Notable for the following components:   Troponin I (High Sensitivity) 146 (*)    All other components within normal limits  SARS CORONAVIRUS 2 (TAT 6-24 HRS)  PROTIME-INR  URINALYSIS, ROUTINE W REFLEX MICROSCOPIC  TYPE AND SCREEN  TROPONIN I (HIGH SENSITIVITY)    EKG None  Radiology CT Head Wo Contrast  Result Date: 06/26/2020 CLINICAL DATA:  85 year old post fall on blood thinners. Struck head with right forehead bruising. EXAM: CT HEAD WITHOUT CONTRAST TECHNIQUE: Contiguous axial images were obtained from the base of the skull through the vertex without intravenous contrast. COMPARISON:  Head CT 09/15/2019 FINDINGS: Brain: No intracranial hemorrhage, mass effect, or midline shift. Normal for age atrophy. No hydrocephalus. The basilar cisterns are patent. No evidence of territorial infarct or acute  ischemia. No extra-axial or intracranial fluid collection. Vascular: Atherosclerosis of skullbase vasculature without hyperdense vessel or abnormal calcification. Skull: No fracture or focal lesion. Sinuses/Orbits: No acute fracture. Chronic opacification of right mastoid air cells. Bilateral cataract resection. Other: Large right frontal scalp hematoma. IMPRESSION: Large right frontal scalp hematoma. No acute intracranial abnormality. No skull fracture. Electronically Signed   By: Keith Rake M.D.   On: 06/26/2020 19:25   CT Cervical Spine Wo Contrast  Result Date: 06/26/2020 CLINICAL DATA:  Neck trauma.  Fall striking head. EXAM: CT CERVICAL SPINE WITHOUT CONTRAST TECHNIQUE: Multidetector CT imaging of the cervical spine was performed without intravenous contrast. Multiplanar CT image reconstructions were also generated. COMPARISON:  None. FINDINGS: Alignment: No traumatic subluxation. Trace degenerative anterolisthesis of C5 on C6. Mild broad-based dextroscoliotic curvature. Skull base and vertebrae: No acute fracture. Vertebral body heights are maintained. The dens and skull base are intact. Occasional lucencies in the cervical spine are felt to be related to underlying osteopenia/osteoporosis. Bone island within T4 vertebral body. Soft tissues and spinal canal: No prevertebral fluid or swelling. No visible canal hematoma. Disc levels: Multilevel degenerative disc disease, most prominently affecting C6-C7. Multilevel facet hypertrophy. Upper chest: Biapical pleuroparenchymal scarring. Emphysema. No acute apical findings. Other: None. IMPRESSION: 1. No acute fracture or subluxation of the cervical spine. 2. Multilevel degenerative disc disease and facet hypertrophy. Electronically Signed   By: Keith Rake M.D.   On: 06/26/2020 19:28   DG Chest Port 1 View  Result Date: 06/26/2020 CLINICAL DATA:  Fall with nausea, back pain. EXAM: PORTABLE CHEST 1 VIEW COMPARISON:  None. FINDINGS: The heart is  enlarged. Vascular calcifications are seen in the aortic arch. The costophrenic angles are blunted, likely reflecting small pleural effusions with associated atelectasis. No pneumothorax. Degenerative changes are seen in the spine. IMPRESSION: 1. Blunted costophrenic angles likely reflects small bilateral pleural effusions with associated atelectasis. 2. Cardiomegaly. Electronically Signed   By: Zerita Boers M.D.   On: 06/26/2020 18:46    Procedures Procedures   Medications Ordered in ED Medications  ondansetron (ZOFRAN) injection 4 mg (4 mg Intravenous Given 06/26/20 1840)  lidocaine-EPINEPHrine (XYLOCAINE W/EPI) 2 %-1:200000 (PF) injection 10 mL (10 mLs Infiltration Given 06/26/20 2033)    ED Course  I have reviewed the triage vital signs and the nursing notes.  Pertinent labs & imaging results that were available during my care of the patient were reviewed by me and considered in my medical decision making (see chart for details).    MDM Rules/Calculators/A&P                         patient here is level II trauma alert following fall on thinners. Patient is confused, vertiginous on examination with frequent retching. Imaging is negative for serious intracranial abnormality. Given syncopal event at home with head injury, concussive symptoms in the emergency department plan to admit for ongoing observation.Marland Kitchen Hospitalist consulted for observation.  Final Clinical Impression(s) / ED Diagnoses Final diagnoses:  Fall, initial encounter  Contusion of face, initial encounter  Vertigo  Rx / DC Orders ED Discharge Orders    None       Quintella Reichert, MD 06/26/20 2034

## 2020-06-26 NOTE — ED Notes (Signed)
Pt in CT. EDP and pharmacy with pt along with 2 TRNs

## 2020-06-26 NOTE — ED Triage Notes (Signed)
Unwitnessed fall, apparent LOC

## 2020-06-27 ENCOUNTER — Observation Stay (HOSPITAL_BASED_OUTPATIENT_CLINIC_OR_DEPARTMENT_OTHER): Payer: Medicare Other

## 2020-06-27 DIAGNOSIS — I5042 Chronic combined systolic (congestive) and diastolic (congestive) heart failure: Secondary | ICD-10-CM | POA: Diagnosis not present

## 2020-06-27 DIAGNOSIS — Z66 Do not resuscitate: Secondary | ICD-10-CM | POA: Diagnosis not present

## 2020-06-27 DIAGNOSIS — I340 Nonrheumatic mitral (valve) insufficiency: Secondary | ICD-10-CM | POA: Diagnosis not present

## 2020-06-27 DIAGNOSIS — Z515 Encounter for palliative care: Secondary | ICD-10-CM | POA: Diagnosis not present

## 2020-06-27 DIAGNOSIS — I361 Nonrheumatic tricuspid (valve) insufficiency: Secondary | ICD-10-CM

## 2020-06-27 DIAGNOSIS — I10 Essential (primary) hypertension: Secondary | ICD-10-CM

## 2020-06-27 DIAGNOSIS — Z7189 Other specified counseling: Secondary | ICD-10-CM | POA: Diagnosis not present

## 2020-06-27 DIAGNOSIS — R55 Syncope and collapse: Secondary | ICD-10-CM | POA: Diagnosis not present

## 2020-06-27 LAB — ECHOCARDIOGRAM COMPLETE
AR max vel: 3.4 cm2
AV Area VTI: 3.32 cm2
AV Area mean vel: 3.29 cm2
AV Mean grad: 1 mmHg
AV Peak grad: 1.4 mmHg
Ao pk vel: 0.59 m/s
Area-P 1/2: 3.74 cm2
Calc EF: 56.4 %
Height: 64 in
S' Lateral: 2.65 cm
Single Plane A2C EF: 56.8 %
Single Plane A4C EF: 56.3 %
Weight: 1580.26 oz

## 2020-06-27 LAB — CBC
HCT: 31.2 % — ABNORMAL LOW (ref 36.0–46.0)
Hemoglobin: 10 g/dL — ABNORMAL LOW (ref 12.0–15.0)
MCH: 26.8 pg (ref 26.0–34.0)
MCHC: 32.1 g/dL (ref 30.0–36.0)
MCV: 83.6 fL (ref 80.0–100.0)
Platelets: 229 10*3/uL (ref 150–400)
RBC: 3.73 MIL/uL — ABNORMAL LOW (ref 3.87–5.11)
RDW: 22.4 % — ABNORMAL HIGH (ref 11.5–15.5)
WBC: 11.1 10*3/uL — ABNORMAL HIGH (ref 4.0–10.5)
nRBC: 0 % (ref 0.0–0.2)

## 2020-06-27 LAB — COMPREHENSIVE METABOLIC PANEL
ALT: 23 U/L (ref 0–44)
AST: 47 U/L — ABNORMAL HIGH (ref 15–41)
Albumin: 2.5 g/dL — ABNORMAL LOW (ref 3.5–5.0)
Alkaline Phosphatase: 44 U/L (ref 38–126)
Anion gap: 11 (ref 5–15)
BUN: 28 mg/dL — ABNORMAL HIGH (ref 8–23)
CO2: 27 mmol/L (ref 22–32)
Calcium: 8.7 mg/dL — ABNORMAL LOW (ref 8.9–10.3)
Chloride: 98 mmol/L (ref 98–111)
Creatinine, Ser: 2.01 mg/dL — ABNORMAL HIGH (ref 0.44–1.00)
GFR, Estimated: 24 mL/min — ABNORMAL LOW (ref >60)
Glucose, Bld: 75 mg/dL (ref 70–99)
Potassium: 3.7 mmol/L (ref 3.5–5.1)
Sodium: 136 mmol/L (ref 135–145)
Total Bilirubin: 1 mg/dL (ref 0.3–1.2)
Total Protein: 5.1 g/dL — ABNORMAL LOW (ref 6.5–8.1)

## 2020-06-27 LAB — SARS CORONAVIRUS 2 (TAT 6-24 HRS): SARS Coronavirus 2: NEGATIVE

## 2020-06-27 NOTE — Evaluation (Signed)
Physical Therapy Evaluation Patient Details Name: [name] MRN: [id number] DOB: Aug 02, 1935 Today's Date: 06/27/2020   History of Present Illness  85 y.o. female presents to [facility] ED on 06/26/2020 with unwitnessed fall at home. All imaging negative. Admitted for workup of possible syncopal episode and AMS. PMH includes abdominal pain, heart failure, mural thrombus, CAD status post MI and stent, anxiety, depression, hypertension, CKD 3, COPD, esophageal dysmotility, GERD, hearing loss, A. fib, RLS.  Clinical Impression  Pt reports she is unsure of the circumstances leading to her fall. Pt on 3 LPM [place] upon arrival, reporting that she does not wear oxygen at home. Pt desats without use of oxygen. Pt requires physical assistance for bed mobility, transfers, and gait at this time. Pt demonstrates ability to ambulate household distances, but is limited due to weakness and pain. Pt demonstrates balance deficits, but requires multiple cues for managing RW. Pt demonstrates confusion throughout session, impairments in memory, and reduced awareness of deficits.Pt will benefit from acute PT to address deficits in LE strength, endurance, power, and activity tolerance to improve safety and mobility. SPT recommends SNF placement as pt requires physical assistance for all OOB mobility and has limited caregiver support.  Blood pressure at rest: 170/73 Blood pressure seated: 159/58 Blood pressure standing: 151/57 Blood pressure standing for 3 min: 143/74, pt experiences transient dizziness immediately following second standing BP. Difficult to monitor symptoms d/t cognitive deficits.  Blood pressure following gait: 136/66  Follow Up Recommendations SNF (Pt may refuse SNF placement. If pt refuses and returns home, pt will benefit from HHPT and will require 24 hour supervision and min A for all OOB mobility.)    Equipment Recommendations  Rolling walker with 5" wheels    Recommendations for Other Services        Precautions / Restrictions Precautions Precautions: Fall;Other (comment) (monitor BP) Precaution Comments: L eye poor vision, R eye blurred vision. Restrictions Weight Bearing Restrictions: No      Mobility  Bed Mobility Overal bed mobility: Needs Assistance Bed Mobility: Supine to Sit     Supine to sit: Min assist     General bed mobility comments: Increased time, HOB elevated, requires min A to come to sit at EOB.    Transfers Overall transfer level: Needs assistance Equipment used: Rolling walker (2 wheeled) Transfers: Sit to/from [facility] Sit to Stand: Min assist Stand pivot transfers: Min assist      Lateral/Scoot Transfers: Min guard General transfer comment: Pt requires multiple verbal, tactile, and manual cues for trunk flexion and foot placement with sit > stand,  min A to power to stand.  Ambulation/Gait Ambulation/Gait assistance: Min guard Gait Distance (Feet): 25 Feet Assistive device: Rolling walker (2 wheeled) Gait Pattern/deviations: Step-to pattern;Decreased stride length Gait velocity: reduced Gait velocity interpretation: <1.31 ft/sec, indicative of household ambulator General Gait Details: Pt picks RW up and requires VC to push RW on ground and walk towards walker.  Stairs            Wheelchair Mobility    Modified Rankin (Stroke Patients Only)       Balance Overall balance assessment: Needs assistance;History of Falls Sitting-balance support: Single extremity supported;Bilateral upper extremity supported;Feet supported Sitting balance-Leahy Scale: Poor     Standing balance support: During functional activity;Bilateral upper extremity supported Standing balance-Leahy Scale: Poor Standing balance comment: slighty unsteady  Pertinent Vitals/Pain Pain Assessment: 0-10 Pain Score: 5  Pain Location: R face Pain Descriptors / Indicators: Grimacing;Guarding Pain  Intervention(s): Monitored during session    Home Living Family/patient expects to be discharged to:: Private residence Living Arrangements: Alone Available Help at Discharge: Family;Available PRN/intermittently (Sister) Type of Home: Apartment Home Access: Level entry     Home Layout: One level Home Equipment: Walker - 2 wheels;Cane - single point      Prior Function Level of Independence: Independent         Comments: Pt reports she is independent in ADLs, including cooking. Pt does not currently use an AD. Sister helps with delivering groceries.     Hand Dominance   Dominant Hand: Right    Extremity/Trunk Assessment   Upper Extremity Assessment Upper Extremity Assessment: Defer to OT evaluation    Lower Extremity Assessment Lower Extremity Assessment: Generalized weakness    Cervical / Trunk Assessment Cervical / Trunk Assessment: Kyphotic  Communication   Communication: HOH  Cognition Arousal/Alertness: Awake/alert Behavior During Therapy: WFL for tasks assessed/performed Overall Cognitive Status: No family/caregiver present to determine baseline cognitive functioning                                 General Comments: Pt demonstrated confusion during session. Pt agreeable to sit in recliner for lunch then asks to be returned to her bed for lunch, reporting that she is unable to reach her plate. Pt places hand on top of dish cover while verbalizing inability to reach it. By end of session, after transfered to bed upon pt request, pt reports nobody will help her get to her recliner.      General Comments General comments (skin integrity, edema, etc.): Pt on 3LPM upon arrival, weaned to RA as pt reports she does not typically wear oxygen at home. Pt desats to lower 80s. Pt returned to 3LPM, with pt's sats returning to > 92%.    Exercises     Assessment/Plan    PT Assessment Patient needs continued PT services  PT Problem List Decreased  strength;Decreased activity tolerance;Decreased balance;Decreased mobility;Decreased cognition;Decreased knowledge of use of DME;Decreased safety awareness;Decreased knowledge of precautions;Cardiopulmonary status limiting activity;Pain       PT Treatment Interventions DME instruction;Gait training;Functional mobility training;Therapeutic activities;Therapeutic exercise;Balance training;Cognitive remediation    PT Goals (Current goals can be found in the Care Plan section)  Acute Rehab PT Goals Patient Stated Goal: return home PT Goal Formulation: With patient Time For Goal Achievement: 07/11/20 Potential to Achieve Goals: Fair    Frequency Min 2X/week   Barriers to discharge Decreased caregiver support      Co-evaluation               AM-PAC PT "6 Clicks" Mobility  Outcome Measure Help needed turning from your back to your side while in a flat bed without using bedrails?: A Little Help needed moving from lying on your back to sitting on the side of a flat bed without using bedrails?: A Little Help needed moving to and from a bed to a chair (including a wheelchair)?: A Little Help needed standing up from a chair using your arms (e.g., wheelchair or bedside chair)?: A Little Help needed to walk in hospital room?: A Little Help needed climbing 3-5 steps with a railing? : A Lot 6 Click Score: 17    End of Session Equipment Utilized During Treatment: Gait belt;Oxygen Activity Tolerance: Patient limited by  fatigue;Patient limited by pain Patient left: in bed;with call bell/phone within reach;with bed alarm set Nurse Communication: Mobility status PT Visit Diagnosis: Unsteadiness on feet (R26.81);Other abnormalities of gait and mobility (R26.89);Repeated falls (R29.6);Muscle weakness (generalized) (M62.81);History of falling (Z91.81);Difficulty in walking, not elsewhere classified (R26.2);Dizziness and giddiness (R42);Pain Pain - Right/Left: Right Pain - part of body:  (face)     Time: [id number] PT Time Calculation (min) (ACUTE ONLY): 62 min   Charges:   PT Evaluation $PT Eval Low Complexity: 1 Low PT Treatments $Gait Training: 8-22 mins $Therapeutic Activity: 8-22 mins        Acute Rehab  Pager: [phone]   Garwin Brothers, SPT  06/27/2020, 1:51 PM

## 2020-06-27 NOTE — Progress Notes (Signed)
PROGRESS NOTE    [name]  [id number] DOB: 05/04/1935 DOA: 06/26/2020 PCP: Cassandria Anger, MD    Chief Complaint  Patient presents with  . Fall  . Laceration  . Nausea    Unwitnessed fall, friend found her on the floor leaned up against a recliner, pt does not remember fall, EMS states that it appeared that fall happened in kitchen about 20 feet from wear blood was discovered    Brief Narrative:    [name] is a 85 y.o. female with medical history significant of abdominal pain, heart failure, mural thrombus, CAD status post MI and stent, anxiety, depression, hypertension, CKD 3, COPD, esophageal dysmotility, GERD, hearing loss, A. fib, RLS who presents following a fall at home.  CT head which showed large right frontal scalp hematoma but no acute intracranial abnormality and no fracture.  CT C-spine which showed no acute abnormality but did demonstrate chronic DDD.     Assessment & Plan:   Principal Problem:   SYNCOPE Active Problems:   Benign essential HTN   Persistent atrial fibrillation (HCC)   COPD with chronic bronchitis (HCC)   GERD   CAD S/P percutaneous coronary angioplasty 08/2019   Dyslipidemia, goal LDL below 70   CKD (chronic kidney disease), stage III (HCC)   Chronic combined systolic and diastolic heart failure (HCC)   Mural thrombus of left atrium 03/09/20   Concussion   Hematoma of frontal scalp   Fall at home with syncope and  right scalp hematoma: - possibly cardiogenic.  Orthostatic vital signs pending.  - telemonitor showing chronic atrial fibrillation.  PT eval recommending SNF/ 24 hour supervision.  - echocardiogram ordered.    Leukocytosis; evaluate for UTI.  UA Pending.    CAD s/p Stent placement.  Continue with aspirin .    Elevated troponin/ demand ischemia:  Pt denies any chest pain.     acute on Stage 3 b CKD:  - creatinine at baseline ranging between 1.4 to 2. . d/c torsemide.          Hypothyroidism:   Resume synthroid.     Hypertension;  Sub optimally controlled.    Chronic atrial fibrillation:  Rate controlled.  Anticoagulation on hold for scalp hematoma.  In view of her co morbidities, falls, deconditioning, debility, palliative care consulted for goals of care.    DVT prophylaxis: SCD'S Code Status: DNR Family Communication: none at bedside,  Disposition:   Status is: Observation  The patient will require care spanning > 2 midnights and should be moved to inpatient because: Ongoing diagnostic testing needed not appropriate for outpatient work up and Unsafe d/c plan  Dispo: The patient is from: Home              Anticipated d/c is to: ALF              Patient currently is not medically stable to d/c.   Difficult to place patient No       Consultants:   None.   Procedures: echocardiogram.  Antimicrobials: none.   Subjective: Slight headache.   Objective: Vitals:   06/27/20 0045 06/27/20 0400 06/27/20 0834 06/27/20 1114  BP: (!) 159/55 (!) 147/70 (!) 136/55 (!) 161/59  Pulse: 64 62 62 61  Resp: '18 18 11 18  '$ Temp:  98.9 F (37.2 C) 98.3 F (36.8 C) 97.8 F (36.6 C)  TempSrc:  Oral Oral Oral  SpO2: 95% 95% 95% 98%  Weight:  44.8 kg  Height:        Intake/Output Summary (Last 24 hours) at 06/27/2020 1134 Last data filed at 06/27/2020 0900 Gross per 24 hour  Intake 110 ml  Output --  Net 110 ml   Filed Weights   06/26/20 1920 06/27/20 0400  Weight: 44 kg 44.8 kg    Examination:  General exam: elderly frail lady, not in distress. Scalp hematoma.  Respiratory system: Clear to auscultation. Respiratory effort normal. Cardiovascular system: S1 & S2 heard,irregularly irregular.  No JVD,. No pedal edema. Gastrointestinal system: Abdomen is nondistended, soft and non tender.  Normal bowel sounds heard. Central nervous system: Alert and oriented. No focal neurological deficits. Extremities: no pedal edema.  Skin: No rashes, lesions or  ulcers Psychiatry:  Mood & affect appropriate.     Data Reviewed: I have personally reviewed following labs and imaging studies  CBC: Recent Labs  Lab 06/23/20 2308 06/26/20 0004 06/26/20 1830 06/27/20 0556  WBC 10.0 10.9* 15.4* 11.1*  NEUTROABS 6.7 7.9* 13.2*  --   HGB 12.5 12.3 12.5 10.0*  HCT 39.0 38.0 39.0 31.2*  MCV 83.3 83.0 83.3 83.6  PLT 294 325 273 [id number]    Basic Metabolic Panel: Recent Labs  Lab 06/23/20 2308 06/25/20 1407 06/26/20 0004 06/26/20 1830 06/26/20 2133 06/27/20 0556  NA 138 139 132* 134*  --  136  K 2.8* 4.5 4.1 4.7  --  3.7  CL 92* 100 93* 95*  --  98  CO2 36* '22 28 28  '$ --  27  GLUCOSE 82 103* 91 127*  --  75  BUN 29* 18 28* 29*  --  28*  CREATININE 1.98* 1.44* 1.96* 1.92*  --  2.01*  CALCIUM 9.1 9.3 9.5 9.6  --  8.7*  MG  --   --   --   --  1.8  --     GFR: Estimated Creatinine Clearance: 15.3 mL/min (A) (by C-G formula based on SCr of 2.01 mg/dL (H)).  Liver Function Tests: Recent Labs  Lab 06/26/20 1830 06/27/20 0556  AST 71* 47*  ALT 28 23  ALKPHOS 56 44  BILITOT 0.7 1.0  PROT 6.6 5.1*  ALBUMIN 3.2* 2.5*    CBG: Recent Labs  Lab 06/26/20 1831  GLUCAP 127*     Recent Results (from the past 240 hour(s))  SARS CORONAVIRUS 2 (TAT 6-24 HRS) Nasopharyngeal Nasopharyngeal Swab     Status: None   Collection Time: 06/26/20  9:33 PM   Specimen: Nasopharyngeal Swab  Result Value Ref Range Status   SARS Coronavirus 2 NEGATIVE NEGATIVE Final    Comment: (NOTE) SARS-CoV-2 target nucleic acids are NOT DETECTED.  The SARS-CoV-2 RNA is generally detectable in upper and lower respiratory specimens during the acute phase of infection. Negative results do not preclude SARS-CoV-2 infection, do not rule out co-infections with other pathogens, and should not be used as the sole basis for treatment or other patient management decisions. Negative results must be combined with clinical observations, patient history, and epidemiological  information. The expected result is Negative.  Fact Sheet for Patients: SugarRoll.be  Fact Sheet for Healthcare Providers: https://www.woods-mathews.com/  This test is not yet approved or cleared by the [place] FDA and  has been authorized for detection and/or diagnosis of SARS-CoV-2 by FDA under an Emergency Use Authorization (EUA). This EUA will remain  in effect (meaning this test can be used) for the duration of the COVID-19 declaration under Se ction 564(b)(1) of the Act, 21 U.S.C. section 360bbb-3(b)(1),  unless the authorization is terminated or revoked sooner.  Performed at [facility], [place] [address], [place], [place] [address]          Radiology Studies: CT Head Wo Contrast  Result Date: 06/26/2020 CLINICAL DATA:  85 year old post fall on blood thinners. Struck head with right forehead bruising. EXAM: CT HEAD WITHOUT CONTRAST TECHNIQUE: Contiguous axial images were obtained from the base of the skull through the vertex without intravenous contrast. COMPARISON:  Head CT 09/15/2019 FINDINGS: Brain: No intracranial hemorrhage, mass effect, or midline shift. Normal for age atrophy. No hydrocephalus. The basilar cisterns are patent. No evidence of territorial infarct or acute ischemia. No extra-axial or intracranial fluid collection. Vascular: Atherosclerosis of skullbase vasculature without hyperdense vessel or abnormal calcification. Skull: No fracture or focal lesion. Sinuses/Orbits: No acute fracture. Chronic opacification of right mastoid air cells. Bilateral cataract resection. Other: Large right frontal scalp hematoma. IMPRESSION: Large right frontal scalp hematoma. No acute intracranial abnormality. No skull fracture. Electronically Signed   By: Keith Rake M.D.   On: 06/26/2020 19:25   CT Cervical Spine Wo Contrast  Result Date: 06/26/2020 CLINICAL DATA:  Neck trauma.  Fall striking head. EXAM: CT CERVICAL SPINE  WITHOUT CONTRAST TECHNIQUE: Multidetector CT imaging of the cervical spine was performed without intravenous contrast. Multiplanar CT image reconstructions were also generated. COMPARISON:  None. FINDINGS: Alignment: No traumatic subluxation. Trace degenerative anterolisthesis of C5 on C6. Mild broad-based dextroscoliotic curvature. Skull base and vertebrae: No acute fracture. Vertebral body heights are maintained. The dens and skull base are intact. Occasional lucencies in the cervical spine are felt to be related to underlying osteopenia/osteoporosis. Bone island within T4 vertebral body. Soft tissues and spinal canal: No prevertebral fluid or swelling. No visible canal hematoma. Disc levels: Multilevel degenerative disc disease, most prominently affecting C6-C7. Multilevel facet hypertrophy. Upper chest: Biapical pleuroparenchymal scarring. Emphysema. No acute apical findings. Other: None. IMPRESSION: 1. No acute fracture or subluxation of the cervical spine. 2. Multilevel degenerative disc disease and facet hypertrophy. Electronically Signed   By: Keith Rake M.D.   On: 06/26/2020 19:28   DG Chest Port 1 View  Result Date: 06/26/2020 CLINICAL DATA:  Fall with nausea, back pain. EXAM: PORTABLE CHEST 1 VIEW COMPARISON:  None. FINDINGS: The heart is enlarged. Vascular calcifications are seen in the aortic arch. The costophrenic angles are blunted, likely reflecting small pleural effusions with associated atelectasis. No pneumothorax. Degenerative changes are seen in the spine. IMPRESSION: 1. Blunted costophrenic angles likely reflects small bilateral pleural effusions with associated atelectasis. 2. Cardiomegaly. Electronically Signed   By: Zerita Boers M.D.   On: 06/26/2020 18:46        Scheduled Meds: . amiodarone  100 mg Oral BID  . apixaban  2.5 mg Oral BID  . umeclidinium bromide  1 puff Inhalation Daily   And  . fluticasone furoate-vilanterol  1 puff Inhalation Daily  . isosorbide  mononitrate  30 mg Oral Daily  . levothyroxine  25 mcg Oral q morning  . metoprolol tartrate  25 mg Oral BID  . pantoprazole  40 mg Oral Daily  . rosuvastatin  10 mg Oral Daily  . sodium chloride flush  3 mL Intravenous Q12H  . torsemide  20 mg Oral BID   Continuous Infusions:   LOS: 0 days        Hosie Poisson, MD [facility]   To contact the attending provider between 7A-7P or the covering provider during after hours 7P-7A, please log into  the web site [email or web address] and access using universal [place] password for that web site. If you do not have the password, please call the hospital operator.  06/27/2020, 11:34 AM

## 2020-06-27 NOTE — Care Management Obs Status (Signed)
[facility] NOTIFICATION   Patient Details  Name: [name] MRN: [id number] Date of Birth: [birth year]   Medicare Observation Status Notification Given:  Yes    Bethena Roys, RN 06/27/2020, 2:57 PM

## 2020-06-27 NOTE — Progress Notes (Signed)
Occupational Therapy Evaluation Patient Details Name: [name] MRN: [id number] DOB: 11/10/1935 Today's Date: 06/27/2020    History of Present Illness Per Internal Medicine Resident H&P- "[name] is a 85 y.o. female with medical history significant of abdominal pain, heart failure, mural thrombus, CAD status post MI and stent, anxiety, depression, hypertension, CKD 3, COPD, esophageal dysmotility, GERD, hearing loss, A. fib, RLS who presents following a fall at home. Patient had a fall that was unwitnessed earlier today at home. She lives alone. Friend came to check on her and found her on the floor leaned up against her couch.  Her glasses were 10 to 20 feet away and she had a laceration and hematoma on her head.  Patient does not remember the fall and she is on blood thinners due to her A. fib and mural thrombus. She has no memory of what happened around the time of her fall, but she is currently alert and oriented x4. Has some ongoing nausea. She denies fevers, chills, chest pain shortness of breath, abdominal pain, constipation, diarrhea."   Clinical Impression   Prior to hospitalization, pt was living alone in a 1st floor apartment with no STE. Pt's sister stopped by daily to assist as needed (mostly with driving and groceries), however sister's health is also declining per pt. Pt was independent with ADLs/ADL mobility/IADLs (except driving) without use of a mobility device. Pt wore 3L of O2 via [place] PRN at home prior to admission. Today, pt received semi-reclined in bed, pt agreeable to OT eval. Pt presents with increased bruising/edema to bilateral eyes and R forehead 2/2 recent fall at home. Pt appears to be deconditioned and mildly off balance compared to baseline level. Pt required supervision for bed mobility, min guard for sit>stand from EOB, min guard for L lateral side stepping, and min guard for marching in place. Further functional mobility deferred 2/2 Echo tech present. Pt  required min guard for LB self-care and setup-mod I for UB self-care overall. Educated pt on potential use of mobility device 2/2 decreased balance during ADLs/ADL mobility, PLB, activity pacing, safety awareness, d/c plans, and adaptive ADL strategies. Pt would benefit from continued skilled acute care OT services to maximize independence with ADLs/ADL mobility to ensure safety.     Follow Up Recommendations  Other (comment);Supervision/Assistance - 24 hour ([place] with initial 24/7 S/A if possible as of now, may progress to home with PRN S/A and HHOT)    Equipment Recommendations  None recommended by OT    Recommendations for Other Services PT consult     Precautions / Restrictions Precautions Precautions: Fall Precaution Comments: Left eye low vision due to MD Restrictions Weight Bearing Restrictions: No      Mobility Bed Mobility Overal bed mobility: Modified Independent (with HOB slightly raised)     General bed mobility comments: able to adjust herself in bed with extended time and effort    Transfers Overall transfer level: Needs assistance Equipment used: None Transfers: Sit to/from Stand;Lateral/Scoot Transfers Sit to Stand: Supervision;Min guard        Lateral/Scoot Transfers: Min guard General transfer comment: stood EOB with min guard, lateral stepped L with min guard towards HOB, marched in place with min guard, limited OOB ADL mobility observation 2/2 echo tech present    Balance Overall balance assessment: Needs assistance;History of Falls Sitting-balance support: Bilateral upper extremity supported;Feet supported Sitting balance-Leahy Scale: Normal     Standing balance support: During functional activity;Single extremity supported (hand held  PRN) Standing balance-Leahy Scale: Fair Standing balance comment: slighty unsteady     ADL either performed or assessed with clinical judgement   ADL Overall ADL's : Needs  assistance/impaired Eating/Feeding: Modified independent;Bed level (with tray out front)   Grooming: Min guard;Sitting;Standing (simulated at bedside)   Upper Body Bathing: Modified independent;Sitting (simulated at EOB)   Lower Body Bathing: Min guard;Sitting/lateral leans;Sit to/from stand (simulated at EOB)   Upper Body Dressing : Modified independent;Sitting (for gown management sitting EOB)   Lower Body Dressing: Min guard;Sitting/lateral leans;Sit to/from stand (simulated at EOB)   Toilet Transfer: Min guard (simulated a stand pivot transfer to [facility] with min guard and no mobility device; able to side step to the L without LOB)   Toileting- Clothing Manipulation and Hygiene: Min guard;Sitting/lateral lean;Sit to/from stand (simulated EOB, with purewick at the moment)   Tub/ Banker:  (not assessed today)   Functional mobility during ADLs: Supervision/safety;Min guard (within room near bedside) General ADL Comments: OOB ADL eval limited 2/2 echo tech present, however pt appears to require min guard for safety without mobility device (would advise RW for now), mostly min guard for ADLs     Vision Baseline Vision/History: Wears glasses;Macular Degeneration (L eye MD, right eye typically functioning well) Wears Glasses: At all times (not present during eval) Patient Visual Report: Other (comment) (irritation to bilateral eyes 2/2 bruising and edema (from falling), can see well out of R eye, L eye limited 2/2 MD) Vision Assessment?: Vision impaired- to be further tested in functional context     Perception Perception Perception Tested?: No   Praxis Praxis Praxis tested?: Within functional limits    Pertinent Vitals/Pain Pain Assessment:  (back and head but does not give numerical rating)     Hand Dominance Right   Extremity/Trunk Assessment Upper Extremity Assessment Upper Extremity Assessment: Overall WFL for tasks assessed   Lower Extremity Assessment Lower  Extremity Assessment: Overall WFL for tasks assessed   Cervical / Trunk Assessment Cervical / Trunk Assessment: Kyphotic   Communication Communication Communication: HOH   Cognition Arousal/Alertness: Awake/alert Behavior During Therapy: WFL for tasks assessed/performed Overall Cognitive Status: Within Functional Limits for tasks assessed      General Comments: required cue for exact date but oriented otherwise   General Comments  increased bruising and edema noted to bilateral eyes and R forehead from falling, typically has hearing aides but not present, had HH therapy in the past, PRN 3L of O2 at home, currently on 3L of O2      Home Living Family/patient expects to be discharged to:: Private residence Living Arrangements: Alone Available Help at Discharge: Family;Available PRN/intermittently;Other (Comment) (sister PRN) Type of Home: Apartment Home Access: Level entry     Home Layout: One level     Bathroom Shower/Tub: Teacher, early years/pre: Standard Bathroom Accessibility: Yes How Accessible: Accessible via walker Home Equipment: [place] - 2 wheels;Cane - single point;Grab bars - toilet;Grab bars - tub/shower;Shower seat          Prior Functioning/Environment Level of Independence: Independent        Comments: does not drive, sister comes over daily (however sister is now limited in her abilities), independent with ADLs/ADL mobility without mobility device, independent with most IADLs except no driving (sister drives), independent with medication management/cooking/cleaning        OT Problem List: Decreased strength;Decreased activity tolerance;Impaired balance (sitting and/or standing);Impaired vision/perception;Decreased safety awareness;Decreased knowledge of use of DME or AE;Pain;Increased edema  OT Treatment/Interventions: Self-care/ADL training;Therapeutic exercise;Neuromuscular education;Energy conservation;DME and/or AE  instruction;Therapeutic activities;Visual/perceptual remediation/compensation;Patient/family education;Balance training    OT Goals(Current goals can be found in the care plan section) Acute Rehab OT Goals Patient Stated Goal: return home OT Goal Formulation: With patient Time For Goal Achievement: 07/11/20 Potential to Achieve Goals: Good  OT Frequency: Min 2X/week   Barriers to D/C: Decreased caregiver support (sister is not limited in assisting pt)  safety concerns, would benefit from an ALF placement          AM-PAC OT "6 Clicks" Daily Activity     Outcome Measure Help from another person eating meals?: None Help from another person taking care of personal grooming?: A Little Help from another person toileting, which includes using toliet, bedpan, or urinal?: A Little Help from another person bathing (including washing, rinsing, drying)?: A Little Help from another person to put on and taking off regular upper body clothing?: None Help from another person to put on and taking off regular lower body clothing?: A Little 6 Click Score: 20   End of Session Equipment Utilized During Treatment: Gait belt Nurse Communication: Mobility status  Activity Tolerance: Patient tolerated treatment well;Other (comment) (limited by echo tech present) Patient left: in bed;with call bell/phone within reach;Other (comment) (echo tech present)  OT Visit Diagnosis: Unsteadiness on feet (R26.81);Muscle weakness (generalized) (M62.81);Pain Pain - Right/Left:  (back and head) Pain - part of body:  (back and head)                Time: [id number] OT Time Calculation (min): 25 min Charges:  OT General Charges $OT Visit: 1 Visit OT Evaluation $OT Eval Low Complexity: 1 Low OT Treatments $Therapeutic Activity: 8-22 mins  Michel Bickers, OTR/L [facility] [phone]  Francesca Jewett 06/27/2020, 10:53 AM

## 2020-06-27 NOTE — Consult Note (Signed)
Palliative Medicine Inpatient Consult Note  Reason for consult:  Goals of Care, Falls  HPI:  Per intake H&P --> [name] a 85 y.o.femalewith medical history significant ofabdominal pain, heart failure, mural thrombus, CAD status post MI and stent, anxiety, depression, hypertension, CKD 3, COPD, esophageal dysmotility, GERD, hearing loss, A. fib, RLS who presents following a fall at home.  Palliative care was asked to get involved to address goals of care with [name] in the setting of her recurrent falls and multiple chronic comorbid conditions.  Clinical Assessment/Goals of Care:  *Please note that this is a verbal dictation therefore any spelling or grammatical errors are due to the "[place]" system interpretation.  I have reviewed medical records including EPIC notes, labs and imaging, received report from bedside RN, assessed the patient who was sitting up in the bed alert and oriented.    I met with [name] to further discuss diagnosis prognosis, GOC, EOL wishes, disposition and options.   I introduced Palliative Medicine as specialized medical care for people living with serious illness. It focuses on providing relief from the symptoms and stress of a serious illness. The goal is to improve quality of life for both the patient and the family.  [name] shares with me that she is from [place], [place].  She has married in the past though is divorced.  She had 2 children both of whom have passed away.  Her daughter passed away 7 years ago in the setting of diabetes complications and her son passed away 2 years ago in the setting of drug addiction.  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  She does not consider herself to be an overly religious woman.  [name] gets joy at of being independent and she shares with me she will challenge anyone who attempts to take that away from her.  Prior to  admission to the hospital [name] had been living independently in an apartment.  She was able to perform all basic activities of daily living for herself.  From a mobility perspective she did utilize a cane and a walker though she shares with me she did not use them as often as she probably should have.  We reviewed [name]'s chronic medical conditions inclusive of her history of heart failure, CKD type III, COPD, A. fib.  We discussed that these are all chronic diseases which over time often worsened in severity.  [name] attests that she maintains a very strict diet and follows all doctors orders in regard to her daily care.  [name] is very confident that she can continue caring for herself despite her physical deficits.  A detailed discussion was had today regarding advanced directives- [name] reviews with me that she has never completed formal directives though her sister, [name] would be her Media planner.    Concepts specific to code status, artifical feeding and hydration, continued IV antibiotics and rehospitalization was had.  Reviewed prior MOST shared with Chaia that cardiopulmonary resuscitation given her age and comorbid conditions would result in very poor long-term deficits and very little to no benefit to her.  I provided a new MOST form and given this information she and I were able to complete it as prior though have changed her to a DO NOT RESUSCITATE code status.  MOST as below:   Cardiopulmonary Resuscitation: Do Not Attempt Resuscitation (DNR/No CPR)  Medical Interventions: Limited Additional Interventions: Use medical  treatment, IV fluids and cardiac monitoring as indicated, DO NOT USE intubation or mechanical ventilation. May consider use of less invasive airway support such as BiPAP or CPAP. Also provide comfort measures. Transfer to the hospital if indicated. Avoid intensive care.   Antibiotics: Antibiotics if indicated  IV Fluids: IV fluids if indicated  Feeding Tube:  No feeding tube   The difference between a aggressive medical intervention path  and a palliative comfort care path for this patient at this time was had.  Patient is agreeable to outpatient palliative support though shares with me that she would "never" go on to hospice care.  I asked her why she feels so strongly regarding this and she goes on to tell me that she would not want to leave her home under any circumstances.  She shares with me that her value in life is established by her independence and she does not wish to lose that.  Ruther verbalizes that she would not be in agreement with skilled nursing placement as the care team is trying to pursue with her and she will go home, be home, and die in her home.  Discussed the importance of continued conversation with family and their  medical providers regarding overall plan of care and treatment options, ensuring decisions are within the context of the patients values and GOCs.  Decision Maker: [name] (sister): [phone]  SUMMARY OF RECOMMENDATIONS   DNAR/DNI  MOST Completed, paper copy placed onto the chart electric copy can be found in [facility]  DNR Form Completed, paper copy placed onto the chart electric copy can be found in [facility]  Will request chaplain to aid in advance directive completion  TOC - Outpatient palliative care  Patient is adamantly opposed to SNF placement she is a member of the remote health program and would like to go back to her apartment with them resuming checkups on her  PMT will continue to follow incrementally  Falls: - Physical activity - Ensure wearing eyeglasses or contact lenses - Ensure wearing hearing aid - Find out about the side effects of any medicine you take - Ensure adequate sleep - Stand up slowly - Use an assistive devices  - Wear non-skid, rubber-soled, low-heeled shoes, or lace-up shoes with non-skid soles       that fully support feet.   Code Status/Advance Care  Planning: DNAR/DNI   Palliative Prophylaxis:   Oral Care, Mobility, Delirium  Additional Recommendations (Limitations, Scope, Preferences):  Continue current scope of care   Psycho-social/Spiritual:   Desire for further Chaplaincy support:  No patient is not overly religious  Additional Recommendations:  Education on chronic comorbidities with focus on congestive heart failure, COPD, chronic kidney disease, and A. fib   Prognosis:  Guarded prognosis in the setting of recurrent inpatient stays and ER visits.  Frail elderly woman with multiple comorbidities.  The very high 19-monthmortality risk.  Discharge Planning:  Discharge plan is not clear at this time.  Vitals:   06/27/20 0834 06/27/20 1114  BP: (!) 136/55 (!) 161/59  Pulse: 62 61  Resp: 11 18  Temp: 98.3 F (36.8 C) 97.8 F (36.6 C)  SpO2: 95% 98%    Intake/Output Summary (Last 24 hours) at 06/27/2020 1616 Last data filed at 06/27/2020 0900 Gross per 24 hour  Intake 110 ml  Output --  Net 110 ml   Last Weight  Most recent update: 06/27/2020  5:47 AM   Weight  44.8 kg (98 lb 12.3 oz)  Gen: Frail elderly female in no acute distress HEENT: Large hematoma on right upper scalp, moist mucous membranes CV: Regular rate and irregular rhythm PULM: On room air ABD: soft/nontender/nondistended/normal bowel sounds EXT: No edema Neuro: Alert and oriented x3  PPS: 50%   This conversation/these recommendations were discussed with patient primary care team, Dr. Karleen Hampshire  Time In: 1600 Time Out: 1710 Total Time: 70 Greater than 50%  of this time was spent counseling and coordinating care related to the above assessment and plan.  [facility] Team Team Cell Phone: [phone] Please utilize secure chat with additional questions, if there is no response within 30 minutes please call the above phone number  Palliative Medicine Team providers are available by phone from  7am to 7pm daily and can be reached through the team cell phone.  Should this patient require assistance outside of these hours, please call the patient's attending physician.

## 2020-06-27 NOTE — Progress Notes (Signed)
Mobility Specialist: Progress Note   06/27/20 1537  Mobility  Activity Dangled on edge of bed  Level of Assistance Contact guard assist, steadying assist  Assistive Device None  Mobility Response Tolerated fair  Mobility performed by Mobility specialist  $Mobility charge 1 Mobility   Pre-Mobility: 88 HR Post-Mobility: 92 HR, 130/98 BP, 96% SpO2  Pt refused walking but was agreeable to sitting EOB and performing LE exercises. Pt was able to perform one set of 10 leg extensions before needing to get back in bed. Pt requested to return to bed due to back pain, no rating given. Bed alarm is on.   [facility] Sutton Hirsch Mobility Specialist Mobility Specialist Phone: [phone]

## 2020-06-27 NOTE — TOC Initial Note (Signed)
Transition of Care [facility]) - Initial/Assessment Note    Patient Details  Name: [name] MRN: [id number] Date of Birth: 1935/05/28  Transition [facility] Care [facility]) CM/SW Contact:    Ina Homes, [place] Phone Number: 06/27/2020, 3:42 PM  Clinical Narrative:                  SW met with pt at bedside to discuss PT recs for SNF. Pt confirmed demographics. Pt adamantly refusing SNF placement. Pt reports "I don't want to go there until I absolutely have to." SW explained SNF placement would be for short term rehab. Pt still refusing. Pt requests to d/c home with current/additional [place] services. Pt reports unsure of agency name but she currently has RN coming to home. Per chart review, pt was last active with [facility]. Pt reports she has r/w, cane and O2 (3L) with [employer]. Pt lives alone and her sister drives her to appts but sister [name] [phone]) is 49 y/o. SW inquired about sister [name] staying with pt at d/c, pt reported [name] is worse off than pt at this time. SW explained need for 24/7 A/S at d/c for safety, but pt still requests to d/c home.   Expected Discharge Plan: [place] Barriers to Discharge: Continued Medical Work up   Patient Goals and CMS Choice Patient states their goals for this hospitalization and ongoing recovery are:: return home with [facility]      Expected Discharge Plan and Services Expected Discharge Plan: [place]       Living arrangements for the past 2 months: Single Family Home                                      Prior Living Arrangements/Services Living arrangements for the past 2 months: Single Family Home Lives with:: Self Patient language and need for interpreter reviewed:: Yes            Current home services: DME,Home RN    Activities of Daily Living      Permission Sought/Granted Permission sought to share information with : Case Manager Permission granted to share information with : Yes,  Verbal Permission Granted  Share Information with NAME: [name]  Permission granted to share info w AGENCY: HH  Permission granted to share info w Relationship: Sister  Permission granted to share info w Contact Information: [phone]  Emotional Assessment Appearance:: Appears stated age Attitude/Demeanor/Rapport: Engaged Affect (typically observed): Apprehensive Orientation: : Oriented to Self,Oriented to Place,Oriented to  Time,Oriented to Situation      Admission diagnosis:  Syncope and collapse [R55] Vertigo [R42] Contusion of face, initial encounter [S00.83XA] Fall, initial encounter [W19.XXXA] Patient Active Problem List   Diagnosis Date Noted  . Concussion 06/26/2020  . Hematoma of frontal scalp 06/26/2020  . Vision changes 06/14/2020  . Mural thrombus of left atrium 03/09/20 03/14/2020  . Hypoxia with need for home oxygen  03/14/2020  . Chronic combined systolic and diastolic heart failure ([place]) 03/08/2020  . Stomatitis 01/21/2020  . Edema 12/03/2019  . AKI (acute kidney injury) ([place])   . Acute diastolic CHF (congestive heart failure) ([place])   . Hypokalemia 09/15/2019  . CKD (chronic kidney disease), stage III ([place]) 09/15/2019  . Elevated troponin 09/15/2019  . Hypertensive emergency 09/15/2019  . Dyslipidemia, goal LDL below 70 08/27/2019  . Acute ST elevation myocardial infarction (STEMI) of lateral wall (HCC)  08/26/2019  . Heart attack ([place]) 08/26/2019  . Ankle pain, left 07/23/2019  . Intertrigo 02/05/2019  . Thrush 01/10/2019  . Shingles 09/16/2018  . CAD S/P percutaneous coronary angioplasty 08/2019 05/06/2018  . Esophageal dysmotility 01/11/2018  . Weight gain 01/01/2018  . Abdominal bloating 01/01/2018  . Hyperkalemia 06/04/2017  . Grief at loss of child 04/02/2017  . Adenopathy, cervical 11/29/2016  . Dysuria 10/31/2016  . Vertigo 10/31/2016  . Ureteral calculus 09/11/2016  . CAP (community acquired pneumonia) 08/30/2016  . Fatigue 08/24/2016  .  Sensation of pressure in bladder area 08/24/2016  . Kidney stone 08/13/2016  . Flank pain 08/10/2016  . Right upper quadrant abdominal tenderness without rebound tenderness 08/10/2016  . Leukocytosis 08/10/2016  . Acute abdomen 08/10/2016  . Intractable nausea and vomiting 08/10/2016  . ARF (acute renal failure) ([place]) 08/10/2016  . Urolithiasis 08/10/2016  . Abdominal pain 08/10/2016  . Anemia 05/19/2016  . Generalized anxiety disorder 01/14/2016  . Dysphagia 04/16/2015  . Dry skin dermatitis 12/28/2014  . Atrophic vaginitis 05/26/2014  . Varicose veins of both lower extremities 12/12/2013  . Restless leg syndrome 03/17/2013  . Impacted cerumen of left ear 03/17/2013  . Osteoarthritis, hand 09/05/2011  . Epistaxis, recurrent 08/17/2011  . Well adult exam 01/31/2011  . Low back pain 01/31/2011  . Palpitations 05/24/2010  . Cerumen impaction 02/23/2010  . PHARYNGITIS, ACUTE 02/23/2010  . SINUSITIS, ACUTE 01/07/2010  . SOB (shortness of breath) 09/23/2009  . BRONCHITIS, ACUTE 08/03/2009  . Osteoarthritis 07/06/2009  . TOBACCO USE, QUIT 03/08/2009  . ALLERGIC RHINITIS 11/06/2008  . NAUSEA ALONE 08/19/2008  . DIARRHEA 08/19/2008  . Benign essential HTN 07/15/2008  . EMPHYSEMA 07/15/2008  . Peptic ulcer 07/15/2008  . ARTHRITIS 07/15/2008  . HEADACHE 05/25/2008  . SYNCOPE 03/23/2008  . WEIGHT LOSS, ABNORMAL 03/23/2008  . HEARING IMPAIRMENT 12/23/2007  . B12 deficiency 10/14/2007  . Malaise 10/14/2007  . INSOMNIA, PERSISTENT 07/31/2007  . Anxiety state 03/01/2007  . DEPRESSION 03/01/2007  . Persistent atrial fibrillation ([place]) 02/26/2007  . Vitamin D deficiency 02/19/2007  . GERD 02/19/2007  . COPD with chronic bronchitis ([place]) 08/07/2006   PCP:  Cassandria Anger, MD Pharmacy:   [place] [phone] [address] [place] [address] Phone: [phone] Fax: [phone]  [facility] [address] [place] [address] Phone: [phone] Fax: [phone]  [facility] - [place], [place] - [address] [place] [address] Phone: [phone] Fax: [phone]     Social Determinants of Health (SDOH) Interventions    Readmission Risk Interventions Readmission Risk Prevention Plan 08/28/2019  Transportation Screening Complete  PCP or Specialist Appt within 5-7 Days Complete  Home Care Screening Complete  Medication Review (RN CM) Complete  Some recent data might be hidden

## 2020-06-27 NOTE — Progress Notes (Signed)
Transition of Care [facility]) - CAGE-AID Screening   Patient Details  Name: [name] MRN: [id number] Date of Birth: 06/24/1935  Transition [facility]) CM/SW Contact:    Clovis Cao, RN Phone Number: [phone] 06/27/2020, 6:40 PM   Clinical Narrative: Pt fell and is on blood thinners -- initially had some confusion.  Pt denies alcohol or drug use.   CAGE-AID Screening:    Have You Ever Felt You Ought to Cut Down on Your Drinking or Drug Use?: No Have People Annoyed You By Critizing Your Drinking Or Drug Use?: No Have You Felt Bad Or Guilty About Your Drinking Or Drug Use?: No Have You Ever Had a Drink or Used Drugs First Thing In The Morning to Steady Your Nerves or to Get Rid of a Hangover?: No CAGE-AID Score: 0  Substance Abuse Education Offered: No

## 2020-06-27 NOTE — Progress Notes (Incomplete)
  Echocardiogram 2D Echocardiogram has been performed.  Cammy Brochure 06/27/2020, 10:32 AM

## 2020-06-28 ENCOUNTER — Observation Stay (INDEPENDENT_AMBULATORY_CARE_PROVIDER_SITE_OTHER): Payer: Medicare Other

## 2020-06-28 ENCOUNTER — Telehealth: Payer: Self-pay | Admitting: Internal Medicine

## 2020-06-28 ENCOUNTER — Ambulatory Visit: Payer: Medicare Other | Admitting: Primary Care

## 2020-06-28 DIAGNOSIS — I480 Paroxysmal atrial fibrillation: Secondary | ICD-10-CM | POA: Diagnosis not present

## 2020-06-28 DIAGNOSIS — R55 Syncope and collapse: Secondary | ICD-10-CM

## 2020-06-28 DIAGNOSIS — I5042 Chronic combined systolic (congestive) and diastolic (congestive) heart failure: Secondary | ICD-10-CM | POA: Diagnosis not present

## 2020-06-28 DIAGNOSIS — E876 Hypokalemia: Secondary | ICD-10-CM

## 2020-06-28 DIAGNOSIS — I10 Essential (primary) hypertension: Secondary | ICD-10-CM | POA: Diagnosis not present

## 2020-06-28 LAB — BASIC METABOLIC PANEL
Anion gap: 9 (ref 5–15)
BUN: 31 mg/dL — ABNORMAL HIGH (ref 8–23)
CO2: 29 mmol/L (ref 22–32)
Calcium: 8.4 mg/dL — ABNORMAL LOW (ref 8.9–10.3)
Chloride: 96 mmol/L — ABNORMAL LOW (ref 98–111)
Creatinine, Ser: 2.11 mg/dL — ABNORMAL HIGH (ref 0.44–1.00)
GFR, Estimated: 23 mL/min — ABNORMAL LOW (ref >60)
Glucose, Bld: 83 mg/dL (ref 70–99)
Potassium: 3.7 mmol/L (ref 3.5–5.1)
Sodium: 134 mmol/L — ABNORMAL LOW (ref 135–145)

## 2020-06-28 LAB — CBC WITH DIFFERENTIAL/PLATELET
Abs Immature Granulocytes: 0.05 10*3/uL (ref 0.00–0.07)
Basophils Absolute: 0 10*3/uL (ref 0.0–0.1)
Basophils Relative: 0 %
Eosinophils Absolute: 0.1 10*3/uL (ref 0.0–0.5)
Eosinophils Relative: 1 %
HCT: 33.4 % — ABNORMAL LOW (ref 36.0–46.0)
Hemoglobin: 10.6 g/dL — ABNORMAL LOW (ref 12.0–15.0)
Immature Granulocytes: 0 %
Lymphocytes Relative: 11 %
Lymphs Abs: 1.3 10*3/uL (ref 0.7–4.0)
MCH: 26.8 pg (ref 26.0–34.0)
MCHC: 31.7 g/dL (ref 30.0–36.0)
MCV: 84.6 fL (ref 80.0–100.0)
Monocytes Absolute: 1.7 10*3/uL — ABNORMAL HIGH (ref 0.1–1.0)
Monocytes Relative: 15 %
Neutro Abs: 8.2 10*3/uL — ABNORMAL HIGH (ref 1.7–7.7)
Neutrophils Relative %: 73 %
Platelets: 241 10*3/uL (ref 150–400)
RBC: 3.95 MIL/uL (ref 3.87–5.11)
RDW: 22.6 % — ABNORMAL HIGH (ref 11.5–15.5)
WBC: 11.4 10*3/uL — ABNORMAL HIGH (ref 4.0–10.5)
nRBC: 0 % (ref 0.0–0.2)

## 2020-06-28 LAB — GLUCOSE, CAPILLARY: Glucose-Capillary: 89 mg/dL (ref 70–99)

## 2020-06-28 MED ORDER — APIXABAN 2.5 MG PO TABS: 2.5000 mg | ORAL_TABLET | Freq: Two times a day (BID) | ORAL | 6 refills | Status: AC

## 2020-06-28 MED ORDER — POLYETHYLENE GLYCOL 3350 17 G PO PACK: 17.0000 g | PACK | Freq: Every day | ORAL | 0 refills | Status: AC | PRN

## 2020-06-28 NOTE — Progress Notes (Signed)
This chaplain responded to PMT consult to create or update the Pt. Advance Directive.  The chaplain introduced herself and began AD education. The Pt. declined continued conversation on the topic.

## 2020-06-28 NOTE — TOC Transition Note (Signed)
Transition of Care (TOC) - CM/SW Discharge Note Marvetta Gibbons RN, BSN Transitions of Care Unit 4E- RN Case Manager See Treatment Team for direct phone #     Patient Details  Name: CELISE CLIATT MRN: [id number] Date of Birth: 1936/02/04  Transition of Care [facility]) CM/SW Contact:  Dawayne Patricia, RN Phone Number: 06/28/2020, 2:56 PM   Clinical Narrative:    Pt has been cleared for transition home, Levada Dy with [name] has accepted the [facility] referral - CM went back and informed pt who was pleased that [name] would be coming out- stated "I liked them". Have sent msg to MD regarding [place] orders needed for PT/OT/aide/sw.     Final next level of care: [place] Barriers to Discharge: Barriers Resolved   Patient Goals and CMS Choice Patient states their goals for this hospitalization and ongoing recovery are:: return home with [facility] CMS Medicare.gov Compare Post Acute Care list provided to:: Patient Choice offered to / list presented to : Patient  Discharge Placement                 Home with [facility]      Discharge Plan and Services   Discharge Planning Services: CM Consult Post Acute Care Choice: Home Health          DME Arranged: N/A DME Agency: NA       HH Arranged: PT,OT,Nurse's Aide,Social Work [employer] Agency: [place] Date [place]: 06/28/20 Time [place]: 1159 Representative spoke with at [place]: [place] ([place]) Interventions     Readmission Risk Interventions Readmission Risk Prevention Plan 08/28/2019  Transportation Screening Complete  PCP or Specialist Appt within 5-7 Days Complete  Home Care Screening Complete  Medication Review (RN CM) Complete  Some recent data might be hidden

## 2020-06-28 NOTE — Progress Notes (Signed)
Zio patch placed onto patient.  All instructions and information reviewed with patient, they verbalize understanding with no questions. 

## 2020-06-28 NOTE — Consult Note (Addendum)
Cardiology Consultation:   Patient ID: [name] MRN: [id number]; DOB: 04-11-1935  Admit date: 06/26/2020 Date of Consult: 06/28/2020  PCP:  Cassandria Anger, MD   [place]  Cardiologist:  Kirk Ruths, MD    Patient Profile:   [name] is a 85 y.o. 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 Karleen Hampshire.  History of Present Illness:   [name] has a history of STEMI 08/26/19 treated with balloon angioplasty alone to D1 due to small vessel size and poor distal runoff. No residual disease in the LM, LCx, LAD, or RCA, Unclear if it was a plaque rupture or embolic event. She did ot want to take plavix at follow up. Shew as in recurrent Afib Dec 2021. On 03/08/20 she was symptomatic with worsening CHF symptoms felt related to Afib RVR. Echo 03/09/20 with EF 35-40%, mild LVH, moderately reduced RV function, and mild to moderate LAE. She was agreeable to start 2.5 mg eliquis BID with plans for TEE-guided DCCV. TEE on 03/10/20 found LAA thrombus and DCCV was aborted. She remained SOB and diuretic was switched to 40 mg torsemide BID. She was seen by Almyra Deforest [facility] 04/27/20 with continued complaints of dyspnea. She was treated for PNA and COPD in Jan. She refused TEE-DCCV at that time due to needing a COVID swab. She called our office back and rescheduled. Repeat TEE March 2022 with right atrial enlargement, moderate MR, mild AI, and resolution of thrombus. DCCV to sinus rhythm in 05/11/20. Shew as seen in clinic by Dr. Stanford Breed 06/22/20. Due to worsening renal function, demadex was reduced to 20 mg BID for HFpEF with instructions to repeat labs in 2 days. She was bradycardic wth first degree AV block and intermittent junctional escape beat. Amiodarone continued at 100 mg BID, cardizem was  D/C'ed, and metoprolol was reduced to 25 mg BID. She called after hours 06/23/20 with elevated BP and HR 188. She was instructed to go to the ER, but refused. She eventually presented with 188/103-218/85. Cardiology was consulted and recommended addition of 30 mg imdur and 50 mg hydralazine TID. Phone notes indicate she was confused about her medication. She was brought back to ER by EMS on 06/25/20 with blurry vision in her right eye. She was discharged without admission. Unfortunately, she suffered an unwitnessed fall. She lives alone at home and was found in the floor with broken glasses several feet away. She had a laceration and hematoma on her head. She does not remember the fall. Given her multiple cardiac problems, cardiology was consulted.   During my interview, she reiterated that she does not remember the details surrounding her fall. She thinks she may have tripped on her oxygen tubing as she was walking from one room to the next. She denies palpitations, chest pain, prodrome, and dyspnea just prior to the fall. She has no complaints today. She refuses SNF placement and wishes to return home.   Past Medical History:  Diagnosis Date  . Anxiety   . Arthritis   . Atrial fibrillation ([place])   . B12 deficiency   . COPD (chronic obstructive pulmonary disease) ([place])   . Depression   . GERD (gastroesophageal reflux disease)   . Hypertension   . Hypoxia with need for  home oxygen  03/14/2020  . [place] thrombus of left atrium 03/09/20 03/14/2020  . Osteoarthrosis, hand    both hands  . Peptic ulcer, unspecified site, unspecified as acute or chronic, without mention of hemorrhage, perforation, or obstruction   . PONV (postoperative nausea and vomiting)   . Vitamin D deficiency disease     Past Surgical History:  Procedure Laterality Date  . BLADDER SURGERY     Bladder tack and intestines  . CARDIOVERSION N/A 05/11/2020   Procedure: CARDIOVERSION;  Surgeon: Lelon Perla, MD;  Location: [facility]  ENDOSCOPY;  Service: Cardiovascular;  Laterality: N/A;  . CORONARY/GRAFT ACUTE MI REVASCULARIZATION N/A 08/26/2019   Procedure: Coronary/Graft Acute MI Revascularization;  Surgeon: Sherren Mocha, MD;  Location: [facility] LAB;  Service: Cardiovascular;  Laterality: N/A;  . CYSTOCELE REPAIR    . CYSTOSCOPY WITH RETROGRADE PYELOGRAM, URETEROSCOPY AND STENT PLACEMENT Right 09/11/2016   Procedure: CYSTOSCOPY WITH RETROGRADE PYELOGRAM, URETEROSCOPY AND STENT REPLACEMENT;  Surgeon: Cleon Gustin, MD;  Location: [facility] ORS;  Service: Urology;  Laterality: Right;  . CYSTOSCOPY WITH STENT PLACEMENT Right 08/11/2016   Procedure: CYSTOSCOPY, URETERSCOPY,  RIGHT RETROGRADE WITH RIGHT STENT PLACEMENT;  Surgeon: Festus Aloe, MD;  Location: [facility] ORS;  Service: Urology;  Laterality: Right;  . SPLENECTOMY    . TEE WITHOUT CARDIOVERSION N/A 03/10/2020   Procedure: TRANSESOPHAGEAL ECHOCARDIOGRAM (TEE);  Surgeon: Josue Hector, MD;  Location: [facility] ENDOSCOPY;  Service: Cardiovascular;  Laterality: N/A;  . TEE WITHOUT CARDIOVERSION N/A 05/11/2020   Procedure: TRANSESOPHAGEAL ECHOCARDIOGRAM (TEE);  Surgeon: Lelon Perla, MD;  Location: [facility] ENDOSCOPY;  Service: Cardiovascular;  Laterality: N/A;     Home Medications:  Prior to Admission medications   Medication Sig Start Date End Date Taking? Authorizing Provider  acetaminophen (TYLENOL) 325 MG tablet Take 2 tablets (650 mg total) by mouth every 4 (four) hours as needed for headache or mild pain. Patient not taking: Reported on 06/24/2020 03/14/20   Isaiah Serge, NP  amiodarone (PACERONE) 200 MG tablet Take 1 tablet (200 mg total) by mouth daily. Patient taking differently: No sig reported 04/27/20   Almyra Deforest, PA  apixaban (ELIQUIS) 2.5 MG TABS tablet Take 1 tablet (2.5 mg total) by mouth 2 (two) times daily. 03/25/20   Lelon Perla, MD  Biotin 1000 MCG tablet Take 1,000 mcg by mouth daily. Patient not taking: Reported on 06/24/2020    [provider]  Cholecalciferol 25 MCG (1000 UT) capsule Take 1,000 Units by mouth daily.    [provider]  clonazePAM (KLONOPIN) 0.5 MG tablet Take 1 tablet (0.5 mg total) by mouth at bedtime as needed (leg cramps). TAKE 1 TABLET BY MOUTH AT BEDTIME AS NEEDED FOR LEG CRAMPS 03/14/20   Isaiah Serge, NP  cyanocobalamin 1000 MCG tablet Take 1,000 mcg by mouth daily. Patient not taking: Reported on 06/24/2020    [provider]  docusate sodium (COLACE) 100 MG capsule Take 1 capsule (100 mg total) by mouth daily as needed for mild constipation. 03/14/20   Isaiah Serge, NP  Fluticasone-Umeclidin-Vilant (TRELEGY ELLIPTA) 100-62.5-25 MCG/INH AEPB Inhale 1 Inhaler into the lungs daily. 12/03/19   Plotnikov, Evie Lacks, MD  hydrALAZINE (APRESOLINE) 50 MG tablet Take 1 tablet (50 mg total) by mouth 3 (three) times daily. 06/25/20 09/23/20  Minus Breeding, MD  hydrocerin (EUCERIN) CREA Apply 1 application topically 2 (two) times daily. Patient not taking: Reported on 06/24/2020 03/14/20   Isaiah Serge, NP  isosorbide mononitrate (IMDUR) 30  MG 24 hr tablet Take 1 tablet (30 mg total) by mouth daily. 06/25/20   Minus Breeding, MD  ketoconazole (NIZORAL) 2 % cream APPLY TOPICALLY TO THE AFFECTED AREA DAILY Patient not taking: Reported on 06/24/2020 08/11/19   Plotnikov, Evie Lacks, MD  levalbuterol [facility] HFA) 45 MCG/ACT inhaler Inhale 2 puffs into the lungs every 4 (four) hours as needed for wheezing or shortness of breath. 04/16/20   Margaretha Seeds, MD  levothyroxine (SYNTHROID) 25 MCG tablet Take 25 mcg by mouth every morning. 03/30/20   [provider]  megestrol (MEGACE) 400 MG/10ML suspension Take 5 mLs (200 mg total) by mouth 2 (two) times daily. 06/14/20 07/14/20  Plotnikov, Evie Lacks, MD  metoprolol tartrate (LOPRESSOR) 25 MG tablet Take 1 tablet (25 mg total) by mouth 2 (two) times daily. 06/22/20 09/20/20  Lelon Perla, MD  nitroGLYCERIN (NITROSTAT) 0.4 MG SL tablet Place 1 tablet (0.4  mg total) under the tongue every 5 (five) minutes as needed for chest pain. 08/28/19 08/27/20  Kathyrn Drown D, NP  nystatin (MYCOSTATIN) 100000 UNIT/ML suspension Take 5 mLs (500,000 Units total) by mouth 4 (four) times daily. Patient not taking: Reported on 06/24/2020 03/15/20   Hoyt Koch, MD  pantoprazole (PROTONIX) 40 MG tablet Take 1 tablet (40 mg total) by mouth daily. 03/25/20   Lelon Perla, MD  potassium chloride (KLOR-CON) 10 MEQ tablet Take 1 tablet (10 mEq total) by mouth daily. Patient taking differently: Take 10 mEq by mouth 2 (two) times daily. 03/25/20   Lelon Perla, MD  Propylene Glycol (SYSTANE BALANCE) 0.6 % SOLN Place 1 drop into both eyes 2 (two) times daily as needed (dry eyes).    [provider]  rosuvastatin (CRESTOR) 10 MG tablet Take 1 tablet (10 mg total) by mouth daily. 03/25/20   Lelon Perla, MD  torsemide (DEMADEX) 20 MG tablet Take 40 mg (2 tablets) in the morning and 20 mg (one tablet) in the evening 06/25/20   Minus Breeding, MD    Inpatient Medications: Scheduled Meds: . amiodarone  100 mg Oral BID  . umeclidinium bromide  1 puff Inhalation Daily   And  . fluticasone furoate-vilanterol  1 puff Inhalation Daily  . isosorbide mononitrate  30 mg Oral Daily  . levothyroxine  25 mcg Oral q morning  . metoprolol tartrate  25 mg Oral BID  . pantoprazole  40 mg Oral Daily  . rosuvastatin  10 mg Oral Daily  . sodium chloride flush  3 mL Intravenous Q12H   Continuous Infusions:  PRN Meds: acetaminophen **OR** acetaminophen, docusate sodium, levalbuterol, polyethylene glycol, polyvinyl alcohol  Allergies:    Allergies  Allergen Reactions  . Amoxicillin Other (See Comments)    "makes me crazy" Has patient had a PCN reaction causing immediate rash, facial/tongue/throat swelling, SOB or lightheadedness with hypotension: No Has patient had a PCN reaction causing severe rash involving mucus membranes or skin necrosis: No Has  patient had a PCN reaction that required hospitalization: No Has patient had a PCN reaction occurring within the last 10 years: No If all of the above answers are "NO", then may proceed with Cephalosporin use.   . Codeine Nausea And Vomiting  . Levaquin [Levofloxacin In D5w] Nausea And Vomiting  . Other Nausea And Vomiting    Pt does not want to take any pain meds    Social History:   Social History   Socioeconomic History  . Marital status: Divorced    Spouse name:  Not on file  . Number of children: 2  . Years of education: Not on file  . Highest education level: Not on file  Occupational History  . Occupation: Airline pilot: [employer]  Tobacco Use  . Smoking status: Former Smoker    Packs/day: 1.00    Types: Cigarettes    Quit date: 2008/05/24    Years since quitting: 12.3  . Smokeless tobacco: Never Used  . Tobacco comment: Pt unsure how many years she has been smoking   Vaping Use  . Vaping Use: Never used  Substance and Sexual Activity  . Alcohol use: No  . Drug use: No  . Sexual activity: Not Currently  Other Topics Concern  . Not on file  Social History Narrative   Retired, works part time   Divorced   Former Smoker   1- Son alcoholic abusive   Daughter died in 05/25/2006   Social Determinants of Health   Financial Resource Strain: High Risk  . Difficulty of Paying Living Expenses: Very hard  Food Insecurity: Food Insecurity Present  . Worried About Charity fundraiser in the Last Year: Sometimes true  . Ran Out of Food in the Last Year: Sometimes true  Transportation Needs: No Transportation Needs  . Lack of Transportation (Medical): No  . Lack of Transportation (Non-Medical): No  Physical Activity: Unknown  . Days of Exercise per Week: Patient refused  . Minutes of Exercise per Session: Not on file  Stress: No Stress Concern Present  . Feeling of Stress : Not at all  Social Connections: Not on file  Intimate Partner Violence: Not on file    Family  History:    Family History  Problem Relation Age of Onset  . Heart disease Father   . Arthritis Other        Family history of  . Coronary artery disease Other        Family history of  . Hypertension Other        Family history of  . Diabetes Daughter        64 died     ROS:  Please see the history of present illness.   All other ROS reviewed and negative.     Physical Exam/Data:   Vitals:   06/27/20 1910 06/28/20 0042 06/28/20 0358 06/28/20 0758  BP: (!) 154/57 (!) 187/64 (!) 177/68 (!) 170/57  Pulse: 67 68 65 73  Resp: 19 (!) '22 11 12  '$ Temp: 98.2 F (36.8 C) 98.2 F (36.8 C) 98.4 F (36.9 C) 98.3 F (36.8 C)  TempSrc: Oral Oral Oral Oral  SpO2: 99% 95% 94% 94%  Weight:      Height:       No intake or output data in the 24 hours ending 06/28/20 1145 Last 3 Weights 06/27/2020 06/26/2020 06/25/2020  Weight (lbs) 98 lb 12.3 oz 97 lb 97 lb  Weight (kg) 44.8 kg 44 kg 44 kg     Body mass index is 16.95 kg/m.  General:  Elderly female with significant bruising on her right face, forehead, and left eye HEENT: normal Neck: no JVD Vascular: No carotid bruits  Cardiac:  normal S1, S2; RRR; no murmur  Lungs:  clear to auscultation bilaterally, no wheezing, rhonchi or rales  Abd: soft, nontender, no hepatomegaly  Ext: no edema Musculoskeletal:  No deformities, BUE and BLE strength normal and equal Skin: warm and dry  Neuro:  CNs 2-12 intact, no focal abnormalities noted Psych:  Normal affect   EKG:  The EKG was personally reviewed and demonstrates:  Sinus rhythm with HR 67, first degree heart block, poor R wave progression, ST depression inferior and lateral leads Telemetry:  Telemetry was personally reviewed and demonstrates:  Primarily sinus rhythm HR in the 70s, PACs,   Relevant CV Studies:  Echo 06/27/20: 1. Left ventricular ejection fraction, by estimation, is 55 to 60%. The  left ventricle has normal function. The left ventricle has no regional  wall motion  abnormalities. There is mild left ventricular hypertrophy.  Left ventricular diastolic parameters  are indeterminate.  2. Right ventricular systolic function is mildly reduced. The right  ventricular size is mildly enlarged.  3. Left atrial size was severely dilated.  4. Right atrial size was moderately dilated.  5. The mitral valve is normal in structure. Mild mitral valve  regurgitation. No evidence of mitral stenosis.  6. The aortic valve has an indeterminant number of cusps. Aortic valve  regurgitation is trivial. No aortic stenosis is present.  7. The inferior vena cava is normal in size with greater than 50%  respiratory variability, suggesting right atrial pressure of 3 mmHg.   Laboratory Data:  High Sensitivity Troponin:   Recent Labs  Lab 06/26/20 1830 06/26/20 2133  TROPONINIHS 146* 185*     Chemistry Recent Labs  Lab 06/26/20 1830 06/27/20 0556 06/28/20 0157  NA 134* 136 134*  K 4.7 3.7 3.7  CL 95* 98 96*  CO2 '28 27 29  '$ GLUCOSE 127* 75 83  BUN 29* 28* 31*  CREATININE 1.92* 2.01* 2.11*  CALCIUM 9.6 8.7* 8.4*  GFRNONAA 26* 24* 23*  ANIONGAP '11 11 9    '$ Recent Labs  Lab 06/26/20 1830 06/27/20 0556  PROT 6.6 5.1*  ALBUMIN 3.2* 2.5*  AST 71* 47*  ALT 28 23  ALKPHOS 56 44  BILITOT 0.7 1.0   Hematology Recent Labs  Lab 06/26/20 1830 06/27/20 0556 06/28/20 0157  WBC 15.4* 11.1* 11.4*  RBC 4.68 3.73* 3.95  HGB 12.5 10.0* 10.6*  HCT 39.0 31.2* 33.4*  MCV 83.3 83.6 84.6  MCH 26.7 26.8 26.8  MCHC 32.1 32.1 31.7  RDW 23.2* 22.4* 22.6*  PLT 273 229 241   BNPNo results for input(s): BNP, PROBNP in the last 168 hours.  DDimer No results for input(s): DDIMER in the last 168 hours.   Radiology/Studies:  CT Head Wo Contrast  Result Date: 06/26/2020 CLINICAL DATA:  85 year old post fall on blood thinners. Struck head with right forehead bruising. EXAM: CT HEAD WITHOUT CONTRAST TECHNIQUE: Contiguous axial images were obtained from the base of the  skull through the vertex without intravenous contrast. COMPARISON:  Head CT 09/15/2019 FINDINGS: Brain: No intracranial hemorrhage, mass effect, or midline shift. Normal for age atrophy. No hydrocephalus. The basilar cisterns are patent. No evidence of territorial infarct or acute ischemia. No extra-axial or intracranial fluid collection. Vascular: Atherosclerosis of skullbase vasculature without hyperdense vessel or abnormal calcification. Skull: No fracture or focal lesion. Sinuses/Orbits: No acute fracture. Chronic opacification of right mastoid air cells. Bilateral cataract resection. Other: Large right frontal scalp hematoma. IMPRESSION: Large right frontal scalp hematoma. No acute intracranial abnormality. No skull fracture. Electronically Signed   By: Keith Rake M.D.   On: 06/26/2020 19:25   CT Cervical Spine Wo Contrast  Result Date: 06/26/2020 CLINICAL DATA:  Neck trauma.  Fall striking head. EXAM: CT CERVICAL SPINE WITHOUT CONTRAST TECHNIQUE: Multidetector CT imaging of the cervical spine was performed without intravenous contrast. Multiplanar CT  image reconstructions were also generated. COMPARISON:  None. FINDINGS: Alignment: No traumatic subluxation. Trace degenerative anterolisthesis of C5 on C6. Mild broad-based dextroscoliotic curvature. Skull base and vertebrae: No acute fracture. Vertebral body heights are maintained. The dens and skull base are intact. Occasional lucencies in the cervical spine are felt to be related to underlying osteopenia/osteoporosis. Bone island within T4 vertebral body. Soft tissues and spinal canal: No prevertebral fluid or swelling. No visible canal hematoma. Disc levels: Multilevel degenerative disc disease, most prominently affecting C6-C7. Multilevel facet hypertrophy. Upper chest: Biapical pleuroparenchymal scarring. Emphysema. No acute apical findings. Other: None. IMPRESSION: 1. No acute fracture or subluxation of the cervical spine. 2. Multilevel  degenerative disc disease and facet hypertrophy. Electronically Signed   By: Keith Rake M.D.   On: 06/26/2020 19:28   DG Chest Port 1 View  Result Date: 06/26/2020 CLINICAL DATA:  Fall with nausea, back pain. EXAM: PORTABLE CHEST 1 VIEW COMPARISON:  None. FINDINGS: The heart is enlarged. Vascular calcifications are seen in the aortic arch. The costophrenic angles are blunted, likely reflecting small pleural effusions with associated atelectasis. No pneumothorax. Degenerative changes are seen in the spine. IMPRESSION: 1. Blunted costophrenic angles likely reflects small bilateral pleural effusions with associated atelectasis. 2. Cardiomegaly. Electronically Signed   By: Zerita Boers M.D.   On: 06/26/2020 18:46   ECHOCARDIOGRAM COMPLETE  Result Date: 06/27/2020    ECHOCARDIOGRAM REPORT   Patient Name:   [name] Date of Exam: 06/27/2020 Medical Rec #:  [id number]     Height:       64.0 in Accession #:    [id number]    Weight:       98.8 lb Date of Birth:  05-29-1935     BSA:          1.450 m Patient Age:    49 years      BP:           145/61 mmHg Patient Gender: F             HR:           47 bpm. Exam Location:  Inpatient Procedure: 2D Echo, Cardiac Doppler and Color Doppler Indications:    Syncope  History:        Patient has no prior history of Echocardiogram examinations and                 Patient has prior history of Echocardiogram examinations, most                 recent 03/09/2020. CAD, COPD; Risk Factors:Hypertension.  Sonographer:    Cammy Brochure Referring Phys: [id number] [place]  1. Left ventricular ejection fraction, by estimation, is 55 to 60%. The left ventricle has normal function. The left ventricle has no regional wall motion abnormalities. There is mild left ventricular hypertrophy. Left ventricular diastolic parameters are indeterminate.  2. Right ventricular systolic function is mildly reduced. The right ventricular size is mildly enlarged.  3. Left atrial  size was severely dilated.  4. Right atrial size was moderately dilated.  5. The mitral valve is normal in structure. Mild mitral valve regurgitation. No evidence of mitral stenosis.  6. The aortic valve has an indeterminant number of cusps. Aortic valve regurgitation is trivial. No aortic stenosis is present.  7. The inferior vena cava is normal in size with greater than 50% respiratory variability, suggesting right atrial pressure of 3 mmHg. FINDINGS  Left Ventricle: Left ventricular  ejection fraction, by estimation, is 55 to 60%. The left ventricle has normal function. The left ventricle has no regional wall motion abnormalities. The left ventricular internal cavity size was normal in size. There is  mild left ventricular hypertrophy. Left ventricular diastolic parameters are indeterminate. Right Ventricle: The right ventricular size is mildly enlarged.Right ventricular systolic function is mildly reduced. Left Atrium: Left atrial size was severely dilated. Right Atrium: Right atrial size was moderately dilated. Pericardium: There is no evidence of pericardial effusion. Mitral Valve: The mitral valve is normal in structure. Mild mitral annular calcification. Mild mitral valve regurgitation. No evidence of mitral valve stenosis. Tricuspid Valve: The tricuspid valve is normal in structure. Tricuspid valve regurgitation is mild . No evidence of tricuspid stenosis. Aortic Valve: The aortic valve has an indeterminant number of cusps. Aortic valve regurgitation is trivial. No aortic stenosis is present. Aortic valve mean gradient measures 1.0 mmHg. Aortic valve peak gradient measures 1.4 mmHg. Aortic valve area, by VTI measures 3.32 cm. Pulmonic Valve: The pulmonic valve was normal in structure. Pulmonic valve regurgitation is trivial. No evidence of pulmonic stenosis. Aorta: The aortic root is normal in size and structure. Venous: The inferior vena cava is normal in size with greater than 50% respiratory variability,  suggesting right atrial pressure of 3 mmHg. IAS/Shunts: No atrial level shunt detected by color flow Doppler. Additional Comments: There is a small pleural effusion in the left lateral region.  LEFT VENTRICLE PLAX 2D LVIDd:         4.20 cm LVIDs:         2.65 cm LV PW:         1.30 cm LV IVS:        1.10 cm LVOT diam:     2.10 cm LV SV:         49 LV SV Index:   34 LVOT Area:     3.46 cm  LV Volumes (MOD) LV vol d, MOD A2C: 77.3 ml LV vol d, MOD A4C: 75.9 ml LV vol s, MOD A2C: 33.4 ml LV vol s, MOD A4C: 33.2 ml LV SV MOD A2C:     43.9 ml LV SV MOD A4C:     75.9 ml LV SV MOD BP:      44.0 ml RIGHT VENTRICLE RV Basal diam:  3.90 cm RV S prime:     9.03 cm/s TAPSE (M-mode): 2.0 cm LEFT ATRIUM           Index       RIGHT ATRIUM           Index LA diam:      4.20 cm 2.90 cm/m  RA Area:     19.50 cm LA Vol (A4C): 77.7 ml 53.60 ml/m RA Volume:   60.60 ml  41.81 ml/m  AORTIC VALVE AV Area (Vmax):    3.40 cm AV Area (Vmean):   3.29 cm AV Area (VTI):     3.32 cm AV Vmax:           58.80 cm/s AV Vmean:          44.000 cm/s AV VTI:            0.148 m AV Peak Grad:      1.4 mmHg AV Mean Grad:      1.0 mmHg LVOT Vmax:         57.80 cm/s LVOT Vmean:        41.800 cm/s LVOT VTI:  0.142 m LVOT/AV VTI ratio: 0.96  AORTA Ao Root diam: 2.80 cm MITRAL VALVE MV Area (PHT): 3.74 cm    SHUNTS MV Decel Time: 203 msec    Systemic VTI:  0.14 m MV E velocity: 71.10 cm/s  Systemic Diam: 2.10 cm Kirk Ruths MD Electronically signed by Kirk Ruths MD Signature Date/Time: 06/27/2020/11:39:19 AM    Final      Assessment and Plan:   Syncope and collapse - unwitnessed fall at home in which the patient does not remember the details or fall - CT head showed large right frontal scalp hematoma but no intracranial process - unclear etiology of possible syncope and fall: AV block, bradycardia, pauses, Afib RVR, ACS, hypotension - EKG on arrival was sinus rhythm  - telemetry with PACs with possible second degree type 1  block - will place live zio monitor - 14 days - will also stop metoprolol    Leukocytosis - agree with checking UA to rule out UTI  - CXR negative for infectious process   PAF s/p TEE-guyided DCCV 05/11/20 Chronic anticoagulation LAA thrombus - resolved on TEE 05/11/20 - she is outside the window for anticoagulation following DCCV - encouraging that LAA resolved on repeat TEE in March - telemetry with primarily sinus rhythm with PACs, possible second degree type 1 heart block - echo with resolution of EF to 55-60%   Hypertension - BP with some lability here - SBP 170-180s today - she is on 30 mg imdur, 25 mg lopressor BID - hydralazine not yet ordered - reduce to 10 mg TID - make take extra 10 mg for SBP over 160 - would like to avoid hypotension given her recent fall on eliquis   Chronic systolic heart failure - echo yesterday with EF improved to 55-60%, mild RV dysfunction, severely dilated left atrium, moderately dilated right atrium - improved from 35-40% in Jan 2022 likely due to restoration of sinus rhythm - she does not appear volume up, I do not suspect a respiratory event preceding her syncope and fall - continue chronic home O2   Hypokalemic  - was 2.8 on 06/23/20 in the setting of hypertensive urgency --> repeat K 4.5 - she was discharged on potassium supplementation - K now 3.7 - would aim for K of 4.0   CKD stage III - sCr 2.11 - appears near baseline - will avoid ACEI/ARB    Risk Assessment/Risk Scores:       For questions or updates, please contact [facility] Please consult [email or web address] for contact info under    Signed, Ledora Bottcher, PA  06/28/2020 11:45 AM  Personally seen and examined. Agree with above.   85 year old with fall at home, hematoma right side of face especially forehead, known CAD.  When I asked her what happened, she says I think I know what happened now, I tripped over my oxygen cord which is over 20 feet long.  She  says that she is tripped over this in the past but has not fallen to this degree.  Currently without any chest pain fevers chills nausea vomiting syncope bleeding orthopnea PND.  Her exam is noteworthy for large forehead hematoma right side, right-sided facial ecchymosis.  Telemetry predominantly sinus rhythm/sinus bradycardia with rare episode of Wenkebach phenomenon.  Heart is regular rate and rhythm.  Assessment and plan:  Fall, possible syncope - She now thinks this was a mechanical fall.  Certainly could be.  Nonetheless, a 14-day live ZIO monitor would be helpful to make sure  that there is no adverse arrhythmias especially given her rare PACs and second-degree heart block type I noted on monitor.  We are stopping her low-dose metoprolol 25 twice daily.  Paroxysmal atrial fibrillation - Had cardioversion recently on 05/11/2020.  Previously had left atrial appendage thrombus on TEE.  Because of this, I would like for her to get back on her appropriately dosed Eliquis at 2.5 mg twice a day after holding for 3 days given hematoma.  She has not had repeated falls.  Obviously if bleeding worsens, this will need to be held once again.  Hypertension difficult to control - Challenging blood pressure control.  With her chronic kidney disease stage IV makes it more challenging.  Stopping metoprolol which should have little effect.  Hydralazine has been reduced to 10 mg 3 times daily.  We are trying to avoid hypotension given her recent fall.  Hypokalemia - Replating potassium.  Comfortable with discharge.  Close follow-up.  Candee Furbish, MD

## 2020-06-28 NOTE — Telephone Encounter (Signed)
Team Health Report/Call: Caller states that her nurse was there last night. Caller states that her blood pressure is now 150/ which came down from 200/. Caller would like to know how it came down.  Nurse tried to call patient x 2. Patient went to ED and has been admitted.

## 2020-06-28 NOTE — Progress Notes (Signed)
[facility] ([facility])  Hospital Liaison RN note         Notified by TOC manager of patient/family request for [facility] Palliative services at home after discharge.              [facility] Palliative team will follow up with patient after discharge.         Please call with any hospice or palliative related questions.         Thank you for the opportunity to participate in this patient's care.     Chrislyn King, BSN, RN [facility] Hospital Liaison (listed on AMION under Hospice/[facility])    [phone] (24h on call)    

## 2020-06-28 NOTE — Progress Notes (Signed)
Mobility Specialist - Progress Note   06/28/20 1122  Mobility  Activity Ambulated in hall  Level of Assistance Contact guard assist, steadying assist  Assistive Device Front wheel walker  Distance Ambulated (ft) 200 ft  Mobility Response Tolerated well  Mobility performed by Mobility specialist  $Mobility charge 1 Mobility   Pre-mobility, 3L O2: 54 HR, 93% SpO2 Post-mobility, 3L O2: 64 HR, 93% SpO2  Pt c/o soreness "everywhere" when standing and ambulating. Distance limited by fatigue. Pt had poor safety awareness, frequently disregarding instruction; but was min guard throughout. Pt sitting up on edge of bed for lunch, bed alarm on and call bell at side.   Pricilla Handler Mobility Specialist Mobility Specialist Phone: [phone]

## 2020-06-28 NOTE — TOC Progression Note (Signed)
Transition of Care (TOC) - Progression Note  Marvetta Gibbons RN, BSN Transitions of Care Unit 4E- RN Case Manager See Treatment Team for direct phone #    Patient Details  Name: [name] MRN: [id number] Date of Birth: 06/14/1935  Transition of Care [facility]) CM/SW Contact  Dahlia Client, Romeo Rabon, RN Phone Number: 06/28/2020, 12:00 PM  Clinical Narrative:    Per MD pt may d/c later today pending cards clearance, pt refusing SNF and will need HH. MD to place [facility] orders for discharge. CM spoke with pt at bedside. Pt confirms that she does not want to go to SNF for any rehab prefers to return home, reports sister can assist.  Pt is agreeable to [facility] services- discussed [place] choice- list provided Per CMS guidelines from medicare.gov website with star ratings (copy placed in shadow chart)- per pt she would prefer [facility], has also used [facility] in past. Will try [name] first to see if they can accept for needed services.  Also discussed outpt Palliative care referral- pt is also agreeable to phone call f/u for PC needs as outpt- no preference for agency- will made referral to [facility] as [facility] program is currently being re-structured.   Per pt she has RW, cane at home, and sister should be able to transport home.  [facility] made to [facility] for [facility]pt [facility] Care referral- spoke with [facility].   Call made to [place] left for Levada Dy- regarding [facility] referral- pending return call   Expected Discharge Plan: [place] Barriers to Discharge: Barriers Resolved  Expected Discharge Plan and Services Expected Discharge Plan: [place]   Discharge Planning Services: CM Consult Post Acute Care Choice: [place] arrangements for the past 2 months: Single Family Home                 DME Arranged: N/A DME Agency: NA       HH Arranged: PT,OT,Nurse's Aide,Social Work [employer] Agency: [place] Date [place]: 06/28/20 Time [place]: 1159 Representative spoke with at [place]: [place] ([place]) Interventions    Readmission Risk Interventions Readmission Risk Prevention Plan 08/28/2019  Transportation Screening Complete  PCP or Specialist Appt within 5-7 Days Complete  Home Care Screening Complete  Medication Review (RN CM) Complete  Some recent data might be hidden

## 2020-06-28 NOTE — Telephone Encounter (Signed)
Noted. Thx.

## 2020-06-29 ENCOUNTER — Telehealth: Payer: Self-pay

## 2020-06-29 DIAGNOSIS — R55 Syncope and collapse: Secondary | ICD-10-CM | POA: Diagnosis not present

## 2020-06-29 DIAGNOSIS — J438 Other emphysema: Secondary | ICD-10-CM | POA: Diagnosis not present

## 2020-06-29 DIAGNOSIS — J449 Chronic obstructive pulmonary disease, unspecified: Secondary | ICD-10-CM | POA: Diagnosis not present

## 2020-06-29 NOTE — Telephone Encounter (Signed)
Returned the call to the patient. She had recently been hospitalized from a fall and her Metoprolol was discontinued. She is concerned because she feels like her blood pressure was under control. She would like to talk to [name] about this and has been advised that she is off this afternoon.  Her blood pressure today was: 142/52 HR 61  Home health has been ordered but has not been started yet. The patient is concerned because her medications were just reconciled and now they have been changed again. She stated this is why she keeps getting confused.

## 2020-06-29 NOTE — Telephone Encounter (Signed)
Spoke with patient and patient's sister [name] and scheduled an in-person Palliative Consult for 07/14/20 @ 9AM  COVID screening was negative. No pets in home. Patient lives alone. Patient is HOH.   Consent obtained; updated Outlook/Netsmart/Team List and Epic.  Family is aware they may be receiving a call from NP the day before or day of to confirm appointment.

## 2020-06-29 NOTE — Telephone Encounter (Signed)
[facility] is calling requesting to speak with [name] due to her medications changing again due to being hospitalized. Please advise.

## 2020-06-30 ENCOUNTER — Telehealth: Payer: Self-pay | Admitting: Cardiology

## 2020-06-30 ENCOUNTER — Telehealth: Payer: Self-pay | Admitting: Licensed Clinical Social Worker

## 2020-06-30 ENCOUNTER — Telehealth: Payer: Self-pay | Admitting: Internal Medicine

## 2020-06-30 NOTE — Telephone Encounter (Signed)
    Pt c/o medication issue:  1. Name of Medication: All meds  2. How are you currently taking this medication (dosage and times per day)?   3. Are you having a reaction (difficulty breathing--STAT)?   4. What is your medication issue? Pt said she was recently in the hospital and they changed her medications again and doesn't know what to take. She wanted to get a call back from the nurse

## 2020-06-30 NOTE — Telephone Encounter (Signed)
1 Follow up with [facility]; outpt Palliative care referral made- they will follow up with you post discharge 2 Follow up with [place] [place] ([place]); HHPT/OT/aide/SW arranged- they will contact you to set up home visits  Pt called to report that her pill box since leaving the hospital after she had a fall was all messed up. She is wanting to talk with Dr. Jacalyn Lefevre nurse [name].   She is having palliative care come in 07/14/20.   She is still waiting to hear from Home health. She is really anxious to have some help at home. She says that her sister is having dementia symptoms and cannot help her much.   I have asked her to follow up as well with Dr. Judeen Hammans office but her next appt is not until 09/2020.   Will forward to [facility] for review.   Next cardio appt 07/20/20 with Coletta Memos NP.

## 2020-06-30 NOTE — Telephone Encounter (Signed)
Angie w/ [name] is requesting verbals for an OT evaluation. Please advise    Okay to LVM: [phone]

## 2020-06-30 NOTE — Discharge Summary (Addendum)
Physician Discharge Summary  [name] [id number] DOB: 1935/06/25 DOA: 06/26/2020  PCP: Cassandria Anger, MD  Admit date: 06/26/2020 Discharge date: 06/28/2020  Admitted From: Home.  Disposition:  Home.   Recommendations for Outpatient Follow-up:  1. Follow up with PCP in 1-2 weeks 2. Please obtain BMP/CBC in one week 3. Please follow up with cardiology as recommended  4.   Home Health:yes  Discharge Condition:Guarded.  CODE STATUS:full code.  Diet recommendation: Heart Healthy    Brief/Interim Summary: [name] a 85 y.o.femalewith medical history significant ofabdominal pain, heart failure, mural thrombus, CAD status post MI and stent, anxiety, depression, hypertension, CKD 3, COPD, esophageal dysmotility, GERD, hearing loss, A. fib, RLS who presents following a fall at home. CT head which showed large right frontal scalp hematoma but no acute intracranial abnormality and no fracture. CT C-spine which showed no acute abnormality but did demonstrate chronic DDD.  Further evaluation of syncope did not reveal any etiology.  Cardiology consulted, recommended 30 day monitor on discharge.   Discharge Diagnoses:  Principal Problem:   SYNCOPE Active Problems:   Benign essential HTN   Persistent atrial fibrillation (HCC)   COPD with chronic bronchitis (HCC)   GERD   CAD S/P percutaneous coronary angioplasty 08/2019   Dyslipidemia, goal LDL below 70   CKD (chronic kidney disease), stage III (HCC)   Chronic combined systolic and diastolic heart failure (HCC)   Mural thrombus of left atrium 03/09/20   Concussion   Hematoma of frontal scalp   Fall at home with syncope and  right scalp hematoma: - possibly cardiogenic.  Orthostatic vital signs negative.  - telemonitor showing chronic atrial fibrillation. Cardiology recommends ZIO patch to evaluate for arrhythmias.  PT eval recommending SNF/ 24 hour supervision.  - echocardiogram ordered and  reviewed.   Leukocytosis; probably reactive.    CAD s/p Stent placement.  Continue with aspirin .    Elevated troponin/ demand ischemia:  Pt denies any chest pain.     acute on Stage 3 b CKD:  - creatinine at baseline ranging between 1.4 to 2. . d/c torsemide.          Hypothyroidism:  Resume synthroid.     Hypertension;  Better controlled on discharge. Resume home meds.    Chronic atrial fibrillation:  Rate controlled.  Anticoagulation on hold for scalp hematoma for 3 days and resume it on discharge as per cardiology recommendations.  Holding metoprolol.  In view of her co morbidities, falls, deconditioning, debility, palliative care consulted for goals of care.  Recommended outpatient palliative care follow up .    Discharge Instructions  Discharge Instructions    Diet - low sodium heart healthy   Complete by: As directed    Discharge instructions   Complete by: As directed    Please follow up with cardiology as recommended.   Increase activity slowly   Complete by: As directed    No wound care   Complete by: As directed      Allergies as of 06/28/2020      Reactions   Amoxicillin Other (See Comments)   "makes me crazy" Has patient had a PCN reaction causing immediate rash, facial/tongue/throat swelling, SOB or lightheadedness with hypotension: No Has patient had a PCN reaction causing severe rash involving mucus membranes or skin necrosis: No Has patient had a PCN reaction that required hospitalization: No Has patient had a PCN reaction occurring within the last 10 years: No If all of the above answers  are "NO", then may proceed with Cephalosporin use.   Codeine Nausea And Vomiting   Levaquin [levofloxacin In D5w] Nausea And Vomiting   Other Nausea And Vomiting   Pt does not want to take any pain meds      Medication List    STOP taking these medications   acetaminophen 325 MG tablet Commonly known as: TYLENOL   Biotin 1000 MCG  tablet   cyanocobalamin 1000 MCG tablet   hydrocerin Crea   ketoconazole 2 % cream Commonly known as: NIZORAL   metoprolol tartrate 25 MG tablet Commonly known as: LOPRESSOR   nystatin 100000 UNIT/ML suspension Commonly known as: MYCOSTATIN     TAKE these medications   amiodarone 200 MG tablet Commonly known as: PACERONE Take 1 tablet (200 mg total) by mouth daily. What changed:   how much to take  when to take this   apixaban 2.5 MG Tabs tablet Commonly known as: ELIQUIS Take 1 tablet (2.5 mg total) by mouth 2 (two) times daily. Start taking on: July 02, 2020 What changed: These instructions start on July 02, 2020. If you are unsure what to do until then, ask your doctor or other care provider.   Cholecalciferol 25 MCG (1000 UT) capsule Take 1,000 Units by mouth daily.   clonazePAM 0.5 MG tablet Commonly known as: KLONOPIN Take 1 tablet (0.5 mg total) by mouth at bedtime as needed (leg cramps). TAKE 1 TABLET BY MOUTH AT BEDTIME AS NEEDED FOR LEG CRAMPS   docusate sodium 100 MG capsule Commonly known as: COLACE Take 1 capsule (100 mg total) by mouth daily as needed for mild constipation.   hydrALAZINE 50 MG tablet Commonly known as: APRESOLINE Take 1 tablet (50 mg total) by mouth 3 (three) times daily.   isosorbide mononitrate 30 MG 24 hr tablet Commonly known as: IMDUR Take 1 tablet (30 mg total) by mouth daily.   levalbuterol 45 MCG/ACT inhaler Commonly known as: Xopenex HFA Inhale 2 puffs into the lungs every 4 (four) hours as needed for wheezing or shortness of breath.   levothyroxine 25 MCG tablet Commonly known as: SYNTHROID Take 25 mcg by mouth every morning.   megestrol 400 MG/10ML suspension Commonly known as: MEGACE Take 5 mLs (200 mg total) by mouth 2 (two) times daily.   nitroGLYCERIN 0.4 MG SL tablet Commonly known as: Nitrostat Place 1 tablet (0.4 mg total) under the tongue every 5 (five) minutes as needed for chest pain.    pantoprazole 40 MG tablet Commonly known as: PROTONIX Take 1 tablet (40 mg total) by mouth daily.   polyethylene glycol 17 g packet Commonly known as: MIRALAX / GLYCOLAX Take 17 g by mouth daily as needed for mild constipation.   potassium chloride 10 MEQ tablet Commonly known as: KLOR-CON Take 1 tablet (10 mEq total) by mouth daily. What changed: when to take this   rosuvastatin 10 MG tablet Commonly known as: CRESTOR Take 1 tablet (10 mg total) by mouth daily.   Systane Balance 0.6 % Soln Generic drug: Propylene Glycol Place 1 drop into both eyes 2 (two) times daily as needed (dry eyes).   torsemide 20 MG tablet Commonly known as: DEMADEX Take 40 mg (2 tablets) in the morning and 20 mg (one tablet) in the evening   Trelegy Ellipta 100-62.5-25 MCG/INH Aepb Generic drug: Fluticasone-Umeclidin-Vilant Inhale 1 Inhaler into the lungs daily.       Follow-up Information    [facility] up.   Why: outpt Palliative care referral  made- they will follow up with you post discharge  Contact information: [place], [place] Follow up.   Specialty: [place] Why: HHPT/OT/aide/SW arranged- they will contact you to set up home visits Contact information: [place] [address] [phone]              Allergies  Allergen Reactions  . Amoxicillin Other (See Comments)    "makes me crazy" Has patient had a PCN reaction causing immediate rash, facial/tongue/throat swelling, SOB or lightheadedness with hypotension: No Has patient had a PCN reaction causing severe rash involving mucus membranes or skin necrosis: No Has patient had a PCN reaction that required hospitalization: No Has patient had a PCN reaction occurring within the last 10 years: No If all of the above answers are "NO", then may proceed with Cephalosporin use.   . Codeine  Nausea And Vomiting  . Levaquin [Levofloxacin In D5w] Nausea And Vomiting  . Other Nausea And Vomiting    Pt does not want to take any pain meds    Consultations:  Cardiology  Palliative care.    Procedures/Studies: CT Head Wo Contrast  Result Date: 06/26/2020 CLINICAL DATA:  85 year old post fall on blood thinners. Struck head with right forehead bruising. EXAM: CT HEAD WITHOUT CONTRAST TECHNIQUE: Contiguous axial images were obtained from the base of the skull through the vertex without intravenous contrast. COMPARISON:  Head CT 09/15/2019 FINDINGS: Brain: No intracranial hemorrhage, mass effect, or midline shift. Normal for age atrophy. No hydrocephalus. The basilar cisterns are patent. No evidence of territorial infarct or acute ischemia. No extra-axial or intracranial fluid collection. Vascular: Atherosclerosis of skullbase vasculature without hyperdense vessel or abnormal calcification. Skull: No fracture or focal lesion. Sinuses/Orbits: No acute fracture. Chronic opacification of right mastoid air cells. Bilateral cataract resection. Other: Large right frontal scalp hematoma. IMPRESSION: Large right frontal scalp hematoma. No acute intracranial abnormality. No skull fracture. Electronically Signed   By: Keith Rake M.D.   On: 06/26/2020 19:25   CT Cervical Spine Wo Contrast  Result Date: 06/26/2020 CLINICAL DATA:  Neck trauma.  Fall striking head. EXAM: CT CERVICAL SPINE WITHOUT CONTRAST TECHNIQUE: Multidetector CT imaging of the cervical spine was performed without intravenous contrast. Multiplanar CT image reconstructions were also generated. COMPARISON:  None. FINDINGS: Alignment: No traumatic subluxation. Trace degenerative anterolisthesis of C5 on C6. Mild broad-based dextroscoliotic curvature. Skull base and vertebrae: No acute fracture. Vertebral body heights are maintained. The dens and skull base are intact. Occasional lucencies in the cervical spine are felt to be related to  underlying osteopenia/osteoporosis. Bone island within T4 vertebral body. Soft tissues and spinal canal: No prevertebral fluid or swelling. No visible canal hematoma. Disc levels: Multilevel degenerative disc disease, most prominently affecting C6-C7. Multilevel facet hypertrophy. Upper chest: Biapical pleuroparenchymal scarring. Emphysema. No acute apical findings. Other: None. IMPRESSION: 1. No acute fracture or subluxation of the cervical spine. 2. Multilevel degenerative disc disease and facet hypertrophy. Electronically Signed   By: Keith Rake M.D.   On: 06/26/2020 19:28   DG Chest Port 1 View  Result Date: 06/26/2020 CLINICAL DATA:  Fall with nausea, back pain. EXAM: PORTABLE CHEST 1 VIEW COMPARISON:  None. FINDINGS: The heart is enlarged. Vascular calcifications are seen in the aortic arch. The costophrenic angles are blunted, likely reflecting small pleural effusions with associated atelectasis. No pneumothorax. Degenerative changes are seen in the spine. IMPRESSION: 1.  Blunted costophrenic angles likely reflects small bilateral pleural effusions with associated atelectasis. 2. Cardiomegaly. Electronically Signed   By: Zerita Boers M.D.   On: 06/26/2020 18:46   ECHOCARDIOGRAM COMPLETE  Result Date: 06/27/2020    ECHOCARDIOGRAM REPORT   Patient Name:   [name] Date of Exam: 06/27/2020 Medical Rec #:  [id number]     Height:       64.0 in Accession #:    [id number]    Weight:       98.8 lb Date of Birth:  [birth year]     BSA:          1.450 m Patient Age:    85 years      BP:           145/61 mmHg Patient Gender: F             HR:           47 bpm. Exam Location:  Inpatient Procedure: 2D Echo, Cardiac Doppler and Color Doppler Indications:    Syncope  History:        Patient has no prior history of Echocardiogram examinations and                 Patient has prior history of Echocardiogram examinations, most                 recent 03/09/2020. CAD, COPD; Risk Factors:Hypertension.  Sonographer:     Cammy Brochure Referring Phys: [id number] [place]  1. Left ventricular ejection fraction, by estimation, is 55 to 60%. The left ventricle has normal function. The left ventricle has no regional wall motion abnormalities. There is mild left ventricular hypertrophy. Left ventricular diastolic parameters are indeterminate.  2. Right ventricular systolic function is mildly reduced. The right ventricular size is mildly enlarged.  3. Left atrial size was severely dilated.  4. Right atrial size was moderately dilated.  5. The mitral valve is normal in structure. Mild mitral valve regurgitation. No evidence of mitral stenosis.  6. The aortic valve has an indeterminant number of cusps. Aortic valve regurgitation is trivial. No aortic stenosis is present.  7. The inferior vena cava is normal in size with greater than 50% respiratory variability, suggesting right atrial pressure of 3 mmHg. FINDINGS  Left Ventricle: Left ventricular ejection fraction, by estimation, is 55 to 60%. The left ventricle has normal function. The left ventricle has no regional wall motion abnormalities. The left ventricular internal cavity size was normal in size. There is  mild left ventricular hypertrophy. Left ventricular diastolic parameters are indeterminate. Right Ventricle: The right ventricular size is mildly enlarged.Right ventricular systolic function is mildly reduced. Left Atrium: Left atrial size was severely dilated. Right Atrium: Right atrial size was moderately dilated. Pericardium: There is no evidence of pericardial effusion. Mitral Valve: The mitral valve is normal in structure. Mild mitral annular calcification. Mild mitral valve regurgitation. No evidence of mitral valve stenosis. Tricuspid Valve: The tricuspid valve is normal in structure. Tricuspid valve regurgitation is mild . No evidence of tricuspid stenosis. Aortic Valve: The aortic valve has an indeterminant number of cusps. Aortic valve regurgitation  is trivial. No aortic stenosis is present. Aortic valve mean gradient measures 1.0 mmHg. Aortic valve peak gradient measures 1.4 mmHg. Aortic valve area, by VTI measures 3.32 cm. Pulmonic Valve: The pulmonic valve was normal in structure. Pulmonic valve regurgitation is trivial. No evidence of pulmonic stenosis. Aorta: The aortic root is normal in size and  structure. Venous: The inferior vena cava is normal in size with greater than 50% respiratory variability, suggesting right atrial pressure of 3 mmHg. IAS/Shunts: No atrial level shunt detected by color flow Doppler. Additional Comments: There is a small pleural effusion in the left lateral region.  LEFT VENTRICLE PLAX 2D LVIDd:         4.20 cm LVIDs:         2.65 cm LV PW:         1.30 cm LV IVS:        1.10 cm LVOT diam:     2.10 cm LV SV:         49 LV SV Index:   34 LVOT Area:     3.46 cm  LV Volumes (MOD) LV vol d, MOD A2C: 77.3 ml LV vol d, MOD A4C: 75.9 ml LV vol s, MOD A2C: 33.4 ml LV vol s, MOD A4C: 33.2 ml LV SV MOD A2C:     43.9 ml LV SV MOD A4C:     75.9 ml LV SV MOD BP:      44.0 ml RIGHT VENTRICLE RV Basal diam:  3.90 cm RV S prime:     9.03 cm/s TAPSE (M-mode): 2.0 cm LEFT ATRIUM           Index       RIGHT ATRIUM           Index LA diam:      4.20 cm 2.90 cm/m  RA Area:     19.50 cm LA Vol (A4C): 77.7 ml 53.60 ml/m RA Volume:   60.60 ml  41.81 ml/m  AORTIC VALVE AV Area (Vmax):    3.40 cm AV Area (Vmean):   3.29 cm AV Area (VTI):     3.32 cm AV Vmax:           58.80 cm/s AV Vmean:          44.000 cm/s AV VTI:            0.148 m AV Peak Grad:      1.4 mmHg AV Mean Grad:      1.0 mmHg LVOT Vmax:         57.80 cm/s LVOT Vmean:        41.800 cm/s LVOT VTI:          0.142 m LVOT/AV VTI ratio: 0.96  AORTA Ao Root diam: 2.80 cm MITRAL VALVE MV Area (PHT): 3.74 cm    SHUNTS MV Decel Time: 203 msec    Systemic VTI:  0.14 m MV E velocity: 71.10 cm/s  Systemic Diam: 2.10 cm Kirk Ruths MD Electronically signed by Kirk Ruths MD Signature  Date/Time: 06/27/2020/11:39:19 AM    Final        Subjective:  No new complaints.  Discharge Exam: Vitals:   06/28/20 0358 06/28/20 0758  BP: (!) 177/68 (!) 170/57  Pulse: 65 73  Resp: 11 12  Temp: 98.4 F (36.9 C) 98.3 F (36.8 C)  SpO2: 94% 94%   Vitals:   06/27/20 1910 06/28/20 0042 06/28/20 0358 06/28/20 0758  BP: (!) 154/57 (!) 187/64 (!) 177/68 (!) 170/57  Pulse: 67 68 65 73  Resp: 19 (!) '22 11 12  '$ Temp: 98.2 F (36.8 C) 98.2 F (36.8 C) 98.4 F (36.9 C) 98.3 F (36.8 C)  TempSrc: Oral Oral Oral Oral  SpO2: 99% 95% 94% 94%  Weight:      Height:  General: Pt is alert, awake, not in acute distress Cardiovascular: RRR, S1/S2 +, no rubs, no gallops Respiratory: CTA bilaterally, no wheezing, no rhonchi Abdominal: Soft, NT, ND, bowel sounds + Extremities: no edema, no cyanosis    The results of significant diagnostics from this hospitalization (including imaging, microbiology, ancillary and laboratory) are listed below for reference.     Microbiology: Recent Results (from the past 240 hour(s))  SARS CORONAVIRUS 2 (TAT 6-24 HRS) Nasopharyngeal Nasopharyngeal Swab     Status: None   Collection Time: 06/26/20  9:33 PM   Specimen: Nasopharyngeal Swab  Result Value Ref Range Status   SARS Coronavirus 2 NEGATIVE NEGATIVE Final    Comment: (NOTE) SARS-CoV-2 target nucleic acids are NOT DETECTED.  The SARS-CoV-2 RNA is generally detectable in upper and lower respiratory specimens during the acute phase of infection. Negative results do not preclude SARS-CoV-2 infection, do not rule out co-infections with other pathogens, and should not be used as the sole basis for treatment or other patient management decisions. Negative results must be combined with clinical observations, patient history, and epidemiological information. The expected result is Negative.  Fact Sheet for Patients: SugarRoll.be  Fact Sheet for Healthcare  Providers: https://www.woods-mathews.com/  This test is not yet approved or cleared by the [place] FDA and  has been authorized for detection and/or diagnosis of SARS-CoV-2 by FDA under an Emergency Use Authorization (EUA). This EUA will remain  in effect (meaning this test can be used) for the duration of the COVID-19 declaration under Se ction 564(b)(1) of the Act, 21 U.S.C. section 360bbb-3(b)(1), unless the authorization is terminated or revoked sooner.  Performed at [facility], [place] [address], [place], [place] [address]      Labs: BNP (last 3 results) Recent Labs    03/08/20 1900 03/12/20 1012 03/13/20 0759  BNP 3,154.1* 2,943.6* [id number]*   Basic Metabolic Panel: Recent Labs  Lab 06/25/20 1407 06/26/20 0004 06/26/20 1830 06/26/20 2133 06/27/20 0556 06/28/20 0157  NA 139 132* 134*  --  136 134*  K 4.5 4.1 4.7  --  3.7 3.7  CL 100 93* 95*  --  98 96*  CO2 '22 28 28  '$ --  27 29  GLUCOSE 103* 91 127*  --  75 83  BUN 18 28* 29*  --  28* 31*  CREATININE 1.44* 1.96* 1.92*  --  2.01* 2.11*  CALCIUM 9.3 9.5 9.6  --  8.7* 8.4*  MG  --   --   --  1.8  --   --    Liver Function Tests: Recent Labs  Lab 06/26/20 1830 06/27/20 0556  AST 71* 47*  ALT 28 23  ALKPHOS 56 44  BILITOT 0.7 1.0  PROT 6.6 5.1*  ALBUMIN 3.2* 2.5*   No results for input(s): LIPASE, AMYLASE in the last 168 hours. No results for input(s): AMMONIA in the last 168 hours. CBC: Recent Labs  Lab 06/23/20 2308 06/26/20 0004 06/26/20 1830 06/27/20 0556 06/28/20 0157  WBC 10.0 10.9* 15.4* 11.1* 11.4*  NEUTROABS 6.7 7.9* 13.2*  --  8.2*  HGB 12.5 12.3 12.5 10.0* 10.6*  HCT 39.0 38.0 39.0 31.2* 33.4*  MCV 83.3 83.0 83.3 83.6 84.6  PLT 294 325 273 229 241   Cardiac Enzymes: No results for input(s): CKTOTAL, CKMB, CKMBINDEX, TROPONINI in the last 168 hours. BNP: Invalid input(s): POCBNP CBG: Recent Labs  Lab 06/26/20 1831 06/28/20 0617  GLUCAP 127* 89    D-Dimer No results for input(s): DDIMER  in the last 72 hours. Hgb A1c No results for input(s): HGBA1C in the last 72 hours. Lipid Profile No results for input(s): CHOL, HDL, LDLCALC, TRIG, CHOLHDL, LDLDIRECT in the last 72 hours. Thyroid function studies No results for input(s): TSH, T4TOTAL, T3FREE, THYROIDAB in the last 72 hours.  Invalid input(s): FREET3 Anemia work up No results for input(s): VITAMINB12, FOLATE, FERRITIN, TIBC, IRON, RETICCTPCT in the last 72 hours. Urinalysis    Component Value Date/Time   COLORURINE STRAW (A) 09/15/2019 2045   APPEARANCEUR CLEAR 09/15/2019 2045   LABSPEC 1.005 09/15/2019 2045   PHURINE 7.0 09/15/2019 2045   GLUCOSEU NEGATIVE 09/15/2019 2045   GLUCOSEU NEGATIVE 08/23/2016 1349   HGBUR NEGATIVE 09/15/2019 2045   BILIRUBINUR NEGATIVE 09/15/2019 2045   BILIRUBINUR negative 08/10/2016 1538   KETONESUR NEGATIVE 09/15/2019 2045   PROTEINUR 100 (A) 09/15/2019 2045   UROBILINOGEN 0.2 09/29/2016 1155   NITRITE NEGATIVE 09/15/2019 2045   LEUKOCYTESUR NEGATIVE 09/15/2019 2045   Sepsis Labs Invalid input(s): PROCALCITONIN,  WBC,  LACTICIDVEN Microbiology Recent Results (from the past 240 hour(s))  SARS CORONAVIRUS 2 (TAT 6-24 HRS) Nasopharyngeal Nasopharyngeal Swab     Status: None   Collection Time: 06/26/20  9:33 PM   Specimen: Nasopharyngeal Swab  Result Value Ref Range Status   SARS Coronavirus 2 NEGATIVE NEGATIVE Final    Comment: (NOTE) SARS-CoV-2 target nucleic acids are NOT DETECTED.  The SARS-CoV-2 RNA is generally detectable in upper and lower respiratory specimens during the acute phase of infection. Negative results do not preclude SARS-CoV-2 infection, do not rule out co-infections with other pathogens, and should not be used as the sole basis for treatment or other patient management decisions. Negative results must be combined with clinical observations, patient history, and epidemiological information. The expected result  is Negative.  Fact Sheet for Patients: SugarRoll.be  Fact Sheet for Healthcare Providers: https://www.woods-mathews.com/  This test is not yet approved or cleared by the [place] FDA and  has been authorized for detection and/or diagnosis of SARS-CoV-2 by FDA under an Emergency Use Authorization (EUA). This EUA will remain  in effect (meaning this test can be used) for the duration of the COVID-19 declaration under Se ction 564(b)(1) of the Act, 21 U.S.C. section 360bbb-3(b)(1), unless the authorization is terminated or revoked sooner.  Performed at [facility], [place] [address], [place], [place] [address]      Time coordinating discharge: [place].   SIGNED:   Hosie Poisson, MD  [facility] 06/30/2020, 8:08 PM

## 2020-06-30 NOTE — Telephone Encounter (Signed)
Contacted for recommendations for medication management assistance by [name], RN. Pt having difficulty with taking medications and even more so since leaving the hospital after recent fall. My recommendations include: pill packs/bubble packs from local pharmacy, referral to [facility] care management, having pt come to office via [facility] as needed to arrange pill boxes, and mail order pharmacy program. I have also emailed [employer] on Aging to see if they have any recommendations.   Pt will have [place] and palliative starting in the next few weeks too who may be able to assist with this. If I hear any additional recs from LCSW team then I will update.   Westley Hummer, MSW, [place]  [phone]

## 2020-06-30 NOTE — Telephone Encounter (Signed)
Spoke with pt, she is very confused about her medications and reports her bp is 187. Patient voiced understanding to not take the metoprolol anymore. She has the hydralazine 50 mg on hand and will take 1/2 tablet now. She has no way to get to the pharmacy. Will call the patient back this afternoon.

## 2020-06-30 NOTE — Telephone Encounter (Signed)
Patient voiced understanding of the evening medications she needs to take. Questions regarding monitor answered. She will call in the morning to discuss her medications if she needs to.

## 2020-07-01 ENCOUNTER — Telehealth: Payer: Self-pay | Admitting: Internal Medicine

## 2020-07-01 ENCOUNTER — Telehealth: Payer: Self-pay | Admitting: Licensed Clinical Social Worker

## 2020-07-01 MED ORDER — HYDRALAZINE HCL 50 MG PO TABS: 25.0000 mg | ORAL_TABLET | Freq: Three times a day (TID) | ORAL | 3 refills | Status: AC

## 2020-07-01 MED ORDER — ROSUVASTATIN CALCIUM 10 MG PO TABS: 10.0000 mg | ORAL_TABLET | Freq: Every day | ORAL | 3 refills | Status: AC

## 2020-07-01 NOTE — Telephone Encounter (Signed)
Spoke with pt, medications for today discussed with the patient. Refill sent to [facility] as they will deliver her medications to her.

## 2020-07-01 NOTE — Progress Notes (Signed)
Heart and Vascular Care Navigation  07/01/2020  [name] June 17, 1935 [id number]  Reason for Referral:  Engaged with patient by telephone for initial visit for Heart and Vascular Care Coordination.                                                                                                   Assessment:     LCSW spoke with pt this morning, was able to reach her via telephone at [phone]. I introduced self, role, reason for call. Pt confirmed home address, PCP and emergency contact. She lives home alone, she has an 71 yo sister that lives across town and checks in on her 3x a week however she is limited in her mobility/cognition per pt report. LCSW inquired about any additional family/extended family. 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   Pt currently has oxygen and had a fall over the cord. Since that hospitalization she is mobile but shares she is very sore due to broken ribs/bruising. We discussed that [facility] had been consulted as had Palliative w/ [facility]. I encouraged pt to work with those teams to the best of her ability. Pt shares she is open to any assistance that she can receive at this time. She has food in the home, her sister helps with getting that for her. She had previously been referred for food assistance through [employer] [facility] [employer] [facility], unclear if that had been processed or if pt was not eligible. She has been enrolled in [employer] which I reminded her she could utilize to get to her appointments here at [facility] and with PCP. Pt currently  states she is "not going anywhere" and would like for me to see about changing her appointment here at the office in May.  I shared w/ pt that I had reached out to [employer] and they had offered for a [employer] reach out to her with any additional resources and referrals. Pt agreeable, and I let her know that if at any time she was not interested in services to let them know.                                   HRT/VAS Care Coordination    Patients Home Cardiology Office [place] Team Social Worker   Social Worker Name: [name], [phone]   Living arrangements for the past 2 months Apartment   Lives with: Self   Patient Current Insurance Coverage Managed Medicare   Patient Has Concern  With Paying Medical Bills No   Does Patient Have Prescription Coverage? Yes   Home Assistive Devices/Equipment Oxygen   DME Agency NA   [place]   Current home services DME      Social History:                                                                             SDOH Screenings   Alcohol Screen: Not on file  Depression (PHQ2-9): Medium Risk  . PHQ-2 Score: 11  Financial Resource Strain: High Risk  . Difficulty of Paying Living Expenses: Very hard  Food Insecurity: Food Insecurity Present  . Worried About Charity fundraiser in the Last Year: Sometimes true  . Ran Out of Food in the Last Year: Sometimes true  Housing: Low Risk   . Last Housing Risk Score: 0  Physical Activity: Unknown  . Days of Exercise per Week: Patient refused  . Minutes of Exercise per Session: Not on file  Social Connections: Not on file  Stress: No Stress Concern Present  . Feeling of Stress : Not at all  Tobacco Use: Medium Risk  . Smoking Tobacco Use: Former Smoker  . Smokeless Tobacco Use: Never Used  Transportation Needs: No Transportation Needs  . Lack of Transportation (Medical): No  .  Lack of Transportation (Non-Medical): No    SDOH Interventions: Financial Resources:  Financial Strain Interventions: Other (Comment) (assistance w/ food, referral to [employer]- pt on fixed income currently)  Food Insecurity:  Food Insecurity Interventions: Other (Comment) ([employer] referral made, inquired about SNAP)  Housing Insecurity:     Transportation:   Transportation Interventions: [employer] (pt has ben enrolled in [employer] reminded her that was available and we could assist with scheduling that as needed.)    Follow-up plan:   LCSW mailed pt my card and information about [employer] which can help answer questions for pt regarding resources. I sent referral to E[place] at [facility] for [employer] to contact pt. I also sent an email to [employer] [facility] [place] regarding food assistance w/ [employer]. I also sent a referral in to [place] program for [employer] which will be delivered to pt home directly. I will send a message to [name] regarding pt request for appt change to clarify the process for that. LCSW will f/u next week to see if pt has received any resources/for general check in.

## 2020-07-01 NOTE — Telephone Encounter (Signed)
Sean a physical therapist with [facility] calling, states that he called the patient last night to scheduled the start of care visit and the patient refused because she has rib pain and states we can try again next week. Hilliard Clark will be calling around 05.04.22 to try again.

## 2020-07-02 ENCOUNTER — Telehealth: Payer: Self-pay | Admitting: Internal Medicine

## 2020-07-02 ENCOUNTER — Telehealth: Payer: Self-pay | Admitting: Licensed Clinical Social Worker

## 2020-07-02 ENCOUNTER — Telehealth: Payer: Self-pay | Admitting: Cardiology

## 2020-07-02 NOTE — Telephone Encounter (Signed)
Noted. Thanks.

## 2020-07-02 NOTE — Telephone Encounter (Signed)
I have made contact with the pt. She stated that she did not want to go to the ED. At first she stated that she did not have a ride and was unable to go. I offered to call a family member or someone that could come take her if she provided contact info. She declined. I attempted to persuade her again but she continued to decline help now stating that the reason is because she is on a 21f cord that is hooked up to continuous O2 that she can not go without. I offered to call EMS so they could provide the O2 during the trip to the ED for evaluation. She declined and stated that she did not want to go and she would stay home to hoping that it would come down.

## 2020-07-02 NOTE — Telephone Encounter (Signed)
LCSW received call back from [employer], Museum/gallery conservator with [employer]. She has reached out to pt and just was seeking clarity to what pt needs are. She also left recommendation for pill packs on voicemail. LCSW called her back at, unable to reach her this morning, and left message at [phone]. I let her know that pt unfortunately has not been able to utilize pill packs due to variability of medications. LCSW requested any additional resources for food and medication management until other services can be in place.   Westley Hummer, MSW, [place]  [phone]

## 2020-07-02 NOTE — Telephone Encounter (Signed)
Pt c/o medication issue:  1. Name of Medication: metoprolol  2. How are you currently taking this medication (dosage and times per day)? Not taking  3. Are you having a reaction (difficulty breathing--STAT)? no  4. What is your medication issue? Patient states she was told by the hospital to stop taking the medication and now her BP is high. She states she busted her head open 3 days ago and went to the hospital. She states she does not know why the stopped it, because her BP was perfect. She states her BP was 190/something. tates BP .

## 2020-07-02 NOTE — Telephone Encounter (Signed)
Spoke with pt, she is concerned about her bp being high. She has not taken her evening medications. She was given the okay to take the 50 mg of the hydralazine tonight to help with her bp.

## 2020-07-02 NOTE — Telephone Encounter (Signed)
[facility] nurse called over and stated they advised patient to go to ED and she has refused.   Please follow up with patient.

## 2020-07-02 NOTE — Telephone Encounter (Signed)
Okay.  Thanks.

## 2020-07-02 NOTE — Telephone Encounter (Signed)
Patient called and said that her top BP number was 192. Transferred to [facility]

## 2020-07-05 ENCOUNTER — Telehealth: Payer: Self-pay | Admitting: Licensed Clinical Social Worker

## 2020-07-05 ENCOUNTER — Telehealth: Payer: Self-pay | Admitting: Physician Assistant

## 2020-07-05 NOTE — Telephone Encounter (Signed)
Called angie there was no answer LMOM w/MD response.Marland KitchenJohny Chess

## 2020-07-05 NOTE — Telephone Encounter (Signed)
LCSW received call back from [facility] worker. Unfortunately they do not have any additional resources other than those recommended by this Probation officer. I will attempt to reach out to palliative/HH to see if they could possibly begin other services sooner and just delay the therapy as requested.   Westley Hummer, MSW, [place]  [phone]

## 2020-07-05 NOTE — Telephone Encounter (Signed)
Call received from [employer].   Pt had first occurrence of atrial flutter at 5:33 AM today, HR 77, lasted for 90 sec. Auto-triggered, asymptomatic. Strips have been uploaded to [employer].

## 2020-07-07 ENCOUNTER — Telehealth: Payer: Self-pay | Admitting: Cardiology

## 2020-07-07 DIAGNOSIS — S060X9D Concussion with loss of consciousness of unspecified duration, subsequent encounter: Secondary | ICD-10-CM | POA: Diagnosis not present

## 2020-07-07 DIAGNOSIS — N183 Chronic kidney disease, stage 3 unspecified: Secondary | ICD-10-CM | POA: Diagnosis not present

## 2020-07-07 DIAGNOSIS — S0181XD Laceration without foreign body of other part of head, subsequent encounter: Secondary | ICD-10-CM | POA: Diagnosis not present

## 2020-07-07 DIAGNOSIS — I5042 Chronic combined systolic (congestive) and diastolic (congestive) heart failure: Secondary | ICD-10-CM | POA: Diagnosis not present

## 2020-07-07 DIAGNOSIS — S0003XD Contusion of scalp, subsequent encounter: Secondary | ICD-10-CM | POA: Diagnosis not present

## 2020-07-07 DIAGNOSIS — I130 Hypertensive heart and chronic kidney disease with heart failure and stage 1 through stage 4 chronic kidney disease, or unspecified chronic kidney disease: Secondary | ICD-10-CM | POA: Diagnosis not present

## 2020-07-07 NOTE — Telephone Encounter (Signed)
Pt states that she does not "feel good". She states she fell on her oxygen cord on 06/26/20. She reports the she is still hurting in her head and ribs. Pt has not taken anything for pain at this time but is going to take a Tylenol. She c/o of being SOB. Denies being dizzy, and chest pain. Pt reports taking meds a prescribed. She took today's meds at 8 am. Please advise.

## 2020-07-07 NOTE — Telephone Encounter (Signed)
Spoke with pt, she is going to try the heating pad to her broken ribs and she is taking the tylenol, which does help her headache. She has a follow up appointment 5/17 but she is not sure she will come because of the way she looks. Encouraged patient to come to the appointment as she needs to be seen. Patient voiced understanding

## 2020-07-07 NOTE — Telephone Encounter (Signed)
Pt c/o BP issue: STAT if pt c/o blurred vision, one-sided weakness or slurred speech  1. What are your last 5 BP readings? 205 patient dose not remember bottom number fr that one. Now it 187/75  2. Are you having any other symptoms (ex. Dizziness, headache, blurred vision, passed out)? Patient hit her head in the hospital last week and states she has blurred vision.   3. What is your BP issue? Patient seems to think her BP medication is not working.

## 2020-07-08 ENCOUNTER — Telehealth: Payer: Self-pay | Admitting: Cardiology

## 2020-07-08 NOTE — Telephone Encounter (Signed)
Spoke with pt, she is still having pain in her ribs but the heating pad is helping. She rechecked her bp while on the phone with me and it was lower, 170. She is due to take her evening hydralazine after dinner. She reports some nausea, encouraged her to take her medications after eating to see if that helps. She reports they delivered food to her yesterday so she is good with food and is eating. She reports the home health nurse is coming out tomorrow. She confirms she is taking her medicine as she is supposed to. Explained to patient as long as she is in pain her bp may run a little higher.

## 2020-07-08 NOTE — Telephone Encounter (Signed)
Pt c/o BP issue: STAT if pt c/o blurred vision, one-sided weakness or slurred speech  1. What are your last 5 BP readings?  0000000 know diastolic [id number] PM  [id number] HR 70 while otp  2. Are you having any other symptoms (ex. Dizziness, headache, blurred vision, passed out)? No   3. What is your BP issue? Having hypertension unsure the reason why. States Dr. Stanford Breed put her on new medicine so it may be due to that. Tried to take an additional reading while otp, BP cuff read error. Attempted a second time while on the line and it came back 180/93 HR 70. Please advise.

## 2020-07-09 ENCOUNTER — Telehealth: Payer: Self-pay | Admitting: Internal Medicine

## 2020-07-09 DIAGNOSIS — I5042 Chronic combined systolic (congestive) and diastolic (congestive) heart failure: Secondary | ICD-10-CM | POA: Diagnosis not present

## 2020-07-09 DIAGNOSIS — N183 Chronic kidney disease, stage 3 unspecified: Secondary | ICD-10-CM | POA: Diagnosis not present

## 2020-07-09 DIAGNOSIS — I130 Hypertensive heart and chronic kidney disease with heart failure and stage 1 through stage 4 chronic kidney disease, or unspecified chronic kidney disease: Secondary | ICD-10-CM | POA: Diagnosis not present

## 2020-07-09 DIAGNOSIS — S0003XD Contusion of scalp, subsequent encounter: Secondary | ICD-10-CM | POA: Diagnosis not present

## 2020-07-09 DIAGNOSIS — S060X9D Concussion with loss of consciousness of unspecified duration, subsequent encounter: Secondary | ICD-10-CM | POA: Diagnosis not present

## 2020-07-09 DIAGNOSIS — S0181XD Laceration without foreign body of other part of head, subsequent encounter: Secondary | ICD-10-CM | POA: Diagnosis not present

## 2020-07-09 NOTE — Telephone Encounter (Signed)
See previous note.  Called [name] & pt.  Pt advised to go to ED, pt refuses.  Pt advised to call 911 if symptoms get worse.  Pt also urged to make OV with PCP for Monday & pt refuses; states she has no one to take her & does not want RN to call sister.  RN will call on Monday to check on pt.

## 2020-07-09 NOTE — Telephone Encounter (Signed)
Noted. Thx.

## 2020-07-09 NOTE — Telephone Encounter (Signed)
[facility]--[phone]  Large hematoma on her forehead above right eye Bruises from eye socket to chin in various stages of healing  Last night sharp pain shooting through head  Now she has blurred vision and nausea  Has 10/10 pain in head. RN made her take a tylenol...[facility] stated that helped the pain and the nausea just a little  Not sure if the nausea is pain related or something else  Can't go to MD because she doesn't have an oxygen concentrater. Or transportation, refuses ER   RN asking for a social work evaluation and   RN visits 3 times next week 2 times the following week  Once weekly after

## 2020-07-12 ENCOUNTER — Telehealth: Payer: Self-pay | Admitting: Internal Medicine

## 2020-07-12 NOTE — Telephone Encounter (Signed)
Upon review of chart, DME from [facility] completed & faxed to [place] on 04/12/20.   Phone Encounter on 04/12/20 states "Patient states she was told the only way for her to get the portable oxygen is to go to the [place] and fill out paperwork. She states that is the whole reason for the portable oxygen, because she cannot get there." Attempted portable eval for patient noted that pt does not qualify for a POC; only for best fit eval, but it is too heavy & pt would like something less than 2lbs.    Per note on 04/13/20: "She's worried about her O2. She stated that cardiology is trying to get her setup with [employer] but she needs to come in and fill out paperwork. She is not feeling well for this."  Will route to cardiology to determine if this was completed for the patient.

## 2020-07-12 NOTE — Telephone Encounter (Signed)
Pt states she feels much worse; states when she moves she feels SOB.  States [facility] RN scheduled to visit today. Pt & sister decline OV; states that pt is unable to come in b/c she does not have portable O2 & believes this will help in being about to get to appts.  Pt states she fell previously with injury b/c the O2 cord is too long.

## 2020-07-12 NOTE — Telephone Encounter (Signed)
Liji a physical therapist with [facility] calling, requesting verbal orders for [place] PT 1 w 1, 2 w 4, and 1 w 3. She would also like a nursing eval.  Liji- [phone] Okay to lvm

## 2020-07-13 ENCOUNTER — Telehealth: Payer: Self-pay | Admitting: Internal Medicine

## 2020-07-13 ENCOUNTER — Other Ambulatory Visit: Payer: Self-pay

## 2020-07-13 ENCOUNTER — Encounter (HOSPITAL_COMMUNITY): Payer: Self-pay

## 2020-07-13 ENCOUNTER — Inpatient Hospital Stay (HOSPITAL_COMMUNITY)
Admission: EM | Admit: 2020-07-13 | Discharge: 2020-08-04 | DRG: 640 | Disposition: E | Payer: Medicare Other | Attending: Internal Medicine | Admitting: Internal Medicine

## 2020-07-13 ENCOUNTER — Emergency Department (HOSPITAL_COMMUNITY): Payer: Medicare Other

## 2020-07-13 DIAGNOSIS — J9621 Acute and chronic respiratory failure with hypoxia: Secondary | ICD-10-CM

## 2020-07-13 DIAGNOSIS — I480 Paroxysmal atrial fibrillation: Secondary | ICD-10-CM

## 2020-07-13 DIAGNOSIS — J449 Chronic obstructive pulmonary disease, unspecified: Secondary | ICD-10-CM

## 2020-07-13 DIAGNOSIS — E785 Hyperlipidemia, unspecified: Secondary | ICD-10-CM

## 2020-07-13 DIAGNOSIS — F419 Anxiety disorder, unspecified: Secondary | ICD-10-CM

## 2020-07-13 DIAGNOSIS — I5033 Acute on chronic diastolic (congestive) heart failure: Secondary | ICD-10-CM

## 2020-07-13 DIAGNOSIS — M79673 Pain in unspecified foot: Secondary | ICD-10-CM

## 2020-07-13 DIAGNOSIS — E871 Hypo-osmolality and hyponatremia: Principal | ICD-10-CM | POA: Insufficient documentation

## 2020-07-13 DIAGNOSIS — R0602 Shortness of breath: Secondary | ICD-10-CM

## 2020-07-13 DIAGNOSIS — E039 Hypothyroidism, unspecified: Secondary | ICD-10-CM

## 2020-07-13 DIAGNOSIS — E440 Moderate protein-calorie malnutrition: Secondary | ICD-10-CM | POA: Insufficient documentation

## 2020-07-13 DIAGNOSIS — S60221A Contusion of right hand, initial encounter: Secondary | ICD-10-CM

## 2020-07-13 DIAGNOSIS — W19XXXA Unspecified fall, initial encounter: Secondary | ICD-10-CM

## 2020-07-13 DIAGNOSIS — I5022 Chronic systolic (congestive) heart failure: Secondary | ICD-10-CM

## 2020-07-13 DIAGNOSIS — R531 Weakness: Secondary | ICD-10-CM

## 2020-07-13 DIAGNOSIS — E860 Dehydration: Secondary | ICD-10-CM | POA: Diagnosis present

## 2020-07-13 DIAGNOSIS — Z515 Encounter for palliative care: Secondary | ICD-10-CM | POA: Diagnosis not present

## 2020-07-13 DIAGNOSIS — R0902 Hypoxemia: Secondary | ICD-10-CM | POA: Diagnosis not present

## 2020-07-13 DIAGNOSIS — S62604A Fracture of unspecified phalanx of right ring finger, initial encounter for closed fracture: Secondary | ICD-10-CM | POA: Diagnosis not present

## 2020-07-13 DIAGNOSIS — K219 Gastro-esophageal reflux disease without esophagitis: Secondary | ICD-10-CM | POA: Diagnosis present

## 2020-07-13 DIAGNOSIS — Z66 Do not resuscitate: Secondary | ICD-10-CM | POA: Diagnosis not present

## 2020-07-13 DIAGNOSIS — S41111A Laceration without foreign body of right upper arm, initial encounter: Secondary | ICD-10-CM | POA: Diagnosis present

## 2020-07-13 DIAGNOSIS — N184 Chronic kidney disease, stage 4 (severe): Secondary | ICD-10-CM | POA: Diagnosis present

## 2020-07-13 DIAGNOSIS — W19XXXD Unspecified fall, subsequent encounter: Secondary | ICD-10-CM | POA: Diagnosis not present

## 2020-07-13 DIAGNOSIS — Y92009 Unspecified place in unspecified non-institutional (private) residence as the place of occurrence of the external cause: Secondary | ICD-10-CM

## 2020-07-13 DIAGNOSIS — R519 Headache, unspecified: Secondary | ICD-10-CM | POA: Diagnosis not present

## 2020-07-13 DIAGNOSIS — Z9889 Other specified postprocedural states: Secondary | ICD-10-CM | POA: Diagnosis not present

## 2020-07-13 DIAGNOSIS — I272 Pulmonary hypertension, unspecified: Secondary | ICD-10-CM | POA: Diagnosis present

## 2020-07-13 DIAGNOSIS — Z79899 Other long term (current) drug therapy: Secondary | ICD-10-CM

## 2020-07-13 DIAGNOSIS — W010XXA Fall on same level from slipping, tripping and stumbling without subsequent striking against object, initial encounter: Secondary | ICD-10-CM | POA: Diagnosis present

## 2020-07-13 DIAGNOSIS — I5031 Acute diastolic (congestive) heart failure: Secondary | ICD-10-CM | POA: Diagnosis not present

## 2020-07-13 DIAGNOSIS — I4891 Unspecified atrial fibrillation: Secondary | ICD-10-CM | POA: Diagnosis not present

## 2020-07-13 DIAGNOSIS — J432 Centrilobular emphysema: Secondary | ICD-10-CM | POA: Diagnosis not present

## 2020-07-13 DIAGNOSIS — R58 Hemorrhage, not elsewhere classified: Secondary | ICD-10-CM | POA: Diagnosis not present

## 2020-07-13 DIAGNOSIS — M7989 Other specified soft tissue disorders: Secondary | ICD-10-CM | POA: Diagnosis not present

## 2020-07-13 DIAGNOSIS — I130 Hypertensive heart and chronic kidney disease with heart failure and stage 1 through stage 4 chronic kidney disease, or unspecified chronic kidney disease: Secondary | ICD-10-CM | POA: Diagnosis present

## 2020-07-13 DIAGNOSIS — S0181XD Laceration without foreign body of other part of head, subsequent encounter: Secondary | ICD-10-CM | POA: Diagnosis not present

## 2020-07-13 DIAGNOSIS — I358 Other nonrheumatic aortic valve disorders: Secondary | ICD-10-CM | POA: Diagnosis not present

## 2020-07-13 DIAGNOSIS — S060X9D Concussion with loss of consciousness of unspecified duration, subsequent encounter: Secondary | ICD-10-CM | POA: Diagnosis not present

## 2020-07-13 DIAGNOSIS — I482 Chronic atrial fibrillation, unspecified: Secondary | ICD-10-CM | POA: Diagnosis present

## 2020-07-13 DIAGNOSIS — Z955 Presence of coronary angioplasty implant and graft: Secondary | ICD-10-CM

## 2020-07-13 DIAGNOSIS — Z20822 Contact with and (suspected) exposure to covid-19: Secondary | ICD-10-CM | POA: Diagnosis not present

## 2020-07-13 DIAGNOSIS — N183 Chronic kidney disease, stage 3 unspecified: Secondary | ICD-10-CM | POA: Diagnosis not present

## 2020-07-13 DIAGNOSIS — Z7901 Long term (current) use of anticoagulants: Secondary | ICD-10-CM | POA: Diagnosis not present

## 2020-07-13 DIAGNOSIS — M7732 Calcaneal spur, left foot: Secondary | ICD-10-CM | POA: Diagnosis not present

## 2020-07-13 DIAGNOSIS — I252 Old myocardial infarction: Secondary | ICD-10-CM

## 2020-07-13 DIAGNOSIS — J90 Pleural effusion, not elsewhere classified: Secondary | ICD-10-CM | POA: Diagnosis not present

## 2020-07-13 DIAGNOSIS — Z79890 Hormone replacement therapy (postmenopausal): Secondary | ICD-10-CM | POA: Diagnosis not present

## 2020-07-13 DIAGNOSIS — R64 Cachexia: Secondary | ICD-10-CM | POA: Diagnosis not present

## 2020-07-13 DIAGNOSIS — J9690 Respiratory failure, unspecified, unspecified whether with hypoxia or hypercapnia: Secondary | ICD-10-CM | POA: Diagnosis not present

## 2020-07-13 DIAGNOSIS — I5042 Chronic combined systolic (congestive) and diastolic (congestive) heart failure: Secondary | ICD-10-CM | POA: Diagnosis not present

## 2020-07-13 DIAGNOSIS — E870 Hyperosmolality and hypernatremia: Principal | ICD-10-CM | POA: Diagnosis present

## 2020-07-13 DIAGNOSIS — Z681 Body mass index (BMI) 19 or less, adult: Secondary | ICD-10-CM

## 2020-07-13 DIAGNOSIS — R296 Repeated falls: Secondary | ICD-10-CM | POA: Diagnosis present

## 2020-07-13 DIAGNOSIS — Z7189 Other specified counseling: Secondary | ICD-10-CM | POA: Diagnosis not present

## 2020-07-13 DIAGNOSIS — E8809 Other disorders of plasma-protein metabolism, not elsewhere classified: Secondary | ICD-10-CM | POA: Diagnosis present

## 2020-07-13 DIAGNOSIS — R0689 Other abnormalities of breathing: Secondary | ICD-10-CM | POA: Diagnosis not present

## 2020-07-13 DIAGNOSIS — I2510 Atherosclerotic heart disease of native coronary artery without angina pectoris: Secondary | ICD-10-CM | POA: Diagnosis not present

## 2020-07-13 DIAGNOSIS — Z743 Need for continuous supervision: Secondary | ICD-10-CM | POA: Diagnosis not present

## 2020-07-13 DIAGNOSIS — J811 Chronic pulmonary edema: Secondary | ICD-10-CM | POA: Diagnosis not present

## 2020-07-13 DIAGNOSIS — I5043 Acute on chronic combined systolic (congestive) and diastolic (congestive) heart failure: Secondary | ICD-10-CM | POA: Diagnosis not present

## 2020-07-13 DIAGNOSIS — R6889 Other general symptoms and signs: Secondary | ICD-10-CM | POA: Diagnosis not present

## 2020-07-13 DIAGNOSIS — E861 Hypovolemia: Secondary | ICD-10-CM | POA: Diagnosis present

## 2020-07-13 DIAGNOSIS — S0003XD Contusion of scalp, subsequent encounter: Secondary | ICD-10-CM | POA: Diagnosis not present

## 2020-07-13 DIAGNOSIS — S62624A Displaced fracture of medial phalanx of right ring finger, initial encounter for closed fracture: Secondary | ICD-10-CM | POA: Diagnosis not present

## 2020-07-13 DIAGNOSIS — J439 Emphysema, unspecified: Secondary | ICD-10-CM | POA: Diagnosis not present

## 2020-07-13 HISTORY — DX: Unspecified atrial fibrillation: I48.91

## 2020-07-13 LAB — URINALYSIS, ROUTINE W REFLEX MICROSCOPIC
Bacteria, UA: NONE SEEN
Bilirubin Urine: NEGATIVE
Glucose, UA: NEGATIVE mg/dL
Hgb urine dipstick: NEGATIVE
Ketones, ur: NEGATIVE mg/dL
Leukocytes,Ua: NEGATIVE
Nitrite: NEGATIVE
Protein, ur: 100 mg/dL — AB
Specific Gravity, Urine: 1.003 — ABNORMAL LOW (ref 1.005–1.030)
pH: 8 (ref 5.0–8.0)

## 2020-07-13 LAB — CBC WITH DIFFERENTIAL/PLATELET
Abs Immature Granulocytes: 0.05 10*3/uL (ref 0.00–0.07)
Basophils Absolute: 0 10*3/uL (ref 0.0–0.1)
Basophils Relative: 0 %
Eosinophils Absolute: 0 10*3/uL (ref 0.0–0.5)
Eosinophils Relative: 0 %
HCT: 34 % — ABNORMAL LOW (ref 36.0–46.0)
Hemoglobin: 11.2 g/dL — ABNORMAL LOW (ref 12.0–15.0)
Immature Granulocytes: 1 %
Lymphocytes Relative: 7 %
Lymphs Abs: 0.7 10*3/uL (ref 0.7–4.0)
MCH: 27.5 pg (ref 26.0–34.0)
MCHC: 32.9 g/dL (ref 30.0–36.0)
MCV: 83.3 fL (ref 80.0–100.0)
Monocytes Absolute: 1.3 10*3/uL — ABNORMAL HIGH (ref 0.1–1.0)
Monocytes Relative: 13 %
Neutro Abs: 7.8 10*3/uL — ABNORMAL HIGH (ref 1.7–7.7)
Neutrophils Relative %: 79 %
Platelets: 339 10*3/uL (ref 150–400)
RBC: 4.08 MIL/uL (ref 3.87–5.11)
RDW: 21.7 % — ABNORMAL HIGH (ref 11.5–15.5)
WBC: 10 10*3/uL (ref 4.0–10.5)
nRBC: 0 % (ref 0.0–0.2)

## 2020-07-13 LAB — RESP PANEL BY RT-PCR (FLU A&B, COVID) ARPGX2
Influenza A by PCR: NEGATIVE
Influenza B by PCR: NEGATIVE
SARS Coronavirus 2 by RT PCR: NEGATIVE

## 2020-07-13 LAB — COMPREHENSIVE METABOLIC PANEL
ALT: 27 U/L (ref 0–44)
AST: 49 U/L — ABNORMAL HIGH (ref 15–41)
Albumin: 2.8 g/dL — ABNORMAL LOW (ref 3.5–5.0)
Alkaline Phosphatase: 69 U/L (ref 38–126)
Anion gap: 13 (ref 5–15)
BUN: 21 mg/dL (ref 8–23)
CO2: 31 mmol/L (ref 22–32)
Calcium: 8.8 mg/dL — ABNORMAL LOW (ref 8.9–10.3)
Chloride: 79 mmol/L — ABNORMAL LOW (ref 98–111)
Creatinine, Ser: 1.62 mg/dL — ABNORMAL HIGH (ref 0.44–1.00)
GFR, Estimated: 32 mL/min — ABNORMAL LOW (ref >60)
Glucose, Bld: 75 mg/dL (ref 70–99)
Potassium: 3.5 mmol/L (ref 3.5–5.1)
Sodium: 123 mmol/L — ABNORMAL LOW (ref 135–145)
Total Bilirubin: 0.8 mg/dL (ref 0.3–1.2)
Total Protein: 6.2 g/dL — ABNORMAL LOW (ref 6.5–8.1)

## 2020-07-13 LAB — SODIUM: Sodium: 128 mmol/L — ABNORMAL LOW (ref 135–145)

## 2020-07-13 LAB — MAGNESIUM: Magnesium: 2.1 mg/dL (ref 1.7–2.4)

## 2020-07-13 LAB — OSMOLALITY, URINE: Osmolality, Ur: 137 mOsm/kg — ABNORMAL LOW (ref 300–900)

## 2020-07-13 LAB — SODIUM, URINE, RANDOM: Sodium, Ur: 26 mmol/L

## 2020-07-13 MED ORDER — MELATONIN 3 MG PO TABS
3.0000 mg | ORAL_TABLET | Freq: Every evening | ORAL | Status: DC | PRN
  Administered 2020-07-14 (×2): 3 mg via ORAL
  Filled 2020-07-13 (×2): qty 1

## 2020-07-13 MED ORDER — ACETAMINOPHEN 325 MG PO TABS
650.0000 mg | ORAL_TABLET | Freq: Four times a day (QID) | ORAL | Status: DC | PRN
  Administered 2020-07-14 – 2020-07-15 (×4): 650 mg via ORAL
  Filled 2020-07-13 (×4): qty 2

## 2020-07-13 MED ORDER — PROCHLORPERAZINE EDISYLATE 10 MG/2ML IJ SOLN: 10.0000 mg | Freq: Four times a day (QID) | INTRAMUSCULAR | Status: DC | PRN

## 2020-07-13 MED ORDER — METOPROLOL TARTRATE 5 MG/5ML IV SOLN
5.0000 mg | Freq: Four times a day (QID) | INTRAVENOUS | Status: DC | PRN
  Administered 2020-07-13 – 2020-07-16 (×5): 5 mg via INTRAVENOUS
  Filled 2020-07-13 (×5): qty 5

## 2020-07-13 MED ORDER — HYDRALAZINE HCL 10 MG PO TABS
10.0000 mg | ORAL_TABLET | Freq: Three times a day (TID) | ORAL | Status: DC
  Administered 2020-07-14 – 2020-07-15 (×5): 10 mg via ORAL
  Filled 2020-07-13 (×5): qty 1

## 2020-07-13 MED ORDER — SODIUM CHLORIDE 0.9 % IV SOLN: Freq: Once | INTRAVENOUS | Status: DC

## 2020-07-13 MED ORDER — SODIUM CHLORIDE 0.9 % IV SOLN: INTRAVENOUS | Status: DC

## 2020-07-13 MED ORDER — SODIUM CHLORIDE 0.9 % IV SOLN: Freq: Once | INTRAVENOUS | Status: AC

## 2020-07-13 NOTE — Telephone Encounter (Signed)
Noted. Thx.

## 2020-07-13 NOTE — Progress Notes (Signed)
   07/24/2020 1745  Clinical Encounter Type  Visited With Patient not available  Visit Type Trauma  Referral From Nurse  Consult/Referral To Chaplain  Chaplain responded to page. The medical team was at the patient's bedside. The nurse informed this chaplain, the patient was downgraded to non-trauma. Chaplain remains available if needed. This note was prepared by Jeanine Luz, M.Div..  For questions please contact by phone [phone].

## 2020-07-13 NOTE — Progress Notes (Signed)
ANTICOAGULATION CONSULT NOTE - Initial Consult  Pharmacy Consult for Eliquis Indication: atrial fibrillation  No Known Allergies  Patient Measurements:    Vital Signs: Temp: 98.4 F (36.9 C) (05/10 2257) Temp Source: Oral (05/10 2257) BP: 171/75 (05/10 2257) Pulse Rate: 65 (05/10 2257)  Labs: Recent Labs    07/16/2020 1749  HGB 11.2*  HCT 34.0*  PLT 339  CREATININE 1.62*    CrCl cannot be calculated (Unknown ideal weight.).   Medical History: Past Medical History:  Diagnosis Date  . Atrial fibrillation (HCC)     Medications:  Amiodarone  Eliquis  Lasix  Imdur  Xopenex  Lopressor   Protonix  KCl  Crestor Klonopin  Cardizem  Hydralazine Demadex  Norvasc    Assessment: 85 y.o. female admitted with CHF/AKI s/p fall, h/o Afib, to continue Eliquis  Plan:  Eliquis 2.5 mg BID  Caryl Pina 07/16/2020,11:58 PM

## 2020-07-13 NOTE — Telephone Encounter (Signed)
Okay.  Thanks.

## 2020-07-13 NOTE — Telephone Encounter (Signed)
Notified Ligi w/ MD response../lmb 

## 2020-07-13 NOTE — Telephone Encounter (Signed)
    [facility] calling to report patient had another fall today, complains of arm/hand pain. Declined to call EMS. Declined OV

## 2020-07-13 NOTE — ED Triage Notes (Signed)
Per EMS: Unwitnessed fall on Eloquis. Skin tear to right bicep, black and blue/painful right ring finger. PT states if she goes home shell be dead in 2 days. Unable to care for herself alone anymore.

## 2020-07-13 NOTE — ED Notes (Signed)
Patient reports that she does not feel safe going back home. Patient assured that she would be staying in the hospital overnight and possibly longer. Patient agreeable to plan.

## 2020-07-13 NOTE — H&P (Signed)
History and Physical  [name] [id number] DOB: [birth year] DOA: 07/22/2020  Referring physician: Dr. Sabra Heck, [place] PCP: Cassandria Anger, MD  Outpatient Specialists: Cardiology Patient coming from: Home  Chief Complaint: Fall x 2 in the last 2 weeks.   HPI: [name] is a 85 y.o. female with medical history significant for chronic A. fib on Eliquis, essential hypertension, likely others, but no records available at the time of this dictation, who presented to [facility] ED from home due to recurrent falls x2 in the last 2 weeks.  Associated with intermittent dizziness and worsening generalized weakness.  Her first fall was about 2 weeks ago where she hit her head while on Eliquis, was seen in the ED then sent home after intracranial hemorrhage was ruled out.  Since then she has become progressively weaker, with poor oral intake and not eating for the past 24 hours due to not being able to make it to the kitchen, difficulty ambulating with a walker.  She reports having another fall today while she was walking with her walker.  She felt dizzy, lost her balance, fell to the ground and landed on her right hand.  She denies hitting her head at this time.  EMS was activated.  She was brought to the ED for further evaluation.  Denies any chest pain.  Work-up in the ED revealed avulsion fracture of the dorsal base of the middle phalanx of the fourth digit seen on R hand x-ray.  Work-up also revealed pulmonary edema, cardiomegaly, and bilateral pleural effusions seen on chest x-ray.  TRH, hospitalist team, was asked to admit.   ED Course:  Afebrile.  BP 195/75, pulse 72, respiratory 18, O2 saturation 97% on 3 L.  Lab studies remarkable for hyponatremia, serum sodium 123, serum bicarb 31, BUN 21, creatinine 1.62, GFR 32, magnesium 2.1, AST 49.  Hemoglobin 11.2, WBC 10, platelet count 339.  Review of Systems: Review of systems as noted in the HPI. All other systems reviewed and are negative.   Past  Medical History:  Diagnosis Date  . Atrial fibrillation ([place])    History reviewed. No pertinent surgical history.  Social History:  reports that she has never smoked. She has never used smokeless tobacco. No history on file for alcohol use and drug use.   No Known Allergies  History reviewed. No pertinent family history.    Prior to Admission medications   Not on File    Physical Exam: BP (!) 183/71   Pulse 87   Temp 97.8 F (36.6 C) (Oral)   Resp (!) 26   SpO2 92%   . General: 85 y.o. year-old female frail-appearing in no acute distress.  Alert and oriented x3.  Very hard of hearing. . Cardiovascular: Irregular rate and rhythm with no rubs or gallops.  No thyromegaly or JVD noted.  No lower extremity edema. 2/4 pulses in all 4 extremities. Marland Kitchen Respiratory: Mild rales at bases.  No wheezing noted.  Poor inspiratory effort. . Abdomen: Soft nontender nondistended with normal bowel sounds x4 quadrants. . Muskuloskeletal: No cyanosis, clubbing or edema noted bilaterally . Neuro: CN II-XII intact, strength, sensation, reflexes . Skin: Bruising on right side of forehead.  Also bruising noted on fourth right finger. Marland Kitchen Psychiatry: Judgement and insight appear normal. Mood is appropriate for condition and setting          Labs on Admission:  Basic Metabolic Panel: Recent Labs  Lab 08/02/2020 1749  NA 123*  K 3.5  CL  79*  CO2 31  GLUCOSE 75  BUN 21  CREATININE 1.62*  CALCIUM 8.8*  MG 2.1   Liver Function Tests: Recent Labs  Lab 07/06/2020 1749  AST 49*  ALT 27  ALKPHOS 69  BILITOT 0.8  PROT 6.2*  ALBUMIN 2.8*   No results for input(s): LIPASE, AMYLASE in the last 168 hours. No results for input(s): AMMONIA in the last 168 hours. CBC: Recent Labs  Lab 07/15/2020 1749  WBC 10.0  NEUTROABS 7.8*  HGB 11.2*  HCT 34.0*  MCV 83.3  PLT 339   Cardiac Enzymes: No results for input(s): CKTOTAL, CKMB, CKMBINDEX, TROPONINI in the last 168 hours.  BNP (last 3  results) No results for input(s): BNP in the last 8760 hours.  ProBNP (last 3 results) No results for input(s): PROBNP in the last 8760 hours.  CBG: No results for input(s): GLUCAP in the last 168 hours.  Radiological Exams on Admission: DG Chest 2 View  Result Date: 07/06/2020 CLINICAL DATA:  Unwitnessed fall. EXAM: CHEST - 2 VIEW COMPARISON:  06/26/2020 FINDINGS: The cardio pericardial silhouette is enlarged. Diffuse interstitial opacity is associated with bibasilar collapse/consolidation and small bilateral pleural effusions. The visualized bony structures of the thorax show no acute abnormality. Telemetry leads overlie the chest. IMPRESSION: Findings suggest edema with bibasilar collapse/consolidation and small bilateral pleural effusions. Electronically Signed   By: Misty Stanley M.D.   On: 07/27/2020 20:37   CT Head Wo Contrast  Result Date: 07/06/2020 CLINICAL DATA:  Recent fall with headaches, initial encounter EXAM: CT HEAD WITHOUT CONTRAST TECHNIQUE: Contiguous axial images were obtained from the base of the skull through the vertex without intravenous contrast. COMPARISON:  06/26/2020 FINDINGS: Brain: No evidence of acute infarction, hemorrhage, hydrocephalus, extra-axial collection or mass lesion/mass effect. Vascular: No hyperdense vessel or unexpected calcification. Skull: Normal. Negative for fracture or focal lesion. Sinuses/Orbits: No acute finding. Other: Right frontal scalp hematoma is noted improved when compared with the prior exam. IMPRESSION: Improved right frontal scalp hematoma. No acute intracranial abnormality noted. Electronically Signed   By: Inez Catalina M.D.   On: 07/30/2020 18:41   DG Hand Complete Right  Result Date: 07/21/2020 CLINICAL DATA:  85 year old female with trauma. EXAM: RIGHT HAND - COMPLETE 3+ VIEW COMPARISON:  None. FINDINGS: There is avulsion fracture of the dorsal base of the middle phalanx of the fourth digit. No other acute fracture. There is  advanced osteopenia. Degenerative changes of the interphalangeal joints with distal interphalangeal fusion of the fourth and fifth digits. There is mild soft tissue swelling of the fourth digit. No radiopaque foreign object or soft tissue gas. IMPRESSION: Avulsion fracture of the dorsal base of the middle phalanx of the fourth digit. Electronically Signed   By: Anner Crete M.D.   On: 08/02/2020 20:42    EKG: I independently viewed the EKG done and my findings are as followed: A. fib.  Rate of 77.  Nonspecific ST-T changes.  QTc 537.  Assessment/Plan Present on Admission: . Fall  Active Problems:   Fall  Fall, reported intermittent dizziness States she felt dizzy then loss her balance while using her walker and landed on her right hand. X-ray of right hand revealed avulsion fracture of the dorsal base of the middle phalanx of the fourth digit seen on x-ray. Also has worsening generalized weakness and poor oral intake Presented with hyponatremia with serum sodium of 123. Treat underlying conditions Obtain orthostatic vital signs PT OT to assess Fall precautions  Avulsion fracture of the  dorsal base of the middle phalanx of the fourth digit seen on x-ray. Pain management Please consult orthopedic surgery in the morning.  Hypovolemic hyponatremia Presented with serum sodium 123 likely secondary to poor oral intake Obtain TSH, serum osmolality, urine sodium, urine osmolality Repeat serum sodium every 4 hours, avoid rapid correction of hyponatremia, no more than 8 mEq in 24 hours.  Bilateral pleural effusions/pulmonary edema/cardiomegaly suspect cardiogenic Obtain 2D echo Closely monitor volume status while on IV fluid Start strict I's and O's and daily weight At the time of this dictation patient's home medications were not reconciled. Incentive spirometer Mobilize as tolerated.  Acute CHF, unspecified Obtain BNP Obtain 2D echo Strict I's and O's and daily weight  Moderate  protein calorie malnutrition Moderate muscle mass loss Hypoalbuminemia 2.8 Dietary consult  AKI versus CKD 3B likely prerenal in the setting of dehydration from poor oral intake. No prior records to compare Presented with creatinine of 1.6 with GFR 32. Continue judicious IV fluid hydration. Closely monitor urine output Avoid nephrotoxic agents  Prolonged QTC Admission twelve-lead EKG showing QTC of 537 Optimize magnesium and potassium levels Avoid QTC prolonging agents Repeat twelve-lead EKG in the morning.  Essential hypertension BP is currently not at goal of 130/80 Home medications have not yet reconciled Start p.o. hydralazine while waiting for home meds to be reconciled. IV Lopressor as needed with parameters.  Chronic A. fib on Eliquis Home meds not yet reconciled. Resume Eliquis IV Lopressor as needed with parameters.  Isolated elevated AST Alkaline phosphatase, ALT and T bilirubin normal. Monitor and repeat CMP in the morning  Normocytic anemia Unknown chronicity, no prior records to compare Presented with hemoglobin of 11.2, MCV 83. Monitor    DVT prophylaxis: Eliquis.  Code Status: Full code.  Family Communication: None at bedside.  Disposition Plan: Admit to telemetry medical.  Consults called: None.  Admission status: Inpatient status.  Patient will require at least 2 midnights for further evaluation and treatment of present condition.   Status is: Inpatient    Dispo:  Patient From: Home  Planned Disposition: Home with home health services versus SNF possibly on 07/15/2020 once symptomatology has improved.  Medically stable for discharge: No, ongoing management of hypovolemic hyponatremia, acute CHF, uinspecified.         Kayleen Memos MD [facility] Pager [phone]  If 7PM-7AM, please contact night-coverage [email or web address] Password [email or web address]  07/18/2020, 9:02 PM

## 2020-07-13 NOTE — ED Notes (Signed)
Patient transported to CT 

## 2020-07-13 NOTE — ED Provider Notes (Signed)
[place] EMERGENCY DEPARTMENT Provider Note   CSN: [id number] Arrival date & time: 07/11/2020  1743     History Chief Complaint  Patient presents with  . Fall    [name] is a 85 y.o. female.  HPI   This patient is a pleasant 85 year old female, reportedly the patient is on Eliquis presumably for atrial fibrillation.  She presents to the hospital after having another fall.  According to the patient and the paramedics who are the primary historians the patient had a fall approximately 2 weeks ago during which time she struck her head, she states that she was evaluated and released and has done well with regards to the head injury however she has been progressively more weak and has difficulty ambulating, she has not been able to eat in over 24 hours because she cannot get to the kitchen to make food.  She has been eating less and less because of this weakness and inability to care for herself.  She reportedly had another fall today when she was walking with her walker, she lost her balance falling to the ground and landed on her right hand causing an injury to the right fourth finger.  She had a skin tear to the right upper arm, she denies hitting her head.  She does have some pain when she takes of breath after the fall.  She denies any pain in her legs, states it took her quite some time to be able to get up.  The paramedics reported that they activated as a level 2 trauma because she had another fall on blood thinners however the patient is adamant that she did not hit her head and there was no evidence of a new head injury just the pre-existing old hematoma to the right frontal forehead.  The patient denies any predisposing chest pain or shortness of breath prior to the fall.  No medications were given prehospital  Past Medical History:  Diagnosis Date  . Atrial fibrillation [facility])     Patient Active Problem List   Diagnosis Date Noted  . Fall 07/18/2020     History reviewed. No pertinent surgical history.   OB History   No obstetric history on file.     History reviewed. No pertinent family history.  Social History   Tobacco Use  . Smoking status: Never Smoker  . Smokeless tobacco: Never Used    Home Medications Prior to Admission medications   Not on File    Allergies    Patient has no known allergies.  Review of Systems   Review of Systems  All other systems reviewed and are negative.   Physical Exam Updated Vital Signs BP (!) 170/61   Pulse 79   Temp 97.8 F (36.6 C) (Oral)   Resp (!) 24   SpO2 93%   Physical Exam Vitals and nursing note reviewed.  Constitutional:      General: She is not in acute distress.    Appearance: She is well-developed.  HENT:     Head: Normocephalic.     Comments: Fading hematoma and swelling to the right forehead, also present in the right periorbital area, no new ecchymosis, no tenderness over the orbital rim or the nasal bridge, no malocclusion, no hemotympanum    Nose:     Comments: No tenderness over the nasal bridge, no septal hematoma    Mouth/Throat:     Pharynx: No oropharyngeal exudate.  Eyes:     General: No scleral  icterus.       Right eye: No discharge.        Left eye: No discharge.     Conjunctiva/sclera: Conjunctivae normal.     Pupils: Pupils are equal, round, and reactive to light.  Neck:     Thyroid: No thyromegaly.     Vascular: No JVD.  Cardiovascular:     Rate and Rhythm: Normal rate. Rhythm irregular.     Heart sounds: Normal heart sounds. No murmur heard. No friction rub. No gallop.   Pulmonary:     Effort: Pulmonary effort is normal. No respiratory distress.     Breath sounds: Normal breath sounds. No wheezing or rales.  Abdominal:     General: Bowel sounds are normal. There is no distension.     Palpations: Abdomen is soft. There is no mass.     Tenderness: There is no abdominal tenderness.  Musculoskeletal:        General: Tenderness  present. Normal range of motion.     Cervical back: Normal range of motion and neck supple.     Right lower leg: No edema.     Left lower leg: No edema.     Comments: There does appear to be slight deformity to the right fourth finger with diffuse ecchymotic bruising of the finger and spreading onto the base of the hand.  Normal range of motion of the bilateral upper extremities otherwise including the hands wrists elbows shoulders as well as the lower extremities at the hips knees and ankles.  She is able to straight leg raise bilaterally with normal strength.  She has no tenderness over the compartments of her arms or legs, no tenderness over the spinal processes  Lymphadenopathy:     Cervical: No cervical adenopathy.  Skin:    General: Skin is warm and dry.     Findings: No erythema or rash.     Comments: Small skin tear to the right upper extremity just proximal to the elbow  Neurological:     Mental Status: She is alert.     Coordination: Coordination normal.     Comments: The patient is awake alert and able to follow my commands and answer my questions.  She does appear generally weak, she is able to assist with sitting up in the bed but has somewhat of a hard time doing this by herself.  Her mental status is clear and she answers my questions appropriately  Psychiatric:        Behavior: Behavior normal.     ED Results / Procedures / Treatments   Labs (all labs ordered are listed, but only abnormal results are displayed) Labs Reviewed  COMPREHENSIVE METABOLIC PANEL - Abnormal; Notable for the following components:      Result Value   Sodium 123 (*)    Chloride 79 (*)    Creatinine, Ser 1.62 (*)    Calcium 8.8 (*)    Total Protein 6.2 (*)    Albumin 2.8 (*)    AST 49 (*)    GFR, Estimated 32 (*)    All other components within normal limits  CBC WITH DIFFERENTIAL/PLATELET - Abnormal; Notable for the following components:   Hemoglobin 11.2 (*)    HCT 34.0 (*)    RDW 21.7 (*)     Neutro Abs 7.8 (*)    Monocytes Absolute 1.3 (*)    All other components within normal limits  URINALYSIS, ROUTINE W REFLEX MICROSCOPIC - Abnormal; Notable for the following components:  Color, Urine STRAW (*)    Specific Gravity, Urine 1.003 (*)    Protein, ur 100 (*)    All other components within normal limits  RESP PANEL BY RT-PCR (FLU A&B, COVID) ARPGX2  MAGNESIUM  SODIUM, URINE, RANDOM  OSMOLALITY, URINE  TSH  OSMOLALITY  SODIUM  SODIUM  SODIUM  CBC  COMPREHENSIVE METABOLIC PANEL  MAGNESIUM  PHOSPHORUS  BRAIN NATRIURETIC PEPTIDE    EKG EKG Interpretation  Date/Time:  Tuesday Jul 13 2020 17:55:05 EDT Ventricular Rate:  77 PR Interval:    QRS Duration: 102 QT Interval:  474 QTC Calculation: 537 R Axis:   115 Text Interpretation: nsr with some arrhythmia vs afib - rate controlled Ventricular premature complex Right axis deviation Low voltage, extremity leads Consider left ventricular hypertrophy Nonspecific T abnormalities, lateral leads Prolonged QT interval no old EKG to compare Confirmed by Noemi Chapel [phone]) on 07/18/2020 5:58:33 PM   Radiology DG Chest 2 View  Result Date: 07/29/2020 CLINICAL DATA:  Unwitnessed fall. EXAM: CHEST - 2 VIEW COMPARISON:  06/26/2020 FINDINGS: The cardio pericardial silhouette is enlarged. Diffuse interstitial opacity is associated with bibasilar collapse/consolidation and small bilateral pleural effusions. The visualized bony structures of the thorax show no acute abnormality. Telemetry leads overlie the chest. IMPRESSION: Findings suggest edema with bibasilar collapse/consolidation and small bilateral pleural effusions. Electronically Signed   By: Misty Stanley M.D.   On: 07/16/2020 20:37   CT Head Wo Contrast  Result Date: 07/11/2020 CLINICAL DATA:  Recent fall with headaches, initial encounter EXAM: CT HEAD WITHOUT CONTRAST TECHNIQUE: Contiguous axial images were obtained from the base of the skull through the vertex without  intravenous contrast. COMPARISON:  06/26/2020 FINDINGS: Brain: No evidence of acute infarction, hemorrhage, hydrocephalus, extra-axial collection or mass lesion/mass effect. Vascular: No hyperdense vessel or unexpected calcification. Skull: Normal. Negative for fracture or focal lesion. Sinuses/Orbits: No acute finding. Other: Right frontal scalp hematoma is noted improved when compared with the prior exam. IMPRESSION: Improved right frontal scalp hematoma. No acute intracranial abnormality noted. Electronically Signed   By: Inez Catalina M.D.   On: 07/09/2020 18:41   DG Hand Complete Right  Result Date: 07/11/2020 CLINICAL DATA:  85 year old female with trauma. EXAM: RIGHT HAND - COMPLETE 3+ VIEW COMPARISON:  None. FINDINGS: There is avulsion fracture of the dorsal base of the middle phalanx of the fourth digit. No other acute fracture. There is advanced osteopenia. Degenerative changes of the interphalangeal joints with distal interphalangeal fusion of the fourth and fifth digits. There is mild soft tissue swelling of the fourth digit. No radiopaque foreign object or soft tissue gas. IMPRESSION: Avulsion fracture of the dorsal base of the middle phalanx of the fourth digit. Electronically Signed   By: Anner Crete M.D.   On: 08/02/2020 20:42    Procedures Procedures   Medications Ordered in ED Medications  0.9 %  sodium chloride infusion (has no administration in time range)  metoprolol tartrate (LOPRESSOR) injection 5 mg (5 mg Intravenous Given 07/09/2020 2143)  acetaminophen (TYLENOL) tablet 650 mg (has no administration in time range)  prochlorperazine (COMPAZINE) injection 10 mg (has no administration in time range)  melatonin tablet 3 mg (has no administration in time range)  0.9 %  sodium chloride infusion ( Intravenous Rate/Dose Change 07/11/2020 2120)    ED Course  I have reviewed the triage vital signs and the nursing notes.  Pertinent labs & imaging results that were available during  my care of the patient were reviewed by me and  considered in my medical decision making (see chart for details).    MDM Rules/Calculators/A&P                          The patient's level of trauma was downgraded to a nontrauma as she did not have recurrent head injury, that being said since she had a fall 2 weeks ago she has had increasing weakness and although this is a chronic finding I think it is important to rule out subdural hematoma so CT will be ordered.  Additionally the progressive weakness may be as a result of malnutrition renal failure dehydration electrolyte abnormalities or infection thus we will obtain a urinalysis lab work and a chest x-ray not only to document the lack of pneumonia but also to document the lack of rib fractures or pneumothorax.  A COVID test will be ordered and social work will be consulted as she may need placement in a higher level of care.  Pt has lower sodium, likely what is causing her generalized weakness.  Thankfully her x-rays show no obvious fractures of the hand or injury to the brain.  Urinalysis reveals that she does not appear particularly dehydrated but does have proteinuria.  I discussed her care with the hospitalist who will admit  Final Clinical Impression(s) / ED Diagnoses Final diagnoses:  Hyponatremia  Contusion of right hand, initial encounter  Generalized weakness    Rx / DC Orders ED Discharge Orders    None       Noemi Chapel, MD 07/22/2020 2158

## 2020-07-14 ENCOUNTER — Telehealth: Payer: Self-pay | Admitting: Student

## 2020-07-14 ENCOUNTER — Other Ambulatory Visit: Payer: Self-pay

## 2020-07-14 ENCOUNTER — Inpatient Hospital Stay (HOSPITAL_COMMUNITY): Payer: Medicare Other

## 2020-07-14 ENCOUNTER — Other Ambulatory Visit (HOSPITAL_COMMUNITY): Payer: Medicare Other

## 2020-07-14 ENCOUNTER — Other Ambulatory Visit: Payer: Medicare Other | Admitting: Student

## 2020-07-14 DIAGNOSIS — E785 Hyperlipidemia, unspecified: Secondary | ICD-10-CM

## 2020-07-14 DIAGNOSIS — I480 Paroxysmal atrial fibrillation: Secondary | ICD-10-CM

## 2020-07-14 DIAGNOSIS — E039 Hypothyroidism, unspecified: Secondary | ICD-10-CM

## 2020-07-14 DIAGNOSIS — F419 Anxiety disorder, unspecified: Secondary | ICD-10-CM

## 2020-07-14 DIAGNOSIS — I5031 Acute diastolic (congestive) heart failure: Secondary | ICD-10-CM

## 2020-07-14 DIAGNOSIS — J449 Chronic obstructive pulmonary disease, unspecified: Secondary | ICD-10-CM

## 2020-07-14 DIAGNOSIS — I5022 Chronic systolic (congestive) heart failure: Secondary | ICD-10-CM

## 2020-07-14 DIAGNOSIS — E871 Hypo-osmolality and hyponatremia: Principal | ICD-10-CM | POA: Insufficient documentation

## 2020-07-14 LAB — COMPREHENSIVE METABOLIC PANEL
ALT: 27 U/L (ref 0–44)
AST: 39 U/L (ref 15–41)
Albumin: 2.4 g/dL — ABNORMAL LOW (ref 3.5–5.0)
Alkaline Phosphatase: 59 U/L (ref 38–126)
Anion gap: 11 (ref 5–15)
BUN: 22 mg/dL (ref 8–23)
CO2: 33 mmol/L — ABNORMAL HIGH (ref 22–32)
Calcium: 8.5 mg/dL — ABNORMAL LOW (ref 8.9–10.3)
Chloride: 84 mmol/L — ABNORMAL LOW (ref 98–111)
Creatinine, Ser: 1.65 mg/dL — ABNORMAL HIGH (ref 0.44–1.00)
GFR, Estimated: 31 mL/min — ABNORMAL LOW (ref >60)
Glucose, Bld: 78 mg/dL (ref 70–99)
Potassium: 2.9 mmol/L — ABNORMAL LOW (ref 3.5–5.1)
Sodium: 128 mmol/L — ABNORMAL LOW (ref 135–145)
Total Bilirubin: 0.6 mg/dL (ref 0.3–1.2)
Total Protein: 5.5 g/dL — ABNORMAL LOW (ref 6.5–8.1)

## 2020-07-14 LAB — CBC
HCT: 30.2 % — ABNORMAL LOW (ref 36.0–46.0)
Hemoglobin: 10 g/dL — ABNORMAL LOW (ref 12.0–15.0)
MCH: 27.6 pg (ref 26.0–34.0)
MCHC: 33.1 g/dL (ref 30.0–36.0)
MCV: 83.4 fL (ref 80.0–100.0)
Platelets: 303 10*3/uL (ref 150–400)
RBC: 3.62 MIL/uL — ABNORMAL LOW (ref 3.87–5.11)
RDW: 21.4 % — ABNORMAL HIGH (ref 11.5–15.5)
WBC: 9.1 10*3/uL (ref 4.0–10.5)
nRBC: 0 % (ref 0.0–0.2)

## 2020-07-14 LAB — OSMOLALITY: Osmolality: 272 mOsm/kg — ABNORMAL LOW (ref 275–295)

## 2020-07-14 LAB — TSH: TSH: 5.552 u[IU]/mL — ABNORMAL HIGH (ref 0.350–4.500)

## 2020-07-14 LAB — SODIUM
Sodium: 128 mmol/L — ABNORMAL LOW (ref 135–145)
Sodium: 126 mmol/L — ABNORMAL LOW (ref 135–145)

## 2020-07-14 LAB — ECHOCARDIOGRAM COMPLETE
Area-P 1/2: 4.86 cm2
Calc EF: 52.9 %
P 1/2 time: 488 msec
S' Lateral: 2.8 cm
Single Plane A2C EF: 58.2 %
Single Plane A4C EF: 41.7 %

## 2020-07-14 LAB — PHOSPHORUS: Phosphorus: 3.7 mg/dL (ref 2.5–4.6)

## 2020-07-14 LAB — BRAIN NATRIURETIC PEPTIDE: B Natriuretic Peptide: 2033.7 pg/mL — ABNORMAL HIGH (ref 0.0–100.0)

## 2020-07-14 LAB — MAGNESIUM: Magnesium: 2 mg/dL (ref 1.7–2.4)

## 2020-07-14 MED ORDER — ROSUVASTATIN CALCIUM 5 MG PO TABS
10.0000 mg | ORAL_TABLET | Freq: Every day | ORAL | Status: DC
  Administered 2020-07-14 – 2020-07-17 (×3): 10 mg via ORAL
  Filled 2020-07-14 (×4): qty 2

## 2020-07-14 MED ORDER — APIXABAN 2.5 MG PO TABS: 2.5000 mg | ORAL_TABLET | Freq: Two times a day (BID) | ORAL | Status: DC

## 2020-07-14 MED ORDER — ALBUTEROL SULFATE (2.5 MG/3ML) 0.083% IN NEBU: 3.0000 mL | INHALATION_SOLUTION | Freq: Four times a day (QID) | RESPIRATORY_TRACT | Status: DC | PRN

## 2020-07-14 MED ORDER — ADULT MULTIVITAMIN W/MINERALS CH
1.0000 | ORAL_TABLET | Freq: Every day | ORAL | Status: DC
  Administered 2020-07-14 – 2020-07-18 (×5): 1 via ORAL
  Filled 2020-07-14 (×5): qty 1

## 2020-07-14 MED ORDER — LEVOTHYROXINE SODIUM 25 MCG PO TABS
25.0000 ug | ORAL_TABLET | Freq: Every day | ORAL | Status: DC
  Administered 2020-07-15 – 2020-07-18 (×4): 25 ug via ORAL
  Filled 2020-07-14 (×4): qty 1

## 2020-07-14 MED ORDER — POTASSIUM CHLORIDE 10 MEQ/100ML IV SOLN
10.0000 meq | INTRAVENOUS | Status: AC
  Administered 2020-07-14 (×3): 10 meq via INTRAVENOUS
  Filled 2020-07-14 (×3): qty 100

## 2020-07-14 MED ORDER — UMECLIDINIUM BROMIDE 62.5 MCG/INH IN AEPB
1.0000 | INHALATION_SPRAY | Freq: Every day | RESPIRATORY_TRACT | Status: DC
  Administered 2020-07-16 – 2020-07-17 (×2): 1 via RESPIRATORY_TRACT
  Filled 2020-07-14 (×2): qty 7

## 2020-07-14 MED ORDER — CLONAZEPAM 0.5 MG PO TABS
0.5000 mg | ORAL_TABLET | Freq: Every evening | ORAL | Status: DC | PRN
  Administered 2020-07-15 – 2020-07-16 (×2): 0.5 mg via ORAL
  Filled 2020-07-14 (×2): qty 1

## 2020-07-14 MED ORDER — ISOSORBIDE MONONITRATE ER 30 MG PO TB24
30.0000 mg | ORAL_TABLET | Freq: Every day | ORAL | Status: DC
  Administered 2020-07-14 – 2020-07-18 (×5): 30 mg via ORAL
  Filled 2020-07-14 (×6): qty 1

## 2020-07-14 MED ORDER — APIXABAN 2.5 MG PO TABS
2.5000 mg | ORAL_TABLET | Freq: Two times a day (BID) | ORAL | Status: DC
  Administered 2020-07-14 – 2020-07-16 (×6): 2.5 mg via ORAL
  Filled 2020-07-14 (×6): qty 1

## 2020-07-14 MED ORDER — ENSURE ENLIVE PO LIQD
237.0000 mL | Freq: Three times a day (TID) | ORAL | Status: DC
  Administered 2020-07-15 – 2020-07-17 (×3): 237 mL via ORAL

## 2020-07-14 MED ORDER — PHENOL 1.4 % MT LIQD
1.0000 | OROMUCOSAL | Status: DC | PRN
  Filled 2020-07-14: qty 177

## 2020-07-14 MED ORDER — POTASSIUM CHLORIDE 20 MEQ PO PACK
40.0000 meq | PACK | Freq: Two times a day (BID) | ORAL | Status: DC
  Administered 2020-07-15 – 2020-07-18 (×6): 40 meq via ORAL
  Filled 2020-07-14 (×6): qty 2

## 2020-07-14 MED ORDER — AMIODARONE HCL 200 MG PO TABS
200.0000 mg | ORAL_TABLET | Freq: Every day | ORAL | Status: DC
  Administered 2020-07-14 – 2020-07-16 (×3): 200 mg via ORAL
  Filled 2020-07-14 (×3): qty 1

## 2020-07-14 MED ORDER — TORSEMIDE 20 MG PO TABS
40.0000 mg | ORAL_TABLET | Freq: Every day | ORAL | Status: DC
  Administered 2020-07-15 – 2020-07-16 (×2): 40 mg via ORAL
  Filled 2020-07-14 (×2): qty 2

## 2020-07-14 MED ORDER — DILTIAZEM HCL ER COATED BEADS 120 MG PO CP24
120.0000 mg | ORAL_CAPSULE | Freq: Every day | ORAL | Status: DC
  Administered 2020-07-14 – 2020-07-16 (×3): 120 mg via ORAL
  Filled 2020-07-14 (×3): qty 1

## 2020-07-14 MED ORDER — TORSEMIDE 20 MG PO TABS
20.0000 mg | ORAL_TABLET | Freq: Every day | ORAL | Status: DC
  Administered 2020-07-14 – 2020-07-16 (×3): 20 mg via ORAL
  Filled 2020-07-14 (×4): qty 1

## 2020-07-14 MED ORDER — PANTOPRAZOLE SODIUM 40 MG PO TBEC
40.0000 mg | DELAYED_RELEASE_TABLET | Freq: Every day | ORAL | Status: DC
  Administered 2020-07-14 – 2020-07-18 (×5): 40 mg via ORAL
  Filled 2020-07-14 (×5): qty 1

## 2020-07-14 MED ORDER — FLUTICASONE FUROATE-VILANTEROL 100-25 MCG/INH IN AEPB
1.0000 | INHALATION_SPRAY | Freq: Every day | RESPIRATORY_TRACT | Status: DC
  Administered 2020-07-16 – 2020-07-18 (×3): 1 via RESPIRATORY_TRACT
  Filled 2020-07-14: qty 28

## 2020-07-14 MED ORDER — FLUTICASONE-UMECLIDIN-VILANT 100-62.5-25 MCG/INH IN AEPB: 1.0000 | INHALATION_SPRAY | Freq: Every day | RESPIRATORY_TRACT | Status: DC

## 2020-07-14 MED ORDER — POTASSIUM CHLORIDE CRYS ER 20 MEQ PO TBCR
40.0000 meq | EXTENDED_RELEASE_TABLET | ORAL | Status: DC
  Filled 2020-07-14: qty 2

## 2020-07-14 NOTE — Progress Notes (Signed)
  Echocardiogram 2D Echocardiogram has been performed.  Darlina Sicilian M 07/14/2020, 3:29 PM

## 2020-07-14 NOTE — Social Work (Addendum)
Outpatient LCSW sent message to inpatient [facility]. Let her know that pt sees our [facility] team. Pt lives alone, has a sister who checks in on her but cannot provide any care- she has no additional family members involved. Pt was scheduled to have palliative care and [place] starting this week w/ [facility] and [facility]. Pt may benefit from SNF stay however was avoidant to that recommendation during previous hospital stay and when this writer spoke with her from our clinic. Available as needed to assist with outpatient needs.   Westley Hummer, MSW, [place]  [phone]

## 2020-07-14 NOTE — Plan of Care (Signed)
  Problem: Education: Goal: Knowledge of General Education information will improve Description: Including pain rating scale, medication(s)/side effects and non-pharmacologic comfort measures Outcome: Progressing   Problem: Clinical Measurements: Goal: Ability to maintain clinical measurements within normal limits will improve Outcome: Progressing Goal: Will remain free from infection Outcome: Progressing Goal: Diagnostic test results will improve Outcome: Progressing Goal: Respiratory complications will improve Outcome: Progressing Goal: Cardiovascular complication will be avoided Outcome: Progressing   Problem: Safety: Goal: Ability to remain free from injury will improve Outcome: Progressing   Problem: Pain Managment: Goal: General experience of comfort will improve Outcome: Progressing   Problem: Skin Integrity: Goal: Risk for impaired skin integrity will decrease Outcome: Progressing

## 2020-07-14 NOTE — Progress Notes (Addendum)
PROGRESS NOTE    [name]  [id number] DOB: 28-Mar-1935 DOA: 07/08/2020 PCP: Cassandria Anger, MD   Brief Narrative: [name] is a 85 y.o. female with a history of paroxysmal atrial fibrillation, more thrombus on Eliquis, pulm hypertension, hypothyroidism, hyperlipidemia, anxiety, significant COPD, chronic systolic heart failure.  Patient presents secondary to dizziness which is a recurrent issue.  Complicated by Eliquis use.  Patient was recently in the hospital for similar presentation.  On admission, patient found to have an avulsion fracture of right third digit in addition to hyponatremia.   Assessment & Plan:   Active Problems:   Fall   Hypernatremia   AF (paroxysmal atrial fibrillation) (HCC)   Chronic systolic CHF (congestive heart failure) (HCC)   Hypothyroidism   COPD (chronic obstructive pulmonary disease) (HCC)   Anxiety   Hyperlipidemia  Fall Non-syncopal although patient has had lightheadedness prior to falling. She has fallen multiple times in as many weeks. Patient is on multiple antihypertensives which are possibly contributing. -PT/OT recommendations  Avulsion fracture of right fourth digit In setting of fall. Patient with some pain and swelling. Discussed with orthopedic surgery who recommended splint in extension and outpatient follow-up with Dr. Caralyn Guile in 2 weeks. Use as tolerated. -Splint  Hyponatremia Unsure of etiology. Patient with some evidence of hypervolemia but she has had some improvement with IV fluids. Albumin also low. -Hold IV fluids -Serial sodium/BMP  Left second toe swelling Concern for fracture. -x-ray left left toe  History of mural thrombus Paroxysmal atrial fibrillation Patient is currently on Eliquis 2.5 mg BID as an outpatient which was recommended by Cardiology in 06/2020. It appears metoprolol was discontinued on last admission discharge. -Continue Eliquis 2.5 mg BID -Resume home amiodarone and diltiazem -Hold  Metoprolol  Primary hypertension Patient is on amlodipine, diltiazem, hydralazine, Imdur and metoprolol as an outpatient. On chart review, metoprolol appears to have been recommended for discontinuation. Possibly complicating falls -Resume home diltiazem as mentioned above -Resume home Imdur -Hold amlodipine, hyralazine and metoprolol  Chronic systolic heart failure Last EF of 55-60% which was improved from previous EF of 35-40%. Patient is on torsemide, potassium as an outpatient. -Resume home torsemide and potassium  Right forehead hematoma Improved from one month prior but likely slow to improve secondary to Eliquis use.  Hypothyroidism TSH of 5.552 on admission. Patient is on synthroid. Although she has hyponatremia, unsure this is the cause. With history of atrial fibrillation, and the fact that TSH is down from peak of around 7.7 in January, will opt to continue current dosing. -Continue Synthroid 25 mcg daily  Anxiety -Continue klonopin qhs prn  Hyperlipidemia -Continue Crestor  COPD -Continue Trelegy ellipta and albuterol prn  GERD -Continue Protonix   DVT prophylaxis: Eliquis Code Status:   Code Status: Full Code Family Communication: None at bedside Disposition Plan: Discharge home vs SNF pending PT recommendations, x-ray   Consultants:   Orthopedic surgery  Procedures:   None  Antimicrobials:  None    Subjective: No significant issues overnight. Some finger pain intermittently  Objective: Vitals:   07/14/20 0427 07/14/20 0503 07/14/20 0638 07/14/20 0700  BP:  (!) 183/67 (!) 167/62 (!) 190/71  Pulse: 79 81  63  Resp: '20 19  19  '$ Temp: 98.4 F (36.9 C) 98.3 F (36.8 C)  98.6 F (37 C)  TempSrc: Oral Oral  Oral  SpO2: 92% 93% 92% 91%    Intake/Output Summary (Last 24 hours) at 07/14/2020 1325 Last data filed at 07/14/2020  [id number] Gross per 24 hour  Intake 650.25 ml  Output --  Net 650.25 ml   There were no vitals filed for this  visit.  Examination:  General exam: Appears calm and comfortable HEENT: Right forehead hematoma that is tender, non-erythematous Respiratory system: Clear to auscultation. Respiratory effort normal. Cardiovascular system: S1 & S2 heard, RRR. Gastrointestinal system: Abdomen is nondistended, soft and nontender. No organomegaly or masses felt. Normal bowel sounds heard. Central nervous system: Alert and oriented. No focal neurological deficits. Musculoskeletal: BLE edema. No calf tenderness Skin: No cyanosis. No rashes Psychiatry: Judgement and insight appear normal. Mood & affect appropriate.     Data Reviewed: I have personally reviewed following labs and imaging studies  CBC Lab Results  Component Value Date   WBC 9.1 07/14/2020   RBC 3.62 (L) 07/14/2020   HGB 10.0 (L) 07/14/2020   HCT 30.2 (L) 07/14/2020   MCV 83.4 07/14/2020   MCH 27.6 07/14/2020   PLT 303 07/14/2020   MCHC 33.1 07/14/2020   RDW 21.4 (H) 07/14/2020   LYMPHSABS 0.7 07/06/2020   MONOABS 1.3 (H) 07/15/2020   EOSABS 0.0 07/25/2020   BASOSABS 0.0 [id number]     Last metabolic panel Lab Results  Component Value Date   NA 128 (L) 07/14/2020   K 2.9 (L) 07/14/2020   CL 84 (L) 07/14/2020   CO2 33 (H) 07/14/2020   BUN 22 07/14/2020   CREATININE 1.65 (H) 07/14/2020   GLUCOSE 78 07/14/2020   GFRNONAA 31 (L) 07/14/2020   CALCIUM 8.5 (L) 07/14/2020   PHOS 3.7 07/14/2020   PROT 5.5 (L) 07/14/2020   ALBUMIN 2.4 (L) 07/14/2020   BILITOT 0.6 07/14/2020   ALKPHOS 59 07/14/2020   AST 39 07/14/2020   ALT 27 07/14/2020   ANIONGAP 11 07/14/2020    CBG (last 3)  No results for input(s): GLUCAP in the last 72 hours.   GFR: CrCl cannot be calculated (Unknown ideal weight.).  Coagulation Profile: No results for input(s): INR, PROTIME in the last 168 hours.  Recent Results (from the past 240 hour(s))  Resp Panel by RT-PCR (Flu A&B, Covid) Nasopharyngeal Swab     Status: None   Collection Time: 07/11/2020   5:51 PM   Specimen: Nasopharyngeal Swab; Nasopharyngeal(NP) swabs in vial transport medium  Result Value Ref Range Status   SARS Coronavirus 2 by RT PCR NEGATIVE NEGATIVE Final    Comment: (NOTE) SARS-CoV-2 target nucleic acids are NOT DETECTED.  The SARS-CoV-2 RNA is generally detectable in upper respiratory specimens during the acute phase of infection. The lowest concentration of SARS-CoV-2 viral copies this assay can detect is 138 copies/mL. A negative result does not preclude SARS-Cov-2 infection and should not be used as the sole basis for treatment or other patient management decisions. A negative result may occur with  improper specimen collection/handling, submission of specimen other than nasopharyngeal swab, presence of viral mutation(s) within the areas targeted by this assay, and inadequate number of viral copies(<138 copies/mL). A negative result must be combined with clinical observations, patient history, and epidemiological information. The expected result is Negative.  Fact Sheet for Patients:  EntrepreneurPulse.com.au  Fact Sheet for Healthcare Providers:  IncredibleEmployment.be  This test is no t yet approved or cleared by the [place] FDA and  has been authorized for detection and/or diagnosis of SARS-CoV-2 by FDA under an Emergency Use Authorization (EUA). This EUA will remain  in effect (meaning this test can be used) for the duration of the COVID-19 declaration under  Section 564(b)(1) of the Act, 21 U.S.C.section 360bbb-3(b)(1), unless the authorization is terminated  or revoked sooner.       Influenza A by PCR NEGATIVE NEGATIVE Final   Influenza B by PCR NEGATIVE NEGATIVE Final    Comment: (NOTE) The Xpert Xpress SARS-CoV-2/FLU/RSV plus assay is intended as an aid in the diagnosis of influenza from Nasopharyngeal swab specimens and should not be used as a sole basis for treatment. Nasal washings and aspirates  are unacceptable for Xpert Xpress SARS-CoV-2/FLU/RSV testing.  Fact Sheet for Patients: EntrepreneurPulse.com.au  Fact Sheet for Healthcare Providers: IncredibleEmployment.be  This test is not yet approved or cleared by the [place] FDA and has been authorized for detection and/or diagnosis of SARS-CoV-2 by FDA under an Emergency Use Authorization (EUA). This EUA will remain in effect (meaning this test can be used) for the duration of the COVID-19 declaration under Section 564(b)(1) of the Act, 21 U.S.C. section 360bbb-3(b)(1), unless the authorization is terminated or revoked.  Performed at [facility], [place] [address], [place], [place] [address]         Radiology Studies: DG Chest 2 View  Result Date: 07/24/2020 CLINICAL DATA:  Unwitnessed fall. EXAM: CHEST - 2 VIEW COMPARISON:  06/26/2020 FINDINGS: The cardio pericardial silhouette is enlarged. Diffuse interstitial opacity is associated with bibasilar collapse/consolidation and small bilateral pleural effusions. The visualized bony structures of the thorax show no acute abnormality. Telemetry leads overlie the chest. IMPRESSION: Findings suggest edema with bibasilar collapse/consolidation and small bilateral pleural effusions. Electronically Signed   By: Misty Stanley M.D.   On: 07/06/2020 20:37   CT Head Wo Contrast  Result Date: 07/18/2020 CLINICAL DATA:  Recent fall with headaches, initial encounter EXAM: CT HEAD WITHOUT CONTRAST TECHNIQUE: Contiguous axial images were obtained from the base of the skull through the vertex without intravenous contrast. COMPARISON:  06/26/2020 FINDINGS: Brain: No evidence of acute infarction, hemorrhage, hydrocephalus, extra-axial collection or mass lesion/mass effect. Vascular: No hyperdense vessel or unexpected calcification. Skull: Normal. Negative for fracture or focal lesion. Sinuses/Orbits: No acute finding. Other: Right frontal scalp hematoma  is noted improved when compared with the prior exam. IMPRESSION: Improved right frontal scalp hematoma. No acute intracranial abnormality noted. Electronically Signed   By: Inez Catalina M.D.   On: 07/12/2020 18:41   DG Hand Complete Right  Result Date: 07/24/2020 CLINICAL DATA:  85 year old female with trauma. EXAM: RIGHT HAND - COMPLETE 3+ VIEW COMPARISON:  None. FINDINGS: There is avulsion fracture of the dorsal base of the middle phalanx of the fourth digit. No other acute fracture. There is advanced osteopenia. Degenerative changes of the interphalangeal joints with distal interphalangeal fusion of the fourth and fifth digits. There is mild soft tissue swelling of the fourth digit. No radiopaque foreign object or soft tissue gas. IMPRESSION: Avulsion fracture of the dorsal base of the middle phalanx of the fourth digit. Electronically Signed   By: Anner Crete M.D.   On: 07/08/2020 20:42        Scheduled Meds: . feeding supplement  237 mL Oral TID BM  . hydrALAZINE  10 mg Oral Q8H  . multivitamin with minerals  1 tablet Oral Daily  . potassium chloride  40 mEq Oral Q4H   Continuous Infusions: . sodium chloride 75 mL/hr at 07/14/2020 2256     LOS: 1 day     Cordelia Poche, MD [facility] 07/14/2020, 1:25 PM  If 7PM-7AM, please contact night-coverage [email or web address]

## 2020-07-14 NOTE — Evaluation (Signed)
Physical Therapy Evaluation Patient Details Name: JAMELLE MANOUKIAN MRN: [id number] DOB: 10/18/1935 Today's Date: 07/14/2020   History of Present Illness  Pt is an 85 yo female s/p fall on R hand avulsion fx of middle phalanx of 4th digit, B/L pleural effusions, pulmonary edema, acute CHF, hyponatremia, and AKI.  CT head negative; PMHx:  Afib, HTN.    Clinical Impression  Pt admitted with above. Pt with decreased strength, impaired balance, decreased insight to deficits and safety, and is at increased falls risk. Pt with noted SOB with mobility in addition to limited WBing on L LE and R UE. Pt to greatly benefit from SNF upon d/c to address weakness and impaired balance to progress pt back to indep. Acute PT to cont to follow.    Follow Up Recommendations SNF;Supervision/Assistance - 24 hour    Equipment Recommendations  Rolling walker with 5" wheels    Recommendations for Other Services       Precautions / Restrictions Precautions Precautions: Fall Restrictions Weight Bearing Restrictions: No Other Position/Activity Restrictions: pt self WBing on L heel due to pain in toe, self limiting with R hand due to 4th digit fracture      Mobility  Bed Mobility               General bed mobility comments: pt up in chair upon PT arrival    Transfers Overall transfer level: Needs assistance Equipment used: Rolling walker (2 wheeled) Transfers: Sit to/from [facility] Sit to Stand: Min assist         General transfer comment: minA to power up and steady from chair, pt with WBing through L heel due to pain in toe  Ambulation/Gait Ambulation/Gait assistance: Min assist;+2 safety/equipment (2nd person for O2 tank and IV pole) Gait Distance (Feet): 40 Feet Assistive device: Rolling walker (2 wheeled) Gait Pattern/deviations: Step-through pattern;Decreased stride length;Trunk flexed;Narrow base of support Gait velocity: dec Gait velocity interpretation: <1.31 ft/sec,  indicative of household ambulator General Gait Details: pt with narrow base of support and only weight bearing on L heel due to pain in toes. pts c/o SOB, SpO2 87% on 3LO2 via [place], pt very shaky requiring minA for walker management as well  Stairs            Wheelchair Mobility    Modified Rankin (Stroke Patients Only)       Balance Overall balance assessment: Needs assistance Sitting-balance support: Single extremity supported Sitting balance-Leahy Scale: Fair     Standing balance support: Bilateral upper extremity supported;Single extremity supported;During functional activity Standing balance-Leahy Scale: Poor Standing balance comment: requires RW or external support                             Pertinent Vitals/Pain Pain Assessment: Faces Faces Pain Scale: Hurts whole lot Pain Location: R hand Pain Descriptors / Indicators: Discomfort;Grimacing;Guarding Pain Intervention(s): Premedicated before session;Repositioned    Home Living Family/patient expects to be discharged to:: Private residence Living Arrangements: Alone Available Help at Discharge: Family;Available PRN/intermittently Type of Home: Apartment Home Access: Level entry     Home Layout: One level Home Equipment: Bedside commode      Prior Function Level of Independence: Independent with assistive device(s)         Comments: RW for mobility; reports doing a fair job of self care for myself, but she kept falling     Hand Dominance   Dominant Hand: Right    Extremity/Trunk  Assessment   Upper Extremity Assessment Upper Extremity Assessment: Generalized weakness RUE Deficits / Details: R ring finger fx, bruising and swelling; AROM limited in hand. AROM in shoulder through digits, WFLs. RUE Coordination: decreased fine motor    Lower Extremity Assessment Lower Extremity Assessment: Generalized weakness (L foot with bruising at 1st and 2nd toe)    Cervical / Trunk  Assessment Cervical / Trunk Assessment: Kyphotic  Communication   Communication: HOH  Cognition Arousal/Alertness: Awake/alert Behavior During Therapy: WFL for tasks assessed/performed Overall Cognitive Status: Impaired/Different from baseline Area of Impairment: Safety/judgement;Problem solving                         Safety/Judgement: Decreased awareness of safety;Decreased awareness of deficits   Problem Solving: Slow processing;Requires verbal cues;Requires tactile cues General Comments: pt following commands however with decreased awareness of deficits and safety. Pt aware she fell twice however states she will return home alone without going to rehab      General Comments General comments (skin integrity, edema, etc.): bruising t/o face, large know on R forehead, bruising/edema on R hand, 4th finger and L 1st, 2nd toe    Exercises     Assessment/Plan    PT Assessment Patient needs continued PT services  PT Problem List Decreased strength;Decreased balance;Decreased activity tolerance;Decreased mobility;Decreased coordination;Decreased cognition;Decreased knowledge of use of DME;Decreased safety awareness       PT Treatment Interventions DME instruction;Gait training;Stair training;Functional mobility training;Therapeutic activities;Therapeutic exercise;Balance training    PT Goals (Current goals can be found in the Care Plan section)  Acute Rehab PT Goals PT Goal Formulation: With patient Time For Goal Achievement: 07/28/20 Potential to Achieve Goals: Good    Frequency Min 3X/week   Barriers to discharge Decreased caregiver support lives alone, has no family who can assist, a sister who is 22yo    Co-evaluation               AM-PAC PT "6 Clicks" Mobility  Outcome Measure Help needed turning from your back to your side while in a flat bed without using bedrails?: A Little Help needed moving from lying on your back to sitting on the side of a flat  bed without using bedrails?: A Little Help needed moving to and from a bed to a chair (including a wheelchair)?: A Little Help needed standing up from a chair using your arms (e.g., wheelchair or bedside chair)?: A Little Help needed to walk in hospital room?: A Lot Help needed climbing 3-5 steps with a railing? : A Lot 6 Click Score: 16    End of Session Equipment Utilized During Treatment: Gait belt;Oxygen (3Lo2 via [place]) Activity Tolerance: Patient tolerated treatment well Patient left: in chair;with call bell/phone within reach;with chair alarm set Nurse Communication: Mobility status PT Visit Diagnosis: Muscle weakness (generalized) (M62.81);Difficulty in walking, not elsewhere classified (R26.2);Other symptoms and signs involving the nervous system (R29.898)    Time: [id number] PT Time Calculation (min) (ACUTE ONLY): 29 min   Charges:   PT Evaluation $PT Eval Moderate Complexity: 1 Mod PT Treatments $Gait Training: 8-22 mins        Kittie Plater, PT, DPT Acute Rehabilitation Services Pager #: [phone] Office #: [phone]   Berline Lopes 07/14/2020, 3:47 PM

## 2020-07-14 NOTE — Progress Notes (Signed)
Initial Nutrition Assessment  DOCUMENTATION CODES:   Non-severe (moderate) malnutrition in context of chronic illness  INTERVENTION:   -Downgrade diet to dysphagia 2 (mechanical soft) for ease of intake secondary to lack of teeth -Ensure Enlive po TID, each supplement provides 350 kcal and 20 grams of protein -MVI with minerals daily -Obtain new wt -Obtain height  NUTRITION DIAGNOSIS:   Moderate Malnutrition related to social / environmental circumstances as evidenced by mild fat depletion,moderate fat depletion,mild muscle depletion,moderate muscle depletion.  GOAL:   Patient will meet greater than or equal to 90% of their needs  MONITOR:   PO intake,Supplement acceptance,Labs,Weight trends,Skin,I & O's  REASON FOR ASSESSMENT:   Consult Assessment of nutrition requirement/status  ASSESSMENT:   [name] is a 85 y.o. female with medical history significant for chronic A. fib on Eliquis, essential hypertension, likely others, but no records available at the time of this dictation, who presented to [facility] ED from home due to recurrent falls x2 in the last 2 weeks.  Pt admitted with fall and intermittent dizziness.   Plan to consult orthopedics surgery for avulsion fracture of the dorsal base of middle phalanx on the fourth digit.   Reviewed I/O's: +650 ml x 24 hours  Spoke with pt, who was sitting in recliner chair at time of visit. She reports that she has experienced about a 20 pound weight loss over the past 2 months (per her report UBW is 145#). Unable to obtain an accurate weight at this time, as pt was in chair at time of visit.   Pt shares that she has no bottom teeth and has difficulty chewing. She reports she is able to consume soft foods, such as meatloaf and mashed potatoes, but food variety is limited due to what she is able to chew. She also consumes 2-3 strawberry Boost supplements daily.   Observed breakfast tray- pt consumed about 50% of meal.   Discussed  importance of good meal and supplement intake to promote healing.   Medications reviewed and include cardizem.   Labs reviewed: Na: 128, K: 2.9 (on IV supplementation).   NUTRITION - FOCUSED PHYSICAL EXAM:  Flowsheet Row Most Recent Value  Orbital Region Mild depletion  Upper Arm Region Moderate depletion  Thoracic and Lumbar Region Mild depletion  Buccal Region Mild depletion  Temple Region Moderate depletion  Clavicle Bone Region Moderate depletion  Clavicle and Acromion Bone Region Moderate depletion  Scapular Bone Region Moderate depletion  Dorsal Hand Mild depletion  Patellar Region Moderate depletion  Anterior Thigh Region Moderate depletion  Posterior Calf Region Moderate depletion  Edema (RD Assessment) Mild  Hair Reviewed  Eyes Reviewed  Mouth Reviewed  Skin Reviewed  Nails Reviewed       Diet Order:   Diet Order            DIET DYS 2 Room service appropriate? Yes with Assist; Fluid consistency: Thin  Diet effective now                 EDUCATION NEEDS:   Education needs have been addressed  Skin:  Skin Assessment: Reviewed RN Assessment  Last BM:  Unknown  Height:   Ht Readings from Last 1 Encounters:  No data found for Ht    Weight:   Wt Readings from Last 1 Encounters:  No data found for Wt   BMI:  There is no height or weight on file to calculate BMI.  [name], RD, LDN, [place] Registered Dietitian II Certified Diabetes  Care and Education Specialist Please refer to [facility] for RD and/or RD on-call/weekend/after hours pager

## 2020-07-14 NOTE — Progress Notes (Signed)
Orthopedic Tech Progress Note Patient Details:  [name] 25-Jan-1936 [id number] Went to apply finger splint to patient she refused due to pain. Left finger splint in room notified RN Patient ID: [name], female   DOB: 21-Mar-1935, 85 y.o.   MRN: [id number]   [name] 07/14/2020, 7:05 PM

## 2020-07-14 NOTE — Progress Notes (Signed)
[facility] Hospital Liaison note:  This is a pending outpatient-based Palliative Care patient. Will continue to follow for disposition.  Please call with any outpatient palliative questions or concerns.  Thank you, Lorelee Market, LPN [facility] Liaison [phone]

## 2020-07-14 NOTE — Plan of Care (Signed)
Patient BP elevated. Dr. aware. PRNs given. Potassium 2.9 notified Dr. Lonny Prude at shift change will make on coming nurse aware of lab result.  Problem: Education: Goal: Knowledge of General Education information will improve Description: Including pain rating scale, medication(s)/side effects and non-pharmacologic comfort measures Outcome: Progressing   Problem: Activity: Goal: Risk for activity intolerance will decrease Outcome: Progressing   Problem: Pain Managment: Goal: General experience of comfort will improve Outcome: Progressing   Problem: Safety: Goal: Ability to remain free from injury will improve Outcome: Progressing   Problem: Skin Integrity: Goal: Risk for impaired skin integrity will decrease Outcome: Progressing

## 2020-07-14 NOTE — Telephone Encounter (Signed)
Palliative NP arrived to patient's home, no answer at door. NP had spoken with patient yesterday afternoon to confirm visit. NP spoke with patient's sister [name]; patient was taken to hospital last night after falling. Will make hospital liaison aware.

## 2020-07-14 NOTE — Evaluation (Signed)
Occupational Therapy Evaluation Patient Details Name: [name] MRN: [id number] DOB: 05-19-1935 Today's Date: 07/14/2020    History of Present Illness Pt is an 85 yo female s/p fall on R hand avulsion fx of middle phalanx of 4th digit, B/L pleural effusions, pulmonary edema, acute CHF, hyponatremia, and AKI.  CT head negative; PMHx:  Afib, HTN.   Clinical Impression   Pt PTA: Pt living alone and reports independence until recent falls. Pt currently, set-upA to modA for ADL; minA for sit to stand and modA for transfers. Pt slightly impulsive.  Pt following all commands, but unrealistic about expectations for getting back into mobility. Pt standing shakily and asking "why am I like this?" Pt wanting to get up before OT was ready. Pt limited by decreased strength, decreased ability to care for self and decreased activity tolerance. Pt with increased pain. Pt would greatly benefit from continued OT skilled services. OT following acutely. * pt hesitant about SNF, but reported to have no family/friend support at this time and unable to safely be at home without falls.     Follow Up Recommendations  SNF;Supervision/Assistance - 24 hour    Equipment Recommendations  None recommended by OT    Recommendations for Other Services       Precautions / Restrictions Precautions Precautions: Fall Restrictions Weight Bearing Restrictions: No Other Position/Activity Restrictions: pt self WBing on L heel due to pain in toe, self limiting with R hand due to 4th digit fracture      Mobility Bed Mobility Overal bed mobility: Needs Assistance Bed Mobility: Supine to Sit     Supine to sit: Supervision     General bed mobility comments: no physical assist, increased time    Transfers Overall transfer level: Needs assistance Equipment used: 1 person hand held assist Transfers: Sit to/from [facility] Sit to Stand: Min assist Stand pivot transfers: Mod assist       General  transfer comment: ModA for stand pivot to recliner; pt standing shakily with hands on BSC for toilet hygiene.    Balance Overall balance assessment: Needs assistance Sitting-balance support: Single extremity supported Sitting balance-Leahy Scale: Fair     Standing balance support: Bilateral upper extremity supported;Single extremity supported;During functional activity Standing balance-Leahy Scale: Poor Standing balance comment: Pt attempting to stand with single UE and no UE supported for LB dressing                           ADL either performed or assessed with clinical judgement   ADL Overall ADL's : Needs assistance/impaired Eating/Feeding: Set up;Sitting   Grooming: Minimal assistance;Sitting   Upper Body Bathing: Minimal assistance;Sitting   Lower Body Bathing: Moderate assistance;Cueing for safety;Sitting/lateral leans;Sit to/from stand   Upper Body Dressing : Minimal assistance;Sitting   Lower Body Dressing: Moderate assistance;Cueing for safety;Sitting/lateral leans;Sit to/from stand Lower Body Dressing Details (indicate cue type and reason): attempting to donn underwear, but LOB Toilet Transfer: Moderate assistance;Stand-pivot;BSC   Toileting- Clothing Manipulation and Hygiene: Moderate assistance;Sitting/lateral lean;Sit to/from stand;Cueing for safety       Functional mobility during ADLs: Moderate assistance (HHA) General ADL Comments: Pt limited by decreased strength, decreased ability to care for self and decreased activity tolerance. Pt with increased pain.     Vision Baseline Vision/History: Macular Degeneration Patient Visual Report: Blurring of vision Vision Assessment?: Yes Eye Alignment: Within Functional Limits Ocular Range of Motion: Within Functional Limits Additional Comments: reports blurriness in R eye "the  good eye"     Perception     Praxis      Pertinent Vitals/Pain Pain Assessment: Faces Faces Pain Scale: Hurts even  more Pain Location: R hand Pain Descriptors / Indicators: Discomfort;Grimacing;Guarding Pain Intervention(s): Premedicated before session;Repositioned     Hand Dominance Right   Extremity/Trunk Assessment Upper Extremity Assessment Upper Extremity Assessment: Generalized weakness RUE Deficits / Details: R ring finger fx, bruising and swellin; AROM limited in hand. AROM in shoulder through digits, WFLs. RUE Coordination: decreased fine motor   Lower Extremity Assessment Lower Extremity Assessment: Generalized weakness   Cervical / Trunk Assessment Cervical / Trunk Assessment: Kyphotic   Communication Communication Communication: HOH   Cognition Arousal/Alertness: Awake/alert Behavior During Therapy: WFL for tasks assessed/performed Overall Cognitive Status: Impaired/Different from baseline Area of Impairment: Safety/judgement;Problem solving                         Safety/Judgement: Decreased awareness of safety;Decreased awareness of deficits   Problem Solving: Slow processing;Requires verbal cues;Requires tactile cues General Comments: Pt following all commands, but unrealistic about expectations for getting back into mobility. Pt standing shakily and asking "why am I like this?" Pt wanting to get up before OT was ready.   General Comments  frail physique    Exercises     Shoulder Instructions      Home Living Family/patient expects to be discharged to:: Private residence Living Arrangements: Alone Available Help at Discharge: Family;Available PRN/intermittently Type of Home: Apartment Home Access: Level entry     Home Layout: One level     Bathroom Shower/Tub: Teacher, early years/pre: Standard     Home Equipment: Bedside commode          Prior Functioning/Environment Level of Independence: Independent with assistive device(s)        Comments: RW for mobility; reports doing a fair job of self care for myself, but she kept falling         OT Problem List: Decreased strength;Decreased activity tolerance;Impaired balance (sitting and/or standing);Decreased safety awareness;Pain;Impaired vision/perception;Decreased cognition;Cardiopulmonary status limiting activity      OT Treatment/Interventions: Self-care/ADL training;Therapeutic exercise;DME and/or AE instruction;Therapeutic activities;Cognitive remediation/compensation;Visual/perceptual remediation/compensation;Patient/family education;Balance training;Energy conservation    OT Goals(Current goals can be found in the care plan section) Acute Rehab OT Goals Patient Stated Goal: to get out of the bed OT Goal Formulation: With patient Time For Goal Achievement: 07/28/20 Potential to Achieve Goals: Good ADL Goals Pt Will Perform Grooming: with min guard assist;standing Pt Will Perform Lower Body Dressing: with min assist;sit to/from stand;sitting/lateral leans Pt Will Transfer to Toilet: with supervision;stand pivot transfer Pt Will Perform Toileting - Clothing Manipulation and hygiene: with min guard assist;sit to/from stand Additional ADL Goal #1: Pt will perform OOB ADL with supervisionA to increase independence. Additional ADL Goal #2: Pt to increase standing tolerance x5 mins to increase activity tolerance for OOB ADL.  OT Frequency: Min 2X/week   Barriers to D/C: Decreased caregiver support          Co-evaluation              AM-PAC OT "6 Clicks" Daily Activity     Outcome Measure Help from another person eating meals?: A Little Help from another person taking care of personal grooming?: A Little Help from another person toileting, which includes using toliet, bedpan, or urinal?: A Lot Help from another person bathing (including washing, rinsing, drying)?: A Lot Help from another person to put on and  taking off regular upper body clothing?: A Little Help from another person to put on and taking off regular lower body clothing?: A Lot 6 Click  Score: 15   End of Session Equipment Utilized During Treatment: Gait belt;Rolling walker Nurse Communication: Mobility status  Activity Tolerance: Patient limited by pain Patient left: in chair;with call bell/phone within reach;with chair alarm set  OT Visit Diagnosis: Unsteadiness on feet (R26.81);Muscle weakness (generalized) (M62.81)                Time: [id number] OT Time Calculation (min): 45 min Charges:  OT General Charges $OT Visit: 1 Visit OT Evaluation $OT Eval Moderate Complexity: 1 Mod OT Treatments $Self Care/Home Management : 8-22 mins $Therapeutic Activity: 8-22 mins  Jefferey Pica, OTR/L Acute Rehabilitation Services Pager: [phone] Office: [phone]   Eryanna Regal C 07/14/2020, 2:58 PM

## 2020-07-15 ENCOUNTER — Other Ambulatory Visit: Payer: Self-pay | Admitting: *Deleted

## 2020-07-15 ENCOUNTER — Inpatient Hospital Stay (HOSPITAL_COMMUNITY): Payer: Medicare Other

## 2020-07-15 DIAGNOSIS — E440 Moderate protein-calorie malnutrition: Secondary | ICD-10-CM | POA: Insufficient documentation

## 2020-07-15 DIAGNOSIS — R410 Disorientation, unspecified: Secondary | ICD-10-CM

## 2020-07-15 DIAGNOSIS — R58 Hemorrhage, not elsewhere classified: Secondary | ICD-10-CM

## 2020-07-15 DIAGNOSIS — F419 Anxiety disorder, unspecified: Secondary | ICD-10-CM

## 2020-07-15 DIAGNOSIS — E871 Hypo-osmolality and hyponatremia: Principal | ICD-10-CM

## 2020-07-15 DIAGNOSIS — J449 Chronic obstructive pulmonary disease, unspecified: Secondary | ICD-10-CM

## 2020-07-15 DIAGNOSIS — I5032 Chronic diastolic (congestive) heart failure: Secondary | ICD-10-CM

## 2020-07-15 DIAGNOSIS — I5022 Chronic systolic (congestive) heart failure: Secondary | ICD-10-CM

## 2020-07-15 LAB — BASIC METABOLIC PANEL
Anion gap: 12 (ref 5–15)
Anion gap: 11 (ref 5–15)
BUN: 22 mg/dL (ref 8–23)
BUN: 23 mg/dL (ref 8–23)
CO2: 29 mmol/L (ref 22–32)
CO2: 27 mmol/L (ref 22–32)
Calcium: 8.4 mg/dL — ABNORMAL LOW (ref 8.9–10.3)
Calcium: 8.5 mg/dL — ABNORMAL LOW (ref 8.9–10.3)
Chloride: 86 mmol/L — ABNORMAL LOW (ref 98–111)
Chloride: 84 mmol/L — ABNORMAL LOW (ref 98–111)
Creatinine, Ser: 1.6 mg/dL — ABNORMAL HIGH (ref 0.44–1.00)
Creatinine, Ser: 1.87 mg/dL — ABNORMAL HIGH (ref 0.44–1.00)
GFR, Estimated: 32 mL/min — ABNORMAL LOW (ref >60)
GFR, Estimated: 27 mL/min — ABNORMAL LOW (ref >60)
Glucose, Bld: 69 mg/dL — ABNORMAL LOW (ref 70–99)
Glucose, Bld: 295 mg/dL — ABNORMAL HIGH (ref 70–99)
Potassium: 3.4 mmol/L — ABNORMAL LOW (ref 3.5–5.1)
Potassium: 3.9 mmol/L (ref 3.5–5.1)
Sodium: 127 mmol/L — ABNORMAL LOW (ref 135–145)
Sodium: 122 mmol/L — ABNORMAL LOW (ref 135–145)

## 2020-07-15 LAB — MAGNESIUM: Magnesium: 1.9 mg/dL (ref 1.7–2.4)

## 2020-07-15 LAB — CBC
HCT: 34.2 % — ABNORMAL LOW (ref 36.0–46.0)
Hemoglobin: 11.2 g/dL — ABNORMAL LOW (ref 12.0–15.0)
MCH: 27.4 pg (ref 26.0–34.0)
MCHC: 32.7 g/dL (ref 30.0–36.0)
MCV: 83.6 fL (ref 80.0–100.0)
Platelets: 362 10*3/uL (ref 150–400)
RBC: 4.09 MIL/uL (ref 3.87–5.11)
RDW: 21.4 % — ABNORMAL HIGH (ref 11.5–15.5)
WBC: 10.3 10*3/uL (ref 4.0–10.5)
nRBC: 0 % (ref 0.0–0.2)

## 2020-07-15 LAB — SODIUM, URINE, RANDOM: Sodium, Ur: 41 mmol/L

## 2020-07-15 LAB — OSMOLALITY, URINE: Osmolality, Ur: 221 mOsm/kg — ABNORMAL LOW (ref 300–900)

## 2020-07-15 MED ORDER — POLYETHYLENE GLYCOL 3350 17 G PO PACK
17.0000 g | PACK | Freq: Every day | ORAL | Status: DC
  Administered 2020-07-16: 17 g via ORAL
  Filled 2020-07-15: qty 1

## 2020-07-15 MED ORDER — HYDRALAZINE HCL 50 MG PO TABS
50.0000 mg | ORAL_TABLET | Freq: Three times a day (TID) | ORAL | Status: DC
  Administered 2020-07-15 – 2020-07-17 (×7): 50 mg via ORAL
  Filled 2020-07-15 (×7): qty 1

## 2020-07-15 NOTE — Progress Notes (Signed)
Paged for change in mental status. Patient confused. Per nurse, she started complaining of blury vision and decreased hearing. No neurologic deficits noted. At bedside, patient is in chair being fed lunch, not answering questions appropriately. Hearing is impaired. No focal neurologic deficits noted and pupils are equal and reactive. Patient continually answers questions by reciting a phone number. While in the room, patient's sister came in and stated she has had confusion issues since her falls. No known dementia diagnosis. CT head on admission without acute pathology.  -BMP -CT head in setting of Eliquis use; if negative will consider MRI and/or neurology consult. It is possible she may just have underlying cognitive impairment/dementia with sundowning.  Cordelia Poche, MD [facility] 07/15/2020, 3:09 PM

## 2020-07-15 NOTE — Progress Notes (Addendum)
PROGRESS NOTE    DANIXA LAFOSSE  [id number] DOB: 1935-05-16 DOA: 07/27/2020 PCP: Cassandria Anger, MD   Brief Narrative: [name] is a 85 y.o. female with a history of paroxysmal atrial fibrillation, more thrombus on Eliquis, pulm hypertension, hypothyroidism, hyperlipidemia, anxiety, significant COPD, chronic systolic heart failure.  Patient presents secondary to dizziness which is a recurrent issue.  Complicated by Eliquis use.  Patient was recently in the hospital for similar presentation.  On admission, patient found to have an avulsion fracture of right third digit in addition to hyponatremia.   Assessment & Plan:   Active Problems:   Fall   Hyponatremia   AF (paroxysmal atrial fibrillation) (HCC)   Chronic systolic CHF (congestive heart failure) (HCC)   Hypothyroidism   COPD (chronic obstructive pulmonary disease) (HCC)   Anxiety   Hyperlipidemia   Malnutrition of moderate degree  Fall Non-syncopal although patient has had lightheadedness prior to falling. She has fallen multiple times in as many weeks. Patient is on multiple antihypertensives which are possibly contributing. -PT/OT recommendations: SNF  Avulsion fracture of right fourth digit In setting of fall. Patient with some pain and swelling. Discussed with orthopedic surgery who recommended splint in extension and outpatient follow-up with Dr. Caralyn Guile in 2 weeks. Use finger as tolerated. -Splint  Hyponatremia Unsure of etiology. Patient with some evidence of hypervolemia but she has had some improvement with IV fluids. Albumin also low. -Hold IV fluids; continue torsemide -BMP in AM  Left second toe swelling Concern for fracture. X-ray of foot is negative for acute fracture.  Chronic respiratory failure with hypoxia Stable at 3 Lpm via [place]  History of mural thrombus Paroxysmal atrial fibrillation Patient is currently on Eliquis 2.5 mg BID as an outpatient which was recommended by Cardiology in  06/2020. It appears metoprolol was discontinued on last admission discharge. -Continue Eliquis 2.5 mg BID -Continue home amiodarone and diltiazem -Hold Metoprolol  Primary hypertension Patient is on amlodipine, diltiazem, hydralazine, Imdur and metoprolol as an outpatient. On chart review, metoprolol appears to have been recommended for discontinuation. Possibly complicating falls if regimen is causing postural hypotension -Continue home diltiazem as mentioned above -Continue home Imdur, amlodipine, hydralazine (increase to home dose of 50 mg TID) -Hold metoprolol  Chronic systolic heart failure Last EF of 55-60% which was improved from previous EF of 35-40%. Patient is on torsemide, potassium as an outpatient. -Continue home torsemide and potassium  Right forehead hematoma Improved from one month prior but likely slow to improve secondary to Eliquis use.  Hypothyroidism TSH of 5.552 on admission. Patient is on synthroid. Although she has hyponatremia, unsure this is the cause. With history of atrial fibrillation, and the fact that TSH is down from peak of around 7.7 in January, will opt to continue current dosing. -Continue Synthroid 25 mcg   Acute hemorrhage Secondary to accidental removal of her IV and complicated by Eliquis use. Bleeding stopped with continued pressure and elevation of arm. -CBC today, and repeat in AM  Anxiety -Continue klonopin qhs prn  Hyperlipidemia -Continue Crestor  COPD -Continue Trelegy ellipta and albuterol prn  GERD -Continue Protonix   DVT prophylaxis: Eliquis Code Status:   Code Status: Full Code Family Communication: None at bedside Disposition Plan: Discharge home vs SNF pending patient decision and improvement of sodium. Anticipate discharge in 2-3 days   Consultants:  Orthopedic surgery  Procedures:  None  Antimicrobials: None    Subjective: Finger still hurts. Declined finger splint yesterday.  While on  the floor, heard  discussion of patient having significant bleeding. Upon re-entering her room, patient was found to have significant hemorrhage from prior left hand IV site. Patient was unaware of how her IV came out.  Objective: Vitals:   07/14/20 2000 07/14/20 2200 07/15/20 0515 07/15/20 0606  BP: (!) 145/72 (!) 165/71 (!) 190/66 (!) 175/79  Pulse: 75  69   Resp: 18  15   Temp: 98.6 F (37 C)  (!) 97.5 F (36.4 C)   TempSrc: Oral  Oral   SpO2:   95%     Intake/Output Summary (Last 24 hours) at 07/15/2020 0757 Last data filed at 07/15/2020 [id number] Gross per 24 hour  Intake 1020.98 ml  Output 1000 ml  Net 20.98 ml   There were no vitals filed for this visit.  Examination:  General exam: Appears calm and comfortable and in no acute distress. Conversant Respiratory: Clear to auscultation. Respiratory effort normal with no intercostal retractions or use of accessory muscles Cardiovascular: S1 & S2 heard, RRR. No murmurs, rubs, gallops or clicks. Gastrointestinal: Abdomen is nondistended, soft and nontender. No masses felt. Normal bowel sounds heard Neurologic: No focal neurological deficits Musculoskeletal: No calf tenderness. Left hand IV site with mild-moderate amounts of blood. Moderate amount of blood on the floor. Skin: No cyanosis. No new rashes Psychiatry: Alert and oriented to person, place, month/year. Memory intact. Mood & affect appropriate   Data Reviewed: I have personally reviewed following labs and imaging studies  CBC Lab Results  Component Value Date   WBC 9.1 07/14/2020   RBC 3.62 (L) 07/14/2020   HGB 10.0 (L) 07/14/2020   HCT 30.2 (L) 07/14/2020   MCV 83.4 07/14/2020   MCH 27.6 07/14/2020   PLT 303 07/14/2020   MCHC 33.1 07/14/2020   RDW 21.4 (H) 07/14/2020   LYMPHSABS 0.7 07/11/2020   MONOABS 1.3 (H) 07/07/2020   EOSABS 0.0 07/21/2020   BASOSABS 0.0 [id number]     Last metabolic panel Lab Results  Component Value Date   NA 127 (L) 07/15/2020   K 3.4 (L)  07/15/2020   CL 86 (L) 07/15/2020   CO2 29 07/15/2020   BUN 22 07/15/2020   CREATININE 1.60 (H) 07/15/2020   GLUCOSE 69 (L) 07/15/2020   GFRNONAA 32 (L) 07/15/2020   CALCIUM 8.4 (L) 07/15/2020   PHOS 3.7 07/14/2020   PROT 5.5 (L) 07/14/2020   ALBUMIN 2.4 (L) 07/14/2020   BILITOT 0.6 07/14/2020   ALKPHOS 59 07/14/2020   AST 39 07/14/2020   ALT 27 07/14/2020   ANIONGAP 12 07/15/2020    CBG (last 3)  No results for input(s): GLUCAP in the last 72 hours.   GFR: CrCl cannot be calculated (Unknown ideal weight.).  Coagulation Profile: No results for input(s): INR, PROTIME in the last 168 hours.  Recent Results (from the past 240 hour(s))  Resp Panel by RT-PCR (Flu A&B, Covid) Nasopharyngeal Swab     Status: None   Collection Time: 07/04/2020  5:51 PM   Specimen: Nasopharyngeal Swab; Nasopharyngeal(NP) swabs in vial transport medium  Result Value Ref Range Status   SARS Coronavirus 2 by RT PCR NEGATIVE NEGATIVE Final    Comment: (NOTE) SARS-CoV-2 target nucleic acids are NOT DETECTED.  The SARS-CoV-2 RNA is generally detectable in upper respiratory specimens during the acute phase of infection. The lowest concentration of SARS-CoV-2 viral copies this assay can detect is 138 copies/mL. A negative result does not preclude SARS-Cov-2 infection and should not be used as the sole  basis for treatment or other patient management decisions. A negative result may occur with  improper specimen collection/handling, submission of specimen other than nasopharyngeal swab, presence of viral mutation(s) within the areas targeted by this assay, and inadequate number of viral copies(<138 copies/mL). A negative result must be combined with clinical observations, patient history, and epidemiological information. The expected result is Negative.  Fact Sheet for Patients:  EntrepreneurPulse.com.au  Fact Sheet for Healthcare Providers:   IncredibleEmployment.be  This test is no t yet approved or cleared by the [place] FDA and  has been authorized for detection and/or diagnosis of SARS-CoV-2 by FDA under an Emergency Use Authorization (EUA). This EUA will remain  in effect (meaning this test can be used) for the duration of the COVID-19 declaration under Section 564(b)(1) of the Act, 21 U.S.C.section 360bbb-3(b)(1), unless the authorization is terminated  or revoked sooner.       Influenza A by PCR NEGATIVE NEGATIVE Final   Influenza B by PCR NEGATIVE NEGATIVE Final    Comment: (NOTE) The Xpert Xpress SARS-CoV-2/FLU/RSV plus assay is intended as an aid in the diagnosis of influenza from Nasopharyngeal swab specimens and should not be used as a sole basis for treatment. Nasal washings and aspirates are unacceptable for Xpert Xpress SARS-CoV-2/FLU/RSV testing.  Fact Sheet for Patients: EntrepreneurPulse.com.au  Fact Sheet for Healthcare Providers: IncredibleEmployment.be  This test is not yet approved or cleared by the [place] FDA and has been authorized for detection and/or diagnosis of SARS-CoV-2 by FDA under an Emergency Use Authorization (EUA). This EUA will remain in effect (meaning this test can be used) for the duration of the COVID-19 declaration under Section 564(b)(1) of the Act, 21 U.S.C. section 360bbb-3(b)(1), unless the authorization is terminated or revoked.  Performed at [facility], [place] [address] [place], [place] [address]         Radiology Studies: DG Chest 2 View  Result Date: 07/06/2020 CLINICAL DATA:  Unwitnessed fall. EXAM: CHEST - 2 VIEW COMPARISON:  06/26/2020 FINDINGS: The cardio pericardial silhouette is enlarged. Diffuse interstitial opacity is associated with bibasilar collapse/consolidation and small bilateral pleural effusions. The visualized bony structures of the thorax show no acute abnormality.  Telemetry leads overlie the chest. IMPRESSION: Findings suggest edema with bibasilar collapse/consolidation and small bilateral pleural effusions. Electronically Signed   By: Misty Stanley M.D.   On: 07/21/2020 20:37   CT Head Wo Contrast  Result Date: 07/14/2020 CLINICAL DATA:  Recent fall with headaches, initial encounter EXAM: CT HEAD WITHOUT CONTRAST TECHNIQUE: Contiguous axial images were obtained from the base of the skull through the vertex without intravenous contrast. COMPARISON:  06/26/2020 FINDINGS: Brain: No evidence of acute infarction, hemorrhage, hydrocephalus, extra-axial collection or mass lesion/mass effect. Vascular: No hyperdense vessel or unexpected calcification. Skull: Normal. Negative for fracture or focal lesion. Sinuses/Orbits: No acute finding. Other: Right frontal scalp hematoma is noted improved when compared with the prior exam. IMPRESSION: Improved right frontal scalp hematoma. No acute intracranial abnormality noted. Electronically Signed   By: Inez Catalina M.D.   On: 07/29/2020 18:41   DG Hand Complete Right  Result Date: 07/26/2020 CLINICAL DATA:  85 year old female with trauma. EXAM: RIGHT HAND - COMPLETE 3+ VIEW COMPARISON:  None. FINDINGS: There is avulsion fracture of the dorsal base of the middle phalanx of the fourth digit. No other acute fracture. There is advanced osteopenia. Degenerative changes of the interphalangeal joints with distal interphalangeal fusion of the fourth and fifth digits. There is mild soft tissue swelling  of the fourth digit. No radiopaque foreign object or soft tissue gas. IMPRESSION: Avulsion fracture of the dorsal base of the middle phalanx of the fourth digit. Electronically Signed   By: Anner Crete M.D.   On: 07/17/2020 20:42   DG Foot Complete Left  Result Date: 07/14/2020 CLINICAL DATA:  Left foot pain EXAM: LEFT FOOT - COMPLETE 3+ VIEW COMPARISON:  None. FINDINGS: Postsurgical changes are noted in the head of the second  metatarsal. No acute fracture or dislocation is noted. No soft tissue abnormality is seen. Calcaneal spurring is noted. IMPRESSION: Postsurgical change without acute abnormality. Electronically Signed   By: Inez Catalina M.D.   On: 07/14/2020 19:59   ECHOCARDIOGRAM COMPLETE  Result Date: 07/14/2020    ECHOCARDIOGRAM REPORT   Patient Name:   TRIANNA MADEJA Date of Exam: 07/14/2020 Medical Rec #:  [id number]     Height: Accession #:    [id number]    Weight: Date of Birth:  Oct 20, 1935     BSA: Patient Age:    76 years      BP:           190/71 mmHg Patient Gender: F             HR:           61 bpm. Exam Location:  Inpatient Procedure: 2D Echo, 3D Echo, Cardiac Doppler, Color Doppler and Strain Analysis Indications:    CHF-Acute Diastolic [id number]  History:        Patient has no prior history of Echocardiogram examinations.                 COPD, Arrythmias:Atrial Fibrillation; Risk Factors:Dyslipidemia                 and Former Smoker. Hypothyroidism.  Sonographer:    Darlina Sicilian RDCS Referring Phys: [id number] [place]  1. Left ventricular ejection fraction, by estimation, is 50 to 55%. The left ventricle has low normal function. The left ventricle has no regional wall motion abnormalities. There is moderate left ventricular hypertrophy. Left ventricular diastolic parameters are consistent with Grade II diastolic dysfunction (pseudonormalization). Elevated left atrial pressure. The average left ventricular global longitudinal strain is -7.4 %. The global longitudinal strain is abnormal.  2. Right ventricular systolic function is normal. The right ventricular size is normal.  3. Left atrial size was mildly dilated.  4. Right atrial size was mildly dilated.  5. Moderate pleural effusion in the left lateral region.  6. The mitral valve is normal in structure. Mild mitral valve regurgitation. No evidence of mitral stenosis.  7. The aortic valve is tricuspid. Aortic valve regurgitation is mild. Mild to  moderate aortic valve sclerosis/calcification is present, without any evidence of aortic stenosis.  8. The inferior vena cava is normal in size with greater than 50% respiratory variability, suggesting right atrial pressure of 3 mmHg. FINDINGS  Left Ventricle: Left ventricular ejection fraction, by estimation, is 50 to 55%. The left ventricle has low normal function. The left ventricle has no regional wall motion abnormalities. The average left ventricular global longitudinal strain is -7.4 %.  The global longitudinal strain is abnormal. The left ventricular internal cavity size was normal in size. There is moderate left ventricular hypertrophy. Left ventricular diastolic parameters are consistent with Grade II diastolic dysfunction (pseudonormalization). Elevated left atrial pressure. Right Ventricle: The right ventricular size is normal.Right ventricular systolic function is normal. Left Atrium: Left atrial size was mildly dilated. Right Atrium: Right atrial  size was mildly dilated. Pericardium: There is no evidence of pericardial effusion. Mitral Valve: The mitral valve is normal in structure. Mild mitral valve regurgitation. No evidence of mitral valve stenosis. Tricuspid Valve: The tricuspid valve is normal in structure. Tricuspid valve regurgitation is mild . No evidence of tricuspid stenosis. Aortic Valve: The aortic valve is tricuspid. Aortic valve regurgitation is mild. Aortic regurgitation PHT measures 488 msec. Mild to moderate aortic valve sclerosis/calcification is present, without any evidence of aortic stenosis. Pulmonic Valve: The pulmonic valve was normal in structure. Pulmonic valve regurgitation is trivial. No evidence of pulmonic stenosis. Aorta: The aortic root is normal in size and structure. Venous: The inferior vena cava is normal in size with greater than 50% respiratory variability, suggesting right atrial pressure of 3 mmHg. IAS/Shunts: No atrial level shunt detected by color flow Doppler.  Additional Comments: There is a moderate pleural effusion in the left lateral region.  LEFT VENTRICLE PLAX 2D LVIDd:         4.00 cm      Diastology LVIDs:         2.80 cm      LV e' medial:    3.08 cm/s LV PW:         1.40 cm      LV E/e' medial:  19.7 LV IVS:        1.30 cm      LV e' lateral:   2.70 cm/s LVOT diam:     2.15 cm      LV E/e' lateral: 22.5 LV SV:         68 LVOT Area:     3.63 cm     2D Longitudinal Strain                             2D Strain GLS Avg:     -7.4 %  LV Volumes (MOD) LV vol d, MOD A2C: 101.0 ml LV vol d, MOD A4C: 113.0 ml LV vol s, MOD A2C: 42.2 ml LV vol s, MOD A4C: 65.9 ml LV SV MOD A2C:     58.8 ml LV SV MOD A4C:     113.0 ml LV SV MOD BP:      59.4 ml RIGHT VENTRICLE RV S prime:     10.40 cm/s TAPSE (M-mode): 1.9 cm LEFT ATRIUM             RIGHT ATRIUM LA diam:        4.10 cm RA Area:     19.40 cm LA Vol (A2C):   58.8 ml RA Volume:   58.50 ml LA Vol (A4C):   73.7 ml LA Biplane Vol: 65.7 ml  AORTIC VALVE LVOT Vmax:   79.70 cm/s LVOT Vmean:  52.300 cm/s LVOT VTI:    0.186 m AI PHT:      488 msec  AORTA Ao Root diam: 3.20 cm Ao Asc diam:  3.10 cm MITRAL VALVE MV Area (PHT): 4.86 cm    SHUNTS MV Decel Time: 156 msec    Systemic VTI:  0.19 m MV E velocity: 60.80 cm/s  Systemic Diam: 2.15 cm MV A velocity: 41.10 cm/s MV E/A ratio:  1.48 Kirk Ruths MD Electronically signed by Kirk Ruths MD Signature Date/Time: 07/14/2020/4:36:38 PM    Final         Scheduled Meds:  amiodarone  200 mg Oral Daily   apixaban  2.5 mg Oral BID   diltiazem  120 mg Oral Daily   feeding supplement  237 mL Oral TID BM   fluticasone furoate-vilanterol  1 puff Inhalation Daily   And   umeclidinium bromide  1 puff Inhalation Daily   hydrALAZINE  10 mg Oral Q8H   isosorbide mononitrate  30 mg Oral Daily   levothyroxine  25 mcg Oral Q0600   multivitamin with minerals  1 tablet Oral Daily   pantoprazole  40 mg Oral Daily   potassium chloride  40 mEq Oral BID   rosuvastatin  10 mg Oral QHS    torsemide  20 mg Oral q1800   torsemide  40 mg Oral Daily   Continuous Infusions:     LOS: 2 days     Cordelia Poche, MD [facility] 07/15/2020, 7:57 AM  If 7PM-7AM, please contact night-coverage [email or web address]

## 2020-07-15 NOTE — Plan of Care (Signed)

## 2020-07-15 NOTE — Progress Notes (Signed)
Physical Therapy Treatment Patient Details Name: [name] MRN: [id number] DOB: 09-Jun-1935 Today's Date: 07/15/2020    History of Present Illness Pt is an 85 yo female s/p fall on R hand avulsion fx of middle phalanx of 4th digit, B/L pleural effusions, pulmonary edema, acute CHF, hyponatremia, and AKI.  CT head negative; PMHx:  Afib, HTN.    PT Comments    Pt very anxious and restless upon PT arrival to room, perseverative on calling her sister, stating over and over "I can't remember her number, I just can't" even though pt's sister's number is written down in front of her. Pt using her R hand as a telephone, attempting to "dial" the phone by pressing on her knuckles, and PT unable to convince pt that it was not a telephone. PT performed strength screen which was grossly 3+/5 bilaterally, no sensory deficits noted on sensation screen UE/LE, smooth pursuits limited by pt's limited ability to follow commands today. Pt tolerated x1 sit<>stand today and pregait tasks only, unable to progress to gait secondary to pt's poor direction following, poor balance, and body-wide tremors. RN notified of PT findings, will continue to follow acutely.    Follow Up Recommendations  SNF;Supervision/Assistance - 24 hour     Equipment Recommendations  Rolling walker with 5" wheels    Recommendations for Other Services       Precautions / Restrictions Precautions Precautions: Fall Required Braces or Orthoses: Splint/Cast Splint/Cast: Finger splint on 4th R finger Restrictions Weight Bearing Restrictions: No    Mobility  Bed Mobility Overal bed mobility: Needs Assistance             General bed mobility comments: pt up in chair upon PT arrival    Transfers Overall transfer level: Needs assistance Equipment used: Rolling walker (2 wheeled) Transfers: Sit to/from Stand Sit to Stand: Mod assist         General transfer comment: Mod assist for power up and steadying upon standing, pt  with posterior and R LOB requiring PT intervention to correct.  Ambulation/Gait         Gait velocity: decr   General Gait Details: pregait tasks - standing marches, forward and backward stepping. Pt unable to progress to gait due to bodywide trembling and AMS. SpO290% and greater on 3-4LO2   Stairs             Wheelchair Mobility    Modified Rankin (Stroke Patients Only)       Balance Overall balance assessment: Needs assistance;History of Falls Sitting-balance support: Single extremity supported Sitting balance-Leahy Scale: Fair     Standing balance support: Bilateral upper extremity supported;Single extremity supported;During functional activity Standing balance-Leahy Scale: Poor Standing balance comment: requires RW or external support                            Cognition Arousal/Alertness: Awake/alert Behavior During Therapy: Restless;Anxious Overall Cognitive Status: Impaired/Different from baseline Area of Impairment: Safety/judgement;Problem solving;Orientation;Attention;Memory;Following commands;Awareness                 Orientation Level: Disoriented to;Situation Current Attention Level: Focused Memory: Decreased short-term memory Following Commands: Follows one step commands inconsistently Safety/Judgement: Decreased awareness of safety;Decreased awareness of deficits Awareness: Intellectual Problem Solving: Slow processing;Requires verbal cues;Requires tactile cues General Comments: Pt trembling body-wide, stating she felt anxious and nervous but unable to state why. Pt perseverative on calling her sister, stating over and over "I can't remember her number, I  just can't" even though pt's sister's number is written down in front of her. Finger splint on 4th R finger, pt pulling on splint and pushing finger even with cues for pt to stop. Pt using her R hand as a telephone, attempting to "dial" the phone by pressing on her knuckles. Pt  states her sister should be coming from the beauty salon soon, in/out of orientation to situation.      Exercises      General Comments        Pertinent Vitals/Pain Pain Assessment: No/denies pain Pain Intervention(s): Monitored during session    Home Living                      Prior Function            PT Goals (current goals can now be found in the care plan section) Acute Rehab PT Goals PT Goal Formulation: With patient Time For Goal Achievement: 07/28/20 Potential to Achieve Goals: Good Progress towards PT goals: Not progressing toward goals - comment (very AMS)    Frequency    Min 3X/week      PT Plan Current plan remains appropriate    Co-evaluation              AM-PAC PT "6 Clicks" Mobility   Outcome Measure  Help needed turning from your back to your side while in a flat bed without using bedrails?: A Little Help needed moving from lying on your back to sitting on the side of a flat bed without using bedrails?: A Little Help needed moving to and from a bed to a chair (including a wheelchair)?: A Lot Help needed standing up from a chair using your arms (e.g., wheelchair or bedside chair)?: A Lot Help needed to walk in hospital room?: A Lot Help needed climbing 3-5 steps with a railing? : A Lot 6 Click Score: 14    End of Session Equipment Utilized During Treatment: Oxygen (3Lo2 via [place]) Activity Tolerance: Other (comment) (AMS, anxiety) Patient left: in chair;with call bell/phone within reach;with chair alarm set Nurse Communication: Mobility status PT Visit Diagnosis: Muscle weakness (generalized) (M62.81);Difficulty in walking, not elsewhere classified (R26.2);Other symptoms and signs involving the nervous system (R29.898)     Time: [id number] PT Time Calculation (min) (ACUTE ONLY): 32 min  Charges:  $Therapeutic Activity: 8-22 mins                     Stacie Glaze, PT DPT Acute Rehabilitation Services Pager [phone]  Office  [phone]    [place] 07/15/2020, 4:32 PM

## 2020-07-15 NOTE — Telephone Encounter (Signed)
DME order for portable oxygen tank faxed to [phone].

## 2020-07-15 NOTE — Discharge Instructions (Signed)

## 2020-07-16 ENCOUNTER — Inpatient Hospital Stay (HOSPITAL_COMMUNITY): Payer: Medicare Other

## 2020-07-16 DIAGNOSIS — E440 Moderate protein-calorie malnutrition: Secondary | ICD-10-CM

## 2020-07-16 LAB — ALBUMIN: Albumin: 2.7 g/dL — ABNORMAL LOW (ref 3.5–5.0)

## 2020-07-16 LAB — BLOOD GAS, ARTERIAL
Acid-Base Excess: 10.3 mmol/L — ABNORMAL HIGH (ref 0.0–2.0)
Bicarbonate: 33.9 mmol/L — ABNORMAL HIGH (ref 20.0–28.0)
Drawn by: 55062
FIO2: 40
O2 Saturation: 85.8 %
Patient temperature: 37
pCO2 arterial: 41.4 mmHg (ref 32.0–48.0)
pH, Arterial: 7.523 — ABNORMAL HIGH (ref 7.350–7.450)
pO2, Arterial: 48.9 mmHg — ABNORMAL LOW (ref 83.0–108.0)

## 2020-07-16 LAB — CBC
HCT: 33.7 % — ABNORMAL LOW (ref 36.0–46.0)
Hemoglobin: 11.2 g/dL — ABNORMAL LOW (ref 12.0–15.0)
MCH: 27.5 pg (ref 26.0–34.0)
MCHC: 33.2 g/dL (ref 30.0–36.0)
MCV: 82.6 fL (ref 80.0–100.0)
Platelets: 371 10*3/uL (ref 150–400)
RBC: 4.08 MIL/uL (ref 3.87–5.11)
RDW: 21.2 % — ABNORMAL HIGH (ref 11.5–15.5)
WBC: 11.9 10*3/uL — ABNORMAL HIGH (ref 4.0–10.5)
nRBC: 0 % (ref 0.0–0.2)

## 2020-07-16 LAB — BASIC METABOLIC PANEL
Anion gap: 11 (ref 5–15)
BUN: 24 mg/dL — ABNORMAL HIGH (ref 8–23)
CO2: 31 mmol/L (ref 22–32)
Calcium: 9.3 mg/dL (ref 8.9–10.3)
Chloride: 87 mmol/L — ABNORMAL LOW (ref 98–111)
Creatinine, Ser: 1.7 mg/dL — ABNORMAL HIGH (ref 0.44–1.00)
GFR, Estimated: 30 mL/min — ABNORMAL LOW (ref >60)
Glucose, Bld: 118 mg/dL — ABNORMAL HIGH (ref 70–99)
Potassium: 4.1 mmol/L (ref 3.5–5.1)
Sodium: 129 mmol/L — ABNORMAL LOW (ref 135–145)

## 2020-07-16 LAB — OSMOLALITY, URINE: Osmolality, Ur: 294 mOsm/kg — ABNORMAL LOW (ref 300–900)

## 2020-07-16 LAB — CORTISOL: Cortisol, Plasma: 33.7 ug/dL

## 2020-07-16 LAB — OSMOLALITY: Osmolality: 278 mOsm/kg (ref 275–295)

## 2020-07-16 LAB — SODIUM, URINE, RANDOM: Sodium, Ur: 21 mmol/L

## 2020-07-16 NOTE — Plan of Care (Signed)
  Problem: Education: Goal: Knowledge of General Education information will improve Description: Including pain rating scale, medication(s)/side effects and non-pharmacologic comfort measures Outcome: Progressing   Problem: Pain Managment: Goal: General experience of comfort will improve Outcome: Progressing   Problem: Activity: Goal: Risk for activity intolerance will decrease Outcome: Not Progressing   Problem: Nutrition: Goal: Adequate nutrition will be maintained Outcome: Not Progressing   Problem: Coping: Goal: Level of anxiety will decrease Outcome: Not Progressing

## 2020-07-16 NOTE — Progress Notes (Signed)
PROGRESS NOTE    [name]  [id number] DOB: 01-Sep-1935 DOA: 07/15/2020 PCP: Cassandria Anger, MD   Brief Narrative: [name] is a 85 y.o. female with a history of paroxysmal atrial fibrillation, more thrombus on Eliquis, pulm hypertension, hypothyroidism, hyperlipidemia, anxiety, significant COPD, chronic systolic heart failure.  Patient presents secondary to dizziness which is a recurrent issue.  Complicated by Eliquis use.  Patient was recently in the hospital for similar presentation.  On admission, patient found to have an avulsion fracture of right third digit in addition to hyponatremia.  07/16/2020: Patient seen.  Sodium has improved to 129.  Patient is both on torsemide 40 Mg daily and 20 Mg daily.  Will discontinue torsemide 40 Mg daily and Continue torsemide 20 Mg orally daily.  We will continue to assess need for diuretics daily.  We will repeat urine sodium, urine and serum osmolality, cortisol level.  Suspect patient may have component of SIADH.  We will have low threshold to start patient on sodium chloride tablets.  No significant history from patient.  Assessment & Plan:   Active Problems:   Fall   Hyponatremia   AF (paroxysmal atrial fibrillation) (HCC)   Chronic systolic CHF (congestive heart failure) (HCC)   Hypothyroidism   COPD (chronic obstructive pulmonary disease) (HCC)   Anxiety   Hyperlipidemia   Malnutrition of moderate degree  Fall Non-syncopal although patient has had lightheadedness prior to falling. She has fallen multiple times in as many weeks. Patient is on multiple antihypertensives which are possibly contributing. -PT/OT recommendations: SNF  Avulsion fracture of right fourth digit In setting of fall. Patient with some pain and swelling. Discussed with orthopedic surgery who recommended splint in extension and outpatient follow-up with Dr. Caralyn Guile in 2 weeks. Use finger as tolerated. -Splint  Hyponatremia Unsure of etiology.  Patient with some evidence of hypervolemia but she has had some improvement with IV fluids. Albumin also low. -Hold IV fluids; continue torsemide -BMP in AM 07/16/2020: See above documentation.  Recurrent falls may be associated with hyponatremia.  Left second toe swelling Concern for fracture. X-ray of foot is negative for acute fracture.  Chronic respiratory failure with hypoxia Stable at 3 Lpm via [place]  History of mural thrombus Paroxysmal atrial fibrillation Patient is currently on Eliquis 2.5 mg BID as an outpatient which was recommended by Cardiology in 06/2020. It appears metoprolol was discontinued on last admission discharge. -Continue Eliquis 2.5 mg BID -Continue home amiodarone and diltiazem -Hold Metoprolol  Primary hypertension Patient is on amlodipine, diltiazem, hydralazine, Imdur and metoprolol as an outpatient. On chart review, metoprolol appears to have been recommended for discontinuation. Possibly complicating falls if regimen is causing postural hypotension -Continue home diltiazem as mentioned above -Continue home Imdur, amlodipine, hydralazine (increase to home dose of 50 mg TID) -Hold metoprolol 07/16/2020: Continue to optimize blood pressure.  Keep systolic blood pressure around 140 mmHg.  Chronic systolic heart failure Last EF of 55-60% which was improved from previous EF of 35-40%. Patient is on torsemide, potassium as an outpatient. -Continue home torsemide and potassium 07/16/2020: Currently, patient has mainly diastolic dysfunction.  Will be cautious with diuretics.  Will discontinue torsemide 40 Mg p.o. once daily.  Continue torsemide 20 Mg p.o. once daily.  Right forehead hematoma Improved from one month prior but likely slow to improve secondary to Eliquis use.  Hypothyroidism TSH of 5.552 on admission. Patient is on synthroid. Although she has hyponatremia, unsure this is the cause. With history of atrial  fibrillation, and the fact that TSH is down from  peak of around 7.7 in January, will opt to continue current dosing. -Continue Synthroid 25 mcg   Acute hemorrhage Secondary to accidental removal of her IV and complicated by Eliquis use. Bleeding stopped with continued pressure and elevation of arm. -CBC today, and repeat in AM  Anxiety -Continue klonopin qhs prn  Hyperlipidemia -Continue Crestor  COPD -Continue Trelegy ellipta and albuterol prn  GERD -Continue Protonix   DVT prophylaxis: Eliquis Code Status:   Code Status: Full Code Family Communication: None at bedside Disposition Plan: Discharge home vs SNF pending patient decision and improvement of sodium. Anticipate discharge in 2-3 days   Consultants:   Orthopedic surgery  Procedures:   None  Antimicrobials:  None    Subjective: Poor historian..  Objective: Vitals:   07/16/20 0700 07/16/20 0900 07/16/20 1210 07/16/20 1304  BP: (!) 183/59  (!) 172/57 (!) 161/58  Pulse: 68  65 60  Resp: '17  18 18  '$ Temp: 98.1 F (36.7 C)     TempSrc: Oral     SpO2: 90% 90% 98% 98%  Weight:      Height:        Intake/Output Summary (Last 24 hours) at 07/16/2020 1339 Last data filed at 07/15/2020 2300 Gross per 24 hour  Intake --  Output 0 ml  Net 0 ml   Filed Weights   07/15/20 0838  Weight: 44.8 kg    Examination:  General exam: Appears calm and comfortable and in no acute distress. Respiratory: Clear to auscultation.  Cardiovascular: S1 & S2 heard, soft systolic murmur.   Gastrointestinal: Obese, soft and nontender.  Organs are not palpable.   Neurologic: No focal neurological deficits Musculoskeletal: No significant edema.  Data Reviewed: I have personally reviewed following labs and imaging studies  CBC Lab Results  Component Value Date   WBC 11.9 (H) 07/16/2020   RBC 4.08 07/16/2020   HGB 11.2 (L) 07/16/2020   HCT 33.7 (L) 07/16/2020   MCV 82.6 07/16/2020   MCH 27.5 07/16/2020   PLT 371 07/16/2020   MCHC 33.2 07/16/2020   RDW 21.2 (H)  07/16/2020   LYMPHSABS 0.7 07/28/2020   MONOABS 1.3 (H) 07/26/2020   EOSABS 0.0 07/31/2020   BASOSABS 0.0 [id number]     Last metabolic panel Lab Results  Component Value Date   NA 129 (L) 07/16/2020   K 4.1 07/16/2020   CL 87 (L) 07/16/2020   CO2 31 07/16/2020   BUN 24 (H) 07/16/2020   CREATININE 1.70 (H) 07/16/2020   GLUCOSE 118 (H) 07/16/2020   GFRNONAA 30 (L) 07/16/2020   CALCIUM 9.3 07/16/2020   PHOS 3.7 07/14/2020   PROT 5.5 (L) 07/14/2020   ALBUMIN 2.7 (L) 07/16/2020   BILITOT 0.6 07/14/2020   ALKPHOS 59 07/14/2020   AST 39 07/14/2020   ALT 27 07/14/2020   ANIONGAP 11 07/16/2020    CBG (last 3)  No results for input(s): GLUCAP in the last 72 hours.   GFR: Estimated Creatinine Clearance: 18 mL/min (A) (by C-G formula based on SCr of 1.7 mg/dL (H)).  Coagulation Profile: No results for input(s): INR, PROTIME in the last 168 hours.  Recent Results (from the past 240 hour(s))  Resp Panel by RT-PCR (Flu A&B, Covid) Nasopharyngeal Swab     Status: None   Collection Time: 07/05/2020  5:51 PM   Specimen: Nasopharyngeal Swab; Nasopharyngeal(NP) swabs in vial transport medium  Result Value Ref Range Status   SARS Coronavirus  2 by RT PCR NEGATIVE NEGATIVE Final    Comment: (NOTE) SARS-CoV-2 target nucleic acids are NOT DETECTED.  The SARS-CoV-2 RNA is generally detectable in upper respiratory specimens during the acute phase of infection. The lowest concentration of SARS-CoV-2 viral copies this assay can detect is 138 copies/mL. A negative result does not preclude SARS-Cov-2 infection and should not be used as the sole basis for treatment or other patient management decisions. A negative result may occur with  improper specimen collection/handling, submission of specimen other than nasopharyngeal swab, presence of viral mutation(s) within the areas targeted by this assay, and inadequate number of viral copies(<138 copies/mL). A negative result must be combined  with clinical observations, patient history, and epidemiological information. The expected result is Negative.  Fact Sheet for Patients:  EntrepreneurPulse.com.au  Fact Sheet for Healthcare Providers:  IncredibleEmployment.be  This test is no t yet approved or cleared by the [place] FDA and  has been authorized for detection and/or diagnosis of SARS-CoV-2 by FDA under an Emergency Use Authorization (EUA). This EUA will remain  in effect (meaning this test can be used) for the duration of the COVID-19 declaration under Section 564(b)(1) of the Act, 21 U.S.C.section 360bbb-3(b)(1), unless the authorization is terminated  or revoked sooner.       Influenza A by PCR NEGATIVE NEGATIVE Final   Influenza B by PCR NEGATIVE NEGATIVE Final    Comment: (NOTE) The Xpert Xpress SARS-CoV-2/FLU/RSV plus assay is intended as an aid in the diagnosis of influenza from Nasopharyngeal swab specimens and should not be used as a sole basis for treatment. Nasal washings and aspirates are unacceptable for Xpert Xpress SARS-CoV-2/FLU/RSV testing.  Fact Sheet for Patients: EntrepreneurPulse.com.au  Fact Sheet for Healthcare Providers: IncredibleEmployment.be  This test is not yet approved or cleared by the [place] FDA and has been authorized for detection and/or diagnosis of SARS-CoV-2 by FDA under an Emergency Use Authorization (EUA). This EUA will remain in effect (meaning this test can be used) for the duration of the COVID-19 declaration under Section 564(b)(1) of the Act, 21 U.S.C. section 360bbb-3(b)(1), unless the authorization is terminated or revoked.  Performed at [facility], [place] [address], [place], [place] [address]         Radiology Studies: CT HEAD WO CONTRAST  Result Date: 07/15/2020 CLINICAL DATA:  Mental status changes of unknown cause.  Falling. EXAM: CT HEAD WITHOUT CONTRAST  TECHNIQUE: Contiguous axial images were obtained from the base of the skull through the vertex without intravenous contrast. COMPARISON:  07/10/2020 FINDINGS: Brain: Age related atrophy. No focal infarction. No mass, hemorrhage, hydrocephalus or extra-axial collection. Vascular: There is atherosclerotic calcification of the major vessels at the base of the brain. Skull: No skull fracture. Sinuses/Orbits: Clear/normal Other: Right frontal scalp hematoma. IMPRESSION: Right frontal scalp hematoma. No underlying skull fracture. No intracranial traumatic finding. Age related atrophy. Electronically Signed   By: Nelson Chimes M.D.   On: 07/15/2020 17:22   DG Foot Complete Left  Result Date: 07/14/2020 CLINICAL DATA:  Left foot pain EXAM: LEFT FOOT - COMPLETE 3+ VIEW COMPARISON:  None. FINDINGS: Postsurgical changes are noted in the head of the second metatarsal. No acute fracture or dislocation is noted. No soft tissue abnormality is seen. Calcaneal spurring is noted. IMPRESSION: Postsurgical change without acute abnormality. Electronically Signed   By: Inez Catalina M.D.   On: 07/14/2020 19:59   ECHOCARDIOGRAM COMPLETE  Result Date: 07/14/2020    ECHOCARDIOGRAM REPORT   Patient Name:  Jordi Barnetta Chapel Membreno Date of Exam: 07/14/2020 Medical Rec #:  [id number]     Height: Accession #:    [id number]    Weight: Date of Birth:  04-27-1935     BSA: Patient Age:    18 years      BP:           190/71 mmHg Patient Gender: F             HR:           61 bpm. Exam Location:  Inpatient Procedure: 2D Echo, 3D Echo, Cardiac Doppler, Color Doppler and Strain Analysis Indications:    CHF-Acute Diastolic [id number]  History:        Patient has no prior history of Echocardiogram examinations.                 COPD, Arrythmias:Atrial Fibrillation; Risk Factors:Dyslipidemia                 and Former Smoker. Hypothyroidism.  Sonographer:    Darlina Sicilian RDCS Referring Phys: [id number] [place]  1. Left ventricular ejection fraction,  by estimation, is 50 to 55%. The left ventricle has low normal function. The left ventricle has no regional wall motion abnormalities. There is moderate left ventricular hypertrophy. Left ventricular diastolic parameters are consistent with Grade II diastolic dysfunction (pseudonormalization). Elevated left atrial pressure. The average left ventricular global longitudinal strain is -7.4 %. The global longitudinal strain is abnormal.  2. Right ventricular systolic function is normal. The right ventricular size is normal.  3. Left atrial size was mildly dilated.  4. Right atrial size was mildly dilated.  5. Moderate pleural effusion in the left lateral region.  6. The mitral valve is normal in structure. Mild mitral valve regurgitation. No evidence of mitral stenosis.  7. The aortic valve is tricuspid. Aortic valve regurgitation is mild. Mild to moderate aortic valve sclerosis/calcification is present, without any evidence of aortic stenosis.  8. The inferior vena cava is normal in size with greater than 50% respiratory variability, suggesting right atrial pressure of 3 mmHg. FINDINGS  Left Ventricle: Left ventricular ejection fraction, by estimation, is 50 to 55%. The left ventricle has low normal function. The left ventricle has no regional wall motion abnormalities. The average left ventricular global longitudinal strain is -7.4 %.  The global longitudinal strain is abnormal. The left ventricular internal cavity size was normal in size. There is moderate left ventricular hypertrophy. Left ventricular diastolic parameters are consistent with Grade II diastolic dysfunction (pseudonormalization). Elevated left atrial pressure. Right Ventricle: The right ventricular size is normal.Right ventricular systolic function is normal. Left Atrium: Left atrial size was mildly dilated. Right Atrium: Right atrial size was mildly dilated. Pericardium: There is no evidence of pericardial effusion. Mitral Valve: The mitral valve is  normal in structure. Mild mitral valve regurgitation. No evidence of mitral valve stenosis. Tricuspid Valve: The tricuspid valve is normal in structure. Tricuspid valve regurgitation is mild . No evidence of tricuspid stenosis. Aortic Valve: The aortic valve is tricuspid. Aortic valve regurgitation is mild. Aortic regurgitation PHT measures 488 msec. Mild to moderate aortic valve sclerosis/calcification is present, without any evidence of aortic stenosis. Pulmonic Valve: The pulmonic valve was normal in structure. Pulmonic valve regurgitation is trivial. No evidence of pulmonic stenosis. Aorta: The aortic root is normal in size and structure. Venous: The inferior vena cava is normal in size with greater than 50% respiratory variability, suggesting right atrial pressure of 3 mmHg. IAS/Shunts:  No atrial level shunt detected by color flow Doppler. Additional Comments: There is a moderate pleural effusion in the left lateral region.  LEFT VENTRICLE PLAX 2D LVIDd:         4.00 cm      Diastology LVIDs:         2.80 cm      LV e' medial:    3.08 cm/s LV PW:         1.40 cm      LV E/e' medial:  19.7 LV IVS:        1.30 cm      LV e' lateral:   2.70 cm/s LVOT diam:     2.15 cm      LV E/e' lateral: 22.5 LV SV:         68 LVOT Area:     3.63 cm     2D Longitudinal Strain                             2D Strain GLS Avg:     -7.4 %  LV Volumes (MOD) LV vol d, MOD A2C: 101.0 ml LV vol d, MOD A4C: 113.0 ml LV vol s, MOD A2C: 42.2 ml LV vol s, MOD A4C: 65.9 ml LV SV MOD A2C:     58.8 ml LV SV MOD A4C:     113.0 ml LV SV MOD BP:      59.4 ml RIGHT VENTRICLE RV S prime:     10.40 cm/s TAPSE (M-mode): 1.9 cm LEFT ATRIUM             RIGHT ATRIUM LA diam:        4.10 cm RA Area:     19.40 cm LA Vol (A2C):   58.8 ml RA Volume:   58.50 ml LA Vol (A4C):   73.7 ml LA Biplane Vol: 65.7 ml  AORTIC VALVE LVOT Vmax:   79.70 cm/s LVOT Vmean:  52.300 cm/s LVOT VTI:    0.186 m AI PHT:      488 msec  AORTA Ao Root diam: 3.20 cm Ao Asc diam:   3.10 cm MITRAL VALVE MV Area (PHT): 4.86 cm    SHUNTS MV Decel Time: 156 msec    Systemic VTI:  0.19 m MV E velocity: 60.80 cm/s  Systemic Diam: 2.15 cm MV A velocity: 41.10 cm/s MV E/A ratio:  1.48 Kirk Ruths MD Electronically signed by Kirk Ruths MD Signature Date/Time: 07/14/2020/4:36:38 PM    Final         Scheduled Meds: . amiodarone  200 mg Oral Daily  . apixaban  2.5 mg Oral BID  . diltiazem  120 mg Oral Daily  . feeding supplement  237 mL Oral TID BM  . fluticasone furoate-vilanterol  1 puff Inhalation Daily   And  . umeclidinium bromide  1 puff Inhalation Daily  . hydrALAZINE  50 mg Oral Q8H  . isosorbide mononitrate  30 mg Oral Daily  . levothyroxine  25 mcg Oral Q0600  . multivitamin with minerals  1 tablet Oral Daily  . pantoprazole  40 mg Oral Daily  . polyethylene glycol  17 g Oral Daily  . potassium chloride  40 mEq Oral BID  . rosuvastatin  10 mg Oral QHS  . torsemide  20 mg Oral q1800  . torsemide  40 mg Oral Daily   Continuous Infusions:    LOS: 3 days  Dana Allan MD [facility] 07/16/2020, 1:39 PM  If 7PM-7AM, please contact night-coverage [email or web address]

## 2020-07-16 NOTE — Progress Notes (Signed)
Pt transferred from 5N.  Pt belongings include pants, underwear, jacket, shirt, personal medications, eye glasses, and b/l hearing aids.  Upon transfer pt weaned to 3L [place] after rotating O2 probe site.  B/l hearing aid batteries changed, and pt easily aroused by voice.  Pt drowsy with limited maintained eye opening.

## 2020-07-16 NOTE — Progress Notes (Signed)
Occupational Therapy Treatment Patient Details Name: [name] MRN: [id number] DOB: Feb 01, 1936 Today's Date: 07/16/2020    History of present illness Pt is an 85 yo female s/p fall on R hand avulsion fx of middle phalanx of 4th digit, B/L pleural effusions, pulmonary edema, acute CHF, hyponatremia, and AKI.  CT head negative; PMHx:  Afib, HTN.   OT comments  [name] was sleeping upon arrival, upon waking up and throughout session pt was dry heaving, no emesis. Pt experienced incontinence in the bed. Given max verbal and tactile cues pt was unable to participate in pericare or bathing at bed level. Pt assisted in bed mobility given simple step by step commands and mod A for physical movement. Nausea notably worse with rolling. Session limited to bed level mobility and bathing due to pt inability to sequence or follow commands as well as nausea and vomiting. Pt with limited participation functionally this session. Pt would benefit from continued OT services to progress OOB activities. D/c plan remains appropriate.    Follow Up Recommendations  SNF;Supervision/Assistance - 24 hour    Equipment Recommendations  None recommended by OT       Precautions / Restrictions Precautions Precautions: Fall Restrictions Weight Bearing Restrictions: No       Mobility Bed Mobility Overal bed mobility: Needs Assistance Bed Mobility: Rolling Rolling: Mod assist (verbal and tactile cues for sequencing)         General bed mobility comments: deferred EOB and OOB this session due to nausea and vomitting and limited ability to follow comands or sequence safely this session    Transfers Overall transfer level: Needs assistance               General transfer comment: deferred this session due to nausea, vommitting and inablity to follow simple commands    Balance Overall balance assessment: Needs assistance;History of Falls                   ADL either performed or assessed with  clinical judgement   ADL   Toilet Transfer: Total assistance (bed level; pt with incontinence episode this session)   Toileting- Clothing Manipulation and Hygiene: Total assistance;Bed level (pt given max verbal and tactile cues for pericare after incontinence episode however pt unable to follow commands, total A for clean up)       Functional mobility during ADLs: Moderate assistance General ADL Comments: Pt limited by cognition,nausea and vommiting, and lethary               Cognition Arousal/Alertness: Lethargic Behavior During Therapy: Flat affect Overall Cognitive Status: Impaired/Different from baseline Area of Impairment: Safety/judgement;Problem solving;Orientation;Attention;Memory;Following commands;Awareness                 Orientation Level: Disoriented to;Place;Situation;Time   Memory: Decreased short-term memory Following Commands: Follows one step commands with increased time;Follows one step commands inconsistently Safety/Judgement: Decreased awareness of deficits;Decreased awareness of safety Awareness: Emergent Problem Solving: Slow processing;Decreased initiation;Difficulty sequencing;Requires verbal cues;Requires tactile cues General Comments: Pt sleeping upon arrival, difficult to wake and stay awake, dry heaving throughout session wtih movement, disoriented adn unable to follow commands              General Comments Pt with incontinence episide this session, required increased cueing for all bed mobility, and dry heaving throughout session    Pertinent Vitals/ Pain       Pain Assessment: Faces Faces Pain Scale: Hurts even more Pain Descriptors / Indicators: Discomfort;Grimacing;Moaning Pain Intervention(s): Limited  activity within patient's tolerance;Monitored during session         Frequency  Min 2X/week        Progress Toward Goals  OT Goals(current goals can now be found in the care plan section)  Progress towards OT goals:  Progressing toward goals  Acute Rehab OT Goals OT Goal Formulation: With patient Time For Goal Achievement: 07/28/20 Potential to Achieve Goals: Good ADL Goals Pt Will Perform Grooming: with min guard assist;standing Pt Will Perform Lower Body Dressing: with min assist;sit to/from stand;sitting/lateral leans Pt Will Transfer to Toilet: with supervision;stand pivot transfer Pt Will Perform Toileting - Clothing Manipulation and hygiene: with min guard assist;sit to/from stand Additional ADL Goal #1: Pt will perform OOB ADL with supervisionA to increase independence. Additional ADL Goal #2: Pt to increase standing tolerance x5 mins to increase activity tolerance for OOB ADL.  Plan Discharge plan remains appropriate       AM-PAC OT "6 Clicks" Daily Activity     Outcome Measure   Help from another person eating meals?: A Little Help from another person taking care of personal grooming?: A Little Help from another person toileting, which includes using toliet, bedpan, or urinal?: Total Help from another person bathing (including washing, rinsing, drying)?: A Lot Help from another person to put on and taking off regular upper body clothing?: A Little Help from another person to put on and taking off regular lower body clothing?: A Lot 6 Click Score: 14    End of Session    OT Visit Diagnosis: Unsteadiness on feet (R26.81);Muscle weakness (generalized) (M62.81)   Activity Tolerance Other (comment) (Pt limited by nausea and vomiting)   Patient Left in bed;with call bell/phone within reach;with bed alarm set   Nurse Communication Mobility status;Other (comment) (incontinent & cognition)        Time: [id number] OT Time Calculation (min): 22 min  Charges: OT General Charges $OT Visit: 1 Visit OT Treatments $Self Care/Home Management : 8-22 mins     Aymee Fomby A Melisssa Donner 07/16/2020, 3:20 PM

## 2020-07-17 ENCOUNTER — Inpatient Hospital Stay (HOSPITAL_COMMUNITY): Payer: Medicare Other

## 2020-07-17 DIAGNOSIS — I5033 Acute on chronic diastolic (congestive) heart failure: Secondary | ICD-10-CM

## 2020-07-17 DIAGNOSIS — R0602 Shortness of breath: Secondary | ICD-10-CM

## 2020-07-17 DIAGNOSIS — J9621 Acute and chronic respiratory failure with hypoxia: Secondary | ICD-10-CM

## 2020-07-17 DIAGNOSIS — W19XXXD Unspecified fall, subsequent encounter: Secondary | ICD-10-CM

## 2020-07-17 DIAGNOSIS — I480 Paroxysmal atrial fibrillation: Secondary | ICD-10-CM | POA: Diagnosis not present

## 2020-07-17 LAB — RENAL FUNCTION PANEL
Albumin: 2.6 g/dL — ABNORMAL LOW (ref 3.5–5.0)
Anion gap: 13 (ref 5–15)
BUN: 24 mg/dL — ABNORMAL HIGH (ref 8–23)
CO2: 32 mmol/L (ref 22–32)
Calcium: 9 mg/dL (ref 8.9–10.3)
Chloride: 87 mmol/L — ABNORMAL LOW (ref 98–111)
Creatinine, Ser: 1.79 mg/dL — ABNORMAL HIGH (ref 0.44–1.00)
GFR, Estimated: 28 mL/min — ABNORMAL LOW (ref >60)
Glucose, Bld: 74 mg/dL (ref 70–99)
Phosphorus: 4.2 mg/dL (ref 2.5–4.6)
Potassium: 3.5 mmol/L (ref 3.5–5.1)
Sodium: 132 mmol/L — ABNORMAL LOW (ref 135–145)

## 2020-07-17 LAB — SEDIMENTATION RATE: Sed Rate: 9 mm/hr (ref 0–22)

## 2020-07-17 LAB — BRAIN NATRIURETIC PEPTIDE: B Natriuretic Peptide: 4108.2 pg/mL — ABNORMAL HIGH (ref 0.0–100.0)

## 2020-07-17 LAB — D-DIMER, QUANTITATIVE: D-Dimer, Quant: 2.13 ug/mL-FEU — ABNORMAL HIGH (ref 0.00–0.50)

## 2020-07-17 LAB — PROCALCITONIN: Procalcitonin: 0.31 ng/mL

## 2020-07-17 LAB — GLUCOSE, CAPILLARY: Glucose-Capillary: 85 mg/dL (ref 70–99)

## 2020-07-17 MED ORDER — HYDRALAZINE HCL 20 MG/ML IJ SOLN
5.0000 mg | Freq: Four times a day (QID) | INTRAMUSCULAR | Status: DC | PRN
  Administered 2020-07-17 – 2020-07-18 (×3): 5 mg via INTRAVENOUS
  Filled 2020-07-17 (×3): qty 1

## 2020-07-17 MED ORDER — ASPIRIN 81 MG PO CHEW
81.0000 mg | CHEWABLE_TABLET | Freq: Every day | ORAL | Status: DC
  Administered 2020-07-17 – 2020-07-18 (×2): 81 mg via ORAL
  Filled 2020-07-17 (×2): qty 1

## 2020-07-17 MED ORDER — AMLODIPINE BESYLATE 5 MG PO TABS
5.0000 mg | ORAL_TABLET | Freq: Every day | ORAL | Status: DC
  Administered 2020-07-17: 5 mg via ORAL
  Filled 2020-07-17: qty 1

## 2020-07-17 MED ORDER — FUROSEMIDE 10 MG/ML IJ SOLN
20.0000 mg | Freq: Once | INTRAMUSCULAR | Status: AC
  Administered 2020-07-17: 20 mg via INTRAVENOUS
  Filled 2020-07-17: qty 2

## 2020-07-17 MED ORDER — HEPARIN SODIUM (PORCINE) 5000 UNIT/ML IJ SOLN
5000.0000 [IU] | Freq: Three times a day (TID) | INTRAMUSCULAR | Status: DC
  Administered 2020-07-18: 5000 [IU] via SUBCUTANEOUS
  Filled 2020-07-17: qty 1

## 2020-07-17 MED ORDER — FUROSEMIDE 10 MG/ML IJ SOLN
40.0000 mg | Freq: Two times a day (BID) | INTRAMUSCULAR | Status: DC
  Administered 2020-07-17 – 2020-07-18 (×3): 40 mg via INTRAVENOUS
  Filled 2020-07-17 (×3): qty 4

## 2020-07-17 NOTE — Progress Notes (Signed)
Received a call from bedside RN regarding patient having increase in O2 requirement.  Reviewed CXR completed on 07/16/20 which showed worsening pulmonary edema and bilateral pleural effusions.  BNP >4000 (07/17/20) from 2000 (07/12/2020).    Arterial blood gas revealed PH 7.523/41.4/48.9 on 40% FiO2.  Patient did not tolerate NRB therefore was placed on HFNC to maintain O2 sat >92%.  IV Lasix 20 mg x2 were ordered to be administered.  Consulted cardiology routinely, will see in the morning.

## 2020-07-17 NOTE — Progress Notes (Signed)
Paged Dr. Nevada Crane to confirm O2 goal >92%.  Pt sleeping and mouth breathing.  No discomfort or increased WOB at rest with SpO2 at 88%.  Pt refused venturi mask.  Refer to vital sign documentation for O2 requirements to achieve O2 goal.

## 2020-07-17 NOTE — Progress Notes (Signed)
PROGRESS NOTE    [name]  UTM:[id number] DOB: [birth year] DOA: 07/10/2020 PCP: Cassandria Anger, MD   Brief Narrative: [name] is a 85 y.o. female with a history of paroxysmal atrial fibrillation, more thrombus on Eliquis, pulm hypertension, hypothyroidism, hyperlipidemia, anxiety, significant COPD, chronic systolic heart failure.  Patient presents secondary to dizziness which is a recurrent issue.  Complicated by Eliquis use.  Patient was recently in the hospital for similar presentation.  On admission, patient found to have an avulsion fracture of right third digit in addition to hyponatremia.  07/16/2020: Patient seen.  Sodium has improved to 129.  Patient is both on torsemide 40 Mg daily and 20 Mg daily.  Will discontinue torsemide 40 Mg daily and Continue torsemide 20 Mg orally daily.  We will continue to assess need for diuretics daily.  We will repeat urine sodium, urine and serum osmolality, cortisol level.  Suspect patient may have component of SIADH.  We will have low threshold to start patient on sodium chloride tablets.  No significant history from patient.  07/17/2020: Patient seen alongside patient's nurse.  Cardiology input is appreciated.  Patient's oral torsemide has been discontinued.  Cardiology has started patient on IV Lasix 40 Mg twice daily.  Apparently, patient developed mild shortness of breath overnight.  Cardiology is managing patient as a case of acute on chronic diastolic CHF/pulmonary edema.  Pleural effusion is also noted on chest x-ray.  Patient was on amiodarone.  Amiodarone is currently on hold, as amiodarone toxicity is always a possibility.  ABGs noted, significant hypoxia with pH of 7.53.  We will check D-dimer, procalcitonin and chest CT without contrast.  Likely, patient has chronic kidney disease stage IV (baseline serum creatinine was not visualized).  Patient remains lethargic, will guarded prognosis.  Sodium has improved to 132.  Assessment &  Plan:   Active Problems:   Fall   Hyponatremia   AF (paroxysmal atrial fibrillation) (HCC)   Chronic systolic CHF (congestive heart failure) (HCC)   Hypothyroidism   COPD (chronic obstructive pulmonary disease) (HCC)   Anxiety   Hyperlipidemia   Malnutrition of moderate degree  Fall Non-syncopal although patient has had lightheadedness prior to falling. She has fallen multiple times in as many weeks. Patient is on multiple antihypertensives which are possibly contributing. -PT/OT recommendations: SNF  Avulsion fracture of right fourth digit In setting of fall. Patient with some pain and swelling. Discussed with orthopedic surgery who recommended splint in extension and outpatient follow-up with Dr. Caralyn Guile in 2 weeks. Use finger as tolerated. -Splint  Hyponatremia Unsure of etiology. Patient with some evidence of hypervolemia but she has had some improvement with IV fluids. Albumin also low. -Hold IV fluids; continue torsemide -BMP in AM 07/16/2020: See above documentation.  Recurrent falls may be associated with hyponatremia. 07/17/2020: Sodium is improved to 132.  Continue current management.  Left second toe swelling Concern for fracture. X-ray of foot is negative for acute fracture.  Acute on chronic respiratory failure with hypoxia Stable at 3 Lpm via [place] 07/17/2020: Check D-dimer.  Chest CT without contrast.    History of mural thrombus Paroxysmal atrial fibrillation Patient is currently on Eliquis 2.5 mg BID as an outpatient which was recommended by Cardiology in 06/2020. It appears metoprolol was discontinued on last admission discharge. -Continue Eliquis 2.5 mg BID -Continue home amiodarone and diltiazem -Hold Metoprolol 07/17/2020: Eliquis has been discontinued.  Patient is only on subcutaneous heparin for DVT prophylaxis.  Amiodarone is on hold.  ESR has been ordered.  Primary hypertension Patient is on amlodipine, diltiazem, hydralazine, Imdur and metoprolol as an  outpatient. On chart review, metoprolol appears to have been recommended for discontinuation. Possibly complicating falls if regimen is causing postural hypotension -Continue home diltiazem as mentioned above -Continue home Imdur, amlodipine, hydralazine (increase to home dose of 50 mg TID) -Hold metoprolol 07/16/2020: Continue to optimize blood pressure.  Keep systolic blood pressure around 140 mmHg.  Acute on chronic chronic systolic heart failure Last EF of 55-60% which was improved from previous EF of 35-40%. Patient is on torsemide, potassium as an outpatient. -Continue home torsemide and potassium 07/16/2020: Currently, patient has mainly diastolic dysfunction.  Will be cautious with diuretics.  Will discontinue torsemide 40 Mg p.o. once daily.  Continue torsemide 20 Mg p.o. once daily. 07/17/2020: Cardiology is directing care.  Torsemide has been discontinued.  Patient was started on IV Lasix 40 Mg twice daily.  Check D-dimer.  Right forehead hematoma Improved from one month prior but likely slow to improve secondary to Eliquis use.  Hypothyroidism TSH of 5.552 on admission. Patient is on synthroid. Although she has hyponatremia, unsure this is the cause. With history of atrial fibrillation, and the fact that TSH is down from peak of around 7.7 in January, will opt to continue current dosing. -Continue Synthroid 25 mcg   Anxiety -Continue klonopin qhs prn  Hyperlipidemia -Continue Crestor  COPD -Continue Trelegy ellipta and albuterol prn  GERD -Continue Protonix  Overall, her cardiac prognosis.  Low threshold to consult palliative care team.   DVT prophylaxis: Subcutaneous heparin 5000 units every 8 hours. Code Status:   Code Status: Full Code Family Communication: None at bedside Disposition Plan: Discharge home vs SNF pending patient decision and improvement of sodium. Anticipate discharge in 2-3 days   Consultants:   Orthopedic surgery  Procedures:    None  Antimicrobials:  None    Subjective: Worsening shortness of breath. Transfer to higher level of care. Patient remains lethargic, therefore, could not give much history.  Objective: Vitals:   07/17/20 0656 07/17/20 0706 07/17/20 0807 07/17/20 0821  BP: (!) 184/53 (!) 186/57  136/90  Pulse: 73 71    Resp:  17    Temp:  (!) 97.3 F (36.3 C)    TempSrc:  Axillary    SpO2: 95% 96% 94%   Weight:      Height:        Intake/Output Summary (Last 24 hours) at 07/17/2020 1031 Last data filed at 07/17/2020 0900 Gross per 24 hour  Intake 100 ml  Output 400 ml  Net -300 ml   Filed Weights   07/15/20 0838 07/17/20 0400  Weight: 44.8 kg 44.4 kg    Examination:  General exam: Appears calm and comfortable, but lethargic.  Respiratory: Decreased air entry.    Cardiovascular: S1 & S2, irregularly irregular.  Gastrointestinal: Obese, soft and nontender.  Organs are not palpable.   Neurologic: Patient is lethargic.  Patient moves all extremities.   Musculoskeletal: Minimal edema of the extremities.  Data Reviewed: I have personally reviewed following labs and imaging studies  CBC Lab Results  Component Value Date   WBC 11.9 (H) 07/16/2020   RBC 4.08 07/16/2020   HGB 11.2 (L) 07/16/2020   HCT 33.7 (L) 07/16/2020   MCV 82.6 07/16/2020   MCH 27.5 07/16/2020   PLT 371 07/16/2020   MCHC 33.2 07/16/2020   RDW 21.2 (H) 07/16/2020   LYMPHSABS 0.7 07/20/2020   MONOABS 1.3 (H) 07/25/2020  EOSABS 0.0 07/18/2020   BASOSABS 0.0 25/95/6387     Last metabolic panel Lab Results  Component Value Date   NA 132 (L) 07/17/2020   K 3.5 07/17/2020   CL 87 (L) 07/17/2020   CO2 32 07/17/2020   BUN 24 (H) 07/17/2020   CREATININE 1.79 (H) 07/17/2020   GLUCOSE 74 07/17/2020   GFRNONAA 28 (L) 07/17/2020   CALCIUM 9.0 07/17/2020   PHOS 4.2 07/17/2020   PROT 5.5 (L) 07/14/2020   ALBUMIN 2.6 (L) 07/17/2020   BILITOT 0.6 07/14/2020   ALKPHOS 59 07/14/2020   AST 39 07/14/2020    ALT 27 07/14/2020   ANIONGAP 13 07/17/2020    CBG (last 3)  Recent Labs    07/17/20 0456  GLUCAP 85     GFR: Estimated Creatinine Clearance: 17 mL/min (A) (by C-G formula based on SCr of 1.79 mg/dL (H)).  Coagulation Profile: No results for input(s): INR, PROTIME in the last 168 hours.  Recent Results (from the past 240 hour(s))  Resp Panel by RT-PCR (Flu A&B, Covid) Nasopharyngeal Swab     Status: None   Collection Time: 07/28/2020  5:51 PM   Specimen: Nasopharyngeal Swab; Nasopharyngeal(NP) swabs in vial transport medium  Result Value Ref Range Status   SARS Coronavirus 2 by RT PCR NEGATIVE NEGATIVE Final    Comment: (NOTE) SARS-CoV-2 target nucleic acids are NOT DETECTED.  The SARS-CoV-2 RNA is generally detectable in upper respiratory specimens during the acute phase of infection. The lowest concentration of SARS-CoV-2 viral copies this assay can detect is 138 copies/mL. A negative result does not preclude SARS-Cov-2 infection and should not be used as the sole basis for treatment or other patient management decisions. A negative result may occur with  improper specimen collection/handling, submission of specimen other than nasopharyngeal swab, presence of viral mutation(s) within the areas targeted by this assay, and inadequate number of viral copies(<138 copies/mL). A negative result must be combined with clinical observations, patient history, and epidemiological information. The expected result is Negative.  Fact Sheet for Patients:  EntrepreneurPulse.com.au  Fact Sheet for Healthcare Providers:  IncredibleEmployment.be  This test is no t yet approved or cleared by the [place] FDA and  has been authorized for detection and/or diagnosis of SARS-CoV-2 by FDA under an Emergency Use Authorization (EUA). This EUA will remain  in effect (meaning this test can be used) for the duration of the COVID-19 declaration under  Section 564(b)(1) of the Act, 21 U.S.C.section 360bbb-3(b)(1), unless the authorization is terminated  or revoked sooner.       Influenza A by PCR NEGATIVE NEGATIVE Final   Influenza B by PCR NEGATIVE NEGATIVE Final    Comment: (NOTE) The Xpert Xpress SARS-CoV-2/FLU/RSV plus assay is intended as an aid in the diagnosis of influenza from Nasopharyngeal swab specimens and should not be used as a sole basis for treatment. Nasal washings and aspirates are unacceptable for Xpert Xpress SARS-CoV-2/FLU/RSV testing.  Fact Sheet for Patients: EntrepreneurPulse.com.au  Fact Sheet for Healthcare Providers: IncredibleEmployment.be  This test is not yet approved or cleared by the [place] FDA and has been authorized for detection and/or diagnosis of SARS-CoV-2 by FDA under an Emergency Use Authorization (EUA). This EUA will remain in effect (meaning this test can be used) for the duration of the COVID-19 declaration under Section 564(b)(1) of the Act, 21 U.S.C. section 360bbb-3(b)(1), unless the authorization is terminated or revoked.  Performed at [facility], [place] [address], [place], [place] [address]  Radiology Studies: CT HEAD WO CONTRAST  Result Date: 07/15/2020 CLINICAL DATA:  Mental status changes of unknown cause.  Falling. EXAM: CT HEAD WITHOUT CONTRAST TECHNIQUE: Contiguous axial images were obtained from the base of the skull through the vertex without intravenous contrast. COMPARISON:  07/14/2020 FINDINGS: Brain: Age related atrophy. No focal infarction. No mass, hemorrhage, hydrocephalus or extra-axial collection. Vascular: There is atherosclerotic calcification of the major vessels at the base of the brain. Skull: No skull fracture. Sinuses/Orbits: Clear/normal Other: Right frontal scalp hematoma. IMPRESSION: Right frontal scalp hematoma. No underlying skull fracture. No intracranial traumatic finding. Age related atrophy.  Electronically Signed   By: Nelson Chimes M.D.   On: 07/15/2020 17:22   DG CHEST PORT 1 VIEW  Result Date: 07/16/2020 CLINICAL DATA:  Shortness of breath. EXAM: PORTABLE CHEST 1 VIEW COMPARISON:  Most recent radiograph 08/03/2020 FINDINGS: Lordotic positioning. Volume loss with bilateral pleural effusions and likely atelectasis. Heart size is obscured. Dense aortic atherosclerosis. Increased interstitial thickening from prior exam likely pulmonary edema. Biapical pleuroparenchymal scarring. No pneumothorax. The bones are under mineralized. IMPRESSION: Increased pulmonary edema from prior. Volume loss with bilateral pleural effusions and likely atelectasis. Electronically Signed   By: Keith Rake M.D.   On: 07/16/2020 22:52        Scheduled Meds: . amLODipine  5 mg Oral Daily  . aspirin  81 mg Oral Daily  . feeding supplement  237 mL Oral TID BM  . fluticasone furoate-vilanterol  1 puff Inhalation Daily   And  . umeclidinium bromide  1 puff Inhalation Daily  . furosemide  40 mg Intravenous BID  . [START ON 07/18/2020] heparin injection (subcutaneous)  5,000 Units Subcutaneous Q8H  . hydrALAZINE  50 mg Oral Q8H  . isosorbide mononitrate  30 mg Oral Daily  . levothyroxine  25 mcg Oral Q0600  . multivitamin with minerals  1 tablet Oral Daily  . pantoprazole  40 mg Oral Daily  . polyethylene glycol  17 g Oral Daily  . potassium chloride  40 mEq Oral BID  . rosuvastatin  10 mg Oral QHS   Continuous Infusions:    LOS: 4 days     Dana Allan MD [facility] 07/17/2020, 10:31 AM  If 7PM-7AM, please contact night-coverage [email or web address]

## 2020-07-17 NOTE — Progress Notes (Signed)
DR [place] Crane ordered to do I n and out straight cath

## 2020-07-17 NOTE — Progress Notes (Signed)
Patient bladder scan volume 367. MD notified order to continue to monitor and no interventions at this time. Will continue to monitor.

## 2020-07-17 NOTE — Consult Note (Addendum)
Cardiology Consultation:   Patient ID: [name] MRN: [id number]; DOB: [birth year]  Admit date: 07/30/2020 Date of Consult: 07/17/2020  PCP:  Cassandria Anger, MD   [facility] Providers Cardiologist:  Kirk Ruths, MD   {      Patient Profile:   [name] is a 85 y.o. female with a hx of PAF, CAD, hypertension, hyperlipidemia, COPD with emphysema, GERD who is being seen 07/17/2020 for the evaluation of dyspnea and possible acute diastolic CHF at the request of Dr. Nevada Crane.  History of Present Illness:   Ms. Sylvan is a frail tiny 85 year old female with past medical history of PAF, CAD, hypertension, hyperlipidemia, COPD with emphysema, GERD.  She had a lateral MI in June 2021, cardiac catheterization at the time revealed occluded D1 with 30% mid LAD, she underwent balloon angioplasty of diagonal vessel.  Patient was readmitted with hypertensive urgency and acute heart failure in July 2021.  She underwent diuresis however creatinine significantly jumped, Lasix was discontinued.  Echocardiogram in July 2021 showed EF 50 to 55%, mild LAE, mild AI.  She previously noted to have atrial fibrillation on heart monitor, however refused [place].  She had recurrent atrial fibrillation in December 2021 and signs of volume overload.  She finally agreed to start on low dose Eliquis.  Echocardiogram obtained in January 2022 showed EF went down to 35 to 40%, mild LVH, moderately reduced RVEF, RVSP 69.2 mmHg.  TEE was planned, however she was found to have left atrial appendage thrombus, therefore DC cardioversion was aborted.  I last saw the patient in February 2022, she was still very short of breath, I discussed with Dr. Stanford Breed and we started her on amiodarone therapy with plan for subsequent TEE DCCV.  Prior to the visit, her torsemide was increased to 40 mg twice a day at one point. She underwent successful TEE cardioversion on 05/11/2020.   She was seen back by Dr. Stanford Breed for post cardioversion  follow-up on 06/22/2020, she was in severe sinus bradycardia at the time with heart rate of 39 bpm.  Diltiazem was stopped, metoprolol tartrate reduced down to 25 mg twice a day.  Due to worsening renal function, her torsemide was reduced down to 20 mg twice a day.  Following the visit, she made several calls to after our answering service complaining of elevated blood pressure.  She went to the emergency room on 06/23/2020 due to elevated blood pressure, potassium was low at the time.  She was also bradycardic in the 40s.  Dr. Percival Spanish who saw the patient in the emergency room noted she was still taking the 40 mg twice a day of torsemide, the dose was never reduced.  He recommended reduced to torsemide to 40 mg a.m. and 20 mg p.m.  30 mg daily of Imdur and 50 mg 3 times daily of hydralazine was added to her medical regimen to help control her blood pressure.  At the request of the patient, she was released from the emergency room.  Unfortunately, patient was readmitted in late April 2022 after suffering a unwitnessed fall that resulted in laceration and the hematoma on her head.  Eliquis was held for 3 days due to hematoma.  She was seen by Dr. Marlou Porch in the hospital who recommended a 14-day heart monitor.  Hydralazine was reduced to 10 mg 3 times daily dosing to allow higher blood pressure.  Metoprolol was discontinued at that time.  All of her AV nodal blocking agent was discontinued by this  point, she was left on low-dose amiodarone therapy.  Discharge diuretic dosing was 40 mg a.m. and 20 mg p.m. of torsemide.  Patient returned back to the hospital on 07/25/2020 due to worsening generalized weakness, intermittent dizziness and recurrent falls.  She reportedly has fallen at least twice in the past 2 weeks.  She has very poor oral intake prior to hospital admission.  Work-up in the emergency room also revealed avulsion fracture of the dorsal base of mid phalanx of fourth digit on the right hand.  Work-up revealed  pulmonary edema.  Electrolyte was significant for hyponatremia with sodium level 123.  Initial creatinine was 1.62.  Low sodium level was felt to be related to poor oral intake and a degree of SIADH.  Repeat echocardiogram obtained on 07/14/2020 showed EF 50 to 55%, grade 2 DD, mild MR, mild AI.  Torsemide was further reduced down to 20 mg daily.  Patient developed increased oxygen demand in the morning of 07/17/2020.  Chest x-ray from the day prior revealed worsening pulmonary edema and bilateral pleural effusion.  BNP was greater than 4000 which has doubled when compared to the admission BNP.  Patient was given 2 doses of IV Lasix 20 mg this morning.  Cardiology service has been consulted for acute diastolic heart failure.  Majority of her history was obtained from chart review as patient is very sleepy this morning and was only able to respond to questions intermittently in very short sentences. Also she has another chart, 2 charts are being merged.     Past Medical History:  Diagnosis Date  . Atrial fibrillation ([place])     History reviewed. No pertinent surgical history.   Home Medications:  Prior to Admission medications   Medication Sig Start Date End Date Taking? Authorizing Provider  albuterol (PROVENTIL) (2.5 MG/3ML) 0.083% nebulizer solution Inhale 3 mLs into the lungs every 6 (six) hours as needed for shortness of breath or wheezing. 05/28/20  Yes [provider]  amiodarone (PACERONE) 200 MG tablet Take 200 mg by mouth daily. 07/05/20  Yes [provider]  amLODipine (NORVASC) 10 MG tablet Take 10 mg by mouth daily. 03/02/20  Yes [provider]  clonazePAM (KLONOPIN) 0.5 MG tablet Take 0.5 mg by mouth at bedtime as needed for anxiety or sleep. 04/10/20  Yes [provider]  diltiazem (CARDIZEM CD) 120 MG 24 hr capsule Take 120 mg by mouth daily. 05/17/20  Yes [provider]  ELIQUIS 2.5 MG TABS tablet Take 2.5 mg by mouth 2 (two) times daily.  06/26/20  Yes [provider]  Fluticasone-Umeclidin-Vilant (TRELEGY ELLIPTA) 100-62.5-25 MCG/INH AEPB Inhale 1 puff into the lungs daily.   Yes [provider]  hydrALAZINE (APRESOLINE) 50 MG tablet Take 50 mg by mouth 3 (three) times daily. 06/25/20  Yes [provider]  isosorbide mononitrate (IMDUR) 30 MG 24 hr tablet Take 30 mg by mouth daily. 06/25/20  Yes [provider]  levothyroxine (SYNTHROID) 25 MCG tablet Take 25 mcg by mouth every morning. 04/26/20  Yes [provider]  metoprolol tartrate (LOPRESSOR) 25 MG tablet Take 25 mg by mouth 2 (two) times daily. 06/22/20  Yes [provider]  nitroGLYCERIN (NITROSTAT) 0.4 MG SL tablet Place 0.4 mg under the tongue every 5 (five) minutes as needed for chest pain.   Yes [provider]  pantoprazole (PROTONIX) 40 MG tablet Take 40 mg by mouth daily. 05/17/20  Yes [provider]  polyethylene glycol (MIRALAX / GLYCOLAX) 17 g packet  Take 17 g by mouth daily as needed for constipation. 06/28/20  Yes [provider]  potassium chloride 20 MEQ/15ML (10%) SOLN Take 20 mEq by mouth 2 (two) times daily. 06/25/20  Yes [provider]  rosuvastatin (CRESTOR) 10 MG tablet Take 10 mg by mouth at bedtime. 07/03/20  Yes [provider]  torsemide (DEMADEX) 20 MG tablet Take 20-40 mg by mouth See admin instructions. Take 2 tablets by mouth in the morning, and 1 tablet by mouth in the evening 06/25/20  Yes [provider]  XOPENEX HFA 45 MCG/ACT inhaler Inhale 2 puffs into the lungs every 4 (four) hours as needed for shortness of breath or wheezing. 06/02/20  Yes [provider]    Inpatient Medications: Scheduled Meds: . apixaban  2.5 mg Oral BID  . diltiazem  120 mg Oral Daily  . feeding supplement  237 mL Oral TID BM  . fluticasone furoate-vilanterol  1 puff Inhalation Daily   And  . umeclidinium bromide  1 puff Inhalation Daily  . furosemide  40  mg Intravenous BID  . hydrALAZINE  50 mg Oral Q8H  . isosorbide mononitrate  30 mg Oral Daily  . levothyroxine  25 mcg Oral Q0600  . multivitamin with minerals  1 tablet Oral Daily  . pantoprazole  40 mg Oral Daily  . polyethylene glycol  17 g Oral Daily  . potassium chloride  40 mEq Oral BID  . rosuvastatin  10 mg Oral QHS   Continuous Infusions:  PRN Meds: acetaminophen, albuterol, clonazePAM, hydrALAZINE, melatonin, phenol, prochlorperazine  Allergies:    Allergies  Allergen Reactions  . Amoxicillin Other (See Comments)    Makes go crazy  . Codeine Nausea And Vomiting  . Levaquin [Levofloxacin] Nausea And Vomiting  . Other Nausea And Vomiting    Any pain medications    Social History:   Social History   Socioeconomic History  . Marital status: Divorced    Spouse name: Not on file  . Number of children: Not on file  . Years of education: Not on file  . Highest education level: Not on file  Occupational History  . Not on file  Tobacco Use  . Smoking status: Never Smoker  . Smokeless tobacco: Never Used  Substance and Sexual Activity  . Alcohol use: Not on file  . Drug use: Not on file  . Sexual activity: Not on file  Other Topics Concern  . Not on file  Social History Narrative  . Not on file   Social Determinants of Health   Financial Resource Strain: Not on file  Food Insecurity: Not on file  Transportation Needs: Not on file  Physical Activity: Not on file  Stress: Not on file  Social Connections: Not on file  Intimate Partner Violence: Not on file    Family History:   History reviewed. No pertinent family history.   ROS:  Please see the history of present illness.   All other ROS reviewed and negative.     Physical Exam/Data:   Vitals:   07/17/20 0656 07/17/20 0706 07/17/20 0807 07/17/20 0821  BP: (!) 184/53 (!) 186/57  136/90  Pulse: 73 71    Resp:  17    Temp:  (!) 97.3 F (36.3 C)    TempSrc:  Axillary    SpO2: 95% 96% 94%    Weight:      Height:        Intake/Output Summary (Last 24 hours) at 07/17/2020 0943 Last data  filed at 07/17/2020 0900 Gross per 24 hour  Intake 100 ml  Output 400 ml  Net -300 ml   Last 3 Weights 07/17/2020 07/15/2020  Weight (lbs) 97 lb 14.2 oz 98 lb 12.3 oz  Weight (kg) 44.4 kg 44.8 kg     Body mass index is 16.8 kg/m.  General: Frail, cachectic, very sleepy and did not respond to majority of the questions. HEENT: normal Lymph: no adenopathy Neck: no JVD Endocrine:  No thryomegaly Vascular: No carotid bruits; FA pulses 2+ bilaterally without bruits  Cardiac:  normal S1, S2; RRR; no murmur  Lungs: Diminished breath sound bilaterally Abd: soft, nontender, no hepatomegaly  Ext: no edema Musculoskeletal:  No deformities, BUE and BLE strength normal and equal Skin: warm and dry  Neuro:  CNs 2-12 intact, no focal abnormalities noted Psych:  Normal affect   EKG:  The EKG was personally reviewed and demonstrates:  Sinus rhythm with first-degree AV block, no obvious significant ST-T wave changes Telemetry:  Telemetry was personally reviewed and demonstrates: Sinus rhythm, no recurrent atrial fibrillation.  Relevant CV Studies:  Echo 07/14/2020 1. Left ventricular ejection fraction, by estimation, is 50 to 55%. The  left ventricle has low normal function. The left ventricle has no regional  wall motion abnormalities. There is moderate left ventricular hypertrophy.  Left ventricular diastolic  parameters are consistent with Grade II diastolic dysfunction  (pseudonormalization). Elevated left atrial pressure. The average left  ventricular global longitudinal strain is -7.4 %. The global longitudinal  strain is abnormal.  2. Right ventricular systolic function is normal. The right ventricular  size is normal.  3. Left atrial size was mildly dilated.  4. Right atrial size was mildly dilated.  5. Moderate pleural effusion in the left lateral region.  6. The mitral valve is  normal in structure. Mild mitral valve  regurgitation. No evidence of mitral stenosis.  7. The aortic valve is tricuspid. Aortic valve regurgitation is mild.  Mild to moderate aortic valve sclerosis/calcification is present, without  any evidence of aortic stenosis.  8. The inferior vena cava is normal in size with greater than 50%  respiratory variability, suggesting right atrial pressure of 3 mmHg.   Laboratory Data:  High Sensitivity Troponin:  No results for input(s): TROPONINIHS in the last 720 hours.   Chemistry Recent Labs  Lab 07/15/20 1648 07/16/20 0259 07/17/20 0211  NA 122* 129* 132*  K 3.9 4.1 3.5  CL 84* 87* 87*  CO2 27 31 32  GLUCOSE 295* 118* 74  BUN 23 24* 24*  CREATININE 1.87* 1.70* 1.79*  CALCIUM 8.5* 9.3 9.0  GFRNONAA 27* 30* 28*  ANIONGAP '11 11 13    ' Recent Labs  Lab 07/12/2020 1749 07/14/20 0245 07/16/20 1142 07/17/20 0211  PROT 6.2* 5.5*  --   --   ALBUMIN 2.8* 2.4* 2.7* 2.6*  AST 49* 39  --   --   ALT 27 27  --   --   ALKPHOS 69 59  --   --   BILITOT 0.8 0.6  --   --    Hematology Recent Labs  Lab 07/14/20 0245 07/15/20 1648 07/16/20 0259  WBC 9.1 10.3 11.9*  RBC 3.62* 4.09 4.08  HGB 10.0* 11.2* 11.2*  HCT 30.2* 34.2* 33.7*  MCV 83.4 83.6 82.6  MCH 27.6 27.4 27.5  MCHC 33.1 32.7 33.2  RDW 21.4* 21.4* 21.2*  PLT 303 362 371   BNP Recent Labs  Lab 07/16/2020 2308 07/17/20 0211  BNP 2,033.7*  4,108.2*    DDimer No results for input(s): DDIMER in the last 168 hours.   Radiology/Studies:  DG Chest 2 View  Result Date: 07/23/2020 CLINICAL DATA:  Unwitnessed fall. EXAM: CHEST - 2 VIEW COMPARISON:  06/26/2020 FINDINGS: The cardio pericardial silhouette is enlarged. Diffuse interstitial opacity is associated with bibasilar collapse/consolidation and small bilateral pleural effusions. The visualized bony structures of the thorax show no acute abnormality. Telemetry leads overlie the chest. IMPRESSION: Findings suggest edema with  bibasilar collapse/consolidation and small bilateral pleural effusions. Electronically Signed   By: Misty Stanley M.D.   On: 07/27/2020 20:37   CT HEAD WO CONTRAST  Result Date: 07/15/2020 CLINICAL DATA:  Mental status changes of unknown cause.  Falling. EXAM: CT HEAD WITHOUT CONTRAST TECHNIQUE: Contiguous axial images were obtained from the base of the skull through the vertex without intravenous contrast. COMPARISON:  08/02/2020 FINDINGS: Brain: Age related atrophy. No focal infarction. No mass, hemorrhage, hydrocephalus or extra-axial collection. Vascular: There is atherosclerotic calcification of the major vessels at the base of the brain. Skull: No skull fracture. Sinuses/Orbits: Clear/normal Other: Right frontal scalp hematoma. IMPRESSION: Right frontal scalp hematoma. No underlying skull fracture. No intracranial traumatic finding. Age related atrophy. Electronically Signed   By: Nelson Chimes M.D.   On: 07/15/2020 17:22   CT Head Wo Contrast  Result Date: 07/09/2020 CLINICAL DATA:  Recent fall with headaches, initial encounter EXAM: CT HEAD WITHOUT CONTRAST TECHNIQUE: Contiguous axial images were obtained from the base of the skull through the vertex without intravenous contrast. COMPARISON:  06/26/2020 FINDINGS: Brain: No evidence of acute infarction, hemorrhage, hydrocephalus, extra-axial collection or mass lesion/mass effect. Vascular: No hyperdense vessel or unexpected calcification. Skull: Normal. Negative for fracture or focal lesion. Sinuses/Orbits: No acute finding. Other: Right frontal scalp hematoma is noted improved when compared with the prior exam. IMPRESSION: Improved right frontal scalp hematoma. No acute intracranial abnormality noted. Electronically Signed   By: Inez Catalina M.D.   On: 08/03/2020 18:41   DG CHEST PORT 1 VIEW  Result Date: 07/16/2020 CLINICAL DATA:  Shortness of breath. EXAM: PORTABLE CHEST 1 VIEW COMPARISON:  Most recent radiograph 08/02/2020 FINDINGS: Lordotic  positioning. Volume loss with bilateral pleural effusions and likely atelectasis. Heart size is obscured. Dense aortic atherosclerosis. Increased interstitial thickening from prior exam likely pulmonary edema. Biapical pleuroparenchymal scarring. No pneumothorax. The bones are under mineralized. IMPRESSION: Increased pulmonary edema from prior. Volume loss with bilateral pleural effusions and likely atelectasis. Electronically Signed   By: Keith Rake M.D.   On: 07/16/2020 22:52   DG Hand Complete Right  Result Date: 07/05/2020 CLINICAL DATA:  85 year old female with trauma. EXAM: RIGHT HAND - COMPLETE 3+ VIEW COMPARISON:  None. FINDINGS: There is avulsion fracture of the dorsal base of the middle phalanx of the fourth digit. No other acute fracture. There is advanced osteopenia. Degenerative changes of the interphalangeal joints with distal interphalangeal fusion of the fourth and fifth digits. There is mild soft tissue swelling of the fourth digit. No radiopaque foreign object or soft tissue gas. IMPRESSION: Avulsion fracture of the dorsal base of the middle phalanx of the fourth digit. Electronically Signed   By: Anner Crete M.D.   On: 07/07/2020 20:42   DG Foot Complete Left  Result Date: 07/14/2020 CLINICAL DATA:  Left foot pain EXAM: LEFT FOOT - COMPLETE 3+ VIEW COMPARISON:  None. FINDINGS: Postsurgical changes are noted in the head of the second metatarsal. No acute fracture or dislocation is noted. No soft tissue abnormality is  seen. Calcaneal spurring is noted. IMPRESSION: Postsurgical change without acute abnormality. Electronically Signed   By: Inez Catalina M.D.   On: 07/14/2020 19:59   ECHOCARDIOGRAM COMPLETE  Result Date: 07/14/2020    ECHOCARDIOGRAM REPORT   Patient Name:   COLE KLUGH Date of Exam: 07/14/2020 Medical Rec #:  [id number]     Height: Accession #:    [id number]    Weight: Date of Birth:  1936-01-29     BSA: Patient Age:    73 years      BP:           190/71 mmHg  Patient Gender: F             HR:           61 bpm. Exam Location:  Inpatient Procedure: 2D Echo, 3D Echo, Cardiac Doppler, Color Doppler and Strain Analysis Indications:    CHF-Acute Diastolic V40.08  History:        Patient has no prior history of Echocardiogram examinations.                 COPD, Arrythmias:Atrial Fibrillation; Risk Factors:Dyslipidemia                 and Former Smoker. Hypothyroidism.  Sonographer:    Darlina Sicilian RDCS Referring Phys: [id number] B[place]andas  1. Left ventricular ejection fraction, by estimation, is 50 to 55%. The left ventricle has low normal function. The left ventricle has no regional wall motion abnormalities. There is moderate left ventricular hypertrophy. Left ventricular diastolic parameters are consistent with Grade II diastolic dysfunction (pseudonormalization). Elevated left atrial pressure. The average left ventricular global longitudinal strain is -7.4 %. The global longitudinal strain is abnormal.  2. Right ventricular systolic function is normal. The right ventricular size is normal.  3. Left atrial size was mildly dilated.  4. Right atrial size was mildly dilated.  5. Moderate pleural effusion in the left lateral region.  6. The mitral valve is normal in structure. Mild mitral valve regurgitation. No evidence of mitral stenosis.  7. The aortic valve is tricuspid. Aortic valve regurgitation is mild. Mild to moderate aortic valve sclerosis/calcification is present, without any evidence of aortic stenosis.  8. The inferior vena cava is normal in size with greater than 50% respiratory variability, suggesting right atrial pressure of 3 mmHg. FINDINGS  Left Ventricle: Left ventricular ejection fraction, by estimation, is 50 to 55%. The left ventricle has low normal function. The left ventricle has no regional wall motion abnormalities. The average left ventricular global longitudinal strain is -7.4 %.  The global longitudinal strain is abnormal. The left  ventricular internal cavity size was normal in size. There is moderate left ventricular hypertrophy. Left ventricular diastolic parameters are consistent with Grade II diastolic dysfunction (pseudonormalization). Elevated left atrial pressure. Right Ventricle: The right ventricular size is normal.Right ventricular systolic function is normal. Left Atrium: Left atrial size was mildly dilated. Right Atrium: Right atrial size was mildly dilated. Pericardium: There is no evidence of pericardial effusion. Mitral Valve: The mitral valve is normal in structure. Mild mitral valve regurgitation. No evidence of mitral valve stenosis. Tricuspid Valve: The tricuspid valve is normal in structure. Tricuspid valve regurgitation is mild . No evidence of tricuspid stenosis. Aortic Valve: The aortic valve is tricuspid. Aortic valve regurgitation is mild. Aortic regurgitation PHT measures 488 msec. Mild to moderate aortic valve sclerosis/calcification is present, without any evidence of aortic stenosis. Pulmonic Valve: The pulmonic valve was normal in  structure. Pulmonic valve regurgitation is trivial. No evidence of pulmonic stenosis. Aorta: The aortic root is normal in size and structure. Venous: The inferior vena cava is normal in size with greater than 50% respiratory variability, suggesting right atrial pressure of 3 mmHg. IAS/Shunts: No atrial level shunt detected by color flow Doppler. Additional Comments: There is a moderate pleural effusion in the left lateral region.  LEFT VENTRICLE PLAX 2D LVIDd:         4.00 cm      Diastology LVIDs:         2.80 cm      LV e' medial:    3.08 cm/s LV PW:         1.40 cm      LV E/e' medial:  19.7 LV IVS:        1.30 cm      LV e' lateral:   2.70 cm/s LVOT diam:     2.15 cm      LV E/e' lateral: 22.5 LV SV:         68 LVOT Area:     3.63 cm     2D Longitudinal Strain                             2D Strain GLS Avg:     -7.4 %  LV Volumes (MOD) LV vol d, MOD A2C: 101.0 ml LV vol d, MOD A4C:  113.0 ml LV vol s, MOD A2C: 42.2 ml LV vol s, MOD A4C: 65.9 ml LV SV MOD A2C:     58.8 ml LV SV MOD A4C:     113.0 ml LV SV MOD BP:      59.4 ml RIGHT VENTRICLE RV S prime:     10.40 cm/s TAPSE (M-mode): 1.9 cm LEFT ATRIUM             RIGHT ATRIUM LA diam:        4.10 cm RA Area:     19.40 cm LA Vol (A2C):   58.8 ml RA Volume:   58.50 ml LA Vol (A4C):   73.7 ml LA Biplane Vol: 65.7 ml  AORTIC VALVE LVOT Vmax:   79.70 cm/s LVOT Vmean:  52.300 cm/s LVOT VTI:    0.186 m AI PHT:      488 msec  AORTA Ao Root diam: 3.20 cm Ao Asc diam:  3.10 cm MITRAL VALVE MV Area (PHT): 4.86 cm    SHUNTS MV Decel Time: 156 msec    Systemic VTI:  0.19 m MV E velocity: 60.80 cm/s  Systemic Diam: 2.15 cm MV A velocity: 41.10 cm/s MV E/A ratio:  1.48 Kirk Ruths MD Electronically signed by Kirk Ruths MD Signature Date/Time: 07/14/2020/4:36:38 PM    Final      Assessment and Plan:   1. Acute on chronic diastolic heart failure  -Prior to admission, she was on 40 mg a.m. and 20 mg p.m. of torsemide.  Admission chest x-ray showed pulmonary edema.  It appears patient was initially hydrated with IV fluid, this was quickly weaned off.  Her I/O has since reached a balance.  -Chest x-ray however shows significantly worsening pulmonary edema in the past 4 days.  Would recommend to stop torsemide.  Started on 40 mg twice daily of IV Lasix for diuresis.  Complicating problem is she has very poor oral intake prior to admission, she is extremely sleepy and has not been eating much in  the past 24 hours according to her nurse.  It will be very difficult to find a balance of diuretic for this patient.  2. Frequent fall:   -She has always been very frail, weight is only 44.4 kg.  However recently, she has been having worsening weakness.  Question if this is related to poor oral intake and significant hyponatremia.  She felt a month ago on Eliquis, that resulted in a large hematoma on her forehead.  CT of the head was repeated on 5/12  during this admission, discontinued that showed right frontal scalp hematoma with no intracranial traumatic finding.  -May need to consider the benefit of Eliquis versus the risk again.  3. Hyponatremia: Likely related to poor oral intake, but may have a degree of SIADH as well.  Sodium on arrival was 123, it has since improved to 132 on today's lab work.  4. PAF: On amiodarone. Given concern for possible amiodarone toxicity, amiodarone will be discontinued. Pending ESR to see if pulmonary toxicity could be causing her breathing issue and CXR changes. Maintaining sinus rhythm during this admission. Low dose diltiazem was added back during this admission.   5. CAD: last PCI in 2021, denies any CP.   6. Hypertension: BP elevated. On hydralazine and imdur.   7. Hyperlipidemia  8. COPD   Risk Assessment/Risk Scores:        New York Heart Association (NYHA) Functional Class NYHA Class IV  CHA2DS2-VASc Score = 6  This indicates a 9.7% annual risk of stroke. The patient's score is based upon: CHF History: Yes HTN History: Yes Diabetes History: No Stroke History: No Vascular Disease History: Yes Age Score: 2 Gender Score: 1    For questions or updates, please contact [place] Please consult [email or web address] for contact info under    Signed, Almyra Deforest, [place]  07/17/2020 9:43 AM As above, patient seen and examined.  Briefly she is an 85 year old female with past medical history of paroxysmal atrial fibrillation, coronary artery disease, hypertension, hyperlipidemia, COPD for evaluation of acute on chronic diastolic congestive heart failure.  Patient admitted on May 10 with complaints of generalized weakness, falls and intermittent dizziness.  She was noted to be hyponatremic felt to be secondary to poor p.o. intake.  Demadex was decreased and she developed worsening pulmonary edema.  Cardiology now asked to evaluate.  Patient is somewhat lethargic at time of evaluation.  She does  describe some dyspnea but denies chest pain.  Chest x-ray shows bilateral pleural effusions and pulmonary edema.  Most recent sodium 132, BUN 24, creatinine 1.79, BNP 4108.  Follow-up echocardiogram showed ejection fraction 50 to 95%, grade 2 diastolic dysfunction, mild biatrial enlargement, left pleural effusion, mild mitral regurgitation, mild aortic insufficiency.  Electrocardiogram personally reviewed and showed sinus rhythm, first-degree AV block and possible prior lateral infarct.  1 acute on chronic diastolic congestive heart failure-she has significant edema and pleural effusions on chest x-ray.  Will resume Lasix 40 mg IV twice daily and follow renal function closely.  I think this is unlikely to be related to amiodarone toxicity but will hold amiodarone for now, check sed rate.  Will resume amiodarone if above improves with diuresis.  2 paroxysmal atrial fibrillation-patient remains in sinus rhythm.  We will temporarily hold amiodarone as outlined above and resume if she improves with diuresis.  I now think she is a poor candidate for anticoagulation as she has had frequent falls.  We will discontinue apixaban with the understanding her risk of CVA is  higher.  3 hyponatremia-improved since admission.  We will follow closely.  4 hypertension-given history of bradycardia will not resume Cardizem.  Instead we will treat with amlodipine 5 mg daily and continue hydralazine/nitrates.  Follow blood pressure and adjust medications as needed.  5 coronary artery disease-continue statin.  Add aspirin 81 mg daily.  Kirk Ruths, MD

## 2020-07-17 NOTE — Progress Notes (Signed)
RT placed pt on HHFNC at this time per MD's order to keep severe COPD patient 92% or greater. Pt at 20L and 100% to maintain 92%

## 2020-07-17 NOTE — Progress Notes (Signed)
Secured message sent to Dr Nevada Crane, latest bladder scan was 487m, pt received furosemide at 1724H o , 40 mg IV, asked if she wanted to do in and out straight cath or to insert foley catheter

## 2020-07-18 ENCOUNTER — Inpatient Hospital Stay (HOSPITAL_COMMUNITY): Payer: Medicare Other

## 2020-07-18 DIAGNOSIS — Z7189 Other specified counseling: Secondary | ICD-10-CM

## 2020-07-18 DIAGNOSIS — R0689 Other abnormalities of breathing: Secondary | ICD-10-CM

## 2020-07-18 DIAGNOSIS — Z515 Encounter for palliative care: Secondary | ICD-10-CM

## 2020-07-18 DIAGNOSIS — I5033 Acute on chronic diastolic (congestive) heart failure: Secondary | ICD-10-CM | POA: Diagnosis not present

## 2020-07-18 DIAGNOSIS — Z66 Do not resuscitate: Secondary | ICD-10-CM | POA: Diagnosis not present

## 2020-07-18 LAB — RENAL FUNCTION PANEL
Albumin: 2.6 g/dL — ABNORMAL LOW (ref 3.5–5.0)
Anion gap: 10 (ref 5–15)
BUN: 31 mg/dL — ABNORMAL HIGH (ref 8–23)
CO2: 32 mmol/L (ref 22–32)
Calcium: 8.8 mg/dL — ABNORMAL LOW (ref 8.9–10.3)
Chloride: 91 mmol/L — ABNORMAL LOW (ref 98–111)
Creatinine, Ser: 1.74 mg/dL — ABNORMAL HIGH (ref 0.44–1.00)
GFR, Estimated: 29 mL/min — ABNORMAL LOW (ref >60)
Glucose, Bld: 107 mg/dL — ABNORMAL HIGH (ref 70–99)
Phosphorus: 4.4 mg/dL (ref 2.5–4.6)
Potassium: 4.1 mmol/L (ref 3.5–5.1)
Sodium: 133 mmol/L — ABNORMAL LOW (ref 135–145)

## 2020-07-18 MED ORDER — HYDRALAZINE HCL 20 MG/ML IJ SOLN
10.0000 mg | Freq: Once | INTRAMUSCULAR | Status: AC
  Administered 2020-07-18: 10 mg via INTRAVENOUS
  Filled 2020-07-18: qty 1

## 2020-07-18 MED ORDER — HYDRALAZINE HCL 50 MG PO TABS: 100.0000 mg | ORAL_TABLET | Freq: Three times a day (TID) | ORAL | Status: DC

## 2020-07-18 MED ORDER — HYDROMORPHONE HCL 1 MG/ML IJ SOLN
1.0000 mg | INTRAMUSCULAR | Status: DC | PRN
  Administered 2020-07-18 – 2020-07-19 (×4): 1 mg via INTRAVENOUS
  Filled 2020-07-18 (×4): qty 1

## 2020-07-18 MED ORDER — FUROSEMIDE 10 MG/ML IJ SOLN
20.0000 mg | Freq: Once | INTRAMUSCULAR | Status: AC
  Administered 2020-07-18: 20 mg via INTRAVENOUS
  Filled 2020-07-18: qty 2

## 2020-07-18 MED ORDER — AMLODIPINE BESYLATE 10 MG PO TABS
10.0000 mg | ORAL_TABLET | Freq: Every day | ORAL | Status: DC
  Administered 2020-07-18: 10 mg via ORAL
  Filled 2020-07-18: qty 1

## 2020-07-18 MED ORDER — AMIODARONE HCL 200 MG PO TABS
200.0000 mg | ORAL_TABLET | Freq: Every day | ORAL | Status: DC
  Administered 2020-07-18: 200 mg via ORAL
  Filled 2020-07-18: qty 1

## 2020-07-18 MED ORDER — POLYVINYL ALCOHOL 1.4 % OP SOLN
1.0000 [drp] | Freq: Four times a day (QID) | OPHTHALMIC | Status: DC | PRN
  Filled 2020-07-18: qty 15

## 2020-07-18 MED ORDER — AMLODIPINE BESYLATE 10 MG PO TABS: 10.0000 mg | ORAL_TABLET | Freq: Every day | ORAL | Status: DC

## 2020-07-18 MED ORDER — CHLORHEXIDINE GLUCONATE CLOTH 2 % EX PADS: 6.0000 | MEDICATED_PAD | Freq: Every day | CUTANEOUS | Status: DC

## 2020-07-18 MED ORDER — ONDANSETRON HCL 4 MG/2ML IJ SOLN: 4.0000 mg | Freq: Four times a day (QID) | INTRAMUSCULAR | Status: DC | PRN

## 2020-07-18 MED ORDER — HYDRALAZINE HCL 50 MG PO TABS
50.0000 mg | ORAL_TABLET | Freq: Three times a day (TID) | ORAL | Status: DC
  Administered 2020-07-18: 50 mg via ORAL
  Filled 2020-07-18: qty 1

## 2020-07-18 MED ORDER — FUROSEMIDE 10 MG/ML IJ SOLN
40.0000 mg | Freq: Once | INTRAMUSCULAR | Status: AC
  Administered 2020-07-18: 40 mg via INTRAVENOUS
  Filled 2020-07-18: qty 4

## 2020-07-18 MED ORDER — HYDRALAZINE HCL 50 MG PO TABS: 50.0000 mg | ORAL_TABLET | Freq: Three times a day (TID) | ORAL | Status: DC

## 2020-07-18 MED ORDER — BIOTENE DRY MOUTH MT LIQD: 15.0000 mL | OROMUCOSAL | Status: DC | PRN

## 2020-07-18 MED ORDER — ONDANSETRON 4 MG PO TBDP: 4.0000 mg | ORAL_TABLET | Freq: Four times a day (QID) | ORAL | Status: DC | PRN

## 2020-07-18 MED ORDER — LABETALOL HCL 5 MG/ML IV SOLN
10.0000 mg | INTRAVENOUS | Status: DC | PRN
  Administered 2020-07-18: 10 mg via INTRAVENOUS
  Filled 2020-07-18: qty 4

## 2020-07-18 MED ORDER — GLYCOPYRROLATE 0.2 MG/ML IJ SOLN
0.4000 mg | Freq: Four times a day (QID) | INTRAMUSCULAR | Status: DC | PRN
  Administered 2020-07-19: 0.4 mg via INTRAVENOUS
  Filled 2020-07-18: qty 2

## 2020-07-18 MED ORDER — ENOXAPARIN SODIUM 60 MG/0.6ML IJ SOSY: 1.0000 mg/kg | PREFILLED_SYRINGE | INTRAMUSCULAR | Status: DC

## 2020-07-18 MED ORDER — LORAZEPAM 2 MG/ML IJ SOLN: 0.5000 mg | INTRAMUSCULAR | Status: DC | PRN

## 2020-07-18 NOTE — Progress Notes (Incomplete)
Paged on call Dr. Margo Aye about concern for elevated blood pressure, for which hydralazine and lasix have been ordered and given.  The patient has required several I&O catheterizations.  Requested foley catheter for strict I&O management and perhaps medication modification for elevated blood pressure.  Work of breathing has increased and heated high flow nasal cannula has been increased to 20L at 100%.

## 2020-07-18 NOTE — Progress Notes (Signed)
PROGRESS NOTE    [name]  OQH:[id number] DOB: 05/09/1935 DOA: 07/15/2020 PCP: Cassandria Anger, MD   Brief Narrative: [name] is a 85 y.o. female with a history of paroxysmal atrial fibrillation, more thrombus on Eliquis, pulm hypertension, hypothyroidism, hyperlipidemia, anxiety, significant COPD, chronic systolic heart failure.  Patient presents secondary to dizziness which is a recurrent issue.  Complicated by Eliquis use.  Patient was recently in the hospital for similar presentation.  On admission, patient found to have an avulsion fracture of right third digit in addition to hyponatremia.  Patient was transferred to higher level of care due to worsening shortness of breath.  Patient was requiring significant amount of supplemental oxygen.  Cardiology team was consulted and patient was restarted on IV Lasix 40 Mg twice daily.  Patient has continued to deteriorate.  Discussed with patient's sister, and patient was made DNR/DNI.  Palliative care team was consulted.  Goal of care is trending towards comfort directed care as per the palliative care team.  Further management will depend on above.  07/16/2020: Patient seen.  Sodium has improved to 129.  Patient is both on torsemide 40 Mg daily and 20 Mg daily.  Will discontinue torsemide 40 Mg daily and Continue torsemide 20 Mg orally daily.  We will continue to assess need for diuretics daily.  We will repeat urine sodium, urine and serum osmolality, cortisol level.  Suspect patient may have component of SIADH.  We will have low threshold to start patient on sodium chloride tablets.  No significant history from patient.  07/17/2020: Patient seen alongside patient's nurse.  Cardiology input is appreciated.  Patient's oral torsemide has been discontinued.  Cardiology has started patient on IV Lasix 40 Mg twice daily.  Apparently, patient developed mild shortness of breath overnight.  Cardiology is managing patient as a case of acute on  chronic diastolic CHF/pulmonary edema.  Pleural effusion is also noted on chest x-ray.  Patient was on amiodarone.  Amiodarone is currently on hold, as amiodarone toxicity is always a possibility.  ABGs noted, significant hypoxia with pH of 7.53.  We will check D-dimer, procalcitonin and chest CT without contrast.  Likely, patient has chronic kidney disease stage IV (baseline serum creatinine was not visualized).  Patient remains lethargic, will guarded prognosis.  Sodium has improved to 132.  07/18/2020: Patient made DNR/DNI today by the sister.  Palliative care team consulted.  Goal of care is trending towards comfort directed care.  Assessment & Plan:   Active Problems:   Fall   Hyponatremia   AF (paroxysmal atrial fibrillation) (HCC)   Chronic systolic CHF (congestive heart failure) (HCC)   Hypothyroidism   COPD (chronic obstructive pulmonary disease) (HCC)   Anxiety   Hyperlipidemia   Malnutrition of moderate degree   Acute on chronic respiratory failure with hypoxia (HCC)   SOB (shortness of breath)   Acute on chronic diastolic CHF (congestive heart failure) ([place])  Fall Non-syncopal although patient has had lightheadedness prior to falling. She has fallen multiple times in as many weeks. Patient is on multiple antihypertensives which are possibly contributing. -PT/OT recommendations: SNF  Avulsion fracture of right fourth digit In setting of fall. Patient with some pain and swelling. Discussed with orthopedic surgery who recommended splint in extension and outpatient follow-up with Dr. Caralyn Guile in 2 weeks. Use finger as tolerated. -Splint  Hyponatremia Unsure of etiology. Patient with some evidence of hypervolemia but she has had some improvement with IV fluids. Albumin also low. -Hold  IV fluids; continue torsemide -BMP in AM 07/16/2020: See above documentation.  Recurrent falls may be associated with hyponatremia. 07/17/2020: Sodium is improved to 132.  Continue current  management.  Left second toe swelling Concern for fracture. X-ray of foot is negative for acute fracture.  Acute on chronic respiratory failure with hypoxia Stable at 3 Lpm via [place] 07/17/2020: Check D-dimer.  Chest CT without contrast.    History of mural thrombus Paroxysmal atrial fibrillation Patient is currently on Eliquis 2.5 mg BID as an outpatient which was recommended by Cardiology in 06/2020. It appears metoprolol was discontinued on last admission discharge. -Continue Eliquis 2.5 mg BID -Continue home amiodarone and diltiazem -Hold Metoprolol 07/17/2020: Eliquis has been discontinued.  Patient is only on subcutaneous heparin for DVT prophylaxis.  Amiodarone is on hold.  ESR has been ordered. 07/18/2020: Elevated D-dimer.  VQ scan and Doppler ultrasound of the lower extremity planned.  Plan was to change heparin to subcutaneous Lovenox to be dosed by pharmacy (treatment dose until the results of the VQ scan is known).  Goal of care with her main further management.  Patient is now DO NOT RESUSCITATE and DO NOT INTUBATE.  Palliative care team has been consulted.  The care is trending towards comfort directed care as per palliative care team.  Primary hypertension Patient is on amlodipine, diltiazem, hydralazine, Imdur and metoprolol as an outpatient. On chart review, metoprolol appears to have been recommended for discontinuation. Possibly complicating falls if regimen is causing postural hypotension -Continue home diltiazem as mentioned above -Continue home Imdur, amlodipine, hydralazine (increase to home dose of 50 mg TID) -Hold metoprolol 07/16/2020: Continue to optimize blood pressure.  Keep systolic blood pressure around 140 mmHg.  Acute on chronic chronic systolic heart failure Last EF of 55-60% which was improved from previous EF of 35-40%. Patient is on torsemide, potassium as an outpatient. -Continue home torsemide and potassium 07/16/2020: Currently, patient has mainly diastolic  dysfunction.  Will be cautious with diuretics.  Will discontinue torsemide 40 Mg p.o. once daily.  Continue torsemide 20 Mg p.o. once daily. 07/17/2020: Cardiology is directing care.  Torsemide has been discontinued.  Patient was started on IV Lasix 40 Mg twice daily.  Check D-dimer.  Right forehead hematoma Improved from one month prior but likely slow to improve secondary to Eliquis use.  Hypothyroidism TSH of 5.552 on admission. Patient is on synthroid. Although she has hyponatremia, unsure this is the cause. With history of atrial fibrillation, and the fact that TSH is down from peak of around 7.7 in January, will opt to continue current dosing. -Continue Synthroid 25 mcg   Anxiety -Continue klonopin qhs prn  Hyperlipidemia -Continue Crestor  COPD -Continue Trelegy ellipta and albuterol prn  GERD -Continue Protonix  Overall, guarded prognosis.  Palliative care team consulted.  Patient is now DNR/DNI.    DVT prophylaxis: Subcutaneous heparin 5000 units every 8 hours. Code Status:  DNI/DNR Family Communication: None at bedside Disposition Plan: Palliative care is discussing comfort directed care.  Consultants:   Orthopedic surgery  Procedures:   None  Antimicrobials:  None    Subjective: Shortness of breath persists. Lethargic.   No history from patient.    Objective: Vitals:   07/18/20 0658 07/18/20 0726 07/18/20 0803 07/18/20 1127  BP: (!) 151/59 (!) 156/72  (!) 157/71  Pulse: 85 64  83  Resp: (!) 22 (!) 25  (!) 28  Temp:  98 F (36.7 C)  98.3 F (36.8 C)  TempSrc:  Oral  Oral  SpO2:  99% 97% 96%  Weight:      Height:        Intake/Output Summary (Last 24 hours) at 07/18/2020 1209 Last data filed at 07/18/2020 0900 Gross per 24 hour  Intake 570 ml  Output 1150 ml  Net -580 ml   Filed Weights   07/15/20 0838 07/17/20 0400 07/18/20 0500  Weight: 44.8 kg 44.4 kg 46.9 kg    Examination:  General exam: Lethargic.  Mild shortness of  breath. Respiratory: Decreased air entry.    Cardiovascular: S1 & S2, irregularly irregular.  Gastrointestinal: Obese, soft and nontender.  Organs are not palpable.   Neurologic: Patient is lethargic.  Patient moves all extremities.   Musculoskeletal: Minimal edema of the extremities.  Data Reviewed: I have personally reviewed following labs and imaging studies  CBC Lab Results  Component Value Date   WBC 11.9 (H) 07/16/2020   RBC 4.08 07/16/2020   HGB 11.2 (L) 07/16/2020   HCT 33.7 (L) 07/16/2020   MCV 82.6 07/16/2020   MCH 27.5 07/16/2020   PLT 371 07/16/2020   MCHC 33.2 07/16/2020   RDW 21.2 (H) 07/16/2020   LYMPHSABS 0.7 07/31/2020   MONOABS 1.3 (H) 07/18/2020   EOSABS 0.0 07/22/2020   BASOSABS 0.0 33/00/7622     Last metabolic panel Lab Results  Component Value Date   NA 133 (L) 07/18/2020   K 4.1 07/18/2020   CL 91 (L) 07/18/2020   CO2 32 07/18/2020   BUN 31 (H) 07/18/2020   CREATININE 1.74 (H) 07/18/2020   GLUCOSE 107 (H) 07/18/2020   GFRNONAA 29 (L) 07/18/2020   CALCIUM 8.8 (L) 07/18/2020   PHOS 4.4 07/18/2020   PROT 5.5 (L) 07/14/2020   ALBUMIN 2.6 (L) 07/18/2020   BILITOT 0.6 07/14/2020   ALKPHOS 59 07/14/2020   AST 39 07/14/2020   ALT 27 07/14/2020   ANIONGAP 10 07/18/2020    CBG (last 3)  Recent Labs    07/17/20 0456  GLUCAP 85     GFR: Estimated Creatinine Clearance: 18.5 mL/min (A) (by C-G formula based on SCr of 1.74 mg/dL (H)).  Coagulation Profile: No results for input(s): INR, PROTIME in the last 168 hours.  Recent Results (from the past 240 hour(s))  Resp Panel by RT-PCR (Flu A&B, Covid) Nasopharyngeal Swab     Status: None   Collection Time: 07/21/2020  5:51 PM   Specimen: Nasopharyngeal Swab; Nasopharyngeal(NP) swabs in vial transport medium  Result Value Ref Range Status   SARS Coronavirus 2 by RT PCR NEGATIVE NEGATIVE Final    Comment: (NOTE) SARS-CoV-2 target nucleic acids are NOT DETECTED.  The SARS-CoV-2 RNA is generally  detectable in upper respiratory specimens during the acute phase of infection. The lowest concentration of SARS-CoV-2 viral copies this assay can detect is 138 copies/mL. A negative result does not preclude SARS-Cov-2 infection and should not be used as the sole basis for treatment or other patient management decisions. A negative result may occur with  improper specimen collection/handling, submission of specimen other than nasopharyngeal swab, presence of viral mutation(s) within the areas targeted by this assay, and inadequate number of viral copies(<138 copies/mL). A negative result must be combined with clinical observations, patient history, and epidemiological information. The expected result is Negative.  Fact Sheet for Patients:  EntrepreneurPulse.com.au  Fact Sheet for Healthcare Providers:  IncredibleEmployment.be  This test is no t yet approved or cleared by the [place] FDA and  has been authorized for detection and/or diagnosis of  SARS-CoV-2 by FDA under an Emergency Use Authorization (EUA). This EUA will remain  in effect (meaning this test can be used) for the duration of the COVID-19 declaration under Section 564(b)(1) of the Act, 21 U.S.C.section 360bbb-3(b)(1), unless the authorization is terminated  or revoked sooner.       Influenza A by PCR NEGATIVE NEGATIVE Final   Influenza B by PCR NEGATIVE NEGATIVE Final    Comment: (NOTE) The Xpert Xpress SARS-CoV-2/FLU/RSV plus assay is intended as an aid in the diagnosis of influenza from Nasopharyngeal swab specimens and should not be used as a sole basis for treatment. Nasal washings and aspirates are unacceptable for Xpert Xpress SARS-CoV-2/FLU/RSV testing.  Fact Sheet for Patients: EntrepreneurPulse.com.au  Fact Sheet for Healthcare Providers: IncredibleEmployment.be  This test is not yet approved or cleared by the [place] FDA  and has been authorized for detection and/or diagnosis of SARS-CoV-2 by FDA under an Emergency Use Authorization (EUA). This EUA will remain in effect (meaning this test can be used) for the duration of the COVID-19 declaration under Section 564(b)(1) of the Act, 21 U.S.C. section 360bbb-3(b)(1), unless the authorization is terminated or revoked.  Performed at [facility], [place] [address], [place], [place] [address]         Radiology Studies: CT CHEST WO CONTRAST  Result Date: 07/17/2020 CLINICAL DATA:  Respiratory failure EXAM: CT CHEST WITHOUT CONTRAST TECHNIQUE: Multidetector CT imaging of the chest was performed following the standard protocol without IV contrast. COMPARISON:  None. FINDINGS: Cardiovascular: Aortic atherosclerosis. Cardiomegaly. Three-vessel coronary artery calcifications. Aortic valve calcifications. No pericardial effusion. Mediastinum/Nodes: No enlarged mediastinal, hilar, or axillary lymph nodes. Thyroid gland, trachea, and esophagus demonstrate no significant findings. Lungs/Pleura: Moderate centrilobular emphysema. Moderate bilateral pleural effusions and associated atelectasis or consolidation. Upper Abdomen: No acute abnormality. Musculoskeletal: No chest wall mass or suspicious bone lesions identified. IMPRESSION: 1. Moderate bilateral pleural effusions and associated atelectasis or consolidation. 2. Emphysema. 3. Coronary artery disease. 4. Aortic valve calcifications. Correlate for echocardiographic evidence of aortic valve dysfunction. Aortic Atherosclerosis (ICD10-I70.0) and Emphysema (ICD10-J43.9). Electronically Signed   By: Eddie Candle M.D.   On: 07/17/2020 13:41   DG CHEST PORT 1 VIEW  Result Date: 07/16/2020 CLINICAL DATA:  Shortness of breath. EXAM: PORTABLE CHEST 1 VIEW COMPARISON:  Most recent radiograph 07/20/2020 FINDINGS: Lordotic positioning. Volume loss with bilateral pleural effusions and likely atelectasis. Heart size is obscured. Dense  aortic atherosclerosis. Increased interstitial thickening from prior exam likely pulmonary edema. Biapical pleuroparenchymal scarring. No pneumothorax. The bones are under mineralized. IMPRESSION: Increased pulmonary edema from prior. Volume loss with bilateral pleural effusions and likely atelectasis. Electronically Signed   By: Keith Rake M.D.   On: 07/16/2020 22:52        Scheduled Meds: . amiodarone  200 mg Oral Daily  . amLODipine  10 mg Oral Daily  . aspirin  81 mg Oral Daily  . Chlorhexidine Gluconate Cloth  6 each Topical Daily  . enoxaparin (LOVENOX) injection  1 mg/kg Subcutaneous Q24H  . feeding supplement  237 mL Oral TID BM  . fluticasone furoate-vilanterol  1 puff Inhalation Daily   And  . umeclidinium bromide  1 puff Inhalation Daily  . furosemide  40 mg Intravenous BID  . hydrALAZINE  100 mg Oral Q8H  . isosorbide mononitrate  30 mg Oral Daily  . levothyroxine  25 mcg Oral Q0600  . multivitamin with minerals  1 tablet Oral Daily  . pantoprazole  40 mg Oral Daily  .  polyethylene glycol  17 g Oral Daily  . potassium chloride  40 mEq Oral BID  . rosuvastatin  10 mg Oral QHS   Continuous Infusions:    LOS: 5 days     Dana Allan MD [facility] 07/18/2020, 12:09 PM  If 7PM-7AM, please contact night-coverage [email or web address]

## 2020-07-18 NOTE — Progress Notes (Signed)
Referred pt to DR Nevada Crane thru secured message about SBP-160=200 mmhg, from 2000H to now,  Already given due hydralazine tab and PRN hydralazine IV but still SBP was the same, Dr Nevada Crane ordered stat dose of hydralazine IV and increase dose of hydralazine tab and amlodipine tab

## 2020-07-18 NOTE — Progress Notes (Signed)
Secured message sent to Dr Nevada Crane, informed about MEWS score yellow, ordered for lasix IV 20 mg stat, informed about the latest v/s, also informed about a total urine output during in and out straight cath, no further order was made

## 2020-07-18 NOTE — Progress Notes (Signed)
Spoke with DR Humphrey Rolls, cardiologist, referred v/s SBP=170--210 mm hg, RR-30-40 with high flow O2/[place] at 20 lpm Fi02=100%, already given hydralazine tab and IV as ordered by DR Nevada Crane but no response, with a total of 60 mg lasix from 2000H, initiated straight cath then now just inserted foley catheter- agreed for foley catheter and will order for another dose of hydralazine and bumex instead of lasix, if still no response plan to give labetalol IV

## 2020-07-18 NOTE — Progress Notes (Signed)
Progress Note  Patient Name: [name] Date of Encounter: 07/18/2020  [facility] Cardiologist: Kirk Ruths, MD  Subjective   Pt does not respond to questions  Inpatient Medications    Scheduled Meds: . amLODipine  10 mg Oral Daily  . aspirin  81 mg Oral Daily  . Chlorhexidine Gluconate Cloth  6 each Topical Daily  . feeding supplement  237 mL Oral TID BM  . fluticasone furoate-vilanterol  1 puff Inhalation Daily   And  . umeclidinium bromide  1 puff Inhalation Daily  . furosemide  40 mg Intravenous BID  . heparin injection (subcutaneous)  5,000 Units Subcutaneous Q8H  . hydrALAZINE  50 mg Oral Q8H  . isosorbide mononitrate  30 mg Oral Daily  . levothyroxine  25 mcg Oral Q0600  . multivitamin with minerals  1 tablet Oral Daily  . pantoprazole  40 mg Oral Daily  . polyethylene glycol  17 g Oral Daily  . potassium chloride  40 mEq Oral BID  . rosuvastatin  10 mg Oral QHS   Continuous Infusions:  PRN Meds: acetaminophen, albuterol, clonazePAM, hydrALAZINE, labetalol, melatonin, phenol, prochlorperazine   Vital Signs    Vitals:   07/18/20 0632 07/18/20 0658 07/18/20 0726 07/18/20 0803  BP: (!) 202/80 (!) 151/59 (!) 156/72   Pulse: 84 85 64   Resp: 16 (!) 22 (!) 25   Temp: 99.9 F (37.7 C)  98 F (36.7 C)   TempSrc:   Oral   SpO2: 97%  99% 97%  Weight:      Height:        Intake/Output Summary (Last 24 hours) at 07/18/2020 0942 Last data filed at 07/18/2020 0900 Gross per 24 hour  Intake 570 ml  Output 1150 ml  Net -580 ml   Last 3 Weights 07/18/2020 07/17/2020 07/15/2020  Weight (lbs) 103 lb 6.3 oz 97 lb 14.2 oz 98 lb 12.3 oz  Weight (kg) 46.9 kg 44.4 kg 44.8 kg      Telemetry    Sinus with pacs- Personally Reviewed   Physical Exam   GEN: Does not answer questions. Frail Neck: supple Cardiac: RRR  Respiratory: Diminished BS bases GI: Soft, nontender, non-distended  MS: No edema Neuro:  Nonfocal  Psych: Normal affect   Labs        Chemistry Recent Labs  Lab 07/16/2020 1749 08/03/2020 2308 07/14/20 0245 07/14/20 1051 07/16/20 0259 07/16/20 1142 07/17/20 0211 07/18/20 0158  NA 123*   < > 128*   < > 129*  --  132* 133*  K 3.5  --  2.9*   < > 4.1  --  3.5 4.1  CL 79*  --  84*   < > 87*  --  87* 91*  CO2 31  --  33*   < > 31  --  32 32  GLUCOSE 75  --  78   < > 118*  --  74 107*  BUN 21  --  22   < > 24*  --  24* 31*  CREATININE 1.62*  --  1.65*   < > 1.70*  --  1.79* 1.74*  CALCIUM 8.8*  --  8.5*   < > 9.3  --  9.0 8.8*  PROT 6.2*  --  5.5*  --   --   --   --   --   ALBUMIN 2.8*  --  2.4*  --   --  2.7* 2.6* 2.6*  AST 49*  --  39  --   --   --   --   --   ALT 27  --  27  --   --   --   --   --   ALKPHOS 69  --  59  --   --   --   --   --   BILITOT 0.8  --  0.6  --   --   --   --   --   GFRNONAA 32*  --  31*   < > 30*  --  28* 29*  ANIONGAP 13  --  11   < > 11  --  13 10   < > = values in this interval not displayed.     Hematology Recent Labs  Lab 07/14/20 0245 07/15/20 1648 07/16/20 0259  WBC 9.1 10.3 11.9*  RBC 3.62* 4.09 4.08  HGB 10.0* 11.2* 11.2*  HCT 30.2* 34.2* 33.7*  MCV 83.4 83.6 82.6  MCH 27.6 27.4 27.5  MCHC 33.1 32.7 33.2  RDW 21.4* 21.4* 21.2*  PLT 303 362 371    BNP Recent Labs  Lab 07/17/2020 2308 07/17/20 0211  BNP 2,033.7* 4,108.2*     DDimer  Recent Labs  Lab 07/17/20 1300  DDIMER 2.13*     Radiology    CT CHEST WO CONTRAST  Result Date: 07/17/2020 CLINICAL DATA:  Respiratory failure EXAM: CT CHEST WITHOUT CONTRAST TECHNIQUE: Multidetector CT imaging of the chest was performed following the standard protocol without IV contrast. COMPARISON:  None. FINDINGS: Cardiovascular: Aortic atherosclerosis. Cardiomegaly. Three-vessel coronary artery calcifications. Aortic valve calcifications. No pericardial effusion. Mediastinum/Nodes: No enlarged mediastinal, hilar, or axillary lymph nodes. Thyroid gland, trachea, and esophagus demonstrate no significant findings.  Lungs/Pleura: Moderate centrilobular emphysema. Moderate bilateral pleural effusions and associated atelectasis or consolidation. Upper Abdomen: No acute abnormality. Musculoskeletal: No chest wall mass or suspicious bone lesions identified. IMPRESSION: 1. Moderate bilateral pleural effusions and associated atelectasis or consolidation. 2. Emphysema. 3. Coronary artery disease. 4. Aortic valve calcifications. Correlate for echocardiographic evidence of aortic valve dysfunction. Aortic Atherosclerosis (ICD10-I70.0) and Emphysema (ICD10-J43.9). Electronically Signed   By: Eddie Candle M.D.   On: 07/17/2020 13:41   DG CHEST PORT 1 VIEW  Result Date: 07/16/2020 CLINICAL DATA:  Shortness of breath. EXAM: PORTABLE CHEST 1 VIEW COMPARISON:  Most recent radiograph 07/30/2020 FINDINGS: Lordotic positioning. Volume loss with bilateral pleural effusions and likely atelectasis. Heart size is obscured. Dense aortic atherosclerosis. Increased interstitial thickening from prior exam likely pulmonary edema. Biapical pleuroparenchymal scarring. No pneumothorax. The bones are under mineralized. IMPRESSION: Increased pulmonary edema from prior. Volume loss with bilateral pleural effusions and likely atelectasis. Electronically Signed   By: Keith Rake M.D.   On: 07/16/2020 22:52    Patient Profile     85 year old female with past medical history of paroxysmal atrial fibrillation, coronary artery disease, hypertension, hyperlipidemia, COPD for evaluation of acute on chronic diastolic congestive heart failure.  Patient admitted on May 10 with complaints of generalized weakness, falls and intermittent dizziness.  She was noted to be hyponatremic felt to be secondary to poor p.o. intake.  Demadex was decreased and she developed worsening pulmonary edema.  Cardiology now asked to evaluate.  Follow-up echocardiogram showed ejection fraction 50 to [id number], grade 2 diastolic dysfunction, mild biatrial enlargement, left pleural  effusion, mild mitral regurgitation, mild aortic insufficiency.    Assessment & Plan    1 acute on chronic diastolic congestive heart failure-CT shows pleural effusions; continue lasix and follow  renal function; may need to consider thoracentesis.  2 paroxysmal atrial fibrillation-patient remains in sinus rhythm.  Sed rate 9.  Resume amiodarone 200 mg daily.  Given multiple falls I have discontinued anticoagulation as I feel risk outweighs benefit.   3 hyponatremia-improved since admission. Continue to follow.  4 hypertension-given history of bradycardia will not resume Cardizem. Continue amlodipine; increase hydralazine to 100 mg TID and follow.  5 coronary artery disease-continue ASA and statin.  Would consider palliative care consult  For questions or updates, please contact [place] Please consult [email or web address] for contact info under        Signed, Kirk Ruths, MD  07/18/2020, 9:42 AM

## 2020-07-18 NOTE — Progress Notes (Signed)
ANTICOAGULATION CONSULT NOTE - Initial Consult  Pharmacy Consult for Eliquis Indication: elevated D dimer  Allergies  Allergen Reactions  . Amoxicillin Other (See Comments)    Makes go crazy  . Codeine Nausea And Vomiting  . Levaquin [Levofloxacin] Nausea And Vomiting  . Other Nausea And Vomiting    Any pain medications    Patient Measurements: Height: '5\' 4"'$  (162.6 cm) Weight: 46.9 kg (103 lb 6.3 oz) IBW/kg (Calculated) : 54.7  Vital Signs: Temp: 98 F (36.7 C) (05/15 0726) Temp Source: Oral (05/15 0726) BP: 156/72 (05/15 0726) Pulse Rate: 64 (05/15 0726)  Labs: Recent Labs    07/15/20 1648 07/16/20 0259 07/17/20 0211 07/18/20 0158  HGB 11.2* 11.2*  --   --   HCT 34.2* 33.7*  --   --   PLT 362 371  --   --   CREATININE 1.87* 1.70* 1.79* 1.74*    Estimated Creatinine Clearance: 18.5 mL/min (A) (by C-G formula based on SCr of 1.74 mg/dL (H)).   Medical History: Past Medical History:  Diagnosis Date  . Atrial fibrillation [facility])     Assessment: 85 y.o. female admitted with CHF/AKI s/p fall, h/o Afib on eliquis PTA. Eliquis 2.'5mg'$  BID continued in hospital from 5/11 to 5/13. Patient was then started on heparin 5000u SQ TID for prophylaxis. Patient is now to be started on enoxaparin at treatment dose for elevated D-dimer with workup in progress for VTE.  Ddimer 2.13 CBC Hgb 11.2, plt 371 (on 5/13)  Plan:  Discontinue heparin for DVT prophylaxis Initiate enoxaparin '1mg'$ /kg q24h for treatment F/u Scr, CBC, and s/sx bleeding F/u VTE workup    Wilson Singer, PharmD PGY1 Pharmacy Resident 07/18/2020 11:27 AM

## 2020-07-18 NOTE — Progress Notes (Signed)
Foley catheter inserted under aseptic technique, as assisted by RN Vickie, with initial output 300 ml

## 2020-07-18 NOTE — Consult Note (Signed)
Palliative Medicine Inpatient Consult Note  Reason for consult:  Goals of Care  HPI:  Per intake H&P --> 85 year old female with past medical history of paroxysmal atrial fibrillation, coronary artery disease, hypertension, hyperlipidemia, COPD for evaluation of acute on chronic diastolic congestive heart failure. Patient admitted on May 10 with complaints of generalized weakness, falls and intermittent dizziness. She was noted to be hyponatremic felt to be secondary to poor p.o. intake. Demadex was decreased and she developed worsening pulmonary edema. Cardiology now asked to evaluate. Follow-up echocardiogram showed ejection fraction 50 to [id number], grade 2 diastolic dysfunction, mild biatrial enlargement, left pleural effusion, mild mitral regurgitation, mild aortic insufficiency.   Late of care was consulted in the setting of multiple comorbidities and  Clinical Assessment/Goals of Care:  *Please note that this is a verbal dictation therefore any spelling or grammatical errors are due to the "[facility]" system interpretation.  I have reviewed medical records including EPIC notes, labs and imaging, received report from bedside RN, assessed the patient who was lying in bed, lethargic, very ill appearing.    I called patients sister, [name] to further discuss diagnosis prognosis, [place], EOL wishes, disposition and options.   I introduced Palliative Medicine as specialized medical care for people living with serious illness. It focuses on providing relief from the symptoms and stress of a serious illness. The goal is to improve quality of life for both the patient and the family.  Trany lives in [place], [place].  She is a widow.  She had 2 children who are both deceased.  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  Per her sister she has not had much enjoyment in her life since her children died.  She is a woman of faith and has been "saved" she practices within the  [employer] denomination.  Prior to hospitalization [name] had suffered a multitude of falls.  Her sister, [name] shares that she was at her sister's house daily to help her with basic activities of daily living.  [name] discusses that [name] was not doing well "at all" in her home environment.  Reviewed Shruti's chronic medical conditions inclusive of her pulmonary edema secondary to heart failure which she showed little improvement despite diuretic efforts over the last 4 days.  Lety's oxygen requirements appears to have been going up as opposed to down.  We discussed that Javeyah in addition to all of these factors remains to have poor overall oral intake.  A detailed discussion was had today regarding advanced directives -[name] shares with me that it is "just them" there are no other family members to rely on.    Concepts specific to code status, artifical feeding and hydration, continued IV antibiotics and rehospitalization was had.    Patient is DO NOT RESUSCITATE DO NOT INTUBATE CODE STATUS.  The difference between a aggressive medical intervention path  and a palliative comfort care path for this patient at this time was had.  [name] has elected to allow her sister to be comfortable.  She shares that her sister is "suffered enough" in the duration of her life and she is lived a hard life mostly as a recluse after her children died.  We talked about transition to comfort measures in house and what that would entail inclusive of medications to control pain, dyspnea, agitation, nausea, itching, and hiccups.  We discussed stopping all uneccessary measures such as blood draws, needle sticks, and frequent vital signs.  [name] is in agreement with  all of this.   Discussed the importance of continued conversation with family and their  medical providers regarding overall plan of care and treatment options, ensuring decisions are within the context of the patients values and GOCs.  Decision  Maker: [name] (sister) - [phone]  SUMMARY OF RECOMMENDATIONS   DNAR/DNI  Transition focus to comfort care  Liberalize visitation  Comfort medications per [facility]  Wean oxygen  Anticipate in-hospital death  Ongoing palliative team support  Code Status/Advance Care Planning: DNAR/DNI   Palliative Prophylaxis:   Oral care, turn every 2 hours  Additional Recommendations (Limitations, Scope, Preferences):  Comfort care   Psycho-social/Spiritual:   Desire for further Chaplaincy support:  Yes  Additional Recommendations:  Education on death and dying process   Prognosis:  Prognosis limited hours to days  Discharge Planning:  Discharge will be celestial  Vitals:   07/18/20 0803 07/18/20 1127  BP:  (!) 157/71  Pulse:  83  Resp:  (!) 28  Temp:  98.3 F (36.8 C)  SpO2: 97% 96%    Intake/Output Summary (Last 24 hours) at 07/18/2020 1214 Last data filed at 07/18/2020 0900 Gross per 24 hour  Intake 570 ml  Output 1150 ml  Net -580 ml   Last Weight  Most recent update: 07/18/2020  5:30 AM   Weight  46.9 kg (103 lb 6.3 oz)           Gen:  Frail elderly Caucasian F HEENT: (+) R forehead bruise, Dry mucous membranes CV: Regular rate and irregular rhythm  PULM: On 20LPM HFNC ABD: soft/nontender  EXT: (+) generalized edema Neuro: Lethargic  PPS: 10%   This conversation/these recommendations were discussed with patient primary care team, Dr. Marthenia Rolling  Time In: 1200 Time Out: 1310 Total Time: 70 Greater than 50%  of this time was spent counseling and coordinating care related to the above assessment and plan.  [place] Team Team Cell Phone: [phone] Please utilize secure chat with additional questions, if there is no response within 30 minutes please call the above phone number  Palliative Medicine Team providers are available by phone from 7am to 7pm daily and can be reached through the team cell phone.   Should this patient require assistance outside of these hours, please call the patient's attending physician.

## 2020-07-18 NOTE — Progress Notes (Signed)
Chaplain responded to a SCC for EOL. Pt was barely able to rouse.  Sister [name] had visited earlier. RN attested to the fact that pt's sister had told her that pt. "is saved".  With this knowledge, chaplain offered prayer bedside.  Before the prayer, pt did open her eyes and smile broadly; however, pt went back to eyes closed and not responding to voice.  Chaplain contacted pt's sister [name] via telephone.  Sister indicated that pt had been very clear that she "was ready" and does not want to go to nursing home.  Sister also said that pt and she are the very last of the family.   Thank you for this consult. Please contact our department if further support would be helpful.   Minus Liberty, [place]    07/18/20 1400  Clinical Encounter Type  Visited With Family;Patient not available  Visit Type Initial;Spiritual support  Referral From Nurse  Consult/Referral To Chaplain  Spiritual Encounters  Spiritual Needs Prayer  Stress Factors  Patient Stress Factors Loss of control;Major life changes  Family Stress Factors Exhausted;Family relationships

## 2020-07-18 NOTE — Progress Notes (Signed)
Patients hearing aids bilateral removed and sent home with sister.

## 2020-07-18 NOTE — Progress Notes (Signed)
Referred back pt to DR [place] Crane, thru secured message MEWS red, SBP=200-210 RR=30-35, bladder scan done  By NT [facility] with urine retention of 399 ml, Dr  [place] Crane ordered to give lasix 40 mg IV and to do straight cath again, reminded that pt had a total 60 mg from 1800 till 2200H, pt did not voided by her own and need to do in and out catheter, Dr Nevada Crane advised to give lasix first and in and out catheter

## 2020-07-18 NOTE — Progress Notes (Signed)
BLE venous duplex has been completed.  Results can be found under chart review under CV PROC. 07/18/2020 2:52 PM Sargent Mankey RVT, RDMS

## 2020-07-18 NOTE — TOC Initial Note (Signed)
Transition of Care [facility]) - Initial/Assessment Note    Patient Details  Name: [name] MRN: [id number] Date of Birth: 1935/11/12  [facility] CM/SW Contact:    Joanne Chars, LCSW Phone Number: 07/18/2020, 1:18 PM  Clinical Narrative:  CSW attempted to meet with pt regarding discharge plans.  Pt not able to participate, CSW contacted sister [name] about SNF recommendations.  [name] shares she just met with palliative care and pt will not be going to SNF.  Palliative note now in recommending comfort care and anticipated hospital death.  Pt previously had [facility] and [facility] services in place.                  Expected Discharge Plan:  (hospital death anticipated) Barriers to Discharge: No Barriers Identified   Patient Goals and CMS Choice Patient states their goals for this hospitalization and ongoing recovery are:: comfort      Expected Discharge Plan and Services Expected Discharge Plan:  (hospital death anticipated) In-house Referral: Clinical Social Work   Post Acute Care Choice: NA Living arrangements for the past 2 months: Apartment                                      Prior Living Arrangements/Services Living arrangements for the past 2 months: Apartment Lives with:: Self          Need for Family Participation in Patient Care: Yes (Comment) Care giver support system in place?: Yes (comment) Current home services: Home RN,Other (comment) ([facility] palliative) Criminal Activity/Legal Involvement Pertinent to Current Situation/Hospitalization: No - Comment as needed  Activities of Daily Living Home Assistive Devices/Equipment: Environmental consultant (specify type) ADL Screening (condition at time of admission) Patient's cognitive ability adequate to safely complete daily activities?: Yes Is the patient deaf or have difficulty hearing?: Yes Does the patient have difficulty seeing, even when wearing glasses/contacts?: No Does  the patient have difficulty concentrating, remembering, or making decisions?: No Patient able to express need for assistance with ADLs?: Yes Does the patient have difficulty dressing or bathing?: Yes Independently performs ADLs?: No Does the patient have difficulty walking or climbing stairs?: Yes Weakness of Legs: Both Weakness of Arms/Hands: Both  Permission Sought/Granted                  Emotional Assessment Appearance:: Appears stated age Attitude/Demeanor/Rapport: Unable to Assess Affect (typically observed): Unable to Assess Orientation: : Oriented to Self Alcohol / Substance Use: Not Applicable Psych Involvement: No (comment)  Admission diagnosis:  Hyponatremia [E87.1] Fall [W19.XXXA] Generalized weakness [R53.1] Contusion of right hand, initial encounter [A56.979Y] Patient Active Problem List   Diagnosis Date Noted  . Acute on chronic respiratory failure with hypoxia ([place])   . SOB (shortness of breath)   . Acute on chronic diastolic CHF (congestive heart failure) ([place])   . Malnutrition of moderate degree 07/15/2020  . Hyponatremia 07/14/2020  . AF (paroxysmal atrial fibrillation) ([place]) 07/14/2020  . Chronic systolic CHF (congestive heart failure) ([place]) 07/14/2020  . Hypothyroidism 07/14/2020  . COPD (chronic obstructive pulmonary disease) ([place]) 07/14/2020  . Anxiety 07/14/2020  . Hyperlipidemia 07/14/2020  . Fall 07/22/2020   PCP:  Cassandria Anger, MD Pharmacy:   [facility] [place], [address] [place] [address] Phone: [phone] Fax: [phone]  Social Determinants of Health (SDOH) Interventions    Readmission Risk Interventions No flowsheet data found.

## 2020-07-19 ENCOUNTER — Telehealth: Payer: Self-pay | Admitting: Internal Medicine

## 2020-07-20 ENCOUNTER — Ambulatory Visit: Payer: Medicare Other | Admitting: General Practice

## 2020-07-20 ENCOUNTER — Encounter (HOSPITAL_COMMUNITY): Payer: Self-pay

## 2020-07-21 NOTE — Telephone Encounter (Signed)
Card mailed 

## 2020-08-04 NOTE — Care Plan (Signed)
Accessed patient's chart d/t an audit done by management.

## 2020-08-04 NOTE — Progress Notes (Deleted)
Cardiology Clinic Note   Patient Name: [name] Date of Encounter: 07/20/2020  Primary Care Provider:  Cassandria Anger, MD Primary Cardiologist:  Kirk Ruths, MD  Patient Profile    RAFFIE FRIDMAN 85 year old female presents the clinic today for follow-up evaluation of her coronary artery disease, PAF, and essential hypertension.  Past Medical History    Past Medical History:  Diagnosis Date  . Anxiety   . Arthritis   . Atrial fibrillation ([place])   . B12 deficiency   . COPD (chronic obstructive pulmonary disease) ([place])   . Depression   . GERD (gastroesophageal reflux disease)   . Hypertension   . Hypoxia with need for home oxygen  03/14/2020  . [place] thrombus of left atrium 03/09/20 03/14/2020  . Osteoarthrosis, hand    both hands  . Peptic ulcer, unspecified site, unspecified as acute or chronic, without mention of hemorrhage, perforation, or obstruction   . PONV (postoperative nausea and vomiting)   . Vitamin D deficiency disease    Past Surgical History:  Procedure Laterality Date  . BLADDER SURGERY     Bladder tack and intestines  . CARDIOVERSION N/A 05/11/2020   Procedure: CARDIOVERSION;  Surgeon: Lelon Perla, MD;  Location: [facility] ENDOSCOPY;  Service: Cardiovascular;  Laterality: N/A;  . CORONARY/GRAFT ACUTE MI REVASCULARIZATION N/A 08/26/2019   Procedure: Coronary/Graft Acute MI Revascularization;  Surgeon: Sherren Mocha, MD;  Location: [facility] CV LAB;  Service: Cardiovascular;  Laterality: N/A;  . CYSTOCELE REPAIR    . CYSTOSCOPY WITH RETROGRADE PYELOGRAM, URETEROSCOPY AND STENT PLACEMENT Right 09/11/2016   Procedure: CYSTOSCOPY WITH RETROGRADE PYELOGRAM, URETEROSCOPY AND STENT REPLACEMENT;  Surgeon: Cleon Gustin, MD;  Location: [facility] ORS;  Service: Urology;  Laterality: Right;  . CYSTOSCOPY WITH STENT PLACEMENT Right 08/11/2016   Procedure: CYSTOSCOPY, URETERSCOPY,  RIGHT RETROGRADE WITH RIGHT STENT PLACEMENT;  Surgeon: Festus Aloe, MD;   Location: [facility] ORS;  Service: Urology;  Laterality: Right;  . SPLENECTOMY    . TEE WITHOUT CARDIOVERSION N/A 03/10/2020   Procedure: TRANSESOPHAGEAL ECHOCARDIOGRAM (TEE);  Surgeon: Josue Hector, MD;  Location: [facility] ENDOSCOPY;  Service: Cardiovascular;  Laterality: N/A;  . TEE WITHOUT CARDIOVERSION N/A 05/11/2020   Procedure: TRANSESOPHAGEAL ECHOCARDIOGRAM (TEE);  Surgeon: Lelon Perla, MD;  Location: [facility] ENDOSCOPY;  Service: Cardiovascular;  Laterality: N/A;    Allergies  Allergies  Allergen Reactions  . Amoxicillin Other (See Comments)    "makes me crazy" Has patient had a PCN reaction causing immediate rash, facial/tongue/throat swelling, SOB or lightheadedness with hypotension: No Has patient had a PCN reaction causing severe rash involving mucus membranes or skin necrosis: No Has patient had a PCN reaction that required hospitalization: No Has patient had a PCN reaction occurring within the last 10 years: No If all of the above answers are "NO", then may proceed with Cephalosporin use.   . Codeine Nausea And Vomiting  . Levaquin [Levofloxacin In D5w] Nausea And Vomiting  . Other Nausea And Vomiting    Pt does not want to take any pain meds    History of Present Illness    [name] has a PMH of essential hypertension, paroxysmal atrial fibrillation (not on anticoagulation-patient refused), syncope, varicose veins of both lower extremities, CAD status post PCA, COPD with chronic bronchitis, acute sinusitis, CKD stage III, and emphysema.  She is admitted to the hospital on 08/26/2019 for acute lateral STEMI and underwent PTCA of the first diagonal which was100 %percent stenosed (reduced to 50%) no PCI was done  due to the small caliber of the vessel and minimal downstream blood flow.  She had been in usual state of health until the morning of presentation when she developed severe substernal chest pain with nausea and weakness. She indicated that she felt ill throughout the morning  and eventually called EMS. Her EKG on arrival demonstrated lateral STEMI and a code STEMI was activated. Upon arrival to the emergency department she continued to have discomfort in her chest, felt lightheaded, and nauseous. She indicated that she had some shortness of breath as well. However, she has known COPD. Echocardiogram showed normal LVEF with suboptimal view for wall motion assessments, mild LVH and no valvular disease. Her creatinine was elevated but appeared to be at her more recent baseline of 1.5-2.0. Her creatinine on 08/27/2019 was 1.82. Peak high-sensitivity troponins 3863. Her LDL 154. Hemoglobin A1c 6.3. While she was ambulating with cardiac rehab her oxygen saturation dropped to 87% therefore she was discharged on supplemental oxygen, 3 L nasal cannula.  She presentedto the clinic 7/7/2021for follow-up evaluation and statedshe didnot want to be on clopidogrel. She feltthat being on blood thinner wastoo high of a risk. It was explained that she neededthis medication to keep her diagonal lesion PTCA open. She indicatedthat she hadoccasional palpitations at night when laying down to go to sleep. On exam her heart rate and rhythmwere regular. I increasedher metoprolol to 25 mg twice daily due to elevated blood pressure and plannedher follow-up with me in 1 month for reevaluation.   She presented to the emergency department on 09/12/2019 with complaints of shortness of breath and elevated blood pressure. Her echocardiogram showed low normal LV function. Her Lopressor was increased to 50 mg twice daily. She was instructed to weigh herself daily. Her creatinine has slightly increased from 1.79-1.83.  She was seen in follow-up by Dr. Stanford Breed on 10/28/2019 for her proximal atrial fibrillation. She again refused anticoagulation. She indicated that she had DOE, orthopnea and bilateral lower extremity edema. She denied chest pain syncope. Furosemide was added back  into her medication regimen for lower extremity edema.  She presented to the clinic 11/05/2019 for follow-up evaluation and statedher breathing had not improved much. She stated that her shoes were fitting much better and her pants /clothes were fitting much better. However, she was concerned that she was losing so much weight. She stated that she was drinking 15 cups of water per day and eating 5-6 times per day. She did not understand why she was still losing weight. We discussed the importance of limiting her fluid and weighing herself daily. She expressed understanding that the fluid would be impacting her breathing. I  continued her current dose of furosemide, had her restrict her fluids, continued to weigh daily, avoid salt in her diet and planned follow-up as scheduled with Dr. Stanford Breed.  She contacted the nurse triage line on 11/11/2019 and indicated that she had continued shortness of breath and that her heart was out of rhythm.  She indicated that she was unable to do daily activities and felt awful.  She presented to the clinic 11/14/2019 and stated she had reduced her fluid consumption to 64 ounces daily. Her weight had maintained at 116 pounds. She stated that she felt her heart going in and out of rhythm and felt like she was increasingly short of breath. She had been prescribed home oxygen which she stated she did not like to use. She only used her home oxygen when she felt bad. We discussed the need  for her oxygen with her COPD. She expressed understanding. However, she stated she did not wish to bring her oxygen out of her house. I have asked her to follow-up with her PCP and her oxygen prescriber so that she may obtain portable oxygen. She stated that she felt much better when she was on her flecainide. We  reviewed her EKG and I showed her that she continued to be in normal sinus rhythm.  She was noted to have recurrent atrial fibrillation 12/21.  She was also noted to have  symptomatic worsening CHF symptoms.  TEE 3/22 showed an EF of 45-50%, severe left atrial enlargement, mild right atrial enlargement, moderate mitral regurgitation, and mild aortic insufficiency.  She underwent successful DCCV.  She did have difficulties with compliance.  She was seen in follow-up by Dr. Stanford Breed on 06/22/2020.  During that time she described dyspnea on exertion but no orthopnea, PND, lower extremity edema, chest pain or syncope.  She was seen by Dr. Marlou Porch on 06/28/2020 for cardiology consult.  She suffered an unwitnessed fall at home.  When EMS arrived at her home and she was found to have broken glass on the floor.  She had a laceration and hematoma on her head.  She did not remember the fall.  Due to her multiple cardiac problems cardiology was consulted.  During her exam she reported she did not remember details of her fall.  She felt she may have tripped on her oxygen tubing.  She denied palpitations, chest pain, dyspnea prior to her fall and no other cardiac complaints.  She denied/refused SNF placement and reported she wanted to return home.   Today she denies chest pain,increasedshortness of breath, lower extremity edema, fatigue, palpitations, melena, hematuria, hemoptysis, diaphoresis, weakness, presyncope, syncope, orthopnea, and PND.  Home Medications    Prior to Admission medications   Medication Sig Start Date End Date Taking? Authorizing Provider  amiodarone (PACERONE) 200 MG tablet Take 1 tablet (200 mg total) by mouth daily. Patient taking differently: No sig reported 04/27/20   Almyra Deforest, PA  apixaban (ELIQUIS) 2.5 MG TABS tablet Take 1 tablet (2.5 mg total) by mouth 2 (two) times daily. 07/02/20   Hosie Poisson, MD  Cholecalciferol 25 MCG (1000 UT) capsule Take 1,000 Units by mouth daily.    [provider]  clonazePAM (KLONOPIN) 0.5 MG tablet Take 1 tablet (0.5 mg total) by mouth at bedtime as needed (leg cramps). TAKE 1 TABLET BY MOUTH AT BEDTIME AS  NEEDED FOR LEG CRAMPS 03/14/20   Isaiah Serge, NP  docusate sodium (COLACE) 100 MG capsule Take 1 capsule (100 mg total) by mouth daily as needed for mild constipation. 03/14/20   Isaiah Serge, NP  Fluticasone-Umeclidin-Vilant (TRELEGY ELLIPTA) 100-62.5-25 MCG/INH AEPB Inhale 1 Inhaler into the lungs daily. 12/03/19   Plotnikov, Evie Lacks, MD  hydrALAZINE (APRESOLINE) 50 MG tablet Take 0.5 tablets (25 mg total) by mouth 3 (three) times daily. 07/01/20 09/29/20  Lelon Perla, MD  isosorbide mononitrate (IMDUR) 30 MG 24 hr tablet Take 1 tablet (30 mg total) by mouth daily. 06/25/20   Minus Breeding, MD  levalbuterol [facility] HFA) 45 MCG/ACT inhaler Inhale 2 puffs into the lungs every 4 (four) hours as needed for wheezing or shortness of breath. 04/16/20   Margaretha Seeds, MD  levothyroxine (SYNTHROID) 25 MCG tablet Take 25 mcg by mouth every morning. 03/30/20   [provider]  nitroGLYCERIN (NITROSTAT) 0.4 MG SL tablet Place 1 tablet (0.4 mg total) under  the tongue every 5 (five) minutes as needed for chest pain. 08/28/19 08/27/20  Kathyrn Drown D, NP  pantoprazole (PROTONIX) 40 MG tablet Take 1 tablet (40 mg total) by mouth daily. 03/25/20   Lelon Perla, MD  polyethylene glycol (MIRALAX / GLYCOLAX) 17 g packet Take 17 g by mouth daily as needed for mild constipation. 06/28/20   Hosie Poisson, MD  potassium chloride (KLOR-CON) 10 MEQ tablet Take 1 tablet (10 mEq total) by mouth daily. Patient taking differently: Take 10 mEq by mouth 2 (two) times daily. 03/25/20   Lelon Perla, MD  Propylene Glycol (SYSTANE BALANCE) 0.6 % SOLN Place 1 drop into both eyes 2 (two) times daily as needed (dry eyes).    [provider]  rosuvastatin (CRESTOR) 10 MG tablet Take 1 tablet (10 mg total) by mouth daily. 07/01/20   Lelon Perla, MD  torsemide (DEMADEX) 20 MG tablet Take 40 mg (2 tablets) in the morning and 20 mg (one tablet) in the evening 06/25/20   Minus Breeding, MD    Family  History    Family History  Problem Relation Age of Onset  . Heart disease Father   . Arthritis Other        Family history of  . Coronary artery disease Other        Family history of  . Hypertension Other        Family history of  . Diabetes Daughter        39 died   She indicated that her mother is deceased. She indicated that her father is deceased. She indicated that her maternal grandmother is deceased. She indicated that her maternal grandfather is deceased. She indicated that her paternal grandmother is deceased. She indicated that her paternal grandfather is deceased. She indicated that the status of her daughter is unknown. She indicated that the status of her other is unknown.  Social History    Social History   Socioeconomic History  . Marital status: Divorced    Spouse name: Not on file  . Number of children: 2  . Years of education: Not on file  . Highest education level: Not on file  Occupational History  . Occupation: Airline pilot: [employer]  Tobacco Use  . Smoking status: Former Smoker    Packs/day: 1.00    Types: Cigarettes    Quit date: 06/05/2008    Years since quitting: 12.3  . Smokeless tobacco: Never Used  . Tobacco comment: Pt unsure how many years she has been smoking   Vaping Use  . Vaping Use: Never used  Substance and Sexual Activity  . Alcohol use: No  . Drug use: No  . Sexual activity: Not Currently  Other Topics Concern  . Not on file  Social History Narrative   Retired, works part time   Divorced   Former Smoker   1- Son alcoholic abusive   Daughter died in 06/06/06   Social Determinants of Health   Financial Resource Strain: High Risk  . Difficulty of Paying Living Expenses: Very hard  Food Insecurity: Food Insecurity Present  . Worried About Charity fundraiser in the Last Year: Sometimes true  . Ran Out of Food in the Last Year: Sometimes true  Transportation Needs: No Transportation Needs  . Lack of Transportation (Medical): No   . Lack of Transportation (Non-Medical): No  Physical Activity: Unknown  . Days of Exercise per Week: Patient refused  . Minutes of Exercise per  Session: Not on file  Stress: No Stress Concern Present  . Feeling of Stress : Not at all  Social Connections: Not on file  Intimate Partner Violence: Not on file     Review of Systems    General:  No chills, fever, night sweats or weight changes.  Cardiovascular:  No chest pain, dyspnea on exertion, edema, orthopnea, palpitations, paroxysmal nocturnal dyspnea. Dermatological: No rash, lesions/masses Respiratory: No cough, dyspnea Urologic: No hematuria, dysuria Abdominal:   No nausea, vomiting, diarrhea, bright red blood per rectum, melena, or hematemesis Neurologic:  No visual changes, wkns, changes in mental status. All other systems reviewed and are otherwise negative except as noted above.  Physical Exam    VS:  There were no vitals taken for this visit. , BMI There is no height or weight on file to calculate BMI. GEN: Well nourished, well developed, in no acute distress. HEENT: normal. Neck: Supple, no JVD, carotid bruits, or masses. Cardiac: RRR, no murmurs, rubs, or gallops. No clubbing, cyanosis, edema.  Radials/DP/PT 2+ and equal bilaterally.  Respiratory:  Respirations regular and unlabored, clear to auscultation bilaterally. GI: Soft, nontender, nondistended, BS + x 4. MS: no deformity or atrophy. Skin: warm and dry, no rash. Neuro:  Strength and sensation are intact. Psych: Normal affect.  Accessory Clinical Findings    Recent Labs: 03/13/2020: B Natriuretic Peptide 3,757.7 06/14/2020: TSH 4.55 06/26/2020: Magnesium 1.8 06/27/2020: ALT 23 06/28/2020: BUN 31; Creatinine, Ser 2.11; Hemoglobin 10.6; Platelets 241; Potassium 3.7; Sodium 134   Recent Lipid Panel    Component Value Date/Time   CHOL 239 (H) 08/26/2019 1240   TRIG 155 (H) 08/26/2019 1240   HDL 54 08/26/2019 1240   CHOLHDL 4.4 08/26/2019 1240   VLDL 31  08/26/2019 1240   LDLCALC 154 (H) 08/26/2019 1240   LDLDIRECT 144.3 01/31/2011 0905    ECG personally reviewed by me today- *** - No acute changes  EKG 11/14/2019 normal sinus rhythm septal infarct undetermined age lateral infarct undetermined age 57 bpm- No acute changes  EKG6/25/2021 Sinus rhythm with first-degree AV block septal infarct undetermined age 73 bpm  Echocardiogram6/23/2021 IMPRESSIONS    1. Left ventricular ejection fraction, by estimation, is 60 to 65%. The  left ventricle has normal function. Left ventricular endocardial border  not optimally defined to evaluate regional wall motion. There is mild  concentric left ventricular  hypertrophy. Left ventricular diastolic function could not be evaluated.  2. Right ventricular systolic function is normal. The right ventricular  size is normal.  3. Left atrial size was moderately dilated.  4. The mitral valve is normal in structure. No evidence of mitral valve  regurgitation. No evidence of mitral stenosis.  5. The aortic valve is tricuspid. Aortic valve regurgitation is mild.  Echocardiogram 06/27/2020 1. Left ventricular ejection fraction, by estimation, is 55 to 60%. The  left ventricle has normal function. The left ventricle has no regional  wall motion abnormalities. There is mild left ventricular hypertrophy.  Left ventricular diastolic parameters  are indeterminate.  2. Right ventricular systolic function is mildly reduced. The right  ventricular size is mildly enlarged.  3. Left atrial size was severely dilated.  4. Right atrial size was moderately dilated.  5. The mitral valve is normal in structure. Mild mitral valve  regurgitation. No evidence of mitral stenosis.  6. The aortic valve has an indeterminant number of cusps. Aortic valve  regurgitation is trivial. No aortic stenosis is present.  7. The inferior vena cava is normal in  size with greater than 50%  respiratory variability,  suggesting right atrial pressure of 3 mmHg.  Cardiac catheterization 08/26/2019  1st Diag lesion is 100% stenosed.  Balloon angioplasty was performed using a BALLOON SAPPHIRE 2.0X12.  Post intervention, there is a 50% residual stenosis.  Mid LAD lesion is 30% stenosed.  1. Total occlusion of the superior branch of the first diagonal with contrast staining, consistent with infarct vessel, treated with balloon angioplasty alone due to small vessel size and poor distal runoff 2. Calcified coronary arteries with no significant obstructive disease in the left main, left circumflex, LAD proper, or RCA 3. Minimally elevated LVEDP  Plan: ICU care, add clopidogrel when nausea subsides, risk reduction measures as tolerated. Unclear whether this is a de novo plaque rupture versus an embolic event. Patient does have known paroxysmal atrial fibrillation and has refused anticoagulation. Diagnostic Dominance: Right  Intervention   Echocardiogram 09/16/2019 IMPRESSIONS    1. Left ventricular ejection fraction, by estimation, is 50 to 55%. The  left ventricle has low normal function. The left ventricle has no regional  wall motion abnormalities. There is mild concentric left ventricular  hypertrophy. Left ventricular  diastolic function could not be evaluated.  2. Right ventricular systolic function is normal. The right ventricular  size is normal. Tricuspid regurgitation signal is inadequate for assessing  PA pressure.  3. Left atrial size was mildly dilated.  4. The mitral valve is normal in structure. Trivial mitral valve  regurgitation. No evidence of mitral stenosis.  5. The aortic valve is tricuspid. Aortic valve regurgitation is mild. No  aortic stenosis is present.  6. The inferior vena cava is dilated in size with <50% respiratory  variability, suggesting right atrial pressure of 15 mmHg.    Assessment & Plan   1.  Syncope and collapse- denies episodes of  dizziness or presyncope.  Reports she feels like her previous fall was related to tripping over her oxygen tubing.  Cardiac event monitor results unavailable. Continue to monitor Will reach out to Dr. Stanford Breed for results.   Acute on chronic diastolic CHF-continues to be euvolemic today. Weight today116.4 pounds and stable.   Echocardiogram 09/16/2019 showed LVEF of [id number], diastolic parameters could not be evaluated, mild left atrial dilation, trivial mitral valve regurgitation mild aortic valve regurgitation, and elevated right atrial pressures.Denies dizziness, presyncope, syncope Continue torsemide Heart healthy low-sodium diet-salty 6 given-reviewed Increase physical activity as tolerated-instructed to use walker for safety reasons, not stability reasons.-Reviewed Continue daily weights-contact office with a weight increase of 3 pounds overnight or 5 pounds in 1 week-compliant with daily weights. Continue fluid restriction  Coronary artery disease-no chest pain today. Lateral wall status post percutaneous coronary angioplasty to diagonal branch 08/26/2019. Echocardiogram showed LVEF 60-65%, suboptimal view for wall motion assessment, mild LVH, no valvular disease Continueaspirin, Plavix, atorvastatin, amlodipine,metoprolol, Imdurnitroglycerin Heart healthy low-sodium diet-salty 6 given Increase physical activity as tolerated  PAF-heart rate today regular ***78 bpm. Denies recent episodes of palpitations. Continuemetoprolol, apixaban Heart healthy low-sodium diet-salty 6 given Increase physical activity as tolerated  Essential hypertension-BP today***130/70.well-controlled at home. Holding ACE/ARB due to renal function. Continueamlodipine Heart healthy low-sodium diet-salty 6 given Increase physical activity as tolerated  Hyperlipidemia-goal less than 70. Low-dose rosuvastatin prescribed due to advanced age. Continue rosuvastatin 20 mg daily Heart healthy  low-sodium high-fiber diet Increase physical activity as tolerated  COPD with chronic bronchitis-no increased work of breathing. Continues tonot use home oxygen.States that she does not use home oxygen but was prescribed this. 97% on  room air Follow-up with pulmonary/PCP  Disposition: Follow-up withDr. Stanford Breed in 3 months.    Jossie Ng. Layce Sprung NP-C    14-Aug-2020, 9:44 AM [place] [address] Office [phone] Fax [phone]  Notice: This dictation was prepared with Dragon dictation along with smaller phrase technology. Any transcriptional errors that result from this process are unintentional and may not be corrected upon review.  I spent***minutes examining this patient, reviewing medications, and using patient centered shared decision making involving her cardiac care.  Prior to her visit I spent greater than 20 minutes reviewing her past medical history,  medications, and prior cardiac tests.

## 2020-08-04 NOTE — Progress Notes (Signed)
Attending notified of patient expiration.

## 2020-08-04 NOTE — Discharge Summary (Signed)
Physician Discharge Summary  [name] [id number] DOB: Mar 21, 1935 DOA: 2020/08/07  PCP: Cassandria Anger, MD  Admit date: 08-07-2020 Died on: 08/13/20.   Diagnoses:    Acute on chronic systolic heart failure.   Pulmonary Hypertension.   Hypertension.   Acute on chronic respiratory failure with hypoxia.   History of mural thrombus   Fall   Avulsion fracture of right fourth finger.   Hyponatremia   AF (paroxysmal atrial fibrillation) (HCC)   Chronic systolic CHF (congestive heart failure) (HCC)   Hypothyroidism   COPD (chronic obstructive pulmonary disease) (HCC)   Anxiety   Hyperlipidemia   Malnutrition of moderate degree   Acute on chronic respiratory failure with hypoxia (HCC)   SOB (shortness of breath)   Acute on chronic diastolic CHF (congestive heart failure) ([place])  Patient was not 85 year old female with past medical history significant for paroxysmal atrial fibrillation, mural thrombus on Eliquis, pulmonary hypertension, hypothyroidism, hyperlipidemia, anxiety, significant COPD and chronic systolic heart failure.  Patient presented with dizziness, noted to a recurrent issue, hyponatremia and avulsion fracture of right fourth finger.  During the hospital stay, patient developed worsening shortness of breath and was transferred to higher level of care.  Patient was requiring significant amount of supplemental oxygen.  Cardiology team was consulted to assist with the management of CHF exacerbation.  Despite aggressive treatment, patient continued to deteriorate.  After extensive discussion with the patient's sister, patient was made DNR/DNI and, Palliative care team was consulted.  Goal of care was eventually changed to comfort directed care.  Patient died on August 13, 2020.    Signed:  Dana Allan, MD  [facility] Pager #: [phone] 7PM-7AM contact night coverage as above

## 2020-08-04 NOTE — Progress Notes (Signed)
   Palliative Medicine Inpatient Follow Up Note  Upon morning chart review identified that patient had passed away early this morning (At 6:09).    Palliative care will remain available if additional support is needed by nursing or patient's family.  No Charge  [place] Team Team Cell Phone: [phone] Please utilize secure chat with additional questions, if there is no response within 30 minutes please call the above phone number  Palliative Medicine Team providers are available by phone from 7am to 7pm daily and can be reached through the team cell phone.  Should this patient require assistance outside of these hours, please call the patient's attending physician.

## 2020-08-04 NOTE — Telephone Encounter (Signed)
Patients sister called and wanted to let Dr. Alain Marion know that the patient had passed away. Please advise

## 2020-08-04 NOTE — Progress Notes (Addendum)
Patient expired at 0609 - verified with another RN. Family, MD, and [place] made aware. [name] (sister) stated she will arrive at about 0900. IV and Foley removed.   Per Dr. Sidney Ace, RN will need to inform Attending - This RN will inform Day RN

## 2020-08-04 DEATH — deceased

## 2020-09-20 ENCOUNTER — Ambulatory Visit: Payer: Medicare Other | Admitting: Internal Medicine

## 2021-08-08 ENCOUNTER — Other Ambulatory Visit: Payer: Self-pay | Admitting: Cardiology

## 2021-10-10 IMAGING — DX DG FOOT COMPLETE 3+V*L*
3 series · 3 of 3 positions shown · non-contrast
Comparison: None.

CLINICAL DATA: Left foot pain

EXAM:
LEFT FOOT - COMPLETE 3+ VIEW

[foot ap]
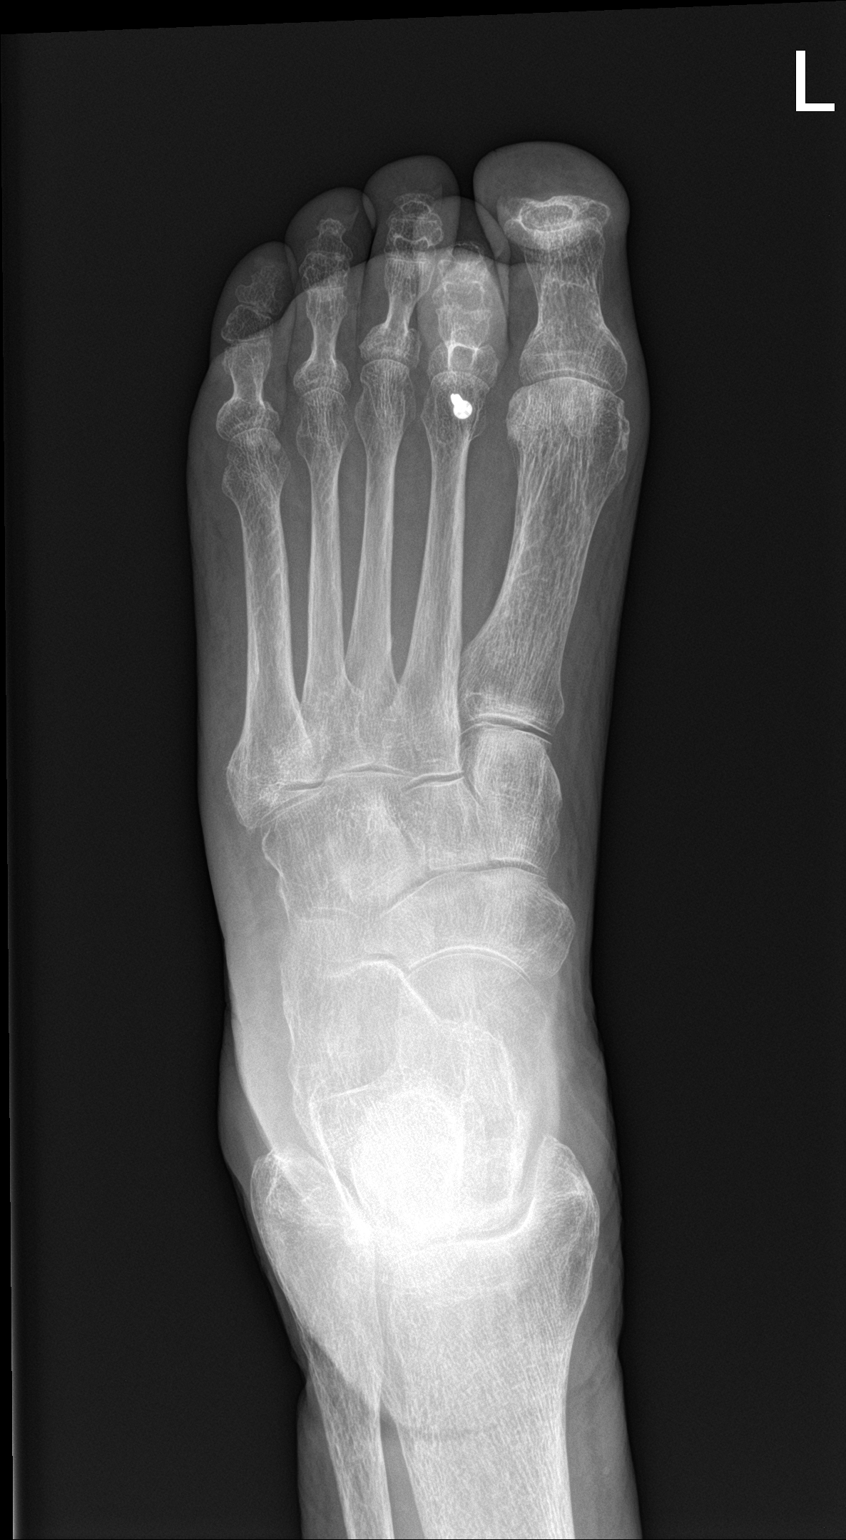

[foot obl]
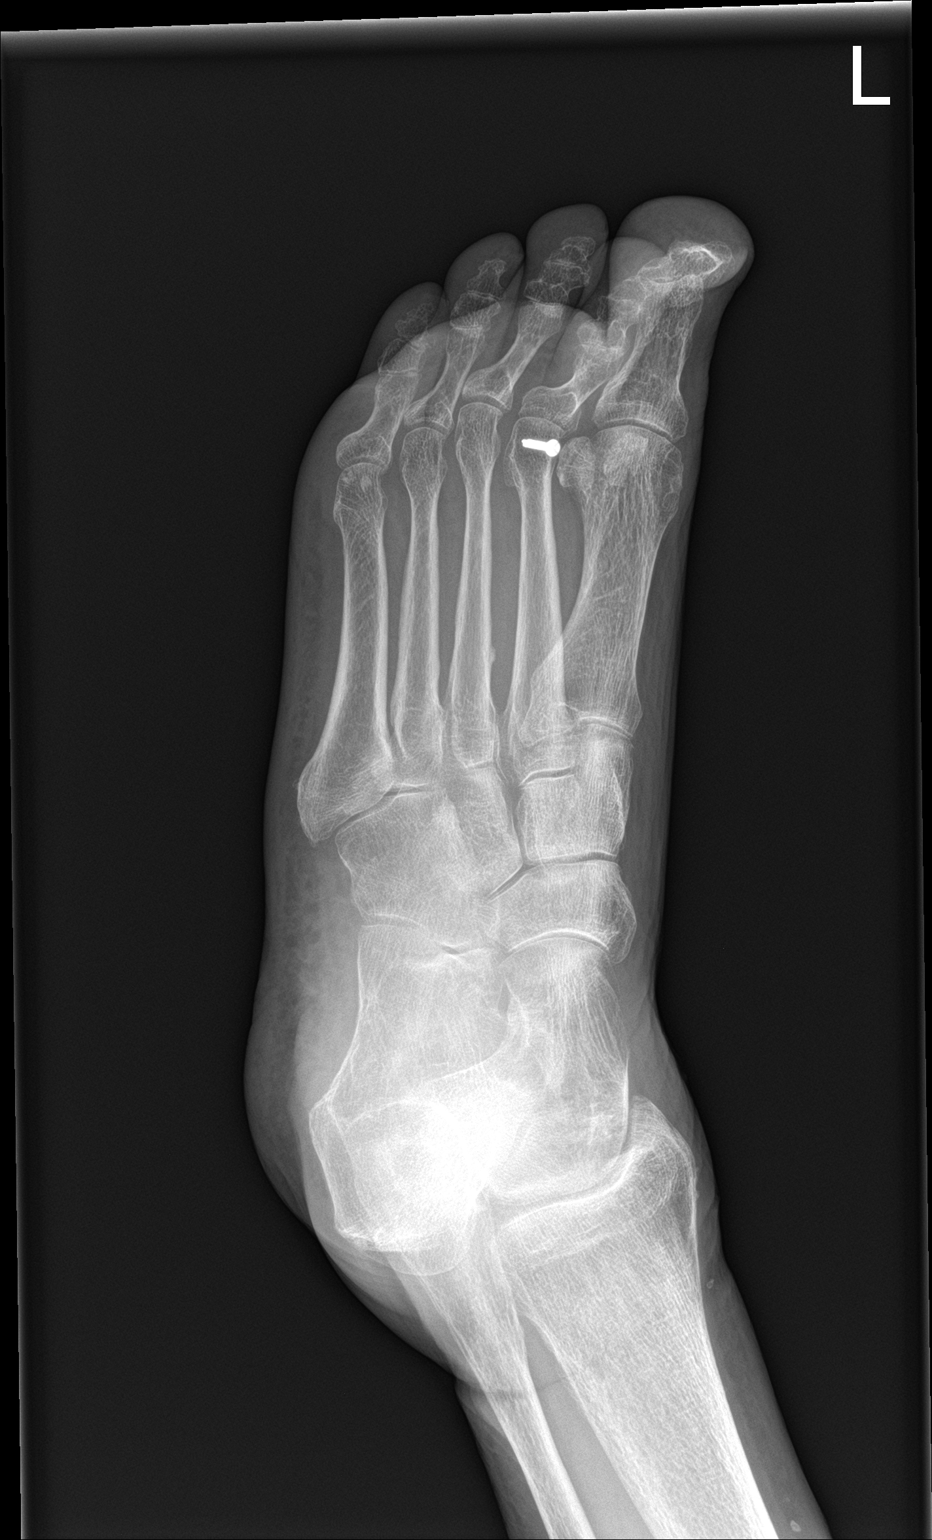

[foot lat]
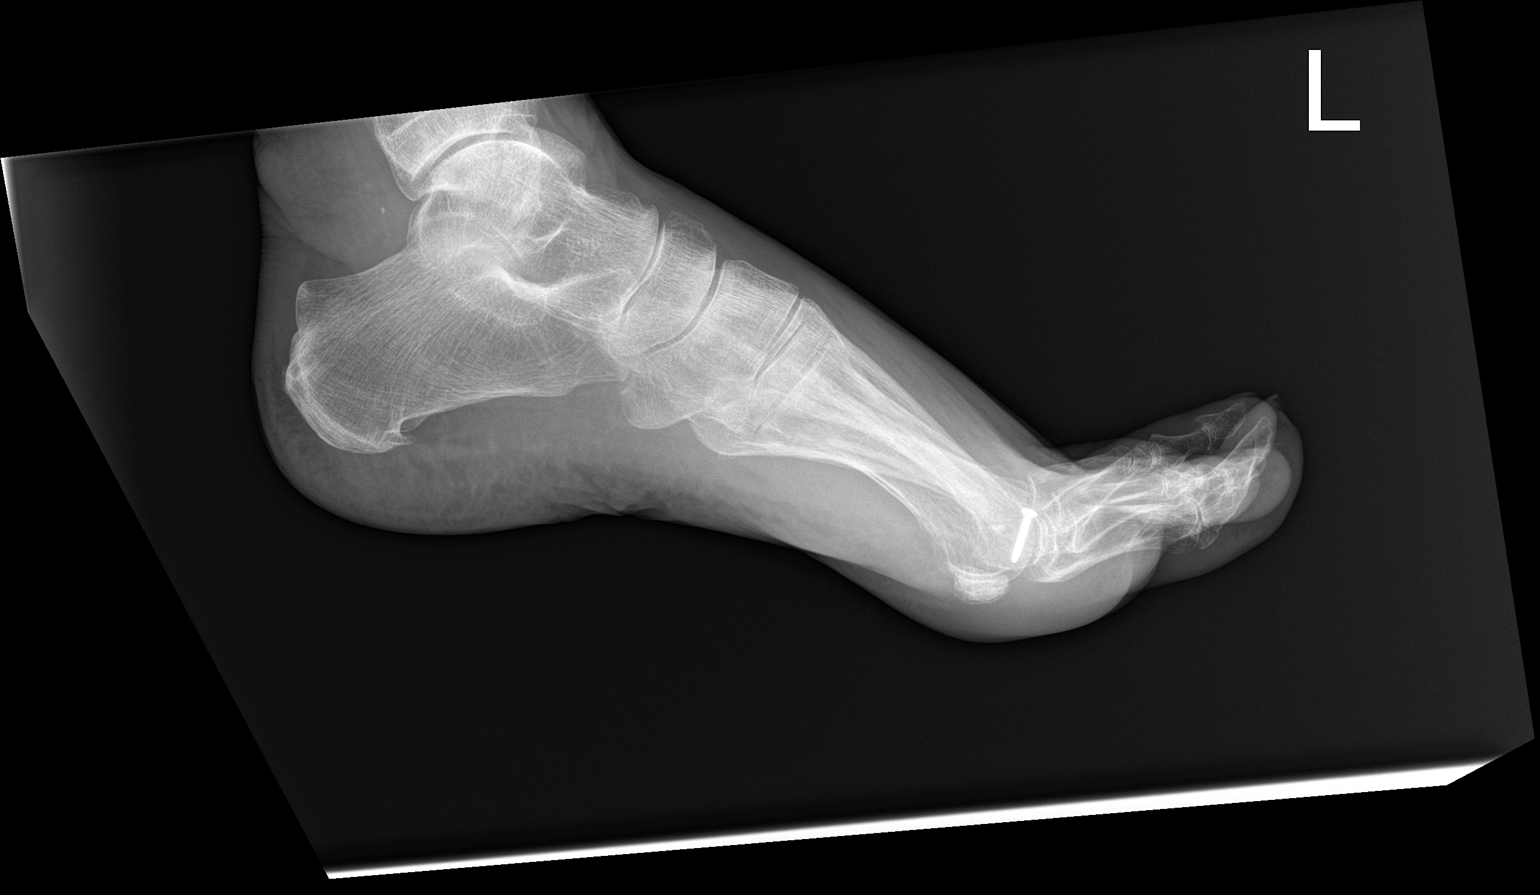

[3 of 3 positions shown; findings below may reference images not displayed]

FINDINGS: Postsurgical changes are noted in the head of the second metatarsal.
No acute fracture or dislocation is noted. No soft tissue
abnormality is seen. Calcaneal spurring is noted.
IMPRESSION: Postsurgical change without acute abnormality.

## 2021-10-14 IMAGING — DX DG CHEST 1V PORT
1 series · 1 of 1 positions shown · non-contrast
Comparison: 07/16/2020

CLINICAL DATA: Hypoxia, lethargy

EXAM:
PORTABLE CHEST 1 VIEW

[chest]
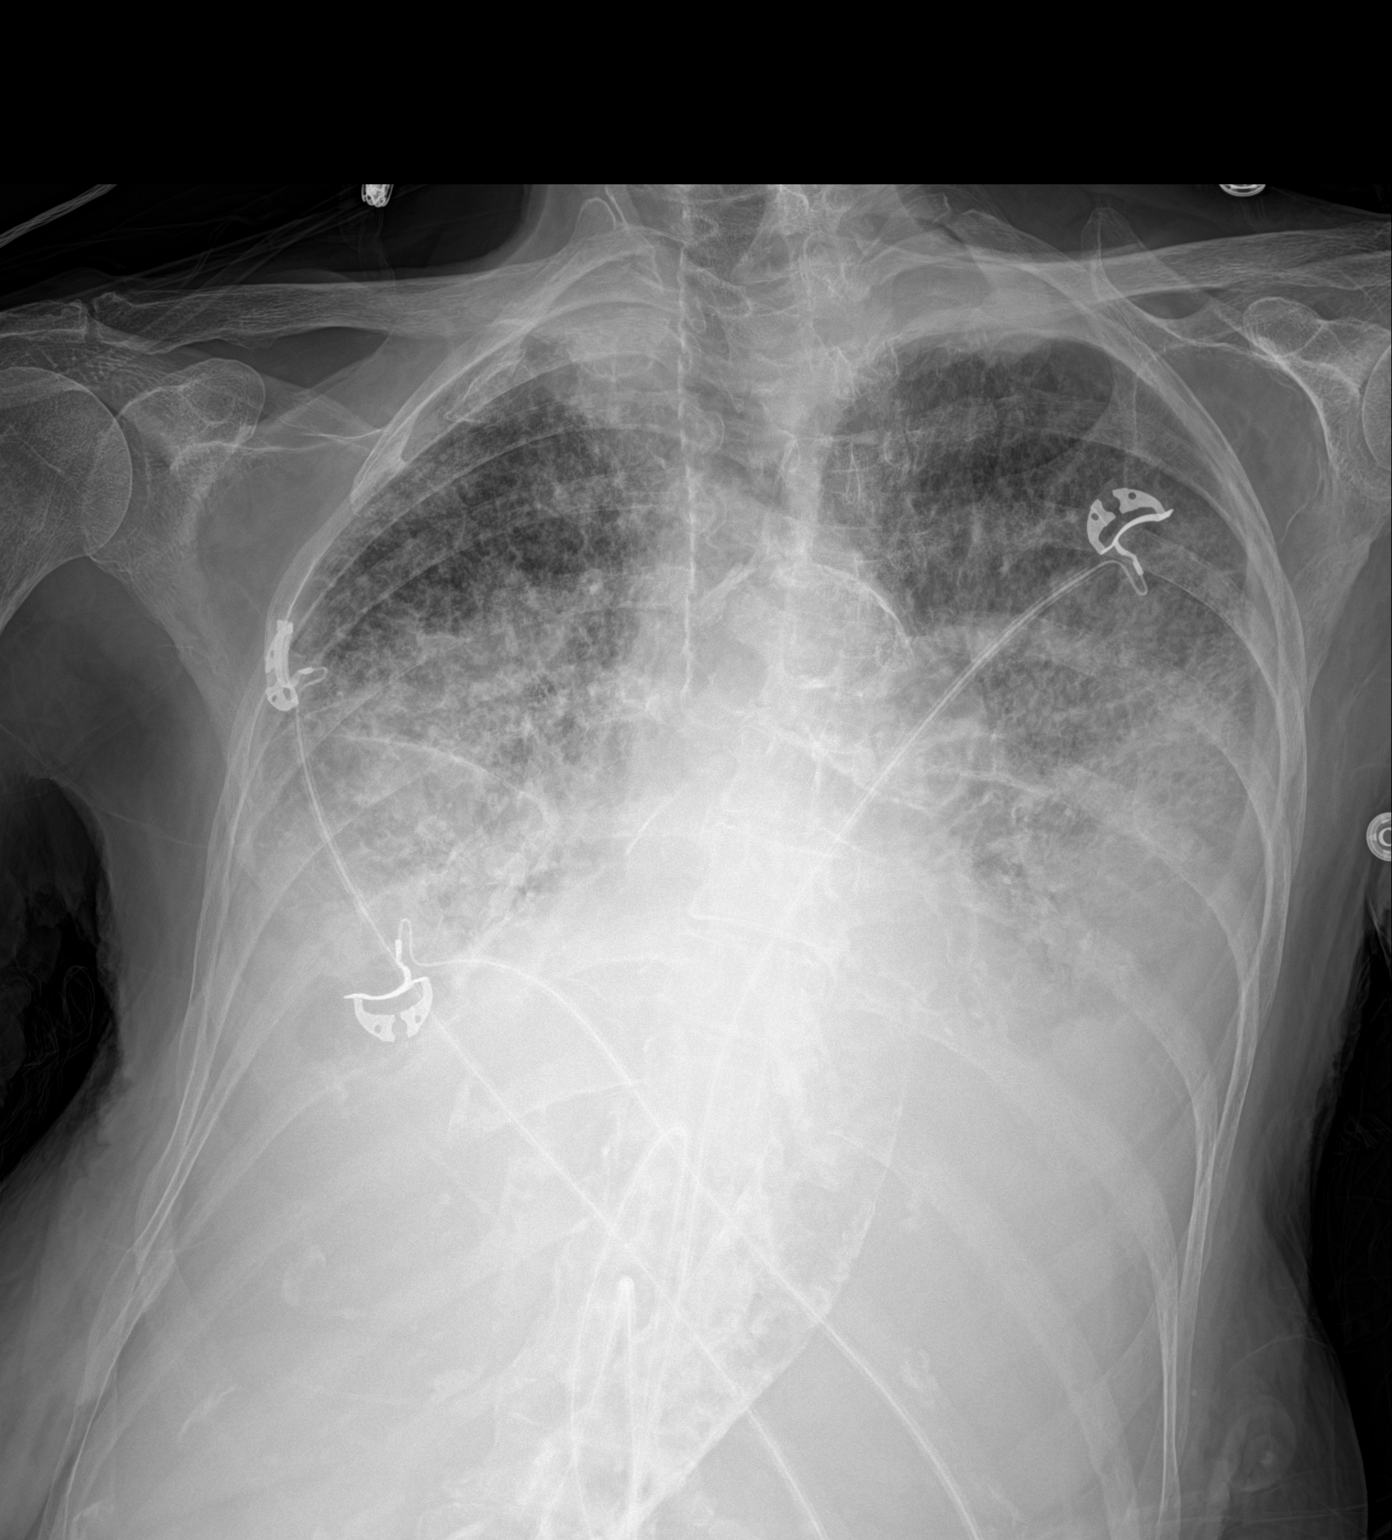

[1 of 1 positions shown; findings below may reference images not displayed]

FINDINGS: Heart size is enlarged, partially obscured by moderate bilateral
pleural effusions. Associated bibasilar opacities, likely
atelectasis. Extensive severe aortic atherosclerosis. Progressive
diffuse interstitial opacities. No pneumothorax.
IMPRESSION: Moderate-sized bilateral pleural effusions with progressive diffuse
interstitial opacities suggestive of worsening pulmonary edema.

## 2023-10-09 NOTE — Progress Notes (Signed)
 This encounter was created in error - please disregard.
